# Patient Record
Sex: Male | Born: 1945
Health system: Southern US, Community
[De-identification: ages and names within clinical notes are randomized; demographics above are authoritative.]

## PROBLEM LIST (undated history)

## (undated) DIAGNOSIS — E118 Type 2 diabetes mellitus with unspecified complications: Secondary | ICD-10-CM

## (undated) DIAGNOSIS — N183 Chronic kidney disease, stage 3 unspecified: Secondary | ICD-10-CM

## (undated) DIAGNOSIS — I480 Paroxysmal atrial fibrillation: Secondary | ICD-10-CM

## (undated) DIAGNOSIS — Z2239 Carrier of other specified bacterial diseases: Secondary | ICD-10-CM

## (undated) DIAGNOSIS — I679 Cerebrovascular disease, unspecified: Secondary | ICD-10-CM

## (undated) DIAGNOSIS — R9431 Abnormal electrocardiogram [ECG] [EKG]: Secondary | ICD-10-CM

## (undated) DIAGNOSIS — N411 Chronic prostatitis: Secondary | ICD-10-CM

## (undated) DIAGNOSIS — R2681 Unsteadiness on feet: Secondary | ICD-10-CM

## (undated) DIAGNOSIS — I10 Essential (primary) hypertension: Secondary | ICD-10-CM

## (undated) DIAGNOSIS — F17200 Nicotine dependence, unspecified, uncomplicated: Secondary | ICD-10-CM

## (undated) DIAGNOSIS — G8929 Other chronic pain: Secondary | ICD-10-CM

## (undated) DIAGNOSIS — N390 Urinary tract infection, site not specified: Secondary | ICD-10-CM

## (undated) DIAGNOSIS — M545 Low back pain, unspecified: Secondary | ICD-10-CM

## (undated) DIAGNOSIS — I2510 Atherosclerotic heart disease of native coronary artery without angina pectoris: Secondary | ICD-10-CM

## (undated) DIAGNOSIS — E785 Hyperlipidemia, unspecified: Secondary | ICD-10-CM

## (undated) DIAGNOSIS — G459 Transient cerebral ischemic attack, unspecified: Secondary | ICD-10-CM

## (undated) DIAGNOSIS — R5381 Other malaise: Secondary | ICD-10-CM

## (undated) HISTORY — DX: Type 2 diabetes mellitus with unspecified complications: E11.8

## (undated) HISTORY — PX: COLONOSCOPY: SHX174

## (undated) HISTORY — DX: Low back pain, unspecified: M54.50

## (undated) HISTORY — DX: Hyperlipidemia, unspecified: E78.5

## (undated) HISTORY — DX: Unsteadiness on feet: R26.81

## (undated) HISTORY — DX: Chronic kidney disease, stage 3 unspecified: N18.30

## (undated) HISTORY — DX: Carrier of other specified bacterial diseases: Z22.39

## (undated) HISTORY — DX: Urinary tract infection, site not specified: N39.0

## (undated) HISTORY — DX: Other chronic pain: G89.29

## (undated) HISTORY — DX: Other malaise: R53.81

## (undated) HISTORY — DX: Cerebrovascular disease, unspecified: I67.9

## (undated) HISTORY — DX: Essential (primary) hypertension: I10

## (undated) HISTORY — PX: CATARACT EXTRACTION: SUR2

## (undated) HISTORY — DX: Transient cerebral ischemic attack, unspecified: G45.9

## (undated) HISTORY — DX: Paroxysmal atrial fibrillation: I48.0

## (undated) HISTORY — DX: Hypercalcemia: E83.52

## (undated) HISTORY — DX: Atherosclerotic heart disease of native coronary artery without angina pectoris: I25.10

## (undated) HISTORY — DX: Nicotine dependence, unspecified, uncomplicated: F17.200

## (undated) HISTORY — DX: Chronic prostatitis: N41.1

## (undated) HISTORY — DX: Abnormal electrocardiogram (ECG) (EKG): R94.31

## (undated) HISTORY — DX: Chronic kidney disease, stage 3 (moderate): N18.3

---

## 2004-11-17 ENCOUNTER — Ambulatory Visit: Payer: Self-pay | Admitting: Gastroenterology

## 2004-11-29 HISTORY — PX: ESOPHAGOGASTRODUODENOSCOPY: SHX1529

## 2004-12-07 ENCOUNTER — Ambulatory Visit: Payer: Self-pay | Admitting: Gastroenterology

## 2004-12-20 ENCOUNTER — Ambulatory Visit: Payer: Self-pay | Admitting: Gastroenterology

## 2010-07-01 HISTORY — PX: TRANSTHORACIC ECHOCARDIOGRAM: SHX275

## 2010-07-01 HISTORY — PX: CARDIOVASCULAR STRESS TEST: SHX262

## 2010-08-09 ENCOUNTER — Encounter: Admission: RE | Admit: 2010-08-09 | Discharge: 2010-08-09 | Payer: Self-pay | Admitting: Family Medicine

## 2010-08-09 IMAGING — CT CT HEAD W/O CM
2 series · 16 of 30 positions shown, 20 images · non-contrast
Comparison: None

CLINICAL DATA: Syndrome of inappropriate ADH.  Hypertension.

CT HEAD WITHOUT CONTRAST
TECHNIQUE: Contiguous axial images were obtained from the base of
the skull through the vertex without contrast.

[Series 2: head w/o · axial · non-contrast · 0.49mm/px · z∈[+54,+183]mm · 13 of 28 slices shown, 17 images]
[im 2/28  brain]
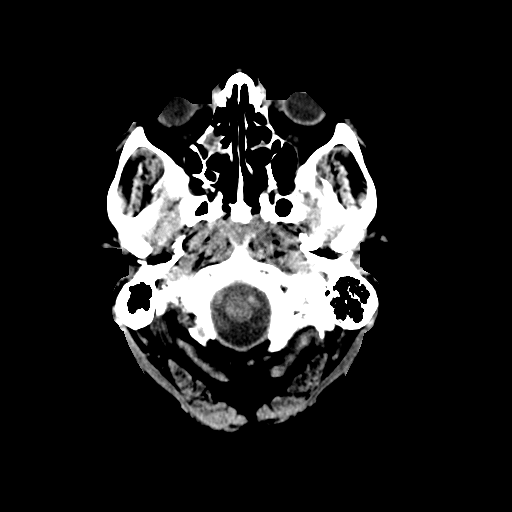
[im 2/28  bone]
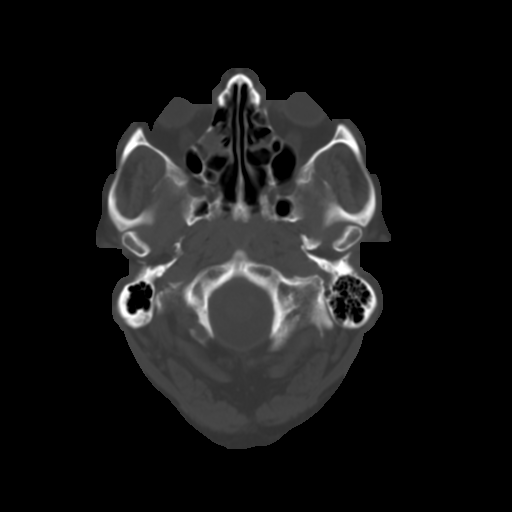
[im 4/28  brain]
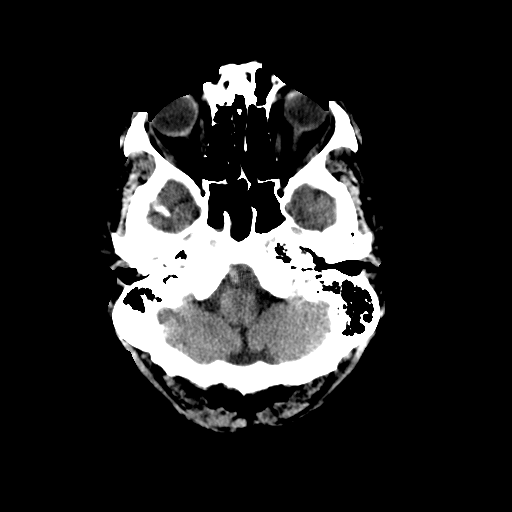
[im 6/28  brain]
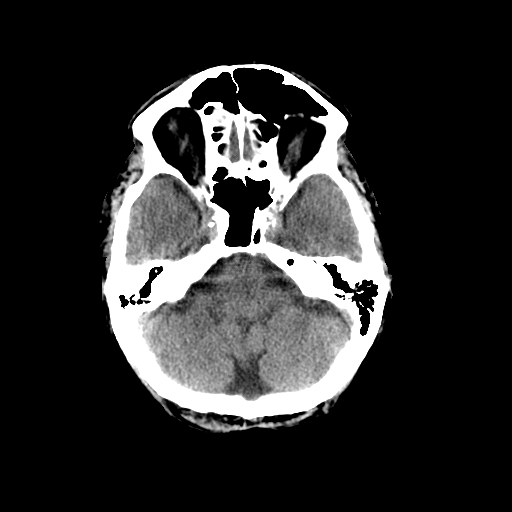
[im 8/28  brain]
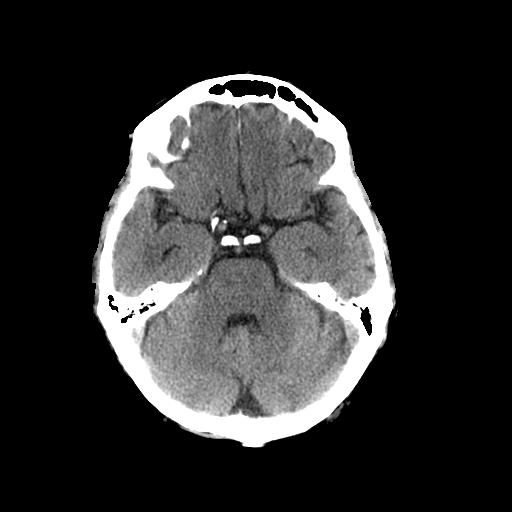
[im 10/28  brain]
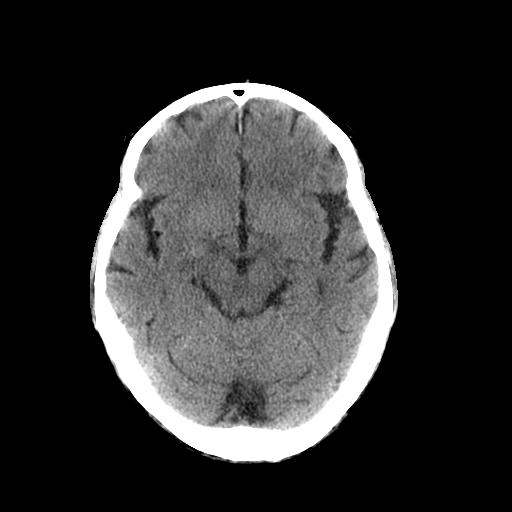
[im 10/28  bone]
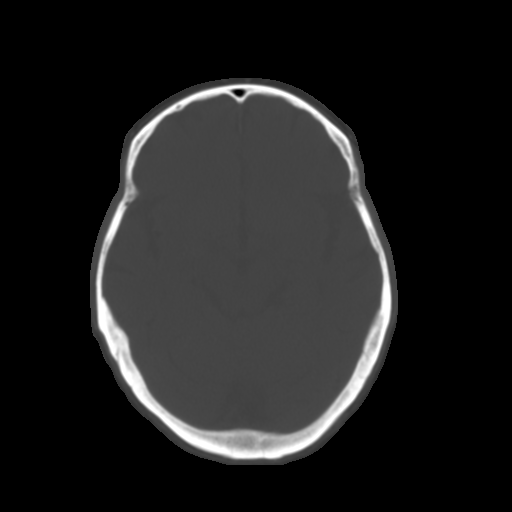
[im 12/28  brain]
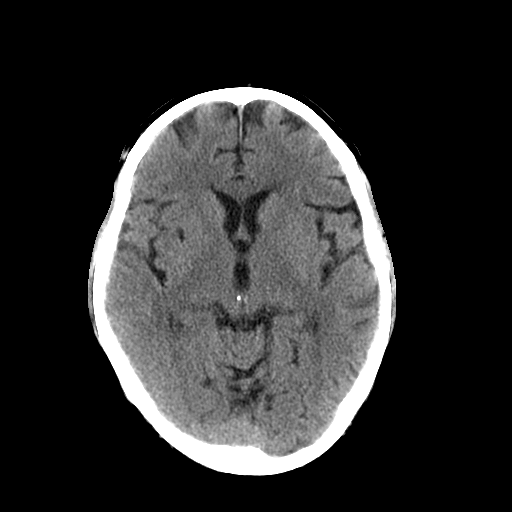
[im 14/28  brain]
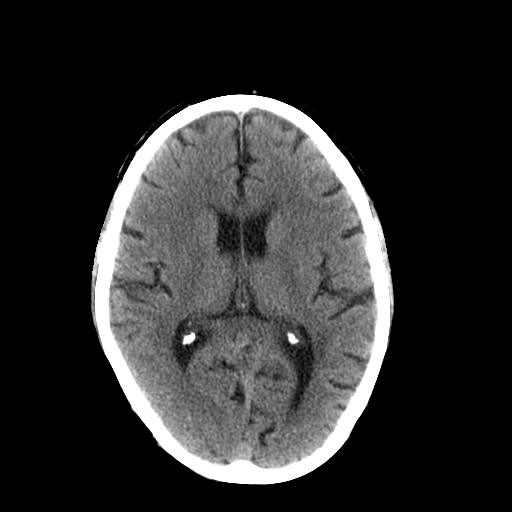
[im 16/28  brain]
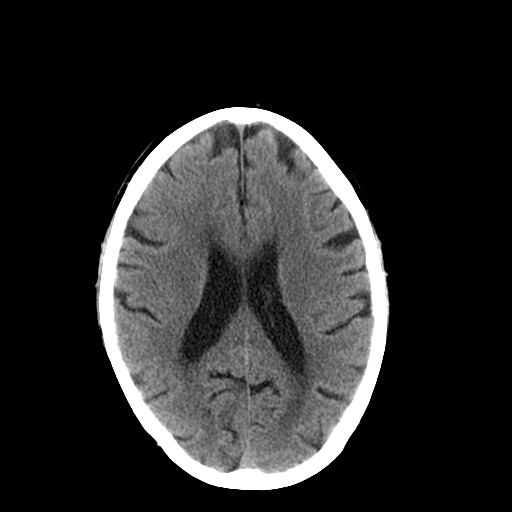
[im 18/28  brain]
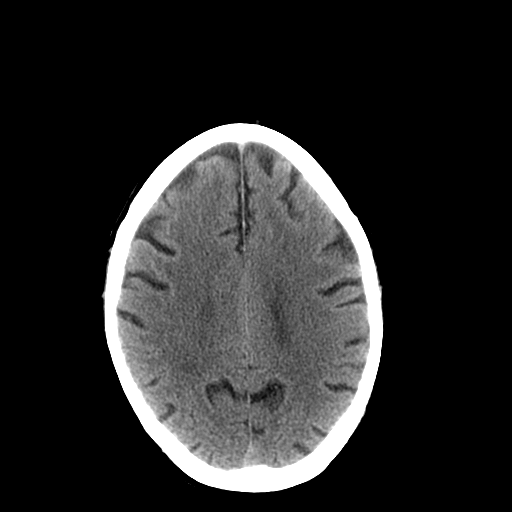
[im 18/28  bone]
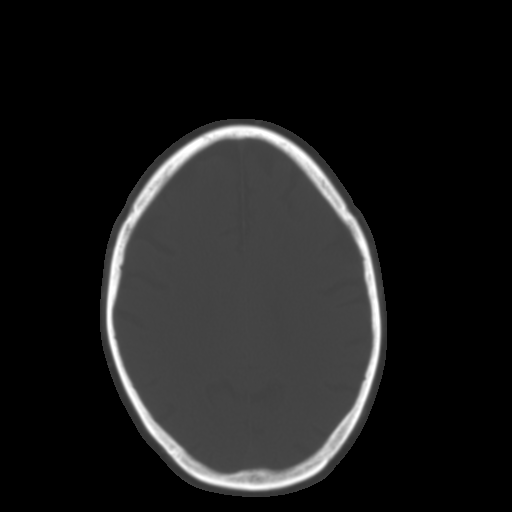
[im 20/28  brain]
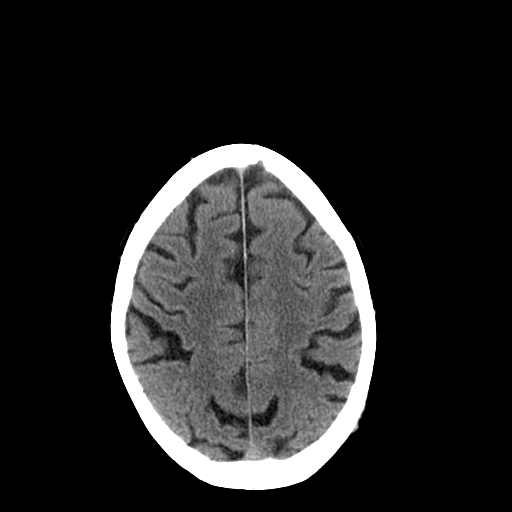
[im 22/28  brain]
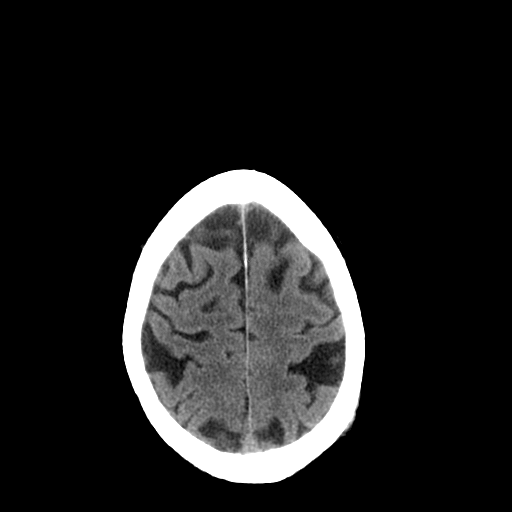
[im 24/28  brain]
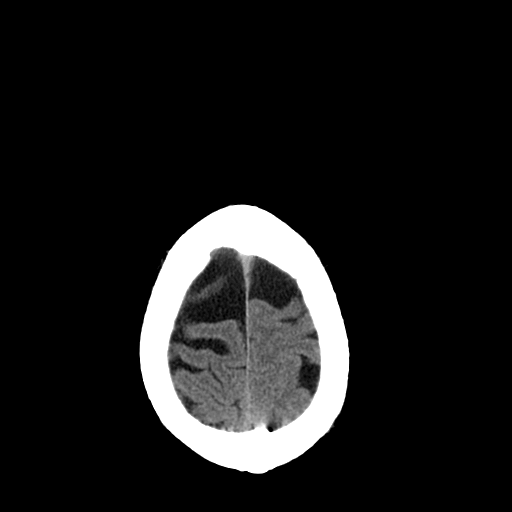
[im 26/28  brain]
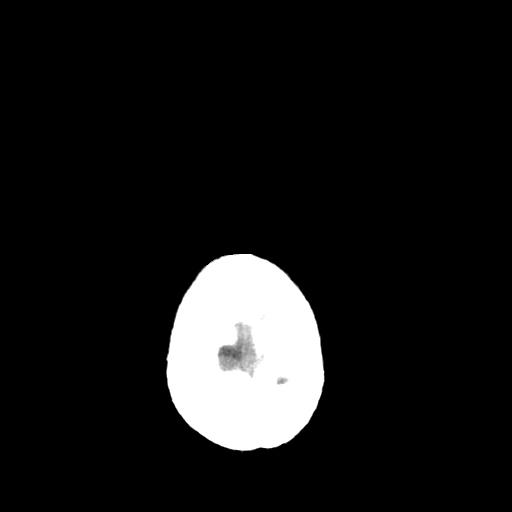
[im 26/28  bone]
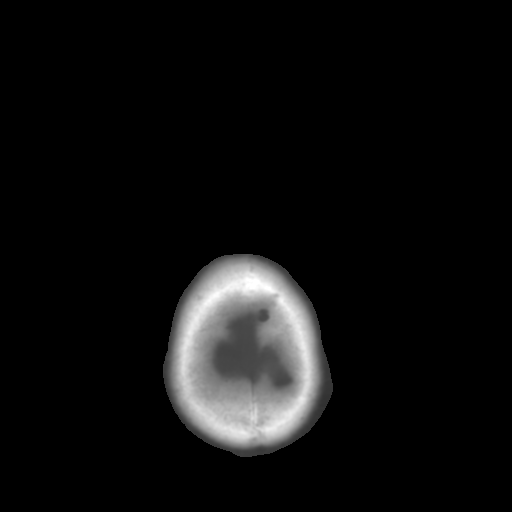

[Series 3: head bone · axial · 0.49mm/px · z∈[+54,+97]mm · 3 of 28 slices shown]
[im 2/28  bone]
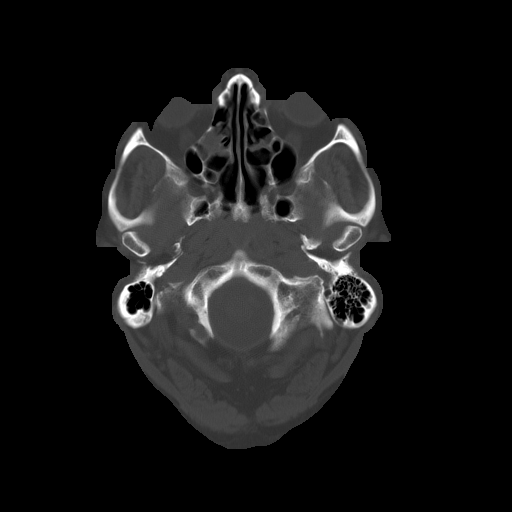
[im 6/28  bone]
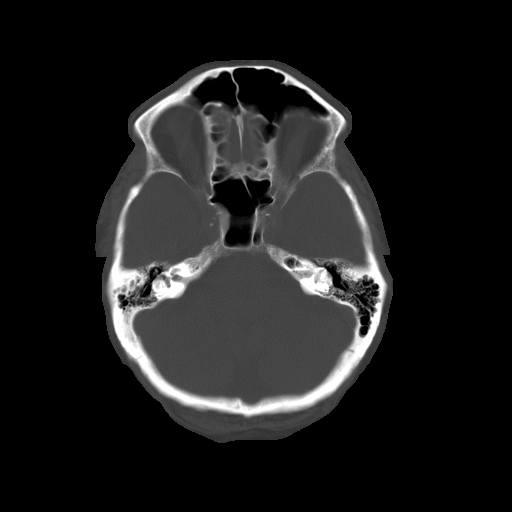
[im 10/28  bone]
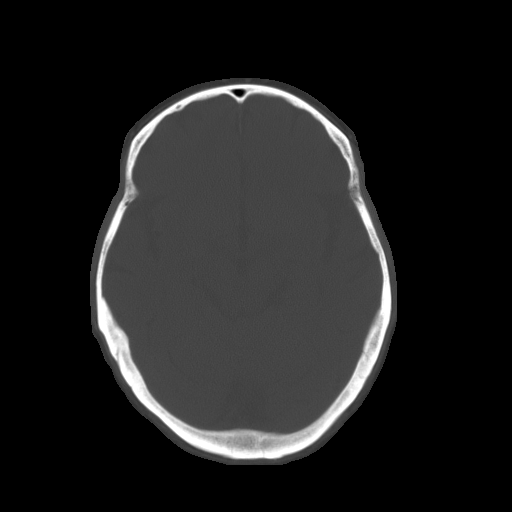

[16 of 30 positions shown; findings below may reference images not displayed]

FINDINGS: The brain shows mild generalized atrophy.  There is no
sign of acute infarction.  There are chronic appearing small vessel
insults within the deep white matter and an old lacunar infarction
in the right basal ganglia.  No cortical or large vessel territory
infarction.  No mass lesion, hemorrhage, hydrocephalus or extra-
axial collection.  There are a few opacified ethmoid air cells, but
the visualized sinuses are otherwise clear.  No fluid in the middle
ears or mastoids.  No calvarial abnormality.
IMPRESSION: No acute or significant finding.  Mild small vessel disease of the
white matter and an old lacunar infarction in the right basal
ganglia.

## 2010-08-14 ENCOUNTER — Encounter: Admission: RE | Admit: 2010-08-14 | Discharge: 2010-08-14 | Payer: Self-pay | Source: Home / Self Care

## 2011-04-24 ENCOUNTER — Ambulatory Visit: Payer: Self-pay | Admitting: Internal Medicine

## 2011-09-14 ENCOUNTER — Encounter (INDEPENDENT_AMBULATORY_CARE_PROVIDER_SITE_OTHER): Payer: PRIVATE HEALTH INSURANCE | Admitting: Family Medicine

## 2011-09-14 DIAGNOSIS — I10 Essential (primary) hypertension: Secondary | ICD-10-CM

## 2011-09-14 DIAGNOSIS — IMO0001 Reserved for inherently not codable concepts without codable children: Secondary | ICD-10-CM

## 2011-11-14 ENCOUNTER — Encounter: Payer: Self-pay | Admitting: Gastroenterology

## 2012-01-03 ENCOUNTER — Other Ambulatory Visit: Payer: Self-pay | Admitting: Family Medicine

## 2012-01-08 ENCOUNTER — Telehealth: Payer: Self-pay

## 2012-01-08 NOTE — Telephone Encounter (Signed)
Stanton Kidney from [facility] is requesting pt labs that were done in the last this pt is a referral from our office please fax all labs to [phone] atten: Stanton Kidney

## 2012-01-08 NOTE — Telephone Encounter (Signed)
Printed out message because the patient only has a paper chart has not been seen in epic.

## 2012-01-25 ENCOUNTER — Ambulatory Visit: Payer: PRIVATE HEALTH INSURANCE | Admitting: Family Medicine

## 2012-01-28 ENCOUNTER — Ambulatory Visit (INDEPENDENT_AMBULATORY_CARE_PROVIDER_SITE_OTHER): Payer: PRIVATE HEALTH INSURANCE | Admitting: Family Medicine

## 2012-01-28 ENCOUNTER — Encounter: Payer: Self-pay | Admitting: Family Medicine

## 2012-01-28 VITALS — BP 132/84 | HR 57 | Temp 98.5°F | Wt 173.0 lb

## 2012-01-28 DIAGNOSIS — I10 Essential (primary) hypertension: Secondary | ICD-10-CM

## 2012-01-28 DIAGNOSIS — E119 Type 2 diabetes mellitus without complications: Secondary | ICD-10-CM

## 2012-01-28 DIAGNOSIS — F17200 Nicotine dependence, unspecified, uncomplicated: Secondary | ICD-10-CM

## 2012-01-28 MED ORDER — METFORMIN HCL 500 MG PO TABS: 500.0000 mg | ORAL_TABLET | Freq: Every day | ORAL | Status: DC

## 2012-01-28 MED ORDER — GLIPIZIDE ER 2.5 MG PO TB24: 2.5000 mg | ORAL_TABLET | Freq: Every day | ORAL | Status: DC

## 2012-01-28 NOTE — Progress Notes (Signed)
Appt scheduled with Joyce Gross at [facility] with Dr. Rubie Maid on Feb 26, 2012 @ 3pm.  Pt is notified.

## 2012-01-28 NOTE — Progress Notes (Signed)
Office Note 01/30/2012  CC:  Chief Complaint  Patient presents with  . Establish Care    diabetes, refill meds    HPI:  Antonio Serrano is a 66 y.o. White male who is here to establish care. Patient's most recent primary MD: Dr. Meredith Staggers at [facility] in [place].   Nephrologist is Dr. Briant Cedar at [facility] in [place].  Cardiologist is [facility] in [place]. Old records were not reviewed prior to or during today's visit.  No acute complaints.  Simply wants to come here for convenience to his home plus he was finding it difficult to get desired f/u with his MD at [facility]. Says he sees nephrologist for the last 1-2 years, last labs about 20 days ago.  Says kidney function is normal and says he sees them for help with his HTN.  Last visit 3 wks ago they added carvedilol 6.25mg  bid.    He has seen [facility] in the past: sounds like he was referred for risk stratification --asymptomatic.  He describes a stress test and echo being done and he reports that these were normal and he has not seen them in about 27mo.  Says the MD he saw has moved to a different job and he'll be reassigned to different MD if f/u needed.    Does not check glucose with any regularity, cannot recall any numbers to tell me.   He is active at his job and not sedentary at home but doesn't do any brisk CV exercise. He smokes about 1/2 pack per day, not currently contemplating quitting.   Past Medical History  Diagnosis Date  . HTN (hypertension)   . Diabetes mellitus   . Tobacco dependence     Past Surgical History  Procedure Date  . Colonoscopy 12/20/04    Normal.  (due for f/u 2014)  . Cataract extraction 2007, 2008    Bilateral ([facility] on [address].  . Esophagogastroduodenoscopy 11/2004    esoph stricture/dilation    Family History  Problem Relation Age of Onset  . Dementia Mother   . Pneumonia Father     died in 70's of pneumonia, was otherwise healthy  . Cancer  Sister     colon cancer.  Died age 24  . Diabetes Paternal Aunt     History   Social History  . Marital Status: Married    Spouse Name: N/A    Number of Children: N/A  . Years of Education: N/A   Occupational History  . Not on file.   Social History Main Topics  . Smoking status: Current Everyday Smoker -- 0.5 packs/day    Types: Cigarettes  . Smokeless tobacco: Never Used  . Alcohol Use: Yes  . Drug Use: No  . Sexually Active: Not on file   Other Topics Concern  . Not on file   Social History Narrative   Married, 4 grown children, 6 grandchildren.Works in Production designer, theatre/television/film at [employer] in [place], [place].  Worked in [employer] all his life.Lives in [place].25 pack-yr tobacco hx--current as of 01/28/12.Drinks about a six pack per week.  Enjoys golf--six handicap at one point.  No formal exercise.Walks a lot for his job.    Outpatient Encounter Prescriptions as of 01/28/2012  Medication Sig Dispense Refill  . amLODipine (NORVASC) 10 MG tablet Take 10 mg by mouth daily.      Marland Kitchen aspirin EC 81 MG tablet Take 81 mg by mouth daily.      Marland Kitchen  carvedilol (COREG) 6.25 MG tablet Take 6.25 mg by mouth 2 (two) times daily with a meal.      . furosemide (LASIX) 40 MG tablet Take 40 mg by mouth daily.      Marland Kitchen glipiZIDE (GLUCOTROL XL) 2.5 MG 24 hr tablet Take 1 tablet (2.5 mg total) by mouth daily.  90 tablet  1  . losartan (COZAAR) 100 MG tablet Take 100 mg by mouth daily.      . metFORMIN (GLUCOPHAGE) 500 MG tablet Take 1 tablet (500 mg total) by mouth daily.  90 tablet  1  . DISCONTD: glipiZIDE (GLUCOTROL XL) 2.5 MG 24 hr tablet Take 1 tablet (2.5 mg total) by mouth daily. NEEDS OFFICE VISIT FOR MORE  30 tablet  0  . DISCONTD: metFORMIN (GLUCOPHAGE) 500 MG tablet Take 500 mg by mouth daily.        No Known Allergies  ROS Review of Systems  Constitutional: Negative for fever and fatigue.  HENT: Negative for congestion and sore throat.   Eyes: Negative for visual disturbance.    Respiratory: Negative for cough, shortness of breath and wheezing.   Cardiovascular: Negative for chest pain.  Gastrointestinal: Negative for nausea and abdominal pain.  Genitourinary: Negative for dysuria.  Musculoskeletal: Negative for back pain and joint swelling.  Skin: Negative for rash.  Neurological: Negative for weakness and headaches.  Hematological: Negative for adenopathy.    PE; Blood pressure 132/84, pulse 57, temperature 98.5 F (36.9 C), temperature source Temporal, weight 173 lb (78.472 kg), SpO2 97.00%. Gen: Alert, well appearing.  Patient is oriented to person, place, time, and situation. ENT: Ears: EACs clear, normal epithelium.  TMs with good light reflex and landmarks bilaterally.  Eyes: no injection, icteris, swelling, or exudate.  EOMI, PERRLA. Nose: no drainage or turbinate edema/swelling.  No injection or focal lesion.  Mouth: lips without lesion/swelling.  Oral mucosa pink and moist.  Dentition in extensive disrepair, several teeth severely eroded, several missing. No gingival swelling.  Oropharynx without erythema, exudate, or swelling.  Neck: supple/nontender.  No LAD, mass, or TM.  Carotid pulses 2+ bilaterally, without bruits. CV: RRR, no m/r/g.   LUNGS: CTA bilat, nonlabored resps, good aeration in all lung fields. ABD: soft, NT, ND, BS normal.  No hepatospenomegaly or mass.  No bruits. EXT: no clubbing, cyanosis, or edema.    Pertinent labs:  None today  ASSESSMENT AND PLAN:   NEW PT: obtain old records.  HTN (hypertension), benign Problem stable.  Continue current medications and diet appropriate for this condition.  We have reviewed our general long term plan for this problem and also reviewed symptoms and signs that should prompt the patient to call or return to the office. Will obtain records (particularly recent labs) from [facility].   Type II or unspecified type diabetes mellitus without mention of complication, not stated as  uncontrolled Unsure of control, pt not monitoring gluc with any regularity. Will obtain old records from [facility] to see latest HbA1c, urine microalb/cr, lipids, etc. He reports having been given pneumovax, tetanus, and flu vaccines there but we'll await old records to verify dates. For now, continue metformin.  RF given today.  Will need to be sure his Cr has been ok when reviewing old nephrology/[facility] records.  Tobacco dependence Spent 3-10 minutes today discussing pt's smoking habit and the short and long term risks of smoking.  I clearly advised patient to completely quit smoking.  He is not interested in cessation aids, says  when he decides to quit he'll do it "cold [place]".    Will arrange routine cardiology f/u (appt made today before pt left the office).  He'll need to contact his eye MD for annual diab retinpthy screening ASAP.  He says he has nephrology f/u arranged for 43mo from now.  Return in about 4 months (around 05/29/2012) for f/u HTN, DM 2 (pt prefers 3:30 time spot).

## 2012-01-30 DIAGNOSIS — F17200 Nicotine dependence, unspecified, uncomplicated: Secondary | ICD-10-CM | POA: Insufficient documentation

## 2012-01-30 DIAGNOSIS — E119 Type 2 diabetes mellitus without complications: Secondary | ICD-10-CM | POA: Insufficient documentation

## 2012-01-30 DIAGNOSIS — I10 Essential (primary) hypertension: Secondary | ICD-10-CM | POA: Insufficient documentation

## 2012-01-30 NOTE — Assessment & Plan Note (Addendum)
Problem stable.  Continue current medications and diet appropriate for this condition.  We have reviewed our general long term plan for this problem and also reviewed symptoms and signs that should prompt the patient to call or return to the office. Will obtain records (particularly recent labs) from [facility].

## 2012-01-30 NOTE — Assessment & Plan Note (Signed)
Spent 3-10 minutes today discussing pt's smoking habit and the short and long term risks of smoking.  I clearly advised patient to completely quit smoking.  He is not interested in cessation aids, says when he decides to quit he'll do it "cold [place]".

## 2012-01-30 NOTE — Assessment & Plan Note (Signed)
Unsure of control, pt not monitoring gluc with any regularity. Will obtain old records from [facility] to see latest HbA1c, urine microalb/cr, lipids, etc. He reports having been given pneumovax, tetanus, and flu vaccines there but we'll await old records to verify dates. For now, continue metformin.  RF given today.  Will need to be sure his Cr has been ok when reviewing old nephrology/[facility] records.

## 2012-02-14 ENCOUNTER — Encounter: Payer: Self-pay | Admitting: Family Medicine

## 2012-03-14 ENCOUNTER — Ambulatory Visit: Payer: PRIVATE HEALTH INSURANCE | Admitting: Family Medicine

## 2012-04-11 ENCOUNTER — Telehealth: Payer: Self-pay

## 2012-04-11 NOTE — Telephone Encounter (Addendum)
LYNN FROM [facility] WOULD LIKE FOR [place] TO FAX OVER PT'S RECENT LABS. [facility]#[phone] FAX: [phone] SHE ALSO STATES THAT PT IS IN THE OFFICE NOW

## 2012-04-11 NOTE — Telephone Encounter (Signed)
I faxed over records yesterday and re faxed them today per request below.  I faxed over the most recent ov and labs to [facility] on 04/10/12 & 04/11/12.

## 2012-05-29 ENCOUNTER — Ambulatory Visit (INDEPENDENT_AMBULATORY_CARE_PROVIDER_SITE_OTHER): Payer: PRIVATE HEALTH INSURANCE | Admitting: Family Medicine

## 2012-05-29 ENCOUNTER — Encounter: Payer: Self-pay | Admitting: Family Medicine

## 2012-05-29 VITALS — BP 127/76 | HR 60 | Ht 66.5 in | Wt 175.0 lb

## 2012-05-29 DIAGNOSIS — E119 Type 2 diabetes mellitus without complications: Secondary | ICD-10-CM

## 2012-05-29 DIAGNOSIS — F17200 Nicotine dependence, unspecified, uncomplicated: Secondary | ICD-10-CM

## 2012-05-29 DIAGNOSIS — N183 Chronic kidney disease, stage 3 unspecified: Secondary | ICD-10-CM

## 2012-05-29 DIAGNOSIS — I10 Essential (primary) hypertension: Secondary | ICD-10-CM

## 2012-05-29 LAB — HEMOGLOBIN A1C
Hgb A1c MFr Bld: 6.7 % — ABNORMAL HIGH (ref <5.7)
Mean Plasma Glucose: 146 mg/dL — ABNORMAL HIGH (ref <117)

## 2012-05-29 LAB — COMPREHENSIVE METABOLIC PANEL
AST: 18 U/L (ref 0–37)
Albumin: 4.6 g/dL (ref 3.5–5.2)
Alkaline Phosphatase: 105 U/L (ref 39–117)
BUN: 18 mg/dL (ref 6–23)
Glucose, Bld: 105 mg/dL — ABNORMAL HIGH (ref 70–99)
Potassium: 4 mEq/L (ref 3.5–5.3)
Total Bilirubin: 0.3 mg/dL (ref 0.3–1.2)

## 2012-05-29 MED ORDER — FUROSEMIDE 40 MG PO TABS: 40.0000 mg | ORAL_TABLET | Freq: Every day | ORAL | Status: DC

## 2012-05-29 MED ORDER — AMLODIPINE BESYLATE 10 MG PO TABS: 10.0000 mg | ORAL_TABLET | Freq: Every day | ORAL | Status: DC

## 2012-05-29 NOTE — Progress Notes (Signed)
OFFICE NOTE  05/29/2012  CC:  Chief Complaint  Patient presents with  . Follow-up    DM, HTN     HPI: Patient is a 66 y.o. Caucasian male who is here for 4 mo f/u DM 2 and HTN. He reports normal blood glucose readings at each point in his day (checks tid sometimes) and normal bp checks at home.  Denies tingling, numbness, or pain in feet. He has some slight stiffness and aches in hands and back, asks for something he can take to help this so he can swing without as much discomfort. He otherwise states he feels excellent.  He has routine f/u scheduled with Dr. Briant Cedar in nephrology next month.  He saw the cardiologist for routine f/u in the last month or two and this went well, no changes, f/u planned annually.  Pertinent PMH:  Past Medical History  Diagnosis Date  . HTN (hypertension)     Renal/aortic doppler u/s normal 06/2010  . Diabetes mellitus Dx'd fall 2011  . Tobacco dependence   . Nonspecific abnormal electrocardiogram (ECG) (EKG) Fall 2011    TWI in inferior leads: myocardial perfusion scan neg and echo normal 07/2010  . Chronic renal insufficiency, stage III (moderate)     baselin Cr 1.8 as of 07/2010    MEDS:  Outpatient Prescriptions Prior to Visit  Medication Sig Dispense Refill  . amLODipine (NORVASC) 10 MG tablet Take 10 mg by mouth daily.      Marland Kitchen aspirin EC 81 MG tablet Take 81 mg by mouth daily.      . carvedilol (COREG) 6.25 MG tablet Take 6.25 mg by mouth 2 (two) times daily with a meal.      . furosemide (LASIX) 40 MG tablet Take 40 mg by mouth daily.      Marland Kitchen glipiZIDE (GLUCOTROL XL) 2.5 MG 24 hr tablet Take 1 tablet (2.5 mg total) by mouth daily.  90 tablet  1  . losartan (COZAAR) 100 MG tablet Take 100 mg by mouth daily.      . metFORMIN (GLUCOPHAGE) 500 MG tablet Take 1 tablet (500 mg total) by mouth daily.  90 tablet  1    PE: Blood pressure 127/76, pulse 60, height 5' 6.5" (1.689 m), weight 175 lb (79.379 kg). Gen: Alert, well appearing.  Patient is  oriented to person, place, time, and situation. CV: RRR, no m/r/g.   LUNGS: CTA bilat, nonlabored resps, good aeration in all lung fields. EXT: no clubbing, cyanosis, or edema.  Feet: no calluses.  DP and PT pulses 2+ bilat.  No erythema or skin breakdown. Monofilament testing reveals normal sensation in all areas bilaterally.  IMPRESSION AND PLAN:  Type II or unspecified type diabetes mellitus without mention of complication, not stated as uncontrolled Problem stable.  Continue current medications and diet appropriate for this condition.  We have reviewed our general long term plan for this problem and also reviewed symptoms and signs that should prompt the patient to call or return to the office. Check HbA1c today, urine microalb/cr today, CMET today. I recommended he call his ophthalmologist [facility]) and schedule a diab retinpthy screening exam. Foot exam normal here today.  HTN (hypertension), benign Problem stable.  Continue current medications and diet appropriate for this condition.  We have reviewed our general long term plan for this problem and also reviewed symptoms and signs that should prompt the patient to call or return to the office.   Tobacco dependence He continues to work slowly at cutting  back.  Chronic renal insufficiency, stage III (moderate) Baseline Cr 1.8 as of 07/2010. Check labs today.  He has f/u with nephrologist next month.  Will obtain nephrologist's records.     FOLLOW UP: 4 mo

## 2012-05-29 NOTE — Assessment & Plan Note (Signed)
Problem stable.  Continue current medications and diet appropriate for this condition.  We have reviewed our general long term plan for this problem and also reviewed symptoms and signs that should prompt the patient to call or return to the office. Check HbA1c today, urine microalb/cr today, CMET today. I recommended he call his ophthalmologist [facility]) and schedule a diab retinpthy screening exam. Foot exam normal here today.

## 2012-05-29 NOTE — Assessment & Plan Note (Signed)
Baseline Cr 1.8 as of 07/2010. Check labs today.  He has f/u with nephrologist next month.  Will obtain nephrologist's records.

## 2012-05-29 NOTE — Assessment & Plan Note (Signed)
He continues to work slowly at cutting back.

## 2012-05-29 NOTE — Patient Instructions (Addendum)
Wait for our callback: if we say ok, then start celebrex 200 mg once daily for arthritis pain.

## 2012-05-29 NOTE — Assessment & Plan Note (Signed)
Problem stable.  Continue current medications and diet appropriate for this condition.  We have reviewed our general long term plan for this problem and also reviewed symptoms and signs that should prompt the patient to call or return to the office.  

## 2012-05-30 LAB — MICROALBUMIN / CREATININE URINE RATIO
Creatinine, Urine: 179.7 mg/dL
Microalb Creat Ratio: 6.2 mg/g (ref 0.0–30.0)
Microalb, Ur: 1.11 mg/dL (ref 0.00–1.89)

## 2012-06-05 ENCOUNTER — Other Ambulatory Visit: Payer: Self-pay | Admitting: Family Medicine

## 2012-06-05 ENCOUNTER — Encounter: Payer: Self-pay | Admitting: Family Medicine

## 2012-06-05 MED ORDER — GLIPIZIDE ER 5 MG PO TB24: 5.0000 mg | ORAL_TABLET | Freq: Every day | ORAL | Status: DC

## 2012-06-05 MED ORDER — TRAMADOL HCL 50 MG PO TABS: 50.0000 mg | ORAL_TABLET | Freq: Four times a day (QID) | ORAL | Status: DC | PRN

## 2012-07-02 ENCOUNTER — Other Ambulatory Visit: Payer: Self-pay | Admitting: Family Medicine

## 2012-07-02 NOTE — Telephone Encounter (Signed)
eScribe request for refill on METFORMIN Last filled - 12/2011, #90 X 1 Last seen on - 05/29/12 Follow up - 4 MONTHS RX sent

## 2012-09-01 ENCOUNTER — Other Ambulatory Visit: Payer: Self-pay | Admitting: Family Medicine

## 2012-09-01 NOTE — Telephone Encounter (Signed)
Chart pulled to PA pool at nurses station [phone]

## 2012-09-08 ENCOUNTER — Other Ambulatory Visit: Payer: Self-pay | Admitting: Family Medicine

## 2012-09-09 ENCOUNTER — Other Ambulatory Visit: Payer: Self-pay | Admitting: Family Medicine

## 2012-09-09 NOTE — Telephone Encounter (Signed)
eScribe request for refill on TRAMADOL Last filled - 06/05/29, 30 X 0 Last seen on - 05/29/12 Follow up - 09/29/12 Please advise refills.

## 2012-09-10 MED ORDER — LOSARTAN POTASSIUM 100 MG PO TABS: 100.0000 mg | ORAL_TABLET | Freq: Every day | ORAL | Status: DC

## 2012-09-10 NOTE — Telephone Encounter (Signed)
Pt called stating that med is not at pharmacy.  Advised that I did not see this note and did not know he needed refill.  Apologized to patient.  Advised that Tramadol was filled yesterday and should be at pharmacy.  Pt states he called and tramadol is there, but losartan is not. Pt has follow up on 09/29/12. RX sent.

## 2012-09-29 ENCOUNTER — Ambulatory Visit (INDEPENDENT_AMBULATORY_CARE_PROVIDER_SITE_OTHER): Payer: PRIVATE HEALTH INSURANCE | Admitting: Family Medicine

## 2012-09-29 ENCOUNTER — Encounter: Payer: Self-pay | Admitting: Family Medicine

## 2012-09-29 VITALS — BP 134/83 | HR 62 | Ht 66.5 in | Wt 177.0 lb

## 2012-09-29 DIAGNOSIS — F17200 Nicotine dependence, unspecified, uncomplicated: Secondary | ICD-10-CM

## 2012-09-29 DIAGNOSIS — N183 Chronic kidney disease, stage 3 unspecified: Secondary | ICD-10-CM

## 2012-09-29 DIAGNOSIS — E119 Type 2 diabetes mellitus without complications: Secondary | ICD-10-CM

## 2012-09-29 DIAGNOSIS — I10 Essential (primary) hypertension: Secondary | ICD-10-CM

## 2012-09-29 NOTE — Assessment & Plan Note (Signed)
Last f/u w/ Dr. Briant Cedar was fine. Cr at that visit was 1.53, lytes wnl. D/c metformin due to Cr persistently > 1.5.

## 2012-09-29 NOTE — Assessment & Plan Note (Signed)
He is cutting back but I encouraged complete cessation today.

## 2012-09-29 NOTE — Progress Notes (Signed)
OFFICE NOTE  09/29/2012  CC:  Chief Complaint  Patient presents with  . Follow-up    DM, HTN     HPI: Patient is a 66 y.o. Caucasian male who is here for 4 mo f/u DM 2, HTN, and CRI. After last visit we communicated to him by phone that his Cr was too high to continue metformin but apparently he did not understand b/c he was still taking it when he saw Dr. Briant Cedar, his nephrologist, on 07/15/12.  He remains on this med now, but tells me Dr. Briant Cedar suggested it be d/c'd but he wanted him to discuss it with me today.  Fasting and 2H PP sugars within goal parameters per his report today. He says he feels well.  Checks BP daily: 120s/79s usually, HR usually 70s. He is cutting back on cigarettes: about 1/2 carton per week now.  He has occasional back ache and tramadol prn helps for this.  He is not over-using this med.   Pertinent PMH:  Past Medical History  Diagnosis Date  . HTN (hypertension)     Renal/aortic doppler u/s normal 06/2010  . Diabetes mellitus Dx'd fall 2011  . Tobacco dependence   . Nonspecific abnormal electrocardiogram (ECG) (EKG) Fall 2011    TWI in inferior leads: myocardial perfusion scan neg and echo normal 07/2010  . Chronic renal insufficiency, stage III (moderate)     baselin Cr 1.5-1.8 as of 06/2012   Past surgical, social, and family history reviewed and no changes noted since last office visit.  MEDS:  Outpatient Prescriptions Prior to Visit  Medication Sig Dispense Refill  . amLODipine (NORVASC) 10 MG tablet Take 1 tablet (10 mg total) by mouth daily.  30 tablet  6  . aspirin EC 81 MG tablet Take 81 mg by mouth daily.      . carvedilol (COREG) 6.25 MG tablet Take 6.25 mg by mouth 2 (two) times daily with a meal.      . furosemide (LASIX) 40 MG tablet Take 1 tablet (40 mg total) by mouth daily.  30 tablet  6  . glipiZIDE (GLUCOTROL XL) 5 MG 24 hr tablet Take 1 tablet (5 mg total) by mouth daily.  90 tablet  1  . losartan (COZAAR) 100 MG tablet  Take 1 tablet (100 mg total) by mouth daily.  30 tablet  0  . metFORMIN (GLUCOPHAGE) 500 MG tablet TAKE ONE TABLET BY MOUTH EVERY DAY  90 tablet  1  . traMADol (ULTRAM) 50 MG tablet TAKE ONE TO TWO TABLETS BY MOUTH EVERY 6 HOURS AS NEEDED FOR PAIN  30 tablet  0   Last reviewed on 09/29/2012  4:12 PM by Jeoffrey Massed, MD  PE: Blood pressure 134/83, pulse 62, height 5' 6.5" (1.689 m), weight 177 lb (80.287 kg). Gen: Alert, well appearing.  Patient is oriented to person, place, time, and situation. No further exam today.  IMPRESSION AND PLAN:  Type II or unspecified type diabetes mellitus without mention of complication, not stated as uncontrolled Home glucoses good. D/C metformin due to persistent Cr >1.5 He'll monitor fasting and one 2H PP glucose every day for 1 mo and bring these numbers in to the office for review to see if removal of metformin is making a signif diff. No labs today.  HTN (hypertension), benign Problem stable.  Continue current medications and diet appropriate for this condition.  We have reviewed our general long term plan for this problem and also reviewed symptoms and signs that  should prompt the patient to call or return to the office.   Chronic renal insufficiency, stage III (moderate) Last f/u w/ Dr. Briant Cedar was fine. Cr at that visit was 1.53, lytes wnl. D/c metformin due to Cr persistently > 1.5.  Tobacco dependence He is cutting back but I encouraged complete cessation today.   An After Visit Summary was printed and given to the patient.  FOLLOW UP: 4 mo

## 2012-09-29 NOTE — Assessment & Plan Note (Signed)
Home glucoses good. D/C metformin due to persistent Cr >1.5 He'll monitor fasting and one 2H PP glucose every day for 1 mo and bring these numbers in to the office for review to see if removal of metformin is making a signif diff. No labs today.

## 2012-09-29 NOTE — Patient Instructions (Signed)
Check sugar every morning and 2 hours after supper every evening and write them down and bring back to office for nurse in 1 month.

## 2012-09-29 NOTE — Assessment & Plan Note (Signed)
Problem stable.  Continue current medications and diet appropriate for this condition.  We have reviewed our general long term plan for this problem and also reviewed symptoms and signs that should prompt the patient to call or return to the office.  

## 2012-10-13 ENCOUNTER — Other Ambulatory Visit: Payer: Self-pay | Admitting: *Deleted

## 2012-10-13 MED ORDER — LOSARTAN POTASSIUM 100 MG PO TABS: 100.0000 mg | ORAL_TABLET | Freq: Every day | ORAL | Status: DC

## 2012-10-13 NOTE — Telephone Encounter (Signed)
Refill request for LOSARTAN Last seen - 09/29/13 Refill sent per [facility] refill protocol.

## 2012-11-06 ENCOUNTER — Telehealth: Payer: Self-pay | Admitting: Family Medicine

## 2012-11-06 NOTE — Telephone Encounter (Signed)
I forgot to add: he should take his glipizide at breakfast time, not noon.  No dosage change needed.-thx

## 2012-11-06 NOTE — Telephone Encounter (Signed)
Pls call pt and notify him that I reviewed his blood pressures, heart rates, and sugar readings that he dropped off.   I think his BP and HR look excellent.  His sugars are good for the most part, so I don't recommend any new diabetic med at this time. We'll recheck his HbA1c at next f/u visit to get more info to go on, but just continue trying to eat a diabetic diet and be as physically active as possible and take glipizide as he is doing. He can cut his glucose monitoring back to 3 days per week (fasting and 2 H postprandial).   He can cut his bp checks back to once a week.-thx

## 2012-11-06 NOTE — Telephone Encounter (Signed)
Wife notified per DPR.  Advised OK to call back with any questions.

## 2012-12-12 ENCOUNTER — Other Ambulatory Visit: Payer: Self-pay | Admitting: *Deleted

## 2012-12-12 MED ORDER — GLIPIZIDE ER 5 MG PO TB24: 5.0000 mg | ORAL_TABLET | Freq: Every day | ORAL | Status: DC

## 2012-12-12 NOTE — Telephone Encounter (Signed)
Faxed refill request received from pharmacy for GLIPIZIDE Last filled by MD on 06/05/12, #90 X 1 Last seen on 09/29/12 Follow up 4 MONTHS. Refill sent per [facility] refill protocol.

## 2012-12-17 ENCOUNTER — Telehealth: Payer: Self-pay | Admitting: Family Medicine

## 2012-12-17 MED ORDER — FUROSEMIDE 40 MG PO TABS: 40.0000 mg | ORAL_TABLET | Freq: Every day | ORAL | Status: DC

## 2012-12-17 MED ORDER — AMLODIPINE BESYLATE 10 MG PO TABS: 10.0000 mg | ORAL_TABLET | Freq: Every day | ORAL | Status: DC

## 2012-12-17 MED ORDER — GLIPIZIDE ER 5 MG PO TB24: 5.0000 mg | ORAL_TABLET | Freq: Every day | ORAL | Status: DC

## 2012-12-17 NOTE — Telephone Encounter (Signed)
Rx[s] to pharmacy/[facility] 

## 2012-12-17 NOTE — Telephone Encounter (Signed)
Please also check to make sure the pharmacy has the refill for the Glipizide. Patient states they didn't have it 12/13/12

## 2013-01-28 ENCOUNTER — Ambulatory Visit (INDEPENDENT_AMBULATORY_CARE_PROVIDER_SITE_OTHER): Payer: PRIVATE HEALTH INSURANCE | Admitting: Family Medicine

## 2013-01-28 ENCOUNTER — Encounter: Payer: Self-pay | Admitting: Family Medicine

## 2013-01-28 VITALS — BP 133/79 | HR 64 | Temp 98.0°F | Resp 16 | Wt 179.8 lb

## 2013-01-28 DIAGNOSIS — E119 Type 2 diabetes mellitus without complications: Secondary | ICD-10-CM

## 2013-01-28 DIAGNOSIS — F17200 Nicotine dependence, unspecified, uncomplicated: Secondary | ICD-10-CM

## 2013-01-28 DIAGNOSIS — I10 Essential (primary) hypertension: Secondary | ICD-10-CM

## 2013-01-28 DIAGNOSIS — N183 Chronic kidney disease, stage 3 unspecified: Secondary | ICD-10-CM

## 2013-01-28 NOTE — Assessment & Plan Note (Signed)
Stable. Obtain nephrology records from latest visit/labs.

## 2013-01-28 NOTE — Assessment & Plan Note (Signed)
Problem stable.  Continue current medications and diet appropriate for this condition.  We have reviewed our general long term plan for this problem and also reviewed symptoms and signs that should prompt the patient to call or return to the office.  

## 2013-01-28 NOTE — Assessment & Plan Note (Addendum)
Lab Results  Component Value Date   HGBA1C 6.7* 05/29/2012   Problem stable.  Continue current medications and diet appropriate for this condition.  We have reviewed our general long term plan for this problem and also reviewed symptoms and signs that should prompt the patient to call or return to the office. Will ask Dr. Edd Arbour office for latest labs/office note.

## 2013-01-28 NOTE — Assessment & Plan Note (Signed)
Encouraged complete cessation but he is not contemplating it at this time.

## 2013-01-28 NOTE — Progress Notes (Signed)
OFFICE NOTE  01/28/2013  CC:  Chief Complaint  Patient presents with  . Follow-up    4-mth [DM; HTN; CRI stage III]     HPI: Patient is a 67 y.o. Caucasian male who is here for 4 mo f/u DM 2, HTN, CRI stage III.1 Feeling well. Glucose fasting avg 90 or so, occ 2H PP check around 140.  Says he gets occ fasting gluc in 60s but denies any symptoms of hypoglycemia.  Takes his glipizide XL 5mg  every morning at 9 AM. He went to his nephrologist 2d/a and got labs done, including A1c per pt. Reports home bp is 120s-130s over 70s-80s.  HR 60s.  He still smokes 1/2 pack per day and is still trying to cut back, not contemplating total cessation at this time, declines offer of med to help with this.  He is not doing any formal exercise but since the weather is warming up he'll start playing golf more, but he rides in a cart when he plays.    ROS: no CP, no SOB, no cough, no melena, no rashes, no HA's or dizziness  Pertinent PMH:  Past Medical History  Diagnosis Date  . HTN (hypertension)     Renal/aortic doppler u/s normal 06/2010  . Diabetes mellitus Dx'd fall 2011  . Tobacco dependence   . Nonspecific abnormal electrocardiogram (ECG) (EKG) Fall 2011    TWI in inferior leads: myocardial perfusion scan neg and echo normal 07/2010  . Chronic renal insufficiency, stage III (moderate)     baselin Cr 1.5-1.8 as of 06/2012   Past Surgical History  Procedure Laterality Date  . Colonoscopy  12/20/04    Normal.  (due for f/u 2014)  . Cataract extraction  2007, 2008    Bilateral ([facility] on [address].  . Esophagogastroduodenoscopy  11/2004    esoph stricture/dilation  . Cardiovascular stress test  07/2010    Myocardial perfusion scan neg/low risk.  . Transthoracic echocardiogram  07/2010    Normal    MEDS:  Outpatient Prescriptions Prior to Visit  Medication Sig Dispense Refill  . amLODipine (NORVASC) 10 MG tablet Take 1 tablet (10 mg total) by mouth daily.  30  tablet  6  . aspirin EC 81 MG tablet Take 81 mg by mouth daily.      . carvedilol (COREG) 6.25 MG tablet Take 6.25 mg by mouth 2 (two) times daily with a meal.      . furosemide (LASIX) 40 MG tablet Take 1 tablet (40 mg total) by mouth daily.  30 tablet  6  . glipiZIDE (GLUCOTROL XL) 5 MG 24 hr tablet Take 1 tablet (5 mg total) by mouth daily.  90 tablet  1  . losartan (COZAAR) 100 MG tablet Take 1 tablet (100 mg total) by mouth daily.  30 tablet  5  . traMADol (ULTRAM) 50 MG tablet TAKE ONE TO TWO TABLETS BY MOUTH EVERY 6 HOURS AS NEEDED FOR PAIN  30 tablet  0   No facility-administered medications prior to visit.    PE: Blood pressure 133/79, pulse 64, temperature 98 F (36.7 C), temperature source Oral, resp. rate 16, weight 179 lb 12 oz (81.534 kg), SpO2 96.00%. Gen: Alert, well appearing.  Patient is oriented to person, place, time, and situation. ENT: Eyes: no injection, icteris, swelling, or exudate.  EOMI, PERRLA. Nose: no drainage or turbinate edema/swelling.  No injection or focal lesion.  Mouth: lips without lesion/swelling.  Oral mucosa pink and moist.  Dentition in disrepair  but no sign of acute infection.  Oropharynx without erythema, exudate, or swelling.  Neck: supple/nontender.  No LAD, mass, or TM.  Carotid pulses 2+ bilaterally, without bruits. CV: RRR, no m/r/g.   LUNGS: CTA bilat, nonlabored resps, good aeration in all lung fields.  IMPRESSION AND PLAN:  Type II or unspecified type diabetes mellitus without mention of complication, not stated as uncontrolled Lab Results  Component Value Date   HGBA1C 6.7* 05/29/2012   Problem stable.  Continue current medications and diet appropriate for this condition.  We have reviewed our general long term plan for this problem and also reviewed symptoms and signs that should prompt the patient to call or return to the office. Will ask Dr. Edd Arbour office for latest labs/office note.  HTN (hypertension), benign Problem  stable.  Continue current medications and diet appropriate for this condition.  We have reviewed our general long term plan for this problem and also reviewed symptoms and signs that should prompt the patient to call or return to the office.   Chronic renal insufficiency, stage III (moderate) Stable. Obtain nephrology records from latest visit/labs.  Tobacco dependence Encouraged complete cessation but he is not contemplating it at this time.   An After Visit Summary was printed and given to the patient.   FOLLOW UP: 66mo -fasting visit so we can do his annual cholesterol check.  I need to offer Tdap and pneumovax at next f/u. Also, will encourage annual diabetic retinopathy eye screening exam.

## 2013-01-29 ENCOUNTER — Telehealth: Payer: Self-pay | Admitting: Family Medicine

## 2013-01-29 NOTE — Telephone Encounter (Signed)
AVS 04.30.14: Follow-Up  Return in about 4 months (around 05/30/2013) for Pls give pt 8AM appt (fasting).  Please advise.

## 2013-01-29 NOTE — Telephone Encounter (Signed)
He stated that he is suppose to follow up with Dr. Milinda Cave because of the elevated BSL but he was just seen yesterday.

## 2013-01-30 NOTE — Telephone Encounter (Signed)
I got the notes and labs from his kidney doctor and they are fine.  His avg sugar is the same as what it was last fall (HbA1c 6.7%). He is doing very well with his diabetes at this time.  Reassure him. Keep f/u in 67mo as previously planned-thx

## 2013-01-30 NOTE — Telephone Encounter (Signed)
Patient is aware and will f/u in 4 months

## 2013-02-18 ENCOUNTER — Encounter: Payer: Self-pay | Admitting: Nephrology

## 2013-04-06 ENCOUNTER — Other Ambulatory Visit: Payer: Self-pay | Admitting: Family Medicine

## 2013-04-06 MED ORDER — LOSARTAN POTASSIUM 100 MG PO TABS: 100.0000 mg | ORAL_TABLET | Freq: Every day | ORAL | Status: DC

## 2013-04-09 ENCOUNTER — Encounter: Payer: Self-pay | Admitting: Family Medicine

## 2013-04-09 ENCOUNTER — Ambulatory Visit (INDEPENDENT_AMBULATORY_CARE_PROVIDER_SITE_OTHER): Payer: PRIVATE HEALTH INSURANCE | Admitting: Family Medicine

## 2013-04-09 ENCOUNTER — Other Ambulatory Visit: Payer: Self-pay

## 2013-04-09 VITALS — BP 143/77 | HR 94 | Temp 98.0°F | Resp 18 | Ht 65.5 in | Wt 182.0 lb

## 2013-04-09 DIAGNOSIS — R319 Hematuria, unspecified: Secondary | ICD-10-CM

## 2013-04-09 DIAGNOSIS — N3000 Acute cystitis without hematuria: Secondary | ICD-10-CM

## 2013-04-09 DIAGNOSIS — R300 Dysuria: Secondary | ICD-10-CM

## 2013-04-09 LAB — POCT URINALYSIS DIPSTICK
Nitrite, UA: NEGATIVE
Protein, UA: NEGATIVE
Urobilinogen, UA: 0.2
pH, UA: 6.5

## 2013-04-09 MED ORDER — SULFAMETHOXAZOLE-TMP DS 800-160 MG PO TABS: 1.0000 | ORAL_TABLET | Freq: Two times a day (BID) | ORAL | Status: DC

## 2013-04-09 NOTE — Assessment & Plan Note (Signed)
Sent urine for c/s.  Started bactrim DS 1 bid x 5d. Discussed the fact that this is an unusual dx in a male, esp since he doesn't seem to have any obstructive-type urinary complaints.   He says he has had one other similar UTI in the past a few years ago, dx'd by his nephrologist, Dr. Briant Cedar.

## 2013-04-09 NOTE — Progress Notes (Signed)
OFFICE NOTE  04/09/2013  CC:  Chief Complaint  Patient presents with  . Dysuria  . Hematuria     HPI: Patient is a 67 y.o. Caucasian male who is here for question of UTI.   Describes onset of urinary urgency, frequency, and dysuria about 3 days ago.  Had a brief period of right flank/side pain a day or so into the illness and then this spontaneously resolved. No fever.  No n/v.  No abd pains.  Denies rectal/anal/perineum pain.  Says he has been told her has BPH but says he has no chronic urinary sx's such as urinary frequency, urgency, hesitancy, poor stream, incomplete emptying, or nocturia.  No hx of prostatitis. He reports normal fasting and postprandial glucoses lately.  Normal home bps.  Pertinent PMH:  Past Medical History  Diagnosis Date  . HTN (hypertension)     Renal/aortic doppler u/s normal 06/2010  . Diabetes mellitus Dx'd fall 2011  . Tobacco dependence   . Nonspecific abnormal electrocardiogram (ECG) (EKG) Fall 2011    TWI in inferior leads: myocardial perfusion scan neg and echo normal 07/2010  . Chronic renal insufficiency, stage III (moderate)     baselin Cr 1.5-1.8 as of 06/2012   Past surgical, social, and family history reviewed and no changes noted since last office visit.  MEDS:  Outpatient Prescriptions Prior to Visit  Medication Sig Dispense Refill  . amLODipine (NORVASC) 10 MG tablet Take 1 tablet (10 mg total) by mouth daily.  30 tablet  6  . aspirin EC 81 MG tablet Take 81 mg by mouth daily.      . carvedilol (COREG) 6.25 MG tablet Take 6.25 mg by mouth 2 (two) times daily with a meal.      . furosemide (LASIX) 40 MG tablet Take 1 tablet (40 mg total) by mouth daily.  30 tablet  6  . glipiZIDE (GLUCOTROL XL) 5 MG 24 hr tablet Take 1 tablet (5 mg total) by mouth daily.  90 tablet  1  . losartan (COZAAR) 100 MG tablet Take 1 tablet (100 mg total) by mouth daily.  30 tablet  5  . traMADol (ULTRAM) 50 MG tablet TAKE ONE TO TWO TABLETS BY MOUTH EVERY 6  HOURS AS NEEDED FOR PAIN  30 tablet  0   No facility-administered medications prior to visit.    PE: Blood pressure 143/77, pulse 94, temperature 98 F (36.7 C), temperature source Temporal, resp. rate 18, height 5' 5.5" (1.664 m), weight 182 lb (82.555 kg), SpO2 94.00%. Gen: Alert, well appearing.  Patient is oriented to person, place, time, and situation. CV: RRR, no m/r/g ABD: soft, NT/ND, BS normal. Back: no CVA tenderness. No side or flank tenderness. DRE: anal sphincter tone normal, no rectal mass, prostate is moderately large but without tenderness, bogginess, nodule, or asymmetry.  IMPRESSION AND PLAN:  Acute cystitis Sent urine for c/s.  Started bactrim DS 1 bid x 5d. Discussed the fact that this is an unusual dx in a male, esp since he doesn't seem to have any obstructive-type urinary complaints.   He says he has had one other similar UTI in the past a few years ago, dx'd by his nephrologist, Dr. Briant Cedar.   FOLLOW UP: he has routine chronic illness f/u scheduled for next month.

## 2013-04-11 LAB — URINE CULTURE

## 2013-04-13 MED ORDER — CIPROFLOXACIN HCL 250 MG PO TABS: 250.0000 mg | ORAL_TABLET | Freq: Two times a day (BID) | ORAL | Status: DC

## 2013-04-13 NOTE — Addendum Note (Signed)
Addended by: Eulah Pont on: 04/13/2013 08:46 AM   Modules accepted: Orders

## 2013-05-05 ENCOUNTER — Ambulatory Visit: Payer: PRIVATE HEALTH INSURANCE | Admitting: Family Medicine

## 2013-05-18 ENCOUNTER — Ambulatory Visit: Payer: PRIVATE HEALTH INSURANCE | Admitting: Family Medicine

## 2013-05-22 ENCOUNTER — Encounter: Payer: Self-pay | Admitting: *Deleted

## 2013-05-25 ENCOUNTER — Encounter: Payer: Self-pay | Admitting: Family Medicine

## 2013-05-25 ENCOUNTER — Ambulatory Visit (INDEPENDENT_AMBULATORY_CARE_PROVIDER_SITE_OTHER): Payer: PRIVATE HEALTH INSURANCE | Admitting: Family Medicine

## 2013-05-25 VITALS — BP 124/70 | HR 53 | Temp 98.0°F | Resp 16 | Ht 65.5 in | Wt 169.0 lb

## 2013-05-25 DIAGNOSIS — E119 Type 2 diabetes mellitus without complications: Secondary | ICD-10-CM

## 2013-05-25 DIAGNOSIS — I10 Essential (primary) hypertension: Secondary | ICD-10-CM

## 2013-05-25 DIAGNOSIS — N183 Chronic kidney disease, stage 3 unspecified: Secondary | ICD-10-CM

## 2013-05-25 LAB — LIPID PANEL
Cholesterol: 149 mg/dL (ref 0–200)
LDL Cholesterol: 97 mg/dL (ref 0–99)
Total CHOL/HDL Ratio: 4
VLDL: 16.4 mg/dL (ref 0.0–40.0)

## 2013-05-25 NOTE — Patient Instructions (Signed)
Cut your Coreg (carvedilol) 6.25mg  tab in 1/2 and take this twice per day to see if your heart rate and lightheaded spells get better.

## 2013-05-25 NOTE — Progress Notes (Signed)
OFFICE NOTE  05/25/2013  CC:  Chief Complaint  Patient presents with  . Diabetes    follow up     HPI: Patient is a 67 y.o. Caucasian male who is here for 4 mo f/u DM 2, HTN, CRI.   All UTI sx's last visit have resolved.  He had trouble with eating for a while and lost some wt but appetite back to normal. GLucoses normal per pt. BP normal but he is noting a few occasions where he feels lightheaded and this lasts 30 sec.  He has noted his HR in the 60s mostly but occ in 50s lately.     Pertinent PMH:  Past Medical History  Diagnosis Date  . HTN (hypertension)     Renal/aortic doppler u/s normal 06/2010  . Diabetes mellitus Dx'd fall 2011    type 2  . Tobacco dependence   . Nonspecific abnormal electrocardiogram (ECG) (EKG) Fall 2011    TWI in inferior leads: myocardial perfusion scan neg and echo normal 07/2010  . Chronic renal insufficiency, stage III (moderate)     baseline Cr 1.5-1.8 as of 06/2012  . Hypercalcemia    Past Surgical History  Procedure Laterality Date  . Colonoscopy  12/20/04    Normal.  (due for f/u 2014)  . Cataract extraction  2007, 2008    Bilateral ([facility] on [address].  . Esophagogastroduodenoscopy  11/2004    esoph stricture/dilation  . Cardiovascular stress test  07/2010    Myocardial perfusion scan neg/low risk.  . Transthoracic echocardiogram  07/2010    EF =>55%; LA mildly dilated; trace MR/TR;    Past family and social history reviewed and there are no changes since the patient's last office visit with me.  MEDS:  Outpatient Prescriptions Prior to Visit  Medication Sig Dispense Refill  . amLODipine (NORVASC) 10 MG tablet Take 1 tablet (10 mg total) by mouth daily.  30 tablet  6  . aspirin EC 81 MG tablet Take 81 mg by mouth daily.      . carvedilol (COREG) 6.25 MG tablet Take 6.25 mg by mouth 2 (two) times daily with a meal.      . furosemide (LASIX) 40 MG tablet Take 1 tablet (40 mg total) by mouth daily.  30 tablet   6  . glipiZIDE (GLUCOTROL XL) 5 MG 24 hr tablet Take 1 tablet (5 mg total) by mouth daily.  90 tablet  1  . losartan (COZAAR) 100 MG tablet Take 1 tablet (100 mg total) by mouth daily.  30 tablet  5  . ciprofloxacin (CIPRO) 250 MG tablet Take 1 tablet (250 mg total) by mouth 2 (two) times daily.  10 tablet  0  . sulfamethoxazole-trimethoprim (BACTRIM DS) 800-160 MG per tablet Take 1 tablet by mouth 2 (two) times daily.  10 tablet  0  . traMADol (ULTRAM) 50 MG tablet TAKE ONE TO TWO TABLETS BY MOUTH EVERY 6 HOURS AS NEEDED FOR PAIN  30 tablet  0   No facility-administered medications prior to visit.    PE: Blood pressure 124/70, pulse 53, temperature 98 F (36.7 C), temperature source Temporal, resp. rate 16, height 5' 5.5" (1.664 m), weight 169 lb (76.658 kg), SpO2 98.00%. Gen: Alert, well appearing.  Patient is oriented to person, place, time, and situation. CV: regular, brady to 55, w/out m/r/g EXT: trace bilat LE edema  IMPRESSION AND PLAN:  1) DM 2, stable.  Screen for hyperlipidemia today. 2) HTN, stable but HR a bit  low---UNCLEAR whether it is connected to his brief lightheadedness in the last week. 3) CRI: Dr. Briant Cedar following.  He also follows pt's A1c's so I won't repeat this today (last in 12/2012 was <7%). 4) UTI recently.  Resolved.  BPH likely contributing to his risk for this.  Will do screening PSA at this time. 5) Tob dep: he is cutting back (1/2 pack in the last 2 wks per his report) .  Encouraged complete cessation.  FOLLOW UP: 6 mo

## 2013-05-26 ENCOUNTER — Other Ambulatory Visit: Payer: Self-pay | Admitting: Family Medicine

## 2013-05-26 DIAGNOSIS — R972 Elevated prostate specific antigen [PSA]: Secondary | ICD-10-CM

## 2013-05-27 ENCOUNTER — Ambulatory Visit: Payer: PRIVATE HEALTH INSURANCE | Admitting: Cardiovascular Disease

## 2013-05-28 ENCOUNTER — Ambulatory Visit (INDEPENDENT_AMBULATORY_CARE_PROVIDER_SITE_OTHER): Payer: PRIVATE HEALTH INSURANCE | Admitting: Cardiovascular Disease

## 2013-05-28 ENCOUNTER — Encounter: Payer: Self-pay | Admitting: Cardiovascular Disease

## 2013-05-28 VITALS — BP 122/66 | HR 65 | Resp 16 | Ht 66.5 in | Wt 170.4 lb

## 2013-05-28 DIAGNOSIS — F17200 Nicotine dependence, unspecified, uncomplicated: Secondary | ICD-10-CM

## 2013-05-28 DIAGNOSIS — N529 Male erectile dysfunction, unspecified: Secondary | ICD-10-CM

## 2013-05-28 DIAGNOSIS — N183 Chronic kidney disease, stage 3 unspecified: Secondary | ICD-10-CM

## 2013-05-28 DIAGNOSIS — I10 Essential (primary) hypertension: Secondary | ICD-10-CM

## 2013-05-28 DIAGNOSIS — E119 Type 2 diabetes mellitus without complications: Secondary | ICD-10-CM

## 2013-05-28 MED ORDER — TADALAFIL 20 MG PO TABS: 10.0000 mg | ORAL_TABLET | Freq: Every day | ORAL | Status: DC | PRN

## 2013-05-28 MED ORDER — CARVEDILOL 6.25 MG PO TABS: 6.2500 mg | ORAL_TABLET | Freq: Two times a day (BID) | ORAL | Status: DC

## 2013-05-28 NOTE — Patient Instructions (Addendum)
Decrease Carvedilol to 1/2 tablet in the AM and 1 tablet in the PM.  Prescription for Cialis 20mg  sent to your pharmacy.  Take 1/2 tablet as needed.  Your physician recommends that you schedule a follow-up appointment in: One year

## 2013-05-31 ENCOUNTER — Encounter: Payer: Self-pay | Admitting: Cardiovascular Disease

## 2013-05-31 DIAGNOSIS — N529 Male erectile dysfunction, unspecified: Secondary | ICD-10-CM | POA: Insufficient documentation

## 2013-05-31 NOTE — Assessment & Plan Note (Addendum)
The importance of complete smoking cessation was discussed in detail. He tells me that he only an occasional cigarette when he drinks a beer. A pack of cigarettes lasts him almost a week. I advised him that he would not small amount of cigarette smoking can be harmful to his health.

## 2013-05-31 NOTE — Assessment & Plan Note (Signed)
He has had good results with Cialis in the past. I have reminded him about the risks of combining erectile dysfunction medications with any form of nitrate.

## 2013-05-31 NOTE — Assessment & Plan Note (Signed)
Good glycemic control. The presence of low HDL cholesterol levels is in keeping with insulin resistance. He is mildly overweight. He has made some progress in weight loss with about 8 pounds less since last year. He probably needs to lose another 10 pounds or so.

## 2013-05-31 NOTE — Assessment & Plan Note (Addendum)
Excellent control. He takes 4 different agents for hypertension, this may have something to do with his erectile dysfunction. I don't really see away around taking a beta blocker. We'll decrease the daytime dose of carvedilol to half of the 6.25 mg tablet and continue taking the full dose in the evening.

## 2013-05-31 NOTE — Progress Notes (Signed)
Patient ID: Antonio Serrano, male   DOB: December 18, 1945, 67 y.o.   MRN: [id number]    Reason for office visit  hypertension, hyperlipidemia, diabetes mellitus  Stenosis and was initially referred to cardiology 2011 for an abnormal electrocardiogram. He has numerous coronary risk factors including ongoing smoking. A nuclear stress test performed at that time was normal; he has preserved left ventricular systolic function; by echocardiography the only meaningful structural abnormality was mild left atrial dilatation (end systolic diameter 42 mm).  Does not have any cardiovascular complaints other than erectile dysfunction. He is excited about the fact that he will soon retire and plans to travel a lot. He came here today specifically to assess for medications for erectile dysfunction.    Allergies  Allergen Reactions  . Bactrim [Sulfamethoxazole W-Trimethoprim] Nausea Only    Current Outpatient Prescriptions  Medication Sig Dispense Refill  . amLODipine (NORVASC) 10 MG tablet Take 1 tablet (10 mg total) by mouth daily.  30 tablet  6  . aspirin EC 81 MG tablet Take 81 mg by mouth daily.      . carvedilol (COREG) 6.25 MG tablet Take 1 tablet (6.25 mg total) by mouth 2 (two) times daily with a meal. Take 1/2 tablet in the AM and 1 tablet in the PM  45 tablet  11  . furosemide (LASIX) 40 MG tablet Take 1 tablet (40 mg total) by mouth daily.  30 tablet  6  . glipiZIDE (GLUCOTROL XL) 5 MG 24 hr tablet Take 1 tablet (5 mg total) by mouth daily.  90 tablet  1  . losartan (COZAAR) 100 MG tablet Take 1 tablet (100 mg total) by mouth daily.  30 tablet  5  . traMADol (ULTRAM) 50 MG tablet TAKE ONE TO TWO TABLETS BY MOUTH EVERY 6 HOURS AS NEEDED FOR PAIN  30 tablet  0  . tadalafil (CIALIS) 20 MG tablet Take 0.5 tablets (10 mg total) by mouth daily as needed for erectile dysfunction.  10 tablet  3   No current facility-administered medications for this visit.    Past Medical History  Diagnosis Date  . HTN  (hypertension)     Renal/aortic doppler u/s normal 06/2010  . Diabetes mellitus Dx'd fall 2011    type 2  . Tobacco dependence   . Nonspecific abnormal electrocardiogram (ECG) (EKG) Fall 2011    TWI in inferior leads: myocardial perfusion scan neg and echo normal 07/2010  . Chronic renal insufficiency, stage III (moderate)     baseline Cr 1.5-1.8 as of 06/2012  . Hypercalcemia   . UTI (lower urinary tract infection)     Feb 2012 (klebsiella--dx at [facility]); 03/2013 e coli.     Past Surgical History  Procedure Laterality Date  . Colonoscopy  12/20/04    Normal.  (due for f/u 2016)  . Cataract extraction  2007, 2008    Bilateral ([facility] on [address].  . Esophagogastroduodenoscopy  11/2004    esoph stricture/dilation  . Cardiovascular stress test  07/2010    Myocardial perfusion scan neg/low risk.  . Transthoracic echocardiogram  07/2010    EF =>55%; LA mildly dilated; trace MR/TR;     Family History  Problem Relation Age of Onset  . Dementia Mother   . Pneumonia Father     died in 97's of pneumonia, was otherwise healthy  . Cancer Sister     colon cancer.  Died age 80  . Diabetes Paternal Aunt     History   Social History  .  Marital Status: Married    Spouse Name: N/A    Number of Children: N/A  . Years of Education: N/A   Occupational History  . Not on file.   Social History Main Topics  . Smoking status: Current Every Day Smoker -- 0.20 packs/day for 40 years    Types: Cigarettes  . Smokeless tobacco: Never Used  . Alcohol Use: Yes  . Drug Use: No  . Sexual Activity: Not on file   Other Topics Concern  . Not on file   Social History Narrative   Married, 4 grown children, 6 grandchildren.   Works in Production designer, theatre/television/film at [employer] in [place], [place].  Worked in [employer] all his life.   Lives in [place].   25 pack-yr tobacco hx--current as of 01/28/12.   Drinks about a six pack per week.  Enjoys golf--six handicap at one point.  No  formal exercise.   Walks a lot for his job.          Review of systems: The patient specifically denies any chest pain at rest or with exertion, dyspnea at rest or with exertion, orthopnea, paroxysmal nocturnal dyspnea, syncope, palpitations, focal neurological deficits, intermittent claudication, lower extremity edema, unexplained weight gain, cough, hemoptysis or wheezing.  The patient also denies abdominal pain, nausea, vomiting, dysphagia, diarrhea, constipation, polyuria, polydipsia, dysuria, hematuria, frequency, urgency, abnormal bleeding or bruising, fever, chills, unexpected weight changes, mood swings, change in skin or hair texture, change in voice quality, auditory or visual problems, allergic reactions or rashes, new musculoskeletal complaints other than usual "aches and pains".   PHYSICAL EXAM BP 122/66  Pulse 65  Resp 16  Ht 5' 6.5" (1.689 m)  Wt 170 lb 6.4 oz (77.293 kg)  BMI 27.09 kg/m2  General: Alert, oriented x3, no distress Head: no evidence of trauma, PERRL, EOMI, no exophtalmos or lid lag, no myxedema, no xanthelasma; normal ears, nose and oropharynx Neck: normal jugular venous pulsations and no hepatojugular reflux; brisk carotid pulses without delay and no carotid bruits Chest: clear to auscultation, no signs of consolidation by percussion or palpation, normal fremitus, symmetrical and full respiratory excursions Cardiovascular: normal position and quality of the apical impulse, regular rhythm, normal first and second heart sounds, no murmurs, rubs or gallops Abdomen: no tenderness or distention, no masses by palpation, no abnormal pulsatility or arterial bruits, normal bowel sounds, no hepatosplenomegaly Extremities: no clubbing, cyanosis or edema; 2+ radial, ulnar and brachial pulses bilaterally; 2+ right femoral, posterior tibial and dorsalis pedis pulses; 2+ left femoral, posterior tibial and dorsalis pedis pulses; no subclavian or femoral bruits Neurological:  grossly nonfocal   EKG: Sinus rhythm with first degree AV block otherwise normal  Lipid Panel     Component Value Date/Time   CHOL 149 05/25/2013 0843   TRIG 82.0 05/25/2013 0843   HDL 35.60* 05/25/2013 0843   CHOLHDL 4 05/25/2013 0843   VLDL 16.4 05/25/2013 0843   LDLCALC 97 05/25/2013 0843    BMET    Component Value Date/Time   NA 135 05/29/2012 1610   K 4.0 05/29/2012 1610   CL 99 05/29/2012 1610   CO2 24 05/29/2012 1610   GLUCOSE 105* 05/29/2012 1610   BUN 18 05/29/2012 1610   CREATININE 1.87* 05/29/2012 1610   CALCIUM 10.1 05/29/2012 1610     ASSESSMENT AND PLAN Type II or unspecified type diabetes mellitus without mention of complication, not stated as uncontrolled Good glycemic control. The presence of low HDL cholesterol levels is in keeping with  insulin resistance. He is mildly overweight. He has made some progress in weight loss with about 8 pounds less since last year. He probably needs to lose another 10 pounds or so.  Erectile dysfunction He has had good results with Cialis in the past. I have reminded him about the risks of combining erectile dysfunction medications with any form of nitrate.  Chronic renal insufficiency, stage III (moderate) Last year creatinine was 1.5, corresponding to a creatinine clearance of just under 50 mL per minute  HTN (hypertension), benign Excellent control. He takes 4 different agents for hypertension, this may have something to do with his erectile dysfunction. I don't really see away around taking a beta blocker. We'll decrease the daytime dose of carvedilol to half of the 6.25 mg tablet and continue taking the full dose in the evening.  Tobacco dependence The importance of complete smoking cessation was discussed in detail. He tells me that he only an occasional cigarette when he drinks a beer. A pack of cigarettes lasts him almost a week. I advised him that he would not small amount of cigarette smoking can be harmful to his  health.   Orders Placed This Encounter  Procedures  . EKG 12-Lead   Meds ordered this encounter  Medications  . carvedilol (COREG) 6.25 MG tablet    Sig: Take 1 tablet (6.25 mg total) by mouth 2 (two) times daily with a meal. Take 1/2 tablet in the AM and 1 tablet in the PM    Dispense:  45 tablet    Refill:  11  . tadalafil (CIALIS) 20 MG tablet    Sig: Take 0.5 tablets (10 mg total) by mouth daily as needed for erectile dysfunction.    Dispense:  10 tablet    Refill:  3    Daelin Haste  Thurmon Fair, MD, [facility] [phone] office [phone] pager

## 2013-05-31 NOTE — Assessment & Plan Note (Signed)
Last year creatinine was 1.5, corresponding to a creatinine clearance of just under 50 mL per minute

## 2013-06-05 ENCOUNTER — Encounter: Payer: Self-pay | Admitting: Family Medicine

## 2013-06-05 ENCOUNTER — Ambulatory Visit (INDEPENDENT_AMBULATORY_CARE_PROVIDER_SITE_OTHER): Payer: PRIVATE HEALTH INSURANCE | Admitting: Family Medicine

## 2013-06-05 ENCOUNTER — Ambulatory Visit (HOSPITAL_BASED_OUTPATIENT_CLINIC_OR_DEPARTMENT_OTHER)
Admission: RE | Admit: 2013-06-05 | Discharge: 2013-06-05 | Disposition: A | Discharge status: Home / Self Care | Payer: PRIVATE HEALTH INSURANCE | Source: Ambulatory Visit | Attending: Family Medicine | Admitting: Family Medicine

## 2013-06-05 ENCOUNTER — Other Ambulatory Visit: Payer: Self-pay | Admitting: Family Medicine

## 2013-06-05 VITALS — BP 115/73 | HR 65 | Temp 98.4°F | Resp 16 | Ht 65.5 in | Wt 167.0 lb

## 2013-06-05 DIAGNOSIS — N50811 Right testicular pain: Secondary | ICD-10-CM

## 2013-06-05 DIAGNOSIS — N509 Disorder of male genital organs, unspecified: Secondary | ICD-10-CM | POA: Insufficient documentation

## 2013-06-05 DIAGNOSIS — N5089 Other specified disorders of the male genital organs: Secondary | ICD-10-CM

## 2013-06-05 DIAGNOSIS — N5082 Scrotal pain: Secondary | ICD-10-CM

## 2013-06-05 DIAGNOSIS — N433 Hydrocele, unspecified: Secondary | ICD-10-CM | POA: Insufficient documentation

## 2013-06-05 DIAGNOSIS — N508 Other specified disorders of male genital organs: Secondary | ICD-10-CM | POA: Insufficient documentation

## 2013-06-05 IMAGING — US US SCROTUM
1 series · 13 of 22 positions shown · non-contrast
Comparison: None

CLINICAL DATA: Right scrotal swelling and pain

SCROTAL ULTRASOUND
DOPPLER ULTRASOUND OF THE TESTICLES
TECHNIQUE: Complete ultrasound examination of the testicles,
epididymis, and other scrotal structures was performed.  Color and
spectral Doppler ultrasound were also utilized to evaluate blood
flow to the testicles.

[Series 1: us scrotum · 0.09mm/px · 13 of 22 slices shown]
[im 1/22]
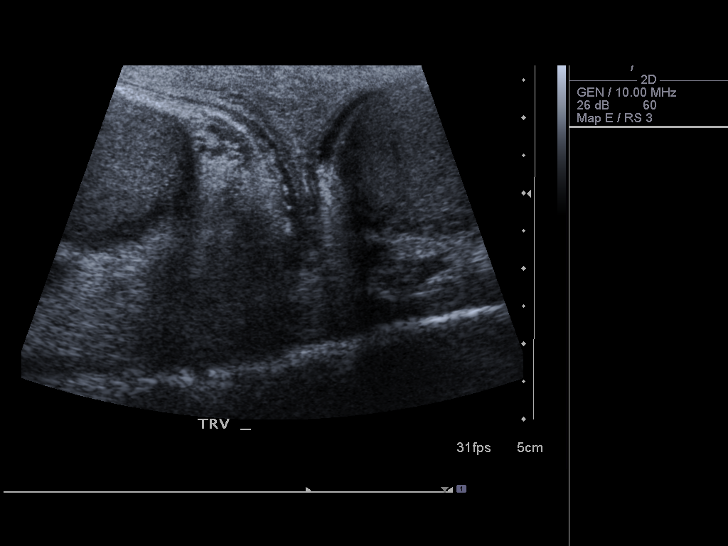
[im 3/22]
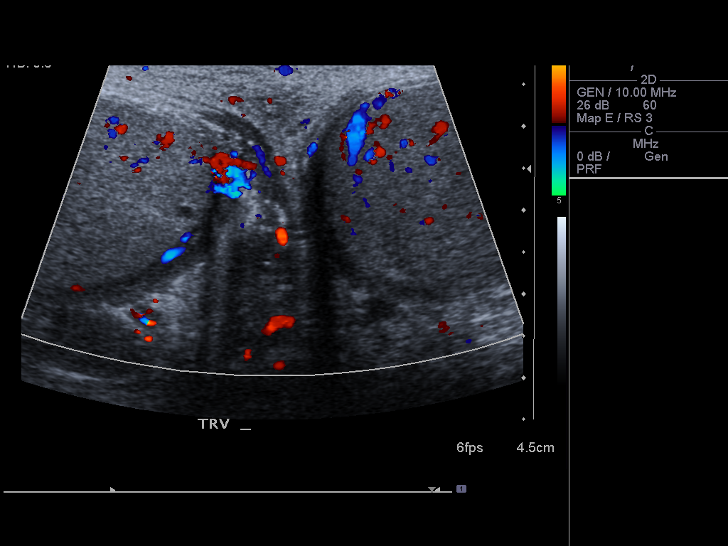
[im 5/22]
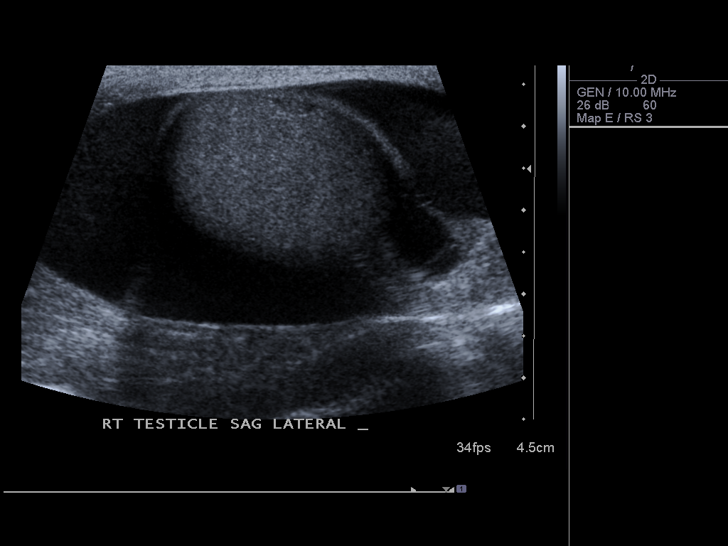
[im 6/22]
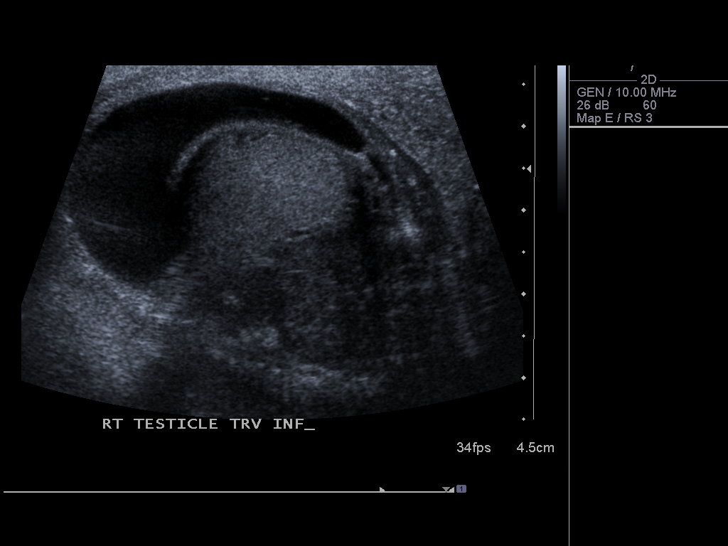
[im 8/22]
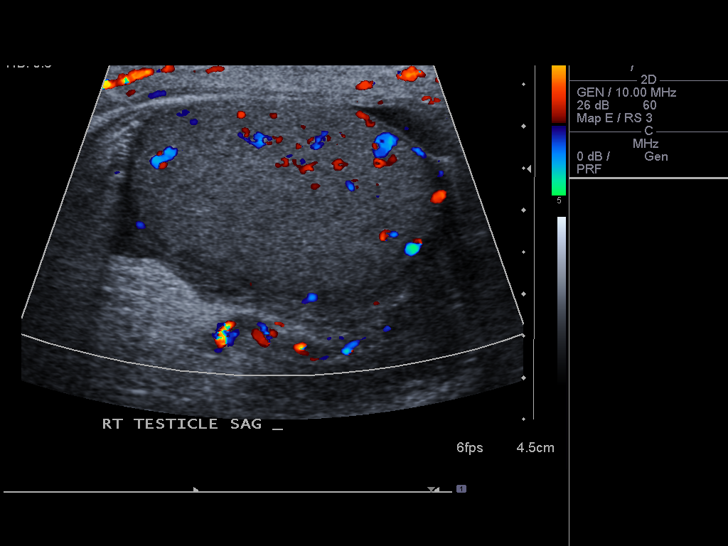
[im 10/22]
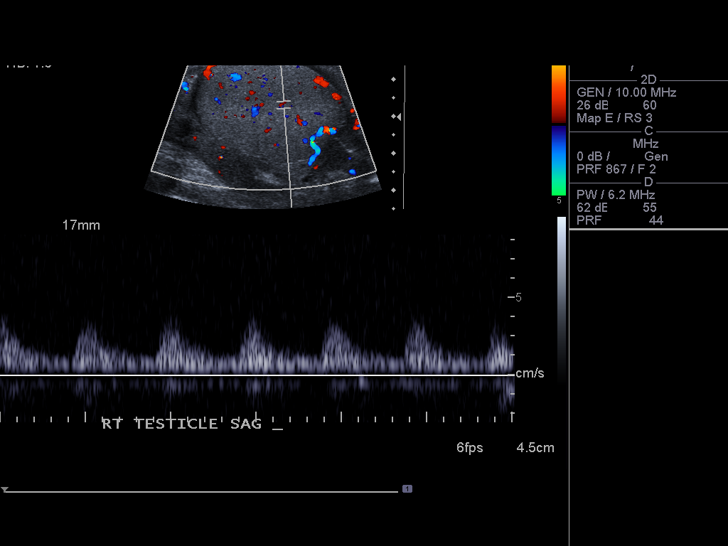
[im 12/22]
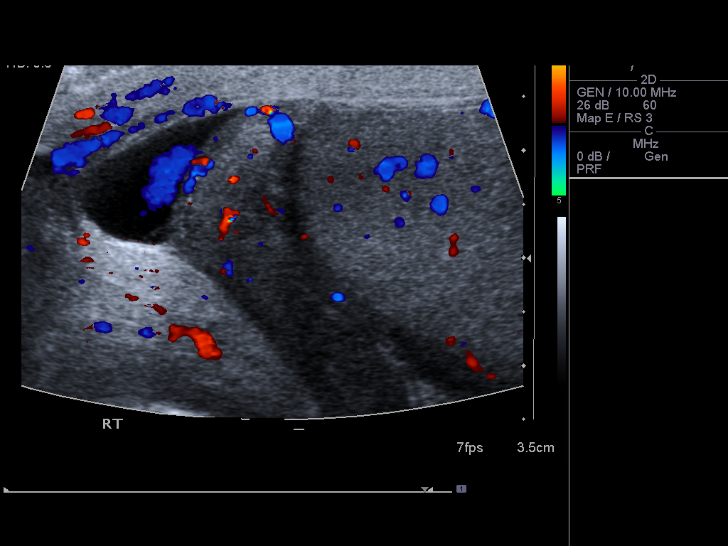
[im 13/22]
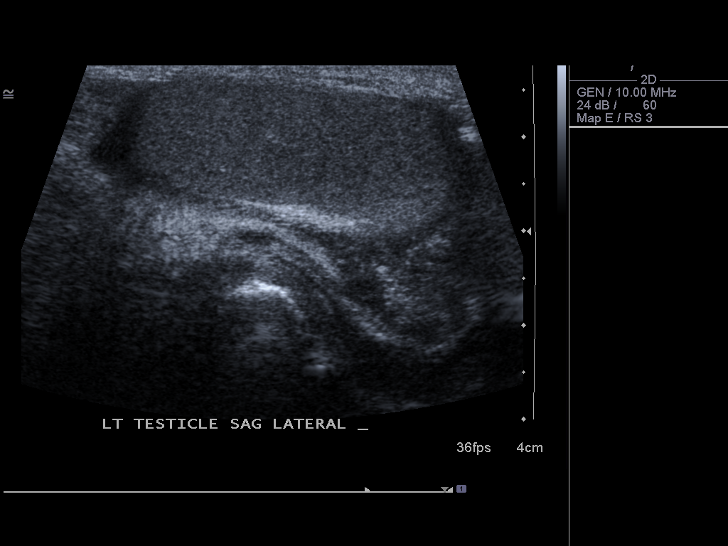
[im 15/22]
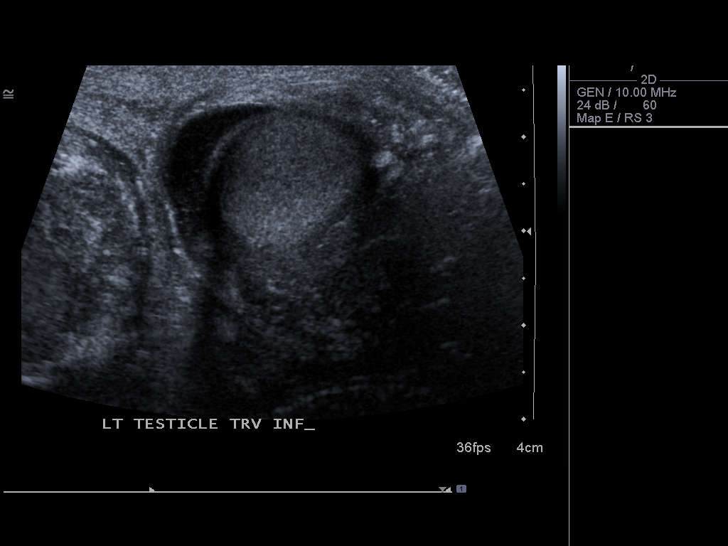
[im 17/22]
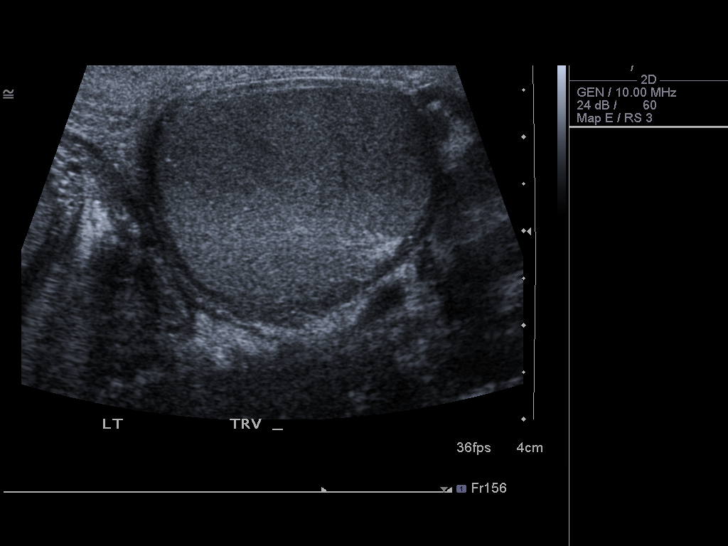
[im 18/22]
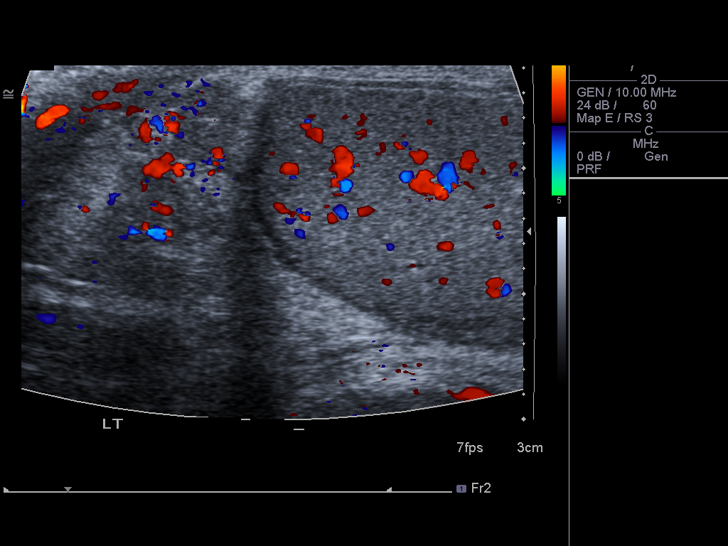
[im 20/22]
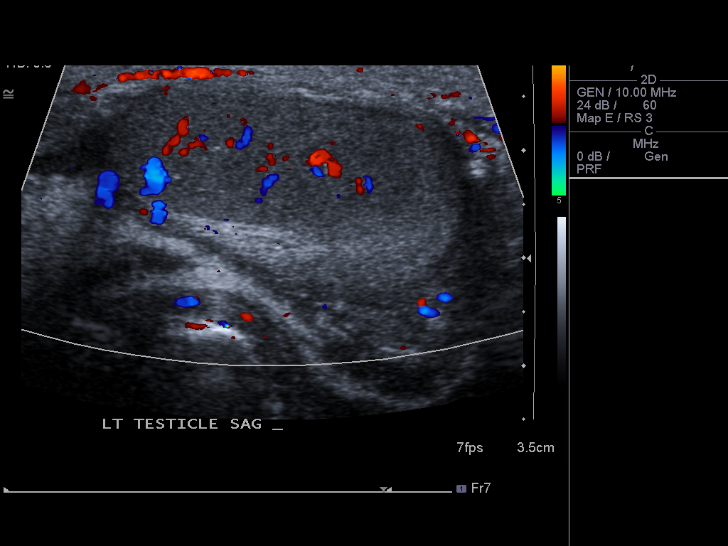
[im 22/22]
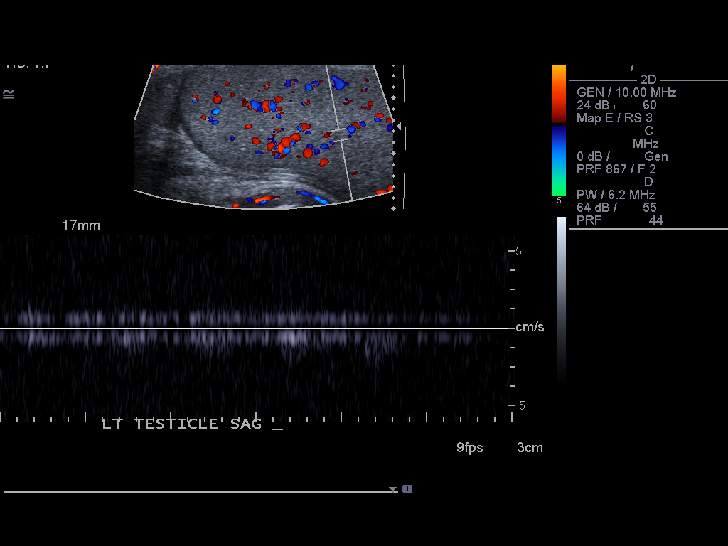

[13 of 22 positions shown; findings below may reference images not displayed]

FINDINGS: Right testis:  The right testicle measures 4 x 2.4 x 3.2 cm and is
sonographically unremarkable.  Both arterial and venous flow is
demonstrated on color Doppler and pulsed duplex analysis.  Normal
low resistance wave forms are present.

Left testis:  The left testicle measures 4.5 x 2.1 x 2.9 cm and is
sonographically unremarkable.  Both arterial and venous flow is
demonstrated on color Doppler and pulse duplex analysis.  Normal
low resistance wave forms are present.

Right epididymis:  Enlarged and heterogeneous right epididymis with
increased vascularity on color Doppler analysis.  Findings are most
consistent with epididymitis.  Incidental note is made of a 4 x 4
mm simple epididymal head cyst versus spermatocele.

Left epididymis:  Normal in size and appearance.

Hydrocele:  Sonographically simple right hydrocele.

Varicocele:  Absent

Pulsed Doppler interrogation of both testes demonstrates low
resistance flow bilaterally.
IMPRESSION: 1.  Heterogeneous, mildly enlarged and hypervascular right
epididymis consistent with epididymitis.
2.  There is an associated sonographically simple right hydrocele.
3.  Incidental note is made of a small right epididymal head cyst
versus spermatocele.
4.  No sonographic evidence of testicular torsion.

## 2013-06-05 MED ORDER — HYDROCODONE-ACETAMINOPHEN 5-325 MG PO TABS: ORAL_TABLET | ORAL | Status: DC

## 2013-06-05 MED ORDER — LEVOFLOXACIN 750 MG PO TABS: ORAL_TABLET | ORAL | Status: DC

## 2013-06-05 MED ORDER — GLIPIZIDE ER 5 MG PO TB24: 5.0000 mg | ORAL_TABLET | Freq: Every day | ORAL | Status: DC

## 2013-06-05 NOTE — Progress Notes (Signed)
OFFICE NOTE  06/05/2013  CC:  Chief Complaint  Patient presents with  . Chills     HPI: Patient is a 66 y.o. Caucasian male who is here for testicle swelling. Onset of throbbing pain and enlargement of right testicle about 6 days ago.  The pain now has resolved but still with enlargement of testicle.  No dysuria, no difficulty with urinating.  No penile d/c. Never had this before.  Describes subjective f/c at night.  He is married and monogamous.  No n/v or abd distention or abd pain.  Pertinent PMH:  Past Medical History  Diagnosis Date  . HTN (hypertension)     Renal/aortic doppler u/s normal 06/2010  . Diabetes mellitus Dx'd fall 2011    type 2  . Tobacco dependence   . Nonspecific abnormal electrocardiogram (ECG) (EKG) Fall 2011    TWI in inferior leads: myocardial perfusion scan neg and echo normal 07/2010  . Chronic renal insufficiency, stage III (moderate)     baseline Cr 1.5-1.8 as of 06/2012  . Hypercalcemia   . UTI (lower urinary tract infection)     Feb 2012 (klebsiella--dx at [facility]); 03/2013 e coli.    Past surgical, social, and family history reviewed and no changes noted since last office visit.  MEDS:  Outpatient Prescriptions Prior to Visit  Medication Sig Dispense Refill  . amLODipine (NORVASC) 10 MG tablet Take 1 tablet (10 mg total) by mouth daily.  30 tablet  6  . aspirin EC 81 MG tablet Take 81 mg by mouth daily.      . carvedilol (COREG) 6.25 MG tablet Take 1 tablet (6.25 mg total) by mouth 2 (two) times daily with a meal. Take 1/2 tablet in the AM and 1 tablet in the PM  45 tablet  11  . furosemide (LASIX) 40 MG tablet Take 1 tablet (40 mg total) by mouth daily.  30 tablet  6  . losartan (COZAAR) 100 MG tablet Take 1 tablet (100 mg total) by mouth daily.  30 tablet  5  . traMADol (ULTRAM) 50 MG tablet TAKE ONE TO TWO TABLETS BY MOUTH EVERY 6 HOURS AS NEEDED FOR PAIN  30 tablet  0  . glipiZIDE (GLUCOTROL XL) 5 MG 24 hr tablet Take 1 tablet (5 mg total) by  mouth daily.  90 tablet  1  . tadalafil (CIALIS) 20 MG tablet Take 0.5 tablets (10 mg total) by mouth daily as needed for erectile dysfunction.  10 tablet  3   No facility-administered medications prior to visit.    PE: Blood pressure 115/73, pulse 65, temperature 98.4 F (36.9 C), temperature source Temporal, resp. rate 16, height 5' 5.5" (1.664 m), weight 167 lb (75.751 kg), SpO2 97.00%. Gen: Alert, well appearing.  Patient is oriented to person, place, time, and situation. CV: RRR, no m/r/g.   LUNGS: CTA bilat, nonlabored resps, good aeration in all lung fields. ABD: soft, NT, ND, BS normal.  No hepatospenomegaly or mass.  No bruits. GU: right testicular enlargement with moderate right testicle tenderness to palpation.  Right hemiscrotum diffusely enlarged with edematous normal scrotal contents vs herniated tissue.  IMPRESSION AND PLAN:  Testicular pain, right Suspect acute epididymo-orchitis but will try to further clarify with scrotal u/s today. Start levaquin 500mg  every other day for 14 doses (renal dosing). Vicodin 5/325, 1-2 tabs po q6h prn, #30, no RF.   An After Visit Summary was printed and given to the patient.  FOLLOW UP: prn

## 2013-06-17 ENCOUNTER — Encounter: Payer: Self-pay | Admitting: Family Medicine

## 2013-06-25 DIAGNOSIS — N50811 Right testicular pain: Secondary | ICD-10-CM | POA: Insufficient documentation

## 2013-06-25 NOTE — Assessment & Plan Note (Addendum)
Suspect acute epididymo-orchitis but will try to further clarify with scrotal u/s today. Start levaquin 750mg  every other day for 14 doses (renal dosing). Vicodin 5/325, 1-2 tabs po q6h prn, #30, no RF.

## 2013-07-28 ENCOUNTER — Other Ambulatory Visit: Payer: Self-pay | Admitting: Family Medicine

## 2013-07-28 MED ORDER — FUROSEMIDE 40 MG PO TABS: 40.0000 mg | ORAL_TABLET | Freq: Every day | ORAL | Status: DC

## 2013-07-28 MED ORDER — AMLODIPINE BESYLATE 10 MG PO TABS: 10.0000 mg | ORAL_TABLET | Freq: Every day | ORAL | Status: DC

## 2013-09-04 ENCOUNTER — Telehealth: Payer: Self-pay | Admitting: Family Medicine

## 2013-09-04 NOTE — Telephone Encounter (Signed)
Patient will run out of his medication Mon 09/07/13.

## 2013-09-07 ENCOUNTER — Telehealth: Payer: Self-pay | Admitting: Family Medicine

## 2013-09-07 MED ORDER — CARVEDILOL 6.25 MG PO TABS: 6.2500 mg | ORAL_TABLET | Freq: Two times a day (BID) | ORAL | Status: DC

## 2013-09-07 NOTE — Telephone Encounter (Signed)
Medication sent to pharmacy  

## 2013-09-07 NOTE — Telephone Encounter (Signed)
Patient called stating that [facility] did not get e-rx for his coreg.  I called pharmacy and left message on machine for pharmacist.

## 2013-09-30 NOTE — Telephone Encounter (Signed)
Close Encounter 

## 2013-10-05 ENCOUNTER — Telehealth: Payer: Self-pay | Admitting: Family Medicine

## 2013-10-06 ENCOUNTER — Other Ambulatory Visit: Payer: Self-pay | Admitting: Family Medicine

## 2013-10-06 MED ORDER — LOSARTAN POTASSIUM 100 MG PO TABS: 100.0000 mg | ORAL_TABLET | Freq: Every day | ORAL | Status: DC

## 2013-10-06 NOTE — Telephone Encounter (Signed)
Losartan sent to pharmacy for 30 day supply x 3 refills per [facility] protocol.

## 2013-11-09 ENCOUNTER — Encounter: Payer: Self-pay | Admitting: Family Medicine

## 2013-11-25 ENCOUNTER — Ambulatory Visit (INDEPENDENT_AMBULATORY_CARE_PROVIDER_SITE_OTHER): Payer: BC Managed Care – HMO | Admitting: Family Medicine

## 2013-11-25 ENCOUNTER — Encounter: Payer: Self-pay | Admitting: Family Medicine

## 2013-11-25 VITALS — BP 129/68 | HR 59 | Temp 98.8°F | Resp 18 | Ht 65.5 in | Wt 179.0 lb

## 2013-11-25 DIAGNOSIS — E1165 Type 2 diabetes mellitus with hyperglycemia: Principal | ICD-10-CM

## 2013-11-25 DIAGNOSIS — E119 Type 2 diabetes mellitus without complications: Secondary | ICD-10-CM

## 2013-11-25 DIAGNOSIS — I10 Essential (primary) hypertension: Secondary | ICD-10-CM

## 2013-11-25 DIAGNOSIS — N183 Chronic kidney disease, stage 3 unspecified: Secondary | ICD-10-CM

## 2013-11-25 DIAGNOSIS — IMO0001 Reserved for inherently not codable concepts without codable children: Secondary | ICD-10-CM | POA: Insufficient documentation

## 2013-11-25 LAB — MICROALBUMIN / CREATININE URINE RATIO
Creatinine,U: 128.8 mg/dL
MICROALB/CREAT RATIO: 1.2 mg/g (ref 0.0–30.0)
Microalb, Ur: 1.5 mg/dL (ref 0.0–1.9)

## 2013-11-25 MED ORDER — PIOGLITAZONE HCL 15 MG PO TABS: 15.0000 mg | ORAL_TABLET | Freq: Every day | ORAL | Status: DC

## 2013-11-25 MED ORDER — TADALAFIL 20 MG PO TABS: 10.0000 mg | ORAL_TABLET | Freq: Every day | ORAL | Status: DC | PRN

## 2013-11-25 MED ORDER — GLIPIZIDE ER 5 MG PO TB24: 5.0000 mg | ORAL_TABLET | Freq: Every day | ORAL | Status: DC

## 2013-11-25 NOTE — Progress Notes (Signed)
Pre visit review using our clinic review tool, if applicable. No additional management support is needed unless otherwise documented below in the visit note. 

## 2013-11-25 NOTE — Progress Notes (Signed)
OFFICE NOTE  11/25/2013  CC:  Chief Complaint  Patient presents with  . Follow-up    6 month     HPI: Patient is a 68 y.o. Caucasian male who is here for 6 mo f/u DM 2, HTN, and CRI. HbA1c at nephrologist's office recently was 7.7% on 11/05/13. Cr was 1.62 (CrCl 44 ml/min)--pretty much stable.  Lytes and PTH fine.  No burning, tingling, or numbness in feet. He is thinking about retiring in June this year.  Says 1/2 of a 20mg  cialis helps his ED.  ROS: no CP, no SOB, no palpitations, no dizziness.  Pertinent PMH:  Past medical, surgical, social, and family history reviewed and no changes are noted since last office visit.  MEDS: Not taking levaquin, vicodin, or tramadol. Outpatient Prescriptions Prior to Visit  Medication Sig Dispense Refill  . amLODipine (NORVASC) 10 MG tablet Take 1 tablet (10 mg total) by mouth daily.  30 tablet  6  . aspirin EC 81 MG tablet Take 81 mg by mouth daily.      . carvedilol (COREG) 6.25 MG tablet Take 1 tablet (6.25 mg total) by mouth 2 (two) times daily with a meal. Take 1/2 tablet in the AM and 1 tablet in the PM  45 tablet  3  . furosemide (LASIX) 40 MG tablet Take 1 tablet (40 mg total) by mouth daily.  30 tablet  6  . losartan (COZAAR) 100 MG tablet Take 1 tablet (100 mg total) by mouth daily.  30 tablet  3  . glipiZIDE (GLUCOTROL XL) 5 MG 24 hr tablet Take 1 tablet (5 mg total) by mouth daily.  90 tablet  1  . HYDROcodone-acetaminophen (NORCO/VICODIN) 5-325 MG per tablet 1-2 tabs po q6h prn pain  30 tablet  0  . tadalafil (CIALIS) 20 MG tablet Take 0.5 tablets (10 mg total) by mouth daily as needed for erectile dysfunction.  10 tablet  3  . traMADol (ULTRAM) 50 MG tablet TAKE ONE TO TWO TABLETS BY MOUTH EVERY 6 HOURS AS NEEDED FOR PAIN  30 tablet  0  . levofloxacin (LEVAQUIN) 750 MG tablet 1 tab po every other day for 14 doses  14 tablet  0   No facility-administered medications prior to visit.    PE: Blood pressure 129/68, pulse 59,  temperature 98.8 F (37.1 C), temperature source Temporal, resp. rate 18, height 5' 5.5" (1.664 m), weight 179 lb (81.194 kg), SpO2 96.00%. Gen: Alert, well appearing.  Patient is oriented to person, place, time, and situation. Foot exam - bilateral normal; no swelling, tenderness or skin or vascular lesions. Color and temperature is normal. Sensation is intact. Peripheral pulses are palpable. Toenails are normal.   IMPRESSION AND PLAN:  Type II or unspecified type diabetes mellitus without mention of complication, uncontrolled Continue glucotrol XL 5mg  qd and add actos 15mg  qd. Check urine microalbumin/cr today. Feet exam normal today. HbA1c UTD via nephrologist's office 11/05/13 (7.7%).  HTN (hypertension), benign The current medical regimen is effective;  continue present plan and medications.   Chronic renal insufficiency, stage III (moderate) Stable. He'll continue with annual f/u with Dr. Mercy Moore.    An After Visit Summary was printed and given to the patient.  FOLLOW UP: 26mo

## 2013-11-25 NOTE — Assessment & Plan Note (Signed)
The current medical regimen is effective;  continue present plan and medications.  

## 2013-11-25 NOTE — Assessment & Plan Note (Signed)
Continue glucotrol XL 5mg  qd and add actos 15mg  qd. Check urine microalbumin/cr today. Feet exam normal today. HbA1c UTD via nephrologist's office 11/05/13 (7.7%).

## 2013-11-25 NOTE — Assessment & Plan Note (Signed)
Stable. He'll continue with annual f/u with Dr. Mercy Moore.

## 2013-11-27 ENCOUNTER — Telehealth: Payer: Self-pay | Admitting: Family Medicine

## 2013-11-27 NOTE — Telephone Encounter (Signed)
Relevant patient education mailed to patient.  

## 2013-11-27 NOTE — Telephone Encounter (Signed)
Relevant patient education assigned to patient using Emmi. ° °

## 2013-11-30 ENCOUNTER — Telehealth: Payer: Self-pay | Admitting: Family Medicine

## 2013-11-30 MED ORDER — CARVEDILOL 6.25 MG PO TABS: 6.2500 mg | ORAL_TABLET | Freq: Two times a day (BID) | ORAL | Status: DC

## 2013-11-30 NOTE — Telephone Encounter (Signed)
Patient LMOM stating [facility] never got Rx for his carvedilol and he wanted to know why.  I looked through patient's chart and there was no sign that [facility] sent a request for rx refill.  Sent in carvedilol 30 day supply x 3 refills per [facility] protocol.  Tried to contact patient to let him know and there was no answer.

## 2013-12-02 MED ORDER — CARVEDILOL 6.25 MG PO TABS
ORAL_TABLET | ORAL | Status: DC
Start: 2013-12-02 — End: 2014-03-30

## 2013-12-02 NOTE — Telephone Encounter (Signed)
Please advise which coreg sig is correct.  1 BID or 1/2 tab am 1 tab pm.

## 2013-12-02 NOTE — Telephone Encounter (Signed)
Medication sent to pharmacy per Dr. Anitra Lauth instructions.  Patient aware.

## 2013-12-02 NOTE — Telephone Encounter (Signed)
Should be 1/2 tab in morning and 1 whole tab in evening, #45 per month, RF x 6.

## 2014-02-18 ENCOUNTER — Other Ambulatory Visit: Payer: Self-pay | Admitting: Family Medicine

## 2014-02-18 MED ORDER — AMLODIPINE BESYLATE 10 MG PO TABS: 10.0000 mg | ORAL_TABLET | Freq: Every day | ORAL | Status: DC

## 2014-02-23 ENCOUNTER — Other Ambulatory Visit: Payer: Self-pay

## 2014-02-23 MED ORDER — FUROSEMIDE 40 MG PO TABS: 40.0000 mg | ORAL_TABLET | Freq: Every day | ORAL | Status: DC

## 2014-03-25 ENCOUNTER — Ambulatory Visit (INDEPENDENT_AMBULATORY_CARE_PROVIDER_SITE_OTHER): Payer: BC Managed Care – HMO | Admitting: Family Medicine

## 2014-03-25 ENCOUNTER — Encounter: Payer: Self-pay | Admitting: Family Medicine

## 2014-03-25 VITALS — BP 131/74 | HR 63 | Temp 98.4°F | Resp 18 | Ht 65.5 in | Wt 184.0 lb

## 2014-03-25 DIAGNOSIS — IMO0001 Reserved for inherently not codable concepts without codable children: Secondary | ICD-10-CM

## 2014-03-25 DIAGNOSIS — E1165 Type 2 diabetes mellitus with hyperglycemia: Principal | ICD-10-CM

## 2014-03-25 DIAGNOSIS — N183 Chronic kidney disease, stage 3 unspecified: Secondary | ICD-10-CM

## 2014-03-25 DIAGNOSIS — I10 Essential (primary) hypertension: Secondary | ICD-10-CM

## 2014-03-25 DIAGNOSIS — F17200 Nicotine dependence, unspecified, uncomplicated: Secondary | ICD-10-CM

## 2014-03-25 LAB — HEMOGLOBIN A1C: HEMOGLOBIN A1C: 6.2 % (ref 4.6–6.5)

## 2014-03-25 LAB — BASIC METABOLIC PANEL
BUN: 29 mg/dL — ABNORMAL HIGH (ref 6–23)
CHLORIDE: 99 meq/L (ref 96–112)
CO2: 21 meq/L (ref 19–32)
Calcium: 9.5 mg/dL (ref 8.4–10.5)
Creatinine, Ser: 2.4 mg/dL — ABNORMAL HIGH (ref 0.4–1.5)
GFR: 28.76 mL/min — ABNORMAL LOW (ref >60.00)
Glucose, Bld: 86 mg/dL (ref 70–99)
POTASSIUM: 3.9 meq/L (ref 3.5–5.1)
Sodium: 132 mEq/L — ABNORMAL LOW (ref 135–145)

## 2014-03-25 NOTE — Progress Notes (Signed)
OFFICE NOTE  03/25/2014  CC:  Chief Complaint  Patient presents with  . Follow-up    4 month     HPI: Patient is a 68 y.o. Caucasian male who is here for 4 mo f/u. Added actos 15mg  qd to regimen in feb 2015 due to A1c up a little over 7%. Reports fastings in 47s, up to 115 later in the day.  He is checking gluc every few days. BP checks every few days also normal: 120/70s. Still smoking and trying to cut back.  Pertinent PMH:  Past medical, surgical, social, and family history reviewed and no changes are noted since last office visit.  MEDS:  Outpatient Prescriptions Prior to Visit  Medication Sig Dispense Refill  . amLODipine (NORVASC) 10 MG tablet Take 1 tablet (10 mg total) by mouth daily.  30 tablet  3  . aspirin EC 81 MG tablet Take 81 mg by mouth daily.      . carvedilol (COREG) 6.25 MG tablet Take 1/2 tablet in the AM and 1 tablet in the PM  45 tablet  3  . furosemide (LASIX) 40 MG tablet Take 1 tablet (40 mg total) by mouth daily.  30 tablet  2  . glipiZIDE (GLUCOTROL XL) 5 MG 24 hr tablet Take 1 tablet (5 mg total) by mouth daily.  90 tablet  3  . losartan (COZAAR) 100 MG tablet Take 1 tablet (100 mg total) by mouth daily.  30 tablet  3  . pioglitazone (ACTOS) 15 MG tablet Take 1 tablet (15 mg total) by mouth daily.  30 tablet  6  . tadalafil (CIALIS) 20 MG tablet Take 0.5 tablets (10 mg total) by mouth daily as needed for erectile dysfunction.  5 tablet  3   No facility-administered medications prior to visit.    PE: Blood pressure 131/74, pulse 63, temperature 98.4 F (36.9 C), temperature source Temporal, resp. rate 18, height 5' 5.5" (1.664 m), weight 184 lb (83.462 kg), SpO2 96.00%. Gen: Alert, well appearing.  Patient is oriented to person, place, time, and situation. GDJ:MEQA: no injection, icteris, swelling, or exudate.  EOMI, PERRLA. Mouth: lips without lesion/swelling.  Oral mucosa pink and moist. Oropharynx without erythema, exudate, or swelling.  CV:  RRR, no m/r/g.   LUNGS: CTA bilat, nonlabored resps, good aeration in all lung fields. EXT: no clubbing, cyanosis, or edema.   LAB: none today  IMPRESSION AND PLAN:  1) DM 2: good home glucoses. Repeat a1c today.  2) HTN: The current medical regimen is effective;  continue present plan and medications. Lytes/Cr today.  3) CRI: avoid NSAIDs.  Lytes/Cr today. Has annual f/u with nephrologist, Dr. Mercy Moore, planned for 11/2014.  4) Tob dep: encouraged total cessation.  An After Visit Summary was printed and given to the patient.  FOLLOW UP: 4 mo

## 2014-03-25 NOTE — Progress Notes (Signed)
Pre visit review using our clinic review tool, if applicable. No additional management support is needed unless otherwise documented below in the visit note. 

## 2014-03-26 ENCOUNTER — Other Ambulatory Visit: Payer: Self-pay | Admitting: Family Medicine

## 2014-03-26 DIAGNOSIS — N183 Chronic kidney disease, stage 3 unspecified: Secondary | ICD-10-CM

## 2014-03-30 ENCOUNTER — Other Ambulatory Visit: Payer: Self-pay | Admitting: Family Medicine

## 2014-03-30 MED ORDER — CARVEDILOL 6.25 MG PO TABS: ORAL_TABLET | ORAL | Status: DC

## 2014-04-08 ENCOUNTER — Other Ambulatory Visit (INDEPENDENT_AMBULATORY_CARE_PROVIDER_SITE_OTHER): Payer: BC Managed Care – HMO

## 2014-04-08 DIAGNOSIS — N183 Chronic kidney disease, stage 3 unspecified: Secondary | ICD-10-CM

## 2014-04-08 LAB — BASIC METABOLIC PANEL
BUN: 21 mg/dL (ref 6–23)
CO2: 22 meq/L (ref 19–32)
Calcium: 9.3 mg/dL (ref 8.4–10.5)
Chloride: 100 mEq/L (ref 96–112)
Creatinine, Ser: 2.4 mg/dL — ABNORMAL HIGH (ref 0.4–1.5)
GFR: 28.48 mL/min — ABNORMAL LOW (ref >60.00)
GLUCOSE: 69 mg/dL — AB (ref 70–99)
POTASSIUM: 4.7 meq/L (ref 3.5–5.1)
SODIUM: 127 meq/L — AB (ref 135–145)

## 2014-04-13 ENCOUNTER — Other Ambulatory Visit: Payer: Self-pay | Admitting: Family Medicine

## 2014-04-13 DIAGNOSIS — E871 Hypo-osmolality and hyponatremia: Secondary | ICD-10-CM

## 2014-04-19 ENCOUNTER — Other Ambulatory Visit (INDEPENDENT_AMBULATORY_CARE_PROVIDER_SITE_OTHER): Payer: BC Managed Care – HMO

## 2014-04-19 DIAGNOSIS — E871 Hypo-osmolality and hyponatremia: Secondary | ICD-10-CM

## 2014-04-20 ENCOUNTER — Other Ambulatory Visit: Payer: BC Managed Care – HMO

## 2014-04-20 LAB — BASIC METABOLIC PANEL
BUN: 19 mg/dL (ref 6–23)
CALCIUM: 9.1 mg/dL (ref 8.4–10.5)
CHLORIDE: 99 meq/L (ref 96–112)
CO2: 25 meq/L (ref 19–32)
CREATININE: 2.3 mg/dL — AB (ref 0.4–1.5)
GFR: 30.96 mL/min — ABNORMAL LOW (ref >60.00)
GLUCOSE: 57 mg/dL — AB (ref 70–99)
Potassium: 4.1 mEq/L (ref 3.5–5.1)
Sodium: 128 mEq/L — ABNORMAL LOW (ref 135–145)

## 2014-05-18 ENCOUNTER — Encounter: Payer: Self-pay | Admitting: Family Medicine

## 2014-05-18 ENCOUNTER — Ambulatory Visit (INDEPENDENT_AMBULATORY_CARE_PROVIDER_SITE_OTHER): Payer: Medicare Other | Admitting: Family Medicine

## 2014-05-18 VITALS — BP 153/72 | HR 42 | Temp 97.6°F | Resp 18 | Ht 65.5 in | Wt 183.0 lb

## 2014-05-18 DIAGNOSIS — J209 Acute bronchitis, unspecified: Secondary | ICD-10-CM

## 2014-05-18 DIAGNOSIS — F17200 Nicotine dependence, unspecified, uncomplicated: Secondary | ICD-10-CM

## 2014-05-18 DIAGNOSIS — Z0000 Encounter for general adult medical examination without abnormal findings: Secondary | ICD-10-CM | POA: Insufficient documentation

## 2014-05-18 DIAGNOSIS — J069 Acute upper respiratory infection, unspecified: Secondary | ICD-10-CM

## 2014-05-18 DIAGNOSIS — H6123 Impacted cerumen, bilateral: Secondary | ICD-10-CM

## 2014-05-18 DIAGNOSIS — N183 Chronic kidney disease, stage 3 unspecified: Secondary | ICD-10-CM

## 2014-05-18 DIAGNOSIS — H6120 Impacted cerumen, unspecified ear: Secondary | ICD-10-CM

## 2014-05-18 DIAGNOSIS — Z23 Encounter for immunization: Secondary | ICD-10-CM

## 2014-05-18 MED ORDER — HYDROCODONE-HOMATROPINE 5-1.5 MG/5ML PO SYRP: ORAL_SOLUTION | ORAL | Status: DC

## 2014-05-18 MED ORDER — PREDNISONE 20 MG PO TABS: ORAL_TABLET | ORAL | Status: DC

## 2014-05-18 MED ORDER — AZITHROMYCIN 250 MG PO TABS: ORAL_TABLET | ORAL | Status: DC

## 2014-05-18 NOTE — Progress Notes (Signed)
Pre visit review using our clinic review tool, if applicable. No additional management support is needed unless otherwise documented below in the visit note. 

## 2014-05-18 NOTE — Progress Notes (Signed)
OFFICE NOTE  05/18/2014  CC:  Chief Complaint  Patient presents with  . chest congestion  . Cough   HPI: Patient is a 68 y.o. Caucasian male, long time smoker who is here for cough. Sick for 3 wks now, started with nasal congestion/nose running, cough.  The cough has persisted.  No fevers. Feels discomfort in back of both lung regions, esp when coughing.  Feels some DOE--recently with playing golf.  Occ dizziness.  No n/v/d. Mucinex OTC being tried but not that helpful.  "I feel pretty crappy".  Pertinent PMH:  Past medical, surgical, social, and family history reviewed and no changes are noted since last office visit.  MEDS:  Outpatient Prescriptions Prior to Visit  Medication Sig Dispense Refill  . amLODipine (NORVASC) 10 MG tablet Take 1 tablet (10 mg total) by mouth daily.  30 tablet  3  . aspirin EC 81 MG tablet Take 81 mg by mouth daily.      . carvedilol (COREG) 6.25 MG tablet Take 1/2 tablet in the AM and 1 tablet in the PM  45 tablet  3  . furosemide (LASIX) 40 MG tablet Take 1 tablet (40 mg total) by mouth daily.  30 tablet  2  . glipiZIDE (GLUCOTROL XL) 5 MG 24 hr tablet Take 1 tablet (5 mg total) by mouth daily.  90 tablet  3  . losartan (COZAAR) 100 MG tablet Take 1 tablet (100 mg total) by mouth daily.  30 tablet  3  . pioglitazone (ACTOS) 15 MG tablet Take 1 tablet (15 mg total) by mouth daily.  30 tablet  6  . tadalafil (CIALIS) 20 MG tablet Take 0.5 tablets (10 mg total) by mouth daily as needed for erectile dysfunction.  5 tablet  3   No facility-administered medications prior to visit.    PE: Blood pressure 153/72, pulse 42, temperature 97.6 F (36.4 C), temperature source Temporal, resp. rate 18, height 5' 5.5" (1.664 m), weight 183 lb (83.008 kg), SpO2 97.00%. VS: noted--normal. Gen: alert, NAD, NONTOXIC APPEARING. AFFECT: pleasant, lucid thought and speech. HEENT: eyes without injection, drainage, or swelling.  Ears: EACs full of brown cerumen AU--this was  successfully cleared out 100% by CMA with irrigation today, TMs with normal light reflex and landmarks.  Nose: Clear rhinorrhea, with some dried, crusty exudate adherent to mildly injected mucosa.  No purulent d/c.  No paranasal sinus TTP.  No facial swelling.  Throat and mouth without focal lesion.  No pharyngial swelling, erythema, or exudate.   Neck: supple, no LAD.   LUNGS: Nonlabored resps.  He has coarse insp/exp rhonchi but these clear with coughing.  Good aeration.  No prolongation of exp phase. CV: Regular, bradycardic, no m/r/g. EXT: no c/c/e SKIN: no rash  IMPRESSION AND PLAN:  1) Prolonged acute bronchitis, resolving URI. Prednisone 40mg  qd x 5d. Azithromycin 500mg  x 1d, then 250mg  qd x 4d. Continue mucinex DM qAM and I'll start hycodan syrup 1-2 tsp qhs prn (#120 ml). Encouraged smoking cessation.  2) Bilat cerumen impaction: irrigated completely clear by CMA today.  3) CRI; recent worsening of baseline Cr.  He saw Dr. Mercy Moore today and got some blood work and UA.  Plan is for q81mo nephrology f/u for a while to see if things progress or stabilize.  4) Prev health care: prevnar 13 IM today.  Signs/symptoms to call or return for were reviewed and pt expressed understanding.  An After Visit Summary was printed and given to the patient.  FOLLOW  UP: prn

## 2014-05-18 NOTE — Patient Instructions (Signed)
Take over the counter mucinex DM every morning.  Take hydrocodone cough syrup at night-time only.   May stop these meds when feeling cough much improved.

## 2014-05-31 ENCOUNTER — Ambulatory Visit (INDEPENDENT_AMBULATORY_CARE_PROVIDER_SITE_OTHER): Payer: Medicare Other | Admitting: Family Medicine

## 2014-05-31 ENCOUNTER — Encounter: Payer: Self-pay | Admitting: Family Medicine

## 2014-05-31 VITALS — BP 128/70 | HR 53 | Temp 98.2°F | Resp 20 | Ht 65.5 in | Wt 183.0 lb

## 2014-05-31 DIAGNOSIS — R300 Dysuria: Secondary | ICD-10-CM

## 2014-05-31 DIAGNOSIS — J209 Acute bronchitis, unspecified: Secondary | ICD-10-CM

## 2014-05-31 DIAGNOSIS — N3000 Acute cystitis without hematuria: Secondary | ICD-10-CM

## 2014-05-31 DIAGNOSIS — N3001 Acute cystitis with hematuria: Secondary | ICD-10-CM

## 2014-05-31 DIAGNOSIS — Z23 Encounter for immunization: Secondary | ICD-10-CM

## 2014-05-31 DIAGNOSIS — Z87440 Personal history of urinary (tract) infections: Secondary | ICD-10-CM

## 2014-05-31 LAB — POCT URINALYSIS DIPSTICK
Bilirubin, UA: NEGATIVE
KETONES UA: NEGATIVE
Nitrite, UA: NEGATIVE
PH UA: 5.5
Urobilinogen, UA: 0.2

## 2014-05-31 MED ORDER — CIPROFLOXACIN HCL 250 MG PO TABS: 250.0000 mg | ORAL_TABLET | Freq: Two times a day (BID) | ORAL | Status: DC

## 2014-05-31 NOTE — Progress Notes (Signed)
Pre visit review using our clinic review tool, if applicable. No additional management support is needed unless otherwise documented below in the visit note. 

## 2014-05-31 NOTE — Progress Notes (Signed)
OFFICE NOTE  05/31/2014  CC:  Chief Complaint  Patient presents with  . Dysuria    x Saturday  . Cough   HPI: Patient is a 68 y.o. Caucasian male who is here for about 5d hx of malaise and 2 d of burning with urination, hurting over suprapubic area.  Recently was constipated, took OTC stool softener and it helped.  When he was constipated he was having urinary frequency but incomplete emptying.  He is emptying out bladder now since having a good bowel evacuation. He is back on lasix per his nephrologist but he didn't take it yest or today b/c this increased frequency and made his burning more bothersome.  No fever.  Mild nauseated feeling but no vomiting.  I saw him 05/18/14 for acute bronchitis: Z pack and pred x 5d rx'd.  He feels much improved, minimal cough still, no SOB or wheeze.  Pertinent PMH:  Past medical, surgical, social, and family history reviewed and no changes are noted since last office visit.  MEDS: Not taking prednisone, z-pack, or hycodan listed below Outpatient Prescriptions Prior to Visit  Medication Sig Dispense Refill  . amLODipine (NORVASC) 10 MG tablet Take 1 tablet (10 mg total) by mouth daily.  30 tablet  3  . aspirin EC 81 MG tablet Take 81 mg by mouth daily.      . carvedilol (COREG) 6.25 MG tablet Take 1/2 tablet in the AM and 1 tablet in the PM  45 tablet  3  . furosemide (LASIX) 40 MG tablet Take 1 tablet (40 mg total) by mouth daily.  30 tablet  2  . glipiZIDE (GLUCOTROL XL) 5 MG 24 hr tablet Take 1 tablet (5 mg total) by mouth daily.  90 tablet  3  . losartan (COZAAR) 100 MG tablet Take 1 tablet (100 mg total) by mouth daily.  30 tablet  3  . pioglitazone (ACTOS) 15 MG tablet Take 1 tablet (15 mg total) by mouth daily.  30 tablet  6  . tadalafil (CIALIS) 20 MG tablet Take 0.5 tablets (10 mg total) by mouth daily as needed for erectile dysfunction.  5 tablet  3  . azithromycin (ZITHROMAX) 250 MG tablet 2 tabs po qd x 1d, then 1 tab po qd x 4d  6 tablet   0  . predniSONE (DELTASONE) 20 MG tablet 2 tabs po qd x 5d  10 tablet  0  . HYDROcodone-homatropine (HYCODAN) 5-1.5 MG/5ML syrup 1-2 tsp po qhs prn cough  120 mL  0   No facility-administered medications prior to visit.    PE: Blood pressure 128/70, pulse 53, temperature 98.2 F (36.8 C), temperature source Temporal, resp. rate 20, height 5' 5.5" (1.664 m), weight 183 lb (83.008 kg), SpO2 98.00%. Gen: Alert, well appearing.  Patient is oriented to person, place, time, and situation. AFFECT: pleasant, lucid thought and speech. CV: RRR, no m/r/g.   LUNGS: CTA bilat, nonlabored resps, good aeration in all lung fields. Abd: soft, NT/ND  LAB: UA today showed mod blood, large LEU, glucose 100 mg/dl  IMPRESSION AND PLAN:  1) UTI, complicated by hx of BPH and recent constipation. His last UTI cleared up with cipro 250mg  bid x 5d (e coli) so I rx'd that again today. Send urine for c/s today.  2) Recent episode of bronchitis: resolving appropriately.  3) Prev health care: Flu vaccine IM today.  An After Visit Summary was printed and given to the patient.  FOLLOW UP: prn

## 2014-06-02 LAB — URINE CULTURE: Colony Count: 50000

## 2014-06-04 ENCOUNTER — Telehealth: Payer: Self-pay | Admitting: Family Medicine

## 2014-06-04 MED ORDER — LOSARTAN POTASSIUM 100 MG PO TABS: 100.0000 mg | ORAL_TABLET | Freq: Every day | ORAL | Status: DC

## 2014-06-04 MED ORDER — CEFDINIR 300 MG PO CAPS: 300.0000 mg | ORAL_CAPSULE | Freq: Two times a day (BID) | ORAL | Status: DC

## 2014-06-04 NOTE — Telephone Encounter (Signed)
Patient aware.

## 2014-06-04 NOTE — Telephone Encounter (Signed)
Please advise 

## 2014-06-04 NOTE — Telephone Encounter (Signed)
OK. Finish up current antibiotic. Then pick up rx I sent to pharmacy today for cefdinir to take for 10 more days (different antibiotic). I sent in RF for losartan. Return if not feeling any improved in 5-7d.-thx

## 2014-06-04 NOTE — Telephone Encounter (Signed)
Patient is not feeling any better and is requesting a refill of more antiobiotics.  Patient is also requesting losartan

## 2014-06-16 ENCOUNTER — Other Ambulatory Visit: Payer: Self-pay | Admitting: Family Medicine

## 2014-06-16 MED ORDER — AMLODIPINE BESYLATE 10 MG PO TABS: 10.0000 mg | ORAL_TABLET | Freq: Every day | ORAL | Status: DC

## 2014-06-23 ENCOUNTER — Other Ambulatory Visit: Payer: Self-pay | Admitting: Family Medicine

## 2014-06-23 MED ORDER — PIOGLITAZONE HCL 15 MG PO TABS: 15.0000 mg | ORAL_TABLET | Freq: Every day | ORAL | Status: DC

## 2014-07-16 ENCOUNTER — Encounter: Payer: Self-pay | Admitting: Family Medicine

## 2014-07-16 ENCOUNTER — Ambulatory Visit (INDEPENDENT_AMBULATORY_CARE_PROVIDER_SITE_OTHER): Payer: Medicare Other | Admitting: Family Medicine

## 2014-07-16 VITALS — BP 135/80 | HR 65 | Temp 98.0°F | Resp 18 | Ht 65.5 in | Wt 181.0 lb

## 2014-07-16 DIAGNOSIS — N390 Urinary tract infection, site not specified: Secondary | ICD-10-CM

## 2014-07-16 DIAGNOSIS — R300 Dysuria: Secondary | ICD-10-CM

## 2014-07-16 DIAGNOSIS — K5900 Constipation, unspecified: Secondary | ICD-10-CM

## 2014-07-16 DIAGNOSIS — N183 Chronic kidney disease, stage 3 unspecified: Secondary | ICD-10-CM

## 2014-07-16 DIAGNOSIS — E1122 Type 2 diabetes mellitus with diabetic chronic kidney disease: Secondary | ICD-10-CM | POA: Insufficient documentation

## 2014-07-16 DIAGNOSIS — N3000 Acute cystitis without hematuria: Secondary | ICD-10-CM

## 2014-07-16 DIAGNOSIS — N189 Chronic kidney disease, unspecified: Secondary | ICD-10-CM

## 2014-07-16 DIAGNOSIS — N1831 Chronic kidney disease, stage 3a: Secondary | ICD-10-CM | POA: Insufficient documentation

## 2014-07-16 DIAGNOSIS — I10 Essential (primary) hypertension: Secondary | ICD-10-CM

## 2014-07-16 DIAGNOSIS — E1129 Type 2 diabetes mellitus with other diabetic kidney complication: Secondary | ICD-10-CM | POA: Insufficient documentation

## 2014-07-16 DIAGNOSIS — K5909 Other constipation: Secondary | ICD-10-CM | POA: Insufficient documentation

## 2014-07-16 LAB — POCT URINALYSIS DIPSTICK
Bilirubin, UA: NEGATIVE
GLUCOSE UA: 100
KETONES UA: NEGATIVE
Nitrite, UA: NEGATIVE
PROTEIN UA: NEGATIVE
Spec Grav, UA: 1.01
Urobilinogen, UA: 0.2
pH, UA: 6

## 2014-07-16 MED ORDER — FUROSEMIDE 40 MG PO TABS: 40.0000 mg | ORAL_TABLET | Freq: Every day | ORAL | Status: DC

## 2014-07-16 MED ORDER — CIPROFLOXACIN HCL 250 MG PO TABS: 250.0000 mg | ORAL_TABLET | Freq: Two times a day (BID) | ORAL | Status: DC

## 2014-07-16 NOTE — Progress Notes (Signed)
OFFICE NOTE  07/16/2014  CC:  Chief Complaint  Patient presents with  . Dysuria   HPI: Patient is a 68 y.o. Caucasian male who is here for 4 mo f/u HTN, CRI stage 3, DM 2. Onset 2 days ago of increased urination, urinary urgency, burning with urination.  No fever.  No n/v or malaise. We felt like his UTI 2 mo ago came after a period of constipation with associated increased urinary frequency and incomplete emptying.  Again, recently he has had another bout of constipation but has emptied bowel using an OTC stool softener.  Usually, he has no lower urinary tract obstructive sx's.  Otherwise has been feeling really well, no more leg pains. Has f/u with Dr. Mercy Moore early next week, at which time he'll get repeat labs. All meds have been kept the same at his office.  Gluc's usually 90s fasting Last 2 d 114, 124 fasting.   Pertinent PMH:  Past medical, surgical, social, and family history reviewed and no changes are noted since last office visit.  MEDS: Not on omnicef listed below Outpatient Prescriptions Prior to Visit  Medication Sig Dispense Refill  . amLODipine (NORVASC) 10 MG tablet Take 1 tablet (10 mg total) by mouth daily.  30 tablet  3  . aspirin EC 81 MG tablet Take 81 mg by mouth daily.      . carvedilol (COREG) 6.25 MG tablet Take 1/2 tablet in the AM and 1 tablet in the PM  45 tablet  3  . glipiZIDE (GLUCOTROL XL) 5 MG 24 hr tablet Take 1 tablet (5 mg total) by mouth daily.  90 tablet  3  . losartan (COZAAR) 100 MG tablet Take 1 tablet (100 mg total) by mouth daily.  30 tablet  6  . pioglitazone (ACTOS) 15 MG tablet Take 1 tablet (15 mg total) by mouth daily.  30 tablet  3  . furosemide (LASIX) 40 MG tablet Take 1 tablet (40 mg total) by mouth daily.  30 tablet  2  . tadalafil (CIALIS) 20 MG tablet Take 0.5 tablets (10 mg total) by mouth daily as needed for erectile dysfunction.  5 tablet  3  . cefdinir (OMNICEF) 300 MG capsule Take 1 capsule (300 mg total) by mouth 2  (two) times daily.  20 capsule  0   No facility-administered medications prior to visit.    PE: Blood pressure 135/80, pulse 65, temperature 98 F (36.7 C), temperature source Temporal, resp. rate 18, height 5' 5.5" (1.664 m), weight 181 lb (82.101 kg), SpO2 98.00%. Gen: Alert, well appearing.  Patient is oriented to person, place, time, and situation. AFFECT: pleasant, lucid thought and speech. No further exam today  LAB: gluc 100 mg/dl, small blood, small LEU  IMPRESSION AND PLAN:  Acute cystitis Cipro 250mg  bid x 5d. Send urine for c/s. Will try to get constipation resolved and see if this helps stop his recurrent UTI's.  Type II diabetes mellitus with renal manifestations The current medical regimen is effective;  continue present plan and medications. Lab Results  Component Value Date   HGBA1C 6.2 03/25/2014  Control has been real good. Will wait until next f/u to do repeat HbA1c. Feet exam next f/u.    HTN (hypertension), benign The current medical regimen is effective;  continue present plan and medications.   Chronic renal insufficiency, stage III (moderate) Plan for now is closer f/u with Dr. Mercy Moore, next appt next week--lab repeat at that time per pt.  Constipation, chronic Start OTC  senakot S generic, 2 tabs po qhs.   An After Visit Summary was printed and given to the patient.  FOLLOW UP: 4 mo

## 2014-07-16 NOTE — Assessment & Plan Note (Addendum)
The current medical regimen is effective;  continue present plan and medications. Lab Results  Component Value Date   HGBA1C 6.2 03/25/2014  Control has been real good. Will wait until next f/u to do repeat HbA1c. Feet exam next f/u.

## 2014-07-16 NOTE — Assessment & Plan Note (Signed)
Start OTC senakot S generic, 2 tabs po qhs.

## 2014-07-16 NOTE — Assessment & Plan Note (Signed)
The current medical regimen is effective;  continue present plan and medications.  

## 2014-07-16 NOTE — Assessment & Plan Note (Signed)
Cipro 250mg  bid x 5d. Send urine for c/s. Will try to get constipation resolved and see if this helps stop his recurrent UTI's.

## 2014-07-16 NOTE — Patient Instructions (Signed)
Buy OTC generic for senakot S (senna plus) and take 2 tabs every night.

## 2014-07-16 NOTE — Assessment & Plan Note (Signed)
Plan for now is closer f/u with Dr. Mercy Moore, next appt next week--lab repeat at that time per pt.

## 2014-07-16 NOTE — Progress Notes (Signed)
Pre visit review using our clinic review tool, if applicable. No additional management support is needed unless otherwise documented below in the visit note. 

## 2014-07-18 LAB — URINE CULTURE

## 2014-07-19 ENCOUNTER — Telehealth: Payer: Self-pay | Admitting: Family Medicine

## 2014-07-19 NOTE — Telephone Encounter (Signed)
emmi mailed  °

## 2014-07-26 ENCOUNTER — Encounter: Payer: Self-pay | Admitting: Family Medicine

## 2014-07-26 ENCOUNTER — Other Ambulatory Visit: Payer: Self-pay | Admitting: Family Medicine

## 2014-07-26 ENCOUNTER — Ambulatory Visit: Payer: BC Managed Care – HMO | Admitting: Family Medicine

## 2014-07-26 MED ORDER — CARVEDILOL 6.25 MG PO TABS
ORAL_TABLET | ORAL | Status: DC
Start: 2014-07-26 — End: 2014-11-17

## 2014-07-26 NOTE — Telephone Encounter (Signed)
Pt LMOM needing RF on cavedilol.  RX sent into pharmacy x 3 rfs per patient request.

## 2014-10-01 DIAGNOSIS — E785 Hyperlipidemia, unspecified: Secondary | ICD-10-CM

## 2014-10-01 HISTORY — DX: Hyperlipidemia, unspecified: E78.5

## 2014-10-13 ENCOUNTER — Other Ambulatory Visit: Payer: Self-pay | Admitting: Family Medicine

## 2014-10-18 ENCOUNTER — Other Ambulatory Visit: Payer: Self-pay | Admitting: Family Medicine

## 2014-11-10 ENCOUNTER — Encounter: Payer: Self-pay | Admitting: Family Medicine

## 2014-11-15 ENCOUNTER — Telehealth: Payer: Self-pay | Admitting: Family Medicine

## 2014-11-15 MED ORDER — AMLODIPINE BESYLATE 10 MG PO TABS: 10.0000 mg | ORAL_TABLET | Freq: Every day | ORAL | Status: DC

## 2014-11-15 MED ORDER — GLIPIZIDE ER 5 MG PO TB24: 5.0000 mg | ORAL_TABLET | Freq: Every day | ORAL | Status: DC

## 2014-11-15 NOTE — Telephone Encounter (Signed)
Amlodipine and glipizide eRx'd today.

## 2014-11-15 NOTE — Telephone Encounter (Signed)
Amlodipine & Glipizide [facility], patient has an appt Thursday

## 2014-11-16 ENCOUNTER — Encounter: Payer: Self-pay | Admitting: Gastroenterology

## 2014-11-17 ENCOUNTER — Ambulatory Visit: Payer: Medicare Other | Admitting: Family Medicine

## 2014-11-17 ENCOUNTER — Other Ambulatory Visit: Payer: Self-pay | Admitting: Family Medicine

## 2014-11-18 ENCOUNTER — Ambulatory Visit (INDEPENDENT_AMBULATORY_CARE_PROVIDER_SITE_OTHER): Payer: Self-pay | Admitting: Family Medicine

## 2014-11-18 ENCOUNTER — Encounter: Payer: Self-pay | Admitting: Family Medicine

## 2014-11-18 VITALS — BP 132/80 | HR 62 | Temp 98.4°F | Ht 65.5 in | Wt 190.0 lb

## 2014-11-18 DIAGNOSIS — I10 Essential (primary) hypertension: Secondary | ICD-10-CM

## 2014-11-18 DIAGNOSIS — E1122 Type 2 diabetes mellitus with diabetic chronic kidney disease: Secondary | ICD-10-CM

## 2014-11-18 DIAGNOSIS — E119 Type 2 diabetes mellitus without complications: Secondary | ICD-10-CM

## 2014-11-18 DIAGNOSIS — N189 Chronic kidney disease, unspecified: Secondary | ICD-10-CM

## 2014-11-18 MED ORDER — CARVEDILOL 6.25 MG PO TABS: ORAL_TABLET | ORAL | Status: DC

## 2014-11-18 MED ORDER — PIOGLITAZONE HCL 30 MG PO TABS: 30.0000 mg | ORAL_TABLET | Freq: Every day | ORAL | Status: DC

## 2014-11-18 MED ORDER — TADALAFIL 20 MG PO TABS: 10.0000 mg | ORAL_TABLET | Freq: Every day | ORAL | Status: DC | PRN

## 2014-11-18 NOTE — Progress Notes (Signed)
OFFICE NOTE  11/21/2014  CC:  Chief Complaint  Patient presents with  . Follow-up   HPI: Patient is a 69 y.o. Caucasian male who is here for f/u HTN, CRI stage III, DM 2. Reviewed Dr. Etheleen Nicks note from earlier this month, HbA1c was 7% (up from 6.5% at last A1c check), all other blood work stable.  DM 2: fasting 80s-120, usually around 100.  Doesn't check postprandials. Says he usually gains wt in winter, like this year, then loses it in spring and summer (gets down around 170).  BP checks at home 125/80 avg, HR usually 60s.  Feet: no burning, tingling, or numbness.  No hx of ulcer. Is overdue for diab retpthy eye screening: currently has no ophthalmologist.  He asks for RF of cialis, says this has worked well for his ED, without adverse side effects.   Pertinent PMH:  Past medical, surgical, social, and family history reviewed and no changes are noted since last office visit.  MEDS: Not taking cipro listed below Outpatient Prescriptions Prior to Visit  Medication Sig Dispense Refill  . amLODipine (NORVASC) 10 MG tablet Take 1 tablet (10 mg total) by mouth daily. 90 tablet 3  . aspirin EC 81 MG tablet Take 81 mg by mouth daily.    . furosemide (LASIX) 40 MG tablet Take 1 tablet (40 mg total) by mouth daily. 90 tablet 4  . glipiZIDE (GLUCOTROL XL) 5 MG 24 hr tablet Take 1 tablet (5 mg total) by mouth daily. 90 tablet 3  . losartan (COZAAR) 100 MG tablet Take 1 tablet (100 mg total) by mouth daily. 30 tablet 6  . carvedilol (COREG) 6.25 MG tablet TAKE ONE-HALF TABLET BY MOUTH IN THE MORNING, THEN TAKE ONE IN THE EVENING 45 tablet 0  . pioglitazone (ACTOS) 15 MG tablet TAKE ONE TABLET BY MOUTH ONCE DAILY 30 tablet 0  . tadalafil (CIALIS) 20 MG tablet Take 0.5 tablets (10 mg total) by mouth daily as needed for erectile dysfunction. 5 tablet 3  . ciprofloxacin (CIPRO) 250 MG tablet Take 1 tablet (250 mg total) by mouth 2 (two) times daily. (Patient not taking: Reported on  11/18/2014) 10 tablet 0   No facility-administered medications prior to visit.    PE: Blood pressure 132/80, pulse 62, temperature 98.4 F (36.9 C), temperature source Temporal, height 5' 5.5" (1.664 m), weight 190 lb (86.183 kg), SpO2 95 %. Gen: Alert, well appearing.  Patient is oriented to person, place, time, and situation. Foot exam - both normal in appearance; no swelling, tenderness or skin or vascular lesions. Color and temperature is normal. Sensation is intact. Peripheral pulses are palpable. Toenails are mildly thickened but otherwise normal.   IMPRESSION AND PLAN:  1) DM 2 with diabetic renal dz: A1c rose by 0.5% so I want to increase his pioglitazone to 30mg  qd, continue glucotrol xL 5mg  qAM. Foot exam today was good. He is overdue for his diabetic retinopathy screening exam: says he was pleased with [facility] in [place] when he got his catarct surgery so I referred him back to them today.  2) HTN; The current medical regimen is effective;  continue present plan and medications. Lytes/cr stable at most recent nephrology f/u earlier this month.  3) Erectile dysfunction; RF'd cialis rx today--printed per pt request.  An After Visit Summary was printed and given to the patient.   FOLLOW UP: 4 mo, 30 min fasting f/u at which time he'll need FLP and screening PSA/DRE.

## 2014-11-18 NOTE — Progress Notes (Signed)
Pre visit review using our clinic review tool, if applicable. No additional management support is needed unless otherwise documented below in the visit note. 

## 2014-11-18 NOTE — Patient Instructions (Signed)
I have ordered a referral to [facility] in [place] for you to get a screening exam for diabetic retinopathy.  You need to have this exam at least once a year.

## 2014-11-22 ENCOUNTER — Encounter: Payer: Self-pay | Admitting: Gastroenterology

## 2014-12-15 ENCOUNTER — Encounter: Payer: Self-pay | Admitting: Family Medicine

## 2014-12-15 ENCOUNTER — Ambulatory Visit (INDEPENDENT_AMBULATORY_CARE_PROVIDER_SITE_OTHER): Payer: Medicare Other | Admitting: Family Medicine

## 2014-12-15 VITALS — BP 114/70 | HR 61 | Temp 98.1°F | Ht 65.5 in | Wt 189.0 lb

## 2014-12-15 DIAGNOSIS — B349 Viral infection, unspecified: Secondary | ICD-10-CM

## 2014-12-15 DIAGNOSIS — J069 Acute upper respiratory infection, unspecified: Secondary | ICD-10-CM

## 2014-12-15 DIAGNOSIS — J208 Acute bronchitis due to other specified organisms: Secondary | ICD-10-CM

## 2014-12-15 MED ORDER — ALBUTEROL SULFATE HFA 108 (90 BASE) MCG/ACT IN AERS: 2.0000 | INHALATION_SPRAY | RESPIRATORY_TRACT | Status: DC | PRN

## 2014-12-15 MED ORDER — PREDNISONE 20 MG PO TABS: ORAL_TABLET | ORAL | Status: DC

## 2014-12-15 NOTE — Progress Notes (Signed)
OFFICE NOTE  12/15/2014  CC: "Feels like he has flu"  HPI: Patient is a 69 y.o. Caucasian male who is here for onset of ST, peri-orbital HA, cough/chest tightness, feels achy all over -4 days ago, subjective fever last night.  Mucinex DM being tried.  Progressively worsening. No chest pain or hemoptysis.  No n/v/d or rash.   Pertinent PMH:  Past medical, surgical, social, and family history reviewed and no changes are noted since last office visit.  MEDS:  Outpatient Prescriptions Prior to Visit  Medication Sig Dispense Refill  . amLODipine (NORVASC) 10 MG tablet Take 1 tablet (10 mg total) by mouth daily. 90 tablet 3  . aspirin EC 81 MG tablet Take 81 mg by mouth daily.    . carvedilol (COREG) 6.25 MG tablet TAKE ONE-HALF TABLET BY MOUTH IN THE MORNING, THEN TAKE ONE IN THE EVENING 45 tablet 11  . furosemide (LASIX) 40 MG tablet Take 1 tablet (40 mg total) by mouth daily. 90 tablet 4  . glipiZIDE (GLUCOTROL XL) 5 MG 24 hr tablet Take 1 tablet (5 mg total) by mouth daily. 90 tablet 3  . losartan (COZAAR) 100 MG tablet Take 1 tablet (100 mg total) by mouth daily. 30 tablet 6  . pioglitazone (ACTOS) 30 MG tablet Take 1 tablet (30 mg total) by mouth daily. 30 tablet 11  . tadalafil (CIALIS) 20 MG tablet Take 0.5 tablets (10 mg total) by mouth daily as needed for erectile dysfunction. 2 tablet 3   No facility-administered medications prior to visit.    PE: Blood pressure 114/70, pulse 61, temperature 98.1 F (36.7 C), temperature source Oral, height 5' 5.5" (1.664 m), weight 189 lb (85.73 kg), SpO2 93 %. VS: noted--normal. Gen: alert, NAD, NONTOXIC APPEARING. HEENT: eyes without injection, drainage, or swelling.  Ears: EACs clear, TMs with normal light reflex and landmarks.  Nose: Clear rhinorrhea, with some dried, crusty exudate adherent to mildly injected mucosa.  No purulent d/c.  No paranasal sinus TTP.  No facial swelling.  Throat and mouth without focal lesion.  No pharyngial  swelling, erythema, or exudate.   Neck: supple, no LAD.   LUNGS: CTA bilat, nonlabored resps.  I hear occasional coarse rhonchi with inspiration, subtle prolongation of expiratory phase.  No crackles or areas of focally diminished BS. CV: RRR, no m/r/g. EXT: no c/c/e SKIN: no rash  LABS: none  IMPRESSION AND PLAN:  Viral syndrome: URI + acute bronchitis, +mild amount of bronchospasm. He is a smoker but has cut down to nearly nothing. I don't think this is flu, nor do I see any sign of bacterial infection.  Prednisone 40mg  qd x 5d. Albuterol HFA 1-2 p q4h prn. Inhaler education done today by CMA Jacklynn Ganong.  An After Visit Summary was printed and given to the patient.  FOLLOW UP: prn

## 2014-12-15 NOTE — Progress Notes (Signed)
Pre visit review using our clinic review tool, if applicable. No additional management support is needed unless otherwise documented below in the visit note. 

## 2014-12-22 ENCOUNTER — Encounter: Payer: Self-pay | Admitting: Family Medicine

## 2014-12-22 ENCOUNTER — Telehealth: Payer: Self-pay | Admitting: *Deleted

## 2014-12-22 MED ORDER — AZITHROMYCIN 250 MG PO TABS
ORAL_TABLET | ORAL | Status: DC
Start: 2014-12-22 — End: 2014-12-30

## 2014-12-22 NOTE — Telephone Encounter (Signed)
Patient called and left vm stating that he is not feeling any better since he was seen in office 12/15/2014. Patient is requesting that a med be sent to the pharmacy for him. Please advise?

## 2014-12-22 NOTE — Telephone Encounter (Signed)
Z-pack eRx'd to pt's pharmacy.

## 2014-12-23 NOTE — Telephone Encounter (Signed)
Patient's wife informed

## 2014-12-30 ENCOUNTER — Telehealth: Payer: Self-pay | Admitting: Family Medicine

## 2014-12-30 MED ORDER — LEVOFLOXACIN 750 MG PO TABS: ORAL_TABLET | ORAL | Status: DC

## 2014-12-30 NOTE — Telephone Encounter (Signed)
Patient notified

## 2014-12-30 NOTE — Telephone Encounter (Signed)
Patient is requesting CB ASAP. He is having chills so bad his teeth are chattering, he is miserable. Can he get an Rx for something other than the Zpack?

## 2014-12-30 NOTE — Telephone Encounter (Signed)
Please advise? Patient was given z-pack 12/22/2014. Patient is still not better.

## 2014-12-30 NOTE — Telephone Encounter (Signed)
I sent eRx for levaquin; make sure he follows the instructions on the bottle (1 tab EVERY OTHER DAY) x 10 doses.  This dose adjustment is done b/c of his kidney insufficiency. If not improving in the next 48h he should follow up.-thx

## 2015-01-06 ENCOUNTER — Ambulatory Visit (AMBULATORY_SURGERY_CENTER): Payer: Self-pay | Admitting: *Deleted

## 2015-01-06 VITALS — Ht 67.0 in | Wt 192.0 lb

## 2015-01-06 DIAGNOSIS — Z1211 Encounter for screening for malignant neoplasm of colon: Secondary | ICD-10-CM

## 2015-01-06 MED ORDER — NA SULFATE-K SULFATE-MG SULF 17.5-3.13-1.6 GM/177ML PO SOLN: 1.0000 | Freq: Once | ORAL | Status: DC

## 2015-01-06 NOTE — Progress Notes (Signed)
Denies allergies to eggs or soy products. Denies complications with sedation or anesthesia. Denies O2 use. Denies use of diet or weight loss medications.  Emmi instructions not given for colonoscopy, patient does not have a computer.

## 2015-01-12 ENCOUNTER — Telehealth: Payer: Self-pay | Admitting: *Deleted

## 2015-01-12 NOTE — Telephone Encounter (Signed)
Received rejection from pharmacy about his Suprep Liquid  Called pt to inform he could get a Suprep sample  But his wife said he paid for it out of pocket which was $90

## 2015-01-20 ENCOUNTER — Other Ambulatory Visit (INDEPENDENT_AMBULATORY_CARE_PROVIDER_SITE_OTHER): Payer: Medicare Other

## 2015-01-20 ENCOUNTER — Other Ambulatory Visit: Payer: Self-pay

## 2015-01-20 ENCOUNTER — Encounter: Payer: Self-pay | Admitting: Gastroenterology

## 2015-01-20 ENCOUNTER — Ambulatory Visit (AMBULATORY_SURGERY_CENTER): Payer: Medicare Other | Admitting: Gastroenterology

## 2015-01-20 VITALS — BP 133/80 | HR 71 | Temp 95.2°F | Resp 17 | Ht 67.0 in | Wt 192.0 lb

## 2015-01-20 DIAGNOSIS — Z1211 Encounter for screening for malignant neoplasm of colon: Secondary | ICD-10-CM

## 2015-01-20 DIAGNOSIS — R1013 Epigastric pain: Secondary | ICD-10-CM

## 2015-01-20 DIAGNOSIS — Z800 Family history of malignant neoplasm of digestive organs: Secondary | ICD-10-CM

## 2015-01-20 DIAGNOSIS — K5730 Diverticulosis of large intestine without perforation or abscess without bleeding: Secondary | ICD-10-CM

## 2015-01-20 LAB — CREATININE, SERUM: Creatinine, Ser: 1.52 mg/dL — ABNORMAL HIGH (ref 0.40–1.50)

## 2015-01-20 LAB — BUN: BUN: 14 mg/dL (ref 6–23)

## 2015-01-20 MED ORDER — SODIUM CHLORIDE 0.9 % IV SOLN: 500.0000 mL | INTRAVENOUS | Status: DC

## 2015-01-20 NOTE — Op Note (Addendum)
[place]  [address]. [place], [address]   COLONOSCOPY PROCEDURE REPORT  PATIENT: Antonio Serrano, Antonio Serrano  MR#: [id number] BIRTHDATE: 1946-09-08 , 56  yrs. old GENDER: male ENDOSCOPIST: Inda Castle, MD REFERRED AJ:[id number] Anitra Lauth, M.D. PROCEDURE DATE:  01/20/2015 PROCEDURE:   Colonoscopy, screening First Screening Colonoscopy - Avg.  risk and is 50 yrs.  old or older - No.  Prior Negative Screening - Now for repeat screening. 10 or more years since last screening  History of Adenoma - Now for follow-up colonoscopy & has been > or = to 3 yrs.  N/A ASA CLASS:   Class II INDICATIONS:FH Colon or Rectal Adenocarcinoma. MEDICATIONS: Monitored anesthesia care, Propofol 200 mg IV, and Glycopyrrolate (Robinul) 2 mg IV  DESCRIPTION OF PROCEDURE:   After the risks benefits and alternatives of the procedure were thoroughly explained, informed consent was obtained.  The digital rectal exam revealed no abnormalities of the rectum.   The LB [id number]  endoscope was introduced through the anus and advanced to the cecum, which was identified by both the appendix and ileocecal valve. No adverse events experienced.   The quality of the prep was (Suprep was used) excellent.  The instrument was then slowly withdrawn as the colon was fully examined.      COLON FINDINGS: There was mild diverticulosis noted in the descending colon.   Internal hemorrhoids were found.   In the area of the appendiceal orifice there was a submucosal mass measuring approximately 2 cm.  Area was soft and overlying mucosa was normal. Retroflexed views revealed no abnormalities. The time to cecum = 5.3 Withdrawal time = 11.5   The scope was withdrawn and the procedure completed. COMPLICATIONS: Th   The patient developed transient bradycardia in the 30s.  Blood pressure was maintained.  He was given  Robinul 2 mg IV with prompt return of his heart rate.  ENDOSCOPIC IMPRESSION: 1.   Mild  diverticulosis was noted in the descending colon 2.   Internal hemorrhoids 3.   I Submucosal mass (prominence) in area of appendiceal orifice  RECOMMENDATIONS: If  1.  Colonoscopy in 5 years because of family history of colon cancer 2.  CT of the abdomen and pelvis  eSigned:  Inda Castle, MD 01/20/2015 1:19 PM Revised: 01/20/2015 1:19 PM  cc:   PATIENT NAME:  Antonio Serrano, Antonio Serrano MR#: [id number]

## 2015-01-20 NOTE — Progress Notes (Signed)
Patient off monitor and ambulated to bathroom. Per wife patient appears back to baseline. Speech is unchanged for him and gait is normal per wife.

## 2015-01-20 NOTE — Progress Notes (Signed)
Patient appropriate, following commands, no appearance of any weakness.

## 2015-01-20 NOTE — Progress Notes (Signed)
1145 O2 SAT's noted to be decreasing on O2 l/[place] , O2 increased to  6l/[place] with chin lift to maximize airway but O2 remains in 80's 1146 Ambu O2 initiated, HR 30's,  Robinol 0.2 mg IV given, O2 sat's 96-98% 1147 HR 80's, O2 SAT's 95-97%, O2 decreased to 4l/[place] 1158 Maintaining O2 SAT's,  Moving eyelids to verbal stimulation 1211 Following simple commands, maintaining O2 SAT's on 4l/[place], verbalizing with poor articulation 1230 Maintaining SAT's >96% on 2l/[place], spontaneously verbalizing with improved articulation 1234 ECG obtained only nominal changes from exam of 05/24/2013 1300 Verbalizing well, maintaining O2 sat's >96 on RA, His wife reports verbalization back to normal baseline 1308 ambulated to bathroom without complication, wife reports ambulating without complication

## 2015-01-20 NOTE — Patient Instructions (Addendum)
Impressions/recommendations:  Diverticulosis (handout given) High Fiber diet (handout given) Hemorrhoids (handout given)  Submucosal Mass in area of appendiceal orifice  CT Scan of Abdomen and Pelvis (handout given)  YOU HAD AN ENDOSCOPIC PROCEDURE TODAY AT [place]:   Refer to the procedure report that was given to you for any specific questions about what was found during the examination.  If the procedure report does not answer your questions, please call your gastroenterologist to clarify.  If you requested that your care partner not be given the details of your procedure findings, then the procedure report has been included in a sealed envelope for you to review at your convenience later.  YOU SHOULD EXPECT: Some feelings of bloating in the abdomen. Passage of more gas than usual.  Walking can help get rid of the air that was put into your GI tract during the procedure and reduce the bloating. If you had a lower endoscopy (such as a colonoscopy or flexible sigmoidoscopy) you may notice spotting of blood in your stool or on the toilet paper. If you underwent a bowel prep for your procedure, you may not have a normal bowel movement for a few days.  Please Note:  You might notice some irritation and congestion in your nose or some drainage.  This is from the oxygen used during your procedure.  There is no need for concern and it should clear up in a day or so.  SYMPTOMS TO REPORT IMMEDIATELY:   Following lower endoscopy (colonoscopy or flexible sigmoidoscopy):  Excessive amounts of blood in the stool  Significant tenderness or worsening of abdominal pains  Swelling of the abdomen that is new, acute  Fever of 100F or higher  For urgent or emergent issues, a gastroenterologist can be reached at any hour by calling [phone].   DIET: Your first meal following the procedure should be a small meal and then it is ok to progress to your normal diet. Heavy or fried  foods are harder to digest and may make you feel nauseous or bloated.  Likewise, meals heavy in dairy and vegetables can increase bloating.  Drink plenty of fluids but you should avoid alcoholic beverages for 24 hours.  ACTIVITY:  You should plan to take it easy for the rest of today and you should NOT DRIVE or use heavy machinery until tomorrow (because of the sedation medicines used during the test).    FOLLOW UP: Our staff will call the number listed on your records the next business day following your procedure to check on you and address any questions or concerns that you may have regarding the information given to you following your procedure. If we do not reach you, we will leave a message.  However, if you are feeling well and you are not experiencing any problems, there is no need to return our call.  We will assume that you have returned to your regular daily activities without incident.  If any biopsies were taken you will be contacted by phone or by letter within the next 1-3 weeks.  Please call us at [phone] if you have not heard about the biopsies in 3 weeks.    SIGNATURES/CONFIDENTIALITY: You and/or your care partner have signed paperwork which will be entered into your electronic medical record.  These signatures attest to the fact that that the information above on your After Visit Summary has been reviewed and is understood.  Full responsibility of the confidentiality of this discharge information lies  with you and/or your care-partner.

## 2015-01-21 ENCOUNTER — Telehealth: Payer: Self-pay | Admitting: *Deleted

## 2015-01-21 NOTE — Telephone Encounter (Signed)
  Follow up Call-  Call back number 01/20/2015  Post procedure Call Back phone  # [phone]  Permission to leave phone message Yes     Patient questions:  Do you have a fever, pain , or abdominal swelling? No. Pain Score  0 *  Have you tolerated food without any problems? Yes.    Have you been able to return to your normal activities? Yes.    Do you have any questions about your discharge instructions: Diet   No. Medications  No. Follow up visit  No.  Do you have questions or concerns about your Care? No.  Actions: * If pain score is 4 or above: No action needed, pain <4. Spoke with pt's wife who said pt did real good when he got home ,did not have any problems .

## 2015-01-25 ENCOUNTER — Ambulatory Visit (INDEPENDENT_AMBULATORY_CARE_PROVIDER_SITE_OTHER)
Admission: RE | Admit: 2015-01-25 | Discharge: 2015-01-25 | Disposition: A | Discharge status: Home / Self Care | Payer: Medicare Other | Source: Ambulatory Visit | Attending: Gastroenterology | Admitting: Gastroenterology

## 2015-01-25 DIAGNOSIS — R1013 Epigastric pain: Secondary | ICD-10-CM | POA: Diagnosis not present

## 2015-01-25 IMAGING — CT CT ABD-PELV W/ CM
2 of 5 series · 16 of 46 positions shown, 18 images · IV contrast (Omnipaque 300)
Comparison: No priors.

CLINICAL DATA: 69-year-old male with recently identified submucosal
mass in the area of the appendiceal orifice noted during routine
colonoscopy.

EXAM:
CT ABDOMEN AND PELVIS WITH CONTRAST
TECHNIQUE: Multidetector CT imaging of the abdomen and pelvis was performed
using the standard protocol following bolus administration of
intravenous contrast.
CONTRAST:  100mL OMNIPAQUE IOHEXOL 300 MG/ML  SOLN

[Series 2: abd/ pel 5mm · axial · 0.78mm/px · z∈[-450,-60]mm · 13 of 88 slices shown, 15 images]
[im 5/88  soft-tissue]
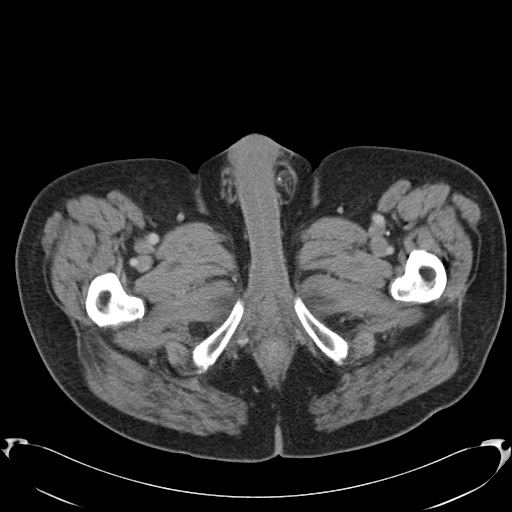
[im 5/88  bone]
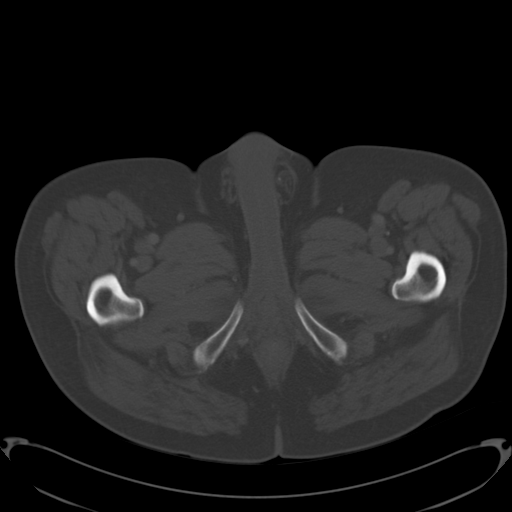
[im 10/88  soft-tissue]
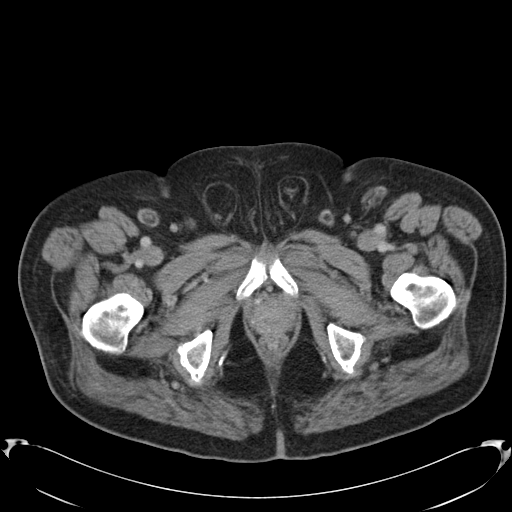
[im 20/88  soft-tissue]
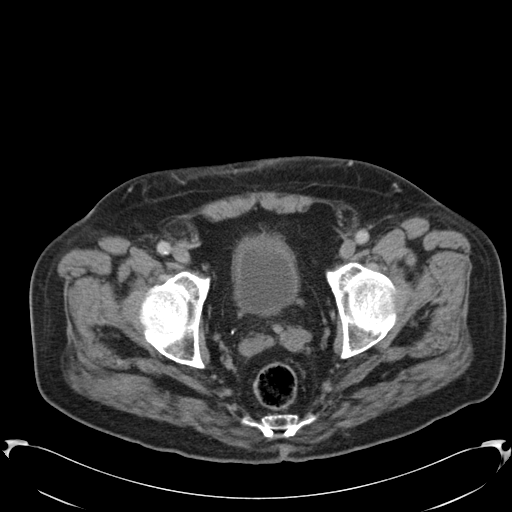
[im 25/88  soft-tissue]
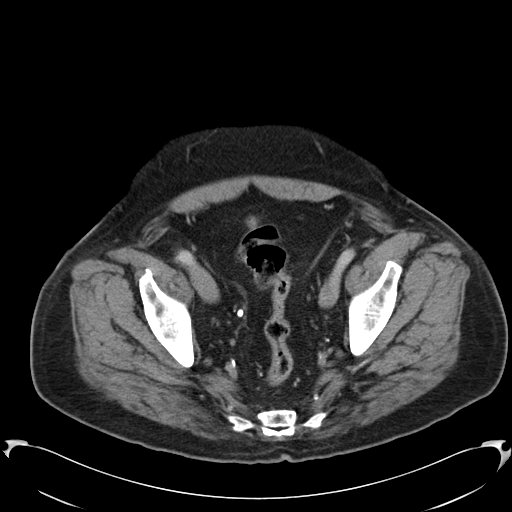
[im 30/88  soft-tissue]
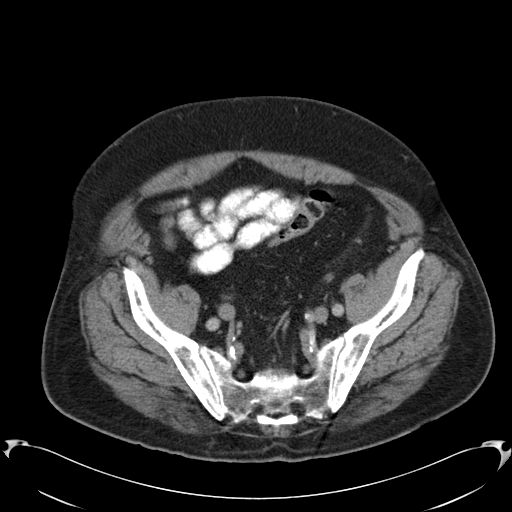
[im 39/88  soft-tissue]
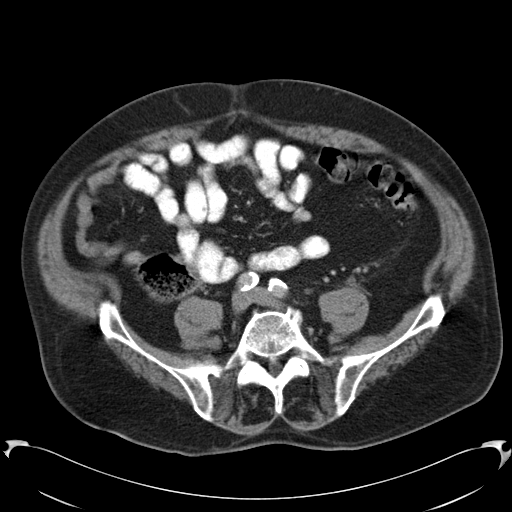
[im 44/88  soft-tissue]
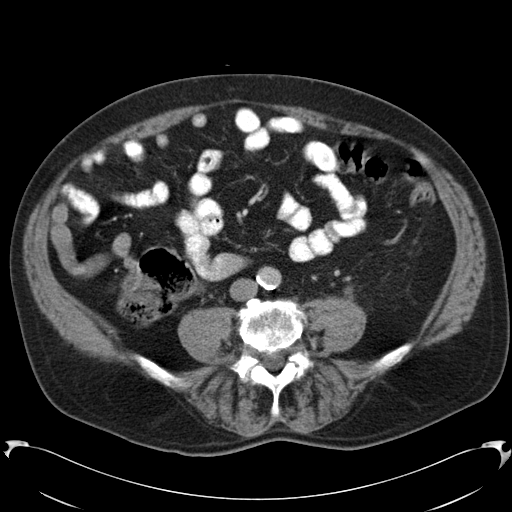
[im 49/88  soft-tissue]
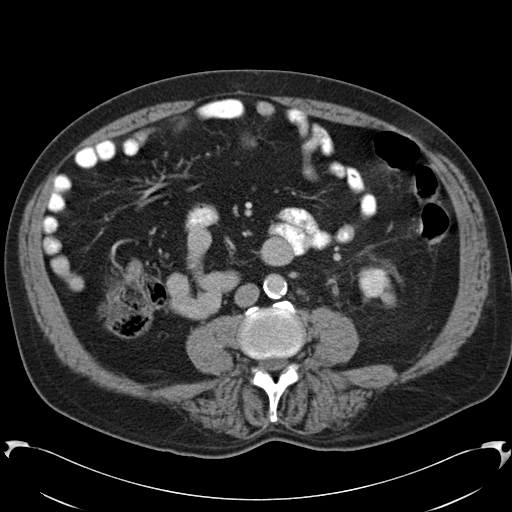
[im 59/88  soft-tissue]
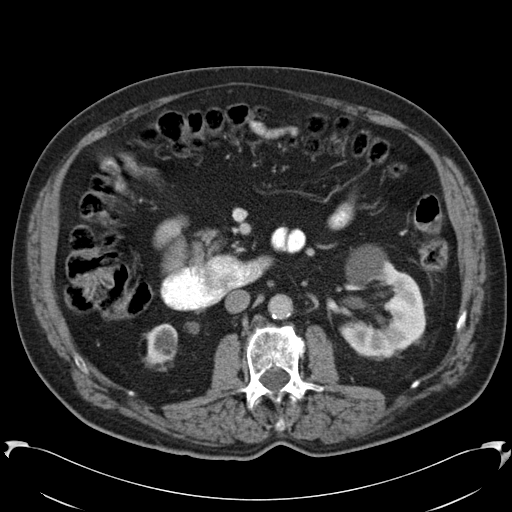
[im 59/88  bone]
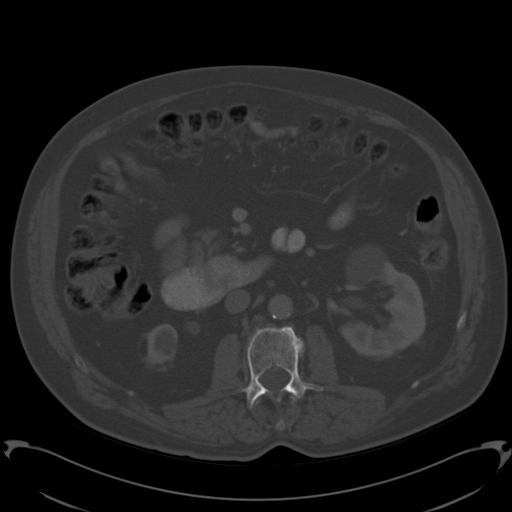
[im 63/88  soft-tissue]
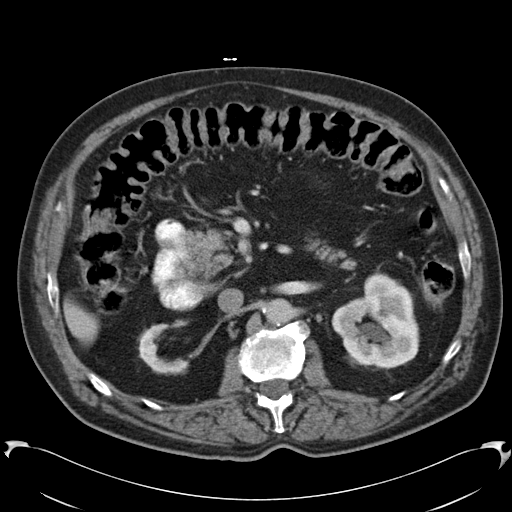
[im 68/88  soft-tissue]
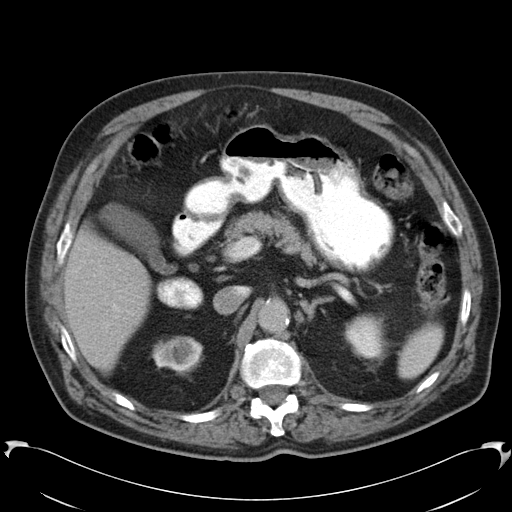
[im 78/88  soft-tissue]
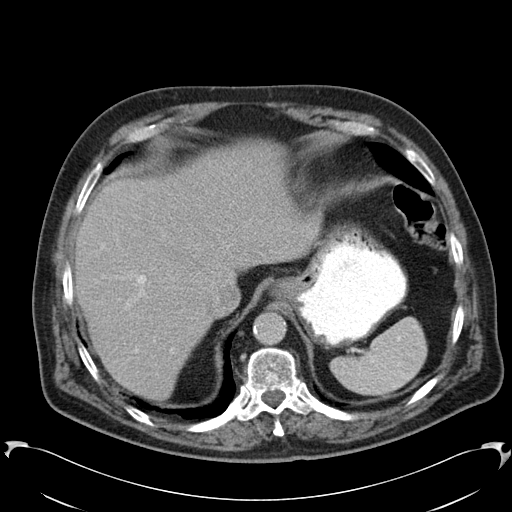
[im 83/88  soft-tissue]
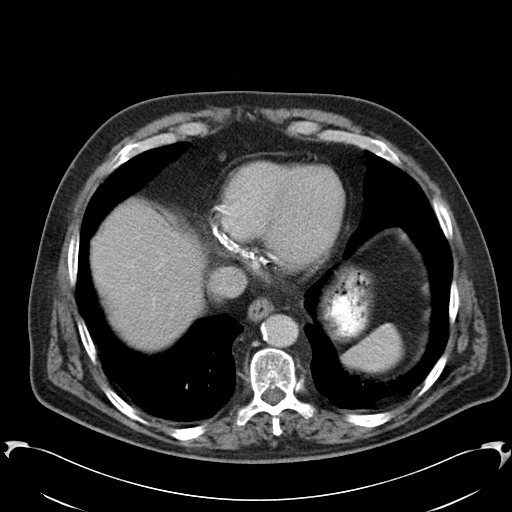

[Series 602: cor · coronal · 0.88mm/px · 3 of 144 slices shown]
[im 48/144  soft-tissue]
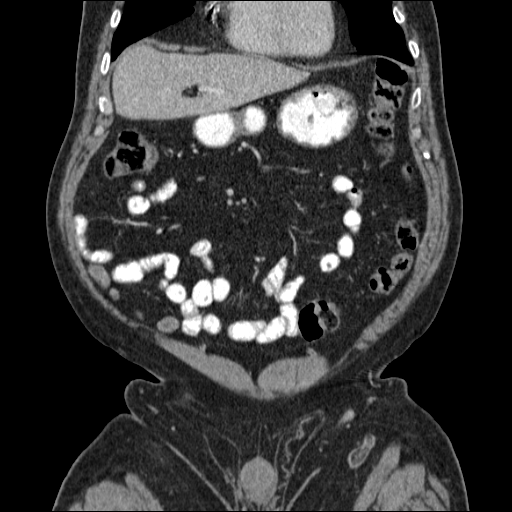
[im 64/144  soft-tissue]
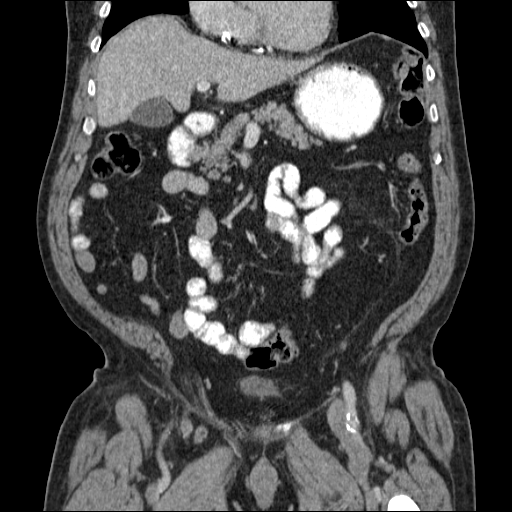
[im 80/144  soft-tissue]
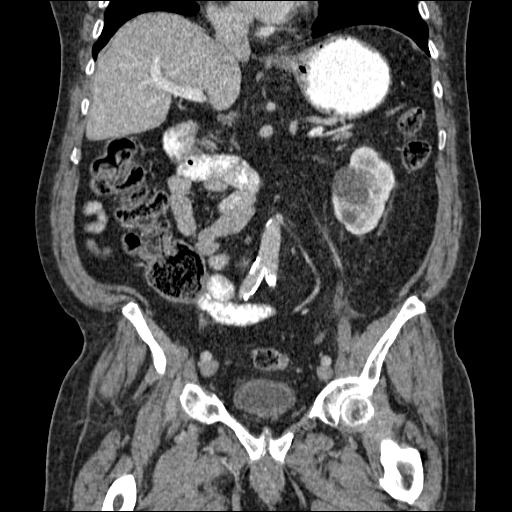

[16 of 46 positions shown; findings below may reference images not displayed]

FINDINGS: Lower chest: Atherosclerotic calcifications in the left anterior
descending and right coronary arteries.

Hepatobiliary: No cystic or solid hepatic lesions. No intra or
extrahepatic biliary ductal dilatation. Gallbladder is normal in
appearance.

Pancreas: Unremarkable.

Spleen: Unremarkable.

Adrenals/Urinary Tract: Bilateral adrenal glands are normal in
appearance. Right kidney is severely atrophic. Left kidney is normal
in size. Multiple low-attenuation lesions in the kidneys
bilaterally, compatible with simple cysts, largest of which measures
4.9 cm in the anterior aspect of the interpolar region of the left
kidney. No hydroureteronephrosis. Urinary bladder is normal in
appearance.

Stomach/Bowel: Normal appearance of the stomach. The second portion
of the duodenum is extremely tortuous, and the third portion of the
duodenum partially crosses the midline, but then subsequently
extends back to the right, compatible with small bowel bowel
rotation. No pathologic dilatation of small bowel or colon. The
cecum is in the right lower quadrant, and there is a normal
appearance of the ascending, transverse, descending and sigmoid
colon. The appendix is normal in appearance. No mural thickening,
nodularity or definite mass identified in the cecum adjacent to the
orifice of the appendix to account for the perceived abnormality on
the recent colonoscopy.

Vascular/Lymphatic: Atherosclerosis throughout the abdominal and
pelvic vasculature, without evidence of aneurysm or dissection.

Reproductive: Prostate gland and seminal vesicles are unremarkable
in appearance.

Other: No significant volume of ascites.  No pneumoperitoneum.

Musculoskeletal: Old fractures of the posterolateral aspect of the
right ninth and tenth ribs. There are no aggressive appearing lytic
or blastic lesions noted in the visualized portions of the skeleton.
IMPRESSION: 1. The appendix is grossly normal, as is the adjacent portion of the
cecum. No findings on today's examination to account for the
perceived submucosal mass on recent colonoscopy.
2. Small bowel malrotation incidentally noted. No associated bowel
obstruction at this time.
3. Severe atrophy of the right kidney. Left kidney is normal in
size. Multiple simple cysts in the kidneys bilaterally.
4. Atherosclerosis, including at least 2 vessel coronary artery
disease. Assessment for potential risk factor modification, dietary
therapy or pharmacologic therapy may be warranted, if clinically
indicated.
5. Additional incidental findings, as above.

## 2015-01-25 MED ORDER — IOHEXOL 300 MG/ML  SOLN
100.0000 mL | Freq: Once | INTRAMUSCULAR | Status: AC | PRN
  Administered 2015-01-25: 100 mL via INTRAVENOUS

## 2015-03-23 ENCOUNTER — Ambulatory Visit: Payer: Self-pay | Admitting: Family Medicine

## 2015-03-31 ENCOUNTER — Ambulatory Visit (INDEPENDENT_AMBULATORY_CARE_PROVIDER_SITE_OTHER): Payer: Medicare Other | Admitting: Family Medicine

## 2015-03-31 ENCOUNTER — Encounter: Payer: Self-pay | Admitting: Family Medicine

## 2015-03-31 ENCOUNTER — Other Ambulatory Visit: Payer: Self-pay | Admitting: *Deleted

## 2015-03-31 VITALS — BP 126/78 | HR 59 | Temp 98.0°F | Resp 12 | Wt 187.8 lb

## 2015-03-31 DIAGNOSIS — N183 Chronic kidney disease, stage 3 unspecified: Secondary | ICD-10-CM

## 2015-03-31 DIAGNOSIS — F17200 Nicotine dependence, unspecified, uncomplicated: Secondary | ICD-10-CM

## 2015-03-31 DIAGNOSIS — E1129 Type 2 diabetes mellitus with other diabetic kidney complication: Secondary | ICD-10-CM

## 2015-03-31 DIAGNOSIS — E1122 Type 2 diabetes mellitus with diabetic chronic kidney disease: Secondary | ICD-10-CM

## 2015-03-31 DIAGNOSIS — N5201 Erectile dysfunction due to arterial insufficiency: Secondary | ICD-10-CM

## 2015-03-31 DIAGNOSIS — Z800 Family history of malignant neoplasm of digestive organs: Secondary | ICD-10-CM

## 2015-03-31 DIAGNOSIS — N189 Chronic kidney disease, unspecified: Secondary | ICD-10-CM | POA: Diagnosis not present

## 2015-03-31 DIAGNOSIS — I10 Essential (primary) hypertension: Secondary | ICD-10-CM | POA: Diagnosis not present

## 2015-03-31 LAB — LIPID PANEL
Cholesterol: 159 mg/dL (ref 0–200)
HDL: 39.7 mg/dL (ref >39.00)
LDL CALC: 102 mg/dL — AB (ref 0–99)
NonHDL: 119.3
TRIGLYCERIDES: 87 mg/dL (ref 0.0–149.0)
Total CHOL/HDL Ratio: 4
VLDL: 17.4 mg/dL (ref 0.0–40.0)

## 2015-03-31 LAB — HEMOGLOBIN A1C: Hgb A1c MFr Bld: 6.5 % (ref 4.6–6.5)

## 2015-03-31 MED ORDER — SILDENAFIL CITRATE 20 MG PO TABS: ORAL_TABLET | ORAL | Status: DC

## 2015-03-31 NOTE — Progress Notes (Signed)
OFFICE NOTE  03/31/2015  CC:  Chief Complaint  Patient presents with  . Follow-up   HPI: Patient is a 69 y.o. Caucasian male who is here for 4 mo f/u HTN, DM 2, CRI stage 3, and tobacco dependence. Saw Dr. Mercy Moore last week: Cr 1.5.  No changes in meds. Home glucose: fasting avg 90s.   2H PP 190s. Tobacco: still smoking 1/4 pack per day.  Not contemplating quitting at this time. Tomorrow is his last day of work, retiring.  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 Got colonoscopy 12/2014 and this was normal: repeat 5 yrs.   Pertinent PMH:  Past medical, surgical, social, and family history reviewed and no changes are noted since last office visit.  MEDS:  Outpatient Prescriptions Prior to Visit  Medication Sig Dispense Refill  . amLODipine (NORVASC) 10 MG tablet Take 1 tablet (10 mg total) by mouth daily. 90 tablet 3  . aspirin EC 81 MG tablet Take 81 mg by mouth daily.    . carvedilol (COREG) 6.25 MG tablet TAKE ONE-HALF TABLET BY MOUTH IN THE MORNING, THEN TAKE ONE IN THE EVENING 45 tablet 11  . furosemide (LASIX) 40 MG tablet Take 1 tablet (40 mg total) by mouth daily. 90 tablet 4  . glipiZIDE (GLUCOTROL XL) 5 MG 24 hr tablet Take 1 tablet (5 mg total) by mouth daily. 90 tablet 3  . losartan (COZAAR) 100 MG tablet Take 1 tablet (100 mg total) by mouth daily. 30 tablet 6  . pioglitazone (ACTOS) 30 MG tablet Take 1 tablet (30 mg total) by mouth daily. 30 tablet 11   No facility-administered medications prior to visit.    PE: Blood pressure 126/78, pulse 59, temperature 98 F (36.7 C), temperature source Oral, resp. rate 12, weight 187 lb 12.8 oz (85.186 kg), SpO2 96 %. Gen: Alert, well appearing.  Patient is oriented to person, place, time, and situation. CV: RRR, no m/r/g.   LUNGS: CTA bilat, nonlabored resps, good aeration in all lung fields. EXT: no clubbing or cyanosis.  Trace to 1+ pitting edema in ankles bilat.  LABS:  Lab Results   Component Value Date   HGBA1C 6.2 03/25/2014    Lab Results  Component Value Date   CREATININE 1.52* 01/20/2015   BUN 14 01/20/2015   NA 128* 04/19/2014   K 4.1 04/19/2014   CL 99 04/19/2014   CO2 25 04/19/2014   Lab Results  Component Value Date   ALT 17 05/29/2012   AST 18 05/29/2012   ALKPHOS 105 05/29/2012   BILITOT 0.3 05/29/2012   Lab Results  Component Value Date   CHOL 149 05/25/2013   Lab Results  Component Value Date   HDL 35.60* 05/25/2013   Lab Results  Component Value Date   LDLCALC 97 05/25/2013   Lab Results  Component Value Date   TRIG 82.0 05/25/2013   Lab Results  Component Value Date   CHOLHDL 4 05/25/2013   Lab Results  Component Value Date   PSA 4.08* 05/25/2013   IMPRESSION AND PLAN:  1) DM 2, fair control per home monitoring. Dr. Mercy Moore apparently did urine microalb per pt: awaiting records from recent o/v to get to me. HbA1c today. FLP today.  2) HTN; The current medical regimen is effective;  continue present plan and medications. Recent lytes/cr at Dr. Etheleen Nicks stable per pt: awaiting records.  3) CRI, stage III: stable. Awaiting nephrologist's most recent o/v note + labs.  4) Tob dependence:  encouraged complete cessation but pt not contemplating this currently.  5) ED: pt requested viagra 20mg  rx printed so I did this today.  6) FH of colon cancer: recent colonoscopy w/out polyps.  Recall 5 yrs per GI.  An After Visit Summary was printed and given to the patient.  FOLLOW UP: 4 mo

## 2015-04-01 MED ORDER — ATORVASTATIN CALCIUM 40 MG PO TABS: 40.0000 mg | ORAL_TABLET | Freq: Every day | ORAL | Status: DC

## 2015-04-01 NOTE — Addendum Note (Signed)
Addended by: Lanae Crumbly on: 04/01/2015 10:43 AM   Modules accepted: Orders

## 2015-04-20 ENCOUNTER — Other Ambulatory Visit: Payer: Self-pay | Admitting: Family Medicine

## 2015-04-20 NOTE — Telephone Encounter (Signed)
RF request for Losartan.  LOV: 03/31/15 Next ov: 07/29/15 Last written: 06/04/14 #30 w/ 6RF

## 2015-06-27 ENCOUNTER — Ambulatory Visit (INDEPENDENT_AMBULATORY_CARE_PROVIDER_SITE_OTHER): Payer: Medicare Other | Admitting: Family Medicine

## 2015-06-27 ENCOUNTER — Encounter: Payer: Self-pay | Admitting: Family Medicine

## 2015-06-27 VITALS — BP 154/84 | HR 63 | Temp 97.6°F | Resp 18 | Ht 65.5 in | Wt 178.0 lb

## 2015-06-27 DIAGNOSIS — Z23 Encounter for immunization: Secondary | ICD-10-CM

## 2015-06-27 DIAGNOSIS — H109 Unspecified conjunctivitis: Secondary | ICD-10-CM

## 2015-06-27 DIAGNOSIS — Z0000 Encounter for general adult medical examination without abnormal findings: Secondary | ICD-10-CM

## 2015-06-27 DIAGNOSIS — I10 Essential (primary) hypertension: Secondary | ICD-10-CM

## 2015-06-27 MED ORDER — POLYMYXIN B-TRIMETHOPRIM 10000-0.1 UNIT/ML-% OP SOLN: OPHTHALMIC | Status: DC

## 2015-06-27 MED ORDER — OLOPATADINE HCL 0.2 % OP SOLN: OPHTHALMIC | Status: DC

## 2015-06-27 NOTE — Progress Notes (Signed)
Pre visit review using our clinic review tool, if applicable. No additional management support is needed unless otherwise documented below in the visit note. 

## 2015-06-27 NOTE — Patient Instructions (Signed)
Allergic Conjunctivitis  The conjunctiva is a thin membrane that covers the visible white part of the eyeball and the underside of the eyelids. This membrane protects and lubricates the eye. The membrane has small blood vessels running through it that can normally be seen. When the conjunctiva becomes inflamed, the condition is called conjunctivitis. In response to the inflammation, the conjunctival blood vessels become swollen. The swelling results in redness in the normally white part of the eye.  The blood vessels of this membrane also react when a person has allergies and is then called allergic conjunctivitis. This condition usually lasts for as long as the allergy persists. Allergic conjunctivitis cannot be passed to another person (non-contagious). The likelihood of bacterial infection is great and the cause is not likely due to allergies if the inflamed eye has:  · A sticky discharge.  · Discharge or sticking together of the lids in the morning.  · Scaling or flaking of the eyelids where the eyelashes come out.  · Red swollen eyelids.  CAUSES   · Viruses.  · Irritants such as foreign bodies.  · Chemicals.  · General allergic reactions.  · Inflammation or serious diseases in the inside or the outside of the eye or the orbit (the boney cavity in which the eye sits) can cause a "red eye."  SYMPTOMS   · Eye redness.  · Tearing.  · Itchy eyes.  · Burning feeling in the eyes.  · Clear drainage from the eye.  · Allergic reaction due to pollens or ragweed sensitivity. Seasonal allergic conjunctivitis is frequent in the spring when pollens are in the air and in the fall.  DIAGNOSIS   This condition, in its many forms, is usually diagnosed based on the history and an ophthalmological exam. It usually involves both eyes. If your eyes react at the same time every year, allergies may be the cause. While most "red eyes" are due to allergy or an infection, the role of an eye (ophthalmological) exam is important. The exam  can rule out serious diseases of the eye or orbit.  TREATMENT   · Non-antibiotic eye drops, ointments, or medications by mouth may be prescribed if the ophthalmologist is sure the conjunctivitis is due to allergies alone.  · Over-the-counter drops and ointments for allergic symptoms should be used only after other causes of conjunctivitis have been ruled out, or as your caregiver suggests.  Medications by mouth are often prescribed if other allergy-related symptoms are present. If the ophthalmologist is sure that the conjunctivitis is due to allergies alone, treatment is normally limited to drops or ointments to reduce itching and burning.  HOME CARE INSTRUCTIONS   · Wash hands before and after applying drops or ointments, or touching the inflamed eye(s) or eyelids.  · Do not let the eye dropper tip or ointment tube touch the eyelid when putting medicine in your eye.  · Stop using your soft contact lenses and throw them away. Use a new pair of lenses when recovery is complete. You should run through sterilizing cycles at least three times before use after complete recovery if the old soft contact lenses are to be used. Hard contact lenses should be stopped. They need to be thoroughly sterilized before use after recovery.  · Itching and burning eyes due to allergies is often relieved by using a cool cloth applied to closed eye(s).  SEEK MEDICAL CARE IF:   · Your problems do not go away after two or three days of treatment.  ·   Your lids are sticky (especially in the morning when you wake up) or stick together.  · Discharge develops. Antibiotics may be needed either as drops, ointment, or by mouth.  · You have extreme light sensitivity.  · An oral temperature above 102° F (38.9° C) develops.  · Pain in or around the eye or any other visual symptom develops.  MAKE SURE YOU:   · Understand these instructions.  · Will watch your condition.  · Will get help right away if you are not doing well or get worse.  Document  Released: 12/08/2002 Document Revised: 12/10/2011 Document Reviewed: 11/03/2007  ExitCare® Patient Information ©2015 [employer]. This information is not intended to replace advice given to you by your health care provider. Make sure you discuss any questions you have with your health care provider.

## 2015-06-27 NOTE — Progress Notes (Signed)
   Subjective:    Patient ID: Antonio Serrano, male    DOB: July 31, 1946, 69 y.o.   MRN: [id number]  HPI  Conjunctivitis: She presents for an acute office visit today with a four-day history of bilateral conjunctivitis. He endorses itching of the bilateral eyes and symptoms are worse in the morning. He does endorse crusty/mildly matted eyes in the morning. He does not take an anti-histamine daily. Denies fevers, chills, nausea, vomiting, diarrhea, headaches, visual change, eye pain, photophobia or exposure to sick contacts. Patient is allergic to Bactrim, nausea only when taken orally.  Every day smoker Past Medical History  Diagnosis Date  . HTN (hypertension)     Renal/aortic doppler u/s normal 06/2010  . Diabetes mellitus Dx'd fall 2011    type 2  . Tobacco dependence   . Nonspecific abnormal electrocardiogram (ECG) (EKG) Fall 2011    TWI in inferior leads: myocardial perfusion scan neg and echo normal 07/2010  . Chronic renal insufficiency, stage III (moderate)     baseline Cr 2 (GFR mid 30s) as of 11/2014  . Hypercalcemia   . UTI (lower urinary tract infection)     Feb 2012 (klebsiella--dx at [facility]); 03/2013 e coli.   . Chronic prostatitis     Very mild elevation of PSA (>4), referred to [facility] where PSA repeat was 1.0 on 06/16/13.  Annual PSA repeat is all that is needed now per urologist.  . Hyperlipidemia 2016   Allergies  Allergen Reactions  . Bactrim [Sulfamethoxazole-Trimethoprim] Nausea Only     Review of Systems Negative, with the exception of above mentioned in HPI     Objective:   Physical Exam BP 154/84 mmHg  Pulse 63  Temp(Src) 97.6 F (36.4 C) (Temporal)  Resp 18  Ht 5' 5.5" (1.664 m)  Wt 178 lb (80.74 kg)  BMI 29.16 kg/m2  SpO2 98% Gen: Afebrile. No acute distress. Nontoxic in appearance, well-developed, well-nourished male. HENT: AT. [place]. Bilateral TM visualized and normal in appearance. MMM. Bilateral nares pale, no erythema or swelling. Throat without  erythema or exudates. No tenderness to palpation over bilateral sinuses. No cough or hoarseness on exam. Eyes:Pupils Equal Round Reactive to light, Extraocular movements intact,  bilateral Conjunctiva with redness, no drainage or icterus noted. Neck/lymp/endocrine: Supple, no lymphadenopathy     Assessment & Plan:  1. Bilateral conjunctivitis - Allergic versus viral etiology. Will treat prophylactically was Polytrim eyedrops for 5-7 days, and Pataday for allergic symptoms. - Olopatadine HCl 0.2 % SOLN; 1 drop each eye daily 2 weeks, or until condition resloves  Dispense: 1 Bottle; Refill: 0 - trimethoprim-polymyxin b (POLYTRIM) ophthalmic solution; 2 drops each eye for 6 days.  Dispense: 10 mL; Refill: 0  2. Healthcare maintenance - Flu Vaccine QUAD 36+ mos IM   3. Elevated blood pressure, on hypertensive meds: She had mildly elevated blood pressure today, advised him to continue taking his blood pressure 3 times a week at home if consistently elevated above 140/90 he is to make an appointment to be seen with his PCP.  Follow-up as needed

## 2015-07-01 ENCOUNTER — Telehealth: Payer: Self-pay | Admitting: *Deleted

## 2015-07-01 MED ORDER — AMOXICILLIN-POT CLAVULANATE 500-125 MG PO TABS: ORAL_TABLET | ORAL | Status: DC

## 2015-07-01 NOTE — Telephone Encounter (Signed)
Pt LMOM on 07/01/15 at 9:08am requesting a call back.  Spoke to pt and he stated that he broke a tooth (bottom left side) yesterday (06/30/15). He stated that he now has an abscess and would like to get an antibiotic. He stated that he is not currently set up with a dentist but is trying to find one. Please advise. Thanks. Pharm: Pete Glatter.

## 2015-07-01 NOTE — Telephone Encounter (Signed)
OK, augmentin eRx'd. Definitely needs to get in with a dentist for definitive treatment of this problem.-thx

## 2015-07-01 NOTE — Telephone Encounter (Signed)
Pt advised and voiced understanding.   

## 2015-07-29 ENCOUNTER — Ambulatory Visit (INDEPENDENT_AMBULATORY_CARE_PROVIDER_SITE_OTHER): Payer: Medicare Other | Admitting: Family Medicine

## 2015-07-29 ENCOUNTER — Encounter: Payer: Self-pay | Admitting: Family Medicine

## 2015-07-29 VITALS — BP 121/74 | HR 59 | Temp 97.9°F | Resp 16 | Ht 65.5 in | Wt 178.0 lb

## 2015-07-29 DIAGNOSIS — N183 Chronic kidney disease, stage 3 (moderate): Secondary | ICD-10-CM | POA: Diagnosis not present

## 2015-07-29 DIAGNOSIS — E1122 Type 2 diabetes mellitus with diabetic chronic kidney disease: Secondary | ICD-10-CM

## 2015-07-29 DIAGNOSIS — I10 Essential (primary) hypertension: Secondary | ICD-10-CM

## 2015-07-29 DIAGNOSIS — Z23 Encounter for immunization: Secondary | ICD-10-CM

## 2015-07-29 DIAGNOSIS — R972 Elevated prostate specific antigen [PSA]: Secondary | ICD-10-CM | POA: Diagnosis not present

## 2015-07-29 DIAGNOSIS — E785 Hyperlipidemia, unspecified: Secondary | ICD-10-CM | POA: Diagnosis not present

## 2015-07-29 LAB — MICROALBUMIN / CREATININE URINE RATIO
Creatinine,U: 60.7 mg/dL
Microalb Creat Ratio: 1.2 mg/g (ref 0.0–30.0)
Microalb, Ur: <0.7 mg/dL (ref 0.0–1.9)

## 2015-07-29 LAB — HEMOGLOBIN A1C: Hgb A1c MFr Bld: 6.8 % — ABNORMAL HIGH (ref 4.6–6.5)

## 2015-07-29 LAB — PSA: PSA: 0.45 ng/mL (ref 0.10–4.00)

## 2015-07-29 MED ORDER — PRAVASTATIN SODIUM 40 MG PO TABS: 40.0000 mg | ORAL_TABLET | Freq: Every day | ORAL | Status: DC

## 2015-07-29 NOTE — Addendum Note (Signed)
Addended by: Lanae Crumbly on: 07/29/2015 01:52 PM   Modules accepted: Orders

## 2015-07-29 NOTE — Progress Notes (Signed)
OFFICE VISIT  07/29/2015   CC:  Chief Complaint  Patient presents with  . Follow-up    Pt is not fasting.    HPI:    Patient is a 69 y.o. Caucasian male who presents for 4 mo f/u HTN, DM 2, HLD. He has CRI stage III managed by Dr. Mercy Moore with nephrology, most recent visit with him was 1 week ago.  Cr was up from 1.5 to 1.65, all else stable.    After last visit I started him on atorva 40mg  qd: he took the med for 5d and stopped it due to muscle pain in calves.  He then stopped the med and legs got better, restarted med sx's came back so he d/c'd it for good.   Has made some efforts to improve diet since he was last here, lost 9 lbs. Continues to cut back on smoking: now at 1/2 pack/day.  Glucoses: fastings 100 or less.  2H PP 140 or less.  No hypoglycemia.  Hx of elevated PSA from prostatitis: due for annual PSA recheck.  He declined a rectal exam today.  Past Medical History  Diagnosis Date  . HTN (hypertension)     Renal/aortic doppler u/s normal 06/2010  . Diabetes mellitus Dx'd fall 2011    type 2  . Tobacco dependence   . Nonspecific abnormal electrocardiogram (ECG) (EKG) Fall 2011    TWI in inferior leads: myocardial perfusion scan neg and echo normal 07/2010  . Chronic renal insufficiency, stage III (moderate)     baseline Cr 2 (GFR mid 30s) as of 11/2014  . Hypercalcemia   . UTI (lower urinary tract infection)     Feb 2012 (klebsiella--dx at [facility]); 03/2013 e coli.   . Chronic prostatitis     Very mild elevation of PSA (>4), referred to [facility] where PSA repeat was 1.0 on 06/16/13.  Annual PSA repeat is all that is needed now per urologist.  . Hyperlipidemia 2016    Past Surgical History  Procedure Laterality Date  . Colonoscopy  12/20/04;12/2014    2016 no polyps: recall 5 yrs due to [place] of colon ca  . Cataract extraction  2007, 2008    Bilateral ([facility] on [address].  . Esophagogastroduodenoscopy  11/2004    esoph stricture/dilation  .  Cardiovascular stress test  07/2010    Myocardial perfusion scan neg/low risk.  . Transthoracic echocardiogram  07/2010    EF =>55%; LA mildly dilated; trace MR/TR;     Outpatient Prescriptions Prior to Visit  Medication Sig Dispense Refill  . amLODipine (NORVASC) 10 MG tablet Take 1 tablet (10 mg total) by mouth daily. 90 tablet 3  . aspirin EC 81 MG tablet Take 81 mg by mouth daily.    . carvedilol (COREG) 6.25 MG tablet TAKE ONE-HALF TABLET BY MOUTH IN THE MORNING, THEN TAKE ONE IN THE EVENING 45 tablet 11  . furosemide (LASIX) 40 MG tablet Take 1 tablet (40 mg total) by mouth daily. 90 tablet 4  . glipiZIDE (GLUCOTROL XL) 5 MG 24 hr tablet Take 1 tablet (5 mg total) by mouth daily. 90 tablet 3  . losartan (COZAAR) 100 MG tablet TAKE ONE TABLET BY MOUTH ONCE DAILY 30 tablet 6  . pioglitazone (ACTOS) 30 MG tablet Take 1 tablet (30 mg total) by mouth daily. 30 tablet 11  . sildenafil (REVATIO) 20 MG tablet 5 tabs po qd prn 50 tablet 6  . atorvastatin (LIPITOR) 40 MG tablet Take 1 tablet (40 mg total)  by mouth daily. 90 tablet 1  . amoxicillin-clavulanate (AUGMENTIN) 500-125 MG tablet 1 tab po bid x 10d (Patient not taking: Reported on 07/29/2015) 20 tablet 0  . Olopatadine HCl 0.2 % SOLN 1 drop each eye daily 2 weeks, or until condition resloves (Patient not taking: Reported on 07/29/2015) 1 Bottle 0  . trimethoprim-polymyxin b (POLYTRIM) ophthalmic solution 2 drops each eye for 6 days. (Patient not taking: Reported on 07/29/2015) 10 mL 0   No facility-administered medications prior to visit.    Allergies  Allergen Reactions  . Bactrim [Sulfamethoxazole-Trimethoprim] Nausea Only    ROS As per HPI  PE: Blood pressure 121/74, pulse 59, temperature 97.9 F (36.6 C), temperature source Oral, resp. rate 16, height 5' 5.5" (1.664 m), weight 178 lb (80.74 kg), SpO2 97 %. Gen: Alert, well appearing.  Patient is oriented to person, place, time, and situation. No further exam  today.  LABS:   Lab Results  Component Value Date   CREATININE 1.52* 01/20/2015   BUN 14 01/20/2015   NA 128* 04/19/2014   K 4.1 04/19/2014   CL 99 04/19/2014   CO2 25 04/19/2014   Lab Results  Component Value Date   ALT 17 05/29/2012   AST 18 05/29/2012   ALKPHOS 105 05/29/2012   BILITOT 0.3 05/29/2012   Lab Results  Component Value Date   CHOL 159 03/31/2015   Lab Results  Component Value Date   HDL 39.70 03/31/2015   Lab Results  Component Value Date   LDLCALC 102* 03/31/2015   Lab Results  Component Value Date   TRIG 87.0 03/31/2015   Lab Results  Component Value Date   CHOLHDL 4 03/31/2015   Lab Results  Component Value Date   PSA 4.08* 05/25/2013   Lab Results  Component Value Date   HGBA1C 6.5 03/31/2015     IMPRESSION AND PLAN:  1) DM 2, with CRI stage 3.  Well controlled. HbA1c today and urine microalb/cr today. Pneumovax 23 today.  2) HTN;The current medical regimen is effective;  continue present plan and medications. Will review labs from his nephrologist's latest o/v when this note is sent to me.  3) HLD: myalgias with atorv.  Will do trial of pravastatin 40mg  qd. Recheck FLP next f/u 4 mo if pt tolerates med.  4) Hx of elevated PSA/chronic prostatitis: per urologist, needs annual PSA (until age 48): he declines DRE. PSA drawn today.  An After Visit Summary was printed and given to the patient.   FOLLOW UP: Return in about 4 months (around 11/29/2015) for routine chronic illness f/u.

## 2015-07-29 NOTE — Progress Notes (Signed)
Pre visit review using our clinic review tool, if applicable. No additional management support is needed unless otherwise documented below in the visit note. 

## 2015-07-31 ENCOUNTER — Encounter: Payer: Self-pay | Admitting: Family Medicine

## 2015-10-06 ENCOUNTER — Other Ambulatory Visit: Payer: Self-pay | Admitting: Family Medicine

## 2015-10-06 NOTE — Telephone Encounter (Signed)
RF request for furosemide LOV: 07/29/15 Next ov: 11/28/15 Last written: 07/16/14 #90 w/ 4RF

## 2015-10-26 ENCOUNTER — Other Ambulatory Visit: Payer: Medicare Other

## 2015-11-07 ENCOUNTER — Other Ambulatory Visit: Payer: Self-pay | Admitting: Family Medicine

## 2015-11-07 NOTE — Telephone Encounter (Signed)
LOV: 07/29/15 NOV: 11/28/15  RF request for pioglitazone Last written: 11/18/14 #30 w/ 11RF  RF request for glipizide Last written: 11/15/14 #90 w/ 3RF

## 2015-11-20 ENCOUNTER — Other Ambulatory Visit: Payer: Self-pay | Admitting: Family Medicine

## 2015-11-21 NOTE — Telephone Encounter (Signed)
LOV: 07/29/15 NOV: 11/28/15  RF request for losartan Last written: 04/20/15 #30 w/ 6RF  RF request for amlodipine Last written: 11/15/14 #90 w/ 3RF  RF request for carvedilol Last written: 11/18/14 #45 w/ 11RF

## 2015-11-28 ENCOUNTER — Ambulatory Visit (INDEPENDENT_AMBULATORY_CARE_PROVIDER_SITE_OTHER): Payer: Medicare Other | Admitting: Family Medicine

## 2015-11-28 ENCOUNTER — Encounter: Payer: Self-pay | Admitting: Family Medicine

## 2015-11-28 VITALS — BP 129/77 | HR 53 | Temp 97.8°F | Resp 16 | Ht 65.5 in | Wt 180.5 lb

## 2015-11-28 DIAGNOSIS — N183 Chronic kidney disease, stage 3 unspecified: Secondary | ICD-10-CM

## 2015-11-28 DIAGNOSIS — I10 Essential (primary) hypertension: Secondary | ICD-10-CM

## 2015-11-28 DIAGNOSIS — E785 Hyperlipidemia, unspecified: Secondary | ICD-10-CM | POA: Diagnosis not present

## 2015-11-28 DIAGNOSIS — E1122 Type 2 diabetes mellitus with diabetic chronic kidney disease: Secondary | ICD-10-CM

## 2015-11-28 DIAGNOSIS — K047 Periapical abscess without sinus: Secondary | ICD-10-CM

## 2015-11-28 LAB — COMPREHENSIVE METABOLIC PANEL WITH GFR
ALT: 11 U/L (ref 0–53)
AST: 21 U/L (ref 0–37)
Albumin: 4.4 g/dL (ref 3.5–5.2)
Alkaline Phosphatase: 94 U/L (ref 39–117)
BUN: 16 mg/dL (ref 6–23)
CO2: 26 meq/L (ref 19–32)
Calcium: 9.4 mg/dL (ref 8.4–10.5)
Chloride: 98 meq/L (ref 96–112)
Creatinine, Ser: 1.6 mg/dL — ABNORMAL HIGH (ref 0.40–1.50)
GFR: 45.9 mL/min — ABNORMAL LOW (ref >60.00)
Glucose, Bld: 115 mg/dL — ABNORMAL HIGH (ref 70–99)
Potassium: 4.4 meq/L (ref 3.5–5.1)
Sodium: 131 meq/L — ABNORMAL LOW (ref 135–145)
Total Bilirubin: 0.6 mg/dL (ref 0.2–1.2)
Total Protein: 7.4 g/dL (ref 6.0–8.3)

## 2015-11-28 LAB — CBC WITH DIFFERENTIAL/PLATELET
BASOS PCT: 0.5 % (ref 0.0–3.0)
Basophils Absolute: 0 10*3/uL (ref 0.0–0.1)
Eosinophils Absolute: 0.2 10*3/uL (ref 0.0–0.7)
Eosinophils Relative: 3.4 % (ref 0.0–5.0)
HEMATOCRIT: 43.1 % (ref 39.0–52.0)
HEMOGLOBIN: 14.6 g/dL (ref 13.0–17.0)
LYMPHS PCT: 28.8 % (ref 12.0–46.0)
Lymphs Abs: 1.9 10*3/uL (ref 0.7–4.0)
MCHC: 33.8 g/dL (ref 30.0–36.0)
MCV: 90 fl (ref 78.0–100.0)
MONO ABS: 0.4 10*3/uL (ref 0.1–1.0)
MONOS PCT: 6.9 % (ref 3.0–12.0)
Neutro Abs: 3.9 10*3/uL (ref 1.4–7.7)
Neutrophils Relative %: 60.4 % (ref 43.0–77.0)
Platelets: 280 10*3/uL (ref 150.0–400.0)
RBC: 4.79 Mil/uL (ref 4.22–5.81)
RDW: 15.1 % (ref 11.5–15.5)
WBC: 6.4 10*3/uL (ref 4.0–10.5)

## 2015-11-28 LAB — HEMOGLOBIN A1C: HEMOGLOBIN A1C: 6.4 % (ref 4.6–6.5)

## 2015-11-28 MED ORDER — ROSUVASTATIN CALCIUM 10 MG PO TABS: 10.0000 mg | ORAL_TABLET | Freq: Every day | ORAL | Status: DC

## 2015-11-28 MED ORDER — AMOXICILLIN-POT CLAVULANATE 500-125 MG PO TABS: ORAL_TABLET | ORAL | Status: DC

## 2015-11-28 NOTE — Progress Notes (Signed)
Pre visit review using our clinic review tool, if applicable. No additional management support is needed unless otherwise documented below in the visit note. 

## 2015-11-28 NOTE — Progress Notes (Signed)
OFFICE VISIT  11/28/2015   CC:  Chief Complaint  Patient presents with  . Follow-up    Pt is not fasting.    HPI:    Patient is a 70 y.o. Caucasian male who presents for 4 mo f/u DM 2, HTN, HLD. Intolerant of atorv so we did trial of pravastatin last visit.  Says this made his legs ache so he stopped it. He last saw Dr. Mercy Moore 07/2015 and he was told to return in 1 yr b/c kidney function had been stable.  Checks gluc about 3 times per week: most times it is around 90-100.  Occ 2H PP check is around 140. Eats whatever he wants/not diabetic diet.  Exercise: plays golf, a few exercises at home.  He wants phone # for dentist b/c he has some bad teeth and currently feels like one on bottom left has infection/abscess.  NO fevers.    BP monitoring: 120-130/70's, HR 60s.  Past Medical History  Diagnosis Date  . HTN (hypertension)     Renal/aortic doppler u/s normal 06/2010  . Diabetes mellitus Dx'd fall 2011    type 2  . Tobacco dependence   . Nonspecific abnormal electrocardiogram (ECG) (EKG) Fall 2011    TWI in inferior leads: myocardial perfusion scan neg and echo normal 07/2010  . Chronic renal insufficiency, stage III (moderate)     baseline Cr 2 (GFR mid 30s) as of 11/2014  . Hypercalcemia   . UTI (lower urinary tract infection)     Feb 2012 (klebsiella--dx at [facility]); 03/2013 e coli.   . Chronic prostatitis     Very mild elevation of PSA (>4), referred to [facility] where PSA repeat was 1.0 on 06/16/13.  Annual PSA repeat is all that is needed now per urologist.  Most recent 07/2015 was 0.45.  Marland Kitchen Hyperlipidemia 2016    Past Surgical History  Procedure Laterality Date  . Colonoscopy  12/20/04;12/2014    2016 no polyps: recall 5 yrs due to [place] of colon ca  . Cataract extraction  2007, 2008    Bilateral ([facility] on [address].  . Esophagogastroduodenoscopy  11/2004    esoph stricture/dilation  . Cardiovascular stress test  07/2010    Myocardial perfusion scan  neg/low risk.  . Transthoracic echocardiogram  07/2010    EF =>55%; LA mildly dilated; trace MR/TR;     Outpatient Prescriptions Prior to Visit  Medication Sig Dispense Refill  . amLODipine (NORVASC) 10 MG tablet TAKE ONE TABLET BY MOUTH ONCE DAILY 90 tablet 3  . aspirin EC 81 MG tablet Take 81 mg by mouth daily.    . carvedilol (COREG) 6.25 MG tablet TAKE ONE-HALF TABLET BY MOUTH IN THE MORNING AND ONE TABLET BY MOUTH IN THE EVENING. 45 tablet 11  . furosemide (LASIX) 40 MG tablet TAKE ONE TABLET BY MOUTH ONCE DAILY 90 tablet 1  . glipiZIDE (GLUCOTROL XL) 5 MG 24 hr tablet TAKE ONE TABLET BY MOUTH ONCE DAILY 90 tablet 1  . losartan (COZAAR) 100 MG tablet TAKE ONE TABLET BY MOUTH ONCE DAILY 30 tablet 11  . pioglitazone (ACTOS) 30 MG tablet TAKE ONE TABLET BY MOUTH ONCE DAILY 30 tablet 6  . sildenafil (REVATIO) 20 MG tablet 5 tabs po qd prn 50 tablet 6  . pravastatin (PRAVACHOL) 40 MG tablet Take 1 tablet (40 mg total) by mouth daily. 30 tablet 3   No facility-administered medications prior to visit.    Allergies  Allergen Reactions  . Bactrim [Sulfamethoxazole-Trimethoprim] Nausea Only  ROS As per HPI  PE: Blood pressure 129/77, pulse 53, temperature 97.8 F (36.6 C), temperature source Oral, resp. rate 16, height 5' 5.5" (1.664 m), weight 180 lb 8 oz (81.874 kg), SpO2 98 %. Gen: Alert, well appearing.  Patient is oriented to person, place, time, and situation. Mouth: poor dentition.  Left lower molar with mild tenderness but no swelling or erythema. CV: RRR, no m/r/g.   LUNGS: CTA bilat, nonlabored resps, good aeration in all lung fields. EXT: no clubbing, cyanosis, or edema.    LABS:   Lab Results  Component Value Date   CREATININE 1.52* 01/20/2015   BUN 14 01/20/2015   NA 128* 04/19/2014   K 4.1 04/19/2014   CL 99 04/19/2014   CO2 25 04/19/2014   Lab Results  Component Value Date   ALT 17 05/29/2012   AST 18 05/29/2012   ALKPHOS 105 05/29/2012   BILITOT 0.3  05/29/2012   Lab Results  Component Value Date   CHOL 159 03/31/2015   Lab Results  Component Value Date   HDL 39.70 03/31/2015   Lab Results  Component Value Date   LDLCALC 102* 03/31/2015   Lab Results  Component Value Date   TRIG 87.0 03/31/2015   Lab Results  Component Value Date   CHOLHDL 4 03/31/2015   Lab Results  Component Value Date   PSA 0.45 07/29/2015   PSA 4.08* 05/25/2013   Lab Results  Component Value Date   HGBA1C 6.8* 07/29/2015     IMPRESSION AND PLAN:  1) Tooth pain, ? Early abscess. Augmentin 500 bid x 10d, gave pt phone # for [facility] dentistry for further evaluation.  2) DM 2: The current medical regimen is effective;  continue present plan and medications. HbA1c today.  3) HTN; The current medical regimen is effective;  continue present plan and medications. Lytes/cr today.  4) CRI stage III: CBC and CMET today. He is to f/u with nephrologist, Dr. Mercy Moore, again in 07/2016.  5) Hyperlipidemia: intolerant of atorv and prava.  Will try one more statin: crestor generic 10mg  qd rx'd today.  An After Visit Summary was printed and given to the patient.  FOLLOW UP: Return in about 4 months (around 03/27/2016) for routine chronic illness f/u (30 min).

## 2015-12-28 ENCOUNTER — Encounter: Payer: Self-pay | Admitting: Family Medicine

## 2015-12-28 ENCOUNTER — Ambulatory Visit (INDEPENDENT_AMBULATORY_CARE_PROVIDER_SITE_OTHER): Payer: Medicare Other | Admitting: Family Medicine

## 2015-12-28 VITALS — BP 126/73 | HR 74 | Temp 98.6°F | Resp 16 | Ht 65.5 in | Wt 181.2 lb

## 2015-12-28 DIAGNOSIS — R319 Hematuria, unspecified: Secondary | ICD-10-CM | POA: Diagnosis not present

## 2015-12-28 DIAGNOSIS — N390 Urinary tract infection, site not specified: Secondary | ICD-10-CM | POA: Diagnosis not present

## 2015-12-28 LAB — POC URINALSYSI DIPSTICK (AUTOMATED)
Ketones, UA: NEGATIVE
NITRITE UA: NEGATIVE
SPEC GRAV UA: 1.02
UROBILINOGEN UA: 0.2
pH, UA: 5.5

## 2015-12-28 MED ORDER — CIPROFLOXACIN HCL 250 MG PO TABS: 250.0000 mg | ORAL_TABLET | Freq: Two times a day (BID) | ORAL | Status: DC

## 2015-12-28 NOTE — Progress Notes (Signed)
OFFICE VISIT  12/28/2015   CC:  Chief Complaint  Patient presents with  . Urinary Tract Infection    burning when urinating x 1 day   HPI:    Patient is a 70 y.o. Caucasian male who presents for about 1 day of urinary frequency, urgency, and dysuria. The dysuria is in the prostate area.  Preceding this, he had a bout of diarrhea illness for which he took anti-diarrheal med.  He then did not have a bm for 3-4 days and then his urinary sx's started.  No pain in testicles. No blood in urine.  Took tylenol and it has helped some.  No other otc meds taken for this. Says glucoses have been 100-120s.  ROS: no fevers, no malaise, no flank pain, no new sexual partners, no hx of STD.  Past Medical History  Diagnosis Date  . HTN (hypertension)     Renal/aortic doppler u/s normal 06/2010  . Diabetes mellitus with complication ([place]) Dx'd fall 2011  . Tobacco dependence   . Nonspecific abnormal electrocardiogram (ECG) (EKG) Fall 2011    TWI in inferior leads: myocardial perfusion scan neg and echo normal 07/2010  . Chronic renal insufficiency, stage III (moderate)     baseline Cr 2 (GFR mid 30s) as of 11/2014; but baseline better (1.6) as of 11/2015.  Marland Kitchen Hypercalcemia   . UTI (lower urinary tract infection)     Feb 2012 (klebsiella--dx at [facility]); 03/2013 e coli.   . Chronic prostatitis     Very mild elevation of PSA (>4), referred to [facility] where PSA repeat was 1.0 on 06/16/13.  Annual PSA repeat is all that is needed now per urologist.  Most recent 07/2015 was 0.45.  Marland Kitchen Hyperlipidemia 2016    Past Surgical History  Procedure Laterality Date  . Colonoscopy  12/20/04;12/2014    2016 no polyps: recall 5 yrs due to [place] of colon ca  . Cataract extraction  2007, 2008    Bilateral ([facility] on [address].  . Esophagogastroduodenoscopy  11/2004    esoph stricture/dilation  . Cardiovascular stress test  07/2010    Myocardial perfusion scan neg/low risk.  . Transthoracic  echocardiogram  07/2010    EF =>55%; LA mildly dilated; trace MR/TR;     Outpatient Prescriptions Prior to Visit  Medication Sig Dispense Refill  . amLODipine (NORVASC) 10 MG tablet TAKE ONE TABLET BY MOUTH ONCE DAILY 90 tablet 3  . aspirin EC 81 MG tablet Take 81 mg by mouth daily.    . carvedilol (COREG) 6.25 MG tablet TAKE ONE-HALF TABLET BY MOUTH IN THE MORNING AND ONE TABLET BY MOUTH IN THE EVENING. 45 tablet 11  . furosemide (LASIX) 40 MG tablet TAKE ONE TABLET BY MOUTH ONCE DAILY 90 tablet 1  . glipiZIDE (GLUCOTROL XL) 5 MG 24 hr tablet TAKE ONE TABLET BY MOUTH ONCE DAILY 90 tablet 1  . losartan (COZAAR) 100 MG tablet TAKE ONE TABLET BY MOUTH ONCE DAILY 30 tablet 11  . pioglitazone (ACTOS) 30 MG tablet TAKE ONE TABLET BY MOUTH ONCE DAILY 30 tablet 6  . rosuvastatin (CRESTOR) 10 MG tablet Take 1 tablet (10 mg total) by mouth daily. 30 tablet 6  . sildenafil (REVATIO) 20 MG tablet 5 tabs po qd prn 50 tablet 6  . amoxicillin-clavulanate (AUGMENTIN) 500-125 MG tablet 1 tab po bid x 10d (Patient not taking: Reported on 12/28/2015) 20 tablet 0   No facility-administered medications prior to visit.    Allergies  Allergen Reactions  .  Bactrim [Sulfamethoxazole-Trimethoprim] Nausea Only    ROS As per HPI  PE: Blood pressure 126/73, pulse 74, temperature 98.6 F (37 C), temperature source Oral, resp. rate 16, height 5' 5.5" (1.664 m), weight 181 lb 4 oz (82.214 kg), SpO2 96 %. Gen: Alert, well appearing.  Patient is oriented to person, place, time, and situation. AFFECT: pleasant, lucid thought and speech. Rectal exam: negative without mass, lesions or tenderness, PROSTATE EXAM: smooth and symmetric without nodules or tenderness.  Mild enlargement of prostate diffusely, plus prostate is diffusely firm but not indurated.   LABS:  CC UA today: 100mg /dl glucose, mod blood, 100mg /dl prot, nitrite neg, Leu small.  IMPRESSION AND PLAN:  UTI, suspect this is due to bph + recent urinary  retention side effect from taking otc antidiarrhea med. He is now back on his stool regimen for constipation. Will send urine for c/s. Start cipro 250mg  bid x 5d.  An After Visit Summary was printed and given to the patient.  FOLLOW UP: Return if symptoms worsen or fail to improve.  Signed:  Crissie Sickles, MD           12/28/2015

## 2015-12-28 NOTE — Progress Notes (Signed)
Pre visit review using our clinic review tool, if applicable. No additional management support is needed unless otherwise documented below in the visit note. 

## 2015-12-30 LAB — URINE CULTURE

## 2016-01-04 ENCOUNTER — Other Ambulatory Visit: Payer: Self-pay | Admitting: Family Medicine

## 2016-01-04 MED ORDER — CIPROFLOXACIN HCL 250 MG PO TABS: 250.0000 mg | ORAL_TABLET | Freq: Two times a day (BID) | ORAL | Status: DC

## 2016-01-04 NOTE — Progress Notes (Signed)
Pls notify pt that I sent him in 5 more days of the antibiotic.-thx

## 2016-01-05 ENCOUNTER — Telehealth: Payer: Self-pay | Admitting: *Deleted

## 2016-01-05 MED ORDER — CEFDINIR 300 MG PO CAPS: 300.0000 mg | ORAL_CAPSULE | Freq: Two times a day (BID) | ORAL | Status: DC

## 2016-01-05 NOTE — Telephone Encounter (Signed)
OK, I had sent in another round of cipro already, so this will have to be cancelled with his pharmacy.  I just sent in cefdinir for him to take instead.-thx

## 2016-01-05 NOTE — Telephone Encounter (Signed)
Pt LMOM on 01/05/16 at 9:54am stating that he is not feeling any better since his last ov. He stated that he is still having pain from the uti. He stated that he finished the antibiotic and he did make him sick. He wants to know if something else can be sent in. Please advise. Thanks.

## 2016-01-05 NOTE — Telephone Encounter (Signed)
Spoke to [place] at [facility] and had her cancel Rx for cipro. Pt advised that cefdinir has been sent in for him to take, pt voiced understanding.

## 2016-02-02 DIAGNOSIS — E0822 Diabetes mellitus due to underlying condition with diabetic chronic kidney disease: Secondary | ICD-10-CM | POA: Diagnosis not present

## 2016-02-02 DIAGNOSIS — I129 Hypertensive chronic kidney disease with stage 1 through stage 4 chronic kidney disease, or unspecified chronic kidney disease: Secondary | ICD-10-CM | POA: Diagnosis not present

## 2016-02-02 DIAGNOSIS — N183 Chronic kidney disease, stage 3 (moderate): Secondary | ICD-10-CM | POA: Diagnosis not present

## 2016-02-29 ENCOUNTER — Ambulatory Visit (INDEPENDENT_AMBULATORY_CARE_PROVIDER_SITE_OTHER): Payer: Medicare Other | Admitting: Family Medicine

## 2016-02-29 ENCOUNTER — Encounter: Payer: Self-pay | Admitting: Family Medicine

## 2016-02-29 ENCOUNTER — Telehealth: Payer: Self-pay | Admitting: *Deleted

## 2016-02-29 VITALS — BP 127/76 | HR 60 | Temp 97.8°F | Resp 16 | Ht 65.5 in | Wt 178.0 lb

## 2016-02-29 DIAGNOSIS — A084 Viral intestinal infection, unspecified: Secondary | ICD-10-CM | POA: Diagnosis not present

## 2016-02-29 MED ORDER — PROMETHAZINE HCL 12.5 MG PO TABS: ORAL_TABLET | ORAL | Status: DC

## 2016-02-29 MED ORDER — ONDANSETRON HCL 8 MG PO TABS: 8.0000 mg | ORAL_TABLET | Freq: Three times a day (TID) | ORAL | Status: DC | PRN

## 2016-02-29 NOTE — Telephone Encounter (Signed)
Pt called stating that Rx for ondansetron is going to cost him $94.00. He is requesting that something else be called in. Please advise. Thanks.

## 2016-02-29 NOTE — Progress Notes (Signed)
OFFICE VISIT  02/29/2016   CC:  Chief Complaint  Patient presents with  . Nausea    x 4 days  . Diarrhea    x 4 days     HPI:    Patient is a 70 y.o. Caucasian male who presents for 4 days of nausea, decreased appetite, approx 3 loose BM's per day.  No vomiting.  No fever.  No abd pain.  Each of the last 2 days he has improved a little bit. Really tired in mornings esp.  Drinking lots of fluids/water, and continues to take all his chronic meds. Recent f/u with nephrologist showed all was stable.  Past Medical History  Diagnosis Date  . HTN (hypertension)     Renal/aortic doppler u/s normal 06/2010  . Diabetes mellitus with complication ([place]) Dx'd fall 2011  . Tobacco dependence   . Nonspecific abnormal electrocardiogram (ECG) (EKG) Fall 2011    TWI in inferior leads: myocardial perfusion scan neg and echo normal 07/2010  . Chronic renal insufficiency, stage III (moderate)     baseline Cr 2 (GFR mid 30s) as of 11/2014; but baseline better (1.6) as of 11/2015.  Marland Kitchen Hypercalcemia   . UTI (lower urinary tract infection)     Feb 2012 (klebsiella--dx at [facility]); 03/2013 e coli.   . Chronic prostatitis     Very mild elevation of PSA (>4), referred to [facility] where PSA repeat was 1.0 on 06/16/13.  Annual PSA repeat is all that is needed now per urologist.  Most recent 07/2015 was 0.45.  Marland Kitchen Hyperlipidemia 2016    Past Surgical History  Procedure Laterality Date  . Colonoscopy  12/20/04;12/2014    2016 no polyps: recall 5 yrs due to [place] of colon ca  . Cataract extraction  2007, 2008    Bilateral ([facility] on [address].  . Esophagogastroduodenoscopy  11/2004    esoph stricture/dilation  . Cardiovascular stress test  07/2010    Myocardial perfusion scan neg/low risk.  . Transthoracic echocardiogram  07/2010    EF =>55%; LA mildly dilated; trace MR/TR;     Outpatient Prescriptions Prior to Visit  Medication Sig Dispense Refill  . amLODipine (NORVASC) 10 MG tablet TAKE  ONE TABLET BY MOUTH ONCE DAILY 90 tablet 3  . aspirin EC 81 MG tablet Take 81 mg by mouth daily.    . carvedilol (COREG) 6.25 MG tablet TAKE ONE-HALF TABLET BY MOUTH IN THE MORNING AND ONE TABLET BY MOUTH IN THE EVENING. 45 tablet 11  . furosemide (LASIX) 40 MG tablet TAKE ONE TABLET BY MOUTH ONCE DAILY 90 tablet 1  . glipiZIDE (GLUCOTROL XL) 5 MG 24 hr tablet TAKE ONE TABLET BY MOUTH ONCE DAILY 90 tablet 1  . losartan (COZAAR) 100 MG tablet TAKE ONE TABLET BY MOUTH ONCE DAILY 30 tablet 11  . pioglitazone (ACTOS) 30 MG tablet TAKE ONE TABLET BY MOUTH ONCE DAILY 30 tablet 6  . rosuvastatin (CRESTOR) 10 MG tablet Take 1 tablet (10 mg total) by mouth daily. 30 tablet 6  . sildenafil (REVATIO) 20 MG tablet 5 tabs po qd prn 50 tablet 6  . cefdinir (OMNICEF) 300 MG capsule Take 1 capsule (300 mg total) by mouth 2 (two) times daily. (Patient not taking: Reported on 02/29/2016) 14 capsule 0   No facility-administered medications prior to visit.    Allergies  Allergen Reactions  . Bactrim [Sulfamethoxazole-Trimethoprim] Nausea Only    ROS As per HPI  PE: Blood pressure 127/76, pulse 60, temperature 97.8 F (36.6  C), temperature source Oral, resp. rate 16, height 5' 5.5" (1.664 m), weight 178 lb (80.74 kg), SpO2 98 %. Gen: Alert, well appearing.  Patient is oriented to person, place, time, and situation. [id number]: no injection, icteris, swelling, or exudate.  EOMI, PERRLA. Mouth: lips without lesion/swelling.  Oral mucosa pink and moist. Oropharynx without erythema, exudate, or swelling.  Neck - No masses or thyromegaly or limitation in range of motion CV: RRR, no m/r/g.   LUNGS: CTA bilat, nonlabored resps, good aeration in all lung fields. ABD: soft, NT, ND, BS normal.  No hepatospenomegaly or mass.  No bruits. EXT: no clubbing, cyanosis, or edema.  Skin - no sores or suspicious lesions or rashes or color changes   LABS:  None today  IMPRESSION AND PLAN:  Viral Gastroenteritis, no  significant dehydration. I encouraged pt to hold his lasix for 1-2 days until he got back to eating/drinking 100%. I rx'd zofran 8mg , 1 tid prn, #20, RF x 1.  An After Visit Summary was printed and given to the patient.  FOLLOW UP: Return if symptoms worsen or fail to improve.  Signed:  Crissie Sickles, MD           02/29/2016

## 2016-02-29 NOTE — Telephone Encounter (Signed)
Pt advised and voiced understanding.   

## 2016-02-29 NOTE — Progress Notes (Signed)
Pre visit review using our clinic review tool, if applicable. No additional management support is needed unless otherwise documented below in the visit note. 

## 2016-02-29 NOTE — Telephone Encounter (Signed)
OK. Generic phenergan rx sent in.

## 2016-03-27 ENCOUNTER — Ambulatory Visit (INDEPENDENT_AMBULATORY_CARE_PROVIDER_SITE_OTHER): Payer: Medicare Other | Admitting: Family Medicine

## 2016-03-27 ENCOUNTER — Encounter: Payer: Self-pay | Admitting: Family Medicine

## 2016-03-27 ENCOUNTER — Ambulatory Visit: Payer: Medicare Other | Admitting: Family Medicine

## 2016-03-27 VITALS — BP 115/72 | HR 57 | Temp 97.8°F | Resp 16 | Ht 65.5 in | Wt 174.8 lb

## 2016-03-27 DIAGNOSIS — N189 Chronic kidney disease, unspecified: Secondary | ICD-10-CM

## 2016-03-27 DIAGNOSIS — F17200 Nicotine dependence, unspecified, uncomplicated: Secondary | ICD-10-CM

## 2016-03-27 DIAGNOSIS — I10 Essential (primary) hypertension: Secondary | ICD-10-CM | POA: Diagnosis not present

## 2016-03-27 DIAGNOSIS — E785 Hyperlipidemia, unspecified: Secondary | ICD-10-CM

## 2016-03-27 DIAGNOSIS — E118 Type 2 diabetes mellitus with unspecified complications: Secondary | ICD-10-CM | POA: Diagnosis not present

## 2016-03-27 DIAGNOSIS — N183 Chronic kidney disease, stage 3 unspecified: Secondary | ICD-10-CM

## 2016-03-27 DIAGNOSIS — K130 Diseases of lips: Secondary | ICD-10-CM

## 2016-03-27 LAB — BASIC METABOLIC PANEL
BUN: 12 mg/dL (ref 6–23)
CHLORIDE: 97 meq/L (ref 96–112)
CO2: 31 mEq/L (ref 19–32)
Calcium: 9.5 mg/dL (ref 8.4–10.5)
Creatinine, Ser: 1.59 mg/dL — ABNORMAL HIGH (ref 0.40–1.50)
GFR: 46.19 mL/min — AB (ref >60.00)
GLUCOSE: 135 mg/dL — AB (ref 70–99)
POTASSIUM: 4 meq/L (ref 3.5–5.1)
SODIUM: 134 meq/L — AB (ref 135–145)

## 2016-03-27 LAB — HEMOGLOBIN A1C: Hgb A1c MFr Bld: 6.5 % (ref 4.6–6.5)

## 2016-03-27 NOTE — Progress Notes (Signed)
Pre visit review using our clinic review tool, if applicable. No additional management support is needed unless otherwise documented below in the visit note. 

## 2016-03-27 NOTE — Progress Notes (Signed)
OFFICE VISIT  03/27/2016   CC:  Chief Complaint  Antonio Serrano presents with  . Follow-up    Pt is not fasting.      HPI:    Antonio Serrano is a 70 y.o. Caucasian male who presents for 4 mo f/u DM 2, HTN, HLD, CRI stage 3. Last f/u we tried crestor (he was intol of atorv and prava).  This caused persistent achiness in both calves so he stopped it.  Daily glucose checks: fasting 90-105.  Compliant with meds and somewhat with diet.  Home bp monitoring: 120-130/80s.  Compliant with meds, no med issues.  Feet: no burning, tingling, or numbness in feet.  Still smoking 10 cigs/day.  Says "I can quit when I want".  Declines med assistance.  Has skin lesion on upper lip, left side.   Past Medical History  Diagnosis Date  . HTN (hypertension)     Renal/aortic doppler u/s normal 06/2010  . Diabetes mellitus with complication ([place]) Dx'd fall 2011  . Tobacco dependence   . Nonspecific abnormal electrocardiogram (ECG) (EKG) Fall 2011    TWI in inferior leads: myocardial perfusion scan neg and echo normal 07/2010  . Chronic renal insufficiency, stage III (moderate)     baseline Cr 2 (GFR mid 30s) as of 11/2014; but baseline better (1.6) as of 11/2015.  Marland Kitchen Hypercalcemia   . UTI (lower urinary tract infection)     Feb 2012 (klebsiella--dx at [facility]); 03/2013 e coli.   . Chronic prostatitis     Very mild elevation of PSA (>4), referred to [facility] where PSA repeat was 1.0 on 06/16/13.  Annual PSA repeat is all that is needed now per urologist.  Most recent 07/2015 was 0.45.  Marland Kitchen Hyperlipidemia 2016    Past Surgical History  Procedure Laterality Date  . Colonoscopy  12/20/04;12/2014    2016 no polyps: recall 5 yrs due to [place] of colon ca  . Cataract extraction  2007, 2008    Bilateral ([facility] on [address].  . Esophagogastroduodenoscopy  11/2004    esoph stricture/dilation  . Cardiovascular stress test  07/2010    Myocardial perfusion scan neg/low risk.  . Transthoracic echocardiogram   07/2010    EF =>55%; LA mildly dilated; trace MR/TR;     Outpatient Prescriptions Prior to Visit  Medication Sig Dispense Refill  . amLODipine (NORVASC) 10 MG tablet TAKE ONE TABLET BY MOUTH ONCE DAILY 90 tablet 3  . aspirin EC 81 MG tablet Take 81 mg by mouth daily.    . carvedilol (COREG) 6.25 MG tablet TAKE ONE-HALF TABLET BY MOUTH IN THE MORNING AND ONE TABLET BY MOUTH IN THE EVENING. 45 tablet 11  . furosemide (LASIX) 40 MG tablet TAKE ONE TABLET BY MOUTH ONCE DAILY 90 tablet 1  . glipiZIDE (GLUCOTROL XL) 5 MG 24 hr tablet TAKE ONE TABLET BY MOUTH ONCE DAILY 90 tablet 1  . losartan (COZAAR) 100 MG tablet TAKE ONE TABLET BY MOUTH ONCE DAILY 30 tablet 11  . ondansetron (ZOFRAN) 8 MG tablet Take 1 tablet (8 mg total) by mouth every 8 (eight) hours as needed for nausea or vomiting. 20 tablet 1  . pioglitazone (ACTOS) 30 MG tablet TAKE ONE TABLET BY MOUTH ONCE DAILY 30 tablet 6  . promethazine (PHENERGAN) 12.5 MG tablet 1-2 tabs po q6h prn nausea 20 tablet 1  . rosuvastatin (CRESTOR) 10 MG tablet Take 1 tablet (10 mg total) by mouth daily. 30 tablet 6  . sildenafil (REVATIO) 20 MG tablet 5 tabs po  qd prn 50 tablet 6   No facility-administered medications prior to visit.    Allergies  Allergen Reactions  . Bactrim [Sulfamethoxazole-Trimethoprim] Nausea Only    ROS As per HPI  PE: Blood pressure 115/72, pulse 57, temperature 97.8 F (36.6 C), temperature source Oral, resp. rate 16, height 5' 5.5" (1.664 m), weight 174 lb 12 oz (79.266 kg), SpO2 96 %. Gen: Alert, well appearing.  Antonio Serrano is oriented to person, place, time, and situation. CV: RRR, no m/r/g.   LUNGS: CTA bilat, nonlabored resps, good aeration in all lung fields. EXT: no clubbing, cyanosis, or edema.  Foot exam - bilateral normal; no swelling, tenderness or skin or vascular lesions. Color and temperature is normal. Sensation is intact. Peripheral pulses are palpable. Toenails are normal. SKIN: above upper lip on left  there is a 1 cm crusty, irregular-shaped papular lesion that is light brown to flesh colored, firm to palpation.  LABS:  No results found for: TSH Lab Results  Component Value Date   WBC 6.4 11/28/2015   HGB 14.6 11/28/2015   HCT 43.1 11/28/2015   MCV 90.0 11/28/2015   PLT 280.0 11/28/2015   Lab Results  Component Value Date   CREATININE 1.60* 11/28/2015   BUN 16 11/28/2015   NA 131* 11/28/2015   K 4.4 11/28/2015   CL 98 11/28/2015   CO2 26 11/28/2015   Lab Results  Component Value Date   ALT 11 11/28/2015   AST 21 11/28/2015   ALKPHOS 94 11/28/2015   BILITOT 0.6 11/28/2015   Lab Results  Component Value Date   CHOL 159 03/31/2015   Lab Results  Component Value Date   HDL 39.70 03/31/2015   Lab Results  Component Value Date   LDLCALC 102* 03/31/2015   Lab Results  Component Value Date   TRIG 87.0 03/31/2015   Lab Results  Component Value Date   CHOLHDL 4 03/31/2015   Lab Results  Component Value Date   PSA 0.45 07/29/2015   PSA 4.08* 05/25/2013   Lab Results  Component Value Date   HGBA1C 6.4 11/28/2015    IMPRESSION AND PLAN:  1) DM 2, good control. Feet exam normal today. Check A1c today. He was reminded of need for updated eye exam.  2) HTN; The current medical regimen is effective;  continue present plan and medications.  3) HLD: his is statin intolerant (failed trial of 3 different statins).  He declines further trial of statin. Not fasting today.  Will recheck FLP at next f/u in 4 mo.  4) CRI stage III: BMET today. Keep appt for f/u with Dr. Mercy Moore 07/2016.  5) Lip lesion: this needs to be excised.  I referred him to the skin surgery center today.  6) Tobacco dependence: I encouraged pt to quit completely and he said he would make an attempt. He declined assistance with any meds at this time.  An After Visit Summary was printed and given to the Antonio Serrano.  FOLLOW UP: Return in about 4 months (around 07/27/2016) for annual CPE  (fasting).  Signed:  Crissie Sickles, MD           03/27/2016

## 2016-04-09 ENCOUNTER — Other Ambulatory Visit: Payer: Self-pay | Admitting: Family Medicine

## 2016-04-11 DIAGNOSIS — D0439 Carcinoma in situ of skin of other parts of face: Secondary | ICD-10-CM | POA: Diagnosis not present

## 2016-04-11 DIAGNOSIS — D1801 Hemangioma of skin and subcutaneous tissue: Secondary | ICD-10-CM | POA: Diagnosis not present

## 2016-04-11 DIAGNOSIS — B078 Other viral warts: Secondary | ICD-10-CM | POA: Diagnosis not present

## 2016-04-11 DIAGNOSIS — L821 Other seborrheic keratosis: Secondary | ICD-10-CM | POA: Diagnosis not present

## 2016-04-11 DIAGNOSIS — L570 Actinic keratosis: Secondary | ICD-10-CM | POA: Diagnosis not present

## 2016-04-11 DIAGNOSIS — D485 Neoplasm of uncertain behavior of skin: Secondary | ICD-10-CM | POA: Diagnosis not present

## 2016-04-11 DIAGNOSIS — L814 Other melanin hyperpigmentation: Secondary | ICD-10-CM | POA: Diagnosis not present

## 2016-05-09 ENCOUNTER — Other Ambulatory Visit: Payer: Self-pay | Admitting: Family Medicine

## 2016-05-18 DIAGNOSIS — D040 Carcinoma in situ of skin of lip: Secondary | ICD-10-CM | POA: Diagnosis not present

## 2016-05-18 DIAGNOSIS — L570 Actinic keratosis: Secondary | ICD-10-CM | POA: Diagnosis not present

## 2016-05-18 DIAGNOSIS — B078 Other viral warts: Secondary | ICD-10-CM | POA: Diagnosis not present

## 2016-05-30 ENCOUNTER — Other Ambulatory Visit: Payer: Self-pay | Admitting: Family Medicine

## 2016-09-04 DIAGNOSIS — Z23 Encounter for immunization: Secondary | ICD-10-CM | POA: Diagnosis not present

## 2016-09-04 DIAGNOSIS — E669 Obesity, unspecified: Secondary | ICD-10-CM | POA: Diagnosis not present

## 2016-09-04 DIAGNOSIS — N183 Chronic kidney disease, stage 3 (moderate): Secondary | ICD-10-CM | POA: Diagnosis not present

## 2016-09-04 DIAGNOSIS — E0822 Diabetes mellitus due to underlying condition with diabetic chronic kidney disease: Secondary | ICD-10-CM | POA: Diagnosis not present

## 2016-09-04 DIAGNOSIS — I129 Hypertensive chronic kidney disease with stage 1 through stage 4 chronic kidney disease, or unspecified chronic kidney disease: Secondary | ICD-10-CM | POA: Diagnosis not present

## 2016-09-18 ENCOUNTER — Telehealth: Payer: Self-pay

## 2016-09-18 NOTE — Telephone Encounter (Signed)
LM requesting call back to schedule AWV. Possibly 09/19/16 at 0900 if okay with patient.

## 2016-09-18 NOTE — Progress Notes (Signed)
Pre visit review using our clinic review tool, if applicable. No additional management support is needed unless otherwise documented below in the visit note. 

## 2016-09-18 NOTE — Progress Notes (Addendum)
Subjective:   Antonio Serrano is a 70 y.o. male who presents for an Initial Medicare Annual Wellness Visit.  The Patient was informed that the wellness visit is to identify future health risk and educate and initiate measures that can reduce risk for increased disease through the lifespan.   Describes health as fair, good or great? "good"  Review of Systems  No ROS.  Medicare Wellness Visit.  Cardiac Risk Factors include: advanced age (>31men, >82 women);diabetes mellitus;dyslipidemia;family history of premature cardiovascular disease;hypertension;male gender   Sleep patterns: Sleeps about 8 hours, up once to void.  Home Safety/Smoke Alarms: Smoke detectors and security in place.  Living environment; residence and Firearm Safety: Lives with wife and dog in 1 story home. Firearms locked away.  Seat Belt Safety/Bike Helmet: Wears seat belt   Counseling:   Eye Exam-Last exam 09/2015, yearly (unsure of providers name). Will make appt.  Dental-Last exam 08/2016, partials. Followed by Lavonne Chick   Male:   [facility]-colonoscopy 01/20/2015, diverticulosis. Recall 5 years.  PSA-07/29/2015, 0.45  Ordered by PCP today.     Objective:    Today's Vitals   09/19/16 0818  BP: 110/68  Pulse: (!) 53  Resp: 16  Temp: 97.7 F (36.5 C)  TempSrc: Oral  SpO2: 100%  Weight: 172 lb 4 oz (78.1 kg)  Height: 5\' 5"  (1.651 m)   Body mass index is 28.66 kg/m.  Current Medications (verified) Outpatient Encounter Prescriptions as of 09/19/2016  Medication Sig  . amLODipine (NORVASC) 10 MG tablet TAKE ONE TABLET BY MOUTH ONCE DAILY  . aspirin EC 81 MG tablet Take 81 mg by mouth daily.  . carvedilol (COREG) 6.25 MG tablet TAKE ONE-HALF TABLET BY MOUTH IN THE MORNING AND ONE TABLET BY MOUTH IN THE EVENING.  . furosemide (LASIX) 40 MG tablet TAKE ONE TABLET BY MOUTH ONCE DAILY  . glipiZIDE (GLUCOTROL XL) 5 MG 24 hr tablet TAKE ONE TABLET BY MOUTH ONCE DAILY  . losartan (COZAAR) 100 MG tablet TAKE ONE  TABLET BY MOUTH ONCE DAILY  . ondansetron (ZOFRAN) 8 MG tablet Take 1 tablet (8 mg total) by mouth every 8 (eight) hours as needed for nausea or vomiting.  . pioglitazone (ACTOS) 30 MG tablet TAKE ONE TABLET BY MOUTH ONCE DAILY  . promethazine (PHENERGAN) 12.5 MG tablet 1-2 tabs po q6h prn nausea  . rosuvastatin (CRESTOR) 10 MG tablet Take 1 tablet (10 mg total) by mouth daily.  . sildenafil (REVATIO) 20 MG tablet 5 tabs po qd prn   No facility-administered encounter medications on file as of 09/19/2016.     Allergies (verified) Bactrim [sulfamethoxazole-trimethoprim]   History: Past Medical History:  Diagnosis Date  . Chronic prostatitis    Very mild elevation of PSA (>4), referred to [facility] where PSA repeat was 1.0 on 06/16/13.  Annual PSA repeat is all that is needed now per urologist.  Most recent 07/2015 was 0.45.  Marland Kitchen Chronic renal insufficiency, stage III (moderate)    baseline Cr 2 (GFR mid 30s) as of 11/2014; but baseline better (1.6) as of 11/2015.  . Diabetes mellitus with complication ([place]) Dx'd fall 2011  . HTN (hypertension)    Renal/aortic doppler u/s normal 06/2010  . Hypercalcemia   . Hyperlipidemia 2016  . Nonspecific abnormal electrocardiogram (ECG) (EKG) Fall 2011   TWI in inferior leads: myocardial perfusion scan neg and echo normal 07/2010  . Tobacco dependence   . UTI (lower urinary tract infection)    Feb 2012 (klebsiella--dx at [facility]); 03/2013 e coli.  Past Surgical History:  Procedure Laterality Date  . CARDIOVASCULAR STRESS TEST  07/2010   Myocardial perfusion scan neg/low risk.  Marland Kitchen CATARACT EXTRACTION  2007, 2008   Bilateral ([facility] on [address].  . COLONOSCOPY  12/20/04;12/2014   2016 no polyps: recall 5 yrs due to [place] of colon ca  . ESOPHAGOGASTRODUODENOSCOPY  11/2004   esoph stricture/dilation  . TRANSTHORACIC ECHOCARDIOGRAM  07/2010   EF =>55%; LA mildly dilated; trace MR/TR;    Family History  Problem Relation Age of Onset    . Dementia Mother   . Pneumonia Father     died in 52's of pneumonia, was otherwise healthy  . Cancer Sister     colon cancer.  Died age 45  . Diabetes Paternal Aunt   . Colon cancer Neg Hx    Social History   Occupational History  . Not on file.   Social History Main Topics  . Smoking status: Current Every Day Smoker    Packs/day: 0.50    Years: 40.00    Types: Cigarettes  . Smokeless tobacco: Never Used  . Alcohol use 0.0 oz/week     Comment: rare alcohol  . Drug use: No  . Sexual activity: Not on file   Tobacco Counseling Ready to quit: Yes Counseling given: Not Answered   Activities of Daily Living In your present state of health, do you have any difficulty performing the following activities: 09/19/2016  Hearing? N  Vision? N  Difficulty concentrating or making decisions? N  Walking or climbing stairs? N  Dressing or bathing? N  Doing errands, shopping? N  Preparing Food and eating ? N  Using the Toilet? N  In the past six months, have you accidently leaked urine? N  Do you have problems with loss of bowel control? N  Managing your Medications? N  Managing your Finances? N  Housekeeping or managing your Housekeeping? N  Some recent data might be hidden    Immunizations and Health Maintenance Immunization History  Administered Date(s) Administered  . Influenza,inj,Quad PF,36+ Mos 05/31/2014, 06/27/2015  . Influenza-Unspecified 09/04/2016  . Pneumococcal Conjugate-13 05/18/2014  . Pneumococcal Polysaccharide-23 07/29/2015   Health Maintenance Due  Topic Date Due  . OPHTHALMOLOGY EXAM  12/08/2015    Patient Care Team: Tammi Sou, MD as PCP - General (Family Medicine) Fleet Contras, MD as Consulting Physician (Nephrology) Chuck Hint (Dentistry)  Indicate any recent Medical Services you may have received from other than [facility] providers in the past year (date may be approximate).    Assessment:   This is a routine wellness examination  for Antonio Serrano. Physical assessment deferred to PCP.   Hearing/Vision screen Hearing Screening Comments: Able to hear conversational tones w/o difficulty. No issues reported.   Vision Screening Comments: Cataract 2008. Reading glasses.   Dietary issues and exercise activities discussed: Current Exercise Habits: The patient does not participate in regular exercise at present (Stays active with housework, yard work. Plays golf 1-2 x week.), Exercise limited by: None identified   Diet (meal preparation, eat out, water intake, caffeinated beverages, dairy products, fruits and vegetables):  Snacks on fruit. Drinks water and 1-2 Pepsi.   Breakfast: eggs, bacon, sausage, waffle, coffee (2 cups) Lunch: sandwich, light meal Dinner: meat and 2 vegetables.   Discussed diabetic, heart healthy diet. Decreasing/substituing Pepsi.  Encouraged to increase exercise routine as tolerated. Discussed Silver Sneakers program.  Goals      Patient Stated   . <enter goal here> (pt-stated)  Maintain current health status by eating healthy food and increasing exercise. Plans to purchase stationary bike.       Depression Screen PHQ 2/9 Scores 09/19/2016 07/29/2015  PHQ - 2 Score 0 0    Fall Risk Fall Risk  09/19/2016 07/29/2015  Falls in the past year? No No    Cognitive Function:       Ad8 score reviewed for issues:  Issues making decisions:no  Less interest in hobbies / activities:no  Repeats questions, stories (family complaining):no  Trouble using ordinary gadgets (microwave, computer, phone):no  Forgets the month or year: no  Mismanaging finances: no  Remembering appts:no  Daily problems with thinking and/or memory:no Ad8 score is=0     Screening Tests Health Maintenance  Topic Date Due  . OPHTHALMOLOGY EXAM  12/08/2015  . Hepatitis C Screening  12/25/2016 (Originally September 08, 1946)  . ZOSTAVAX  09/19/2017 (Originally 10/17/2007)  . HEMOGLOBIN A1C  09/26/2016  . FOOT  EXAM  03/27/2017  . TETANUS/TDAP  10/04/2019  . COLONOSCOPY  01/20/2020  . INFLUENZA VACCINE  Completed  . PNA vac Low Risk Adult  Completed    Patient advised to make eye appointment.      Plan:     Continue to eat heart healthy diet (full of fruits, vegetables, whole grains, lean protein, water--limit salt, fat, and sugar intake) and increase physical activity as tolerated.  Start brain stimulating activities (puzzles, reading, adult coloring books, staying active) to keep memory sharp.   Bring a copy of your advance directives to your next office visit.  Make appt with eye doctor.     During the course of the visit Archer was educated and counseled about the following appropriate screening and preventive services:   Vaccines to include Pneumoccal, Influenza, Hepatitis B, Td, Zostavax, HCV  Colorectal cancer screening  Cardiovascular disease screening  Diabetes screening  Glaucoma screening  Nutrition counseling  Prostate cancer screening   Patient Instructions (the written plan) were given to the patient.   Gerilyn Nestle, RN   09/19/2016

## 2016-09-19 ENCOUNTER — Encounter: Payer: Self-pay | Admitting: Family Medicine

## 2016-09-19 ENCOUNTER — Ambulatory Visit (INDEPENDENT_AMBULATORY_CARE_PROVIDER_SITE_OTHER): Payer: Medicare Other | Admitting: Family Medicine

## 2016-09-19 VITALS — BP 110/68 | HR 53 | Temp 97.7°F | Resp 16 | Ht 65.0 in | Wt 172.2 lb

## 2016-09-19 DIAGNOSIS — E118 Type 2 diabetes mellitus with unspecified complications: Secondary | ICD-10-CM | POA: Diagnosis not present

## 2016-09-19 DIAGNOSIS — Z0000 Encounter for general adult medical examination without abnormal findings: Secondary | ICD-10-CM

## 2016-09-19 DIAGNOSIS — Z125 Encounter for screening for malignant neoplasm of prostate: Secondary | ICD-10-CM | POA: Diagnosis not present

## 2016-09-19 LAB — LIPID PANEL
CHOLESTEROL: 126 mg/dL (ref 0–200)
HDL: 42.2 mg/dL (ref >39.00)
LDL CALC: 63 mg/dL (ref 0–99)
NonHDL: 84.07
TRIGLYCERIDES: 106 mg/dL (ref 0.0–149.0)
Total CHOL/HDL Ratio: 3
VLDL: 21.2 mg/dL (ref 0.0–40.0)

## 2016-09-19 LAB — PSA, MEDICARE: PSA: 0.98 ng/ml (ref 0.10–4.00)

## 2016-09-19 LAB — HEMOGLOBIN A1C: Hgb A1c MFr Bld: 6.3 % (ref 4.6–6.5)

## 2016-09-19 NOTE — Progress Notes (Signed)
Office Note 09/19/2016  CC:  Chief Complaint  Patient presents with  . Annual Exam    Pt is not fasting.     HPI:  Antonio Serrano is a 70 y.o. White male who is here for annual health maintenance exam. Nephrology f/u earlier this month (09/04/16) went well, pt reports Cr holding steady.  I reviewed labs from this visit, Cr 1.57, GFR 45 ml/min--all stable.      Past Medical History:  Diagnosis Date  . Chronic prostatitis    Very mild elevation of PSA (>4), referred to [facility] where PSA repeat was 1.0 on 06/16/13.  Annual PSA repeat is all that is needed now per urologist.  Most recent 07/2015 was 0.45.  Marland Kitchen Chronic renal insufficiency, stage III (moderate)    baseline Cr 2 (GFR mid 30s) as of 11/2014; but baseline better (1.6) as of 11/2015.  . Diabetes mellitus with complication ([place]) Dx'd fall 2011  . HTN (hypertension)    Renal/aortic doppler u/s normal 06/2010  . Hypercalcemia   . Hyperlipidemia 2016  . Nonspecific abnormal electrocardiogram (ECG) (EKG) Fall 2011   TWI in inferior leads: myocardial perfusion scan neg and echo normal 07/2010  . Tobacco dependence   . UTI (lower urinary tract infection)    Feb 2012 (klebsiella--dx at [facility]); 03/2013 e coli.     Past Surgical History:  Procedure Laterality Date  . CARDIOVASCULAR STRESS TEST  07/2010   Myocardial perfusion scan neg/low risk.  Marland Kitchen CATARACT EXTRACTION  2007, 2008   Bilateral ([facility] on [address].  . COLONOSCOPY  12/20/04;12/2014   2016 no polyps: recall 5 yrs due to [place] of colon ca  . ESOPHAGOGASTRODUODENOSCOPY  11/2004   esoph stricture/dilation  . TRANSTHORACIC ECHOCARDIOGRAM  07/2010   EF =>55%; LA mildly dilated; trace MR/TR;     Family History  Problem Relation Age of Onset  . Dementia Mother   . Pneumonia Father     died in 23's of pneumonia, was otherwise healthy  . Cancer Sister     colon cancer.  Died age 66  . Diabetes Paternal Aunt   . Colon cancer Neg Hx     Social  History   Social History  . Marital status: Married    Spouse name: N/A  . Number of children: N/A  . Years of education: N/A   Occupational History  . Not on file.   Social History Main Topics  . Smoking status: Current Every Day Smoker    Packs/day: 0.50    Years: 40.00    Types: Cigarettes  . Smokeless tobacco: Never Used  . Alcohol use 0.0 oz/week     Comment: rare alcohol  . Drug use: No  . Sexual activity: Not on file   Other Topics Concern  . Not on file   Social History Narrative   Married, 4 grown children, 6 grandchildren.   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   Lives in [place].   25 pack-yr tobacco hx--current as of 05/2014.   Drinks about a six pack per week.  Enjoys golf--six handicap at one point.  No formal exercise.   Walks a lot for his job.             Outpatient Medications Prior to Visit  Medication Sig Dispense Refill  . amLODipine (NORVASC) 10 MG tablet TAKE ONE TABLET BY MOUTH ONCE DAILY 90 tablet 3  . aspirin EC 81 MG  tablet Take 81 mg by mouth daily.    . carvedilol (COREG) 6.25 MG tablet TAKE ONE-HALF TABLET BY MOUTH IN THE MORNING AND ONE TABLET BY MOUTH IN THE EVENING. 45 tablet 11  . furosemide (LASIX) 40 MG tablet TAKE ONE TABLET BY MOUTH ONCE DAILY 90 tablet 1  . glipiZIDE (GLUCOTROL XL) 5 MG 24 hr tablet TAKE ONE TABLET BY MOUTH ONCE DAILY 90 tablet 3  . losartan (COZAAR) 100 MG tablet TAKE ONE TABLET BY MOUTH ONCE DAILY 30 tablet 11  . ondansetron (ZOFRAN) 8 MG tablet Take 1 tablet (8 mg total) by mouth every 8 (eight) hours as needed for nausea or vomiting. 20 tablet 1  . pioglitazone (ACTOS) 30 MG tablet TAKE ONE TABLET BY MOUTH ONCE DAILY 30 tablet 3  . promethazine (PHENERGAN) 12.5 MG tablet 1-2 tabs po q6h prn nausea 20 tablet 1  . rosuvastatin (CRESTOR) 10 MG tablet Take 1 tablet (10 mg total) by mouth daily. 30 tablet 6  . sildenafil (REVATIO) 20 MG tablet 5 tabs po  qd prn 50 tablet 6   No facility-administered medications prior to visit.     Allergies  Allergen Reactions  . Bactrim [Sulfamethoxazole-Trimethoprim] Nausea Only    ROS Review of Systems  Constitutional: Negative for appetite change, chills, fatigue and fever.  HENT: Negative for congestion, dental problem, ear pain and sore throat.   Eyes: Negative for discharge, redness and visual disturbance.  Respiratory: Negative for cough, chest tightness, shortness of breath and wheezing.   Cardiovascular: Negative for chest pain, palpitations and leg swelling.  Gastrointestinal: Negative for abdominal pain, blood in stool, diarrhea, nausea and vomiting.  Genitourinary: Negative for difficulty urinating, dysuria, flank pain, frequency, hematuria and urgency.  Musculoskeletal: Negative for arthralgias, back pain, joint swelling, myalgias and neck stiffness.  Skin: Negative for pallor and rash.  Neurological: Negative for dizziness, speech difficulty, weakness and headaches.  Hematological: Negative for adenopathy. Does not bruise/bleed easily.  Psychiatric/Behavioral: Negative for confusion and sleep disturbance. The patient is not nervous/anxious.     PE; Blood pressure 110/68, pulse (!) 53, temperature 97.7 F (36.5 C), temperature source Oral, resp. rate 16, height 5\' 5"  (1.651 m), weight 172 lb 4 oz (78.1 kg), SpO2 100 %. Gen: Alert, well appearing.  Patient is oriented to person, place, time, and situation. AFFECT: pleasant, lucid thought and speech. ENT: Ears: EACs clear, normal epithelium.  TMs with good light reflex and landmarks bilaterally.  Eyes: no injection, icteris, swelling, or exudate.  EOMI, PERRLA. Nose: no drainage or turbinate edema/swelling.  No injection or focal lesion.  Mouth: lips without lesion/swelling.  Oral mucosa pink and moist.  Dentition intact and without obvious caries or gingival swelling.  Oropharynx without erythema, exudate, or swelling.  Neck:  supple/nontender.  No LAD, mass, or TM.  Carotid pulses 2+ bilaterally, without bruits. CV: RRR, no m/r/g.   LUNGS: CTA bilat, nonlabored resps, good aeration in all lung fields. ABD: soft, NT, ND, BS normal.  No hepatospenomegaly or mass.  No bruits. EXT: no clubbing, cyanosis, or edema.  Musculoskeletal: no joint swelling, erythema, warmth, or tenderness.  ROM of all joints intact. Skin - no sores or suspicious lesions or rashes or color changes Rectal exam: negative without mass, lesions or tenderness, PROSTATE EXAM: smooth and symmetric without nodules or tenderness.  Pertinent labs:   Lab Results  Component Value Date   WBC 6.4 11/28/2015   HGB 14.6 11/28/2015   HCT 43.1 11/28/2015   MCV 90.0 11/28/2015  PLT 280.0 11/28/2015   Lab Results  Component Value Date   CREATININE 1.59 (H) 03/27/2016   BUN 12 03/27/2016   NA 134 (L) 03/27/2016   K 4.0 03/27/2016   CL 97 03/27/2016   CO2 31 03/27/2016   Lab Results  Component Value Date   ALT 11 11/28/2015   AST 21 11/28/2015   ALKPHOS 94 11/28/2015   BILITOT 0.6 11/28/2015   Lab Results  Component Value Date   CHOL 159 03/31/2015   Lab Results  Component Value Date   HDL 39.70 03/31/2015   Lab Results  Component Value Date   LDLCALC 102 (H) 03/31/2015   Lab Results  Component Value Date   TRIG 87.0 03/31/2015   Lab Results  Component Value Date   CHOLHDL 4 03/31/2015   Lab Results  Component Value Date   PSA 0.45 07/29/2015   PSA 4.08 (H) 05/25/2013   Lab Results  Component Value Date   HGBA1C 6.5 03/27/2016    ASSESSMENT AND PLAN:   Health maintenance exam: Reviewed age and gender appropriate health maintenance issues (prudent diet, regular exercise, health risks of tobacco and excessive alcohol, use of seatbelts, fire alarms in home, use of sunscreen).  Also reviewed age and gender appropriate health screening as well as vaccine recommendations. Labs today: FLP, A1c, and PSA. Colon cancer  screening: DRE normal, PSA drawn today. Colon ca screening: next colonoscopy 12/2019.  An After Visit Summary was printed and given to the patient.  FOLLOW UP:  Return in about 3 months (around 12/18/2016) for routine chronic illness f/u.  Signed:  Crissie Sickles, MD           09/19/2016

## 2016-09-19 NOTE — Patient Instructions (Addendum)
Continue to eat heart healthy diet (full of fruits, vegetables, whole grains, lean protein, water--limit salt, fat, and sugar intake) and increase physical activity as tolerated.  Start brain stimulating activities (puzzles, reading, adult coloring books, staying active) to keep memory sharp.   Bring a copy of your advance directives to your next office visit.  Make appt with eye doctor.    Fall Prevention in the Home Introduction Falls can cause injuries. They can happen to people of all ages. There are many things you can do to make your home safe and to help prevent falls. What can I do on the outside of my home?  Regularly fix the edges of walkways and driveways and fix any cracks.  Remove anything that might make you trip as you walk through a door, such as a raised step or threshold.  Trim any bushes or trees on the path to your home.  Use bright outdoor lighting.  Clear any walking paths of anything that might make someone trip, such as rocks or tools.  Regularly check to see if handrails are loose or broken. Make sure that both sides of any steps have handrails.  Any raised decks and porches should have guardrails on the edges.  Have any leaves, snow, or ice cleared regularly.  Use sand or salt on walking paths during winter.  Clean up any spills in your garage right away. This includes oil or grease spills. What can I do in the bathroom?  Use night lights.  Install grab bars by the toilet and in the tub and shower. Do not use towel bars as grab bars.  Use non-skid mats or decals in the tub or shower.  If you need to sit down in the shower, use a plastic, non-slip stool.  Keep the floor dry. Clean up any water that spills on the floor as soon as it happens.  Remove soap buildup in the tub or shower regularly.  Attach bath mats securely with double-sided non-slip rug tape.  Do not have throw rugs and other things on the floor that can make you trip. What can I  do in the bedroom?  Use night lights.  Make sure that you have a light by your bed that is easy to reach.  Do not use any sheets or blankets that are too big for your bed. They should not hang down onto the floor.  Have a firm chair that has side arms. You can use this for support while you get dressed.  Do not have throw rugs and other things on the floor that can make you trip. What can I do in the kitchen?  Clean up any spills right away.  Avoid walking on wet floors.  Keep items that you use a lot in easy-to-reach places.  If you need to reach something above you, use a strong step stool that has a grab bar.  Keep electrical cords out of the way.  Do not use floor polish or wax that makes floors slippery. If you must use wax, use non-skid floor wax.  Do not have throw rugs and other things on the floor that can make you trip. What can I do with my stairs?  Do not leave any items on the stairs.  Make sure that there are handrails on both sides of the stairs and use them. Fix handrails that are broken or loose. Make sure that handrails are as long as the stairways.  Check any carpeting to make sure that  it is firmly attached to the stairs. Fix any carpet that is loose or worn.  Avoid having throw rugs at the top or bottom of the stairs. If you do have throw rugs, attach them to the floor with carpet tape.  Make sure that you have a light switch at the top of the stairs and the bottom of the stairs. If you do not have them, ask someone to add them for you. What else can I do to help prevent falls?  Wear shoes that:  Do not have high heels.  Have rubber bottoms.  Are comfortable and fit you well.  Are closed at the toe. Do not wear sandals.  If you use a stepladder:  Make sure that it is fully opened. Do not climb a closed stepladder.  Make sure that both sides of the stepladder are locked into place.  Ask someone to hold it for you, if possible.  Clearly mark  and make sure that you can see:  Any grab bars or handrails.  First and last steps.  Where the edge of each step is.  Use tools that help you move around (mobility aids) if they are needed. These include:  Canes.  Walkers.  Scooters.  Crutches.  Turn on the lights when you go into a dark area. Replace any light bulbs as soon as they burn out.  Set up your furniture so you have a clear path. Avoid moving your furniture around.  If any of your floors are uneven, fix them.  If there are any pets around you, be aware of where they are.  Review your medicines with your doctor. Some medicines can make you feel dizzy. This can increase your chance of falling. Ask your doctor what other things that you can do to help prevent falls. This information is not intended to replace advice given to you by your health care provider. Make sure you discuss any questions you have with your health care provider. Document Released: 07/14/2009 Document Revised: 02/23/2016 Document Reviewed: 10/22/2014  2017 Elsevier  Health Maintenance, Male A healthy lifestyle and preventative care can promote health and wellness.  Maintain regular health, dental, and eye exams.  Eat a healthy diet. Foods like vegetables, fruits, whole grains, low-fat dairy products, and lean protein foods contain the nutrients you need and are low in calories. Decrease your intake of foods high in solid fats, added sugars, and salt. Get information about a proper diet from your health care provider, if necessary.  Regular physical exercise is one of the most important things you can do for your health. Most adults should get at least 150 minutes of moderate-intensity exercise (any activity that increases your heart rate and causes you to sweat) each week. In addition, most adults need muscle-strengthening exercises on 2 or more days a week.   Maintain a healthy weight. The body mass index (BMI) is a screening tool to identify  possible weight problems. It provides an estimate of body fat based on height and weight. Your health care provider can find your BMI and can help you achieve or maintain a healthy weight. For males 20 years and older:  A BMI below 18.5 is considered underweight.  A BMI of 18.5 to 24.9 is normal.  A BMI of 25 to 29.9 is considered overweight.  A BMI of 30 and above is considered obese.  Maintain normal blood lipids and cholesterol by exercising and minimizing your intake of saturated fat. Eat a balanced diet with plenty  of fruits and vegetables. Blood tests for lipids and cholesterol should begin at age 80 and be repeated every 5 years. If your lipid or cholesterol levels are high, you are over age 24, or you are at high risk for heart disease, you may need your cholesterol levels checked more frequently.Ongoing high lipid and cholesterol levels should be treated with medicines if diet and exercise are not working.  If you smoke, find out from your health care provider how to quit. If you do not use tobacco, do not start.  Lung cancer screening is recommended for adults aged 24-80 years who are at high risk for developing lung cancer because of a history of smoking. A yearly low-dose CT scan of the lungs is recommended for people who have at least a 30-pack-year history of smoking and are current smokers or have quit within the past 15 years. A pack year of smoking is smoking an average of 1 pack of cigarettes a day for 1 year (for example, a 30-pack-year history of smoking could mean smoking 1 pack a day for 30 years or 2 packs a day for 15 years). Yearly screening should continue until the smoker has stopped smoking for at least 15 years. Yearly screening should be stopped for people who develop a health problem that would prevent them from having lung cancer treatment.  If you choose to drink alcohol, do not have more than 2 drinks per day. One drink is considered to be 12 oz (360 mL) of beer, 5  oz (150 mL) of wine, or 1.5 oz (45 mL) of liquor.  Avoid the use of street drugs. Do not share needles with anyone. Ask for help if you need support or instructions about stopping the use of drugs.  High blood pressure causes heart disease and increases the risk of stroke. High blood pressure is more likely to develop in:  People who have blood pressure in the end of the normal range (100-139/85-89 mm Hg).  People who are overweight or obese.  People who are African American.  If you are 48-82 years of age, have your blood pressure checked every 3-5 years. If you are 88 years of age or older, have your blood pressure checked every year. You should have your blood pressure measured twice-once when you are at a hospital or clinic, and once when you are not at a hospital or clinic. Record the average of the two measurements. To check your blood pressure when you are not at a hospital or clinic, you can use:  An automated blood pressure machine at a pharmacy.  A home blood pressure monitor.  If you are 58-34 years old, ask your health care provider if you should take aspirin to prevent heart disease.  Diabetes screening involves taking a blood sample to check your fasting blood sugar level. This should be done once every 3 years after age 46 if you are at a normal weight and without risk factors for diabetes. Testing should be considered at a younger age or be carried out more frequently if you are overweight and have at least 1 risk factor for diabetes.  Colorectal cancer can be detected and often prevented. Most routine colorectal cancer screening begins at the age of 44 and continues through age 22. However, your health care provider may recommend screening at an earlier age if you have risk factors for colon cancer. On a yearly basis, your health care provider may provide home test kits to check for hidden blood  in the stool. A small camera at the end of a tube may be used to directly examine  the colon (sigmoidoscopy or colonoscopy) to detect the earliest forms of colorectal cancer. Talk to your health care provider about this at age 57 when routine screening begins. A direct exam of the colon should be repeated every 5-10 years through age 41, unless early forms of precancerous polyps or small growths are found.  People who are at an increased risk for hepatitis B should be screened for this virus. You are considered at high risk for hepatitis B if:  You were born in a country where hepatitis B occurs often. Talk with your health care provider about which countries are considered high risk.  Your parents were born in a high-risk country and you have not received a shot to protect against hepatitis B (hepatitis B vaccine).  You have HIV or AIDS.  You use needles to inject street drugs.  You live with, or have sex with, someone who has hepatitis B.  You are a man who has sex with other men (MSM).  You get hemodialysis treatment.  You take certain medicines for conditions like cancer, organ transplantation, and autoimmune conditions.  Hepatitis C blood testing is recommended for all people born from [birth year] through 1963 and any individual with known risk factors for hepatitis C.  Healthy men should no longer receive prostate-specific antigen (PSA) blood tests as part of routine cancer screening. Talk to your health care provider about prostate cancer screening.  Testicular cancer screening is not recommended for adolescents or adult males who have no symptoms. Screening includes self-exam, a health care provider exam, and other screening tests. Consult with your health care provider about any symptoms you have or any concerns you have about testicular cancer.  Practice safe sex. Use condoms and avoid high-risk sexual practices to reduce the spread of sexually transmitted infections (STIs).  You should be screened for STIs, including gonorrhea and chlamydia if:  You are sexually  active and are younger than 24 years.  You are older than 24 years, and your health care provider tells you that you are at risk for this type of infection.  Your sexual activity has changed since you were last screened, and you are at an increased risk for chlamydia or gonorrhea. Ask your health care provider if you are at risk.  If you are at risk of being infected with HIV, it is recommended that you take a prescription medicine daily to prevent HIV infection. This is called pre-exposure prophylaxis (PrEP). You are considered at risk if:  You are a man who has sex with other men (MSM).  You are a heterosexual man who is sexually active with multiple partners.  You take drugs by injection.  You are sexually active with a partner who has HIV.  Talk with your health care provider about whether you are at high risk of being infected with HIV. If you choose to begin PrEP, you should first be tested for HIV. You should then be tested every 3 months for as long as you are taking PrEP.  Use sunscreen. Apply sunscreen liberally and repeatedly throughout the day. You should seek shade when your shadow is shorter than you. Protect yourself by wearing long sleeves, pants, a wide-brimmed hat, and sunglasses year round whenever you are outdoors.  Tell your health care provider of new moles or changes in moles, especially if there is a change in shape or color. Also, tell your  health care provider if a mole is larger than the size of a pencil eraser.  A one-time screening for abdominal aortic aneurysm (AAA) and surgical repair of large AAAs by ultrasound is recommended for men aged 70-75 years who are current or former smokers.  Stay current with your vaccines (immunizations). This information is not intended to replace advice given to you by your health care provider. Make sure you discuss any questions you have with your health care provider. Document Released: 03/15/2008 Document Revised: 10/08/2014  Document Reviewed: 06/21/2015 Elsevier Interactive Patient Education  2017 [employer].

## 2016-09-19 NOTE — Addendum Note (Signed)
Addended by: Gerilyn Nestle on: 09/19/2016 09:24 AM   Modules accepted: Miquel Dunn

## 2016-09-21 NOTE — Progress Notes (Signed)
Reviewed AWV and agree.  Signed:  Phil Kimmarie Pascale, MD           09/21/2016  

## 2016-10-07 ENCOUNTER — Other Ambulatory Visit: Payer: Self-pay | Admitting: Family Medicine

## 2016-11-27 ENCOUNTER — Other Ambulatory Visit: Payer: Self-pay | Admitting: Family Medicine

## 2016-12-06 ENCOUNTER — Other Ambulatory Visit: Payer: Self-pay | Admitting: Family Medicine

## 2016-12-06 NOTE — Telephone Encounter (Signed)
Antonio Serrano Dr.   Cheral Almas request for losartan LOV: 09/19/16 Next ov: 12/19/16 Last written: 11/21/15 #30 w/ 11RF

## 2016-12-09 ENCOUNTER — Other Ambulatory Visit: Payer: Self-pay | Admitting: Family Medicine

## 2016-12-11 ENCOUNTER — Other Ambulatory Visit: Payer: Self-pay | Admitting: *Deleted

## 2016-12-11 MED ORDER — PIOGLITAZONE HCL 30 MG PO TABS: 30.0000 mg | ORAL_TABLET | Freq: Every day | ORAL | 1 refills | Status: DC

## 2016-12-11 NOTE — Telephone Encounter (Signed)
Antonio Serrano Dr. Requesting 90 day supply.  RF request for pioglitazone LOV: 09/19/16 Next ov: 12/19/16 Last written: 10/08/16 #30 w/ 3RF

## 2016-12-19 ENCOUNTER — Encounter: Payer: Self-pay | Admitting: Family Medicine

## 2016-12-19 ENCOUNTER — Ambulatory Visit (INDEPENDENT_AMBULATORY_CARE_PROVIDER_SITE_OTHER): Payer: Medicare Other | Admitting: Family Medicine

## 2016-12-19 VITALS — BP 93/57 | HR 61 | Temp 98.0°F | Resp 16 | Ht 65.0 in | Wt 174.5 lb

## 2016-12-19 DIAGNOSIS — I10 Essential (primary) hypertension: Secondary | ICD-10-CM

## 2016-12-19 DIAGNOSIS — I959 Hypotension, unspecified: Secondary | ICD-10-CM | POA: Diagnosis not present

## 2016-12-19 DIAGNOSIS — N183 Chronic kidney disease, stage 3 unspecified: Secondary | ICD-10-CM

## 2016-12-19 DIAGNOSIS — E1121 Type 2 diabetes mellitus with diabetic nephropathy: Secondary | ICD-10-CM | POA: Diagnosis not present

## 2016-12-19 DIAGNOSIS — E7800 Pure hypercholesterolemia, unspecified: Secondary | ICD-10-CM

## 2016-12-19 LAB — COMPREHENSIVE METABOLIC PANEL
ALT: 9 U/L (ref 0–53)
AST: 12 U/L (ref 0–37)
Albumin: 4 g/dL (ref 3.5–5.2)
Alkaline Phosphatase: 113 U/L (ref 39–117)
BILIRUBIN TOTAL: 0.4 mg/dL (ref 0.2–1.2)
BUN: 11 mg/dL (ref 6–23)
CO2: 30 meq/L (ref 19–32)
Calcium: 9.3 mg/dL (ref 8.4–10.5)
Chloride: 100 mEq/L (ref 96–112)
Creatinine, Ser: 1.55 mg/dL — ABNORMAL HIGH (ref 0.40–1.50)
GFR: 47.46 mL/min — AB (ref >60.00)
GLUCOSE: 170 mg/dL — AB (ref 70–99)
Potassium: 4.1 mEq/L (ref 3.5–5.1)
Sodium: 136 mEq/L (ref 135–145)
Total Protein: 6.4 g/dL (ref 6.0–8.3)

## 2016-12-19 LAB — HEMOGLOBIN A1C: HEMOGLOBIN A1C: 6.5 % (ref 4.6–6.5)

## 2016-12-19 NOTE — Patient Instructions (Signed)
Stop your cozaar (losartan) 100mg  tab.  Stop your actos (pioglitazone) 30 mg tab.

## 2016-12-19 NOTE — Progress Notes (Signed)
Pre visit review using our clinic review tool, if applicable. No additional management support is needed unless otherwise documented below in the visit note. 

## 2016-12-19 NOTE — Progress Notes (Signed)
OFFICE VISIT  12/19/2016   CC:  Chief Complaint  Patient presents with  . Follow-up    RCI, pt is not fasting.    HPI:    Patient is a 71 y.o. Caucasian male who presents for 3 mo f/u DM 2, HTN, HLD. Has CRI stage III and is followed regularly by Dr. Mercy Moore in nephrology. Goes back to see him the end of May this year.  Fasting glucoses usually <100.  Had one hypoglycemic episode the day after he ate very little. Says he is eating well and drinking plenty of fluids.  HTN: no home bp monitoring.  Needs new monitor. Compliant with bp meds. No orthostatic dizziness.  Past Medical History:  Diagnosis Date  . Chronic prostatitis    Very mild elevation of PSA (>4), referred to Urol where PSA repeat was 1.0 on 06/16/13.  Annual PSA repeat is all that is needed now per urologist.  Most recent 07/2015 was 0.45.  Marland Kitchen Chronic renal insufficiency, stage III (moderate)    baseline Cr 2 (GFR mid 30s) as of 11/2014; but baseline better (1.6) as of 11/2015.  . Diabetes mellitus with complication ([place]) Dx'd fall 2011  . HTN (hypertension)    Renal/aortic doppler u/s normal 06/2010  . Hypercalcemia   . Hyperlipidemia 2016   Statin intolerant.  Pt refuses any further trial of statin as of 2018.  . Nonspecific abnormal electrocardiogram (ECG) (EKG) Fall 2011   TWI in inferior leads: myocardial perfusion scan neg and echo normal 07/2010  . Tobacco dependence   . UTI (lower urinary tract infection)    Feb 2012 (klebsiella--dx at [facility]); 03/2013 e coli.     Past Surgical History:  Procedure Laterality Date  . CARDIOVASCULAR STRESS TEST  07/2010   Myocardial perfusion scan neg/low risk.  Marland Kitchen CATARACT EXTRACTION  2007, 2008   Bilateral ([facility] on [address].  . COLONOSCOPY  12/20/04;12/2014   2016 no polyps: recall 5 yrs due to [place] of colon ca  . ESOPHAGOGASTRODUODENOSCOPY  11/2004   esoph stricture/dilation  . TRANSTHORACIC ECHOCARDIOGRAM  07/2010   EF =>55%; LA mildly  dilated; trace MR/TR;    MEDS: of note, pt is NOT taking crestor as listed below. Outpatient Medications Prior to Visit  Medication Sig Dispense Refill  . amLODipine (NORVASC) 10 MG tablet TAKE ONE TABLET BY MOUTH ONCE DAILY 90 tablet 3  . aspirin EC 81 MG tablet Take 81 mg by mouth daily.    . carvedilol (COREG) 6.25 MG tablet TAKE ONE-HALF TABLET BY MOUTH IN THE MORNING, THEN TAKE ONE IN THE EVENING DAILY 45 tablet 11  . furosemide (LASIX) 40 MG tablet TAKE ONE TABLET BY MOUTH ONCE DAILY 90 tablet 1  . glipiZIDE (GLUCOTROL XL) 5 MG 24 hr tablet TAKE ONE TABLET BY MOUTH ONCE DAILY 90 tablet 3  . sildenafil (REVATIO) 20 MG tablet 5 tabs po qd prn 50 tablet 6  . losartan (COZAAR) 100 MG tablet TAKE ONE TABLET BY MOUTH ONCE DAILY 30 tablet 6  . pioglitazone (ACTOS) 30 MG tablet Take 1 tablet (30 mg total) by mouth daily. 90 tablet 1  . rosuvastatin (CRESTOR) 10 MG tablet Take 1 tablet (10 mg total) by mouth daily. 30 tablet 6  . ondansetron (ZOFRAN) 8 MG tablet Take 1 tablet (8 mg total) by mouth every 8 (eight) hours as needed for nausea or vomiting. (Patient not taking: Reported on 12/19/2016) 20 tablet 1  . promethazine (PHENERGAN) 12.5 MG tablet 1-2 tabs po q6h  prn nausea (Patient not taking: Reported on 12/19/2016) 20 tablet 1   No facility-administered medications prior to visit.     Allergies  Allergen Reactions  . Bactrim [Sulfamethoxazole-Trimethoprim] Nausea Only    ROS As per HPI  PE: Blood pressure (!) 93/57, pulse 61, temperature 98 F (36.7 C), temperature source Oral, resp. rate 16, height 5\' 5"  (1.651 m), weight 174 lb 8 oz (79.2 kg), SpO2 98 %. Gen: Alert, well appearing.  Patient is oriented to person, place, time, and situation. AFFECT: pleasant, lucid thought and speech. CV: RRR, no m/r/g.   LUNGS: CTA bilat, nonlabored resps, good aeration in all lung fields. EXT: no clubbing, cyanosis, or edema.   LABS:   Lab Results  Component Value Date   WBC 6.4  11/28/2015   HGB 14.6 11/28/2015   HCT 43.1 11/28/2015   MCV 90.0 11/28/2015   PLT 280.0 11/28/2015   Lab Results  Component Value Date   CREATININE 1.59 (H) 03/27/2016   BUN 12 03/27/2016   NA 134 (L) 03/27/2016   K 4.0 03/27/2016   CL 97 03/27/2016   CO2 31 03/27/2016   Lab Results  Component Value Date   ALT 11 11/28/2015   AST 21 11/28/2015   ALKPHOS 94 11/28/2015   BILITOT 0.6 11/28/2015   Lab Results  Component Value Date   CHOL 126 09/19/2016   Lab Results  Component Value Date   HDL 42.20 09/19/2016   Lab Results  Component Value Date   LDLCALC 63 09/19/2016   Lab Results  Component Value Date   TRIG 106.0 09/19/2016   Lab Results  Component Value Date   CHOLHDL 3 09/19/2016   Lab Results  Component Value Date   PSA 0.98 09/19/2016   PSA 0.45 07/29/2015   PSA 4.08 (H) 05/25/2013   Lab Results  Component Value Date   HGBA1C 6.3 09/19/2016   IMPRESSION AND PLAN:  1) Hypertension: bp actually low in office today.  Last few measurements in office low-normal. Will d/c cozaar today and have him buy BP monitor for home, check bp daily. Check lytes/cr today.  2) DM 2; check HbA1c today. He reports near low glucoses most mornings, and one hypoglycemic rxn since last visit. I'll have him start monitoring a few 2H postprandial glucoses per week to get more data. For now, d/c actos 30 mg qd.  3) Hyperlipidemia: he does not tolerate statins, declines any further trial of statin.  Lipid panel good 08/2016.  4) CRI stage III: Checking lytes/cr today. Followed by Dr. Mercy Moore, next f/u 01/2017. Will fax him a copy of today's office note so he is aware of the changes made to med regimen.  An After Visit Summary was printed and given to the patient.  FOLLOW UP: Return in about 3 months (around 03/21/2017) for routine chronic illness f/u.  Signed:  Crissie Sickles, MD           12/19/2016

## 2017-03-21 ENCOUNTER — Ambulatory Visit: Payer: Medicare Other | Admitting: Family Medicine

## 2017-03-21 NOTE — Progress Notes (Deleted)
OFFICE VISIT  03/21/2017   CC: No chief complaint on file.    HPI:    Patient is a 71 y.o. Caucasian male who presents for 3 mo f/u DM 2, HTN, CRI stage III. Has hx of hyperlipidemia but he is statin intolerant--refuses any further trial of statin med.  Past Medical History:  Diagnosis Date  . Chronic prostatitis    Very mild elevation of PSA (>4), referred to Urol where PSA repeat was 1.0 on 06/16/13.  Annual PSA repeat is all that is needed now per urologist.  Most recent 07/2015 was 0.45.  Marland Kitchen Chronic renal insufficiency, stage III (moderate)    baseline Cr 2 (GFR mid 30s) as of 11/2014; but baseline better (1.6) as of 11/2015.  . Diabetes mellitus with complication ([place]) Dx'd fall 2011  . HTN (hypertension)    Renal/aortic doppler u/s normal 06/2010  . Hypercalcemia   . Hyperlipidemia 2016   Statin intolerant.  Pt refuses any further trial of statin as of 2018.  . Nonspecific abnormal electrocardiogram (ECG) (EKG) Fall 2011   TWI in inferior leads: myocardial perfusion scan neg and echo normal 07/2010  . Tobacco dependence   . UTI (lower urinary tract infection)    Feb 2012 (klebsiella--dx at [facility]); 03/2013 e coli.     Past Surgical History:  Procedure Laterality Date  . CARDIOVASCULAR STRESS TEST  07/2010   Myocardial perfusion scan neg/low risk.  Marland Kitchen CATARACT EXTRACTION  2007, 2008   Bilateral ([facility] on [address].  . COLONOSCOPY  12/20/04;12/2014   2016 no polyps: recall 5 yrs due to [place] of colon ca  . ESOPHAGOGASTRODUODENOSCOPY  11/2004   esoph stricture/dilation  . TRANSTHORACIC ECHOCARDIOGRAM  07/2010   EF =>55%; LA mildly dilated; trace MR/TR;     Outpatient Medications Prior to Visit  Medication Sig Dispense Refill  . amLODipine (NORVASC) 10 MG tablet TAKE ONE TABLET BY MOUTH ONCE DAILY 90 tablet 3  . aspirin EC 81 MG tablet Take 81 mg by mouth daily.    . carvedilol (COREG) 6.25 MG tablet TAKE ONE-HALF TABLET BY MOUTH IN THE MORNING, THEN  TAKE ONE IN THE EVENING DAILY 45 tablet 11  . furosemide (LASIX) 40 MG tablet TAKE ONE TABLET BY MOUTH ONCE DAILY 90 tablet 1  . glipiZIDE (GLUCOTROL XL) 5 MG 24 hr tablet TAKE ONE TABLET BY MOUTH ONCE DAILY 90 tablet 3  . sildenafil (REVATIO) 20 MG tablet 5 tabs po qd prn 50 tablet 6   No facility-administered medications prior to visit.     Allergies  Allergen Reactions  . Bactrim [Sulfamethoxazole-Trimethoprim] Nausea Only    ROS As per HPI  PE: There were no vitals taken for this visit. ***  LABS:  No results found for: TSH Lab Results  Component Value Date   WBC 6.4 11/28/2015   HGB 14.6 11/28/2015   HCT 43.1 11/28/2015   MCV 90.0 11/28/2015   PLT 280.0 11/28/2015   Lab Results  Component Value Date   CREATININE 1.55 (H) 12/19/2016   BUN 11 12/19/2016   NA 136 12/19/2016   K 4.1 12/19/2016   CL 100 12/19/2016   CO2 30 12/19/2016   Lab Results  Component Value Date   ALT 9 12/19/2016   AST 12 12/19/2016   ALKPHOS 113 12/19/2016   BILITOT 0.4 12/19/2016   Lab Results  Component Value Date   CHOL 126 09/19/2016   Lab Results  Component Value Date   HDL 42.20 09/19/2016  Lab Results  Component Value Date   LDLCALC 63 09/19/2016   Lab Results  Component Value Date   TRIG 106.0 09/19/2016   Lab Results  Component Value Date   CHOLHDL 3 09/19/2016   Lab Results  Component Value Date   PSA 0.98 09/19/2016   PSA 0.45 07/29/2015   PSA 4.08 (H) 05/25/2013   Lab Results  Component Value Date   HGBA1C 6.5 12/19/2016   IMPRESSION AND PLAN:  No problem-specific Assessment & Plan notes found for this encounter.   FOLLOW UP: No Follow-up on file.

## 2017-03-22 ENCOUNTER — Ambulatory Visit (INDEPENDENT_AMBULATORY_CARE_PROVIDER_SITE_OTHER): Payer: Medicare Other | Admitting: Family Medicine

## 2017-03-22 ENCOUNTER — Encounter: Payer: Self-pay | Admitting: Family Medicine

## 2017-03-22 VITALS — BP 117/68 | HR 41 | Temp 97.5°F | Resp 16 | Wt 166.0 lb

## 2017-03-22 DIAGNOSIS — I10 Essential (primary) hypertension: Secondary | ICD-10-CM

## 2017-03-22 DIAGNOSIS — N183 Chronic kidney disease, stage 3 unspecified: Secondary | ICD-10-CM

## 2017-03-22 DIAGNOSIS — I129 Hypertensive chronic kidney disease with stage 1 through stage 4 chronic kidney disease, or unspecified chronic kidney disease: Secondary | ICD-10-CM | POA: Diagnosis not present

## 2017-03-22 DIAGNOSIS — E118 Type 2 diabetes mellitus with unspecified complications: Secondary | ICD-10-CM

## 2017-03-22 DIAGNOSIS — J301 Allergic rhinitis due to pollen: Secondary | ICD-10-CM

## 2017-03-22 DIAGNOSIS — E669 Obesity, unspecified: Secondary | ICD-10-CM | POA: Diagnosis not present

## 2017-03-22 DIAGNOSIS — Z1159 Encounter for screening for other viral diseases: Secondary | ICD-10-CM

## 2017-03-22 DIAGNOSIS — N2889 Other specified disorders of kidney and ureter: Secondary | ICD-10-CM

## 2017-03-22 DIAGNOSIS — Z9189 Other specified personal risk factors, not elsewhere classified: Secondary | ICD-10-CM | POA: Diagnosis not present

## 2017-03-22 DIAGNOSIS — E0822 Diabetes mellitus due to underlying condition with diabetic chronic kidney disease: Secondary | ICD-10-CM | POA: Diagnosis not present

## 2017-03-22 LAB — HEMOGLOBIN A1C: HEMOGLOBIN A1C: 7.5 % — AB (ref 4.6–6.5)

## 2017-03-22 MED ORDER — PIOGLITAZONE HCL 15 MG PO TABS: 15.0000 mg | ORAL_TABLET | Freq: Every day | ORAL | 6 refills | Status: DC

## 2017-03-22 MED ORDER — FEXOFENADINE HCL 60 MG PO TABS: ORAL_TABLET | ORAL | 1 refills | Status: DC

## 2017-03-22 NOTE — Progress Notes (Addendum)
OFFICE VISIT  03/22/2017   CC:  Chief Complaint  Patient presents with  . Diabetes    follow up   HPI:    Patient is a 71 y.o. Caucasian male who presents for f/u DM 2, HTN, CRI stage III.  DM 2: avg 140s fasting, 2H PP 180s.  No change in diet. Last visit we stopped his actos 30mg  b/c of some borderline glucoses.  HTN: 120s/70 consistently, HR 50s-60s. His nephrologist drew blood for renal panel this morning and he got urine for protein/cr ratio.  CRI: avoids NSAIDs, hydrating well.  C/o PND with some nighttime cough x 3 weeks, waxing and waning.  Past Medical History:  Diagnosis Date  . Chronic prostatitis    Very mild elevation of PSA (>4), referred to Urol where PSA repeat was 1.0 on 06/16/13.  Annual PSA repeat is all that is needed now per urologist.  Most recent 07/2015 was 0.45.  Marland Kitchen Chronic renal insufficiency, stage III (moderate)    baseline Cr 2 (GFR mid 30s) as of 11/2014; but baseline better (1.6) as of 11/2015.  . Diabetes mellitus with complication ([place]) Dx'd fall 2011  . HTN (hypertension)    Renal/aortic doppler u/s normal 06/2010  . Hypercalcemia   . Hyperlipidemia 2016   Statin intolerant.  Pt refuses any further trial of statin as of 2018.  . Nonspecific abnormal electrocardiogram (ECG) (EKG) Fall 2011   TWI in inferior leads: myocardial perfusion scan neg and echo normal 07/2010  . Tobacco dependence   . UTI (lower urinary tract infection)    Feb 2012 (klebsiella--dx at [facility]); 03/2013 e coli.     Past Surgical History:  Procedure Laterality Date  . CARDIOVASCULAR STRESS TEST  07/2010   Myocardial perfusion scan neg/low risk.  Marland Kitchen CATARACT EXTRACTION  2007, 2008   Bilateral ([facility] on [address].  . COLONOSCOPY  12/20/04;12/2014   2016 no polyps: recall 5 yrs due to [place] of colon ca  . ESOPHAGOGASTRODUODENOSCOPY  11/2004   esoph stricture/dilation  . TRANSTHORACIC ECHOCARDIOGRAM  07/2010   EF =>55%; LA mildly dilated; trace MR/TR;      Outpatient Medications Prior to Visit  Medication Sig Dispense Refill  . amLODipine (NORVASC) 10 MG tablet TAKE ONE TABLET BY MOUTH ONCE DAILY 90 tablet 3  . aspirin EC 81 MG tablet Take 81 mg by mouth daily.    . carvedilol (COREG) 6.25 MG tablet TAKE ONE-HALF TABLET BY MOUTH IN THE MORNING, THEN TAKE ONE IN THE EVENING DAILY 45 tablet 11  . furosemide (LASIX) 40 MG tablet TAKE ONE TABLET BY MOUTH ONCE DAILY 90 tablet 1  . glipiZIDE (GLUCOTROL XL) 5 MG 24 hr tablet TAKE ONE TABLET BY MOUTH ONCE DAILY 90 tablet 3  . sildenafil (REVATIO) 20 MG tablet 5 tabs po qd prn 50 tablet 6   No facility-administered medications prior to visit.     Allergies  Allergen Reactions  . Bactrim [Sulfamethoxazole-Trimethoprim] Nausea Only    ROS As per HPI  PE: Blood pressure 117/68, pulse (!) 41, temperature 97.5 F (36.4 C), temperature source Oral, resp. rate 16, weight 166 lb (75.3 kg), SpO2 97 %. Gen: Alert, well appearing.  Patient is oriented to person, place, time, and situation. AFFECT: pleasant, lucid thought and speech. No further exam today.  LABS:   Lab Results  Component Value Date   WBC 6.4 11/28/2015   HGB 14.6 11/28/2015   HCT 43.1 11/28/2015   MCV 90.0 11/28/2015   PLT 280.0 11/28/2015  Lab Results  Component Value Date   CREATININE 1.55 (H) 12/19/2016   BUN 11 12/19/2016   NA 136 12/19/2016   K 4.1 12/19/2016   CL 100 12/19/2016   CO2 30 12/19/2016   Lab Results  Component Value Date   ALT 9 12/19/2016   AST 12 12/19/2016   ALKPHOS 113 12/19/2016   BILITOT 0.4 12/19/2016   Lab Results  Component Value Date   CHOL 126 09/19/2016   Lab Results  Component Value Date   HDL 42.20 09/19/2016   Lab Results  Component Value Date   LDLCALC 63 09/19/2016   Lab Results  Component Value Date   TRIG 106.0 09/19/2016   Lab Results  Component Value Date   CHOLHDL 3 09/19/2016   Lab Results  Component Value Date   PSA 0.98 09/19/2016   PSA 0.45  07/29/2015   PSA 4.08 (H) 05/25/2013   Lab Results  Component Value Date   HGBA1C 6.5 12/19/2016    IMPRESSION AND PLAN:  1) DM 2: control worse since getting off actos 30mg . Will restart actos 15mg  qd. Dr. Mercy Moore did his urine prot/cr ratio today. Check Hba1c today. I referred pt to optometry, Dr. Nicki Reaper, for eye exam.  2) HTN: The current medical regimen is effective;  continue present plan and medications. Lytes/cr today by Dr. Mercy Moore.  3) CRI stage III: renal panel by Dr. Mercy Moore today. Avoid NSAIDs.  Focus on good hydration.  4) Allergic rhinitis with PND cough. Start allegra 60mg  bid prn.  An After Visit Summary was printed and given to the patient.  FOLLOW UP: Return in about 3 months (around 06/22/2017) for routine chronic illness f/u.  Signed:  Crissie Sickles, MD           03/22/2017  Addendum 03/28/17: reviewed Dr. Etheleen Nicks most recent office note and labs from 03/22/17: Cr stable at 44 ml/min.  He restarted pt's cozaar 100mg  and d/c'd his amlodipine (at last f/u with me in March I had d/c'd his cozaar and kept him on his amlodipine).  Signed:  Crissie Sickles, MD           03/28/2017

## 2017-03-23 LAB — HEPATITIS C ANTIBODY: HCV AB: NEGATIVE

## 2017-03-25 ENCOUNTER — Other Ambulatory Visit: Payer: Self-pay | Admitting: *Deleted

## 2017-03-25 MED ORDER — PIOGLITAZONE HCL 15 MG PO TABS: 15.0000 mg | ORAL_TABLET | Freq: Every day | ORAL | 1 refills | Status: DC

## 2017-03-25 NOTE — Telephone Encounter (Signed)
Fax from pharmacy, stating that pt is requesting 90 day supply.   Rx was sent on 03/22/17. Will resend for #90 w/ 1RF.

## 2017-03-28 ENCOUNTER — Encounter: Payer: Self-pay | Admitting: Family Medicine

## 2017-03-28 MED ORDER — LOSARTAN POTASSIUM 100 MG PO TABS: 100.0000 mg | ORAL_TABLET | Freq: Every day | ORAL | 3 refills | Status: DC

## 2017-03-28 NOTE — Addendum Note (Signed)
Addended by: Tammi Sou on: 03/28/2017 06:06 PM   Modules accepted: Orders

## 2017-04-08 ENCOUNTER — Other Ambulatory Visit: Payer: Self-pay | Admitting: Family Medicine

## 2017-04-08 NOTE — Telephone Encounter (Signed)
[place].  RF request for furosemide LOV: 03/22/17 Next ov: 06/19/17 Last written: 10/08/16 #90 w/ 1RF

## 2017-04-19 ENCOUNTER — Other Ambulatory Visit: Payer: Self-pay | Admitting: Family Medicine

## 2017-05-03 ENCOUNTER — Other Ambulatory Visit: Payer: Self-pay | Admitting: Family Medicine

## 2017-06-07 ENCOUNTER — Other Ambulatory Visit: Payer: Self-pay | Admitting: Family Medicine

## 2017-06-07 MED ORDER — LOSARTAN POTASSIUM 100 MG PO TABS: 100.0000 mg | ORAL_TABLET | Freq: Every day | ORAL | 3 refills | Status: DC

## 2017-06-19 ENCOUNTER — Ambulatory Visit: Payer: Medicare Other | Admitting: Family Medicine

## 2017-06-21 ENCOUNTER — Encounter: Payer: Self-pay | Admitting: Family Medicine

## 2017-06-21 ENCOUNTER — Ambulatory Visit (INDEPENDENT_AMBULATORY_CARE_PROVIDER_SITE_OTHER): Payer: Medicare Other | Admitting: Family Medicine

## 2017-06-21 VITALS — BP 118/70 | HR 68 | Temp 97.7°F | Resp 16 | Ht 65.0 in | Wt 166.0 lb

## 2017-06-21 DIAGNOSIS — I10 Essential (primary) hypertension: Secondary | ICD-10-CM | POA: Diagnosis not present

## 2017-06-21 DIAGNOSIS — Z23 Encounter for immunization: Secondary | ICD-10-CM

## 2017-06-21 DIAGNOSIS — N183 Chronic kidney disease, stage 3 unspecified: Secondary | ICD-10-CM

## 2017-06-21 DIAGNOSIS — E1140 Type 2 diabetes mellitus with diabetic neuropathy, unspecified: Secondary | ICD-10-CM | POA: Diagnosis not present

## 2017-06-21 LAB — BASIC METABOLIC PANEL
BUN: 15 mg/dL (ref 6–23)
CHLORIDE: 96 meq/L (ref 96–112)
CO2: 29 meq/L (ref 19–32)
Calcium: 9.5 mg/dL (ref 8.4–10.5)
Creatinine, Ser: 1.53 mg/dL — ABNORMAL HIGH (ref 0.40–1.50)
GFR: 48.11 mL/min — ABNORMAL LOW (ref >60.00)
Glucose, Bld: 130 mg/dL — ABNORMAL HIGH (ref 70–99)
Potassium: 4.2 mEq/L (ref 3.5–5.1)
SODIUM: 131 meq/L — AB (ref 135–145)

## 2017-06-21 LAB — HEMOGLOBIN A1C: HEMOGLOBIN A1C: 6.6 % — AB (ref 4.6–6.5)

## 2017-06-21 MED ORDER — AMLODIPINE BESYLATE 5 MG PO TABS: 5.0000 mg | ORAL_TABLET | Freq: Every day | ORAL | 3 refills | Status: DC

## 2017-06-21 MED ORDER — LOSARTAN POTASSIUM 50 MG PO TABS: 50.0000 mg | ORAL_TABLET | Freq: Every day | ORAL | 3 refills | Status: DC

## 2017-06-21 NOTE — Progress Notes (Signed)
OFFICE VISIT  06/21/2017   CC:  Chief Complaint  Patient presents with  . Follow-up    RCI, pt is not fasting.      HPI:    Patient is a 71 y.o. Caucasian male who presents for 3 mo f/u DM 2, HTN, HLD, CRI III. Most recent neph f/u was 03/22/17.  DM: At his last visit here I restarted him on actos 15mg  qd.  However, he simply resumed the 30mg  tabs he had previously. He took this about 1 week and d/c'd it b/c he felt like it was dropping his glucose.  He can't recall any numbers really.   HTN: bp always <130 over 70s.  HR 60s. Taking 5mg  amlodipine and 50mg  losartan daily---pt states that this is exactly what Dr. Mercy Moore recommended.   ROS: no CP, no sOB, no LE or abd swelling.  No dizziness, no HAs.  Past Medical History:  Diagnosis Date  . Chronic prostatitis    Very mild elevation of PSA (>4), referred to Urol where PSA repeat was 1.0 on 06/16/13.  Annual PSA repeat is all that is needed now per urologist.  Most recent 07/2015 was 0.45.  Marland Kitchen Chronic renal insufficiency, stage III (moderate)    Baseline GFR as of 03/2017 = 40s.    . Diabetes mellitus with complication ([place]) Dx'd fall 2011  . HTN (hypertension)    Renal/aortic doppler u/s normal 06/2010  . Hypercalcemia   . Hyperlipidemia 2016   Statin intolerant.  Pt refuses any further trial of statin as of 2018.  . Nonspecific abnormal electrocardiogram (ECG) (EKG) Fall 2011   TWI in inferior leads: myocardial perfusion scan neg and echo normal 07/2010  . Tobacco dependence   . UTI (lower urinary tract infection)    Feb 2012 (klebsiella--dx at [facility]); 03/2013 e coli.     Past Surgical History:  Procedure Laterality Date  . CARDIOVASCULAR STRESS TEST  07/2010   Myocardial perfusion scan neg/low risk.  Marland Kitchen CATARACT EXTRACTION  2007, 2008   Bilateral ([facility] on [address].  . COLONOSCOPY  12/20/04;12/2014   2016 no polyps: recall 5 yrs due to [place] of colon ca  . ESOPHAGOGASTRODUODENOSCOPY  11/2004    esoph stricture/dilation  . TRANSTHORACIC ECHOCARDIOGRAM  07/2010   EF =>55%; LA mildly dilated; trace MR/TR;     Outpatient Medications Prior to Visit  Medication Sig Dispense Refill  . aspirin EC 81 MG tablet Take 81 mg by mouth daily.    . carvedilol (COREG) 6.25 MG tablet TAKE ONE-HALF TABLET BY MOUTH IN THE MORNING, THEN TAKE ONE IN THE EVENING DAILY 45 tablet 11  . esomeprazole (NEXIUM) 20 MG capsule Take 20 mg by mouth daily at 12 noon.    . fexofenadine (ALLEGRA) 60 MG tablet 1 tab po bid for allergy symptoms 60 tablet 1  . furosemide (LASIX) 40 MG tablet TAKE ONE TABLET BY MOUTH ONCE DAILY 90 tablet 1  . GLIPIZIDE XL 5 MG 24 hr tablet TAKE ONE TABLET BY MOUTH ONCE DAILY 90 tablet 3  . sildenafil (REVATIO) 20 MG tablet 5 tabs po qd prn 50 tablet 6  . losartan (COZAAR) 100 MG tablet Take 1 tablet (100 mg total) by mouth daily. 90 tablet 3  . pioglitazone (ACTOS) 15 MG tablet Take 1 tablet (15 mg total) by mouth daily. (Patient not taking: Reported on 06/21/2017) 90 tablet 1   No facility-administered medications prior to visit.     Allergies  Allergen Reactions  . Bactrim [Sulfamethoxazole-Trimethoprim]  Nausea Only    ROS As per HPI  PE: Blood pressure 118/70, pulse 68, temperature 97.7 F (36.5 C), temperature source Oral, resp. rate 16, height 5\' 5"  (1.651 m), weight 166 lb (75.3 kg), SpO2 98 %. Gen: Alert, well appearing.  Patient is oriented to person, place, time, and situation. AFFECT: pleasant, lucid thought and speech. Foot exam - bilateral normal; no swelling/edema, tenderness or skin or vascular lesions. Color and temperature is normal. Sensation is intact. Peripheral pulses are palpable. Toenails are normal.   LABS:  No results found for: TSH Lab Results  Component Value Date   WBC 6.4 11/28/2015   HGB 14.6 11/28/2015   HCT 43.1 11/28/2015   MCV 90.0 11/28/2015   PLT 280.0 11/28/2015   Lab Results  Component Value Date   CREATININE 1.55 (H) 12/19/2016    BUN 11 12/19/2016   NA 136 12/19/2016   K 4.1 12/19/2016   CL 100 12/19/2016   CO2 30 12/19/2016   Lab Results  Component Value Date   ALT 9 12/19/2016   AST 12 12/19/2016   ALKPHOS 113 12/19/2016   BILITOT 0.4 12/19/2016   Lab Results  Component Value Date   CHOL 126 09/19/2016   Lab Results  Component Value Date   HDL 42.20 09/19/2016   Lab Results  Component Value Date   LDLCALC 63 09/19/2016   Lab Results  Component Value Date   TRIG 106.0 09/19/2016   Lab Results  Component Value Date   CHOLHDL 3 09/19/2016   Lab Results  Component Value Date   PSA 0.98 09/19/2016   PSA 0.45 07/29/2015   PSA 4.08 (H) 05/25/2013   Lab Results  Component Value Date   HGBA1C 7.5 (H) 03/22/2017    IMPRESSION AND PLAN:  1) DM 2; historically pretty well controlled. Some med confusion since last o/v, but we straightened it out today. He'll continue glipizide xl 5mg  qd and actos 15 mg once daily. Feet exam normal today. HbA1c today.  2) HTN: well controlled.  Continue 50mg  losartan qd and 5 mg amlodipine qd. Continue periodic home bp monitoring.  3) CRI stage III: avoids NSAIDs, pays close attention to hydration. BMET check today. He will continue f/u with nephrology, but he says Dr. Mercy Moore is retiring, so they will be assigning him to a different nephrologist soon.  An After Visit Summary was printed and given to the patient.  FOLLOW UP: Return in about 3 months (around 09/20/2017) for routine chronic illness f/u.  Signed:  Crissie Sickles, MD           06/21/2017

## 2017-08-16 ENCOUNTER — Encounter: Payer: Self-pay | Admitting: Family Medicine

## 2017-08-16 LAB — HM DIABETES EYE EXAM

## 2017-08-19 DIAGNOSIS — H5213 Myopia, bilateral: Secondary | ICD-10-CM | POA: Diagnosis not present

## 2017-09-19 ENCOUNTER — Encounter: Payer: Self-pay | Admitting: Family Medicine

## 2017-09-19 ENCOUNTER — Other Ambulatory Visit: Payer: Self-pay

## 2017-09-19 ENCOUNTER — Ambulatory Visit: Payer: Medicare Other | Admitting: Family Medicine

## 2017-09-19 VITALS — BP 114/66 | HR 59 | Temp 97.5°F | Resp 16 | Ht 65.0 in | Wt 170.5 lb

## 2017-09-19 DIAGNOSIS — E7800 Pure hypercholesterolemia, unspecified: Secondary | ICD-10-CM | POA: Diagnosis not present

## 2017-09-19 DIAGNOSIS — I10 Essential (primary) hypertension: Secondary | ICD-10-CM | POA: Diagnosis not present

## 2017-09-19 DIAGNOSIS — E118 Type 2 diabetes mellitus with unspecified complications: Secondary | ICD-10-CM | POA: Diagnosis not present

## 2017-09-19 DIAGNOSIS — Z789 Other specified health status: Secondary | ICD-10-CM

## 2017-09-19 DIAGNOSIS — N183 Chronic kidney disease, stage 3 unspecified: Secondary | ICD-10-CM

## 2017-09-19 LAB — BASIC METABOLIC PANEL
BUN: 15 mg/dL (ref 6–23)
CALCIUM: 9 mg/dL (ref 8.4–10.5)
CO2: 30 meq/L (ref 19–32)
CREATININE: 1.43 mg/dL (ref 0.40–1.50)
Chloride: 96 mEq/L (ref 96–112)
GFR: 51.97 mL/min — ABNORMAL LOW (ref >60.00)
GLUCOSE: 99 mg/dL (ref 70–99)
Potassium: 3.8 mEq/L (ref 3.5–5.1)
Sodium: 132 mEq/L — ABNORMAL LOW (ref 135–145)

## 2017-09-19 LAB — HEMOGLOBIN A1C: Hgb A1c MFr Bld: 6.5 % (ref 4.6–6.5)

## 2017-09-19 NOTE — Progress Notes (Signed)
OFFICE VISIT  09/19/2017   CC:  Chief Complaint  Patient presents with  . Follow-up    RCI, pt is not fasting.    HPI:    Patient is a 71 y.o. Caucasian male who presents for f/u HTN, DM 2, CRI stage III.  DM: fasting glucoses avg 90s.  No hypoglycemia.  HTN: checks 3 times/week: 115/60s avg, HR low 60s.  HLD: unable to tolerate statins.  CRI: staying well hydrated.  Avoids NSAIDs.  ROS: no fatigue, fevers, SOB, CP, melena, hematochezia, LE swelling, HAs, dizziness, or focal weakness.  Past Medical History:  Diagnosis Date  . Chronic prostatitis    Very mild elevation of PSA (>4), referred to Urol where PSA repeat was 1.0 on 06/16/13.  Annual PSA repeat is all that is needed now per urologist.  Most recent 07/2015 was 0.45.  Marland Kitchen Chronic renal insufficiency, stage III (moderate) (HCC)    Baseline GFR as of 03/2017 = 40s.    . Diabetes mellitus with complication ([place]) Dx'd fall 2011  . HTN (hypertension)    Renal/aortic doppler u/s normal 06/2010  . Hypercalcemia   . Hyperlipidemia 2016   Statin intolerant.  Pt refuses any further trial of statin as of 2018.  . Nonspecific abnormal electrocardiogram (ECG) (EKG) Fall 2011   TWI in inferior leads: myocardial perfusion scan neg and echo normal 07/2010  . Tobacco dependence   . UTI (lower urinary tract infection)    Feb 2012 (klebsiella--dx at [facility]); 03/2013 e coli.     Past Surgical History:  Procedure Laterality Date  . CARDIOVASCULAR STRESS TEST  07/2010   Myocardial perfusion scan neg/low risk.  Marland Kitchen CATARACT EXTRACTION  2007, 2008   Bilateral ([facility] on [address].  . COLONOSCOPY  12/20/04;12/2014   2016 no polyps: recall 5 yrs due to [place] of colon ca  . ESOPHAGOGASTRODUODENOSCOPY  11/2004   esoph stricture/dilation  . TRANSTHORACIC ECHOCARDIOGRAM  07/2010   EF =>55%; LA mildly dilated; trace MR/TR;     Outpatient Medications Prior to Visit  Medication Sig Dispense Refill  . amLODipine (NORVASC)  5 MG tablet Take 1 tablet (5 mg total) by mouth daily. 90 tablet 3  . aspirin EC 81 MG tablet Take 81 mg by mouth daily.    . carvedilol (COREG) 6.25 MG tablet TAKE ONE-HALF TABLET BY MOUTH IN THE MORNING, THEN TAKE ONE IN THE EVENING DAILY 45 tablet 11  . esomeprazole (NEXIUM) 20 MG capsule Take 20 mg by mouth daily at 12 noon.    . fexofenadine (ALLEGRA) 60 MG tablet 1 tab po bid for allergy symptoms 60 tablet 1  . furosemide (LASIX) 40 MG tablet TAKE ONE TABLET BY MOUTH ONCE DAILY 90 tablet 1  . GLIPIZIDE XL 5 MG 24 hr tablet TAKE ONE TABLET BY MOUTH ONCE DAILY 90 tablet 3  . losartan (COZAAR) 50 MG tablet Take 1 tablet (50 mg total) by mouth daily. 90 tablet 3  . pioglitazone (ACTOS) 15 MG tablet Take 1 tablet (15 mg total) by mouth daily. 90 tablet 1  . sildenafil (REVATIO) 20 MG tablet 5 tabs po qd prn (Patient not taking: Reported on 09/19/2017) 50 tablet 6   No facility-administered medications prior to visit.     Allergies  Allergen Reactions  . Bactrim [Sulfamethoxazole-Trimethoprim] Nausea Only    ROS As per HPI  PE: Blood pressure 114/66, pulse (!) 59, temperature (!) 97.5 F (36.4 C), temperature source Oral, resp. rate 16, height 5\' 5"  (1.651 m), weight  170 lb 8 oz (77.3 kg), SpO2 99 %. Body mass index is 28.37 kg/m.  Gen: Alert, well appearing.  Patient is oriented to person, place, time, and situation. AFFECT: pleasant, lucid thought and speech. CV: RRR, no m/r/g.   LUNGS: CTA bilat, nonlabored resps, good aeration in all lung fields. EXT: no clubbing, cyanosis, or edema.    LABS:  No results found for: TSH Lab Results  Component Value Date   WBC 6.4 11/28/2015   HGB 14.6 11/28/2015   HCT 43.1 11/28/2015   MCV 90.0 11/28/2015   PLT 280.0 11/28/2015   Lab Results  Component Value Date   CREATININE 1.53 (H) 06/21/2017   BUN 15 06/21/2017   NA 131 (L) 06/21/2017   K 4.2 06/21/2017   CL 96 06/21/2017   CO2 29 06/21/2017   Lab Results  Component Value  Date   ALT 9 12/19/2016   AST 12 12/19/2016   ALKPHOS 113 12/19/2016   BILITOT 0.4 12/19/2016   Lab Results  Component Value Date   CHOL 126 09/19/2016   Lab Results  Component Value Date   HDL 42.20 09/19/2016   Lab Results  Component Value Date   LDLCALC 63 09/19/2016   Lab Results  Component Value Date   TRIG 106.0 09/19/2016   Lab Results  Component Value Date   CHOLHDL 3 09/19/2016   Lab Results  Component Value Date   PSA 0.98 09/19/2016   PSA 0.45 07/29/2015   PSA 4.08 (H) 05/25/2013   Lab Results  Component Value Date   HGBA1C 6.6 (H) 06/21/2017    IMPRESSION AND PLAN:  1) DM 2: good control historically. Continue glipizide xl 5mg  qam and pioglitazone 15mg  qd. HbA1c today.  2) HTN: The current medical regimen is effective;  continue present plan and medications. BMET today.  3) CRI stage III: hydrates well, avoids NSAIDs. Check lytes/cr today. He'll be getting assigned to a new nephrologist in the next 1-2 mo since Dr. Mercy Moore has retired.  4) Hyperlipidemia: pt intolerant of statin meds and declines any further trial of ANY cholesterol medication. FLP 08/2016 excellent.  We'll recheck this at next f/u (CPE) in 3 mo ---he is not fasting today.  An After Visit Summary was printed and given to the patient.  Signed:  Crissie Sickles, MD           09/19/2017  FOLLOW UP: Return in about 3 months (around 12/18/2017) for annual CPE (fasting).

## 2017-10-04 ENCOUNTER — Other Ambulatory Visit: Payer: Self-pay | Admitting: Family Medicine

## 2017-11-11 ENCOUNTER — Other Ambulatory Visit: Payer: Self-pay | Admitting: Family Medicine

## 2017-11-14 DIAGNOSIS — E0822 Diabetes mellitus due to underlying condition with diabetic chronic kidney disease: Secondary | ICD-10-CM | POA: Diagnosis not present

## 2017-11-14 DIAGNOSIS — E669 Obesity, unspecified: Secondary | ICD-10-CM | POA: Diagnosis not present

## 2017-11-14 DIAGNOSIS — N183 Chronic kidney disease, stage 3 (moderate): Secondary | ICD-10-CM | POA: Diagnosis not present

## 2017-11-14 DIAGNOSIS — I129 Hypertensive chronic kidney disease with stage 1 through stage 4 chronic kidney disease, or unspecified chronic kidney disease: Secondary | ICD-10-CM | POA: Diagnosis not present

## 2017-11-15 ENCOUNTER — Encounter: Payer: Self-pay | Admitting: Family Medicine

## 2017-12-10 ENCOUNTER — Other Ambulatory Visit: Payer: Self-pay | Admitting: Family Medicine

## 2017-12-19 ENCOUNTER — Ambulatory Visit (INDEPENDENT_AMBULATORY_CARE_PROVIDER_SITE_OTHER): Payer: Medicare Other | Admitting: Family Medicine

## 2017-12-19 ENCOUNTER — Encounter: Payer: Self-pay | Admitting: Family Medicine

## 2017-12-19 ENCOUNTER — Telehealth: Payer: Self-pay | Admitting: Family Medicine

## 2017-12-19 VITALS — BP 129/78 | HR 59 | Temp 97.9°F | Resp 16 | Ht 65.0 in | Wt 171.0 lb

## 2017-12-19 DIAGNOSIS — N183 Chronic kidney disease, stage 3 unspecified: Secondary | ICD-10-CM

## 2017-12-19 DIAGNOSIS — E118 Type 2 diabetes mellitus with unspecified complications: Secondary | ICD-10-CM | POA: Diagnosis not present

## 2017-12-19 DIAGNOSIS — I10 Essential (primary) hypertension: Secondary | ICD-10-CM

## 2017-12-19 DIAGNOSIS — Z125 Encounter for screening for malignant neoplasm of prostate: Secondary | ICD-10-CM

## 2017-12-19 DIAGNOSIS — Z0000 Encounter for general adult medical examination without abnormal findings: Secondary | ICD-10-CM | POA: Diagnosis not present

## 2017-12-19 DIAGNOSIS — E7800 Pure hypercholesterolemia, unspecified: Secondary | ICD-10-CM

## 2017-12-19 DIAGNOSIS — D229 Melanocytic nevi, unspecified: Secondary | ICD-10-CM | POA: Diagnosis not present

## 2017-12-19 LAB — HEMOGLOBIN A1C: Hgb A1c MFr Bld: 6.7 % — ABNORMAL HIGH (ref 4.6–6.5)

## 2017-12-19 LAB — CBC WITH DIFFERENTIAL/PLATELET
BASOS PCT: 0.9 % (ref 0.0–3.0)
Basophils Absolute: 0.1 10*3/uL (ref 0.0–0.1)
Eosinophils Absolute: 0.3 10*3/uL (ref 0.0–0.7)
Eosinophils Relative: 4.1 % (ref 0.0–5.0)
HEMATOCRIT: 46 % (ref 39.0–52.0)
Hemoglobin: 15.7 g/dL (ref 13.0–17.0)
LYMPHS ABS: 2.6 10*3/uL (ref 0.7–4.0)
LYMPHS PCT: 39.4 % (ref 12.0–46.0)
MCHC: 34 g/dL (ref 30.0–36.0)
MCV: 90.6 fl (ref 78.0–100.0)
MONOS PCT: 8.5 % (ref 3.0–12.0)
Monocytes Absolute: 0.6 10*3/uL (ref 0.1–1.0)
Neutro Abs: 3.2 10*3/uL (ref 1.4–7.7)
Neutrophils Relative %: 47.1 % (ref 43.0–77.0)
Platelets: 312 10*3/uL (ref 150.0–400.0)
RBC: 5.08 Mil/uL (ref 4.22–5.81)
RDW: 15 % (ref 11.5–15.5)
WBC: 6.7 10*3/uL (ref 4.0–10.5)

## 2017-12-19 LAB — PSA: PSA: 0.85 ng/mL (ref 0.10–4.00)

## 2017-12-19 MED ORDER — IRBESARTAN 75 MG PO TABS: 75.0000 mg | ORAL_TABLET | Freq: Every day | ORAL | 3 refills | Status: DC

## 2017-12-19 MED ORDER — TELMISARTAN 20 MG PO TABS: 20.0000 mg | ORAL_TABLET | Freq: Every day | ORAL | 6 refills | Status: DC

## 2017-12-19 NOTE — Telephone Encounter (Signed)
Copied from [place]. Topic: Quick Communication - Rx Refill/Question >> Dec 19, 2017  1:14 PM Scherrie Gerlach wrote: Medication: telmisartan (MICARDIS) 20 MG tablet Antonio Serrano with [employer] calling to advise they are not able to approve this medication at this time.  Pt has tried only one alternative. Needs to try another, here are choices: Irbesartan, olmestran, valsartan  [phone] option 5

## 2017-12-19 NOTE — Progress Notes (Signed)
Office Note 12/19/2017  CC:  Chief Complaint  Patient presents with  . Annual Exam    HPI:  Antonio Serrano is a 72 y.o. White male who is here for annual health maintenance exam. His losartan has been recalled so we need to substitute a different ARB--will do low dose telmisartan. Has not taken losartan in last 4d, home bps still 120s syst.  Exercise: walking around in daily activities, otherwise no formal activity. DIET: pretty healthy. Tobacco: approx 1 pack per week.   Past Medical History:  Diagnosis Date  . Chronic prostatitis    Very mild elevation of PSA (>4), referred to Urol where PSA repeat was 1.0 on 06/16/13.  Annual PSA repeat is all that is needed now per urologist.  Most recent 07/2015 was 0.45.  Marland Kitchen Chronic renal insufficiency, stage III (moderate) (HCC)    Baseline GFR as of 2019= 50s.    . Diabetes mellitus with complication ([place]) Dx'd fall 2011  . HTN (hypertension)    Renal/aortic doppler u/s normal 06/2010  . Hypercalcemia   . Hyperlipidemia 2016   Statin intolerant.  Pt refuses any further trial of statin as of 2018.  . Nonspecific abnormal electrocardiogram (ECG) (EKG) Fall 2011   TWI in inferior leads: myocardial perfusion scan neg and echo normal 07/2010  . Tobacco dependence   . UTI (lower urinary tract infection)    Feb 2012 (klebsiella--dx at [facility]); 03/2013 e coli.     Past Surgical History:  Procedure Laterality Date  . CARDIOVASCULAR STRESS TEST  07/2010   Myocardial perfusion scan neg/low risk.  Marland Kitchen CATARACT EXTRACTION  2007, 2008   Bilateral ([facility] on [address].  . COLONOSCOPY  12/20/04;12/2014   2016 no polyps: recall 5 yrs due to [place] of colon ca  . ESOPHAGOGASTRODUODENOSCOPY  11/2004   esoph stricture/dilation  . TRANSTHORACIC ECHOCARDIOGRAM  07/2010   EF =>55%; LA mildly dilated; trace MR/TR;     Family History  Problem Relation Age of Onset  . Dementia Mother   . Pneumonia Father        died in 37's of  pneumonia, was otherwise healthy  . Cancer Sister        colon cancer.  Died age 63  . Diabetes Paternal Aunt   . Colon cancer Neg Hx     Social History   Socioeconomic History  . Marital status: Married    Spouse name: Not on file  . Number of children: Not on file  . Years of education: Not on file  . Highest education level: Not on file  Occupational History  . Not on file  Social Needs  . Financial resource strain: Not on file  . Food insecurity:    Worry: Not on file    Inability: Not on file  . Transportation needs:    Medical: Not on file    Non-medical: Not on file  Tobacco Use  . Smoking status: Current Every Day Smoker    Packs/day: 0.50    Years: 40.00    Pack years: 20.00    Types: Cigarettes  . Smokeless tobacco: Never Used  Substance and Sexual Activity  . Alcohol use: Yes    Alcohol/week: 0.0 oz    Comment: rare alcohol  . Drug use: No  . Sexual activity: Not on file  Lifestyle  . Physical activity:    Days per week: Not on file    Minutes per session: Not on file  . Stress: Not on file  Relationships  . Social connections:    Talks on phone: Not on file    Gets together: Not on file    Attends religious service: Not on file    Active member of club or organization: Not on file    Attends meetings of clubs or organizations: Not on file    Relationship status: Not on file  . Intimate partner violence:    Fear of current or ex partner: Not on file    Emotionally abused: Not on file    Physically abused: Not on file    Forced sexual activity: Not on file  Other Topics Concern  . Not on file  Social History Narrative   Married, 4 grown children, 6 grandchildren.   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   Lives in [place].   25 pack-yr tobacco hx--current as of 05/2014.   Drinks about a six pack per week.  Enjoys golf--six handicap at one point.  No formal exercise.   Walks a lot for  his job.          Outpatient Medications Prior to Visit  Medication Sig Dispense Refill  . amLODipine (NORVASC) 5 MG tablet Take 1 tablet (5 mg total) by mouth daily. 90 tablet 3  . aspirin EC 81 MG tablet Take 81 mg by mouth daily.    . carvedilol (COREG) 6.25 MG tablet TAKE 1/2 (ONE-HALF) TABLET BY MOUTH IN THE MORNING AND 1 TABLET IN THE EVENING 45 tablet 5  . esomeprazole (NEXIUM) 20 MG capsule Take 20 mg by mouth daily at 12 noon.    . fexofenadine (ALLEGRA) 60 MG tablet 1 tab po bid for allergy symptoms 60 tablet 1  . furosemide (LASIX) 40 MG tablet TAKE 1 TABLET BY MOUTH ONCE DAILY 90 tablet 0  . GLIPIZIDE XL 5 MG 24 hr tablet TAKE ONE TABLET BY MOUTH ONCE DAILY 90 tablet 3  . pioglitazone (ACTOS) 15 MG tablet TAKE 1 TABLET BY MOUTH ONCE DAILY 90 tablet 1  . losartan (COZAAR) 50 MG tablet Take 1 tablet (50 mg total) by mouth daily. (Patient not taking: Reported on 12/19/2017) 90 tablet 3   No facility-administered medications prior to visit.     Allergies  Allergen Reactions  . Bactrim [Sulfamethoxazole-Trimethoprim] Nausea Only    ROS Review of Systems  Constitutional: Negative for appetite change, chills, fatigue and fever.  HENT: Negative for congestion, dental problem, ear pain and sore throat.   Eyes: Negative for discharge, redness and visual disturbance.  Respiratory: Negative for cough, chest tightness, shortness of breath and wheezing.   Cardiovascular: Negative for chest pain, palpitations and leg swelling.  Gastrointestinal: Negative for abdominal pain, blood in stool, diarrhea, nausea and vomiting.  Genitourinary: Negative for difficulty urinating, dysuria, flank pain, frequency, hematuria and urgency.  Musculoskeletal: Negative for arthralgias, back pain, joint swelling, myalgias and neck stiffness.  Skin: Negative for pallor and rash.  Neurological: Negative for dizziness, speech difficulty, weakness and headaches.  Hematological: Negative for adenopathy. Does  not bruise/bleed easily.  Psychiatric/Behavioral: Negative for confusion and sleep disturbance. The patient is not nervous/anxious.     PE; Blood pressure 129/78, pulse (!) 59, temperature 97.9 F (36.6 C), temperature source Oral, resp. rate 16, height 5\' 5"  (1.651 m), weight 171 lb (77.6 kg), SpO2 100 %. Body mass index is 28.46 kg/m.  Gen: Alert, well appearing.  Patient is oriented to person, place, time, and situation. AFFECT: pleasant,  lucid thought and speech. ENT: Ears: EACs clear, normal epithelium.  TMs with good light reflex and landmarks bilaterally.  Eyes: no injection, icteris, swelling, or exudate.  EOMI, PERRLA. Nose: no drainage or turbinate edema/swelling.  No injection or focal lesion.  Mouth: lips without lesion/swelling.  Oral mucosa pink and moist.  Dentition intact and without obvious caries or gingival swelling.  Oropharynx without erythema, exudate, or swelling.  Neck: supple/nontender.  No LAD, mass, or TM.  Carotid pulses 2+ bilaterally, without bruits. CV: RRR, no m/r/g.   LUNGS: CTA bilat, nonlabored resps, good aeration in all lung fields. ABD: soft, NT, ND, BS normal.  No hepatospenomegaly or mass.  No bruits. EXT: no clubbing, cyanosis, or edema.  Musculoskeletal: no joint swelling, erythema, warmth, or tenderness.  ROM of all joints intact. Skin - no sores, rashes or color changes. On R mid back he has a small nevus with some mixed dark brown and light brown pigment, borders pretty regular--size is 6 mm craniocaudal axis, 54mm horizontal axis, with a faint oval macule adjacent to this nevus (on the right of it). Rectal exam: negative without mass, lesions or tenderness, PROSTATE EXAM: smooth and symmetric without nodules or tenderness.  Pertinent labs:  No results found for: TSH Lab Results  Component Value Date   WBC 6.4 11/28/2015   HGB 14.6 11/28/2015   HCT 43.1 11/28/2015   MCV 90.0 11/28/2015   PLT 280.0 11/28/2015   Lab Results  Component Value  Date   CREATININE 1.43 09/19/2017   BUN 15 09/19/2017   NA 132 (L) 09/19/2017   K 3.8 09/19/2017   CL 96 09/19/2017   CO2 30 09/19/2017   Lab Results  Component Value Date   ALT 9 12/19/2016   AST 12 12/19/2016   ALKPHOS 113 12/19/2016   BILITOT 0.4 12/19/2016   Lab Results  Component Value Date   CHOL 126 09/19/2016   Lab Results  Component Value Date   HDL 42.20 09/19/2016   Lab Results  Component Value Date   LDLCALC 63 09/19/2016   Lab Results  Component Value Date   TRIG 106.0 09/19/2016   Lab Results  Component Value Date   CHOLHDL 3 09/19/2016   Lab Results  Component Value Date   PSA 0.98 09/19/2016   PSA 0.45 07/29/2015   PSA 4.08 (H) 05/25/2013   Lab Results  Component Value Date   HGBA1C 6.5 09/19/2017    ASSESSMENT AND PLAN:   1) Health maintenance exam: Reviewed age and gender appropriate health maintenance issues (prudent diet, regular exercise, health risks of tobacco and excessive alcohol, use of seatbelts, fire alarms in home, use of sunscreen).  Also reviewed age and gender appropriate health screening as well as vaccine recommendations. Vaccines: all UTD.  Shingrix discussed--he'll think about it. Labs: CBC, CMET, FLP, HbA1c, PSA. Prostate ca screening: DRE , PSA.  Unless PSA concerning this time, this will be his final prostate ca screening. Colon ca screening: next colonoscopy due 12/2019.  2) HTN: BP med recalled: stop losartan and start telmisartan 20mg  qd.  Return to lab in 2 wks for recheck BMET.  3) Atypical nevus: pt prefers watchful waiting/monitoring approach at this time. Skin exam 1 yr ago at derm, no mention of any suspicious lesions on back at that time.   Will recheck this in 27mo, low threshold for excision.  An After Visit Summary was printed and given to the patient.  FOLLOW UP:  Return in about 3 months (around 03/21/2018)  for routine chronic illness f/u--also needs lab visit  2 wks for BMET, also AWV with  KIM.  Signed:  Crissie Sickles, MD           12/19/2017

## 2017-12-19 NOTE — Telephone Encounter (Signed)
Patient notified and verbalized understanding. 

## 2017-12-19 NOTE — Telephone Encounter (Signed)
OK. Irbesartan eRx'd.

## 2017-12-20 LAB — COMPREHENSIVE METABOLIC PANEL
ALBUMIN: 4.5 g/dL (ref 3.5–5.2)
ALK PHOS: 116 U/L (ref 39–117)
ALT: 10 U/L (ref 0–53)
AST: 14 U/L (ref 0–37)
BUN: 15 mg/dL (ref 6–23)
CHLORIDE: 93 meq/L — AB (ref 96–112)
CO2: 24 mEq/L (ref 19–32)
Calcium: 9.8 mg/dL (ref 8.4–10.5)
Creatinine, Ser: 1.62 mg/dL — ABNORMAL HIGH (ref 0.40–1.50)
GFR: 44.97 mL/min — AB (ref >60.00)
Glucose, Bld: 83 mg/dL (ref 70–99)
POTASSIUM: 4.1 meq/L (ref 3.5–5.1)
Sodium: 131 mEq/L — ABNORMAL LOW (ref 135–145)
Total Bilirubin: 0.4 mg/dL (ref 0.2–1.2)
Total Protein: 7.5 g/dL (ref 6.0–8.3)

## 2017-12-20 LAB — LIPID PANEL
CHOLESTEROL: 163 mg/dL (ref 0–200)
HDL: 55.7 mg/dL (ref >39.00)
LDL CALC: 89 mg/dL (ref 0–99)
NONHDL: 106.87
Total CHOL/HDL Ratio: 3
Triglycerides: 88 mg/dL (ref 0.0–149.0)
VLDL: 17.6 mg/dL (ref 0.0–40.0)

## 2017-12-20 MED ORDER — OLMESARTAN MEDOXOMIL 5 MG PO TABS: 5.0000 mg | ORAL_TABLET | Freq: Every day | ORAL | 5 refills | Status: DC

## 2017-12-20 MED ORDER — TELMISARTAN 20 MG PO TABS: 20.0000 mg | ORAL_TABLET | Freq: Every day | ORAL | 6 refills | Status: DC

## 2017-12-20 NOTE — Telephone Encounter (Signed)
I was afraid that may happen. OK, I eRx'd telmisartan 20mg , 1 daily.

## 2017-12-20 NOTE — Telephone Encounter (Signed)
Oops, I already tried telmisartan and insurance would not approve it. I'll send in olmesartan rx.

## 2017-12-20 NOTE — Telephone Encounter (Signed)
Pt advised and voiced understanding.   

## 2017-12-20 NOTE — Telephone Encounter (Signed)
Received fax from [facility] stating that irbesartan is on back order until mid July, they are requesting new medication. Please advise. Thanks.

## 2018-01-06 ENCOUNTER — Other Ambulatory Visit: Payer: Self-pay | Admitting: Family Medicine

## 2018-01-09 NOTE — Progress Notes (Signed)
Subjective:   Antonio Serrano is a 72 y.o. male who presents for Medicare Annual/Subsequent preventive examination.  Review of Systems:  No ROS.  Medicare Wellness Visit. Additional risk factors are reflected in the social history.    Sleep patterns: Sleeps 7-8 hours.  Home Safety/Smoke Alarms: Feels safe in home. Smoke alarms in place.  Living environment; residence and Firearm Safety:  Lives with wife and dog in 1 story home. Seat Belt Safety/Bike Helmet: Wears seat belt.   Male:   CCS-colonoscopy 01/20/2015, diverticulosis. Recall 5 years.      PSA-  Lab Results  Component Value Date   PSA 0.85 12/19/2017   PSA 0.98 09/19/2016   PSA 0.45 07/29/2015       Objective:    Vitals: There were no vitals taken for this visit.  There is no height or weight on file to calculate BMI.  Advanced Directives 09/19/2016 01/20/2015  Does Patient Have a Medical Advance Directive? Yes Yes  Type of Paramedic of [place];Living will Living will  Does patient want to make changes to medical advance directive? No - Patient declined -  Copy of [place] in Chart? No - copy requested No - copy requested    Tobacco Social History   Tobacco Use  Smoking Status Current Every Day Smoker  . Packs/day: 0.50  . Years: 40.00  . Pack years: 20.00  . Types: Cigarettes  Smokeless Tobacco Never Used     Ready to quit: Not Answered Counseling given: Not Answered    Past Medical History:  Diagnosis Date  . Chronic prostatitis    Very mild elevation of PSA (>4), referred to Urol where PSA repeat was 1.0 on 06/16/13.  Annual PSA repeat is all that is needed now per urologist.  Most recent 07/2015 was 0.45.  Marland Kitchen Chronic renal insufficiency, stage III (moderate) (HCC)    Baseline GFR as of 2019= 50s.    . Diabetes mellitus with complication ([place]) Dx'd fall 2011  . HTN (hypertension)    Renal/aortic doppler u/s normal 06/2010  . Hypercalcemia   .  Hyperlipidemia 2016   Statin intolerant.  Pt refuses any further trial of statin as of 2018.  . Nonspecific abnormal electrocardiogram (ECG) (EKG) Fall 2011   TWI in inferior leads: myocardial perfusion scan neg and echo normal 07/2010  . Tobacco dependence   . UTI (lower urinary tract infection)    Feb 2012 (klebsiella--dx at [facility]); 03/2013 e coli.    Past Surgical History:  Procedure Laterality Date  . CARDIOVASCULAR STRESS TEST  07/2010   Myocardial perfusion scan neg/low risk.  Marland Kitchen CATARACT EXTRACTION  2007, 2008   Bilateral ([facility] on [address].  . COLONOSCOPY  12/20/04;12/2014   2016 no polyps: recall 5 yrs due to [place] of colon ca  . ESOPHAGOGASTRODUODENOSCOPY  11/2004   esoph stricture/dilation  . TRANSTHORACIC ECHOCARDIOGRAM  07/2010   EF =>55%; LA mildly dilated; trace MR/TR;    Family History  Problem Relation Age of Onset  . Dementia Mother   . Pneumonia Father        died in 23's of pneumonia, was otherwise healthy  . Cancer Sister        colon cancer.  Died age 73  . Diabetes Paternal Aunt   . Colon cancer Neg Hx    Social History   Socioeconomic History  . Marital status: Married    Spouse name: Not on file  . Number of children: Not  on file  . Years of education: Not on file  . Highest education level: Not on file  Occupational History  . Not on file  Social Needs  . Financial resource strain: Not on file  . Food insecurity:    Worry: Not on file    Inability: Not on file  . Transportation needs:    Medical: Not on file    Non-medical: Not on file  Tobacco Use  . Smoking status: Current Every Day Smoker    Packs/day: 0.50    Years: 40.00    Pack years: 20.00    Types: Cigarettes  . Smokeless tobacco: Never Used  Substance and Sexual Activity  . Alcohol use: Yes    Alcohol/week: 0.0 oz    Comment: rare alcohol  . Drug use: No  . Sexual activity: Not on file  Lifestyle  . Physical activity:    Days per week: Not on file      Minutes per session: Not on file  . Stress: Not on file  Relationships  . Social connections:    Talks on phone: Not on file    Gets together: Not on file    Attends religious service: Not on file    Active member of club or organization: Not on file    Attends meetings of clubs or organizations: Not on file    Relationship status: Not on file  Other Topics Concern  . Not on file  Social History Narrative   Married, 4 grown children, 6 grandchildren.   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   Lives in [place].   25 pack-yr tobacco hx--current as of 05/2014.   Drinks about a six pack per week.  Enjoys golf--six handicap at one point.  No formal exercise.   Walks a lot for his job.          Outpatient Encounter Medications as of 01/10/2018  Medication Sig  . amLODipine (NORVASC) 5 MG tablet Take 1 tablet (5 mg total) by mouth daily.  Marland Kitchen aspirin EC 81 MG tablet Take 81 mg by mouth daily.  . carvedilol (COREG) 6.25 MG tablet TAKE 1/2 (ONE-HALF) TABLET BY MOUTH IN THE MORNING AND 1 TABLET IN THE EVENING  . esomeprazole (NEXIUM) 20 MG capsule Take 20 mg by mouth daily at 12 noon.  . fexofenadine (ALLEGRA) 60 MG tablet 1 tab po bid for allergy symptoms  . furosemide (LASIX) 40 MG tablet TAKE 1 TABLET BY MOUTH ONCE DAILY  . GLIPIZIDE XL 5 MG 24 hr tablet TAKE ONE TABLET BY MOUTH ONCE DAILY  . olmesartan (BENICAR) 5 MG tablet Take 1 tablet (5 mg total) by mouth daily.  . pioglitazone (ACTOS) 15 MG tablet TAKE 1 TABLET BY MOUTH ONCE DAILY   No facility-administered encounter medications on file as of 01/10/2018.     Activities of Daily Living No flowsheet data found.  Patient Care Team: Tammi Sou, MD as PCP - General (Family Medicine) Chuck Hint (Dentistry) Macarthur Critchley, [place] as Consulting Physician (Optometry) Corliss Parish, MD as Consulting Physician (Nephrology)   Assessment:   This is a routine wellness  examination for Antonio Serrano.  Exercise Activities and Dietary recommendations   Diet (meal preparation, eat out, water intake, caffeinated beverages, dairy products, fruits and vegetables): Drinks soda (1 day) and water.   Breakfast: danish, coffee Lunch: sandwich, salad Dinner: protein and vegetables.    Goals    None  Fall Risk Fall Risk  12/19/2017 09/19/2016 07/29/2015  Falls in the past year? No No No    Depression Screen PHQ 2/9 Scores 12/19/2017 09/19/2016 07/29/2015  PHQ - 2 Score 0 0 0  PHQ- 9 Score 0 - -    Cognitive Function        Immunization History  Administered Date(s) Administered  . Influenza, High Dose Seasonal PF 06/21/2017  . Influenza,inj,Quad PF,6+ Mos 05/31/2014, 06/27/2015  . Influenza-Unspecified 09/04/2016  . Pneumococcal Conjugate-13 05/18/2014  . Pneumococcal Polysaccharide-23 07/29/2015     Screening Tests Health Maintenance  Topic Date Due  . INFLUENZA VACCINE  05/01/2018  . FOOT EXAM  06/21/2018  . HEMOGLOBIN A1C  06/21/2018  . OPHTHALMOLOGY EXAM  08/16/2018  . TETANUS/TDAP  10/04/2019  . COLONOSCOPY  01/20/2020  . Hepatitis C Screening  Completed  . PNA vac Low Risk Adult  Completed        Plan:    Bring a copy of your living will and/or healthcare power of attorney to your next office visit.  Continue doing brain stimulating activities (puzzles, reading, adult coloring books, staying active) to keep memory sharp.   Continue to eat heart healthy diet (full of fruits, vegetables, whole grains, lean protein, water--limit salt, fat, and sugar intake) and increase physical activity as tolerated.  I have personally reviewed and noted the following in the patient's chart:   . Medical and social history . Use of alcohol, tobacco or illicit drugs  . Current medications and supplements . Functional ability and status . Nutritional status . Physical activity . Advanced directives . List of other  physicians . Hospitalizations, surgeries, and ER visits in previous 12 months . Vitals . Screenings to include cognitive, depression, and falls . Referrals and appointments  In addition, I have reviewed and discussed with patient certain preventive protocols, quality metrics, and best practice recommendations. A written personalized care plan for preventive services as well as general preventive health recommendations were provided to patient.     Gerilyn Nestle, RN  01/09/2018

## 2018-01-10 ENCOUNTER — Other Ambulatory Visit: Payer: Self-pay

## 2018-01-10 ENCOUNTER — Ambulatory Visit (INDEPENDENT_AMBULATORY_CARE_PROVIDER_SITE_OTHER): Payer: Medicare Other

## 2018-01-10 ENCOUNTER — Other Ambulatory Visit (INDEPENDENT_AMBULATORY_CARE_PROVIDER_SITE_OTHER): Payer: Medicare Other

## 2018-01-10 VITALS — BP 130/70 | HR 64 | Ht 65.0 in | Wt 171.2 lb

## 2018-01-10 DIAGNOSIS — N183 Chronic kidney disease, stage 3 unspecified: Secondary | ICD-10-CM

## 2018-01-10 DIAGNOSIS — Z0000 Encounter for general adult medical examination without abnormal findings: Secondary | ICD-10-CM | POA: Diagnosis not present

## 2018-01-10 NOTE — Patient Instructions (Addendum)
Bring a copy of your living will and/or healthcare power of attorney to your next office visit.  Continue doing brain stimulating activities (puzzles, reading, adult coloring books, staying active) to keep memory sharp.   Continue to eat heart healthy diet (full of fruits, vegetables, whole grains, lean protein, water--limit salt, fat, and sugar intake) and increase physical activity as tolerated.   Health Maintenance, Male A healthy lifestyle and preventive care is important for your health and wellness. Ask your health care provider about what schedule of regular examinations is right for you. What should I know about weight and diet? Eat a Healthy Diet  Eat plenty of vegetables, fruits, whole grains, low-fat dairy products, and lean protein.  Do not eat a lot of foods high in solid fats, added sugars, or salt.  Maintain a Healthy Weight Regular exercise can help you achieve or maintain a healthy weight. You should:  Do at least 150 minutes of exercise each week. The exercise should increase your heart rate and make you sweat (moderate-intensity exercise).  Do strength-training exercises at least twice a week.  Watch Your Levels of Cholesterol and Blood Lipids  Have your blood tested for lipids and cholesterol every 5 years starting at 70 years of age. If you are at high risk for heart disease, you should start having your blood tested when you are 70 years old. You may need to have your cholesterol levels checked more often if: ? Your lipid or cholesterol levels are high. ? You are older than 70 years of age. ? You are at high risk for heart disease.  What should I know about cancer screening? Many types of cancers can be detected early and may often be prevented. Lung Cancer  You should be screened every year for lung cancer if: ? You are a current smoker who has smoked for at least 30 years. ? You are a former smoker who has quit within the past 15 years.  Talk to your  health care provider about your screening options, when you should start screening, and how often you should be screened.  Colorectal Cancer  Routine colorectal cancer screening usually begins at 70 years of age and should be repeated every 5-10 years until you are 70 years old. You may need to be screened more often if early forms of precancerous polyps or small growths are found. Your health care provider may recommend screening at an earlier age if you have risk factors for colon cancer.  Your health care provider may recommend using home test kits to check for hidden blood in the stool.  A small camera at the end of a tube can be used to examine your colon (sigmoidoscopy or colonoscopy). This checks for the earliest forms of colorectal cancer.  Prostate and Testicular Cancer  Depending on your age and overall health, your health care provider may do certain tests to screen for prostate and testicular cancer.  Talk to your health care provider about any symptoms or concerns you have about testicular or prostate cancer.  Skin Cancer  Check your skin from head to toe regularly.  Tell your health care provider about any new moles or changes in moles, especially if: ? There is a change in a mole's size, shape, or color. ? You have a mole that is larger than a pencil eraser.  Always use sunscreen. Apply sunscreen liberally and repeat throughout the day.  Protect yourself by wearing long sleeves, pants, a wide-brimmed hat, and sunglasses when outside.  What should I know about heart disease, diabetes, and high blood pressure?  If you are 74-75 years of age, have your blood pressure checked every 3-5 years. If you are 96 years of age or older, have your blood pressure checked every year. You should have your blood pressure measured twice-once when you are at a hospital or clinic, and once when you are not at a hospital or clinic. Record the average of the two measurements. To check your  blood pressure when you are not at a hospital or clinic, you can use: ? An automated blood pressure machine at a pharmacy. ? A home blood pressure monitor.  Talk to your health care provider about your target blood pressure.  If you are between 5-24 years old, ask your health care provider if you should take aspirin to prevent heart disease.  Have regular diabetes screenings by checking your fasting blood sugar level. ? If you are at a normal weight and have a low risk for diabetes, have this test once every three years after the age of 15. ? If you are overweight and have a high risk for diabetes, consider being tested at a younger age or more often.  A one-time screening for abdominal aortic aneurysm (AAA) by ultrasound is recommended for men aged 28-75 years who are current or former smokers. What should I know about preventing infection? Hepatitis B If you have a higher risk for hepatitis B, you should be screened for this virus. Talk with your health care provider to find out if you are at risk for hepatitis B infection. Hepatitis C Blood testing is recommended for:  Everyone born from [birth year] through 1963.  Anyone with known risk factors for hepatitis C.  Sexually Transmitted Diseases (STDs)  You should be screened each year for STDs including gonorrhea and chlamydia if: ? You are sexually active and are younger than 70 years of age. ? You are older than 70 years of age and your health care provider tells you that you are at risk for this type of infection. ? Your sexual activity has changed since you were last screened and you are at an increased risk for chlamydia or gonorrhea. Ask your health care provider if you are at risk.  Talk with your health care provider about whether you are at high risk of being infected with HIV. Your health care provider may recommend a prescription medicine to help prevent HIV infection.  What else can I do?  Schedule regular health, dental, and  eye exams.  Stay current with your vaccines (immunizations).  Do not use any tobacco products, such as cigarettes, chewing tobacco, and e-cigarettes. If you need help quitting, ask your health care provider.  Limit alcohol intake to no more than 2 drinks per day. One drink equals 12 ounces of beer, 5 ounces of wine, or 1 ounces of hard liquor.  Do not use street drugs.  Do not share needles.  Ask your health care provider for help if you need support or information about quitting drugs.  Tell your health care provider if you often feel depressed.  Tell your health care provider if you have ever been abused or do not feel safe at home. This information is not intended to replace advice given to you by your health care provider. Make sure you discuss any questions you have with your health care provider. Document Released: 03/15/2008 Document Revised: 05/16/2016 Document Reviewed: 06/21/2015 Elsevier Interactive Patient Education  [employer].

## 2018-01-11 LAB — SPECIMEN STATUS

## 2018-01-17 LAB — BASIC METABOLIC PANEL
BUN / CREAT RATIO: 9 — AB (ref 10–24)
BUN: 14 mg/dL (ref 8–27)
CO2: 21 mmol/L (ref 20–29)
Calcium: 9.5 mg/dL (ref 8.6–10.2)
Chloride: 92 mmol/L — ABNORMAL LOW (ref 96–106)
Creatinine, Ser: 1.55 mg/dL — ABNORMAL HIGH (ref 0.76–1.27)
GFR, EST AFRICAN AMERICAN: 52 mL/min/{1.73_m2} — AB (ref >59)
GFR, EST NON AFRICAN AMERICAN: 45 mL/min/{1.73_m2} — AB (ref >59)
Glucose: 180 mg/dL — ABNORMAL HIGH (ref 65–99)
POTASSIUM: 4.9 mmol/L (ref 3.5–5.2)
SODIUM: 135 mmol/L (ref 134–144)

## 2018-01-17 LAB — SPECIMEN STATUS REPORT

## 2018-01-20 NOTE — Progress Notes (Signed)
AWV reviewed and agree. Signed:  Crissie Sickles, MD           01/20/2018

## 2018-03-20 ENCOUNTER — Encounter: Payer: Self-pay | Admitting: Family Medicine

## 2018-03-20 ENCOUNTER — Ambulatory Visit: Payer: Medicare Other | Admitting: Family Medicine

## 2018-03-20 VITALS — BP 91/54 | HR 57 | Temp 98.4°F | Resp 16 | Ht 65.0 in | Wt 167.1 lb

## 2018-03-20 DIAGNOSIS — E663 Overweight: Secondary | ICD-10-CM

## 2018-03-20 DIAGNOSIS — I10 Essential (primary) hypertension: Secondary | ICD-10-CM

## 2018-03-20 DIAGNOSIS — N183 Chronic kidney disease, stage 3 unspecified: Secondary | ICD-10-CM

## 2018-03-20 DIAGNOSIS — I952 Hypotension due to drugs: Secondary | ICD-10-CM

## 2018-03-20 DIAGNOSIS — E118 Type 2 diabetes mellitus with unspecified complications: Secondary | ICD-10-CM

## 2018-03-20 LAB — BASIC METABOLIC PANEL
BUN: 21 mg/dL (ref 6–23)
CALCIUM: 9.6 mg/dL (ref 8.4–10.5)
CO2: 28 meq/L (ref 19–32)
Chloride: 97 mEq/L (ref 96–112)
Creatinine, Ser: 2.08 mg/dL — ABNORMAL HIGH (ref 0.40–1.50)
GFR: 33.68 mL/min — AB (ref >60.00)
Glucose, Bld: 85 mg/dL (ref 70–99)
Potassium: 4.7 mEq/L (ref 3.5–5.1)
SODIUM: 133 meq/L — AB (ref 135–145)

## 2018-03-20 LAB — HEMOGLOBIN A1C: Hgb A1c MFr Bld: 6.7 % — ABNORMAL HIGH (ref 4.6–6.5)

## 2018-03-20 NOTE — Progress Notes (Signed)
OFFICE VISIT  03/20/2018   CC:  Chief Complaint  Patient presents with  . Follow-up    RCI, pt is not fasting.     HPI:    Patient is a 72 y.o. Caucasian male who presents for 3 mo f/u DM 2, HTN, chronic renal insufficiency III. Most recent nephrology clinic o/v record I have is from 11/14/17. Feeling good, playing golf some.  DM 2: fasting glucoses typically 80.  No hypoglycemia in the last 6 mo.  HTN: home bp checks 115/70s, upper 50s to mid 60s.  No CP, no SOB, no LE swelling, no dizziness.  CRI III: avoids NSAIDs.  Drinks lots of water all day.   ROS: No melena, hematochezia, HAs, rash, polyuria, or polydipsia.  No palpitations.  Past Medical History:  Diagnosis Date  . Chronic prostatitis    Very mild elevation of PSA (>4), referred to Urol where PSA repeat was 1.0 on 06/16/13.  Annual PSA repeat is all that is needed now per urologist.  Most recent 07/2015 was 0.45.  Marland Kitchen Chronic renal insufficiency, stage III (moderate) (HCC)    Baseline GFR as of 2019= 50s.    . Diabetes mellitus with complication ([place]) Dx'd fall 2011  . HTN (hypertension)    Renal/aortic doppler u/s normal 06/2010  . Hypercalcemia   . Hyperlipidemia 2016   Statin intolerant.  Pt refuses any further trial of statin as of 2018.  . Nonspecific abnormal electrocardiogram (ECG) (EKG) Fall 2011   TWI in inferior leads: myocardial perfusion scan neg and echo normal 07/2010  . Tobacco dependence   . UTI (lower urinary tract infection)    Feb 2012 (klebsiella--dx at [facility]); 03/2013 e coli.     Past Surgical History:  Procedure Laterality Date  . CARDIOVASCULAR STRESS TEST  07/2010   Myocardial perfusion scan neg/low risk.  Marland Kitchen CATARACT EXTRACTION  2007, 2008   Bilateral ([facility] on [address].  . COLONOSCOPY  12/20/04;12/2014   2016 no polyps: recall 5 yrs due to [place] of colon ca  . ESOPHAGOGASTRODUODENOSCOPY  11/2004   esoph stricture/dilation  . TRANSTHORACIC ECHOCARDIOGRAM  07/2010    EF =>55%; LA mildly dilated; trace MR/TR;     Outpatient Medications Prior to Visit  Medication Sig Dispense Refill  . aspirin EC 81 MG tablet Take 81 mg by mouth daily.    . carvedilol (COREG) 6.25 MG tablet TAKE 1/2 (ONE-HALF) TABLET BY MOUTH IN THE MORNING AND 1 TABLET IN THE EVENING 45 tablet 5  . Dextromethorphan-guaiFENesin ([place] DM PO) Take by mouth.    . esomeprazole (NEXIUM) 20 MG capsule Take 20 mg by mouth daily at 12 noon.    . fexofenadine (ALLEGRA) 60 MG tablet 1 tab po bid for allergy symptoms 60 tablet 1  . furosemide (LASIX) 40 MG tablet TAKE 1 TABLET BY MOUTH ONCE DAILY 90 tablet 1  . GLIPIZIDE XL 5 MG 24 hr tablet TAKE ONE TABLET BY MOUTH ONCE DAILY 90 tablet 3  . olmesartan (BENICAR) 5 MG tablet Take 1 tablet (5 mg total) by mouth daily. 30 tablet 5  . pioglitazone (ACTOS) 15 MG tablet TAKE 1 TABLET BY MOUTH ONCE DAILY 90 tablet 1  . amLODipine (NORVASC) 5 MG tablet Take 1 tablet (5 mg total) by mouth daily. 90 tablet 3   No facility-administered medications prior to visit.     Allergies  Allergen Reactions  . Bactrim [Sulfamethoxazole-Trimethoprim] Nausea Only    ROS As per HPI  PE: Blood pressure (!) 91/54,  pulse (!) 57, temperature 98.4 F (36.9 C), temperature source Oral, resp. rate 16, height 5\' 5"  (1.651 m), weight 167 lb 2 oz (75.8 kg), SpO2 97 %. Gen: Alert, well appearing.  Patient is oriented to person, place, time, and situation. AFFECT: pleasant, lucid thought and speech. CV: RRR, no m/r/g.   LUNGS: CTA bilat, nonlabored resps, good aeration in all lung fields. EXT: no clubbing, cyanosis, or edema.    LABS:  No results found for: TSH Lab Results  Component Value Date   WBC WILL FOLLOW 01/10/2018   HGB WILL FOLLOW 01/10/2018   HCT WILL FOLLOW 01/10/2018   MCV WILL FOLLOW 01/10/2018   PLT WILL FOLLOW 01/10/2018   Lab Results  Component Value Date   CREATININE 1.55 (H) 01/10/2018   BUN 14 01/10/2018   NA 135 01/10/2018   K 4.9  01/10/2018   CL 92 (L) 01/10/2018   CO2 21 01/10/2018   Lab Results  Component Value Date   ALT 10 12/19/2017   AST 14 12/19/2017   ALKPHOS 116 12/19/2017   BILITOT 0.4 12/19/2017   Lab Results  Component Value Date   CHOL 163 12/19/2017   Lab Results  Component Value Date   HDL 55.70 12/19/2017   Lab Results  Component Value Date   LDLCALC 89 12/19/2017   Lab Results  Component Value Date   TRIG 88.0 12/19/2017   Lab Results  Component Value Date   CHOLHDL 3 12/19/2017   Lab Results  Component Value Date   PSA 0.85 12/19/2017   PSA 0.98 09/19/2016   PSA 0.45 07/29/2015   Lab Results  Component Value Date   HGBA1C 6.7 (H) 12/19/2017    IMPRESSION AND PLAN:  1) DM 2, historically well controlled. HbA1c today.  2) HTN: bp a little on the low normal side at home, low bp here today.  Asymptomatic. Will stop amlodipine. Instructions: Stop taking your amlodipine (blood pressure medication). Continue to check your blood pressure and heart rate once a day and write these numbers down-->bring them to next follow up visit to review with me. If blood pressure rises above 140/90 consistently then restart your amlodipine and call our office to let us know.  3) CRI III: avoiding NSAIDs well.  Hydrating well. BMET today. Send lab results to nephrologist, Dr. Moshe Cipro: plan for just an annual visit with nephrology at this time.  An After Visit Summary was printed and given to the patient.  FOLLOW UP: Return in about 3 months (around 06/20/2018) for routine chronic illness f/u.  Signed:  Crissie Sickles, MD           03/20/2018

## 2018-03-20 NOTE — Patient Instructions (Signed)
Stop taking your amlodipine (blood pressure medication). Continue to check your blood pressure and heart rate once a day and write these numbers down-->bring them to next follow up visit to review with me.  If blood pressure rises above 140/90 consistently then restart your amlodipine and call our office to let us know.

## 2018-03-21 ENCOUNTER — Other Ambulatory Visit: Payer: Self-pay | Admitting: *Deleted

## 2018-03-21 DIAGNOSIS — N183 Chronic kidney disease, stage 3 unspecified: Secondary | ICD-10-CM

## 2018-03-27 ENCOUNTER — Other Ambulatory Visit (INDEPENDENT_AMBULATORY_CARE_PROVIDER_SITE_OTHER): Payer: Medicare Other

## 2018-03-27 DIAGNOSIS — N183 Chronic kidney disease, stage 3 unspecified: Secondary | ICD-10-CM

## 2018-03-28 LAB — BASIC METABOLIC PANEL
BUN / CREAT RATIO: 10 (ref 10–24)
BUN: 15 mg/dL (ref 8–27)
CHLORIDE: 96 mmol/L (ref 96–106)
CO2: 22 mmol/L (ref 20–29)
CREATININE: 1.48 mg/dL — AB (ref 0.76–1.27)
Calcium: 9.6 mg/dL (ref 8.6–10.2)
GFR calc Af Amer: 55 mL/min/{1.73_m2} — ABNORMAL LOW (ref >59)
GFR calc non Af Amer: 47 mL/min/{1.73_m2} — ABNORMAL LOW (ref >59)
Glucose: 123 mg/dL — ABNORMAL HIGH (ref 65–99)
Potassium: 4.4 mmol/L (ref 3.5–5.2)
SODIUM: 134 mmol/L (ref 134–144)

## 2018-03-28 LAB — SPECIMEN STATUS

## 2018-03-28 LAB — SPECIMEN STATUS REPORT

## 2018-04-25 ENCOUNTER — Other Ambulatory Visit: Payer: Self-pay | Admitting: Family Medicine

## 2018-05-12 ENCOUNTER — Other Ambulatory Visit: Payer: Self-pay | Admitting: Family Medicine

## 2018-05-19 ENCOUNTER — Other Ambulatory Visit: Payer: Self-pay | Admitting: *Deleted

## 2018-05-19 MED ORDER — OLMESARTAN MEDOXOMIL 5 MG PO TABS: 2.5000 mg | ORAL_TABLET | Freq: Every day | ORAL | 1 refills | Status: DC

## 2018-05-19 NOTE — Telephone Encounter (Signed)
Pt is now taking half tab daily, will update sig and qty.

## 2018-06-13 ENCOUNTER — Other Ambulatory Visit: Payer: Self-pay | Admitting: Family Medicine

## 2018-06-17 DIAGNOSIS — F17200 Nicotine dependence, unspecified, uncomplicated: Secondary | ICD-10-CM | POA: Diagnosis not present

## 2018-06-17 DIAGNOSIS — I129 Hypertensive chronic kidney disease with stage 1 through stage 4 chronic kidney disease, or unspecified chronic kidney disease: Secondary | ICD-10-CM | POA: Diagnosis not present

## 2018-06-17 DIAGNOSIS — E0822 Diabetes mellitus due to underlying condition with diabetic chronic kidney disease: Secondary | ICD-10-CM | POA: Diagnosis not present

## 2018-06-17 DIAGNOSIS — N183 Chronic kidney disease, stage 3 (moderate): Secondary | ICD-10-CM | POA: Diagnosis not present

## 2018-06-20 ENCOUNTER — Ambulatory Visit: Payer: Medicare Other | Admitting: Family Medicine

## 2018-06-20 ENCOUNTER — Encounter: Payer: Self-pay | Admitting: Family Medicine

## 2018-06-20 VITALS — BP 126/68 | HR 55 | Temp 97.7°F | Resp 16 | Ht 65.0 in | Wt 165.5 lb

## 2018-06-20 DIAGNOSIS — I10 Essential (primary) hypertension: Secondary | ICD-10-CM | POA: Diagnosis not present

## 2018-06-20 DIAGNOSIS — Z23 Encounter for immunization: Secondary | ICD-10-CM

## 2018-06-20 DIAGNOSIS — E663 Overweight: Secondary | ICD-10-CM

## 2018-06-20 DIAGNOSIS — E118 Type 2 diabetes mellitus with unspecified complications: Secondary | ICD-10-CM | POA: Diagnosis not present

## 2018-06-20 DIAGNOSIS — N183 Chronic kidney disease, stage 3 unspecified: Secondary | ICD-10-CM

## 2018-06-20 NOTE — Progress Notes (Signed)
OFFICE VISIT  06/20/2018   CC:  Chief Complaint  Patient presents with  . Follow-up    [facility], pt is not fasting.   HPI:    Patient is a 72 y.o. Caucasian male who presents for DM 2, HTN, CRI III. Most recent f/u with nephrologist was 3 days ago-->doing well, no changes, no labs done, pt was told to f/u annually.  Has been feeling well.  Working on cutting back on sodium and potassium and high fat dairy.  HTN: avg home bp 115 over about 70.  HR avg 65-70.  DM 2: checks three times a week, fastings 80-90.  No hypoglycemia. No burning, tingling, or numbness in feet.  No claudication.  CRI: he stays out of the heat.  Hydrating well (64 oz water per day minimum).  ROS: no CP, no SOB, no wheezing, no cough, no dizziness, no HAs, no rashes, no melena/hematochezia.  No polyuria or polydipsia.  No myalgias or arthralgias.   Past Medical History:  Diagnosis Date  . Chronic prostatitis    Very mild elevation of PSA (>4), referred to U[facility] where PSA repeat was 1.0 on 06/16/13.  Annual PSA repeat is all that is needed now per urologist.  Most recent 07/2015 was 0.45.  Marland Kitchen Chronic renal insufficiency, stage III (moderate) (HCC)    Baseline GFR as of 2019= 50s.    . Diabetes mellitus with complication ([place]) Dx'd fall 2011  . HTN (hypertension)    Renal/aortic doppler u/s normal 06/2010  . Hypercalcemia   . Hyperlipidemia 2016   Statin intolerant.  Pt refuses any further trial of statin as of 2018.  . Nonspecific abnormal electrocardiogram (ECG) (EKG) Fall 2011   TWI in inferior leads: myocardial perfusion scan neg and echo normal 07/2010  . Tobacco dependence   . UTI (lower urinary tract infection)    Feb 2012 (klebsiella--dx at [facility]); 03/2013 e coli.     Past Surgical History:  Procedure Laterality Date  . CARDIOVASCULAR STRESS TEST  07/2010   Myocardial perfusion scan neg/low risk.  Marland Kitchen CATARACT EXTRACTION  2007, 2008   Bilateral ([facility] on [address].  .  COLONOSCOPY  12/20/04;12/2014   2016 no polyps: recall 5 yrs due to [place] of colon ca  . ESOPHAGOGASTRODUODENOSCOPY  11/2004   esoph stricture/dilation  . TRANSTHORACIC ECHOCARDIOGRAM  07/2010   EF =>55%; LA mildly dilated; trace MR/TR;     Outpatient Medications Prior to Visit  Medication Sig Dispense Refill  . aspirin EC 81 MG tablet Take 81 mg by mouth daily.    . carvedilol (COREG) 6.25 MG tablet TAKE 1/2 (ONE-HALF) TABLET BY MOUTH IN THE MORNING AND 1 TABLET IN THE EVENING 45 tablet 5  . Dextromethorphan-guaiFENesin ([place] DM PO) Take by mouth.    . esomeprazole (NEXIUM) 20 MG capsule Take 20 mg by mouth daily at 12 noon.    . fexofenadine (ALLEGRA) 60 MG tablet 1 tab po bid for allergy symptoms 60 tablet 1  . furosemide (LASIX) 40 MG tablet TAKE 1 TABLET BY MOUTH ONCE DAILY 90 tablet 1  . GLIPIZIDE XL 5 MG 24 hr tablet TAKE 1 TABLET BY MOUTH ONCE DAILY 90 tablet 1  . olmesartan (BENICAR) 5 MG tablet Take 0.5 tablets (2.5 mg total) by mouth daily. 45 tablet 1  . pioglitazone (ACTOS) 15 MG tablet TAKE 1 TABLET BY MOUTH ONCE DAILY 90 tablet 1   No facility-administered medications prior to visit.     Allergies  Allergen Reactions  .  Bactrim [Sulfamethoxazole-Trimethoprim] Nausea Only    ROS As per HPI  PE: Blood pressure 126/68, pulse (!) 55, temperature 97.7 F (36.5 C), temperature source Oral, resp. rate 16, height 5\' 5"  (1.651 m), weight 165 lb 8 oz (75.1 kg), SpO2 98 %. Gen: Alert, well appearing.  Patient is oriented to person, place, time, and situation. AFFECT: pleasant, lucid thought and speech. CV: RRR, no m/r/g.   LUNGS: CTA bilat, nonlabored resps, good aeration in all lung fields. Foot exam - bilateral normal; no swelling, tenderness or skin or vascular lesions. Color and temperature is normal. Sensation is intact. Peripheral pulses are palpable. Toenails are normal.   LABS:  No results found for: TSH Lab Results  Component Value Date   WBC WILL FOLLOW  03/27/2018   HGB WILL FOLLOW 03/27/2018   HCT WILL FOLLOW 03/27/2018   MCV WILL FOLLOW 03/27/2018   PLT WILL FOLLOW 03/27/2018   Lab Results  Component Value Date   CREATININE 1.48 (H) 03/27/2018   BUN 15 03/27/2018   NA 134 03/27/2018   K 4.4 03/27/2018   CL 96 03/27/2018   CO2 22 03/27/2018   Lab Results  Component Value Date   ALT 10 12/19/2017   AST 14 12/19/2017   ALKPHOS 116 12/19/2017   BILITOT 0.4 12/19/2017   Lab Results  Component Value Date   CHOL 163 12/19/2017   Lab Results  Component Value Date   HDL 55.70 12/19/2017   Lab Results  Component Value Date   LDLCALC 89 12/19/2017   Lab Results  Component Value Date   TRIG 88.0 12/19/2017   Lab Results  Component Value Date   CHOLHDL 3 12/19/2017   Lab Results  Component Value Date   PSA 0.85 12/19/2017   PSA 0.98 09/19/2016   PSA 0.45 07/29/2015   Lab Results  Component Value Date   HGBA1C 6.7 (H) 03/20/2018     IMPRESSION AND PLAN:  1) DM 2, good control. HbA1c today. Feet exam normal today. Lytes/cr today. High dose flu vaccine today.  2) HTN: The current medical regimen is effective;  continue present plan and medications. Lytes/cr today.  3) CRI III: avoids NSAIDs.  Hydrates well. Checking BMET today. Next routine f/u with nephrologist will be 06/2019.  Will send lab results to Dr. Moshe Cipro (neph).  An After Visit Summary was printed and given to the patient.  FOLLOW UP: Return in about 4 months (around 10/20/2018) for routine chronic illness f/u.  Signed:  Crissie Sickles, MD           06/20/2018

## 2018-06-21 LAB — HEMOGLOBIN A1C
Est. average glucose Bld gHb Est-mCnc: 126 mg/dL
Hgb A1c MFr Bld: 6 % — ABNORMAL HIGH (ref 4.8–5.6)

## 2018-06-21 LAB — BASIC METABOLIC PANEL
BUN / CREAT RATIO: 6 — AB (ref 10–24)
BUN: 8 mg/dL (ref 8–27)
CO2: 24 mmol/L (ref 20–29)
CREATININE: 1.34 mg/dL — AB (ref 0.76–1.27)
Calcium: 9.5 mg/dL (ref 8.6–10.2)
Chloride: 94 mmol/L — ABNORMAL LOW (ref 96–106)
GFR calc Af Amer: 62 mL/min/{1.73_m2} (ref >59)
GFR, EST NON AFRICAN AMERICAN: 53 mL/min/{1.73_m2} — AB (ref >59)
Glucose: 88 mg/dL (ref 65–99)
POTASSIUM: 4.7 mmol/L (ref 3.5–5.2)
SODIUM: 132 mmol/L — AB (ref 134–144)

## 2018-06-23 ENCOUNTER — Other Ambulatory Visit: Payer: Self-pay | Admitting: Family Medicine

## 2018-07-04 ENCOUNTER — Other Ambulatory Visit: Payer: Self-pay | Admitting: Family Medicine

## 2018-10-17 ENCOUNTER — Encounter: Payer: Self-pay | Admitting: Family Medicine

## 2018-10-17 ENCOUNTER — Ambulatory Visit: Payer: Medicare Other | Admitting: Family Medicine

## 2018-10-17 VITALS — BP 113/74 | HR 60 | Temp 97.5°F | Resp 16 | Ht 65.0 in | Wt 160.1 lb

## 2018-10-17 DIAGNOSIS — E118 Type 2 diabetes mellitus with unspecified complications: Secondary | ICD-10-CM | POA: Diagnosis not present

## 2018-10-17 DIAGNOSIS — I10 Essential (primary) hypertension: Secondary | ICD-10-CM | POA: Diagnosis not present

## 2018-10-17 DIAGNOSIS — E663 Overweight: Secondary | ICD-10-CM | POA: Diagnosis not present

## 2018-10-17 DIAGNOSIS — N183 Chronic kidney disease, stage 3 unspecified: Secondary | ICD-10-CM

## 2018-10-17 MED ORDER — PIOGLITAZONE HCL 15 MG PO TABS: ORAL_TABLET | ORAL | 1 refills | Status: DC

## 2018-10-17 NOTE — Progress Notes (Signed)
OFFICE VISIT  10/17/2018   CC:  Chief Complaint  Patient presents with  . Follow-up    F/U [facility]    HPI:    Patient is a 73 y.o. Caucasian male who presents for 4 mo f/u DM 2, HTN, CRI III.  DM: taking 1/2 of 15mg  pioglit qd.  Glucoses still avg 95. Eating diabetic diet.  Has cut out most alcohol.  HTN: Avg bp at home 120/70.  HR in the 65-70 range.  CRI: he is drinking 65 oz of water per day.  Avoid NSAIDs.  ROS: no CP, no SOB, no wheezing, no cough, no dizziness, no HAs, no rashes, no melena/hematochezia.  No polyuria or polydipsia.  No myalgias or arthralgias. No LE swelling.  Past Medical History:  Diagnosis Date  . Chronic prostatitis    Very mild elevation of PSA (>4), referred to Urol where PSA repeat was 1.0 on 06/16/13.  Annual PSA repeat is all that is needed now per urologist.  Most recent 07/2015 was 0.45.  Marland Kitchen Chronic renal insufficiency, stage III (moderate) (HCC)    Baseline GFR as of 2019= 50s.    . Diabetes mellitus with complication ([place]) Dx'd fall 2011  . HTN (hypertension)    Renal/aortic doppler u/s normal 06/2010  . Hypercalcemia   . Hyperlipidemia 2016   Statin intolerant.  Pt refuses any further trial of statin as of 2018.  . Nonspecific abnormal electrocardiogram (ECG) (EKG) Fall 2011   TWI in inferior leads: myocardial perfusion scan neg and echo normal 07/2010  . Tobacco dependence   . UTI (lower urinary tract infection)    Feb 2012 (klebsiella--dx at [facility]); 03/2013 e coli.     Past Surgical History:  Procedure Laterality Date  . CARDIOVASCULAR STRESS TEST  07/2010   Myocardial perfusion scan neg/low risk.  Marland Kitchen CATARACT EXTRACTION  2007, 2008   Bilateral ([facility] on [address].  . COLONOSCOPY  12/20/04;12/2014   2016 no polyps: recall 5 yrs due to [place] of colon ca  . ESOPHAGOGASTRODUODENOSCOPY  11/2004   esoph stricture/dilation  . TRANSTHORACIC ECHOCARDIOGRAM  07/2010   EF =>55%; LA mildly dilated; trace MR/TR;      Outpatient Medications Prior to Visit  Medication Sig Dispense Refill  . aspirin EC 81 MG tablet Take 81 mg by mouth daily.    . carvedilol (COREG) 6.25 MG tablet TAKE 1/2 (ONE-HALF) TABLET BY MOUTH IN THE MORNING AND 1 TABLET IN THE EVENING 45 tablet 5  . Dextromethorphan-guaiFENesin ([place] DM PO) Take by mouth.    . esomeprazole (NEXIUM) 20 MG capsule Take 20 mg by mouth daily at 12 noon.    . fexofenadine (ALLEGRA) 60 MG tablet 1 tab po bid for allergy symptoms 60 tablet 1  . furosemide (LASIX) 40 MG tablet TAKE 1 TABLET BY MOUTH ONCE DAILY 90 tablet 1  . olmesartan (BENICAR) 5 MG tablet Take 0.5 tablets (2.5 mg total) by mouth daily. 45 tablet 1  . pioglitazone (ACTOS) 15 MG tablet TAKE 1 TABLET BY MOUTH ONCE DAILY 90 tablet 1   No facility-administered medications prior to visit.     Allergies  Allergen Reactions  . Bactrim [Sulfamethoxazole-Trimethoprim] Nausea Only    ROS As per HPI  PE: Blood pressure 113/74, pulse 60, temperature (!) 97.5 F (36.4 C), temperature source Oral, resp. rate 16, height 5\' 5"  (1.651 m), weight 160 lb 2 oz (72.6 kg), SpO2 100 %. Gen: Alert, well appearing.  Patient is oriented to person, place, time, and situation.  AFFECT: pleasant, lucid thought and speech. CV: RRR, no m/r/g.   LUNGS: CTA bilat, nonlabored resps, good aeration in all lung fields. EXT: no clubbing or cyanosis.  no edema.     LABS:    Chemistry      Component Value Date/Time   NA 132 (L) 06/20/2018 1436   K 4.7 06/20/2018 1436   CL 94 (L) 06/20/2018 1436   CO2 24 06/20/2018 1436   BUN 8 06/20/2018 1436   CREATININE 1.34 (H) 06/20/2018 1436   CREATININE 1.87 (H) 05/29/2012 1610      Component Value Date/Time   CALCIUM 9.5 06/20/2018 1436   ALKPHOS 116 12/19/2017 0839   AST 14 12/19/2017 0839   ALT 10 12/19/2017 0839   BILITOT 0.4 12/19/2017 0839     Lab Results  Component Value Date   CHOL 163 12/19/2017   HDL 55.70 12/19/2017   LDLCALC 89 12/19/2017    TRIG 88.0 12/19/2017   CHOLHDL 3 12/19/2017   Lab Results  Component Value Date   HGBA1C 6.0 (H) 06/20/2018   IMPRESSION AND PLAN:  1) DM 2, great control.  The current medical regimen is effective;  continue present plan and medications. HbA1c today. BMET today.  2) HTN: The current medical regimen is effective;  continue present plan and medications. BMET today.  3) CRI III: avoids NSAIDs, hydrates well.  Will check BMET and CC the results to his nephrologist.  An After Visit Summary was printed and given to the patient.  FOLLOW UP: Return in about 4 months (around 02/15/2019) for annual CPE (fasting).  Signed:  Crissie Sickles, MD           10/17/2018

## 2018-10-18 LAB — BASIC METABOLIC PANEL
BUN/Creatinine Ratio: 8 — ABNORMAL LOW (ref 10–24)
BUN: 11 mg/dL (ref 8–27)
CALCIUM: 9.6 mg/dL (ref 8.6–10.2)
CO2: 24 mmol/L (ref 20–29)
Chloride: 90 mmol/L — ABNORMAL LOW (ref 96–106)
Creatinine, Ser: 1.34 mg/dL — ABNORMAL HIGH (ref 0.76–1.27)
GFR calc Af Amer: 61 mL/min/{1.73_m2} (ref >59)
GFR calc non Af Amer: 53 mL/min/{1.73_m2} — ABNORMAL LOW (ref >59)
Glucose: 93 mg/dL (ref 65–99)
Potassium: 4.7 mmol/L (ref 3.5–5.2)
Sodium: 129 mmol/L — ABNORMAL LOW (ref 134–144)

## 2018-10-18 LAB — HEMOGLOBIN A1C
Est. average glucose Bld gHb Est-mCnc: 151 mg/dL
Hgb A1c MFr Bld: 6.9 % — ABNORMAL HIGH (ref 4.8–5.6)

## 2018-11-05 ENCOUNTER — Other Ambulatory Visit: Payer: Self-pay | Admitting: Family Medicine

## 2018-12-09 ENCOUNTER — Other Ambulatory Visit: Payer: Self-pay | Admitting: Family Medicine

## 2018-12-15 ENCOUNTER — Other Ambulatory Visit: Payer: Self-pay | Admitting: Family Medicine

## 2018-12-31 ENCOUNTER — Other Ambulatory Visit: Payer: Self-pay | Admitting: Family Medicine

## 2019-01-16 ENCOUNTER — Ambulatory Visit: Payer: Medicare Other | Admitting: Family Medicine

## 2019-01-16 ENCOUNTER — Ambulatory Visit: Payer: Medicare Other

## 2019-02-01 ENCOUNTER — Other Ambulatory Visit: Payer: Self-pay | Admitting: Family Medicine

## 2019-02-06 ENCOUNTER — Encounter: Payer: Self-pay | Admitting: Family Medicine

## 2019-02-06 ENCOUNTER — Ambulatory Visit (INDEPENDENT_AMBULATORY_CARE_PROVIDER_SITE_OTHER): Payer: Medicare Other | Admitting: Family Medicine

## 2019-02-06 VITALS — BP 111/68 | HR 66

## 2019-02-06 DIAGNOSIS — N183 Chronic kidney disease, stage 3 unspecified: Secondary | ICD-10-CM

## 2019-02-06 DIAGNOSIS — J069 Acute upper respiratory infection, unspecified: Secondary | ICD-10-CM | POA: Diagnosis not present

## 2019-02-06 DIAGNOSIS — I10 Essential (primary) hypertension: Secondary | ICD-10-CM | POA: Diagnosis not present

## 2019-02-06 DIAGNOSIS — E119 Type 2 diabetes mellitus without complications: Secondary | ICD-10-CM | POA: Diagnosis not present

## 2019-02-06 DIAGNOSIS — B9789 Other viral agents as the cause of diseases classified elsewhere: Secondary | ICD-10-CM

## 2019-02-06 NOTE — Progress Notes (Signed)
Virtual Visit via Video Note  I connected with pt on 02/06/19 at 10:20 AM EDT by telephone (pt w/out computer or smart phone) and verified that I am speaking with the correct person using two identifiers.  Location patient: home Location provider:work or home office Persons participating in the virtual visit: patient, provider  I discussed the limitations of evaluation and management by telemedicine/telephone and the availability of in person appointments. The patient expressed understanding and agreed to proceed.  Telemedicine/telephone visit is a necessity given the COVID-19 restrictions in place at the current time.  HPI: 73 y/o WM with DM, HTN, CRI III, and tobacco dependence with whom I am doing a telephone visit today (due to COVID-19 pandemic restrictions).  Has a few days hx of clear runny nose, PND, has a bit of PND cough in night time mostly. No fever, no HA, no ST.  NO SOB.  Eating and drinking fine. No loss of taste or smell.  DM: fastings 100 avg, occ 2H PP as high as 170 but usually lower. No polyuria or polydipsia.  ROS: See pertinent positives and negatives per HPI.  Past Medical History:  Diagnosis Date  . Chronic prostatitis    Very mild elevation of PSA (>4), referred to Urol where PSA repeat was 1.0 on 06/16/13.  Annual PSA repeat is all that is needed now per urologist.  Most recent 07/2015 was 0.45.  Marland Kitchen Chronic renal insufficiency, stage III (moderate) (HCC)    Baseline GFR as of 2019= 50s.    . Diabetes mellitus with complication ([place]) Dx'd fall 2011  . HTN (hypertension)    Renal/aortic doppler u/s normal 06/2010  . Hypercalcemia   . Hyperlipidemia 2016   Statin intolerant.  Pt refuses any further trial of statin as of 2018.  . Nonspecific abnormal electrocardiogram (ECG) (EKG) Fall 2011   TWI in inferior leads: myocardial perfusion scan neg and echo normal 07/2010  . Tobacco dependence   . UTI (lower urinary tract infection)    Feb 2012 (klebsiella--dx at  [facility]); 03/2013 e coli.     Past Surgical History:  Procedure Laterality Date  . CARDIOVASCULAR STRESS TEST  07/2010   Myocardial perfusion scan neg/low risk.  Marland Kitchen CATARACT EXTRACTION  2007, 2008   Bilateral ([facility] on [address].  . COLONOSCOPY  12/20/04;12/2014   2016 no polyps: recall 5 yrs due to [place] of colon ca  . ESOPHAGOGASTRODUODENOSCOPY  11/2004   esoph stricture/dilation  . TRANSTHORACIC ECHOCARDIOGRAM  07/2010   EF =>55%; LA mildly dilated; trace MR/TR;     Family History  Problem Relation Age of Onset  . Dementia Mother   . Pneumonia Father        died in 4's of pneumonia, was otherwise healthy  . Cancer Sister        colon cancer.  Died age 46  . Diabetes Paternal Aunt   . Diabetes Sister   . Colon cancer Neg Hx      Current Outpatient Medications:  .  aspirin EC 81 MG tablet, Take 81 mg by mouth daily., Disp: , Rfl:  .  carvedilol (COREG) 6.25 MG tablet, TAKE 1/2 (ONE-HALF) TABLET BY MOUTH IN THE MORNING AND 1 TABLET IN THE EVENING, Disp: 135 tablet, Rfl: 1 .  Dextromethorphan-guaiFENesin ([place] DM PO), Take by mouth., Disp: , Rfl:  .  esomeprazole (NEXIUM) 20 MG capsule, Take 20 mg by mouth daily at 12 noon., Disp: , Rfl:  .  fexofenadine (ALLEGRA) 60 MG tablet, 1 tab  po bid for allergy symptoms, Disp: 60 tablet, Rfl: 1 .  furosemide (LASIX) 40 MG tablet, Take 1 tablet by mouth once daily, Disp: 90 tablet, Rfl: 0 .  olmesartan (BENICAR) 5 MG tablet, Take 1/2 (one-half) tablet by mouth once daily, Disp: 45 tablet, Rfl: 1 .  pioglitazone (ACTOS) 15 MG tablet, Take 1/2 (one-half) tablet by mouth once daily, Disp: 45 tablet, Rfl: 0  EXAM:  VITALS per patient if applicable:  GENERAL: alert, oriented, appears well and in no acute distress  HEENT: atraumatic, conjunttiva clear, no obvious abnormalities on inspection of external nose and ears  NECK: normal movements of the head and neck  LUNGS: on inspection no signs of respiratory  distress, breathing rate appears normal, no obvious gross SOB, gasping or wheezing  CV: no obvious cyanosis  MS: moves all visible extremities without noticeable abnormality  PSYCH/NEURO: pleasant and cooperative, no obvious depression or anxiety, speech and thought processing grossly intact  LABS: none today    Chemistry      Component Value Date/Time   NA 129 (L) 10/17/2018 1327   K 4.7 10/17/2018 1327   CL 90 (L) 10/17/2018 1327   CO2 24 10/17/2018 1327   BUN 11 10/17/2018 1327   CREATININE 1.34 (H) 10/17/2018 1327   CREATININE 1.87 (H) 05/29/2012 1610      Component Value Date/Time   CALCIUM 9.6 10/17/2018 1327   ALKPHOS 116 12/19/2017 0839   AST 14 12/19/2017 0839   ALT 10 12/19/2017 0839   BILITOT 0.4 12/19/2017 0839     Lab Results  Component Value Date   HGBA1C 6.9 (H) 10/17/2018   Lab Results  Component Value Date   CHOL 163 12/19/2017   HDL 55.70 12/19/2017   LDLCALC 89 12/19/2017   TRIG 88.0 12/19/2017   CHOLHDL 3 12/19/2017    ASSESSMENT AND PLAN:  Discussed the following assessment and plan:  1) Viral URI vs allergic rhinitis with PND cough. Symptomatic care discussed, contact precautions discussed. Signs/symptoms to call or return for were reviewed and pt expressed understanding.  2) DM 2, good control. HbA1c, lytes/cr, microalb/cr ordered future.  3) CRI III:  Avoiding NSAIDS.  Hydrates adequately. Lytes/cr ordered future.   I discussed the assessment and treatment plan with the patient. The patient was provided an opportunity to ask questions and all were answered. The patient agreed with the plan and demonstrated an understanding of the instructions.   The patient was advised to call back or seek an in-person evaluation if the symptoms worsen or if the condition fails to improve as anticipated.  Spent 15 min with pt today, with >50% of this time spent in counseling and care coordination regarding the above problems.  F/u: 4 mo  [facility]  Signed:  Crissie Sickles, MD           02/06/2019

## 2019-02-13 ENCOUNTER — Encounter: Payer: Medicare Other | Admitting: Family Medicine

## 2019-02-16 ENCOUNTER — Other Ambulatory Visit (INDEPENDENT_AMBULATORY_CARE_PROVIDER_SITE_OTHER): Payer: Medicare Other

## 2019-02-16 ENCOUNTER — Telehealth: Payer: Self-pay | Admitting: Family Medicine

## 2019-02-16 ENCOUNTER — Other Ambulatory Visit: Payer: Self-pay

## 2019-02-16 ENCOUNTER — Encounter: Payer: Self-pay | Admitting: Family Medicine

## 2019-02-16 ENCOUNTER — Ambulatory Visit (INDEPENDENT_AMBULATORY_CARE_PROVIDER_SITE_OTHER): Payer: Medicare Other | Admitting: Family Medicine

## 2019-02-16 VITALS — BP 124/70 | HR 60

## 2019-02-16 DIAGNOSIS — J019 Acute sinusitis, unspecified: Secondary | ICD-10-CM

## 2019-02-16 DIAGNOSIS — E119 Type 2 diabetes mellitus without complications: Secondary | ICD-10-CM | POA: Diagnosis not present

## 2019-02-16 DIAGNOSIS — R0982 Postnasal drip: Secondary | ICD-10-CM | POA: Diagnosis not present

## 2019-02-16 DIAGNOSIS — R05 Cough: Secondary | ICD-10-CM

## 2019-02-16 DIAGNOSIS — I10 Essential (primary) hypertension: Secondary | ICD-10-CM | POA: Diagnosis not present

## 2019-02-16 DIAGNOSIS — N183 Chronic kidney disease, stage 3 unspecified: Secondary | ICD-10-CM

## 2019-02-16 DIAGNOSIS — R059 Cough, unspecified: Secondary | ICD-10-CM

## 2019-02-16 MED ORDER — AZITHROMYCIN 250 MG PO TABS: ORAL_TABLET | ORAL | 0 refills | Status: DC

## 2019-02-16 MED ORDER — PREDNISONE 20 MG PO TABS: ORAL_TABLET | ORAL | 0 refills | Status: DC

## 2019-02-16 NOTE — Telephone Encounter (Signed)
Have him set up telephone visit [id number]

## 2019-02-16 NOTE — Telephone Encounter (Signed)
Telephone visit scheduled @ 1:30pm today.

## 2019-02-16 NOTE — Progress Notes (Signed)
Virtual Visit via Video Note  I connected with pt on 02/16/19 at  1:30 PM EDT by telephone (pt does not have the technology necessary for video visit) and verified that I am speaking with the correct person using two identifiers.  Location patient: home Location provider:work or home office Persons participating in the virtual visit: patient, provider  I discussed the limitations of evaluation and management by telemedicine/telephone and the availability of in person appointments. The patient expressed understanding and agreed to proceed.  Telemedicine/telephone visit is a necessity given the COVID-19 restrictions in place at the current time.  HPI: 73 y/o WM with whom I am doing a telephone visit today (due to COVID-19 pandemic restrictions) for f/u of recent respiratory illness.  I saw him 10d ago when he had been having a few days of runny nose, PND, and mild PND cough.  I dx'd viral URI vs allergic rhinitis and discussed some symptomatic care with him. Interim hx: Nose gets very stopped up, +PND, coughs up the postnasal drainage.  No rattling in chest. Took mucinex DM regularly x 1 wk, helped a little. Subjective fever last night.  Cough usually not productive except some at night. No SOB or DOE.  No face pain or upper teeth pain.   Has some fatigue and mild malaise.  No HA or ST.  ROS: no dizziness, no CP, no hemoptysis, no abd pain, no n/v. No loss of taste or smell.   ROS: See pertinent positives and negatives per HPI.  Past Medical History:  Diagnosis Date  . Chronic prostatitis    Very mild elevation of PSA (>4), referred to Urol where PSA repeat was 1.0 on 06/16/13.  Annual PSA repeat is all that is needed now per urologist.  Most recent 07/2015 was 0.45.  Marland Kitchen Chronic renal insufficiency, stage III (moderate) (HCC)    Baseline GFR as of 2019= 50s.    . Diabetes mellitus with complication ([place]) Dx'd fall 2011  . HTN (hypertension)    Renal/aortic doppler u/s normal 06/2010  .  Hypercalcemia   . Hyperlipidemia 2016   Statin intolerant.  Pt refuses any further trial of statin as of 2018.  . Nonspecific abnormal electrocardiogram (ECG) (EKG) Fall 2011   TWI in inferior leads: myocardial perfusion scan neg and echo normal 07/2010  . Tobacco dependence   . UTI (lower urinary tract infection)    Feb 2012 (klebsiella--dx at [facility]); 03/2013 e coli.     Past Surgical History:  Procedure Laterality Date  . CARDIOVASCULAR STRESS TEST  07/2010   Myocardial perfusion scan neg/low risk.  Marland Kitchen CATARACT EXTRACTION  2007, 2008   Bilateral ([facility] on [address].  . COLONOSCOPY  12/20/04;12/2014   2016 no polyps: recall 5 yrs due to [place] of colon ca  . ESOPHAGOGASTRODUODENOSCOPY  11/2004   esoph stricture/dilation  . TRANSTHORACIC ECHOCARDIOGRAM  07/2010   EF =>55%; LA mildly dilated; trace MR/TR;     Family History  Problem Relation Age of Onset  . Dementia Mother   . Pneumonia Father        died in 35's of pneumonia, was otherwise healthy  . Cancer Sister        colon cancer.  Died age 72  . Diabetes Paternal Aunt   . Diabetes Sister   . Colon cancer Neg Hx      Current Outpatient Medications:  .  aspirin EC 81 MG tablet, Take 81 mg by mouth daily., Disp: , Rfl:  .  carvedilol (  COREG) 6.25 MG tablet, TAKE 1/2 (ONE-HALF) TABLET BY MOUTH IN THE MORNING AND 1 TABLET IN THE EVENING, Disp: 135 tablet, Rfl: 1 .  Dextromethorphan-guaiFENesin ([place] DM PO), Take by mouth., Disp: , Rfl:  .  esomeprazole (NEXIUM) 20 MG capsule, Take 20 mg by mouth daily at 12 noon., Disp: , Rfl:  .  furosemide (LASIX) 40 MG tablet, Take 1 tablet by mouth once daily, Disp: 90 tablet, Rfl: 0 .  olmesartan (BENICAR) 5 MG tablet, Take 1/2 (one-half) tablet by mouth once daily, Disp: 45 tablet, Rfl: 1 .  pioglitazone (ACTOS) 15 MG tablet, Take 1/2 (one-half) tablet by mouth once daily, Disp: 45 tablet, Rfl: 0 .  fexofenadine (ALLEGRA) 60 MG tablet, 1 tab po bid for allergy  symptoms (Patient not taking: Reported on 02/06/2019), Disp: 60 tablet, Rfl: 1  EXAM:  VITALS per patient if applicable:  GENERAL: alert, oriented, sounds well and in no acute distress He does sound like his nose is stopped up when he talks and breaths. No further exam b/c this was a telephone encounter.  LABS: none today  Lab Results  Component Value Date   HGBA1C 6.9 (H) 10/17/2018     Chemistry      Component Value Date/Time   NA 129 (L) 10/17/2018 1327   K 4.7 10/17/2018 1327   CL 90 (L) 10/17/2018 1327   CO2 24 10/17/2018 1327   BUN 11 10/17/2018 1327   CREATININE 1.34 (H) 10/17/2018 1327   CREATININE 1.87 (H) 05/29/2012 1610      Component Value Date/Time   CALCIUM 9.6 10/17/2018 1327   ALKPHOS 116 12/19/2017 0839   AST 14 12/19/2017 0839   ALT 10 12/19/2017 0839   BILITOT 0.4 12/19/2017 0839      ASSESSMENT AND PLAN:  Discussed the following assessment and plan:  1) Prolonged URI with PND cough. Question of fever last night. Will treat with Z pack and prednisone 20mg  qd x 5d. Continue current otc symptomatic care. Signs/symptoms to call or return for were reviewed and pt expressed understanding.   I discussed the assessment and treatment plan with the patient. The patient was provided an opportunity to ask questions and all were answered. The patient agreed with the plan and demonstrated an understanding of the instructions.   The patient was advised to call back or seek an in-person evaluation if the symptoms worsen or if the condition fails to improve as anticipated.  Spent 15 min with pt today, with >50% of this time spent in counseling and care coordination regarding the above problems.  F/u: if not improving  Signed:  Crissie Sickles, MD           02/16/2019

## 2019-02-16 NOTE — Telephone Encounter (Signed)
Patient presented to office this morning for labs and while in the office he states that he is still having nausea, congestion, and possibly fevers ( he has no way of checking but his wife said he felt hot a few times )  Patient had virtual visit 02/06/2019.  Please advise.

## 2019-02-16 NOTE — Progress Notes (Signed)
Patient unable to provide urine sample

## 2019-02-17 LAB — COMPREHENSIVE METABOLIC PANEL
ALT: 14 IU/L (ref 0–44)
AST: 16 IU/L (ref 0–40)
Albumin/Globulin Ratio: 1.9 (ref 1.2–2.2)
Albumin: 4.3 g/dL (ref 3.7–4.7)
Alkaline Phosphatase: 117 IU/L (ref 39–117)
BUN/Creatinine Ratio: 9 — ABNORMAL LOW (ref 10–24)
BUN: 12 mg/dL (ref 8–27)
Bilirubin Total: 0.3 mg/dL (ref 0.0–1.2)
CO2: 22 mmol/L (ref 20–29)
Calcium: 9.2 mg/dL (ref 8.6–10.2)
Chloride: 92 mmol/L — ABNORMAL LOW (ref 96–106)
Creatinine, Ser: 1.39 mg/dL — ABNORMAL HIGH (ref 0.76–1.27)
GFR calc Af Amer: 59 mL/min/{1.73_m2} — ABNORMAL LOW (ref >59)
GFR calc non Af Amer: 51 mL/min/{1.73_m2} — ABNORMAL LOW (ref >59)
Globulin, Total: 2.3 g/dL (ref 1.5–4.5)
Glucose: 100 mg/dL — ABNORMAL HIGH (ref 65–99)
Potassium: 4.3 mmol/L (ref 3.5–5.2)
Sodium: 126 mmol/L — ABNORMAL LOW (ref 134–144)
Total Protein: 6.6 g/dL (ref 6.0–8.5)

## 2019-02-17 LAB — BASIC METABOLIC PANEL
BUN: 12 (ref 4–21)
Creatinine: 1.4 — AB (ref 0.6–1.3)
Glucose: 100
Potassium: 4.3 (ref 3.4–5.3)
Sodium: 126 — AB (ref 137–147)

## 2019-02-17 LAB — HEMOGLOBIN A1C
Est. average glucose Bld gHb Est-mCnc: 140 mg/dL
Hgb A1c MFr Bld: 6.5 % — ABNORMAL HIGH (ref 4.8–5.6)

## 2019-02-17 LAB — HEPATIC FUNCTION PANEL
ALT: 14 (ref 10–40)
AST: 16 (ref 14–40)
Alkaline Phosphatase: 117 (ref 25–125)
Bilirubin, Total: 0.3

## 2019-02-18 ENCOUNTER — Telehealth: Payer: Self-pay

## 2019-02-18 ENCOUNTER — Encounter: Payer: Self-pay | Admitting: Family Medicine

## 2019-02-18 ENCOUNTER — Other Ambulatory Visit: Payer: Self-pay

## 2019-02-18 DIAGNOSIS — E871 Hypo-osmolality and hyponatremia: Secondary | ICD-10-CM

## 2019-02-18 NOTE — Telephone Encounter (Signed)
Disregard this message 

## 2019-02-18 NOTE — Telephone Encounter (Signed)
Received lab results from 02/17/19 blood draw. Results are also in pt's chart.

## 2019-02-19 LAB — LIPID PANEL

## 2019-03-02 ENCOUNTER — Other Ambulatory Visit: Payer: Medicare Other

## 2019-03-03 ENCOUNTER — Other Ambulatory Visit: Payer: Self-pay

## 2019-03-03 ENCOUNTER — Ambulatory Visit (INDEPENDENT_AMBULATORY_CARE_PROVIDER_SITE_OTHER): Payer: Medicare Other | Admitting: Family Medicine

## 2019-03-03 DIAGNOSIS — E871 Hypo-osmolality and hyponatremia: Secondary | ICD-10-CM | POA: Diagnosis not present

## 2019-03-03 DIAGNOSIS — E119 Type 2 diabetes mellitus without complications: Secondary | ICD-10-CM

## 2019-03-03 LAB — BASIC METABOLIC PANEL
BUN: 9 mg/dL (ref 6–23)
CO2: 27 mEq/L (ref 19–32)
Calcium: 9 mg/dL (ref 8.4–10.5)
Chloride: 96 mEq/L (ref 96–112)
Creatinine, Ser: 1.19 mg/dL (ref 0.40–1.50)
GFR: 60.2 mL/min (ref >60.00)
Glucose, Bld: 158 mg/dL — ABNORMAL HIGH (ref 70–99)
Potassium: 4.7 mEq/L (ref 3.5–5.1)
Sodium: 129 mEq/L — ABNORMAL LOW (ref 135–145)

## 2019-03-03 LAB — MICROALBUMIN / CREATININE URINE RATIO
Creatinine,U: 58 mg/dL
Microalb Creat Ratio: 2.3 mg/g (ref 0.0–30.0)
Microalb, Ur: 1.3 mg/dL (ref 0.0–1.9)

## 2019-03-19 ENCOUNTER — Other Ambulatory Visit: Payer: Self-pay | Admitting: Family Medicine

## 2019-04-17 ENCOUNTER — Ambulatory Visit: Payer: Medicare Other

## 2019-05-15 ENCOUNTER — Other Ambulatory Visit: Payer: Self-pay

## 2019-05-15 ENCOUNTER — Ambulatory Visit (INDEPENDENT_AMBULATORY_CARE_PROVIDER_SITE_OTHER): Payer: Medicare Other

## 2019-05-15 VITALS — BP 125/72 | HR 69 | Temp 97.6°F | Wt 162.0 lb

## 2019-05-15 DIAGNOSIS — E663 Overweight: Secondary | ICD-10-CM | POA: Diagnosis not present

## 2019-05-15 DIAGNOSIS — Z0000 Encounter for general adult medical examination without abnormal findings: Secondary | ICD-10-CM | POA: Diagnosis not present

## 2019-05-15 NOTE — Patient Instructions (Addendum)
Schedule eye exam.   Bring a copy of your living will and/or healthcare power of attorney to your next office visit.  Continue doing brain stimulating activities (puzzles, reading, adult coloring books, staying active) to keep memory sharp.  Fall Prevention in the Home, Adult Falls can cause injuries. They can happen to people of all ages. There are many things you can do to make your home safe and to help prevent falls. Ask for help when making these changes, if needed. What actions can I take to prevent falls? General Instructions  Use good lighting in all rooms. Replace any light bulbs that burn out.  Turn on the lights when you go into a dark area. Use night-lights.  Keep items that you use often in easy-to-reach places. Lower the shelves around your home if necessary.  Set up your furniture so you have a clear path. Avoid moving your furniture around.  Do not have throw rugs and other things on the floor that can make you trip.  Avoid walking on wet floors.  If any of your floors are uneven, fix them.  Add color or contrast paint or tape to clearly mark and help you see: ? Any grab bars or handrails. ? First and last steps of stairways. ? Where the edge of each step is.  If you use a stepladder: ? Make sure that it is fully opened. Do not climb a closed stepladder. ? Make sure that both sides of the stepladder are locked into place. ? Ask someone to hold the stepladder for you while you use it.  If there are any pets around you, be aware of where they are. What can I do in the bathroom?      Keep the floor dry. Clean up any water that spills onto the floor as soon as it happens.  Remove soap buildup in the tub or shower regularly.  Use non-skid mats or decals on the floor of the tub or shower.  Attach bath mats securely with double-sided, non-slip rug tape.  If you need to sit down in the shower, use a plastic, non-slip stool.  Install grab bars by the toilet  and in the tub and shower. Do not use towel bars as grab bars. What can I do in the bedroom?  Make sure that you have a light by your bed that is easy to reach.  Do not use any sheets or blankets that are too big for your bed. They should not hang down onto the floor.  Have a firm chair that has side arms. You can use this for support while you get dressed. What can I do in the kitchen?  Clean up any spills right away.  If you need to reach something above you, use a strong step stool that has a grab bar.  Keep electrical cords out of the way.  Do not use floor polish or wax that makes floors slippery. If you must use wax, use non-skid floor wax. What can I do with my stairs?  Do not leave any items on the stairs.  Make sure that you have a light switch at the top of the stairs and the bottom of the stairs. If you do not have them, ask someone to add them for you.  Make sure that there are handrails on both sides of the stairs, and use them. Fix handrails that are broken or loose. Make sure that handrails are as long as the stairways.  Install non-slip stair  treads on all stairs in your home.  Avoid having throw rugs at the top or bottom of the stairs. If you do have throw rugs, attach them to the floor with carpet tape.  Choose a carpet that does not hide the edge of the steps on the stairway.  Check any carpeting to make sure that it is firmly attached to the stairs. Fix any carpet that is loose or worn. What can I do on the outside of my home?  Use bright outdoor lighting.  Regularly fix the edges of walkways and driveways and fix any cracks.  Remove anything that might make you trip as you walk through a door, such as a raised step or threshold.  Trim any bushes or trees on the path to your home.  Regularly check to see if handrails are loose or broken. Make sure that both sides of any steps have handrails.  Install guardrails along the edges of any raised decks and  porches.  Clear walking paths of anything that might make someone trip, such as tools or rocks.  Have any leaves, snow, or ice cleared regularly.  Use sand or salt on walking paths during winter.  Clean up any spills in your garage right away. This includes grease or oil spills. What other actions can I take?  Wear shoes that: ? Have a low heel. Do not wear high heels. ? Have rubber bottoms. ? Are comfortable and fit you well. ? Are closed at the toe. Do not wear open-toe sandals.  Use tools that help you move around (mobility aids) if they are needed. These include: ? Canes. ? Walkers. ? Scooters. ? Crutches.  Review your medicines with your doctor. Some medicines can make you feel dizzy. This can increase your chance of falling. Ask your doctor what other things you can do to help prevent falls. Where to find more information  Centers for Disease Control and Prevention, STEADI: https://garcia.biz/  [employer] on Aging: BrainJudge.co.uk Contact a doctor if:  You are afraid of falling at home.  You feel weak, drowsy, or dizzy at home.  You fall at home. Summary  There are many simple things that you can do to make your home safe and to help prevent falls.  Ways to make your home safe include removing tripping hazards and installing grab bars in the bathroom.  Ask for help when making these changes in your home. This information is not intended to replace advice given to you by your health care provider. Make sure you discuss any questions you have with your health care provider. Document Released: 07/14/2009 Document Revised: 01/08/2019 Document Reviewed: 05/02/2017 Elsevier Patient Education  2020 [place] Maintenance, Male Adopting a healthy lifestyle and getting preventive care are important in promoting health and wellness. Ask your health care provider about:  The right schedule for you to have regular tests and exams.  Things  you can do on your own to prevent diseases and keep yourself healthy. What should I know about diet, weight, and exercise? Eat a healthy diet   Eat a diet that includes plenty of vegetables, fruits, low-fat dairy products, and lean protein.  Do not eat a lot of foods that are high in solid fats, added sugars, or sodium. Maintain a healthy weight Body mass index (BMI) is a measurement that can be used to identify possible weight problems. It estimates body fat based on height and weight. Your health care provider can help determine your BMI  and help you achieve or maintain a healthy weight. Get regular exercise Get regular exercise. This is one of the most important things you can do for your health. Most adults should:  Exercise for at least 150 minutes each week. The exercise should increase your heart rate and make you sweat (moderate-intensity exercise).  Do strengthening exercises at least twice a week. This is in addition to the moderate-intensity exercise.  Spend less time sitting. Even light physical activity can be beneficial. Watch cholesterol and blood lipids Have your blood tested for lipids and cholesterol at 70 years of age, then have this test every 5 years. You may need to have your cholesterol levels checked more often if:  Your lipid or cholesterol levels are high.  You are older than 71 years of age.  You are at high risk for heart disease. What should I know about cancer screening? Many types of cancers can be detected early and may often be prevented. Depending on your health history and family history, you may need to have cancer screening at various ages. This may include screening for:  Colorectal cancer.  Prostate cancer.  Skin cancer.  Lung cancer. What should I know about heart disease, diabetes, and high blood pressure? Blood pressure and heart disease  High blood pressure causes heart disease and increases the risk of stroke. This is more likely to  develop in people who have high blood pressure readings, are of African descent, or are overweight.  Talk with your health care provider about your target blood pressure readings.  Have your blood pressure checked: ? Every 3-5 years if you are 36-25 years of age. ? Every year if you are 6 years old or older.  If you are between the ages of 60 and 87 and are a current or former smoker, ask your health care provider if you should have a one-time screening for abdominal aortic aneurysm (AAA). Diabetes Have regular diabetes screenings. This checks your fasting blood sugar level. Have the screening done:  Once every three years after age 10 if you are at a normal weight and have a low risk for diabetes.  More often and at a younger age if you are overweight or have a high risk for diabetes. What should I know about preventing infection? Hepatitis B If you have a higher risk for hepatitis B, you should be screened for this virus. Talk with your health care provider to find out if you are at risk for hepatitis B infection. Hepatitis C Blood testing is recommended for:  Everyone born from [birth year] through 1963.  Anyone with known risk factors for hepatitis C. Sexually transmitted infections (STIs)  You should be screened each year for STIs, including gonorrhea and chlamydia, if: ? You are sexually active and are younger than 71 years of age. ? You are older than 71 years of age and your health care provider tells you that you are at risk for this type of infection. ? Your sexual activity has changed since you were last screened, and you are at increased risk for chlamydia or gonorrhea. Ask your health care provider if you are at risk.  Ask your health care provider about whether you are at high risk for HIV. Your health care provider may recommend a prescription medicine to help prevent HIV infection. If you choose to take medicine to prevent HIV, you should first get tested for HIV. You should  then be tested every 3 months for as long as you are  taking the medicine. Follow these instructions at home: Lifestyle  Do not use any products that contain nicotine or tobacco, such as cigarettes, e-cigarettes, and chewing tobacco. If you need help quitting, ask your health care provider.  Do not use street drugs.  Do not share needles.  Ask your health care provider for help if you need support or information about quitting drugs. Alcohol use  Do not drink alcohol if your health care provider tells you not to drink.  If you drink alcohol: ? Limit how much you have to 0-2 drinks a day. ? Be aware of how much alcohol is in your drink. In the [place] one drink equals one 12 oz bottle of beer (355 mL), one 5 oz glass of wine (148 mL), or one 1 oz glass of hard liquor (44 mL). General instructions  Schedule regular health, dental, and eye exams.  Stay current with your vaccines.  Tell your health care provider if: ? You often feel depressed. ? You have ever been abused or do not feel safe at home. Summary  Adopting a healthy lifestyle and getting preventive care are important in promoting health and wellness.  Follow your health care provider's instructions about healthy diet, exercising, and getting tested or screened for diseases.  Follow your health care provider's instructions on monitoring your cholesterol and blood pressure. This information is not intended to replace advice given to you by your health care provider. Make sure you discuss any questions you have with your health care provider. Document Released: 03/15/2008 Document Revised: 09/10/2018 Document Reviewed: 09/10/2018 Elsevier Patient Education  2020 [employer].

## 2019-05-15 NOTE — Progress Notes (Signed)
  I connected with patient 05/15/19 at  2:00 PM EDT by a video/audio enabled telemedicine application and verified that I am speaking with the correct person using two identifiers. Patient stated full name and DOB. Patient gave permission to continue with virtual visit. Patient's location was at home and Nurse's location was at [place] office.

## 2019-05-15 NOTE — Progress Notes (Signed)
Subjective:   Antonio Serrano is a 73 y.o. male who presents for Medicare Annual/Subsequent preventive examination.  Review of Systems:  No ROS.  Medicare Wellness Virtual Visit.  Visual/audio telehealth visit, UTA vital signs.   See social history for additional risk factors.  Cardiac Risk Factors include: advanced age (>4men, >77 women);diabetes mellitus;dyslipidemia;male gender;hypertension;obesity (BMI >30kg/m2);smoking/ tobacco exposure;sedentary lifestyle   Sleep patterns: Sleeps 7 hours.  Home Safety/Smoke Alarms: Feels safe in home. Smoke alarms in place.  Living environment; residence and Firearm Safety: Lives with wife and dog in 1 story home. Seat Belt Safety/Bike Helmet: Wears seat belt.   Male:   CCS-colonoscopy 01/20/2015, diverticulosis. Recall 5 years.        PSA-  Lab Results  Component Value Date   PSA 0.85 12/19/2017   PSA 0.98 09/19/2016   PSA 0.45 07/29/2015       Objective:    Vitals: BP 125/72   Pulse 69   Temp 97.6 F (36.4 C)   Wt 162 lb (73.5 kg)   BMI 26.96 kg/m   Body mass index is 26.96 kg/m.  Advanced Directives 05/15/2019 01/10/2018 09/19/2016 01/20/2015  Does Patient Have a Medical Advance Directive? Yes Yes Yes Yes  Type of Paramedic of [place];Living will Living will;Healthcare Power of [place];Living will Living will  Does patient want to make changes to medical advance directive? - - No - Patient declined -  Copy of [place] in Chart? No - copy requested No - copy requested No - copy requested No - copy requested    Tobacco Social History   Tobacco Use  Smoking Status Current Every Day Smoker  . Packs/day: 0.50  . Years: 50.00  . Pack years: 25.00  . Types: Cigarettes  Smokeless Tobacco Never Used     Ready to quit: Not Answered Counseling given: Not Answered    Past Medical History:  Diagnosis Date  . Chronic prostatitis    Very mild  elevation of PSA (>4), referred to [facility] where PSA repeat was 1.0 on 06/16/13.  Annual PSA repeat is all that is needed now per urologist.  Most recent 07/2015 was 0.45.  Marland Kitchen Chronic renal insufficiency, stage III (moderate) (HCC)    Baseline GFR as of 2019= 50s.    . Diabetes mellitus with complication ([place]) Dx'd fall 2011  . HTN (hypertension)    Renal/aortic doppler u/s normal 06/2010  . Hypercalcemia   . Hyperlipidemia 2016   Statin intolerant.  Pt refuses any further trial of statin as of 2018.  . Nonspecific abnormal electrocardiogram (ECG) (EKG) Fall 2011   TWI in inferior leads: myocardial perfusion scan neg and echo normal 07/2010  . Tobacco dependence   . UTI (lower urinary tract infection)    Feb 2012 (klebsiella--dx at [facility]); 03/2013 e coli.    Past Surgical History:  Procedure Laterality Date  . CARDIOVASCULAR STRESS TEST  07/2010   Myocardial perfusion scan neg/low risk.  Marland Kitchen CATARACT EXTRACTION  2007, 2008   Bilateral ([facility] on [address].  . COLONOSCOPY  12/20/04;12/2014   2016 no polyps: recall 5 yrs due to [place] of colon ca  . ESOPHAGOGASTRODUODENOSCOPY  11/2004   esoph stricture/dilation  . TRANSTHORACIC ECHOCARDIOGRAM  07/2010   EF =>55%; LA mildly dilated; trace MR/TR;    Family History  Problem Relation Age of Onset  . Dementia Mother   . Pneumonia Father        died in 68's  of pneumonia, was otherwise healthy  . Cancer Sister        colon cancer.  Died age 62  . Diabetes Paternal Aunt   . Diabetes Sister   . Colon cancer Neg Hx    Social History   Socioeconomic History  . Marital status: Married    Spouse name: Not on file  . Number of children: Not on file  . Years of education: Not on file  . Highest education level: Not on file  Occupational History  . Not on file  Social Needs  . Financial resource strain: Not on file  . Food insecurity    Worry: Not on file    Inability: Not on file  . Transportation needs    Medical: Not  on file    Non-medical: Not on file  Tobacco Use  . Smoking status: Current Every Day Smoker    Packs/day: 0.50    Years: 50.00    Pack years: 25.00    Types: Cigarettes  . Smokeless tobacco: Never Used  Substance and Sexual Activity  . Alcohol use: Not Currently    Alcohol/week: 0.0 standard drinks    Comment: rare alcohol  . Drug use: No  . Sexual activity: Not on file  Lifestyle  . Physical activity    Days per week: Not on file    Minutes per session: Not on file  . Stress: Not on file  Relationships  . Social Herbalist on phone: Not on file    Gets together: Not on file    Attends religious service: Not on file    Active member of club or organization: Not on file    Attends meetings of clubs or organizations: Not on file    Relationship status: Not on file  Other Topics Concern  . Not on file  Social History Narrative   Married, 4 grown children, 6 grandchildren.   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   Lives in [place].   25 pack-yr tobacco hx--current as of 05/2014.   Drinks about a six pack per week.  Enjoys golf--six handicap at one point.  No formal exercise.   Walks a lot for his job.          Outpatient Encounter Medications as of 05/15/2019  Medication Sig  . aspirin EC 81 MG tablet Take 81 mg by mouth daily.  . carvedilol (COREG) 6.25 MG tablet TAKE 1/2 (ONE-HALF) TABLET BY MOUTH IN THE MORNING AND 1 TABLET IN THE EVENING  . esomeprazole (NEXIUM) 20 MG capsule Take 20 mg by mouth daily at 12 noon.  . furosemide (LASIX) 40 MG tablet Take 1 tablet by mouth once daily  . olmesartan (BENICAR) 5 MG tablet Take 1/2 (one-half) tablet by mouth once daily  . pioglitazone (ACTOS) 15 MG tablet Take 1/2 (one-half) tablet by mouth once daily  . [DISCONTINUED] azithromycin (ZITHROMAX) 250 MG tablet 2 tabs po qd x 1d, then 1 tab po qd x 4d  . [DISCONTINUED] Dextromethorphan-guaiFENesin ([place] DM  PO) Take by mouth.  . [DISCONTINUED] fexofenadine (ALLEGRA) 60 MG tablet 1 tab po bid for allergy symptoms (Patient not taking: Reported on 02/06/2019)  . [DISCONTINUED] predniSONE (DELTASONE) 20 MG tablet 2 tabs po qd x 5d   No facility-administered encounter medications on file as of 05/15/2019.     Activities of Daily Living In your present state of health, do you have any  difficulty performing the following activities: 05/15/2019  Hearing? N  Vision? N  Difficulty concentrating or making decisions? N  Walking or climbing stairs? N  Dressing or bathing? N  Doing errands, shopping? N  Preparing Food and eating ? N  Using the Toilet? N  In the past six months, have you accidently leaked urine? N  Do you have problems with loss of bowel control? N  Managing your Medications? N  Managing your Finances? N  Housekeeping or managing your Housekeeping? N  Some recent data might be hidden    Patient Care Team: Tammi Sou, MD as PCP - General (Family Medicine) Chuck Hint (Dentistry) Macarthur Critchley, [place] as Consulting Physician (Optometry) Corliss Parish, MD as Consulting Physician (Nephrology) [facility] [place], Dani Gobble, MD as Consulting Physician (Cardiology)   Assessment:   This is a routine wellness examination for Antonio Serrano.  Exercise Activities and Dietary recommendations Current Exercise Habits: Home exercise routine, Type of exercise: Other - see comments(bike), Time (Minutes): 10, Exercise limited by: None identified   Diet (meal preparation, eat out, water intake, caffeinated beverages, dairy products, fruits and vegetables): Drinks coke, coffee and water.   Breakfast: toast, waffle, eggs Lunch: sandwich Dinner: vegetables and meat.   Goals      Patient Stated   . <enter goal here> (pt-stated)     Maintain current health status by eating healthy food and increasing exercise. Plans to purchase stationary bike.       Other   . Patient Stated      Maintain current health by staying active.     . Patient Stated     Maintain current health.        Fall Risk Fall Risk  05/15/2019 10/17/2018 01/10/2018 12/19/2017 09/19/2016  Falls in the past year? 0 1 No No No  Number falls in past yr: 0 0 - - -  Injury with Fall? 0 0 - - -  Follow up Falls prevention discussed - - - -    Depression Screen PHQ 2/9 Scores 05/15/2019 10/17/2018 01/10/2018 12/19/2017  PHQ - 2 Score 0 0 0 0  PHQ- 9 Score - - - 0    Cognitive Function MMSE - Mini Mental State Exam 01/10/2018  Orientation to time 5  Orientation to Place 5  Registration 3  Attention/ Calculation 5  Recall 3  Language- name 2 objects 2  Language- repeat 1  Language- follow 3 step command 3  Language- read & follow direction 1  Write a sentence 1  Copy design 1  Total score 30  Ad8 score reviewed for issues:  Issues making decisions: 0  Less interest in hobbies / activities: 0  Repeats questions, stories (family complaining): 0  Trouble using ordinary gadgets (microwave, computer, phone): 0  Forgets the month or year:  0  Mismanaging finances: 0  Remembering appts:0  Daily problems with thinking and/or memory:0 Ad8 score is=0  Unable to complete entire MMSE d/t audio only.           Immunization History  Administered Date(s) Administered  . Influenza, High Dose Seasonal PF 06/21/2017, 06/20/2018  . Influenza,inj,Quad PF,6+ Mos 05/31/2014, 06/27/2015  . Influenza-Unspecified 09/04/2016  . Pneumococcal Conjugate-13 05/18/2014  . Pneumococcal Polysaccharide-23 07/29/2015   Declines Shingrix.    Screening Tests Health Maintenance  Topic Date Due  . OPHTHALMOLOGY EXAM  08/16/2018  . INFLUENZA VACCINE  05/02/2019  . FOOT EXAM  06/21/2019  . HEMOGLOBIN A1C  08/19/2019  . TETANUS/TDAP  10/04/2019  .  COLONOSCOPY  01/20/2020  . Hepatitis C Screening  Completed  . PNA vac Low Risk Adult  Completed        Plan:    Schedule eye exam.   Bring a copy of  your living will and/or healthcare power of attorney to your next office visit.  Continue doing brain stimulating activities (puzzles, reading, adult coloring books, staying active) to keep memory sharp.    I have personally reviewed and noted the following in the patient's chart:   . Medical and social history . Use of alcohol, tobacco or illicit drugs  . Current medications and supplements . Functional ability and status . Nutritional status . Physical activity . Advanced directives . List of other physicians . Hospitalizations, surgeries, and ER visits in previous 12 months . Vitals . Screenings to include cognitive, depression, and falls . Referrals and appointments  In addition, I have reviewed and discussed with patient certain preventive protocols, quality metrics, and best practice recommendations. A written personalized care plan for preventive services as well as general preventive health recommendations were provided to patient.     Gerilyn Nestle, RN  05/15/2019  F/U with PCP 06/2019

## 2019-05-19 NOTE — Progress Notes (Signed)
AWV reviewed and agree. Signed:  Crissie Sickles, MD           05/19/2019

## 2019-05-27 ENCOUNTER — Other Ambulatory Visit: Payer: Self-pay | Admitting: Family Medicine

## 2019-05-27 NOTE — Telephone Encounter (Signed)
Contacted patient to verify he is only taking half tab of lasix.  He states that is correct.  I advised patient I would still send in # [phone] since this medication could change at his next OV.  Patient voiced understanding to continue taking it 1/2 tab daily as recommended by Dr Anitra Lauth.

## 2019-06-18 ENCOUNTER — Ambulatory Visit (INDEPENDENT_AMBULATORY_CARE_PROVIDER_SITE_OTHER): Payer: Medicare Other | Admitting: Family Medicine

## 2019-06-18 ENCOUNTER — Other Ambulatory Visit (INDEPENDENT_AMBULATORY_CARE_PROVIDER_SITE_OTHER): Payer: Medicare Other

## 2019-06-18 ENCOUNTER — Other Ambulatory Visit: Payer: Self-pay

## 2019-06-18 ENCOUNTER — Encounter: Payer: Self-pay | Admitting: Family Medicine

## 2019-06-18 VITALS — BP 156/83 | HR 60 | Temp 98.2°F | Resp 16 | Ht 65.0 in | Wt 162.0 lb

## 2019-06-18 DIAGNOSIS — E871 Hypo-osmolality and hyponatremia: Secondary | ICD-10-CM

## 2019-06-18 DIAGNOSIS — Z23 Encounter for immunization: Secondary | ICD-10-CM | POA: Diagnosis not present

## 2019-06-18 DIAGNOSIS — E118 Type 2 diabetes mellitus with unspecified complications: Secondary | ICD-10-CM

## 2019-06-18 DIAGNOSIS — N183 Chronic kidney disease, stage 3 unspecified: Secondary | ICD-10-CM

## 2019-06-18 DIAGNOSIS — I10 Essential (primary) hypertension: Secondary | ICD-10-CM | POA: Diagnosis not present

## 2019-06-18 LAB — CBC WITH DIFFERENTIAL/PLATELET
Basophils Absolute: 0 10*3/uL (ref 0.0–0.1)
Basophils Relative: 1 % (ref 0.0–3.0)
Eosinophils Absolute: 0.2 10*3/uL (ref 0.0–0.7)
Eosinophils Relative: 4.8 % (ref 0.0–5.0)
HCT: 43.6 % (ref 39.0–52.0)
Hemoglobin: 14.7 g/dL (ref 13.0–17.0)
Lymphocytes Relative: 31.4 % (ref 12.0–46.0)
Lymphs Abs: 1.5 10*3/uL (ref 0.7–4.0)
MCHC: 33.6 g/dL (ref 30.0–36.0)
MCV: 90.8 fl (ref 78.0–100.0)
Monocytes Absolute: 0.4 10*3/uL (ref 0.1–1.0)
Monocytes Relative: 7.7 % (ref 3.0–12.0)
Neutro Abs: 2.7 10*3/uL (ref 1.4–7.7)
Neutrophils Relative %: 55.1 % (ref 43.0–77.0)
Platelets: 248 10*3/uL (ref 150.0–400.0)
RBC: 4.8 Mil/uL (ref 4.22–5.81)
RDW: 14.3 % (ref 11.5–15.5)
WBC: 4.8 10*3/uL (ref 4.0–10.5)

## 2019-06-18 LAB — BASIC METABOLIC PANEL
BUN: 13 mg/dL (ref 6–23)
CO2: 26 mEq/L (ref 19–32)
Calcium: 9.2 mg/dL (ref 8.4–10.5)
Chloride: 95 mEq/L — ABNORMAL LOW (ref 96–112)
Creatinine, Ser: 1.2 mg/dL (ref 0.40–1.50)
GFR: 59.57 mL/min — ABNORMAL LOW (ref >60.00)
Glucose, Bld: 160 mg/dL — ABNORMAL HIGH (ref 70–99)
Potassium: 3.9 mEq/L (ref 3.5–5.1)
Sodium: 129 mEq/L — ABNORMAL LOW (ref 135–145)

## 2019-06-18 LAB — HEMOGLOBIN A1C: Hgb A1c MFr Bld: 6.8 % — ABNORMAL HIGH (ref 4.6–6.5)

## 2019-06-18 NOTE — Progress Notes (Signed)
OFFICE VISIT  06/18/2019   CC:  Chief Complaint  Patient presents with  . Follow-up    RCI, pt is not fasting    HPI:    Patient is a 73 y.o. Caucasian male who presents for 4 mo f/u DM2, HTN, CRI 3 (GFR @50s ). Has had mild hyponatremia and on 03/03/19 it was improved some after decreasing his lasix dose to 1/2 of 40 mg qd.  We continued him on this dose.  HTN: after months of home bp measurements being <130s/80s. He's been noticing some measurements getting back up into 130s intermittently, diast 70s, HR around 60 or so.  DM: home gluc monitoring around 100 fasting, around 130-140 2hPP in afternoons.  Feeling well. Has routine annual f/u with Dr. Clover Mealy next week.  ROS: no CP, no SOB, no wheezing, no cough, no dizziness, no HAs, no rashes, no melena/hematochezia.  No polyuria or polydipsia.  No myalgias or arthralgias.  Past Medical History:  Diagnosis Date  . Chronic prostatitis    Very mild elevation of PSA (>4), referred to Urol where PSA repeat was 1.0 on 06/16/13.  Annual PSA repeat is all that is needed now per urologist.  Most recent 07/2015 was 0.45.  Marland Kitchen Chronic renal insufficiency, stage III (moderate) (HCC)    Baseline GFR as of 2019= 50s.    . Diabetes mellitus with complication ([place]) Dx'd fall 2011  . HTN (hypertension)    Renal/aortic doppler u/s normal 06/2010  . Hypercalcemia   . Hyperlipidemia 2016   Statin intolerant.  Pt refuses any further trial of statin as of 2018.  . Nonspecific abnormal electrocardiogram (ECG) (EKG) Fall 2011   TWI in inferior leads: myocardial perfusion scan neg and echo normal 07/2010  . Tobacco dependence   . UTI (lower urinary tract infection)    Feb 2012 (klebsiella--dx at [facility]); 03/2013 e coli.     Past Surgical History:  Procedure Laterality Date  . CARDIOVASCULAR STRESS TEST  07/2010   Myocardial perfusion scan neg/low risk.  Marland Kitchen CATARACT EXTRACTION  2007, 2008   Bilateral ([facility] on [address].   . COLONOSCOPY  12/20/04;12/2014   2016 no polyps: recall 5 yrs due to [place] of colon ca  . ESOPHAGOGASTRODUODENOSCOPY  11/2004   esoph stricture/dilation  . TRANSTHORACIC ECHOCARDIOGRAM  07/2010   EF =>55%; LA mildly dilated; trace MR/TR;     Outpatient Medications Prior to Visit  Medication Sig Dispense Refill  . aspirin EC 81 MG tablet Take 81 mg by mouth daily.    . carvedilol (COREG) 6.25 MG tablet TAKE 1/2 (ONE-HALF) TABLET BY MOUTH IN THE MORNING AND 1 TABLET IN THE EVENING 135 tablet 1  . esomeprazole (NEXIUM) 20 MG capsule Take 20 mg by mouth daily at 12 noon.    . pioglitazone (ACTOS) 15 MG tablet Take 1/2 (one-half) tablet by mouth once daily 45 tablet 0  . furosemide (LASIX) 40 MG tablet Take 1 tablet by mouth once daily (Patient not taking: Reported on 06/18/2019) 90 tablet 0  . olmesartan (BENICAR) 5 MG tablet Take 1/2 (one-half) tablet by mouth once daily (Patient not taking: Reported on 06/18/2019) 45 tablet 0   No facility-administered medications prior to visit.     Allergies  Allergen Reactions  . Bactrim [Sulfamethoxazole-Trimethoprim] Nausea Only    ROS As per HPI  PE: Blood pressure (!) 156/83, pulse 60, temperature 98.2 F (36.8 C), temperature source Temporal, resp. rate 16, height 5\' 5"  (1.651 m), weight 162 lb (73.5 kg), SpO2  98 %. Body mass index is 26.96 kg/m.  Gen: Alert, well appearing.  Patient is oriented to person, place, time, and situation. AFFECT: pleasant, lucid thought and speech. CV: RRR, no m/r/g.   LUNGS: CTA bilat, nonlabored resps, good aeration in all lung fields. EXT: no clubbing or cyanosis.  no edema.    LABS:  No results found for: TSH Lab Results  Component Value Date   WBC WILL FOLLOW 03/27/2018   HGB WILL FOLLOW 03/27/2018   HCT WILL FOLLOW 03/27/2018   MCV WILL FOLLOW 03/27/2018   PLT WILL FOLLOW 03/27/2018   Lab Results  Component Value Date   CREATININE 1.19 03/03/2019   BUN 9 03/03/2019   NA 129 (L) 03/03/2019   K  4.7 03/03/2019   CL 96 03/03/2019   CO2 27 03/03/2019   Lab Results  Component Value Date   ALT 14 02/17/2019   AST 16 02/17/2019   ALKPHOS 117 02/17/2019   BILITOT 0.3 02/16/2019   Lab Results  Component Value Date   CHOL CANCELED 02/16/2019   Lab Results  Component Value Date   HDL CANCELED 02/16/2019   Lab Results  Component Value Date   LDLCALC 89 12/19/2017   Lab Results  Component Value Date   TRIG CANCELED 02/16/2019   Lab Results  Component Value Date   CHOLHDL 3 12/19/2017   Lab Results  Component Value Date   PSA 0.85 12/19/2017   PSA 0.98 09/19/2016   PSA 0.45 07/29/2015   Lab Results  Component Value Date   HGBA1C 6.5 (H) 02/16/2019    IMPRESSION AND PLAN:  1) DM 2: historically well controlled. HbA1c today.  2) HTN: bp coming back up a little intermittently over the last month. Increase benicar (olmesartan) back to a WHOLE 5mg  tab once a day. Check BP and HR once daily and we'll review these numbers at telephone visit f/u in 2 wks.  3) CRI III: BMET and CBC  today. Avoids NSAIDs, hydrates well. Has f/u with nephrologist next week->will forward lab results to her.  4) Hyponatremia: Na improved with lowering dose of lasix. BMET today and continue 1/2 of 40mg  lasix qd for now.  An After Visit Summary was printed and given to the patient.  FOLLOW UP: Return in about 2 weeks (around 07/02/2019) for TELEPHONE VISIT for f/u HTN.  If all stable at that timer then next visit will be 3 mo f/u CPE.  Signed:  Crissie Sickles, MD           06/18/2019

## 2019-06-19 ENCOUNTER — Other Ambulatory Visit: Payer: Self-pay | Admitting: Family Medicine

## 2019-06-20 ENCOUNTER — Other Ambulatory Visit: Payer: Self-pay | Admitting: Family Medicine

## 2019-06-23 DIAGNOSIS — E0822 Diabetes mellitus due to underlying condition with diabetic chronic kidney disease: Secondary | ICD-10-CM | POA: Diagnosis not present

## 2019-06-23 DIAGNOSIS — N183 Chronic kidney disease, stage 3 (moderate): Secondary | ICD-10-CM | POA: Diagnosis not present

## 2019-06-23 DIAGNOSIS — I129 Hypertensive chronic kidney disease with stage 1 through stage 4 chronic kidney disease, or unspecified chronic kidney disease: Secondary | ICD-10-CM | POA: Diagnosis not present

## 2019-06-23 DIAGNOSIS — E669 Obesity, unspecified: Secondary | ICD-10-CM | POA: Diagnosis not present

## 2019-07-03 ENCOUNTER — Ambulatory Visit: Payer: Medicare Other | Admitting: Family Medicine

## 2019-07-21 ENCOUNTER — Other Ambulatory Visit: Payer: Self-pay | Admitting: Family Medicine

## 2019-07-24 ENCOUNTER — Telehealth: Payer: Self-pay | Admitting: Family Medicine

## 2019-07-24 NOTE — Telephone Encounter (Signed)
Patient called saying that he has a running nose and sore throat. Patient says that she was seen recently for this. He does not have a fever. He is wanting to know if something could be called in for him. Please advise.

## 2019-07-24 NOTE — Telephone Encounter (Signed)
Advised patient that Dr Anitra Lauth is not in today.  Advised patient to do VV but he declined, stating he just needed a z-pack.  I told him that he can't get that without an appointment with a provider.  I told him he could contact his phar mist to see if there is an allergy pill that could help him dry up his sinus drainage.  I also advised him not to go anywhere until he feels well as his symptoms are covid symptoms.  He then states he does not have a sore throat, just drainage.  Advised him again, that is still a covid symptom and to stay home, rest, and hydrate and have someone deliver meds to him.    Patient also wanted Dr Anitra Lauth know that these are his current BP readings: 132/78 127/77   125/78  124/78 118/72    Please advise.

## 2019-07-24 NOTE — Telephone Encounter (Signed)
Sorry, I don't see anything in chart from recently about this. ?? Anyway, needs o/v before consideration of antibiotic. As far as his blood pressures go they are all excellent.-thx

## 2019-07-27 ENCOUNTER — Encounter: Payer: Self-pay | Admitting: Family Medicine

## 2019-07-27 ENCOUNTER — Ambulatory Visit (INDEPENDENT_AMBULATORY_CARE_PROVIDER_SITE_OTHER): Payer: Medicare Other | Admitting: Family Medicine

## 2019-07-27 ENCOUNTER — Other Ambulatory Visit: Payer: Self-pay

## 2019-07-27 VITALS — BP 124/71 | HR 61

## 2019-07-27 DIAGNOSIS — R05 Cough: Secondary | ICD-10-CM

## 2019-07-27 DIAGNOSIS — J069 Acute upper respiratory infection, unspecified: Secondary | ICD-10-CM

## 2019-07-27 DIAGNOSIS — R059 Cough, unspecified: Secondary | ICD-10-CM

## 2019-07-27 MED ORDER — AZITHROMYCIN 250 MG PO TABS: ORAL_TABLET | ORAL | 0 refills | Status: DC

## 2019-07-27 NOTE — Progress Notes (Addendum)
Virtual Visit via Video Note  I connected with pt on 07/27/19 at  4:00 PM EDT by telephone (video enabled telemedicine application not available to patient) and verified that I am speaking with the correct person using two identifiers.  Location patient: home Location provider:work or home office Persons participating in the virtual visit: patient, provider  I discussed the limitations of evaluation and management by telemedicine and the availability of in person appointments. The patient expressed understanding and agreed to proceed.  Telemedicine visit is a necessity given the COVID-19 restrictions in place at the current time.  HPI: 73 y/o WM being seen today for "sinus congestion". Onset 1 week ago, nasal congestion/runny nose. Some PND cough.  Some rattle/wheeze in chest but this clears up when he coughs. No SOB or fever.  No n/v/d or abd discomfort.  No ST or HA. No CP. No sx's seem to be improving.  Using saline nasal spray.  ROS: See pertinent positives and negatives per HPI.  Past Medical History:  Diagnosis Date  . Chronic prostatitis    Very mild elevation of PSA (>4), referred to Urol where PSA repeat was 1.0 on 06/16/13.  Annual PSA repeat is all that is needed now per urologist.  Most recent 07/2015 was 0.45.  Marland Kitchen Chronic renal insufficiency, stage III (moderate)    Baseline GFR as of 2019= 50s.    . Diabetes mellitus with complication ([place]) Dx'd fall 2011  . HTN (hypertension)    Renal/aortic doppler u/s normal 06/2010  . Hypercalcemia   . Hyperlipidemia 2016   Statin intolerant.  Pt refuses any further trial of statin as of 2018.  . Nonspecific abnormal electrocardiogram (ECG) (EKG) Fall 2011   TWI in inferior leads: myocardial perfusion scan neg and echo normal 07/2010  . Tobacco dependence   . UTI (lower urinary tract infection)    Feb 2012 (klebsiella--dx at [facility]); 03/2013 e coli.     Past Surgical History:  Procedure Laterality Date  . CARDIOVASCULAR STRESS  TEST  07/2010   Myocardial perfusion scan neg/low risk.  Marland Kitchen CATARACT EXTRACTION  2007, 2008   Bilateral ([facility] on [address].  . COLONOSCOPY  12/20/04;12/2014   2016 no polyps: recall 5 yrs due to [place] of colon ca  . ESOPHAGOGASTRODUODENOSCOPY  11/2004   esoph stricture/dilation  . TRANSTHORACIC ECHOCARDIOGRAM  07/2010   EF =>55%; LA mildly dilated; trace MR/TR;     Family History  Problem Relation Age of Onset  . Dementia Mother   . Pneumonia Father        died in 58's of pneumonia, was otherwise healthy  . Cancer Sister        colon cancer.  Died age 68  . Diabetes Paternal Aunt   . Diabetes Sister   . Colon cancer Neg Hx     SOCIAL HX:  Social History   Socioeconomic History  . Marital status: Married    Spouse name: Not on file  . Number of children: Not on file  . Years of education: Not on file  . Highest education level: Not on file  Occupational History  . Not on file  Social Needs  . Financial resource strain: Not on file  . Food insecurity    Worry: Not on file    Inability: Not on file  . Transportation needs    Medical: Not on file    Non-medical: Not on file  Tobacco Use  . Smoking status: Current Every Day Smoker  Packs/day: 0.50    Years: 50.00    Pack years: 25.00    Types: Cigarettes  . Smokeless tobacco: Never Used  Substance and Sexual Activity  . Alcohol use: Not Currently    Alcohol/week: 0.0 standard drinks    Comment: rare alcohol  . Drug use: No  . Sexual activity: Not on file  Lifestyle  . Physical activity    Days per week: Not on file    Minutes per session: Not on file  . Stress: Not on file  Relationships  . Social Herbalist on phone: Not on file    Gets together: Not on file    Attends religious service: Not on file    Active member of club or organization: Not on file    Attends meetings of clubs or organizations: Not on file    Relationship status: Not on file  Other Topics Concern  .  Not on file  Social History Narrative   Married, 4 grown children, 6 grandchildren.   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   Lives in [place].   25 pack-yr tobacco hx--current as of 05/2014.   Drinks about a six pack per week.  Enjoys golf--six handicap at one point.  No formal exercise.   Walks a lot for his job.           Current Outpatient Medications:  .  aspirin EC 81 MG tablet, Take 81 mg by mouth daily., Disp: , Rfl:  .  carvedilol (COREG) 6.25 MG tablet, TAKE 1/2 (ONE-HALF) TABLET BY MOUTH IN THE MORNING AND 1 IN THE EVENING, Disp: 135 tablet, Rfl: 0 .  esomeprazole (NEXIUM) 20 MG capsule, Take 20 mg by mouth daily at 12 noon., Disp: , Rfl:  .  olmesartan (BENICAR) 5 MG tablet, Take 1/2 (one-half) tablet by mouth once daily (Patient taking differently: 5 mg. ), Disp: 45 tablet, Rfl: 0 .  pioglitazone (ACTOS) 15 MG tablet, Take 1/2 (one-half) tablet by mouth once daily, Disp: 45 tablet, Rfl: 0 .  furosemide (LASIX) 40 MG tablet, Take 1 tablet by mouth once daily (Patient not taking: Reported on 06/18/2019), Disp: 90 tablet, Rfl: 0  EXAM:  VITALS per patient if applicable: BP [id number] (BP Location: Left Arm, Patient Position: Sitting, Cuff Size: Normal)   Pulse 61    GENERAL: alert, oriented, sounds well and in no acute distress Rare cough when I talked to him today. No audible wheezing. No further exam b/c audio visit only.  LABS: none today    Chemistry      Component Value Date/Time   NA 129 (L) 06/18/2019 0956   NA 126 (A) 02/17/2019   K 3.9 06/18/2019 0956   CL 95 (L) 06/18/2019 0956   CO2 26 06/18/2019 0956   BUN 13 06/18/2019 0956   BUN 12 02/17/2019   CREATININE 1.20 06/18/2019 0956   CREATININE 1.87 (H) 05/29/2012 1610   GLU 100 02/17/2019      Component Value Date/Time   CALCIUM 9.2 06/18/2019 0956   ALKPHOS 117 02/17/2019   AST 16 02/17/2019   ALT 14 02/17/2019   BILITOT 0.3 02/16/2019 0953      Lab Results  Component Value Date   HGBA1C 6.8 (H) 06/18/2019   ASSESSMENT AND PLAN:  Discussed the following assessment and plan:  URI, unspecified, not improving after 7d. Some cough but mostly due to PND. Azith eRx'd.  Continue with  saline nasal spray qid prn. Possibility of covid discussed.  He knows to quarantine for minimum of 10d from onset of sx's and needs to be fever-free and with improvement in all sx's for at least 3d.   I discussed the assessment and treatment plan with the patient. The patient was provided an opportunity to ask questions and all were answered. The patient agreed with the plan and demonstrated an understanding of the instructions.   The patient was advised to call back or seek an in-person evaluation if the symptoms worsen or if the condition fails to improve as anticipated.  Spent 10 min with pt today, with >50% of this time spent in counseling and care coordination regarding the above problems.  F/u: if not improving  Signed:  Crissie Sickles, MD           07/27/2019

## 2019-07-27 NOTE — Telephone Encounter (Signed)
Patient schedule telephone visit, no smart phone to do VV, to discuss sinus issues.

## 2019-08-14 ENCOUNTER — Telehealth: Payer: Self-pay

## 2019-08-14 MED ORDER — PREDNISONE 20 MG PO TABS: ORAL_TABLET | ORAL | 0 refills | Status: DC

## 2019-08-14 MED ORDER — BENZONATATE 200 MG PO CAPS: 200.0000 mg | ORAL_CAPSULE | Freq: Two times a day (BID) | ORAL | 0 refills | Status: DC | PRN

## 2019-08-14 NOTE — Telephone Encounter (Signed)
Called patient and he is aware of rx at pharmacy

## 2019-08-14 NOTE — Addendum Note (Signed)
Addended by: Tammi Sou on: 08/14/2019 04:38 PM   Modules accepted: Orders

## 2019-08-14 NOTE — Telephone Encounter (Signed)
Prednisone and tessalon pearls eRx'd 

## 2019-08-14 NOTE — Telephone Encounter (Signed)
Patient is still not feeling better. Patients wife said that he is coughing up "gray stuff" but doesn't have a fever. Patient does not have SOB. Cough has been going on for 3 weeks. Please advise, thank you

## 2019-09-01 ENCOUNTER — Other Ambulatory Visit: Payer: Self-pay | Admitting: Family Medicine

## 2019-09-14 ENCOUNTER — Other Ambulatory Visit: Payer: Self-pay

## 2019-09-15 ENCOUNTER — Encounter: Payer: Self-pay | Admitting: Family Medicine

## 2019-09-15 ENCOUNTER — Ambulatory Visit (INDEPENDENT_AMBULATORY_CARE_PROVIDER_SITE_OTHER): Payer: Medicare Other | Admitting: Family Medicine

## 2019-09-15 VITALS — BP 134/74 | HR 63 | Temp 98.0°F | Resp 16 | Ht 65.0 in | Wt 160.2 lb

## 2019-09-15 DIAGNOSIS — Z0000 Encounter for general adult medical examination without abnormal findings: Secondary | ICD-10-CM | POA: Diagnosis not present

## 2019-09-15 DIAGNOSIS — I10 Essential (primary) hypertension: Secondary | ICD-10-CM | POA: Diagnosis not present

## 2019-09-15 DIAGNOSIS — E118 Type 2 diabetes mellitus with unspecified complications: Secondary | ICD-10-CM

## 2019-09-15 DIAGNOSIS — N183 Chronic kidney disease, stage 3 unspecified: Secondary | ICD-10-CM

## 2019-09-15 DIAGNOSIS — Z125 Encounter for screening for malignant neoplasm of prostate: Secondary | ICD-10-CM

## 2019-09-15 LAB — COMPREHENSIVE METABOLIC PANEL
ALT: 12 U/L (ref 0–53)
AST: 15 U/L (ref 0–37)
Albumin: 4.2 g/dL (ref 3.5–5.2)
Alkaline Phosphatase: 144 U/L — ABNORMAL HIGH (ref 39–117)
BUN: 10 mg/dL (ref 6–23)
CO2: 29 mEq/L (ref 19–32)
Calcium: 9.3 mg/dL (ref 8.4–10.5)
Chloride: 93 mEq/L — ABNORMAL LOW (ref 96–112)
Creatinine, Ser: 1.25 mg/dL (ref 0.40–1.50)
GFR: 56.79 mL/min — ABNORMAL LOW (ref >60.00)
Glucose, Bld: 130 mg/dL — ABNORMAL HIGH (ref 70–99)
Potassium: 4.4 mEq/L (ref 3.5–5.1)
Sodium: 128 mEq/L — ABNORMAL LOW (ref 135–145)
Total Bilirubin: 0.5 mg/dL (ref 0.2–1.2)
Total Protein: 6.8 g/dL (ref 6.0–8.3)

## 2019-09-15 LAB — CBC WITH DIFFERENTIAL/PLATELET
Basophils Absolute: 0 10*3/uL (ref 0.0–0.1)
Basophils Relative: 0.9 % (ref 0.0–3.0)
Eosinophils Absolute: 0.2 10*3/uL (ref 0.0–0.7)
Eosinophils Relative: 4.9 % (ref 0.0–5.0)
HCT: 48 % (ref 39.0–52.0)
Hemoglobin: 16 g/dL (ref 13.0–17.0)
Lymphocytes Relative: 34.9 % (ref 12.0–46.0)
Lymphs Abs: 1.7 10*3/uL (ref 0.7–4.0)
MCHC: 33.4 g/dL (ref 30.0–36.0)
MCV: 90 fl (ref 78.0–100.0)
Monocytes Absolute: 0.4 10*3/uL (ref 0.1–1.0)
Monocytes Relative: 7.9 % (ref 3.0–12.0)
Neutro Abs: 2.5 10*3/uL (ref 1.4–7.7)
Neutrophils Relative %: 51.4 % (ref 43.0–77.0)
Platelets: 284 10*3/uL (ref 150.0–400.0)
RBC: 5.33 Mil/uL (ref 4.22–5.81)
RDW: 14.7 % (ref 11.5–15.5)
WBC: 4.9 10*3/uL (ref 4.0–10.5)

## 2019-09-15 LAB — LIPID PANEL
Cholesterol: 161 mg/dL (ref 0–200)
HDL: 52 mg/dL (ref >39.00)
LDL Cholesterol: 93 mg/dL (ref 0–99)
NonHDL: 109.27
Total CHOL/HDL Ratio: 3
Triglycerides: 82 mg/dL (ref 0.0–149.0)
VLDL: 16.4 mg/dL (ref 0.0–40.0)

## 2019-09-15 LAB — HEMOGLOBIN A1C: Hgb A1c MFr Bld: 7.1 % — ABNORMAL HIGH (ref 4.6–6.5)

## 2019-09-15 MED ORDER — CARVEDILOL 6.25 MG PO TABS: 6.2500 mg | ORAL_TABLET | Freq: Two times a day (BID) | ORAL | 3 refills | Status: DC

## 2019-09-15 MED ORDER — OLMESARTAN MEDOXOMIL 20 MG PO TABS: 20.0000 mg | ORAL_TABLET | Freq: Every day | ORAL | 3 refills | Status: DC

## 2019-09-15 MED ORDER — PIOGLITAZONE HCL 15 MG PO TABS: ORAL_TABLET | ORAL | 3 refills | Status: DC

## 2019-09-15 MED ORDER — IPRATROPIUM BROMIDE 0.03 % NA SOLN: 2.0000 | Freq: Two times a day (BID) | NASAL | 11 refills | Status: DC

## 2019-09-15 NOTE — Progress Notes (Signed)
Office Note 09/15/2019  CC:  Chief Complaint  Patient presents with  . Annual Exam    pt is fasting    HPI:  Antonio Serrano is a 73 y.o. White male who is here for annual health maintenance exam   DM: fasting glucs 100 avg.  Later in the day 150 or so. Feet->no burning, tingling, or numbness in feet.  BP: checks at home frequently: today 140-151, 79.  Only down into [id number] systolic 2-3 times in the last couple months. HR usually mid 60s avg, lowest 59.  Still with runny nose x months.  No nasal congestion. No fevers or cough.  Past Medical History:  Diagnosis Date  . Atherosclerosis of coronary artery    On chest CT->calcif in LAD and RCA  . Chronic prostatitis    Very mild elevation of PSA (>4), referred to Urol where PSA repeat was 1.0 on 06/16/13.  Annual PSA repeat is all that is needed now per urologist.  Most recent 07/2015 was 0.45.  Marland Kitchen Chronic renal insufficiency, stage III (moderate)    Baseline GFR as of 2019= 50s.    . Diabetes mellitus with complication ([place]) Dx'd fall 2011  . HTN (hypertension)    Renal/aortic doppler u/s normal 06/2010  . Hypercalcemia   . Hyperlipidemia 2016   Statin intolerant.  Pt refuses any further trial of statin as of 2018.  . Nonspecific abnormal electrocardiogram (ECG) (EKG) Fall 2011   TWI in inferior leads: myocardial perfusion scan neg and echo normal 07/2010  . Tobacco dependence   . UTI (lower urinary tract infection)    Feb 2012 (klebsiella--dx at [facility]); 03/2013 e coli.     Past Surgical History:  Procedure Laterality Date  . CARDIOVASCULAR STRESS TEST  07/2010   Myocardial perfusion scan neg/low risk.  Marland Kitchen CATARACT EXTRACTION  2007, 2008   Bilateral ([facility] on [address].  . COLONOSCOPY  12/20/04;12/2014   2016 no polyps: recall 5 yrs due to [place] of colon ca  . ESOPHAGOGASTRODUODENOSCOPY  11/2004   esoph stricture/dilation  . TRANSTHORACIC ECHOCARDIOGRAM  07/2010   EF =>55%; LA mildly dilated; trace  MR/TR;     Family History  Problem Relation Age of Onset  . Dementia Mother   . Pneumonia Father        died in 50's of pneumonia, was otherwise healthy  . Cancer Sister        colon cancer.  Died age 32  . Diabetes Paternal Aunt   . Diabetes Sister   . Colon cancer Neg Hx     Social History   Socioeconomic History  . Marital status: Married    Spouse name: Not on file  . Number of children: Not on file  . Years of education: Not on file  . Highest education level: Not on file  Occupational History  . Not on file  Tobacco Use  . Smoking status: Current Every Day Smoker    Packs/day: 0.50    Years: 50.00    Pack years: 25.00    Types: Cigarettes  . Smokeless tobacco: Never Used  Substance and Sexual Activity  . Alcohol use: Not Currently    Alcohol/week: 0.0 standard drinks    Comment: rare alcohol  . Drug use: No  . Sexual activity: Not on file  Other Topics Concern  . Not on file  Social History Narrative   Married, 4 grown children, 6 grandchildren.   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   Lives in [place].   25 pack-yr tobacco hx--current as of 05/2014.   Drinks about a six pack per week.  Enjoys golf--six handicap at one point.  No formal exercise.   Walks a lot for his job.         Social Determinants of Health   Financial Resource Strain:   . Difficulty of Paying Living Expenses: Not on file  Food Insecurity:   . Worried About Charity fundraiser in the Last Year: Not on file  . Ran Out of Food in the Last Year: Not on file  Transportation Needs:   . Lack of Transportation (Medical): Not on file  . Lack of Transportation (Non-Medical): Not on file  Physical Activity:   . Days of Exercise per Week: Not on file  . Minutes of Exercise per Session: Not on file  Stress:   . Feeling of Stress : Not on file  Social Connections:   . Frequency of Communication with Friends and Family: Not on  file  . Frequency of Social Gatherings with Friends and Family: Not on file  . Attends Religious Services: Not on file  . Active Member of Clubs or Organizations: Not on file  . Attends Archivist Meetings: Not on file  . Marital Status: Not on file  Intimate Partner Violence:   . Fear of Current or Ex-Partner: Not on file  . Emotionally Abused: Not on file  . Physically Abused: Not on file  . Sexually Abused: Not on file    Outpatient Medications Prior to Visit  Medication Sig Dispense Refill  . aspirin EC 81 MG tablet Take 81 mg by mouth daily.    Marland Kitchen esomeprazole (NEXIUM) 20 MG capsule Take 20 mg by mouth daily at 12 noon.    . furosemide (LASIX) 40 MG tablet Take 1 tablet by mouth once daily (Patient taking differently: 20 mg. ) 90 tablet 0  . carvedilol (COREG) 6.25 MG tablet TAKE 1/2 (ONE-HALF) TABLET BY MOUTH IN THE MORNING AND 1 IN THE EVENING 135 tablet 0  . olmesartan (BENICAR) 5 MG tablet Take 1 tablet (5 mg total) by mouth daily. 90 tablet 0  . pioglitazone (ACTOS) 15 MG tablet Take 1/2 (one-half) tablet by mouth once daily 90 tablet 0  . azithromycin (ZITHROMAX) 250 MG tablet 2 tabs po qd x 1d, then 1 tab po qd x 4d (Patient not taking: Reported on 09/15/2019) 6 tablet 0  . benzonatate (TESSALON) 200 MG capsule Take 1 capsule (200 mg total) by mouth 2 (two) times daily as needed for cough. (Patient not taking: Reported on 09/15/2019) 20 capsule 0  . predniSONE (DELTASONE) 20 MG tablet 2 tabs po qd x 5d, then 1 tab po qd x 5d 15 tablet 0   No facility-administered medications prior to visit.    Allergies  Allergen Reactions  . Bactrim [Sulfamethoxazole-Trimethoprim] Nausea Only    ROS Review of Systems  Constitutional: Negative for appetite change, chills, fatigue and fever.  HENT: Positive for rhinorrhea (chronic). Negative for congestion, dental problem, ear pain and sore throat.   Eyes: Negative for discharge, redness and visual disturbance.  Respiratory:  Negative for cough, chest tightness, shortness of breath and wheezing.   Cardiovascular: Negative for chest pain, palpitations and leg swelling.  Gastrointestinal: Negative for abdominal pain, blood in stool, diarrhea, nausea and vomiting.  Genitourinary: Negative for difficulty urinating, dysuria, flank pain, frequency, hematuria and urgency.  Musculoskeletal: Negative for  arthralgias, back pain, joint swelling, myalgias and neck stiffness.  Skin: Negative for pallor and rash.  Neurological: Negative for dizziness, speech difficulty, weakness and headaches.  Hematological: Negative for adenopathy. Does not bruise/bleed easily.  Psychiatric/Behavioral: Negative for confusion and sleep disturbance. The patient is not nervous/anxious.     PE;  bp repeat manual at end of visit was 134/74 Blood pressure (!) 169/84, pulse 63, temperature 98 F (36.7 C), temperature source Temporal, resp. rate 16, height 5\' 5"  (1.651 m), weight 160 lb 3.2 oz (72.7 kg), SpO2 99 %. Body mass index is 26.66 kg/m. Gen: Alert, well appearing.  Patient is oriented to person, place, time, and situation. AFFECT: pleasant, lucid thought and speech. ENT: Ears: EACs clear, normal epithelium.  TMs with good light reflex and landmarks bilaterally.  Eyes: no injection, icteris, swelling, or exudate.  EOMI, PERRLA. Nose: scant clear nasal drainage in nasal passages.  No turbinate edema/swelling. No sinus TTP. No injection or focal lesion.  Mouth: lips without lesion/swelling.  Oral mucosa pink and moist.  Dentition intact and without obvious caries or gingival swelling.  Oropharynx without erythema, exudate, or swelling.  Neck: supple/nontender.  No LAD, mass, or TM.  Carotid pulses 2+ bilaterally, without bruits. CV: RRR, no m/r/g.   LUNGS: CTA bilat, nonlabored resps, good aeration in all lung fields. ABD: soft, NT, ND, BS normal.  No hepatospenomegaly or mass.  No bruits. EXT: no clubbing, cyanosis, or edema.  Musculoskeletal:  no joint swelling, erythema, warmth, or tenderness.  ROM of all joints intact. Skin - no sores or suspicious lesions or rashes or color changes Foot exam -  no swelling, tenderness or skin or vascular lesions. Color and temperature is normal. Sensation is intact except absent to monofilament on L great toe. . Peripheral pulses are palpable. Toenails are thick.   Pertinent labs:  No results found for: TSH Lab Results  Component Value Date   WBC 4.8 06/18/2019   HGB 14.7 06/18/2019   HCT 43.6 06/18/2019   MCV 90.8 06/18/2019   PLT 248.0 06/18/2019   Lab Results  Component Value Date   CREATININE 1.20 06/18/2019   BUN 13 06/18/2019   NA 129 (L) 06/18/2019   K 3.9 06/18/2019   CL 95 (L) 06/18/2019   CO2 26 06/18/2019   Lab Results  Component Value Date   ALT 14 02/17/2019   AST 16 02/17/2019   ALKPHOS 117 02/17/2019   BILITOT 0.3 02/16/2019   Lab Results  Component Value Date   CHOL CANCELED 02/16/2019   Lab Results  Component Value Date   HDL CANCELED 02/16/2019   Lab Results  Component Value Date   LDLCALC 89 12/19/2017   Lab Results  Component Value Date   TRIG CANCELED 02/16/2019   Lab Results  Component Value Date   CHOLHDL 3 12/19/2017   Lab Results  Component Value Date   PSA 0.85 12/19/2017   PSA 0.98 09/19/2016   PSA 0.45 07/29/2015   Lab Results  Component Value Date   HGBA1C 6.8 (H) 06/18/2019    ASSESSMENT AND PLAN:   1) Uncontrolled HTN: increase coreg back to 1 6.25mg  tab bid and increase olmesartan to 10 mg dosing (1/2 of 20mg  qd dose). BMET today and repeat in 1 wk.  2) DM 2; good control. Feet exam normal today except some absent sensation on plantar surface of L big toe.  3) Health maintenance exam: Reviewed age and gender appropriate health maintenance issues (prudent diet, regular exercise, health  risks of tobacco and excessive alcohol, use of seatbelts, fire alarms in home, use of sunscreen).  Also reviewed age and gender  appropriate health screening as well as vaccine recommendations. Vaccines: flu UTD.  Shingrix discussed->he declined. Labs: HbA1c, FLP,CMET, CBC. Prostate ca screening: Shared decision making today->pt has chosen to stop prostate ca screening at this time. Colon ca screening: High risk->sister with colon cancer hx-> recall 2021 (last one was done by Dr. Deatra Ina with [facility]->no longer practicing there so he'll be reassigned.  An After Visit Summary was printed and given to the patient.  FOLLOW UP:  Return in about 2 weeks (around 09/29/2019) for telephone f/u HTN; also lab visit 1 wk (BMET).  Signed:  Crissie Sickles, MD           09/15/2019

## 2019-09-15 NOTE — Patient Instructions (Signed)

## 2019-09-22 ENCOUNTER — Other Ambulatory Visit: Payer: Self-pay

## 2019-09-22 ENCOUNTER — Ambulatory Visit (INDEPENDENT_AMBULATORY_CARE_PROVIDER_SITE_OTHER): Payer: Medicare Other

## 2019-09-22 DIAGNOSIS — I10 Essential (primary) hypertension: Secondary | ICD-10-CM

## 2019-09-23 LAB — BASIC METABOLIC PANEL
BUN: 13 mg/dL (ref 6–23)
CO2: 26 mEq/L (ref 19–32)
Calcium: 9.1 mg/dL (ref 8.4–10.5)
Chloride: 90 mEq/L — ABNORMAL LOW (ref 96–112)
Creatinine, Ser: 1.3 mg/dL (ref 0.40–1.50)
GFR: 54.27 mL/min — ABNORMAL LOW (ref >60.00)
Glucose, Bld: 149 mg/dL — ABNORMAL HIGH (ref 70–99)
Potassium: 4.3 mEq/L (ref 3.5–5.1)
Sodium: 125 mEq/L — ABNORMAL LOW (ref 135–145)

## 2019-09-29 ENCOUNTER — Ambulatory Visit (INDEPENDENT_AMBULATORY_CARE_PROVIDER_SITE_OTHER): Payer: Medicare Other | Admitting: Family Medicine

## 2019-09-29 ENCOUNTER — Other Ambulatory Visit: Payer: Self-pay

## 2019-09-29 ENCOUNTER — Encounter: Payer: Self-pay | Admitting: Family Medicine

## 2019-09-29 VITALS — BP 129/72 | HR 60 | Temp 98.1°F | Wt 162.0 lb

## 2019-09-29 DIAGNOSIS — E871 Hypo-osmolality and hyponatremia: Secondary | ICD-10-CM

## 2019-09-29 DIAGNOSIS — N183 Chronic kidney disease, stage 3 unspecified: Secondary | ICD-10-CM | POA: Diagnosis not present

## 2019-09-29 DIAGNOSIS — I10 Essential (primary) hypertension: Secondary | ICD-10-CM

## 2019-09-29 NOTE — Progress Notes (Signed)
Virtual Visit via Video Note  I connected with pt on 09/29/19 at 11:30 AM EST by telephone  and verified that I am speaking with the correct person using two identifiers.  Location patient: home Location provider:work or home office Persons participating in the virtual visit: patient, provider  I discussed the limitations of evaluation and management by telemedicine and the availability of in person appointments. The patient expressed understanding and agreed to proceed.  Telemedicine visit is a necessity given the COVID-19 restrictions in place at the current time.  HPI: 73 y/o WM with whom I am doing a telephone visit today (due to COVID-19 pandemic restrictions) for f/u HTN. A/P as of last o/v 09/15/19: "Uncontrolled HTN: increase coreg back to 1 6.25mg  tab bid and increase olmesartan to 10 mg dosing (1/2 of 20mg  qd dose). BMET today and repeat in 1 wk.".  Interim hx: Feeling very well. Home bp's more consistently 120s/70 avg, HR around 60 avg.  Na has been a little low but stable (see lab section below). Fluid intake: 60-65 oz water daily, some coffee and occ orange juice.  ROS: See pertinent positives and negatives per HPI.  Past Medical History:  Diagnosis Date  . Atherosclerosis of coronary artery    On chest CT->calcif in LAD and RCA  . Chronic prostatitis    Very mild elevation of PSA (>4), referred to Urol where PSA repeat was 1.0 on 06/16/13.  Annual PSA repeat is all that is needed now per urologist.  Most recent 07/2015 was 0.45.  Marland Kitchen Chronic renal insufficiency, stage III (moderate)    Baseline GFR as of 2019= 50s.    . Diabetes mellitus with complication ([place]) Dx'd fall 2011  . HTN (hypertension)    Renal/aortic doppler u/s normal 06/2010  . Hypercalcemia   . Hyperlipidemia 2016   Statin intolerant.  Pt refuses any further trial of statin as of 2018.  . Nonspecific abnormal electrocardiogram (ECG) (EKG) Fall 2011   TWI in inferior leads: myocardial perfusion scan  neg and echo normal 07/2010  . Tobacco dependence   . UTI (lower urinary tract infection)    Feb 2012 (klebsiella--dx at [facility]); 03/2013 e coli.     Past Surgical History:  Procedure Laterality Date  . CARDIOVASCULAR STRESS TEST  07/2010   Myocardial perfusion scan neg/low risk.  Marland Kitchen CATARACT EXTRACTION  2007, 2008   Bilateral ([facility] on [address].  . COLONOSCOPY  12/20/04;12/2014   2016 no polyps: recall 5 yrs due to [place] of colon ca  . ESOPHAGOGASTRODUODENOSCOPY  11/2004   esoph stricture/dilation  . TRANSTHORACIC ECHOCARDIOGRAM  07/2010   EF =>55%; LA mildly dilated; trace MR/TR;     Family History  Problem Relation Age of Onset  . Dementia Mother   . Pneumonia Father        died in 33's of pneumonia, was otherwise healthy  . Cancer Sister        colon cancer.  Died age 18  . Diabetes Paternal Aunt   . Diabetes Sister   . Colon cancer Neg Hx      Current Outpatient Medications:  .  aspirin EC 81 MG tablet, Take 81 mg by mouth daily., Disp: , Rfl:  .  carvedilol (COREG) 6.25 MG tablet, Take 1 tablet (6.25 mg total) by mouth 2 (two) times daily with a meal., Disp: 180 tablet, Rfl: 3 .  esomeprazole (NEXIUM) 20 MG capsule, Take 20 mg by mouth daily at 12 noon., Disp: , Rfl:  .  furosemide (LASIX) 40 MG tablet, Take 1 tablet by mouth once daily (Patient taking differently: 40 mg. ), Disp: 90 tablet, Rfl: 0 .  ipratropium (ATROVENT) 0.03 % nasal spray, Place 2 sprays into both nostrils every 12 (twelve) hours., Disp: 30 mL, Rfl: 11 .  olmesartan (BENICAR) 20 MG tablet, Take 1 tablet (20 mg total) by mouth daily., Disp: 45 tablet, Rfl: 3 .  pioglitazone (ACTOS) 15 MG tablet, Take 1/2-1 tablet by mouth once daily, Disp: 90 tablet, Rfl: 3  EXAM:  VITALS per patient if applicable: BP 99991111   Pulse 60   Temp 98.1 F (36.7 C) (Oral)   Wt 162 lb (73.5 kg)   BMI 26.96 kg/m    GENERAL: alert, oriented, sounds well and in no acute distress No further exam  b/c audio visit only.    LABS: none today    Chemistry      Component Value Date/Time   NA 125 (L) 09/22/2019 1326   NA 126 (A) 02/17/2019 0000   K 4.3 09/22/2019 1326   CL 90 (L) 09/22/2019 1326   CO2 26 09/22/2019 1326   BUN 13 09/22/2019 1326   BUN 12 02/17/2019 0000   CREATININE 1.30 09/22/2019 1326   CREATININE 1.87 (H) 05/29/2012 1610   GLU 100 02/17/2019 0000      Component Value Date/Time   CALCIUM 9.1 09/22/2019 1326   ALKPHOS 144 (H) 09/15/2019 1138   AST 15 09/15/2019 1138   ALT 12 09/15/2019 1138   BILITOT 0.5 09/15/2019 1138   BILITOT 0.3 02/16/2019 0953     Lab Results  Component Value Date   HGBA1C 7.1 (H) 09/15/2019    ASSESSMENT AND PLAN:  Discussed the following assessment and plan:  1) HTN: well controlled now. No changes.  sCr stable.  2) CRI III: stable.  Na a bit low, likely from increased water intake + use of furosemide. Encouraged him to cut back water intake to 35 oz/day.  -we discussed possible serious and likely etiologies, options for evaluation and workup, limitations of telemedicine visit vs in person visit, treatment, treatment risks and precautions. Pt prefers to treat via telemedicine empirically rather then risking or undertaking an in person visit at this moment. Patient agrees to seek prompt in person care if worsening, new symptoms arise, or if is not improving with treatment.   I discussed the assessment and treatment plan with the patient. The patient was provided an opportunity to ask questions and all were answered. The patient agreed with the plan and demonstrated an understanding of the instructions.   The patient was advised to call back or seek an in-person evaluation if the symptoms worsen or if the condition fails to improve as anticipated.  Spent 15 min with pt today, with >50% of this time spent in counseling and care coordination regarding the above problems.  F/u: 4 mo RCI  Signed:  Crissie Sickles, MD            09/29/2019

## 2019-10-06 ENCOUNTER — Other Ambulatory Visit: Payer: Self-pay | Admitting: Family Medicine

## 2019-11-09 ENCOUNTER — Ambulatory Visit: Payer: Medicare Other | Attending: Family Medicine

## 2019-11-09 DIAGNOSIS — Z23 Encounter for immunization: Secondary | ICD-10-CM

## 2019-11-09 NOTE — Progress Notes (Signed)
   Covid-19 Vaccination Clinic  Name:  Antonio Serrano    MRN: [id number] DOB: 1945-12-23  11/09/2019  [name] was observed post Covid-19 immunization for 15 minutes without incidence. He was provided with Vaccine Information Sheet and instruction to access the V-Safe system.   [name] was instructed to call 911 with any severe reactions post vaccine: Marland Kitchen Difficulty breathing  . Swelling of your face and throat  . A fast heartbeat  . A bad rash all over your body  . Dizziness and weakness    Immunizations Administered    Name Date Dose VIS Date Route   Pfizer COVID-19 Vaccine 11/09/2019  1:25 PM 0.3 mL 09/11/2019 Intramuscular   Manufacturer: [place]   Lot: [id number]   [place]: [id number]

## 2019-11-27 ENCOUNTER — Other Ambulatory Visit: Payer: Self-pay | Admitting: Family Medicine

## 2019-12-03 ENCOUNTER — Ambulatory Visit: Payer: Medicare Other | Attending: Internal Medicine

## 2019-12-03 DIAGNOSIS — Z23 Encounter for immunization: Secondary | ICD-10-CM | POA: Insufficient documentation

## 2019-12-03 NOTE — Progress Notes (Signed)
   Covid-19 Vaccination Clinic  Name:  Antonio Serrano    MRN: [id number] DOB: [birth year]  12/03/2019  [name] was observed post Covid-19 immunization for 15 minutes without incident. He was provided with Vaccine Information Sheet and instruction to access the V-Safe system.   [name] was instructed to call 911 with any severe reactions post vaccine: Marland Kitchen Difficulty breathing  . Swelling of face and throat  . A fast heartbeat  . A bad rash all over body  . Dizziness and weakness   Immunizations Administered    Name Date Dose VIS Date Route   Pfizer COVID-19 Vaccine 12/03/2019  5:57 PM 0.3 mL 09/11/2019 Intramuscular   Manufacturer: [place]   Lot: [id number]   [place]: [id number]

## 2019-12-15 ENCOUNTER — Telehealth: Payer: Self-pay

## 2019-12-15 NOTE — Telephone Encounter (Signed)
Patient's last visit was 09/29/19, advised to f/u 81mo. RCI. Rx for Olmesartan 20mg  written 09/15/19(45,3) and 5mg  dose sent 11/27/19(90,1).  Please advise, thanks.

## 2019-12-15 NOTE — Telephone Encounter (Signed)
Patient called and is confused about his blood pressure meds. And what MG he is to be taking. Patient picked up  olmesartan (BENICAR) 5 MG tablet [id number]  From pharmacy. Patient thought he was on  olmesartan (BENICAR) 20 MG tablet [id number]    Please advise. Patient can be reached at [phone]

## 2019-12-16 ENCOUNTER — Other Ambulatory Visit: Payer: Self-pay

## 2019-12-16 MED ORDER — OLMESARTAN MEDOXOMIL 20 MG PO TABS: ORAL_TABLET | ORAL | 3 refills | Status: DC

## 2019-12-16 NOTE — Telephone Encounter (Signed)
Patient contacted and advised of PCP recommendations. New Rx sent for 20mg  dose, sig directions updated.

## 2019-12-16 NOTE — Telephone Encounter (Signed)
I must have miscommunicated with him. He should be taking HALF of a 20mg  olmesartan tab every day. No 5 mg tab needed. -thx

## 2020-01-04 ENCOUNTER — Other Ambulatory Visit: Payer: Self-pay | Admitting: Family Medicine

## 2020-02-02 ENCOUNTER — Other Ambulatory Visit: Payer: Self-pay | Admitting: Family Medicine

## 2020-03-03 ENCOUNTER — Other Ambulatory Visit: Payer: Self-pay | Admitting: Family Medicine

## 2020-03-16 ENCOUNTER — Ambulatory Visit (INDEPENDENT_AMBULATORY_CARE_PROVIDER_SITE_OTHER): Payer: Medicare Other | Admitting: Family Medicine

## 2020-03-16 ENCOUNTER — Encounter: Payer: Self-pay | Admitting: Family Medicine

## 2020-03-16 ENCOUNTER — Other Ambulatory Visit: Payer: Self-pay

## 2020-03-16 VITALS — BP 131/83 | HR 52 | Temp 98.0°F | Resp 16 | Ht 65.0 in | Wt 164.0 lb

## 2020-03-16 DIAGNOSIS — Z23 Encounter for immunization: Secondary | ICD-10-CM

## 2020-03-16 DIAGNOSIS — N183 Chronic kidney disease, stage 3 unspecified: Secondary | ICD-10-CM | POA: Diagnosis not present

## 2020-03-16 DIAGNOSIS — Z0000 Encounter for general adult medical examination without abnormal findings: Secondary | ICD-10-CM

## 2020-03-16 DIAGNOSIS — T466X5A Adverse effect of antihyperlipidemic and antiarteriosclerotic drugs, initial encounter: Secondary | ICD-10-CM

## 2020-03-16 DIAGNOSIS — I10 Essential (primary) hypertension: Secondary | ICD-10-CM

## 2020-03-16 DIAGNOSIS — G720 Drug-induced myopathy: Secondary | ICD-10-CM

## 2020-03-16 DIAGNOSIS — E118 Type 2 diabetes mellitus with unspecified complications: Secondary | ICD-10-CM

## 2020-03-16 DIAGNOSIS — E871 Hypo-osmolality and hyponatremia: Secondary | ICD-10-CM | POA: Diagnosis not present

## 2020-03-16 LAB — BASIC METABOLIC PANEL
BUN: 13 mg/dL (ref 6–23)
CO2: 31 mEq/L (ref 19–32)
Calcium: 9.3 mg/dL (ref 8.4–10.5)
Chloride: 95 mEq/L — ABNORMAL LOW (ref 96–112)
Creatinine, Ser: 1.4 mg/dL (ref 0.40–1.50)
GFR: 49.76 mL/min — ABNORMAL LOW (ref >60.00)
Glucose, Bld: 118 mg/dL — ABNORMAL HIGH (ref 70–99)
Potassium: 4.5 mEq/L (ref 3.5–5.1)
Sodium: 132 mEq/L — ABNORMAL LOW (ref 135–145)

## 2020-03-16 LAB — MICROALBUMIN / CREATININE URINE RATIO
Creatinine,U: 26.6 mg/dL
Microalb Creat Ratio: 2.6 mg/g (ref 0.0–30.0)
Microalb, Ur: <0.7 mg/dL (ref 0.0–1.9)

## 2020-03-16 MED ORDER — TETANUS-DIPHTH-ACELL PERTUSSIS 5-2-15.5 LF-MCG/0.5 IM SUSP: 0.5000 mL | Freq: Once | INTRAMUSCULAR | 0 refills | Status: AC

## 2020-03-16 NOTE — Progress Notes (Signed)
OFFICE VISIT  03/16/2020   CC:  Chief Complaint  Patient presents with   Follow-up    RCI, pt is not fasting   HPI:    Patient is a 74 y.o. Caucasian male who presents for 6 mo f/u DM 2, HTN, CRI III, and chronic hyponatremia.  DM 2: taking whole 15mg  pioglit.   Fasting gluc 95-100 usually --checks 3 d/week.  Walking some, trying to stay active, plays 9 holes golf some.  HTN: taking WHOLE olmesartan 20mg  tab daily. This morning 121/70s, this is what it usually is at home in morning.  CRI: no NSAIDs. Usually takes a whole 40mg  lasix pill qd and has cut back on free water intake.  ROS: no fevers, no CP, no SOB, no wheezing, no cough, no dizziness, no HAs, no rashes, no melena/hematochezia.  No polyuria or polydipsia.  No myalgias or arthralgias.  No focal weakness, paresthesias, or tremors.  No acute vision or hearing abnormalities. No n/v/d or abd pain.  No palpitations.     Past Medical History:  Diagnosis Date   Atherosclerosis of coronary artery    On chest CT->calcif in LAD and RCA   Chronic prostatitis    Very mild elevation of PSA (>4), referred to [facility] where PSA repeat was 1.0 on 06/16/13.  Annual PSA repeat is all that is needed now per urologist.  Most recent 07/2015 was 0.45.   Chronic renal insufficiency, stage III (moderate)    Baseline GFR as of 2019= 50s.     Diabetes mellitus with complication ([facility]) Dx'd fall 2011   HTN (hypertension)    Renal/aortic doppler u/s normal 06/2010   Hypercalcemia    Hyperlipidemia 2016   Statin intolerant.  Pt refuses any further trial of statin as of 2018.   Nonspecific abnormal electrocardiogram (ECG) (EKG) Fall 2011   TWI in inferior leads: myocardial perfusion scan neg and echo normal 07/2010   Tobacco dependence    UTI (lower urinary tract infection)    Feb 2012 (klebsiella--dx at [facility]); 03/2013 e coli.     Past Surgical History:  Procedure Laterality Date   CARDIOVASCULAR STRESS TEST  07/2010    Myocardial perfusion scan neg/low risk.   CATARACT EXTRACTION  2007, 2008   Bilateral ([facility] on [address].   COLONOSCOPY  12/20/04;12/2014   2016 no polyps: recall 5 yrs due to [employer] of colon ca   ESOPHAGOGASTRODUODENOSCOPY  11/2004   esoph stricture/dilation   TRANSTHORACIC ECHOCARDIOGRAM  07/2010   EF =>55%; LA mildly dilated; trace MR/TR;     Outpatient Medications Prior to Visit  Medication Sig Dispense Refill   aspirin EC 81 MG tablet Take 81 mg by mouth daily.     carvedilol (COREG) 6.25 MG tablet Take 1 tablet (6.25 mg total) by mouth 2 (two) times daily with a meal. 180 tablet 3   esomeprazole (NEXIUM) 20 MG capsule Take 20 mg by mouth daily at 12 noon.     furosemide (LASIX) 40 MG tablet TAKE 1 TABLET BY MOUTH ONCE DAILY -  APPOINTMENT  NEEDED 15 tablet 0   olmesartan (BENICAR) 20 MG tablet Take 1/2 tablet daily. (Patient taking differently: 20 mg daily. Take 1/2 tablet daily.) 45 tablet 3   pioglitazone (ACTOS) 15 MG tablet Take 1/2-1 tablet by mouth once daily 90 tablet 3   ipratropium (ATROVENT) 0.03 % nasal spray Place 2 sprays into both nostrils every 12 (twelve) hours. (Patient not taking: Reported on 03/16/2020) 30 mL 11   No facility-administered  medications prior to visit.    Allergies  Allergen Reactions   Bactrim [Sulfamethoxazole-Trimethoprim] Nausea Only    ROS As per HPI  PE: Vitals with BMI 03/16/2020 09/29/2019 09/15/2019  Height 5\' 5"  - -  Weight 164 lbs 162 lbs -  BMI 41.93 79.02 -  Systolic 409 735 329  Diastolic 83 72 74  Pulse 52 60 -  o2 sat on RA today was 97%  Gen: Alert, well appearing.  Patient is oriented to person, place, time, and situation. AFFECT: pleasant, lucid thought and speech. CV: RRR, no m/r/g.   LUNGS: CTA bilat, nonlabored resps, good aeration in all lung fields. EXT: no clubbing or cyanosis.  no edema.    LABS:  No results found for: TSH Lab Results  Component Value Date   WBC 4.9  09/15/2019   HGB 16.0 09/15/2019   HCT 48.0 09/15/2019   MCV 90.0 09/15/2019   PLT 284.0 09/15/2019   Lab Results  Component Value Date   CREATININE 1.30 09/22/2019   BUN 13 09/22/2019   NA 125 (L) 09/22/2019   K 4.3 09/22/2019   CL 90 (L) 09/22/2019   CO2 26 09/22/2019   Lab Results  Component Value Date   ALT 12 09/15/2019   AST 15 09/15/2019   ALKPHOS 144 (H) 09/15/2019   BILITOT 0.5 09/15/2019   Lab Results  Component Value Date   CHOL 161 09/15/2019   Lab Results  Component Value Date   HDL 52.00 09/15/2019   Lab Results  Component Value Date   LDLCALC 93 09/15/2019   Lab Results  Component Value Date   TRIG 82.0 09/15/2019   Lab Results  Component Value Date   CHOLHDL 3 09/15/2019   Lab Results  Component Value Date   PSA 0.85 12/19/2017   PSA 0.98 09/19/2016   PSA 0.45 07/29/2015   Lab Results  Component Value Date   HGBA1C 7.1 (H) 09/15/2019   IMPRESSION AND PLAN:  1) DM 2 with DPN. HbA1c today--fasting glucoses sound great. Will ask his optometrist for most recent eye exam. Urine microalb/cr today. Lytes/cr today.  2) HTN: The current medical regimen is effective;  continue present plan and medications. Lytes/cr today.  3) CRI III: avoids NSAIDs. Hydrates appropriately. Lytes/cr today and will forward results to his nephrologist.  4) Chronic hyponatremia: suspect from chronic lasix use + increased free water intake. Taking 40mg  lasix qd now, dec free water to about 35 oz per day. Lytes/cr today.  5) Preventative health care:  No further prostate ca  Screening. He has received recall letter for colonoscopy (q5 yrs due to sister with colon ca.  If this one is normal then no further screening indicated.). Tdap rx sent to pharmacy today.  An After Visit Summary was printed and given to the patient.  FOLLOW UP: Return in about 6 months (around 09/15/2020) for annual CPE (fasting).  Signed:  Crissie Sickles, MD            03/16/2020

## 2020-03-16 NOTE — Addendum Note (Signed)
Addended by: Deveron Furlong D on: 03/16/2020 04:49 PM   Modules accepted: Orders

## 2020-03-17 LAB — HEMOGLOBIN A1C: Hgb A1c MFr Bld: 6.9 % — ABNORMAL HIGH (ref 4.6–6.5)

## 2020-03-18 ENCOUNTER — Other Ambulatory Visit: Payer: Self-pay | Admitting: Family Medicine

## 2020-03-28 ENCOUNTER — Encounter: Payer: Self-pay | Admitting: Family Medicine

## 2020-05-18 ENCOUNTER — Telehealth: Payer: Self-pay | Admitting: Family Medicine

## 2020-05-18 NOTE — Telephone Encounter (Signed)
Attempted to schedule AWV. Unable to LVM.  Will try at later time.  

## 2020-06-09 ENCOUNTER — Encounter: Payer: Self-pay | Admitting: Family Medicine

## 2020-06-14 ENCOUNTER — Other Ambulatory Visit: Payer: Self-pay | Admitting: Family Medicine

## 2020-06-16 ENCOUNTER — Other Ambulatory Visit: Payer: Self-pay | Admitting: Family Medicine

## 2020-06-17 ENCOUNTER — Other Ambulatory Visit: Payer: Self-pay | Admitting: Family Medicine

## 2020-08-24 ENCOUNTER — Other Ambulatory Visit: Payer: Self-pay | Admitting: Family Medicine

## 2020-09-02 ENCOUNTER — Other Ambulatory Visit: Payer: Self-pay | Admitting: Family Medicine

## 2020-09-04 ENCOUNTER — Encounter (HOSPITAL_BASED_OUTPATIENT_CLINIC_OR_DEPARTMENT_OTHER): Payer: Self-pay | Admitting: Emergency Medicine

## 2020-09-04 ENCOUNTER — Emergency Department (HOSPITAL_BASED_OUTPATIENT_CLINIC_OR_DEPARTMENT_OTHER)
Admission: EM | Admit: 2020-09-04 | Discharge: 2020-09-04 | Disposition: A | Discharge status: Home / Self Care | Payer: Medicare Other | Attending: Emergency Medicine | Admitting: Emergency Medicine

## 2020-09-04 ENCOUNTER — Emergency Department (HOSPITAL_BASED_OUTPATIENT_CLINIC_OR_DEPARTMENT_OTHER): Payer: Medicare Other

## 2020-09-04 DIAGNOSIS — E1122 Type 2 diabetes mellitus with diabetic chronic kidney disease: Secondary | ICD-10-CM | POA: Diagnosis not present

## 2020-09-04 DIAGNOSIS — Z79899 Other long term (current) drug therapy: Secondary | ICD-10-CM | POA: Insufficient documentation

## 2020-09-04 DIAGNOSIS — S82832A Other fracture of upper and lower end of left fibula, initial encounter for closed fracture: Secondary | ICD-10-CM | POA: Insufficient documentation

## 2020-09-04 DIAGNOSIS — I129 Hypertensive chronic kidney disease with stage 1 through stage 4 chronic kidney disease, or unspecified chronic kidney disease: Secondary | ICD-10-CM | POA: Insufficient documentation

## 2020-09-04 DIAGNOSIS — X501XXA Overexertion from prolonged static or awkward postures, initial encounter: Secondary | ICD-10-CM | POA: Insufficient documentation

## 2020-09-04 DIAGNOSIS — M25572 Pain in left ankle and joints of left foot: Secondary | ICD-10-CM | POA: Diagnosis not present

## 2020-09-04 DIAGNOSIS — F17210 Nicotine dependence, cigarettes, uncomplicated: Secondary | ICD-10-CM | POA: Diagnosis not present

## 2020-09-04 DIAGNOSIS — Z7982 Long term (current) use of aspirin: Secondary | ICD-10-CM | POA: Diagnosis not present

## 2020-09-04 DIAGNOSIS — N183 Chronic kidney disease, stage 3 unspecified: Secondary | ICD-10-CM | POA: Diagnosis not present

## 2020-09-04 DIAGNOSIS — S99912A Unspecified injury of left ankle, initial encounter: Secondary | ICD-10-CM | POA: Diagnosis present

## 2020-09-04 IMAGING — DX DG ANKLE COMPLETE 3+V*L*
3 series · 3 of 3 positions shown · non-contrast
Comparison: None.

CLINICAL DATA: Left ankle pain following twisting injury 7 days ago
with lateral bruising, initial encounter

EXAM:
LEFT ANKLE COMPLETE - 3+ VIEW

[ankle ap]
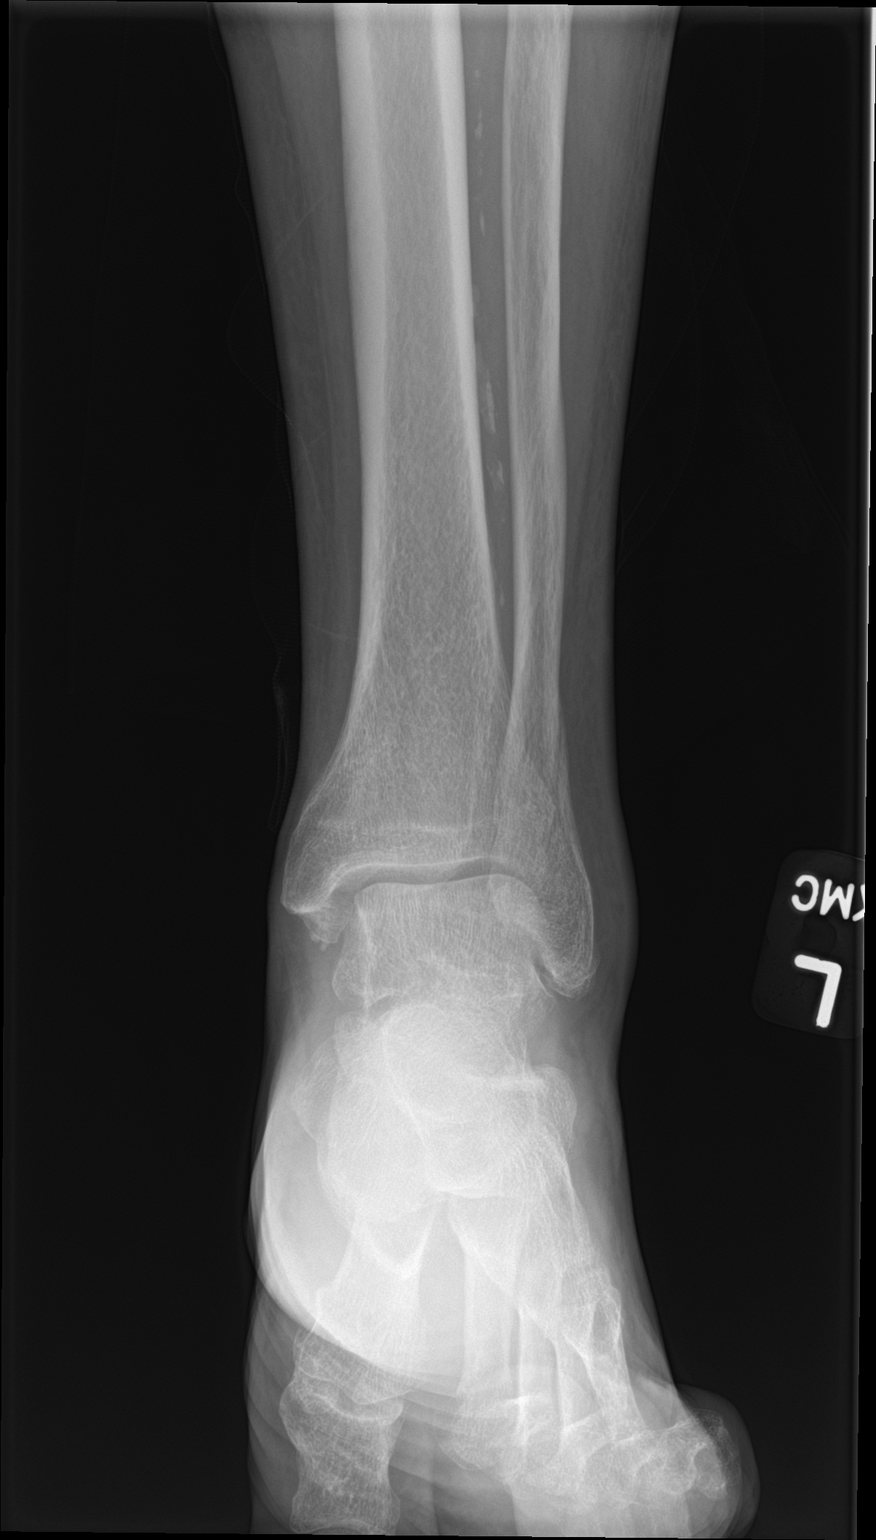

[ankle obl]
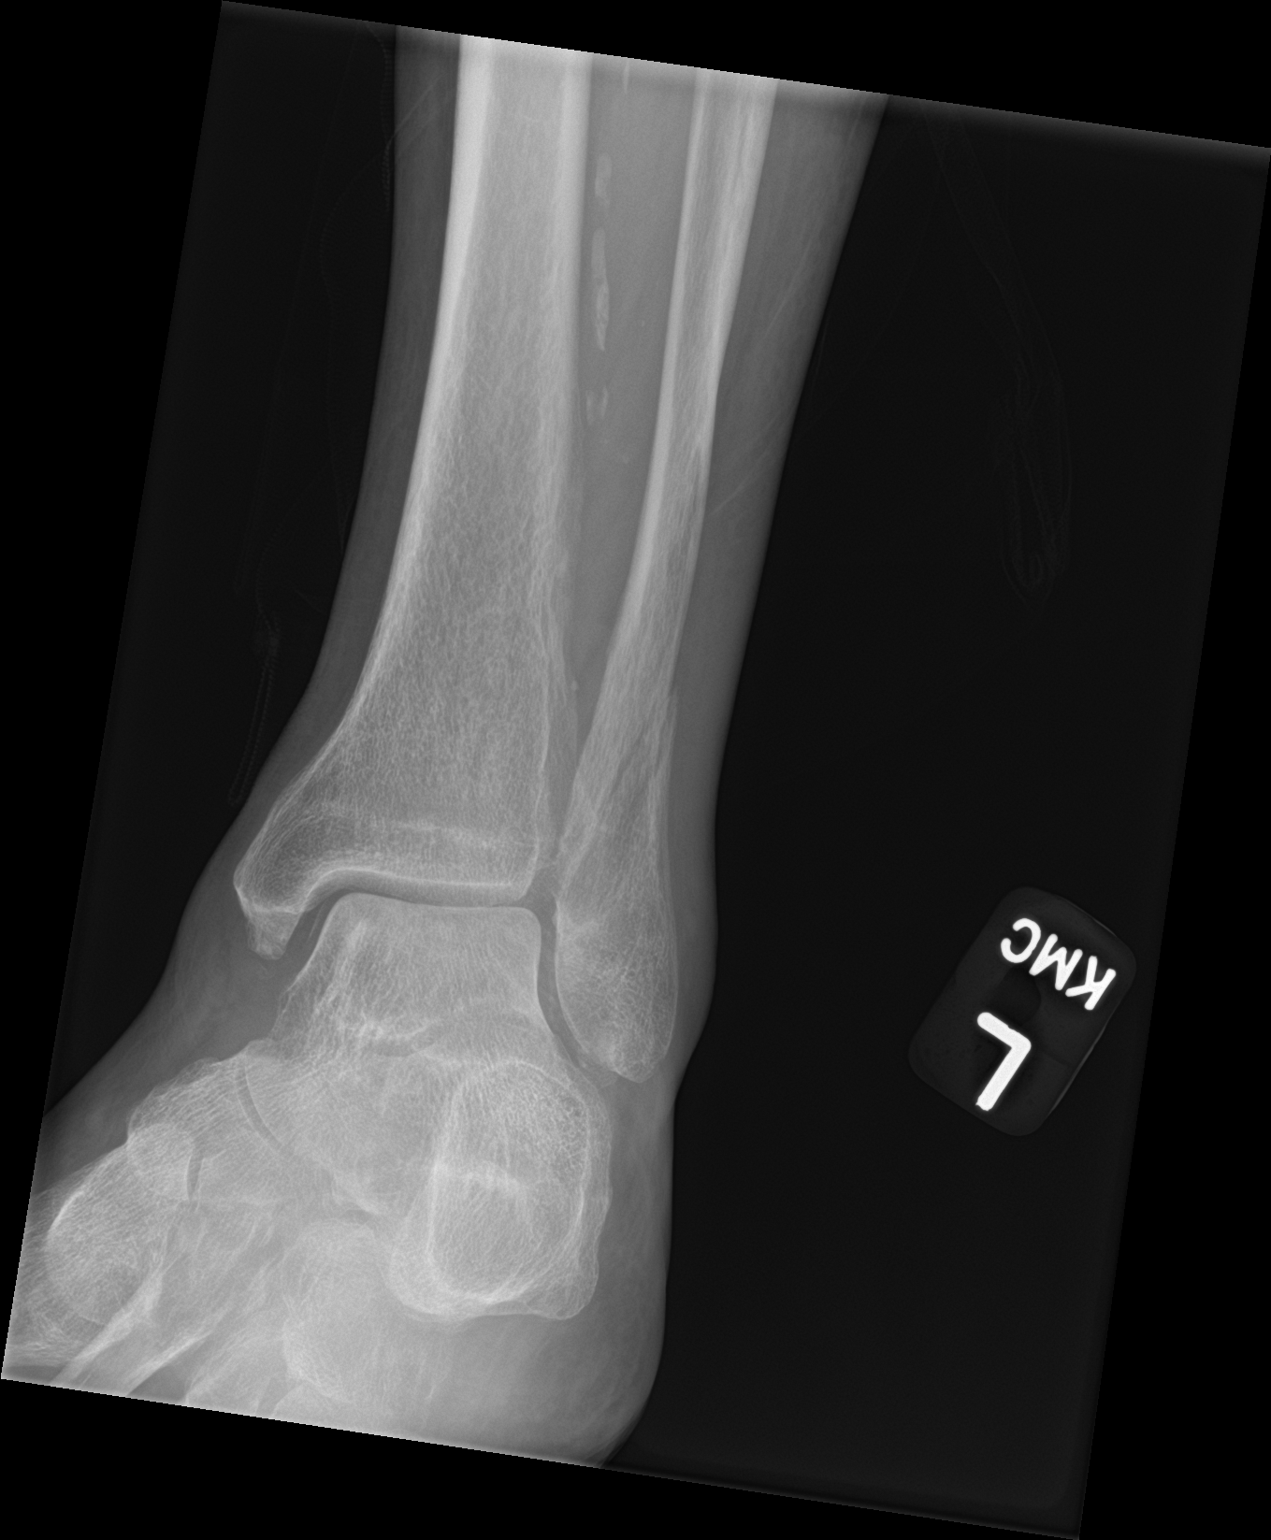

[ankle lat]
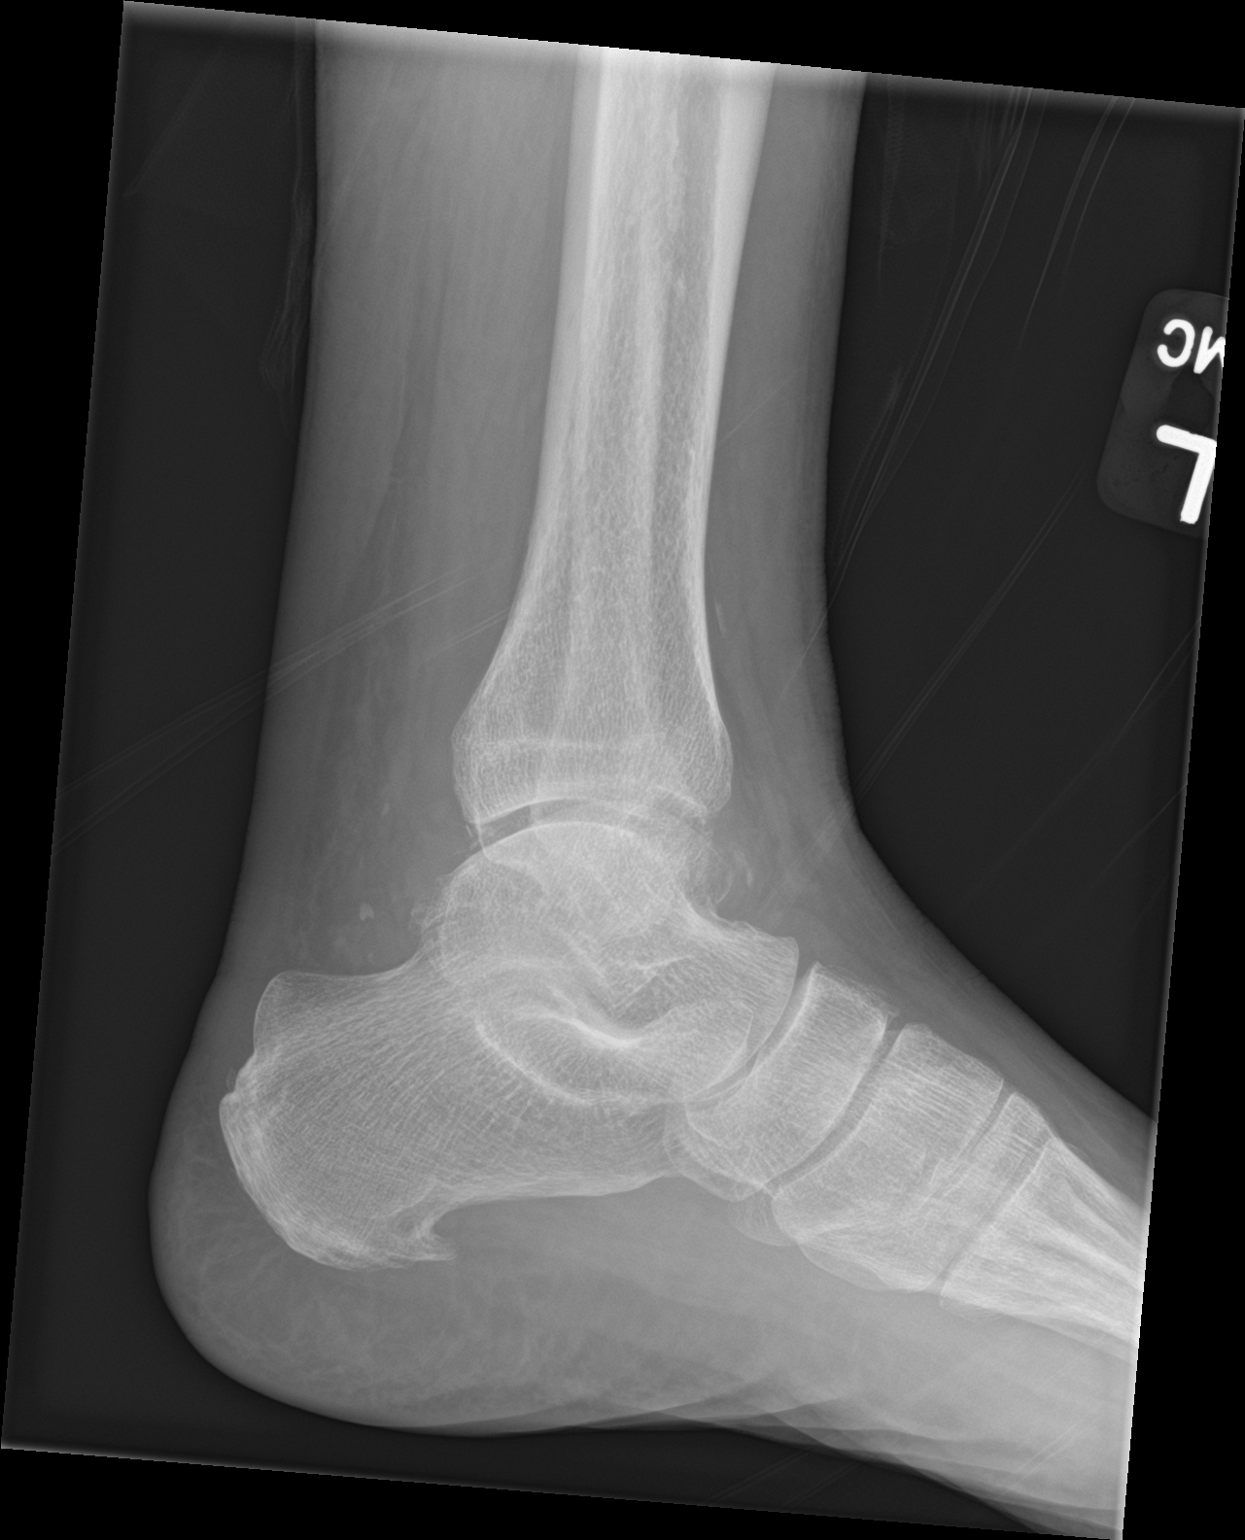

[3 of 3 positions shown; findings below may reference images not displayed]

FINDINGS: Oblique fracture is noted through the distal fibular metaphysis with
mild displacement. No tibial fracture is seen. Calcaneal spurring is
noted. Mild lateral soft tissue swelling is seen.
IMPRESSION: Distal fibular fracture with mild soft tissue swelling.

## 2020-09-04 NOTE — Discharge Instructions (Addendum)
Call the number circled above to schedule an appointment in 1-2 weeks with Dr. Mardelle Matte.  He is an orthopedic doctor in [place].  You have a broken bone (the fibula) in your left ankle.  We put on a cast in the ER.  Do not take this off until you are seen by an orthopedic doctor.  Keep this dry at home.    You should try to avoid putting weight on your left foot, and use the crutches at home to get around.  Keep your left leg raised whenever possible (on the couch).  You can take tylenol for pain.

## 2020-09-04 NOTE — ED Triage Notes (Signed)
Pt arrives pov with c/o left ankle pain and swelling after twisting ankle x 7 days pta. Lateral bruising and tenderness noted. Pain increases with ambulation

## 2020-09-04 NOTE — ED Provider Notes (Signed)
[facility] EMERGENCY DEPARTMENT Provider Note   CSN: [id number] Arrival date & time: 09/04/20  1206     History Chief Complaint  Patient presents with  . Ankle Injury    Antonio Serrano is a 74 y.o. male present emergency department left left ankle pain after twisting injury 7 days ago.  Was mechanical injury.  He reports he has been walking on his leg but has pain with ambulation.  No numbness in his foot.  He is not on blood thinners.  No fall or head injury.  Pain is 5/10, located in lower leg only, does not radiate, worse with bearing weight, better when at rest, never had it before.    HPI     Past Medical History:  Diagnosis Date  . Atherosclerosis of coronary artery    On chest CT->calcif in LAD and RCA  . Chronic prostatitis    Very mild elevation of PSA (>4), referred to [facility] where PSA repeat was 1.0 on 06/16/13.  Annual PSA repeat is all that is needed now per urologist.  Most recent 07/2015 was 0.45.  Marland Kitchen Chronic renal insufficiency, stage III (moderate) ([facility])    Baseline GFR as of 2019= 50s.    . Diabetes mellitus with complication ([facility]) Dx'd fall 2011  . HTN (hypertension)    Renal/aortic doppler u/s normal 06/2010  . Hypercalcemia   . Hyperlipidemia 2016   Statin intolerant--myalgias.  Pt refuses any further trial of statin as of 2018.  . Nonspecific abnormal electrocardiogram (ECG) (EKG) Fall 2011   TWI in inferior leads: myocardial perfusion scan neg and echo normal 07/2010  . Tobacco dependence   . UTI (lower urinary tract infection)    Feb 2012 (klebsiella--dx at [facility]); 03/2013 e coli.     Patient Active Problem List   Diagnosis Date Noted  . Diabetes mellitus with complication ([place]) 62/13/0865  . Hyperlipidemia 07/29/2015  . Conjunctivitis 06/27/2015  . Healthcare maintenance 06/27/2015  . Type II diabetes mellitus with renal manifestations ([place]) 07/16/2014  . Constipation, chronic 07/16/2014  . Preventative health care 05/18/2014  .  Testicular pain, right 06/25/2013  . Erectile dysfunction 05/31/2013  . Chronic renal insufficiency, stage III (moderate) ([facility])   . HTN (hypertension), benign 01/30/2012  . Tobacco dependence 01/30/2012    Past Surgical History:  Procedure Laterality Date  . CARDIOVASCULAR STRESS TEST  07/2010   Myocardial perfusion scan neg/low risk.  Marland Kitchen CATARACT EXTRACTION  2007, 2008   Bilateral ([facility] on [address].  . COLONOSCOPY  12/20/04;12/2014   2016 no polyps: recall 5 yrs due to [place] of colon ca  . ESOPHAGOGASTRODUODENOSCOPY  11/2004   esoph stricture/dilation  . TRANSTHORACIC ECHOCARDIOGRAM  07/2010   EF =>55%; LA mildly dilated; trace MR/TR;        Family History  Problem Relation Age of Onset  . Dementia Mother   . Pneumonia Father        died in 17's of pneumonia, was otherwise healthy  . Cancer Sister        colon cancer.  Died age 46  . Diabetes Paternal Aunt   . Diabetes Sister   . Colon cancer Neg Hx     Social History   Tobacco Use  . Smoking status: Current Every Day Smoker    Packs/day: 0.50    Years: 50.00    Pack years: 25.00    Types: Cigarettes  . Smokeless tobacco: Never Used  Vaping Use  . Vaping Use: Never used  Substance  Use Topics  . Alcohol use: Not Currently    Alcohol/week: 0.0 standard drinks    Comment: rare alcohol  . Drug use: No    Home Medications Prior to Admission medications   Medication Sig Start Date End Date Taking? Authorizing Provider  aspirin EC 81 MG tablet Take 81 mg by mouth daily.    [provider]  carvedilol (COREG) 6.25 MG tablet TAKE 1 TABLET BY MOUTH TWICE DAILY WITH A MEAL 06/17/20   [name], Adrian Blackwater, MD  esomeprazole (NEXIUM) 20 MG capsule Take 20 mg by mouth daily at 12 noon.    [provider]  furosemide (LASIX) 40 MG tablet TAKE 1 TABLET BY MOUTH ONCE DAILY -  APPOINTMENT  NEEDED 03/18/20   [name], Adrian Blackwater, MD  ipratropium (ATROVENT) 0.03 % nasal spray Place 2 sprays into  both nostrils every 12 (twelve) hours. Patient not taking: Reported on 03/16/2020 09/15/19   Tammi Sou, MD  olmesartan (BENICAR) 20 MG tablet Take 1/2 (one-half) tablet by mouth once daily 09/02/20   [name], Adrian Blackwater, MD  pioglitazone (ACTOS) 15 MG tablet TAKE 1/2 TO 1 (ONE-HALF TO ONE) TABLET BY MOUTH ONCE DAILY 08/24/20   [name], Adrian Blackwater, MD    Allergies    Bactrim [sulfamethoxazole-trimethoprim]  Review of Systems   Review of Systems  Constitutional: Negative for chills and fever.  Respiratory: Negative for cough and shortness of breath.   Cardiovascular: Negative for chest pain and palpitations.  Musculoskeletal: Positive for arthralgias and myalgias.  Skin: Negative for color change and rash.  Neurological: Negative for weakness, numbness and headaches.  Psychiatric/Behavioral: Negative for agitation and confusion.  All other systems reviewed and are negative.   Physical Exam Updated Vital Signs BP 136/84 (BP Location: Right Arm)   Pulse (!) 58   Temp 98 F (36.7 C) (Oral)   Resp 16   Ht 5\' 6"  (1.676 m)   Wt 74.4 kg   SpO2 100%   BMI 26.47 kg/m   Physical Exam Vitals and nursing note reviewed.  Constitutional:      Appearance: He is well-developed.  HENT:     Head: Normocephalic and atraumatic.  Eyes:     Conjunctiva/sclera: Conjunctivae normal.  Cardiovascular:     Rate and Rhythm: Normal rate and regular rhythm.     Pulses: Normal pulses.  Pulmonary:     Effort: Pulmonary effort is normal. No respiratory distress.     Breath sounds: Normal breath sounds.  Musculoskeletal:     Cervical back: Neck supple.     Comments: Left lateral malleolar ttp on exam  Skin:    General: Skin is warm and dry.  Neurological:     General: No focal deficit present.     Mental Status: He is alert and oriented to person, place, and time.  Psychiatric:        Mood and Affect: Mood normal.        Behavior: Behavior normal.     ED Results / Procedures / Treatments    Labs (all labs ordered are listed, but only abnormal results are displayed) Labs Reviewed - No data to display  EKG None  Radiology DG Ankle Complete Left  Result Date: 09/04/2020 CLINICAL DATA:  Left ankle pain following twisting injury 7 days ago with lateral bruising, initial encounter EXAM: LEFT ANKLE COMPLETE - 3+ VIEW COMPARISON:  None. FINDINGS: Oblique fracture is noted through the distal fibular metaphysis with mild displacement. No tibial fracture is seen. Calcaneal spurring  is noted. Mild lateral soft tissue swelling is seen. IMPRESSION: Distal fibular fracture with mild soft tissue swelling. Electronically Signed   By: Inez Catalina M.D.   On: 09/04/2020 12:50    Procedures Procedures (including critical care time)  Medications Ordered in ED Medications - No data to display  ED Course  I have reviewed the triage vital signs and the nursing notes.  Pertinent labs & imaging results that were available during my care of the patient were reviewed by me and considered in my medical decision making (see chart for details).  74 yo male here with mechanical injury and pain in ankle, found to have left distal fibula fx, closed.  Neurovascularly intact.  No other injuries noted on exam.  We'll place a leg cast, give crutches, can make NWB (he is strong enough to use these crutches), and have him f/u with ortho.  His daughter in law is present at bedside to take him home and both are in agreement with this workup plan.  In the ED his pain is minimal and under control.      Final Clinical Impression(s) / ED Diagnoses Final diagnoses:  Closed fracture of distal end of left fibula, unspecified fracture morphology, initial encounter    Rx / DC Orders ED Discharge Orders    None       Wyvonnia Dusky, MD 09/05/20 1050

## 2020-09-09 DIAGNOSIS — S8265XA Nondisplaced fracture of lateral malleolus of left fibula, initial encounter for closed fracture: Secondary | ICD-10-CM | POA: Diagnosis not present

## 2020-09-14 ENCOUNTER — Other Ambulatory Visit: Payer: Self-pay | Admitting: Family Medicine

## 2020-09-15 ENCOUNTER — Other Ambulatory Visit: Payer: Self-pay

## 2020-09-16 ENCOUNTER — Encounter: Payer: Self-pay | Admitting: Family Medicine

## 2020-09-16 ENCOUNTER — Ambulatory Visit (INDEPENDENT_AMBULATORY_CARE_PROVIDER_SITE_OTHER): Payer: Medicare Other | Admitting: Family Medicine

## 2020-09-16 VITALS — BP 158/78 | HR 50 | Temp 97.6°F | Resp 16 | Ht 65.0 in | Wt 165.4 lb

## 2020-09-16 DIAGNOSIS — N183 Chronic kidney disease, stage 3 unspecified: Secondary | ICD-10-CM

## 2020-09-16 DIAGNOSIS — E7800 Pure hypercholesterolemia, unspecified: Secondary | ICD-10-CM | POA: Diagnosis not present

## 2020-09-16 DIAGNOSIS — Z0000 Encounter for general adult medical examination without abnormal findings: Secondary | ICD-10-CM | POA: Diagnosis not present

## 2020-09-16 DIAGNOSIS — Z23 Encounter for immunization: Secondary | ICD-10-CM | POA: Diagnosis not present

## 2020-09-16 DIAGNOSIS — E119 Type 2 diabetes mellitus without complications: Secondary | ICD-10-CM | POA: Diagnosis not present

## 2020-09-16 DIAGNOSIS — Z789 Other specified health status: Secondary | ICD-10-CM

## 2020-09-16 DIAGNOSIS — I10 Essential (primary) hypertension: Secondary | ICD-10-CM | POA: Diagnosis not present

## 2020-09-16 LAB — COMPREHENSIVE METABOLIC PANEL
ALT: 15 U/L (ref 0–53)
AST: 17 U/L (ref 0–37)
Albumin: 4.1 g/dL (ref 3.5–5.2)
Alkaline Phosphatase: 92 U/L (ref 39–117)
BUN: 14 mg/dL (ref 6–23)
CO2: 28 mEq/L (ref 19–32)
Calcium: 9 mg/dL (ref 8.4–10.5)
Chloride: 97 mEq/L (ref 96–112)
Creatinine, Ser: 1.26 mg/dL (ref 0.40–1.50)
GFR: 56.82 mL/min — ABNORMAL LOW (ref >60.00)
Glucose, Bld: 103 mg/dL — ABNORMAL HIGH (ref 70–99)
Potassium: 3.7 mEq/L (ref 3.5–5.1)
Sodium: 132 mEq/L — ABNORMAL LOW (ref 135–145)
Total Bilirubin: 0.5 mg/dL (ref 0.2–1.2)
Total Protein: 6.5 g/dL (ref 6.0–8.3)

## 2020-09-16 LAB — HEMOGLOBIN A1C: Hgb A1c MFr Bld: 6.7 % — ABNORMAL HIGH (ref 4.6–6.5)

## 2020-09-16 MED ORDER — TETANUS-DIPHTH-ACELL PERTUSSIS 5-2-15.5 LF-MCG/0.5 IM SUSP: 0.5000 mL | Freq: Once | INTRAMUSCULAR | 0 refills | Status: AC

## 2020-09-16 NOTE — Progress Notes (Signed)
Office Note 09/16/2020  CC:  Chief Complaint  Patient presents with  . Annual Exam    Pt is not fasting    HPI:  Antonio Serrano is a 74 y.o. White male who is here for annual health maintenance exam and f/u DM, HTN, HLD, and CRI III. A/P as of last visit: "1) DM 2 with DPN. HbA1c today--fasting glucoses sound great. Will ask his optometrist for most recent eye exam. Urine microalb/cr today. Lytes/cr today.  2) HTN: The current medical regimen is effective;  continue present plan and medications. Lytes/cr today.  3) CRI III: avoids NSAIDs. Hydrates appropriately. Lytes/cr today and will forward results to his nephrologist.  4) Chronic hyponatremia: suspect from chronic lasix use + increased free water intake. Taking 40mg  lasix qd now, dec free water to about 35 oz per day. Lytes/cr today.  5) Preventative health care:  No further prostate ca  Screening. He has received recall letter for colonoscopy (q5 yrs due to sister with colon ca.  If this one is normal then no further screening indicated.). Tdap rx sent to pharmacy today."  INTERIM HX: Fractured L fibula about 2-3 wks ago, saw ortho with [facility] and no surgery recommended, pt wearing cast and walking splint and has f/u 10/07/20.  Taking tylenol prn.  Says it is getting better daily.  Smoking cigs some still--about 1/3 pack/day.  DM: fasting 90-110 No burning, tingling, or numbness in feet.  HTN: home bp's consistently 130/80 or better.  HLD:  Statin intol and pt not wanting any further trial of statin OR any other cholesterol med.   CRI III: avoids NSAIDs.   Drinks 32 oz water per day, limited by me due to his hx of hyponatremia in the setting of daily lasix use.  ROS: no fevers, no CP, no SOB, no wheezing, no cough, no dizziness, no HAs, no rashes, no melena/hematochezia.  No polyuria or polydipsia.  No myalgias or arthralgias.  No focal weakness, paresthesias, or tremors.  No acute vision or hearing  abnormalities. No n/v/d or abd pain.  No palpitations.     Past Medical History:  Diagnosis Date  . Atherosclerosis of coronary artery    On chest CT->calcif in LAD and RCA  . Chronic prostatitis    Very mild elevation of PSA (>4), referred to [facility] where PSA repeat was 1.0 on 06/16/13.  Annual PSA repeat is all that is needed now per urologist.  Most recent 07/2015 was 0.45.  Marland Kitchen Chronic renal insufficiency, stage III (moderate) (HCC)    Baseline GFR as of 2019= 50s.    . Diabetes mellitus with complication ([place]) Dx'd fall 2011  . HTN (hypertension)    Renal/aortic doppler u/s normal 06/2010  . Hypercalcemia   . Hyperlipidemia 2016   Statin intolerant--myalgias.  Pt refuses any further trial of statin as of 2018.  . Nonspecific abnormal electrocardiogram (ECG) (EKG) Fall 2011   TWI in inferior leads: myocardial perfusion scan neg and echo normal 07/2010  . Tobacco dependence   . UTI (lower urinary tract infection)    Feb 2012 (klebsiella--dx at [facility]); 03/2013 e coli.     Past Surgical History:  Procedure Laterality Date  . CARDIOVASCULAR STRESS TEST  07/2010   Myocardial perfusion scan neg/low risk.  Marland Kitchen CATARACT EXTRACTION  2007, 2008   Bilateral ([facility] on [address].  . COLONOSCOPY  12/20/04;12/2014   2016 no polyps: recall 5 yrs due to [place] of colon ca  . ESOPHAGOGASTRODUODENOSCOPY  11/2004  esoph stricture/dilation  . TRANSTHORACIC ECHOCARDIOGRAM  07/2010   EF =>55%; LA mildly dilated; trace MR/TR;     Family History  Problem Relation Age of Onset  . Dementia Mother   . Pneumonia Father        died in 70's of pneumonia, was otherwise healthy  . Cancer Sister        colon cancer.  Died age 57  . Diabetes Paternal Aunt   . Diabetes Sister   . Colon cancer Neg Hx     Social History   Socioeconomic History  . Marital status: Married    Spouse name: Not on file  . Number of children: Not on file  . Years of education: Not on file  . Highest  education level: Not on file  Occupational History  . Not on file  Tobacco Use  . Smoking status: Current Every Day Smoker    Packs/day: 0.50    Years: 50.00    Pack years: 25.00    Types: Cigarettes  . Smokeless tobacco: Never Used  Vaping Use  . Vaping Use: Never used  Substance and Sexual Activity  . Alcohol use: Not Currently    Alcohol/week: 0.0 standard drinks    Comment: rare alcohol  . Drug use: No  . Sexual activity: Not on file  Other Topics Concern  . Not on file  Social History Narrative   Married, 4 grown children, 6 grandchildren.   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   Lives in [place].   25 pack-yr tobacco hx--current as of 05/2014.   Drinks about a six pack per week.  Enjoys golf--six handicap at one point.  No formal exercise.   Walks a lot for his job.         Social Determinants of Health   Financial Resource Strain: Not on file  Food Insecurity: Not on file  Transportation Needs: Not on file  Physical Activity: Not on file  Stress: Not on file  Social Connections: Not on file  Intimate Partner Violence: Not on file    Outpatient Medications Prior to Visit  Medication Sig Dispense Refill  . aspirin EC 81 MG tablet Take 81 mg by mouth daily.    . carvedilol (COREG) 6.25 MG tablet TAKE 1 TABLET BY MOUTH TWICE DAILY WITH A MEAL 180 tablet 0  . esomeprazole (NEXIUM) 20 MG capsule Take 20 mg by mouth daily at 12 noon.    . furosemide (LASIX) 40 MG tablet TAKE 1 TABLET BY MOUTH ONCE DAILY . APPOINTMENT REQUIRED FOR FUTURE REFILLS 30 tablet 0  . olmesartan (BENICAR) 20 MG tablet Take 1/2 (one-half) tablet by mouth once daily 45 tablet 0  . pioglitazone (ACTOS) 15 MG tablet TAKE 1/2 TO 1 (ONE-HALF TO ONE) TABLET BY MOUTH ONCE DAILY 90 tablet 1  . ipratropium (ATROVENT) 0.03 % nasal spray Place 2 sprays into both nostrils every 12 (twelve) hours. (Patient not taking: No sig reported) 30 mL 11    No facility-administered medications prior to visit.    Allergies  Allergen Reactions  . Bactrim [Sulfamethoxazole-Trimethoprim] Nausea Only   ROS Review of Systems  Constitutional: Negative for appetite change, chills, fatigue and fever.  HENT: Negative for congestion, dental problem, ear pain and sore throat.   Eyes: Negative for discharge, redness and visual disturbance.  Respiratory: Negative for cough, chest tightness, shortness of breath and wheezing.   Cardiovascular: Negative for chest pain, palpitations  and leg swelling.  Gastrointestinal: Negative for abdominal pain, blood in stool, diarrhea, nausea and vomiting.  Genitourinary: Negative for difficulty urinating, dysuria, flank pain, frequency, hematuria and urgency.  Musculoskeletal: Negative for arthralgias, back pain, joint swelling, myalgias and neck stiffness.  Skin: Negative for pallor and rash.  Neurological: Negative for dizziness, speech difficulty, weakness and headaches.  Hematological: Negative for adenopathy. Does not bruise/bleed easily.  Psychiatric/Behavioral: Negative for confusion and sleep disturbance. The patient is not nervous/anxious.     PE; Vitals with BMI 09/16/2020 09/04/2020 09/04/2020  Height 5\' 5"  - 5\' 6"   Weight 165 lbs 6 oz - 164 lbs  BMI 18.84 - 16.60  Systolic 630 160 -  Diastolic 78 84 -  Pulse 50 58 -     Gen: Alert, well appearing.  Patient is oriented to person, place, time, and situation. AFFECT: pleasant, lucid thought and speech. ENT: Ears: EACs clear, normal epithelium.  TMs with good light reflex and landmarks bilaterally.  Eyes: no injection, icteris, swelling, or exudate.  EOMI, PERRLA. Nose: no drainage or turbinate edema/swelling.  No injection or focal lesion.  Mouth: lips without lesion/swelling.  Oral mucosa pink and moist.  Dentition intact and without obvious caries or gingival swelling.  Oropharynx without erythema, exudate, or swelling.  Neck: supple/nontender.  No  LAD, mass, or TM.  Carotid pulses 2+ bilaterally, without bruits. CV: RRR, no m/r/g.   LUNGS: CTA bilat, nonlabored resps, good aeration in all lung fields. ABD: soft, NT, ND, BS normal.  No hepatospenomegaly or mass.  No bruits. EXT: no clubbing, cyanosis, or edema.  Musculoskeletal: no joint swelling, erythema, warmth, or tenderness.  ROM of all joints intact. L ankle in hard cast. Skin - no sores or suspicious lesions or rashes or color changes Foot exam -  no swelling, tenderness or skin or vascular lesions. Color and temperature is normal. Sensation is intact. Peripheral pulses are palpable. Toenails are normal.   Pertinent labs:  No results found for: TSH Lab Results  Component Value Date   WBC 4.9 09/15/2019   HGB 16.0 09/15/2019   HCT 48.0 09/15/2019   MCV 90.0 09/15/2019   PLT 284.0 09/15/2019   Lab Results  Component Value Date   CREATININE 1.40 03/16/2020   BUN 13 03/16/2020   NA 132 (L) 03/16/2020   K 4.5 03/16/2020   CL 95 (L) 03/16/2020   CO2 31 03/16/2020   Lab Results  Component Value Date   ALT 12 09/15/2019   AST 15 09/15/2019   ALKPHOS 144 (H) 09/15/2019   BILITOT 0.5 09/15/2019   Lab Results  Component Value Date   CHOL 161 09/15/2019   Lab Results  Component Value Date   HDL 52.00 09/15/2019   Lab Results  Component Value Date   LDLCALC 93 09/15/2019   Lab Results  Component Value Date   TRIG 82.0 09/15/2019   Lab Results  Component Value Date   CHOLHDL 3 09/15/2019   Lab Results  Component Value Date   PSA 0.85 12/19/2017   PSA 0.98 09/19/2016   PSA 0.45 07/29/2015   Lab Results  Component Value Date   HGBA1C 6.9 (H) 03/16/2020   ASSESSMENT AND PLAN:   1) DM: good control. Cont pioglit 15 qd. Hba1c today + lytes/cr. Feet exam normal today.  2) HTN: stable per home measurements. Cont coreg 6.25 bid and olmesart 20 qd.  3) HLD: statin intol.  Pt declines ANY further med tx for this problem. Not fasting today.  4)  Chronic bilat LL edema from chronic venous insuff: looking good on daily lasix 40mg . Lytes/cr today.  5) Chronic hyponatremia: suspect from chronic lasix use + increased free water intake. Taking 40mg  lasix qd now, dec free water to about 35 oz per day. Lytes/cr today.  6) L fibula fx: improving in cast, has ortho f/u arranged. requiring some prn tylenol only.  7) Health maintenance exam: Reviewed age and gender appropriate health maintenance issues (prudent diet, regular exercise, health risks of tobacco and excessive alcohol, use of seatbelts, fire alarms in home, use of sunscreen).  Also reviewed age and gender appropriate health screening as well as vaccine recommendations. Vaccines: Flu->given today.  Covid 19->UTD.  Tdap->rx printed for him to take to pharmacy.  Shingrix->decided to talk about this next time. Labs: CMET and A1c. Prostate ca screening: no further prostate ca screening indicated. Colon ca screening: Recall as of this year/2021->pt has decided not to get any further colonoscopies.  An After Visit Summary was printed and given to the patient.  FOLLOW UP:  Return in about 6 months (around 03/17/2021) for routine chronic illness f/u.  Signed:  Crissie Sickles, MD           09/16/2020

## 2020-10-07 DIAGNOSIS — S8265XD Nondisplaced fracture of lateral malleolus of left fibula, subsequent encounter for closed fracture with routine healing: Secondary | ICD-10-CM | POA: Diagnosis not present

## 2020-10-19 ENCOUNTER — Other Ambulatory Visit: Payer: Self-pay | Admitting: Family Medicine

## 2020-11-04 ENCOUNTER — Other Ambulatory Visit: Payer: Self-pay | Admitting: Family Medicine

## 2020-11-04 DIAGNOSIS — S8265XD Nondisplaced fracture of lateral malleolus of left fibula, subsequent encounter for closed fracture with routine healing: Secondary | ICD-10-CM | POA: Diagnosis not present

## 2020-11-15 NOTE — Progress Notes (Signed)
Subjective:   Antonio Serrano is a 75 y.o. male who presents for Medicare Annual/Subsequent preventive examination.  I connected with Cornelius today by telephone and verified that I am speaking with the correct person using two identifiers. Location patient: home Location provider: work Persons participating in the virtual visit: patient, Marine scientist.    I discussed the limitations, risks, security and privacy concerns of performing an evaluation and management service by telephone and the availability of in person appointments. I also discussed with the patient that there may be a patient responsible charge related to this service. The patient expressed understanding and verbally consented to this telephonic visit.    Interactive audio and video telecommunications were attempted between this provider and patient, however failed, due to patient having technical difficulties OR patient did not have access to video capability.  We continued and completed visit with audio only.  Some vital signs may be absent or patient reported.   Time Spent with patient on telephone encounter: 20 minutes   Review of Systems     Cardiac Risk Factors include: advanced age (>91men, >70 women);dyslipidemia;diabetes mellitus;hypertension     Objective:    Today's Vitals   11/16/20 1457  Weight: 160 lb (72.6 kg)  Height: 5\' 5"  (1.651 m)   Body mass index is 26.63 kg/m.  Advanced Directives 11/16/2020 09/04/2020 05/15/2019 01/10/2018 09/19/2016 01/20/2015  Does Patient Have a Medical Advance Directive? Yes Yes Yes Yes Yes Yes  Type of Paramedic of [place];Living will - [place];Living will Living will;Healthcare Power of [place];Living will Living will  Does patient want to make changes to medical advance directive? - - - - No - Patient declined -  Copy of [place] in Chart? No - copy requested - No - copy requested No -  copy requested No - copy requested No - copy requested    Current Medications (verified) Outpatient Encounter Medications as of 11/16/2020  Medication Sig  . aspirin EC 81 MG tablet Take 81 mg by mouth daily.  . carvedilol (COREG) 6.25 MG tablet TAKE 1 TABLET BY MOUTH TWICE DAILY WITH A MEAL  . esomeprazole (NEXIUM) 20 MG capsule Take 20 mg by mouth daily at 12 noon.  . furosemide (LASIX) 40 MG tablet TAKE 1 TABLET BY MOUTH ONCE DAILY . APPOINTMENT REQUIRED FOR FUTURE REFILLS  . ipratropium (ATROVENT) 0.03 % nasal spray USE 2 SPRAY(S) IN EACH NOSTRIL EVERY 12 HOURS  . olmesartan (BENICAR) 20 MG tablet Take 1/2 (one-half) tablet by mouth once daily  . pioglitazone (ACTOS) 15 MG tablet TAKE 1/2 TO 1 (ONE-HALF TO ONE) TABLET BY MOUTH ONCE DAILY   No facility-administered encounter medications on file as of 11/16/2020.    Allergies (verified) Bactrim [sulfamethoxazole-trimethoprim]   History: Past Medical History:  Diagnosis Date  . Atherosclerosis of coronary artery    On chest CT->calcif in LAD and RCA  . Chronic prostatitis    Very mild elevation of PSA (>4), referred to [facility] where PSA repeat was 1.0 on 06/16/13.  Annual PSA repeat is all that is needed now per urologist.  Most recent 07/2015 was 0.45.  Marland Kitchen Chronic renal insufficiency, stage III (moderate) (HCC)    Baseline GFR as of 2019= 50s.    . Diabetes mellitus with complication ([place]) Dx'd fall 2011  . HTN (hypertension)    Renal/aortic doppler u/s normal 06/2010  . Hypercalcemia   . Hyperlipidemia 2016   Statin intolerant--myalgias.  Pt refuses  any further trial of statin as of 2018.  . Nonspecific abnormal electrocardiogram (ECG) (EKG) Fall 2011   TWI in inferior leads: myocardial perfusion scan neg and echo normal 07/2010  . Tobacco dependence   . UTI (lower urinary tract infection)    Feb 2012 (klebsiella--dx at [facility]); 03/2013 e coli.    Past Surgical History:  Procedure Laterality Date  . CARDIOVASCULAR STRESS TEST   07/2010   Myocardial perfusion scan neg/low risk.  Marland Kitchen CATARACT EXTRACTION  2007, 2008   Bilateral ([facility] on [address].  . COLONOSCOPY  12/20/04;12/2014   2016 no polyps: recall 5 yrs due to [place] of colon ca  . ESOPHAGOGASTRODUODENOSCOPY  11/2004   esoph stricture/dilation  . TRANSTHORACIC ECHOCARDIOGRAM  07/2010   EF =>55%; LA mildly dilated; trace MR/TR;    Family History  Problem Relation Age of Onset  . Dementia Mother   . Pneumonia Father        died in 37's of pneumonia, was otherwise healthy  . Cancer Sister        colon cancer.  Died age 4  . Diabetes Paternal Aunt   . Diabetes Sister   . Colon cancer Neg Hx    Social History   Socioeconomic History  . Marital status: Married    Spouse name: Not on file  . Number of children: Not on file  . Years of education: Not on file  . Highest education level: Not on file  Occupational History  . Not on file  Tobacco Use  . Smoking status: Current Every Day Smoker    Packs/day: 0.25    Years: 50.00    Pack years: 12.50    Types: Cigarettes  . Smokeless tobacco: Never Used  Vaping Use  . Vaping Use: Never used  Substance and Sexual Activity  . Alcohol use: Not Currently    Alcohol/week: 0.0 standard drinks    Comment: rare alcohol  . Drug use: No  . Sexual activity: Not on file  Other Topics Concern  . Not on file  Social History Narrative   Married, 4 grown children, 6 grandchildren.   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   Lives in [place].   25 pack-yr tobacco hx--current as of 05/2014.   Drinks about a six pack per week.  Enjoys golf--six handicap at one point.  No formal exercise.   Walks a lot for his job.         Social Determinants of Health   Financial Resource Strain: Low Risk   . Difficulty of Paying Living Expenses: Not hard at all  Food Insecurity: No Food Insecurity  . Worried About Charity fundraiser in the Last  Year: Never true  . Ran Out of Food in the Last Year: Never true  Transportation Needs: No Transportation Needs  . Lack of Transportation (Medical): No  . Lack of Transportation (Non-Medical): No  Physical Activity: Insufficiently Active  . Days of Exercise per Week: 7 days  . Minutes of Exercise per Session: 10 min  Stress: No Stress Concern Present  . Feeling of Stress : Only a little  Social Connections: Moderately Integrated  . Frequency of Communication with Friends and Family: More than three times a week  . Frequency of Social Gatherings with Friends and Family: More than three times a week  . Attends Religious Services: 1 to 4 times per year  . Active Member of Clubs  or Organizations: No  . Attends Archivist Meetings: Never  . Marital Status: Married    Tobacco Counseling Ready to quit: Not Answered Counseling given: Not Answered   Clinical Intake:  Pre-visit preparation completed: No  Pain : No/denies pain     Nutritional Status: BMI 25 -29 Overweight Nutritional Risks: None Diabetes: Yes CBG done?: No Did pt. bring in CBG monitor from home?: No (phone visit)  How often do you need to have someone help you when you read instructions, pamphlets, or other written materials from your doctor or pharmacy?: 1 - Never  Diabetes:  Is the patient diabetic?  Yes  If diabetic, was a CBG obtained today?  No  Did the patient bring in their glucometer from home?  No phone visit How often do you monitor your CBG's? 3x per week.   Financial Strains and Diabetes Management:  Are you having any financial strains with the device, your supplies or your medication? No .  Does the patient want to be seen by Chronic Care Management for management of their diabetes?  No  Would the patient like to be referred to a Nutritionist or for Diabetic Management?  No   Diabetic Exams:  Diabetic Eye Exam: . Overdue for diabetic eye exam. Pt has been advised about the  importance in completing this exam. Patient advised to make an appt.  Diabetic Foot Exam: Completed 09/16/2020.    Interpreter Needed?: No  Information entered by :: Caroleen Hamman LPN   Activities of Daily Living In your present state of health, do you have any difficulty performing the following activities: 11/16/2020 09/16/2020  Hearing? N N  Vision? N N  Difficulty concentrating or making decisions? N N  Walking or climbing stairs? Y N  Dressing or bathing? N N  Doing errands, shopping? N N  Preparing Food and eating ? N -  Using the Toilet? N -  In the past six months, have you accidently leaked urine? N -  Do you have problems with loss of bowel control? N -  Managing your Medications? N -  Managing your Finances? N -  Housekeeping or managing your Housekeeping? N -  Some recent data might be hidden    Patient Care Team: Tammi Sou, MD as PCP - General (Family Medicine) Chuck Hint (Dentistry) Macarthur Critchley, [place] as Consulting Physician (Optometry) Corliss Parish, MD as Consulting Physician (Nephrology) [facility] [place], Dani Gobble, MD as Consulting Physician (Cardiology)  Indicate any recent Medical Services you may have received from other than [facility] providers in the past year (date may be approximate).     Assessment:   This is a routine wellness examination for [place].  Hearing/Vision screen  Hearing Screening   125Hz  250Hz  500Hz  1000Hz  2000Hz  3000Hz  4000Hz  6000Hz  8000Hz   Right ear:           Left ear:           Comments: No issues  Vision Screening Comments: Reading glasses Last eye exam-2020-Dr. Gilford Rile  Dietary issues and exercise activities discussed: Current Exercise Habits: Home exercise routine, Type of exercise: stretching, Time (Minutes): 15, Frequency (Times/Week): 7, Weekly Exercise (Minutes/Week): 105, Intensity: Mild, Exercise limited by: None identified  Goals    . Patient Stated     Maintain current health by staying  active. Increase water intake      Depression Screen PHQ 2/9 Scores 11/16/2020 09/16/2020 05/15/2019 10/17/2018 01/10/2018 12/19/2017 09/19/2016  PHQ - 2 Score 0 0 0 0 0 0  0  PHQ- 9 Score - - - - - 0 -    Fall Risk Fall Risk  11/16/2020 09/16/2020 05/15/2019 10/17/2018 01/10/2018  Falls in the past year? 1 1 0 1 No  Number falls in past yr: 0 0 0 0 -  Injury with Fall? 1 1 0 0 -  Risk for fall due to : History of fall(s) - - - -  Follow up Falls prevention discussed Falls evaluation completed Falls prevention discussed - -    FALL RISK PREVENTION PERTAINING TO THE HOME:  Any stairs in or around the home? Yes  If so, are there any without handrails? No  Home free of loose throw rugs in walkways, pet beds, electrical cords, etc? Yes  Adequate lighting in your home to reduce risk of falls? Yes   ASSISTIVE DEVICES UTILIZED TO PREVENT FALLS:  Life alert? No  Use of a cane, walker or w/c? Yes  Grab bars in the bathroom? Yes  Shower chair or bench in shower? No  Elevated toilet seat or a handicapped toilet? No   TIMED UP AND GO:  Was the test performed? No . Phone visit   Cognitive Function:Normal cognitive status assessed by this Nurse Health Advisor. No abnormalities found.   MMSE - Mini Mental State Exam 01/10/2018  Orientation to time 5  Orientation to Place 5  Registration 3  Attention/ Calculation 5  Recall 3  Language- name 2 objects 2  Language- repeat 1  Language- follow 3 step command 3  Language- read & follow direction 1  Write a sentence 1  Copy design 1  Total score 30        Immunizations Immunization History  Administered Date(s) Administered  . Fluad Quad(high Dose 65+) 06/18/2019, 09/16/2020  . Influenza, High Dose Seasonal PF 06/21/2017, 06/20/2018  . Influenza,inj,Quad PF,6+ Mos 05/31/2014, 06/27/2015  . Influenza-Unspecified 09/04/2016  . PFIZER(Purple Top)SARS-COV-2 Vaccination 11/09/2019, 12/03/2019, 08/04/2020  . Pneumococcal Conjugate-13  05/18/2014  . Pneumococcal Polysaccharide-23 07/29/2015    TDAP status: Due, Education has been provided regarding the importance of this vaccine. Advised may receive this vaccine at local pharmacy or Health Dept. Aware to provide a copy of the vaccination record if obtained from local pharmacy or Health Dept. Verbalized acceptance and understanding.  Flu Vaccine status: Up to date  Pneumococcal vaccine status: Up to date  Covid-19 vaccine status: Completed vaccines  Qualifies for Shingles Vaccine? Yes   Zostavax completed No   Shingrix Completed?: No.    Education has been provided regarding the importance of this vaccine. Patient has been advised to call insurance company to determine out of pocket expense if they have not yet received this vaccine. Advised may also receive vaccine at local pharmacy or Health Dept. Verbalized acceptance and understanding.  Screening Tests Health Maintenance  Topic Date Due  . OPHTHALMOLOGY EXAM  08/16/2018  . TETANUS/TDAP  10/04/2019  . COLONOSCOPY (Pts 45-32yrs Insurance coverage will need to be confirmed)  01/20/2020  . COVID-19 Vaccine (4 - Booster for Pfizer series) 02/01/2021  . HEMOGLOBIN A1C  03/17/2021  . FOOT EXAM  09/16/2021  . INFLUENZA VACCINE  Completed  . Hepatitis C Screening  Completed  . PNA vac Low Risk Adult  Completed    Health Maintenance  Health Maintenance Due  Topic Date Due  . OPHTHALMOLOGY EXAM  08/16/2018  . TETANUS/TDAP  10/04/2019  . COLONOSCOPY (Pts 45-75yrs Insurance coverage will need to be confirmed)  01/20/2020    Colorectal cancer screening:Due-patient declined  Lung Cancer Screening: (Low Dose CT Chest recommended if Age 76-80 years, 30 pack-year currently smoking OR have quit w/in 15years.) does not qualify.     Additional Screening:  Hepatitis C Screening:  Completed 03/22/2017  Vision Screening: Recommended annual ophthalmology exams for early detection of glaucoma and other disorders of the  eye. Is the patient up to date with their annual eye exam?  No  Who is the provider or what is the name of the office in which the patient attends annual eye exams? Dr. Gilford Rile   Dental Screening: Recommended annual dental exams for proper oral hygiene  Community Resource Referral / Chronic Care Management: CRR required this visit?  No   CCM required this visit?  No      Plan:     I have personally reviewed and noted the following in the patient's chart:   . Medical and social history . Use of alcohol, tobacco or illicit drugs  . Current medications and supplements . Functional ability and status . Nutritional status . Physical activity . Advanced directives . List of other physicians . Hospitalizations, surgeries, and ER visits in previous 12 months . Vitals . Screenings to include cognitive, depression, and falls . Referrals and appointments  In addition, I have reviewed and discussed with patient certain preventive protocols, quality metrics, and best practice recommendations. A written personalized care plan for preventive services as well as general preventive health recommendations were provided to patient.   Due to this being a telephonic visit, the after visit summary with patients personalized plan was offered to patient via mail or my-chart. Patient declined at this time.   Marta Antu, LPN   [id number]  Nurse Health Advisor  Nurse Notes: None

## 2020-11-16 ENCOUNTER — Ambulatory Visit (INDEPENDENT_AMBULATORY_CARE_PROVIDER_SITE_OTHER): Payer: Medicare Other

## 2020-11-16 VITALS — Ht 65.0 in | Wt 160.0 lb

## 2020-11-16 DIAGNOSIS — Z0000 Encounter for general adult medical examination without abnormal findings: Secondary | ICD-10-CM

## 2020-11-16 NOTE — Patient Instructions (Signed)
Antonio Serrano , Thank you for taking time to complete your Medicare Wellness Visit. I appreciate your ongoing commitment to your health goals. Please review the following plan we discussed and let me know if I can assist you in the future.   Screening recommendations/referrals: Colonoscopy: Due-Declined Recommended yearly ophthalmology/optometry visit for glaucoma screening and checkup Recommended yearly dental visit for hygiene and checkup  Vaccinations: Influenza vaccine: Up to date Pneumococcal vaccine: Completed vaccines Tdap vaccine: Discuss with pharmacy Shingles vaccine: Declined Covid-19: Completed vaccines  Advanced directives: Please bring a copy for your chart  Conditions/risks identified: See problem list  Next appointment: Follow up in one year for your annual wellness visit. 11/22/2021 @ 3:00  Preventive Care 65 Years and Older, Male Preventive care refers to lifestyle choices and visits with your health care provider that can promote health and wellness. What does preventive care include?  A yearly physical exam. This is also called an annual well check.  Dental exams once or twice a year.  Routine eye exams. Ask your health care provider how often you should have your eyes checked.  Personal lifestyle choices, including:  Daily care of your teeth and gums.  Regular physical activity.  Eating a healthy diet.  Avoiding tobacco and drug use.  Limiting alcohol use.  Practicing safe sex.  Taking low doses of aspirin every day.  Taking vitamin and mineral supplements as recommended by your health care provider. What happens during an annual well check? The services and screenings done by your health care provider during your annual well check will depend on your age, overall health, lifestyle risk factors, and family history of disease. Counseling  Your health care provider may ask you questions about your:  Alcohol use.  Tobacco use.  Drug  use.  Emotional well-being.  Home and relationship well-being.  Sexual activity.  Eating habits.  History of falls.  Memory and ability to understand (cognition).  Work and work Statistician. Screening  You may have the following tests or measurements:  Height, weight, and BMI.  Blood pressure.  Lipid and cholesterol levels. These may be checked every 5 years, or more frequently if you are over 69 years old.  Skin check.  Lung cancer screening. You may have this screening every year starting at age 79 if you have a 30-pack-year history of smoking and currently smoke or have quit within the past 15 years.  Fecal occult blood test (FOBT) of the stool. You may have this test every year starting at age 58.  Flexible sigmoidoscopy or colonoscopy. You may have a sigmoidoscopy every 5 years or a colonoscopy every 10 years starting at age 62.  Prostate cancer screening. Recommendations will vary depending on your family history and other risks.  Hepatitis C blood test.  Hepatitis B blood test.  Sexually transmitted disease (STD) testing.  Diabetes screening. This is done by checking your blood sugar (glucose) after you have not eaten for a while (fasting). You may have this done every 1-3 years.  Abdominal aortic aneurysm (AAA) screening. You may need this if you are a current or former smoker.  Osteoporosis. You may be screened starting at age 62 if you are at high risk. Talk with your health care provider about your test results, treatment options, and if necessary, the need for more tests. Vaccines  Your health care provider may recommend certain vaccines, such as:  Influenza vaccine. This is recommended every year.  Tetanus, diphtheria, and acellular pertussis (Tdap, Td) vaccine. You may  need a Td booster every 10 years.  Zoster vaccine. You may need this after age 23.  Pneumococcal 13-valent conjugate (PCV13) vaccine. One dose is recommended after age  10.  Pneumococcal polysaccharide (PPSV23) vaccine. One dose is recommended after age 44. Talk to your health care provider about which screenings and vaccines you need and how often you need them. This information is not intended to replace advice given to you by your health care provider. Make sure you discuss any questions you have with your health care provider. Document Released: 10/14/2015 Document Revised: 06/06/2016 Document Reviewed: 07/19/2015 Elsevier Interactive Patient Education  2017 [place] Prevention in the Home Falls can cause injuries. They can happen to people of all ages. There are many things you can do to make your home safe and to help prevent falls. What can I do on the outside of my home?  Regularly fix the edges of walkways and driveways and fix any cracks.  Remove anything that might make you trip as you walk through a door, such as a raised step or threshold.  Trim any bushes or trees on the path to your home.  Use bright outdoor lighting.  Clear any walking paths of anything that might make someone trip, such as rocks or tools.  Regularly check to see if handrails are loose or broken. Make sure that both sides of any steps have handrails.  Any raised decks and porches should have guardrails on the edges.  Have any leaves, snow, or ice cleared regularly.  Use sand or salt on walking paths during winter.  Clean up any spills in your garage right away. This includes oil or grease spills. What can I do in the bathroom?  Use night lights.  Install grab bars by the toilet and in the tub and shower. Do not use towel bars as grab bars.  Use non-skid mats or decals in the tub or shower.  If you need to sit down in the shower, use a plastic, non-slip stool.  Keep the floor dry. Clean up any water that spills on the floor as soon as it happens.  Remove soap buildup in the tub or shower regularly.  Attach bath mats securely with double-sided  non-slip rug tape.  Do not have throw rugs and other things on the floor that can make you trip. What can I do in the bedroom?  Use night lights.  Make sure that you have a light by your bed that is easy to reach.  Do not use any sheets or blankets that are too big for your bed. They should not hang down onto the floor.  Have a firm chair that has side arms. You can use this for support while you get dressed.  Do not have throw rugs and other things on the floor that can make you trip. What can I do in the kitchen?  Clean up any spills right away.  Avoid walking on wet floors.  Keep items that you use a lot in easy-to-reach places.  If you need to reach something above you, use a strong step stool that has a grab bar.  Keep electrical cords out of the way.  Do not use floor polish or wax that makes floors slippery. If you must use wax, use non-skid floor wax.  Do not have throw rugs and other things on the floor that can make you trip. What can I do with my stairs?  Do not leave any items on  the stairs.  Make sure that there are handrails on both sides of the stairs and use them. Fix handrails that are broken or loose. Make sure that handrails are as long as the stairways.  Check any carpeting to make sure that it is firmly attached to the stairs. Fix any carpet that is loose or worn.  Avoid having throw rugs at the top or bottom of the stairs. If you do have throw rugs, attach them to the floor with carpet tape.  Make sure that you have a light switch at the top of the stairs and the bottom of the stairs. If you do not have them, ask someone to add them for you. What else can I do to help prevent falls?  Wear shoes that:  Do not have high heels.  Have rubber bottoms.  Are comfortable and fit you well.  Are closed at the toe. Do not wear sandals.  If you use a stepladder:  Make sure that it is fully opened. Do not climb a closed stepladder.  Make sure that both  sides of the stepladder are locked into place.  Ask someone to hold it for you, if possible.  Clearly mark and make sure that you can see:  Any grab bars or handrails.  First and last steps.  Where the edge of each step is.  Use tools that help you move around (mobility aids) if they are needed. These include:  Canes.  Walkers.  Scooters.  Crutches.  Turn on the lights when you go into a dark area. Replace any light bulbs as soon as they burn out.  Set up your furniture so you have a clear path. Avoid moving your furniture around.  If any of your floors are uneven, fix them.  If there are any pets around you, be aware of where they are.  Review your medicines with your doctor. Some medicines can make you feel dizzy. This can increase your chance of falling. Ask your doctor what other things that you can do to help prevent falls. This information is not intended to replace advice given to you by your health care provider. Make sure you discuss any questions you have with your health care provider. Document Released: 07/14/2009 Document Revised: 02/23/2016 Document Reviewed: 10/22/2014 Elsevier Interactive Patient Education  2017 [employer].

## 2020-11-24 ENCOUNTER — Other Ambulatory Visit: Payer: Self-pay | Admitting: Family Medicine

## 2020-11-30 ENCOUNTER — Other Ambulatory Visit: Payer: Self-pay | Admitting: Family Medicine

## 2020-12-02 DIAGNOSIS — S8265XD Nondisplaced fracture of lateral malleolus of left fibula, subsequent encounter for closed fracture with routine healing: Secondary | ICD-10-CM | POA: Diagnosis not present

## 2021-01-19 ENCOUNTER — Other Ambulatory Visit: Payer: Self-pay | Admitting: Family Medicine

## 2021-01-20 ENCOUNTER — Other Ambulatory Visit: Payer: Self-pay | Admitting: Family Medicine

## 2021-02-03 ENCOUNTER — Telehealth: Payer: Self-pay | Admitting: Family Medicine

## 2021-02-03 NOTE — Telephone Encounter (Signed)
Spoke with pt regarding rx request and he stated he has been taking allergy medication but it is not helping. Offered to schedule appt with another provider for re-evaluation since last seen for this issue 07/27/19. He declined due to transportation issues. I offered an urgent care option for tomorrow for in person evaluation. Pt agreed

## 2021-02-03 NOTE — Telephone Encounter (Signed)
Patient wants to know if he can have a Z-pak sent in. He states that he's experiencing the same symptoms that he had last year when the doctor sent him one in. He states that he does not want to come in if he doesn't have to. He has a runny nose and cough and doesn't want it to get into his chest. Please call him back on [phone] and advise.

## 2021-02-04 ENCOUNTER — Other Ambulatory Visit: Payer: Self-pay

## 2021-02-04 ENCOUNTER — Ambulatory Visit (HOSPITAL_COMMUNITY)
Admission: EM | Admit: 2021-02-04 | Discharge: 2021-02-04 | Disposition: A | Discharge status: Home / Self Care | Payer: Medicare Other | Attending: Emergency Medicine | Admitting: Emergency Medicine

## 2021-02-04 DIAGNOSIS — I10 Essential (primary) hypertension: Secondary | ICD-10-CM | POA: Diagnosis not present

## 2021-02-04 DIAGNOSIS — J0100 Acute maxillary sinusitis, unspecified: Secondary | ICD-10-CM

## 2021-02-04 DIAGNOSIS — Z20822 Contact with and (suspected) exposure to covid-19: Secondary | ICD-10-CM | POA: Insufficient documentation

## 2021-02-04 LAB — SARS CORONAVIRUS 2 (TAT 6-24 HRS): SARS Coronavirus 2: NEGATIVE

## 2021-02-04 MED ORDER — AZITHROMYCIN 250 MG PO TABS: 250.0000 mg | ORAL_TABLET | Freq: Every day | ORAL | 0 refills | Status: DC

## 2021-02-04 NOTE — ED Provider Notes (Signed)
[place]    CSN: [id number] Arrival date & time: 02/04/21  1134      History   Chief Complaint Chief Complaint  Patient presents with  . Cough    HPI Chayne Baumgart is a 75 y.o. male.   Patient presents with sinus congestion, postnasal drip, runny nose, productive cough x1 week.  He denies fever, chills, shortness of breath, or other symptoms.  OTC treatment attempted at home.  His medical history includes hypertension, chronic renal insufficiency, diabetes, chronic prostatitis, tobacco dependence.  The history is provided by the patient and medical records.    Past Medical History:  Diagnosis Date  . Atherosclerosis of coronary artery    On chest CT->calcif in LAD and RCA  . Chronic prostatitis    Very mild elevation of PSA (>4), referred to [facility] where PSA repeat was 1.0 on 06/16/13.  Annual PSA repeat is all that is needed now per urologist.  Most recent 07/2015 was 0.45.  Marland Kitchen Chronic renal insufficiency, stage III (moderate) (HCC)    Baseline GFR as of 2019= 50s.    . Diabetes mellitus with complication ([place]) Dx'd fall 2011  . HTN (hypertension)    Renal/aortic doppler u/s normal 06/2010  . Hypercalcemia   . Hyperlipidemia 2016   Statin intolerant--myalgias.  Pt refuses any further trial of statin as of 2018.  . Nonspecific abnormal electrocardiogram (ECG) (EKG) Fall 2011   TWI in inferior leads: myocardial perfusion scan neg and echo normal 07/2010  . Tobacco dependence   . UTI (lower urinary tract infection)    Feb 2012 (klebsiella--dx at [facility]); 03/2013 e coli.     Patient Active Problem List   Diagnosis Date Noted  . Diabetes mellitus with complication ([place]) 93/81/8299  . Hyperlipidemia 07/29/2015  . Conjunctivitis 06/27/2015  . Healthcare maintenance 06/27/2015  . Type II diabetes mellitus with renal manifestations ([place]) 07/16/2014  . Constipation, chronic 07/16/2014  . Preventative health care 05/18/2014  . Testicular pain, right 06/25/2013  .  Erectile dysfunction 05/31/2013  . Chronic renal insufficiency, stage III (moderate) (HCC)   . HTN (hypertension), benign 01/30/2012  . Tobacco dependence 01/30/2012    Past Surgical History:  Procedure Laterality Date  . CARDIOVASCULAR STRESS TEST  07/2010   Myocardial perfusion scan neg/low risk.  Marland Kitchen CATARACT EXTRACTION  2007, 2008   Bilateral ([facility] on [address].  . COLONOSCOPY  12/20/04;12/2014   2016 no polyps: recall 5 yrs due to [place] of colon ca  . ESOPHAGOGASTRODUODENOSCOPY  11/2004   esoph stricture/dilation  . TRANSTHORACIC ECHOCARDIOGRAM  07/2010   EF =>55%; LA mildly dilated; trace MR/TR;        Home Medications    Prior to Admission medications   Medication Sig Start Date End Date Taking? Authorizing Provider  azithromycin (ZITHROMAX) 250 MG tablet Take 1 tablet (250 mg total) by mouth daily. Take first 2 tablets together, then 1 every day until finished. 02/04/21  Yes Sharion Balloon, NP  aspirin EC 81 MG tablet Take 81 mg by mouth daily.    [provider]  carvedilol (COREG) 6.25 MG tablet TAKE 1 TABLET BY MOUTH TWICE DAILY WITH A MEAL 11/30/20   [name], Adrian Blackwater, MD  esomeprazole (NEXIUM) 20 MG capsule Take 20 mg by mouth daily at 12 noon.    [provider]  furosemide (LASIX) 40 MG tablet TAKE 1 TABLET BY MOUTH ONCE DAILY . APPOINTMENT REQUIRED FOR FUTURE REFILLS 10/20/20   [name], Adrian Blackwater, MD  ipratropium (  ATROVENT) 0.03 % nasal spray USE 2 SPRAY(S) IN EACH NOSTRIL EVERY 12 HOURS 01/24/21   [name], Adrian Blackwater, MD  olmesartan (BENICAR) 20 MG tablet Take 1/2 (one-half) tablet by mouth once daily 11/24/20   [name], Adrian Blackwater, MD  pioglitazone (ACTOS) 15 MG tablet TAKE 1/2 TO 1 (ONE-HALF TO ONE) TABLET BY MOUTH ONCE DAILY 08/24/20   [name], Adrian Blackwater, MD    Family History Family History  Problem Relation Age of Onset  . Dementia Mother   . Pneumonia Father        died in 29's of pneumonia, was otherwise healthy  . Cancer Sister         colon cancer.  Died age 102  . Diabetes Paternal Aunt   . Diabetes Sister   . Colon cancer Neg Hx     Social History Social History   Tobacco Use  . Smoking status: Current Every Day Smoker    Packs/day: 0.25    Years: 50.00    Pack years: 12.50    Types: Cigarettes  . Smokeless tobacco: Never Used  Vaping Use  . Vaping Use: Never used  Substance Use Topics  . Alcohol use: Not Currently    Alcohol/week: 0.0 standard drinks    Comment: rare alcohol  . Drug use: No     Allergies   Bactrim [sulfamethoxazole-trimethoprim]   Review of Systems Review of Systems  Constitutional: Negative for chills and fever.  HENT: Positive for congestion, postnasal drip, rhinorrhea and sinus pressure. Negative for ear pain and sore throat.   Respiratory: Positive for cough. Negative for shortness of breath.   Cardiovascular: Negative for chest pain and palpitations.  Gastrointestinal: Negative for abdominal pain, diarrhea and vomiting.  Skin: Negative for color change and rash.  All other systems reviewed and are negative.    Physical Exam Triage Vital Signs ED Triage Vitals  Enc Vitals Group     BP      Pulse      Resp      Temp      Temp src      SpO2      Weight      Height      Head Circumference      Peak Flow      Pain Score      Pain Loc      Pain Edu?      Excl. in [place]?    No data found.  Updated Vital Signs BP (!) 165/67 (BP Location: Right Arm)   Pulse (!) 54   Temp 97.6 F (36.4 C) (Oral)   Resp 18   SpO2 95%   Visual Acuity Right Eye Distance:   Left Eye Distance:   Bilateral Distance:    Right Eye Near:   Left Eye Near:    Bilateral Near:     Physical Exam Vitals and nursing note reviewed.  Constitutional:      General: He is not in acute distress.    Appearance: He is well-developed.  HENT:     Head: Normocephalic and atraumatic.     Right Ear: Tympanic membrane normal.     Left Ear: Tympanic membrane normal.     Nose: Congestion  present.     Mouth/Throat:     Mouth: Mucous membranes are moist.     Pharynx: Oropharynx is clear.  Eyes:     Conjunctiva/sclera: Conjunctivae normal.  Cardiovascular:     Rate and Rhythm: Normal rate and regular rhythm.  Heart sounds: Normal heart sounds.  Pulmonary:     Effort: Pulmonary effort is normal. No respiratory distress.     Breath sounds: Normal breath sounds.  Abdominal:     Palpations: Abdomen is soft.     Tenderness: There is no abdominal tenderness.  Musculoskeletal:     Cervical back: Neck supple.  Skin:    General: Skin is warm and dry.  Neurological:     Mental Status: He is alert.  Psychiatric:        Mood and Affect: Mood normal.        Behavior: Behavior normal.      UC Treatments / Results  Labs (all labs ordered are listed, but only abnormal results are displayed) Labs Reviewed  SARS CORONAVIRUS 2 (TAT 6-24 HRS)    EKG   Radiology No results found.  Procedures Procedures (including critical care time)  Medications Ordered in UC Medications - No data to display  Initial Impression / Assessment and Plan / UC Course  I have reviewed the triage vital signs and the nursing notes.  Pertinent labs & imaging results that were available during my care of the patient were reviewed by me and considered in my medical decision making (see chart for details).   Acute maxillary sinusitis.  Elevated blood pressure reading with known hypertension.  PCR COVID pending.  Patient instructed to self quarantine until the test result is back.  Treating with Zithromax.  Instructed patient to follow-up with his PCP if his symptoms are not improving.  Also discussed that his blood pressure is elevated today and needs to be rechecked by his PCP in 2 to 4 weeks.  He agrees to plan of care.   Final Clinical Impressions(s) / UC Diagnoses   Final diagnoses:  Acute non-recurrent maxillary sinusitis  Elevated blood pressure reading in office with diagnosis of  hypertension     Discharge Instructions     Take the Zithromax as directed.  Follow up with your primary care provider if your symptoms are not improving.    Your blood pressure is elevated today at 165/67.  Please have this rechecked by your primary care provider in 2-4 weeks.         ED Prescriptions    Medication Sig Dispense Auth. Provider   azithromycin (ZITHROMAX) 250 MG tablet Take 1 tablet (250 mg total) by mouth daily. Take first 2 tablets together, then 1 every day until finished. 6 tablet Sharion Balloon, NP     PDMP not reviewed this encounter.   Sharion Balloon, NP 02/04/21 1322

## 2021-02-04 NOTE — ED Triage Notes (Signed)
PT reports productive cough for 1 week.

## 2021-02-04 NOTE — Discharge Instructions (Addendum)
Take the Zithromax as directed.  Follow up with your primary care provider if your symptoms are not improving.    Your blood pressure is elevated today at 165/67.  Please have this rechecked by your primary care provider in 2-4 weeks.

## 2021-02-23 ENCOUNTER — Other Ambulatory Visit: Payer: Self-pay | Admitting: Family Medicine

## 2021-03-06 ENCOUNTER — Telehealth: Payer: Self-pay | Admitting: Family Medicine

## 2021-03-06 DIAGNOSIS — E118 Type 2 diabetes mellitus with unspecified complications: Secondary | ICD-10-CM

## 2021-03-06 NOTE — Telephone Encounter (Signed)
Pt was last seen 09/16/20. Last referral was 2018 to Macarthur Critchley, OD   Please advise, thanks.

## 2021-03-06 NOTE — Telephone Encounter (Signed)
Patient states that he received a call regarding making an eye appointment. He said that he would like to go back to the same eye doctor that he was referred to the last time. Please give him a call back at [phone] if you have any questions.

## 2021-03-06 NOTE — Telephone Encounter (Signed)
Tried contacting pt regarding referral placed. Phone rang several times.

## 2021-03-06 NOTE — Telephone Encounter (Signed)
Ok, referral to Dr. Nicki Reaper ordered

## 2021-03-07 NOTE — Telephone Encounter (Signed)
Spoke with pt regarding referral placed, office number provided; [phone]

## 2021-03-16 ENCOUNTER — Encounter: Payer: Self-pay | Admitting: Pharmacist

## 2021-03-16 DIAGNOSIS — Z789 Other specified health status: Secondary | ICD-10-CM

## 2021-03-16 NOTE — Progress Notes (Signed)
[facility])                                            [place] Team                                        Statin Quality Measure Assessment    03/16/2021  Antonio Serrano 1945-11-21 [id number]   Per review of chart and payor information, patient has a diagnosis of diabetes but is not currently filling a statin prescription.  This places patient into the SUPD (Statin Use In Patients with Diabetes) measure for CMS.    Patient has documented allergy to statin but no corresponding CPT codes that would exclude patient from SUPD measure.   G72.0 was associated with a visit last year which removed the patient from the measure. Patient has an upcoming appointment on 03/17/21.  If deemed therapeutically appropriate, his provider could associate an approved statin exclusion code to the upcoming visit.  The 10-year ASCVD risk score [name], et al., 2013) is: 59.8%   Values used to calculate the score:     Age: 75 years     Sex: Male     Is Non-Hispanic African American: No     Diabetic: Yes     Tobacco smoker: Yes     Systolic Blood Pressure: 426 mmHg     Is BP treated: Yes     HDL Cholesterol: 52 mg/dL     Total Cholesterol: 161 mg/dL 09/15/2019     Component Value Date/Time   CHOL 161 09/15/2019 1138   CHOL CANCELED 02/16/2019 0953   TRIG 82.0 09/15/2019 1138   HDL 52.00 09/15/2019 1138   HDL CANCELED 02/16/2019 0953   CHOLHDL 3 09/15/2019 1138   VLDL 16.4 09/15/2019 1138   LDLCALC 93 09/15/2019 1138    Please consider ONE of the following recommendations:  Initiate high intensity statin Atorvastatin 40mg  once daily, #90, 3 refills   Rosuvastatin 20mg  once daily, #90, 3 refills    Initiate moderate intensity          statin with reduced frequency if prior          statin intolerance 1x weekly, #13, 3 refills   2x weekly, #26, 3 refills   3x weekly, #39, 3 refills    Code for past statin intolerance or   other exclusions (required annually)   Provider Requirements:  Associate code during an office visit or telehealth encounter  Drug Induced Myopathy G72.0   Myopathy, unspecified G72.9   Myositis, unspecified M60.9   Rhabdomyolysis S34.19   Alcoholic fatty liver Q22.2   Cirrhosis of liver K74.69   Prediabetes R73.03   PCOS E28.2   Toxic liver disease, unspecified K71.9   Adverse effect of antihyperlipidemic and antiarteriosclerotic drugs, initial encounter [place], PharmD, [place] Pharmacist [phone]

## 2021-03-17 ENCOUNTER — Ambulatory Visit (INDEPENDENT_AMBULATORY_CARE_PROVIDER_SITE_OTHER): Payer: Medicare Other | Admitting: Family Medicine

## 2021-03-17 ENCOUNTER — Encounter: Payer: Self-pay | Admitting: Family Medicine

## 2021-03-17 ENCOUNTER — Other Ambulatory Visit: Payer: Self-pay

## 2021-03-17 VITALS — BP 130/70 | HR 52 | Temp 97.7°F | Resp 16 | Ht 65.0 in | Wt 160.8 lb

## 2021-03-17 DIAGNOSIS — E118 Type 2 diabetes mellitus with unspecified complications: Secondary | ICD-10-CM | POA: Diagnosis not present

## 2021-03-17 DIAGNOSIS — E1122 Type 2 diabetes mellitus with diabetic chronic kidney disease: Secondary | ICD-10-CM

## 2021-03-17 DIAGNOSIS — N1831 Chronic kidney disease, stage 3a: Secondary | ICD-10-CM | POA: Diagnosis not present

## 2021-03-17 DIAGNOSIS — R2681 Unsteadiness on feet: Secondary | ICD-10-CM

## 2021-03-17 DIAGNOSIS — E7800 Pure hypercholesterolemia, unspecified: Secondary | ICD-10-CM

## 2021-03-17 DIAGNOSIS — N183 Chronic kidney disease, stage 3 unspecified: Secondary | ICD-10-CM

## 2021-03-17 DIAGNOSIS — R5381 Other malaise: Secondary | ICD-10-CM

## 2021-03-17 DIAGNOSIS — Z789 Other specified health status: Secondary | ICD-10-CM

## 2021-03-17 DIAGNOSIS — Z0000 Encounter for general adult medical examination without abnormal findings: Secondary | ICD-10-CM

## 2021-03-17 LAB — BASIC METABOLIC PANEL
BUN: 15 mg/dL (ref 6–23)
CO2: 29 mEq/L (ref 19–32)
Calcium: 9.3 mg/dL (ref 8.4–10.5)
Chloride: 97 mEq/L (ref 96–112)
Creatinine, Ser: 1.42 mg/dL (ref 0.40–1.50)
GFR: 49.05 mL/min — ABNORMAL LOW (ref >60.00)
Glucose, Bld: 102 mg/dL — ABNORMAL HIGH (ref 70–99)
Potassium: 4.1 mEq/L (ref 3.5–5.1)
Sodium: 133 mEq/L — ABNORMAL LOW (ref 135–145)

## 2021-03-17 LAB — MICROALBUMIN / CREATININE URINE RATIO
Creatinine,U: 171.4 mg/dL
Microalb Creat Ratio: 4.1 mg/g (ref 0.0–30.0)
Microalb, Ur: 7.1 mg/dL — ABNORMAL HIGH (ref 0.0–1.9)

## 2021-03-17 LAB — HEMOGLOBIN A1C: Hgb A1c MFr Bld: 6.8 % — ABNORMAL HIGH (ref 4.6–6.5)

## 2021-03-17 MED ORDER — TETANUS-DIPHTH-ACELL PERTUSSIS 5-2.5-18.5 LF-MCG/0.5 IM SUSP: 0.5000 mL | Freq: Once | INTRAMUSCULAR | 0 refills | Status: AC

## 2021-03-17 MED ORDER — FUROSEMIDE 40 MG PO TABS: ORAL_TABLET | ORAL | 3 refills | Status: DC

## 2021-03-17 NOTE — Progress Notes (Signed)
OFFICE VISIT  03/17/2021  CC:  Chief Complaint  Patient presents with   Follow-up    RCI, 6 mo. Pt is not fasting    HPI:    Patient is a 75 y.o. Caucasian male who presents for 6 mo f/u DM, HTN, HLD, LE edema. A/P as of last visit: "1) DM: good control. Cont pioglit 15 qd. Hba1c today + lytes/cr. Feet exam normal today.   2) HTN: stable per home measurements. Cont coreg 6.25 bid and olmesart 20 qd.   3) HLD: statin intol.  Pt declines ANY further med tx for this problem. Not fasting today.   4) Chronic bilat LL edema from chronic venous insuff: looking good on daily lasix 40mg . Lytes/cr today.   5) Chronic hyponatremia: suspect from chronic lasix use + increased free water intake. Taking 40mg  lasix qd now, dec free water to about 35 oz per day. Lytes/cr today.   6) L fibula fx: improving in cast, has ortho f/u arranged. requiring some prn tylenol only.   7) Health maintenance exam: Reviewed age and gender appropriate health maintenance issues (prudent diet, regular exercise, health risks of tobacco and excessive alcohol, use of seatbelts, fire alarms in home, use of sunscreen).  Also reviewed age and gender appropriate health screening as well as vaccine recommendations. Vaccines: Flu->given today.  Covid 19->UTD.  Tdap->rx printed for him to take to pharmacy.  Shingrix->decided to talk about this next time. Labs: CMET and A1c. Prostate ca screening: no further prostate ca screening indicated. Colon ca screening: Recall as of this year/2021->pt has decided not to get any further colonoscopies."  INTERIM HX: Doing fine. L fibula fx healing well but since his injury and subsequent 2+ mo of immobility he has felt unstead on his feet.  No dizziness or vertigo.  No pain in legs.  Still working on getting LL and ankle strength back to normal.  Uses walker much of the time now.  DM: fasting gluc 3 x/week, usually 90s to 115.    HTN: home bp's consistently 120s/70s.  HR  consistently 50s.  HLD: statin intol.  Pt declines ANY further med tx for this problem.  ROS as above, plus--> no fevers, no CP, no SOB, no wheezing, no cough, no dizziness, no HAs, no rashes, no melena/hematochezia.  No polyuria or polydipsia.  No myalgias or arthralgias.  No focal weakness, paresthesias, or tremors.  No acute vision or hearing abnormalities.  No dysuria or unusual/new urinary urgency or frequency.  No recent changes in lower legs. No n/v/d or abd pain.  No palpitations.    Past Medical History:  Diagnosis Date   Atherosclerosis of coronary artery    On chest CT->calcif in LAD and RCA   Chronic prostatitis    Very mild elevation of PSA (>4), referred to [facility] where PSA repeat was 1.0 on 06/16/13.  Annual PSA repeat is all that is needed now per urologist.  Most recent 07/2015 was 0.45.   Chronic renal insufficiency, stage III (moderate) (HCC)    Baseline GFR as of 2019= 50s.     Diabetes mellitus with complication ([place]) Dx'd fall 2011   HTN (hypertension)    Renal/aortic doppler u/s normal 06/2010   Hypercalcemia    Hyperlipidemia 2016   Statin intolerant--myalgias.  Pt refuses any further trial of statin as of 2018.   Nonspecific abnormal electrocardiogram (ECG) (EKG) Fall 2011   TWI in inferior leads: myocardial perfusion scan neg and echo normal 07/2010   Tobacco dependence  UTI (lower urinary tract infection)    Feb 2012 (klebsiella--dx at [facility]); 03/2013 e coli.     Past Surgical History:  Procedure Laterality Date   CARDIOVASCULAR STRESS TEST  07/2010   Myocardial perfusion scan neg/low risk.   CATARACT EXTRACTION  2007, 2008   Bilateral ([facility] on [address].   COLONOSCOPY  12/20/04;12/2014   2016 no polyps: recall 5 yrs due to [place] of colon ca   ESOPHAGOGASTRODUODENOSCOPY  11/2004   esoph stricture/dilation   TRANSTHORACIC ECHOCARDIOGRAM  07/2010   EF =>55%; LA mildly dilated; trace MR/TR;     Outpatient Medications Prior to Visit   Medication Sig Dispense Refill   aspirin EC 81 MG tablet Take 81 mg by mouth daily.     carvedilol (COREG) 6.25 MG tablet TAKE 1 TABLET BY MOUTH TWICE DAILY WITH A MEAL 180 tablet 1   esomeprazole (NEXIUM) 20 MG capsule Take 20 mg by mouth daily at 12 noon.     ipratropium (ATROVENT) 0.03 % nasal spray USE 2 SPRAY(S) IN EACH NOSTRIL EVERY 12 HOURS 30 mL 1   olmesartan (BENICAR) 20 MG tablet Take 1/2 (one-half) tablet by mouth once daily 45 tablet 1   pioglitazone (ACTOS) 15 MG tablet TAKE 1/2 TO 1 (ONE-HALF TO ONE) TABLET BY MOUTH ONCE DAILY 90 tablet 0   furosemide (LASIX) 40 MG tablet TAKE 1 TABLET BY MOUTH ONCE DAILY . APPOINTMENT REQUIRED FOR FUTURE REFILLS 90 tablet 1   azithromycin (ZITHROMAX) 250 MG tablet Take 1 tablet (250 mg total) by mouth daily. Take first 2 tablets together, then 1 every day until finished. (Patient not taking: Reported on 03/17/2021) 6 tablet 0   No facility-administered medications prior to visit.    Allergies  Allergen Reactions   Bactrim [Sulfamethoxazole-Trimethoprim] Nausea Only    ROS As per HPI  PE: Vitals with BMI 03/17/2021 02/04/2021 11/16/2020  Height 5\' 5"  - 5\' 5"   Weight 160 lbs 13 oz - 160 lbs  BMI 34.19 - 62.22  Systolic 979 892 -  Diastolic 70 67 -  Pulse 52 54 -    Gen: Alert, well appearing.  Patient is oriented to person, place, time, and situation. AFFECT: pleasant, lucid thought and speech. CV: RRR, no m/r/g.   LUNGS: CTA bilat, nonlabored resps, good aeration in all lung fields. EXT: no clubbing or cyanosis.  no edema.    LABS:  No results found for: TSH Lab Results  Component Value Date   WBC 4.9 09/15/2019   HGB 16.0 09/15/2019   HCT 48.0 09/15/2019   MCV 90.0 09/15/2019   PLT 284.0 09/15/2019   Lab Results  Component Value Date   CREATININE 1.26 09/16/2020   BUN 14 09/16/2020   NA 132 (L) 09/16/2020   K 3.7 09/16/2020   CL 97 09/16/2020   CO2 28 09/16/2020   Lab Results  Component Value Date   ALT 15  09/16/2020   AST 17 09/16/2020   ALKPHOS 92 09/16/2020   BILITOT 0.5 09/16/2020   Lab Results  Component Value Date   CHOL 161 09/15/2019   Lab Results  Component Value Date   HDL 52.00 09/15/2019   Lab Results  Component Value Date   LDLCALC 93 09/15/2019   Lab Results  Component Value Date   TRIG 82.0 09/15/2019   Lab Results  Component Value Date   CHOLHDL 3 09/15/2019   Lab Results  Component Value Date   PSA 0.85 12/19/2017   PSA 0.98 09/19/2016  PSA 0.45 07/29/2015   Lab Results  Component Value Date   HGBA1C 6.7 (H) 09/16/2020   IMPRESSION AND PLAN:  1) DM: good control. Cont pioglitazone. Hba1c and lytes/cr today and urine microalb/cr today.  2) HTN: well controlled. Cont coreg 6.25 bid and olmesartan 1/2 20mg  tab qd.  3) CRI III: he avoids NSAIDs. Hydrates fairly well. BMET today.  4) HLD: statin intol.  Pt declines ANY further med tx for this problem.  5) Deconditioning: secondary to his fibula fx 6 mo ago and the immobility that was required while this healed.  His gait is unsteady and he needs PT to help this but he declined this today, wants to just continue to work on things at home.  Continue rolling walker prn.  6) Preventative health care: Tdap rx sent to pharmacy again ([facility] [address]).  An After Visit Summary was printed and given to the patient.  FOLLOW UP: Return in about 6 months (around 09/16/2021) for annual CPE (fasting) + RCI.  Signed:  Crissie Sickles, MD           03/17/2021

## 2021-03-31 DIAGNOSIS — S72001A Fracture of unspecified part of neck of right femur, initial encounter for closed fracture: Secondary | ICD-10-CM

## 2021-03-31 HISTORY — DX: Fracture of unspecified part of neck of right femur, initial encounter for closed fracture: S72.001A

## 2021-04-22 ENCOUNTER — Other Ambulatory Visit: Payer: Self-pay

## 2021-04-22 ENCOUNTER — Emergency Department (HOSPITAL_COMMUNITY): Payer: Medicare Other

## 2021-04-22 ENCOUNTER — Encounter (HOSPITAL_COMMUNITY): Payer: Self-pay | Admitting: *Deleted

## 2021-04-22 ENCOUNTER — Inpatient Hospital Stay (HOSPITAL_COMMUNITY)
Admission: EM | Admit: 2021-04-22 | Discharge: 2021-04-25 | DRG: 522 | Disposition: A | Discharge status: Home Health | Payer: Medicare Other | Attending: Internal Medicine | Admitting: Internal Medicine

## 2021-04-22 DIAGNOSIS — N1831 Chronic kidney disease, stage 3a: Secondary | ICD-10-CM | POA: Diagnosis not present

## 2021-04-22 DIAGNOSIS — Z8781 Personal history of (healed) traumatic fracture: Secondary | ICD-10-CM

## 2021-04-22 DIAGNOSIS — Z96641 Presence of right artificial hip joint: Secondary | ICD-10-CM | POA: Diagnosis not present

## 2021-04-22 DIAGNOSIS — F17200 Nicotine dependence, unspecified, uncomplicated: Secondary | ICD-10-CM | POA: Diagnosis present

## 2021-04-22 DIAGNOSIS — Z833 Family history of diabetes mellitus: Secondary | ICD-10-CM

## 2021-04-22 DIAGNOSIS — M47812 Spondylosis without myelopathy or radiculopathy, cervical region: Secondary | ICD-10-CM | POA: Diagnosis not present

## 2021-04-22 DIAGNOSIS — M25551 Pain in right hip: Secondary | ICD-10-CM | POA: Diagnosis not present

## 2021-04-22 DIAGNOSIS — E1122 Type 2 diabetes mellitus with diabetic chronic kidney disease: Secondary | ICD-10-CM | POA: Diagnosis not present

## 2021-04-22 DIAGNOSIS — M25572 Pain in left ankle and joints of left foot: Secondary | ICD-10-CM | POA: Diagnosis not present

## 2021-04-22 DIAGNOSIS — Z9841 Cataract extraction status, right eye: Secondary | ICD-10-CM | POA: Diagnosis not present

## 2021-04-22 DIAGNOSIS — M84451A Pathological fracture, right femur, initial encounter for fracture: Secondary | ICD-10-CM | POA: Diagnosis not present

## 2021-04-22 DIAGNOSIS — F17210 Nicotine dependence, cigarettes, uncomplicated: Secondary | ICD-10-CM | POA: Diagnosis present

## 2021-04-22 DIAGNOSIS — R0902 Hypoxemia: Secondary | ICD-10-CM | POA: Diagnosis not present

## 2021-04-22 DIAGNOSIS — S72001A Fracture of unspecified part of neck of right femur, initial encounter for closed fracture: Secondary | ICD-10-CM | POA: Diagnosis present

## 2021-04-22 DIAGNOSIS — Y92009 Unspecified place in unspecified non-institutional (private) residence as the place of occurrence of the external cause: Secondary | ICD-10-CM

## 2021-04-22 DIAGNOSIS — W1830XA Fall on same level, unspecified, initial encounter: Secondary | ICD-10-CM | POA: Diagnosis present

## 2021-04-22 DIAGNOSIS — I10 Essential (primary) hypertension: Secondary | ICD-10-CM | POA: Diagnosis not present

## 2021-04-22 DIAGNOSIS — N183 Chronic kidney disease, stage 3 unspecified: Secondary | ICD-10-CM | POA: Diagnosis present

## 2021-04-22 DIAGNOSIS — E785 Hyperlipidemia, unspecified: Secondary | ICD-10-CM | POA: Diagnosis present

## 2021-04-22 DIAGNOSIS — I2510 Atherosclerotic heart disease of native coronary artery without angina pectoris: Secondary | ICD-10-CM | POA: Diagnosis present

## 2021-04-22 DIAGNOSIS — Z20822 Contact with and (suspected) exposure to covid-19: Secondary | ICD-10-CM | POA: Diagnosis present

## 2021-04-22 DIAGNOSIS — E876 Hypokalemia: Secondary | ICD-10-CM | POA: Diagnosis present

## 2021-04-22 DIAGNOSIS — S0990XA Unspecified injury of head, initial encounter: Secondary | ICD-10-CM | POA: Diagnosis not present

## 2021-04-22 DIAGNOSIS — I129 Hypertensive chronic kidney disease with stage 1 through stage 4 chronic kidney disease, or unspecified chronic kidney disease: Secondary | ICD-10-CM | POA: Diagnosis not present

## 2021-04-22 DIAGNOSIS — Z741 Need for assistance with personal care: Secondary | ICD-10-CM | POA: Diagnosis not present

## 2021-04-22 DIAGNOSIS — Z7982 Long term (current) use of aspirin: Secondary | ICD-10-CM | POA: Diagnosis not present

## 2021-04-22 DIAGNOSIS — Z9842 Cataract extraction status, left eye: Secondary | ICD-10-CM | POA: Diagnosis not present

## 2021-04-22 DIAGNOSIS — Z79899 Other long term (current) drug therapy: Secondary | ICD-10-CM

## 2021-04-22 DIAGNOSIS — Z471 Aftercare following joint replacement surgery: Secondary | ICD-10-CM | POA: Diagnosis not present

## 2021-04-22 DIAGNOSIS — Z01818 Encounter for other preprocedural examination: Secondary | ICD-10-CM | POA: Diagnosis not present

## 2021-04-22 DIAGNOSIS — Z888 Allergy status to other drugs, medicaments and biological substances status: Secondary | ICD-10-CM | POA: Diagnosis not present

## 2021-04-22 DIAGNOSIS — Z043 Encounter for examination and observation following other accident: Secondary | ICD-10-CM | POA: Diagnosis not present

## 2021-04-22 DIAGNOSIS — R9431 Abnormal electrocardiogram [ECG] [EKG]: Secondary | ICD-10-CM | POA: Diagnosis not present

## 2021-04-22 DIAGNOSIS — Z01811 Encounter for preprocedural respiratory examination: Secondary | ICD-10-CM

## 2021-04-22 DIAGNOSIS — W19XXXA Unspecified fall, initial encounter: Secondary | ICD-10-CM

## 2021-04-22 DIAGNOSIS — S72009A Fracture of unspecified part of neck of unspecified femur, initial encounter for closed fracture: Secondary | ICD-10-CM

## 2021-04-22 IMAGING — CT CT CERVICAL SPINE W/O CM
3 of 4 series · 10 of 33 positions shown, 11 images · non-contrast
Comparison: None.

CLINICAL DATA: Status post fall.

EXAM:
CT CERVICAL SPINE WITHOUT CONTRAST
TECHNIQUE: Multidetector CT imaging of the cervical spine was performed without
intravenous contrast. Multiplanar CT image reconstructions were also
generated.

[Series 6: orthogonal bone · axial · 0.23mm/px · z∈[-330,-224]mm · 2 of 144 slices shown, 3 images]
[im 41/144  soft-tissue]
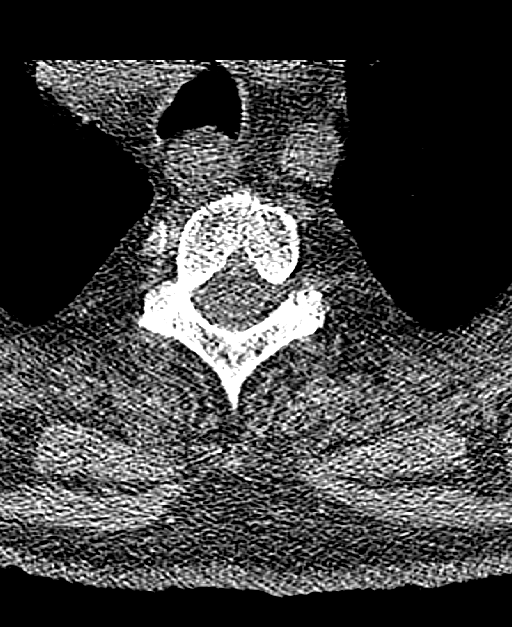
[im 41/144  bone]
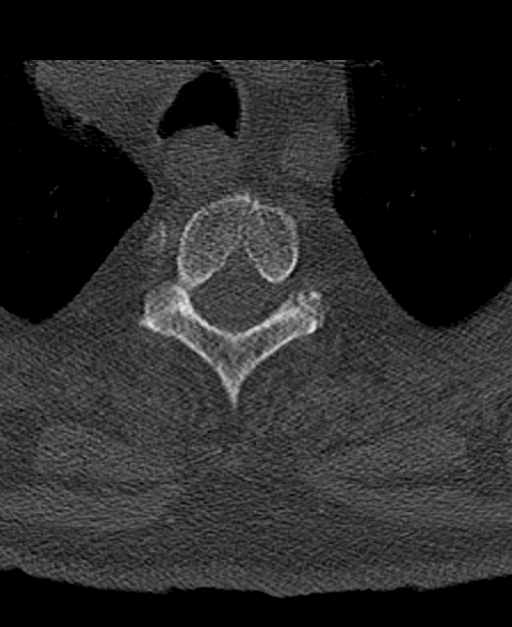
[im 103/144  bone]
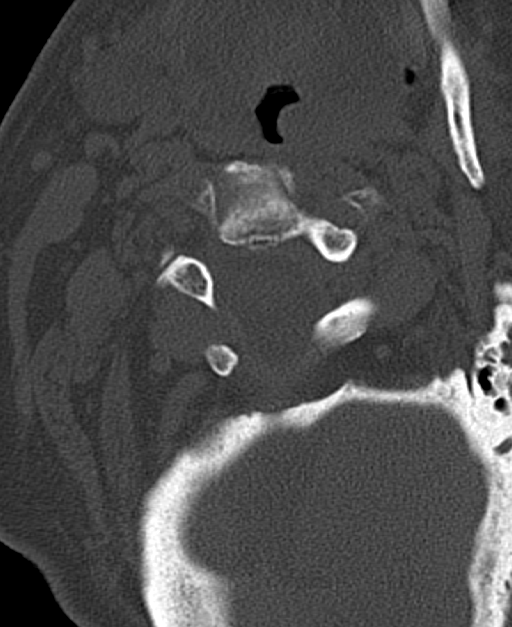

[Series 8: sagittal bone · sagittal · 0.29mm/px · 5 of 61 slices shown]
[im 21/61  bone]
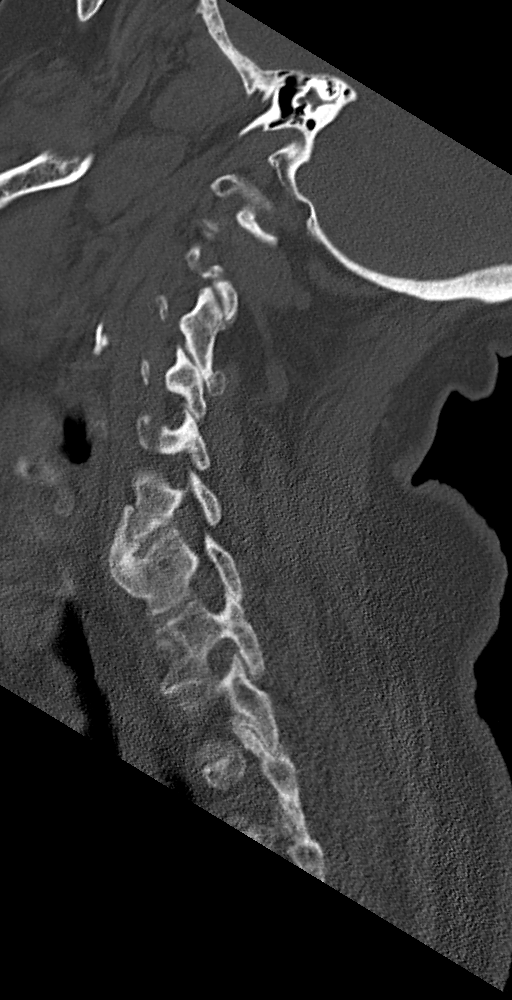
[im 26/61  bone]
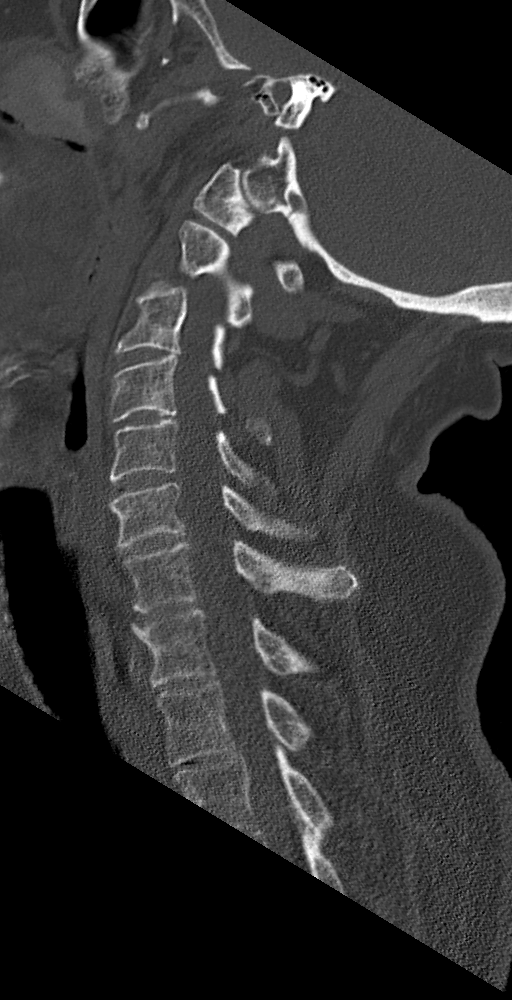
[im 31/61  bone]
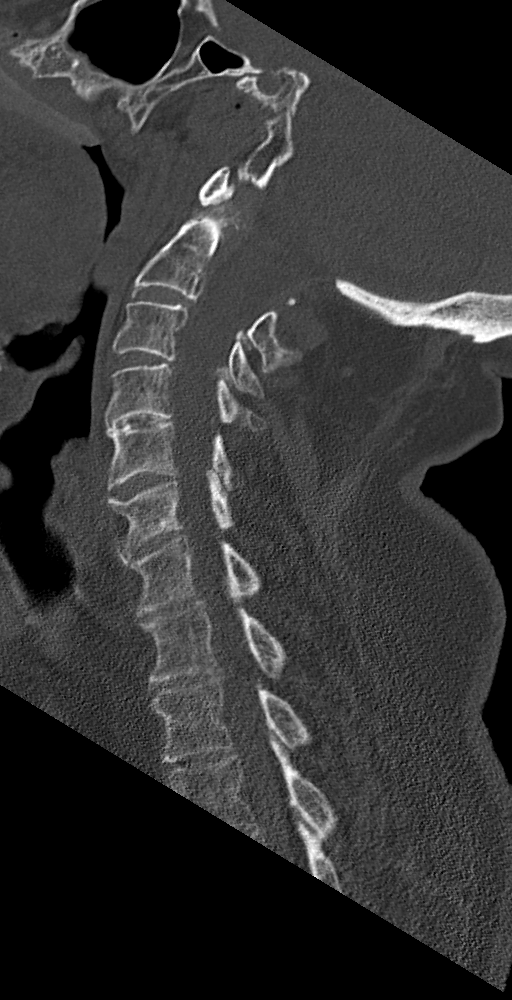
[im 36/61  bone]
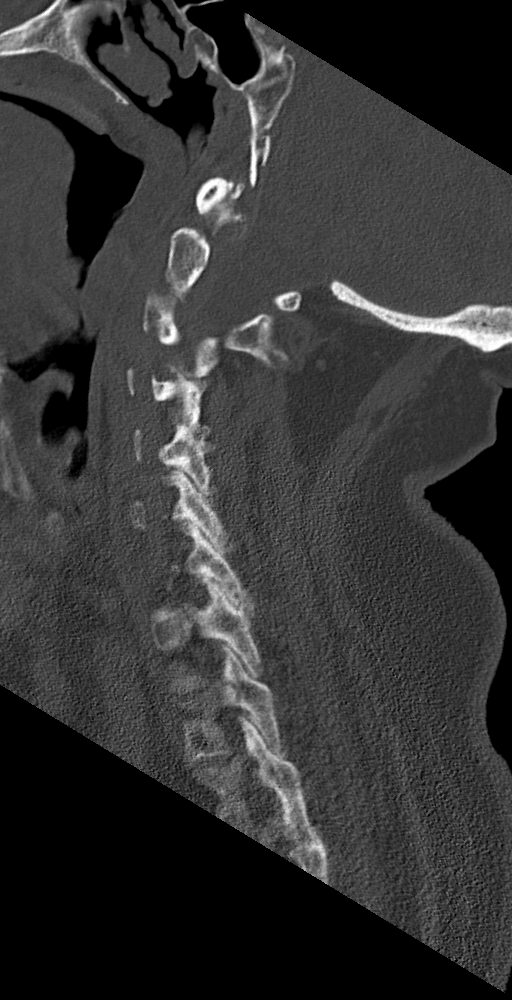
[im 41/61  bone]
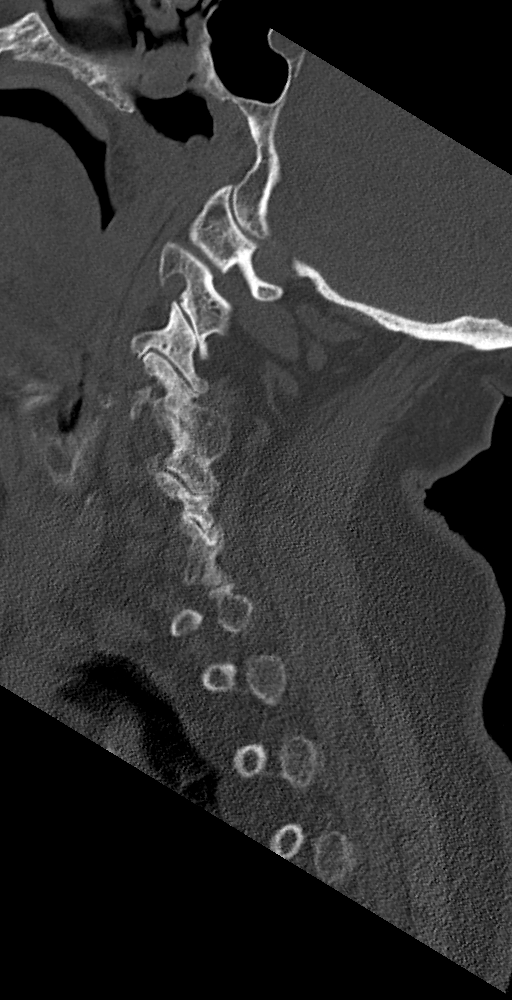

[Series 9: coronal bone 1 · coronal · 0.23mm/px · 3 of 74 slices shown]
[im 23/74  bone]
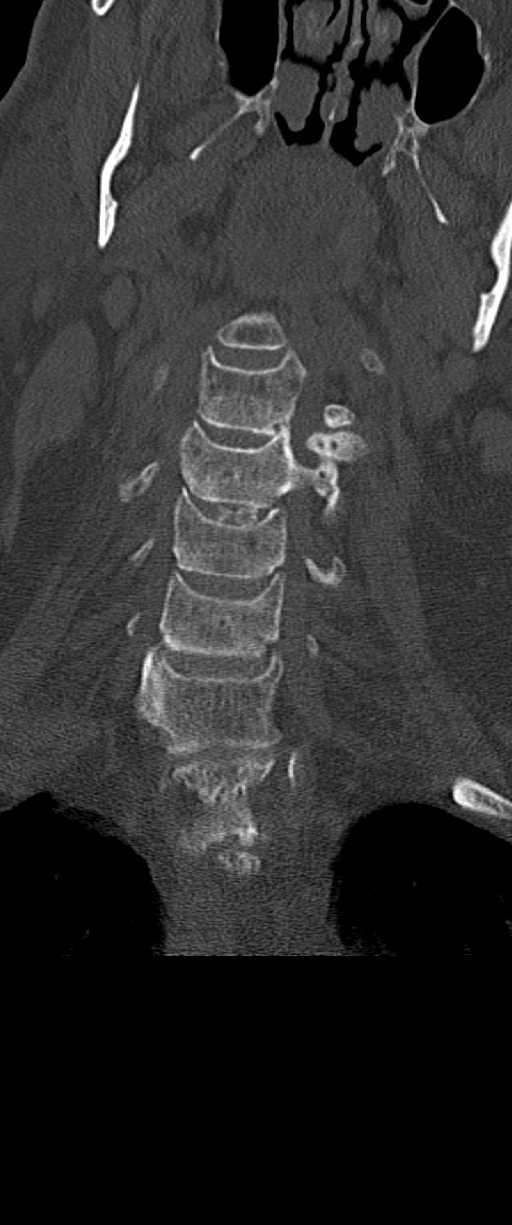
[im 32/74  bone]
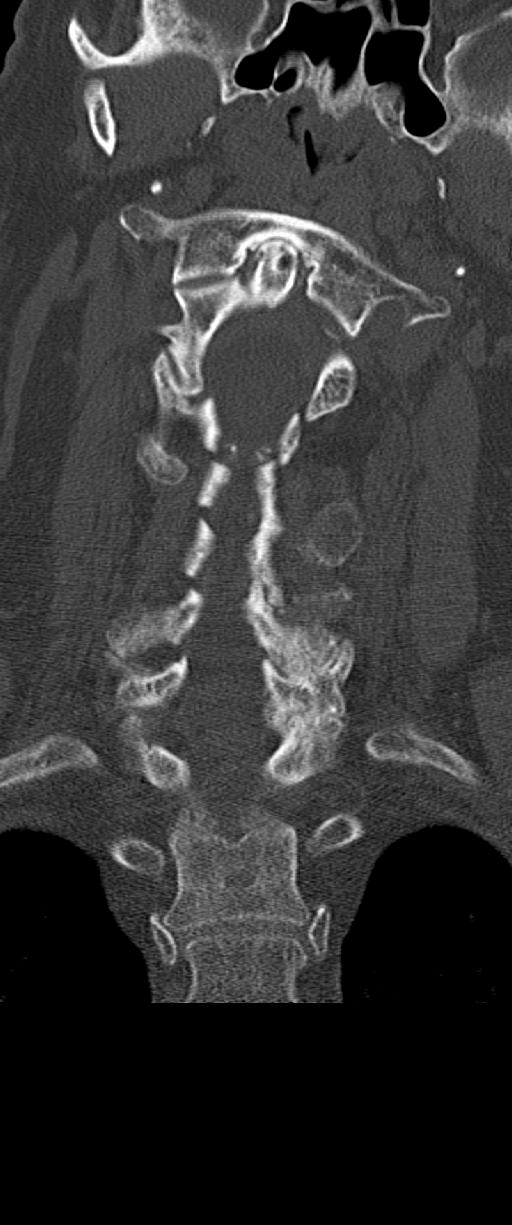
[im 42/74  bone]
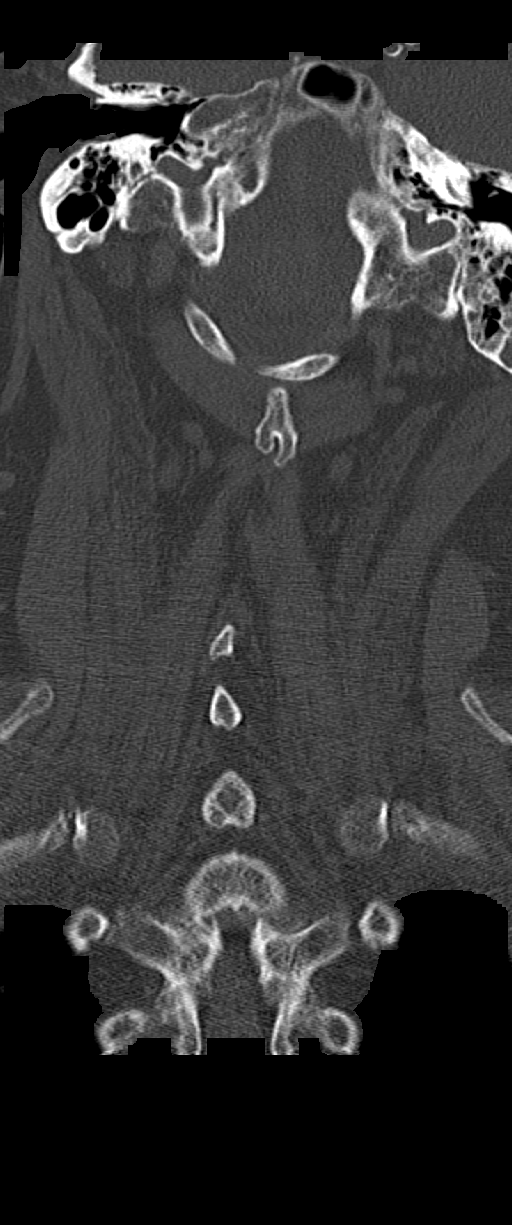

[10 of 33 positions shown; findings below may reference images not displayed]

FINDINGS: Alignment: Normal.

Skull base and vertebrae: No acute fracture. Chronic changes are
seen involving the body and tip of the dens.

Soft tissues and spinal canal: No prevertebral fluid or swelling. No
visible canal hematoma.

Disc levels: Mild to moderate severity multilevel endplate sclerosis
and osteophyte formation is seen. This is most prominent at the
level of C6-C7.

There is marked severity narrowing of the anterior atlantoaxial
articulation. Mild multilevel intervertebral disc space narrowing is
noted, most prominent at the level of C4-C5.

Bilateral marked severity multilevel facet joint hypertrophy is
noted.

Upper chest: Negative.

Other: None.
IMPRESSION: Multilevel degenerative changes without an acute cervical spine
fracture.

## 2021-04-22 IMAGING — CR DG FEMUR 2+V*R*
4 series · 4 of 4 positions shown · non-contrast
Comparison: None.

CLINICAL DATA: Status post fall.

EXAM:
RIGHT FEMUR 2 VIEWS

[x pelvis]
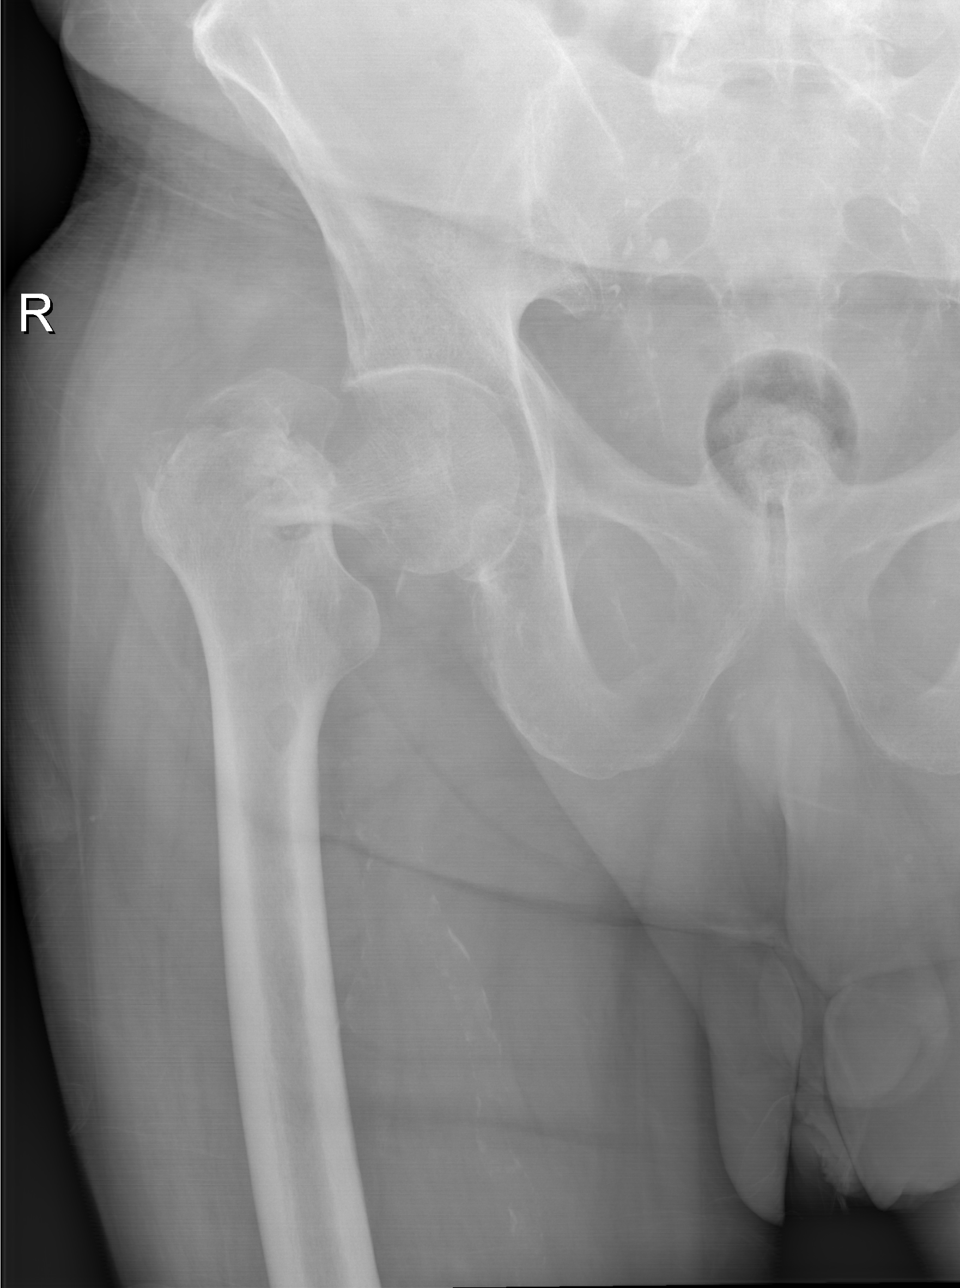

[x femur proximal ap right]
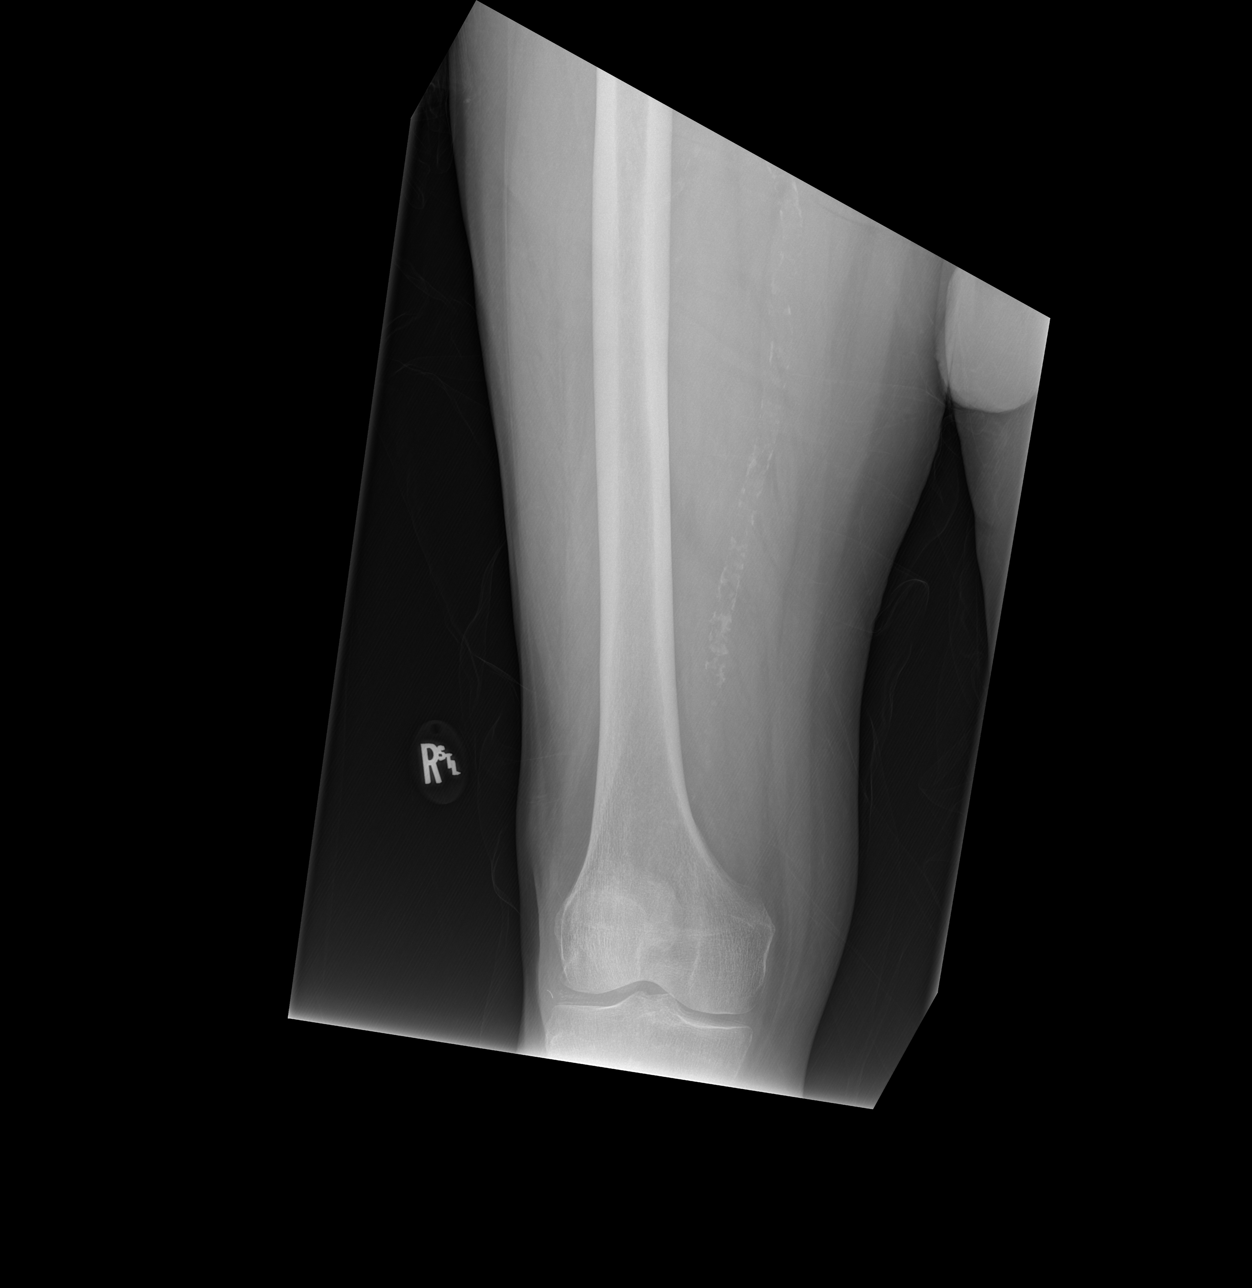

[x femur distal lat right]
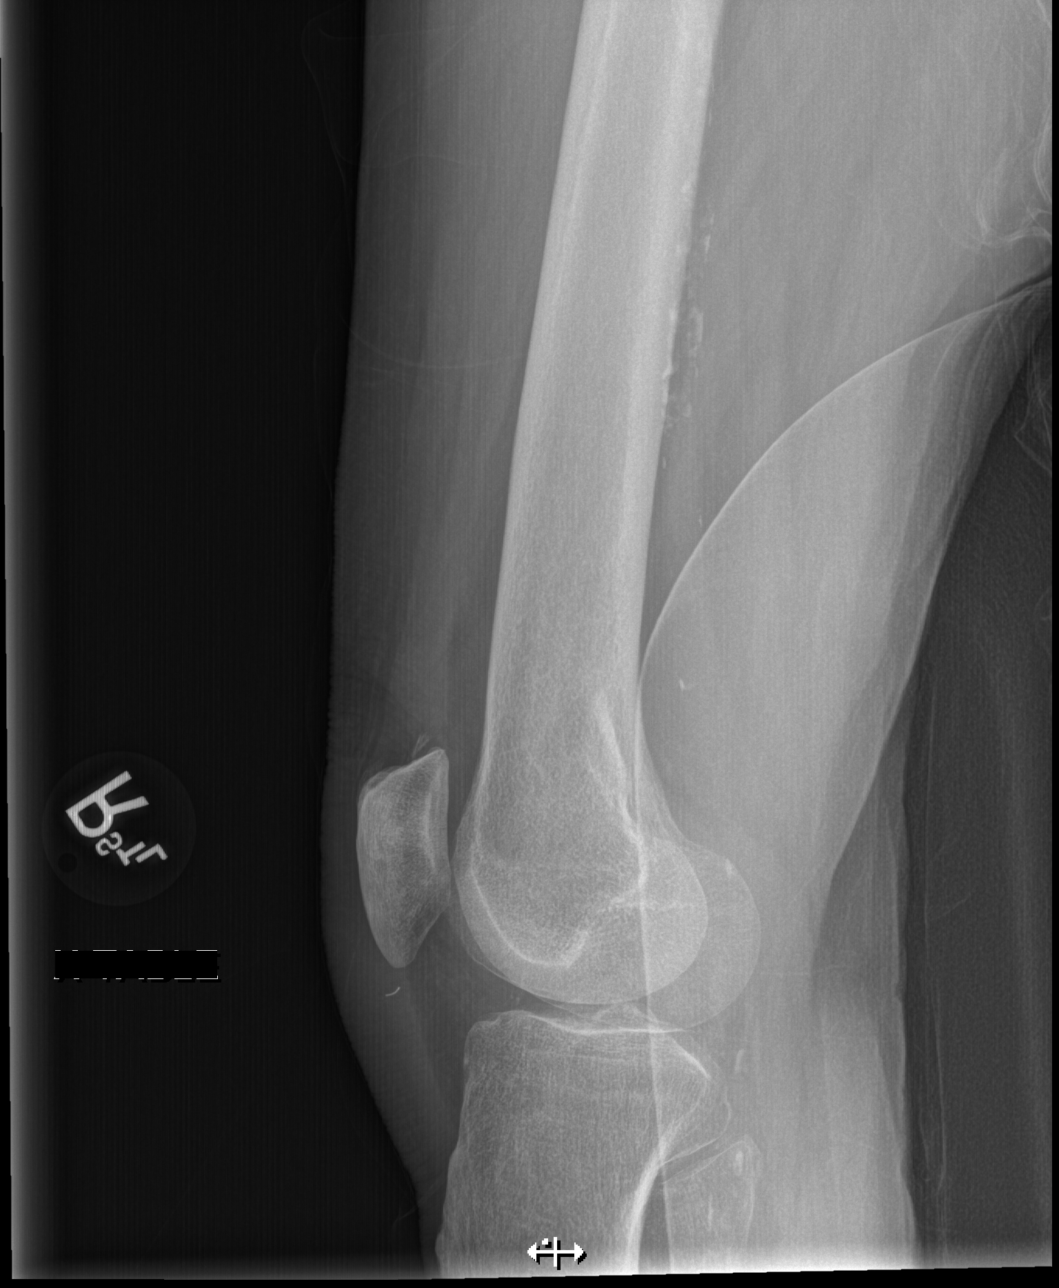

[w hip lat right]
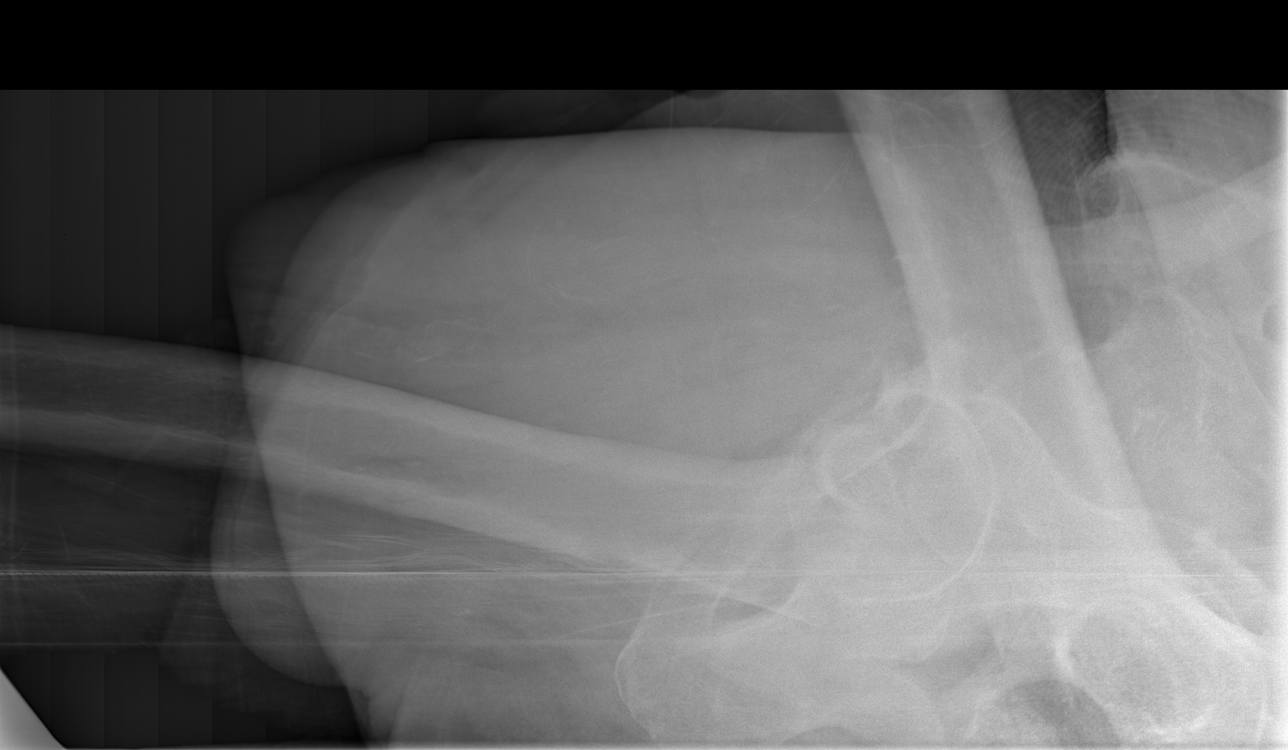

[4 of 4 positions shown; findings below may reference images not displayed]

FINDINGS: An acute fracture deformity is seen extending through the neck of
the proximal right femur. Mild lateral displacement of the distal
fracture site is noted. There is no evidence of dislocation. A 14 mm
x 8 mm well-defined, benign-appearing lucency is seen within the
medullary region of the proximal right femoral shaft. Soft tissue
structures are remarkable for mild to moderate severity vascular
calcification.
IMPRESSION: Acute fracture of the proximal right femur.

## 2021-04-22 IMAGING — CT CT HEAD W/O CM
3 series · 15 of 47 positions shown, 18 images · non-contrast
Comparison: [DATE]

CLINICAL DATA: Status post fall.

EXAM:
CT HEAD WITHOUT CONTRAST
TECHNIQUE: Contiguous axial images were obtained from the base of the skull
through the vertex without intravenous contrast.

[Series 4: head wo · axial · 0.47mm/px · z∈[-159,-19]mm · 9 of 34 slices shown, 12 images]
[im 3/34  brain]
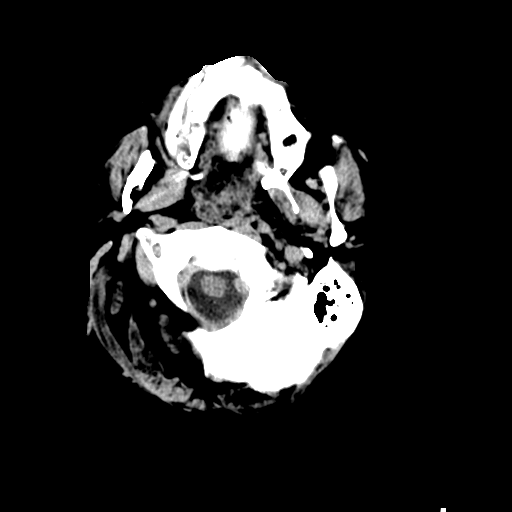
[im 3/34  bone]
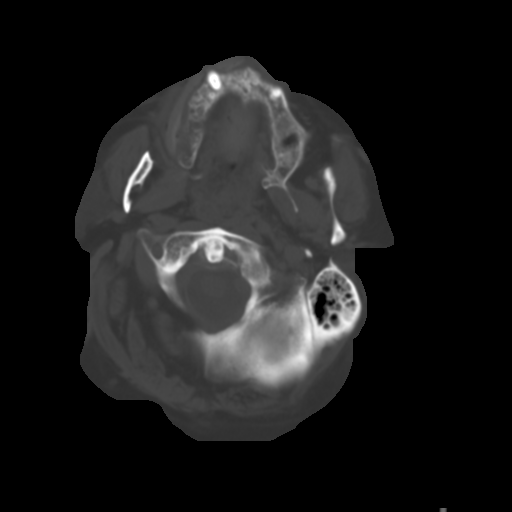
[im 6/34  brain]
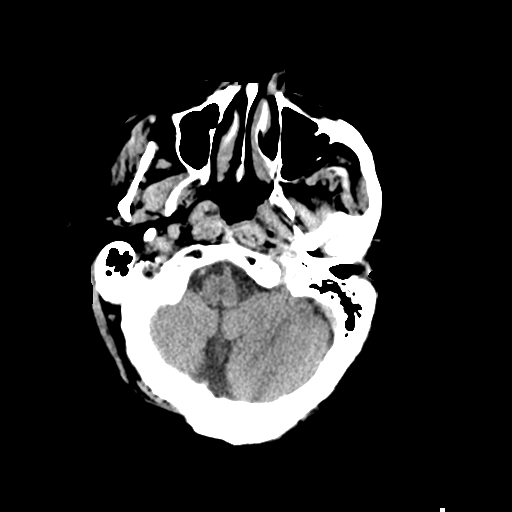
[im 10/34  brain]
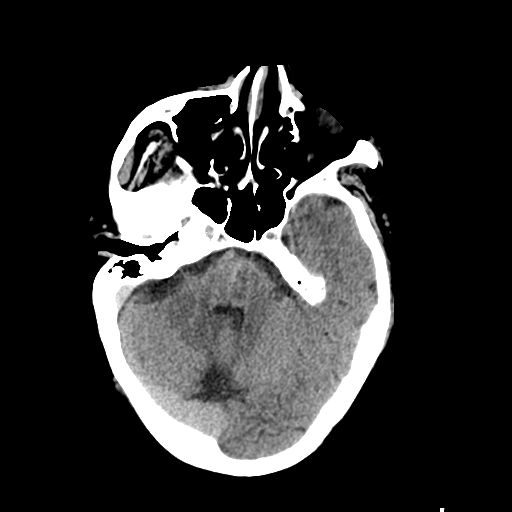
[im 13/34  brain]
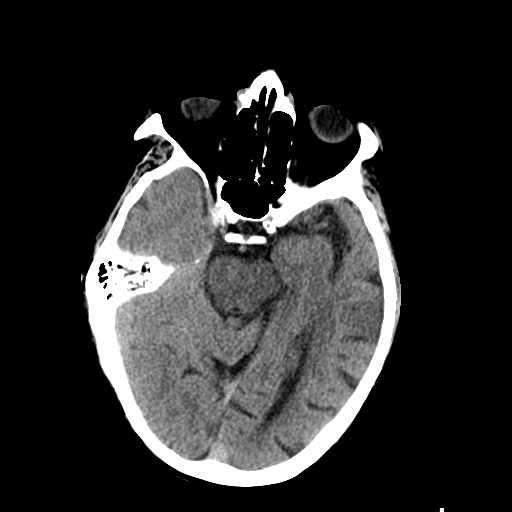
[im 18/34  brain]
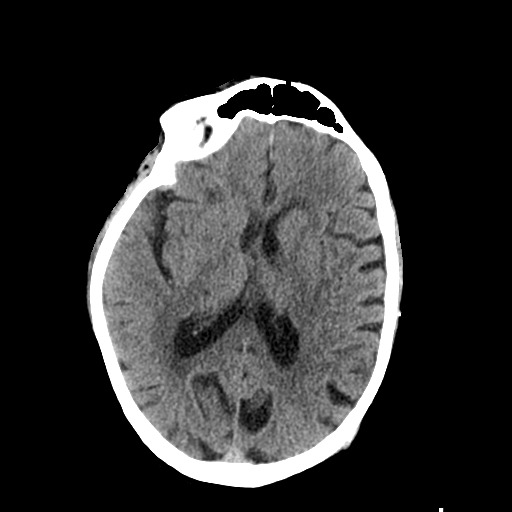
[im 18/34  bone]
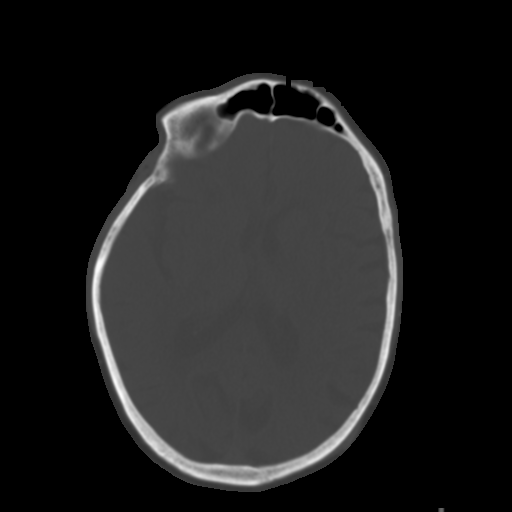
[im 21/34  brain]
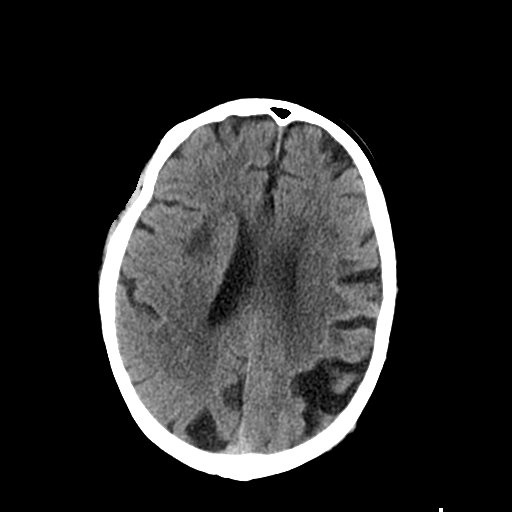
[im 24/34  brain]
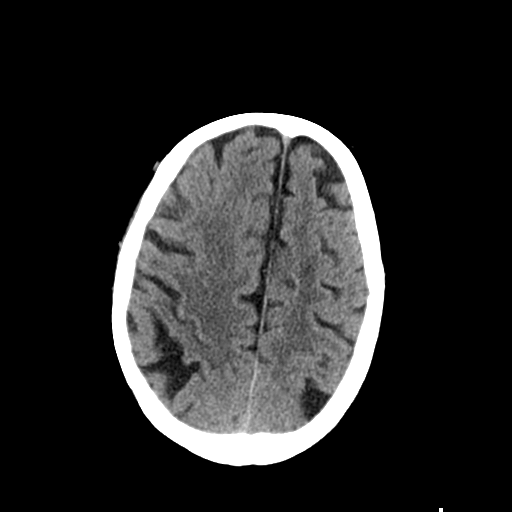
[im 28/34  brain]
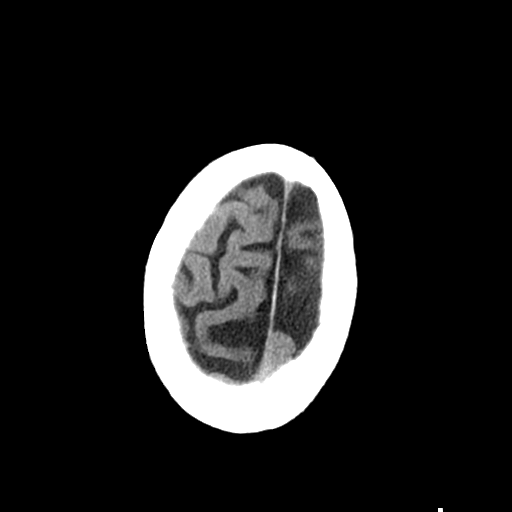
[im 31/34  brain]
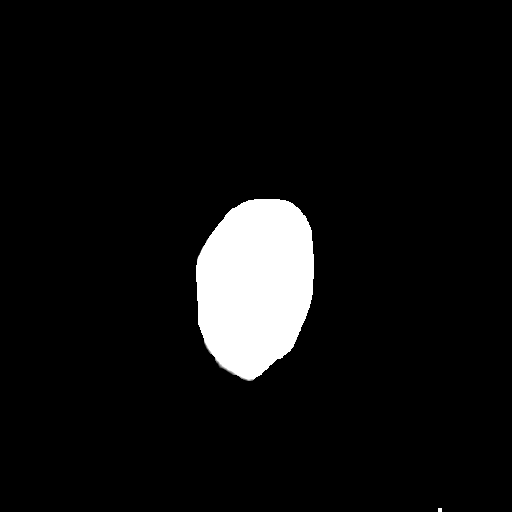
[im 31/34  bone]
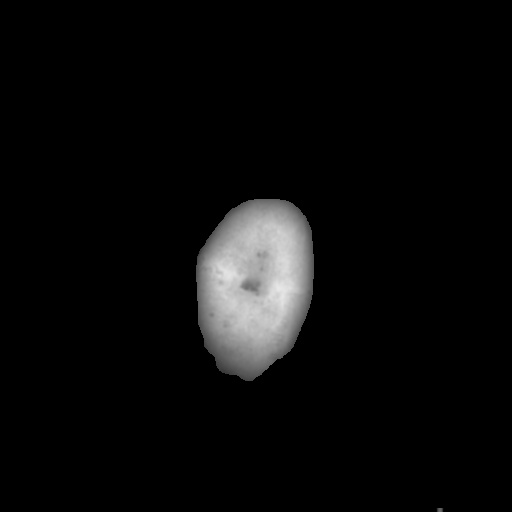

[Series 6: coronal soft tissue · coronal · 0.33mm/px · 3 of 77 slices shown]
[im 26/77  brain]
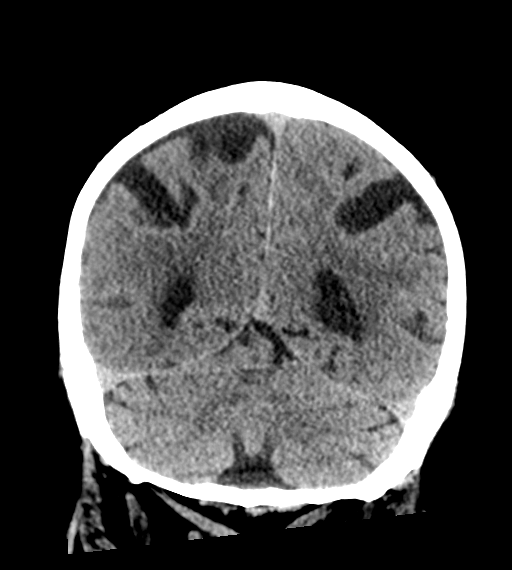
[im 34/77  brain]
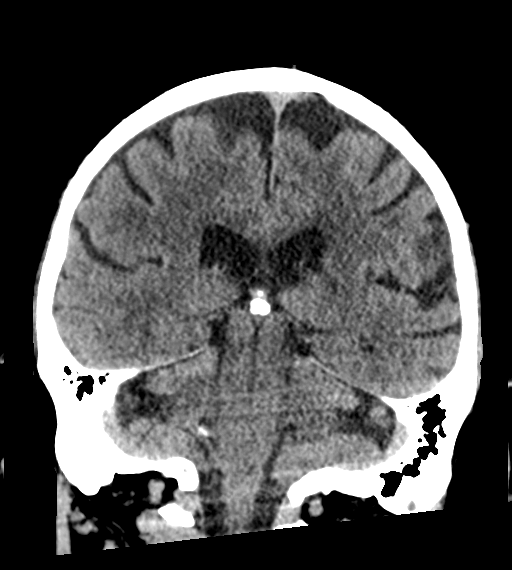
[im 43/77  brain]
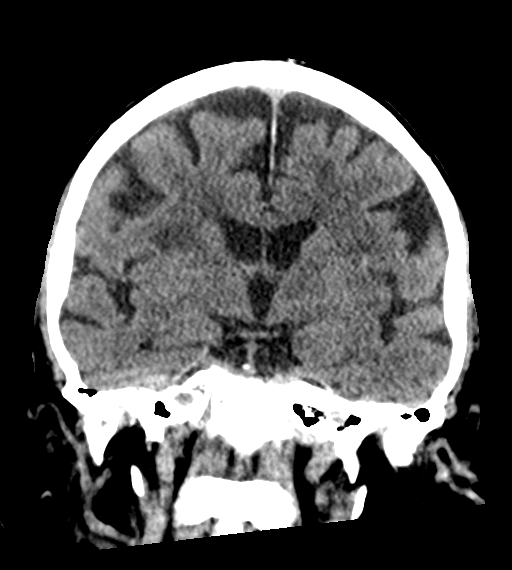

[Series 7: sagittal soft tissue · sagittal · 0.37mm/px · 3 of 56 slices shown]
[im 20/56  brain]
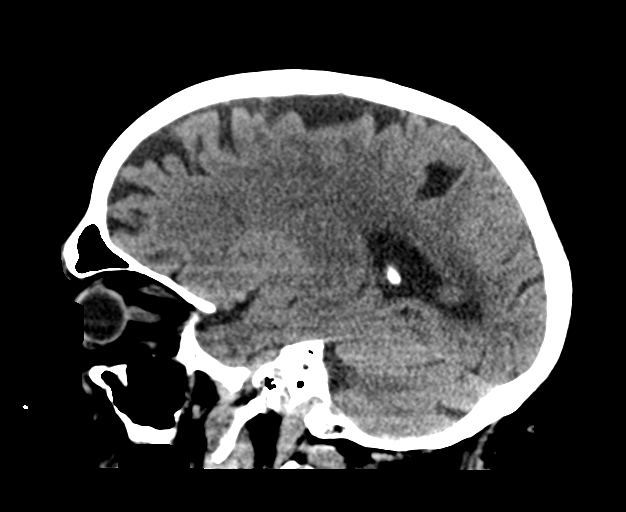
[im 28/56  brain]
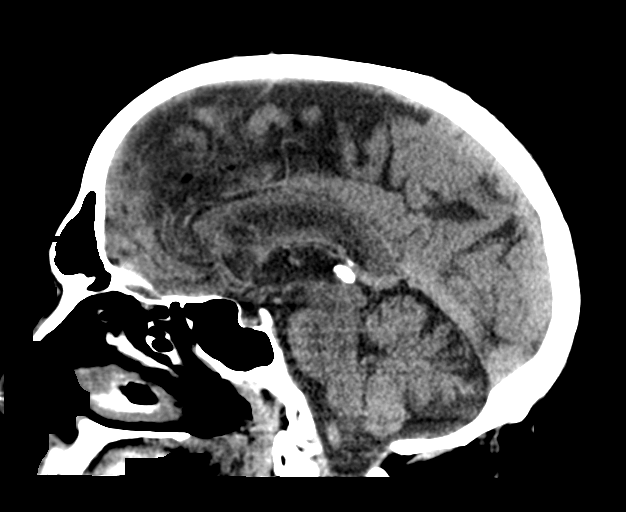
[im 36/56  brain]
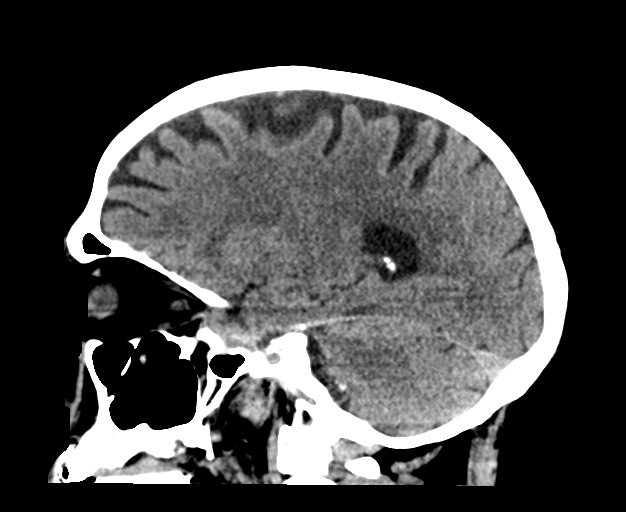

[15 of 47 positions shown; findings below may reference images not displayed]

FINDINGS: Brain: There is mild cerebral atrophy with widening of the
extra-axial spaces and ventricular dilatation.
There are areas of decreased attenuation within the white matter
tracts of the supratentorial brain, consistent with microvascular
disease changes. This is most prominent within the anterior aspect
of the right parietal region and left occipital lobe and is
increased in severity when compared to the prior exam.

A small, chronic right basal ganglia lacunar infarct is seen.

Vascular: No hyperdense vessel or unexpected calcification.

Skull: Normal. Negative for fracture or focal lesion.

Sinuses/Orbits: No acute finding.

Other: None.
IMPRESSION: 1. Generalized cerebral atrophy and chronic microvascular white
matter disease.
2. No acute intracranial abnormality.

## 2021-04-22 IMAGING — CR DG PORTABLE PELVIS
1 series · 1 of 1 positions shown · non-contrast
Comparison: None.

CLINICAL DATA: Status post fall.

EXAM:
PORTABLE PELVIS 1-2 VIEWS

[x pelvis]
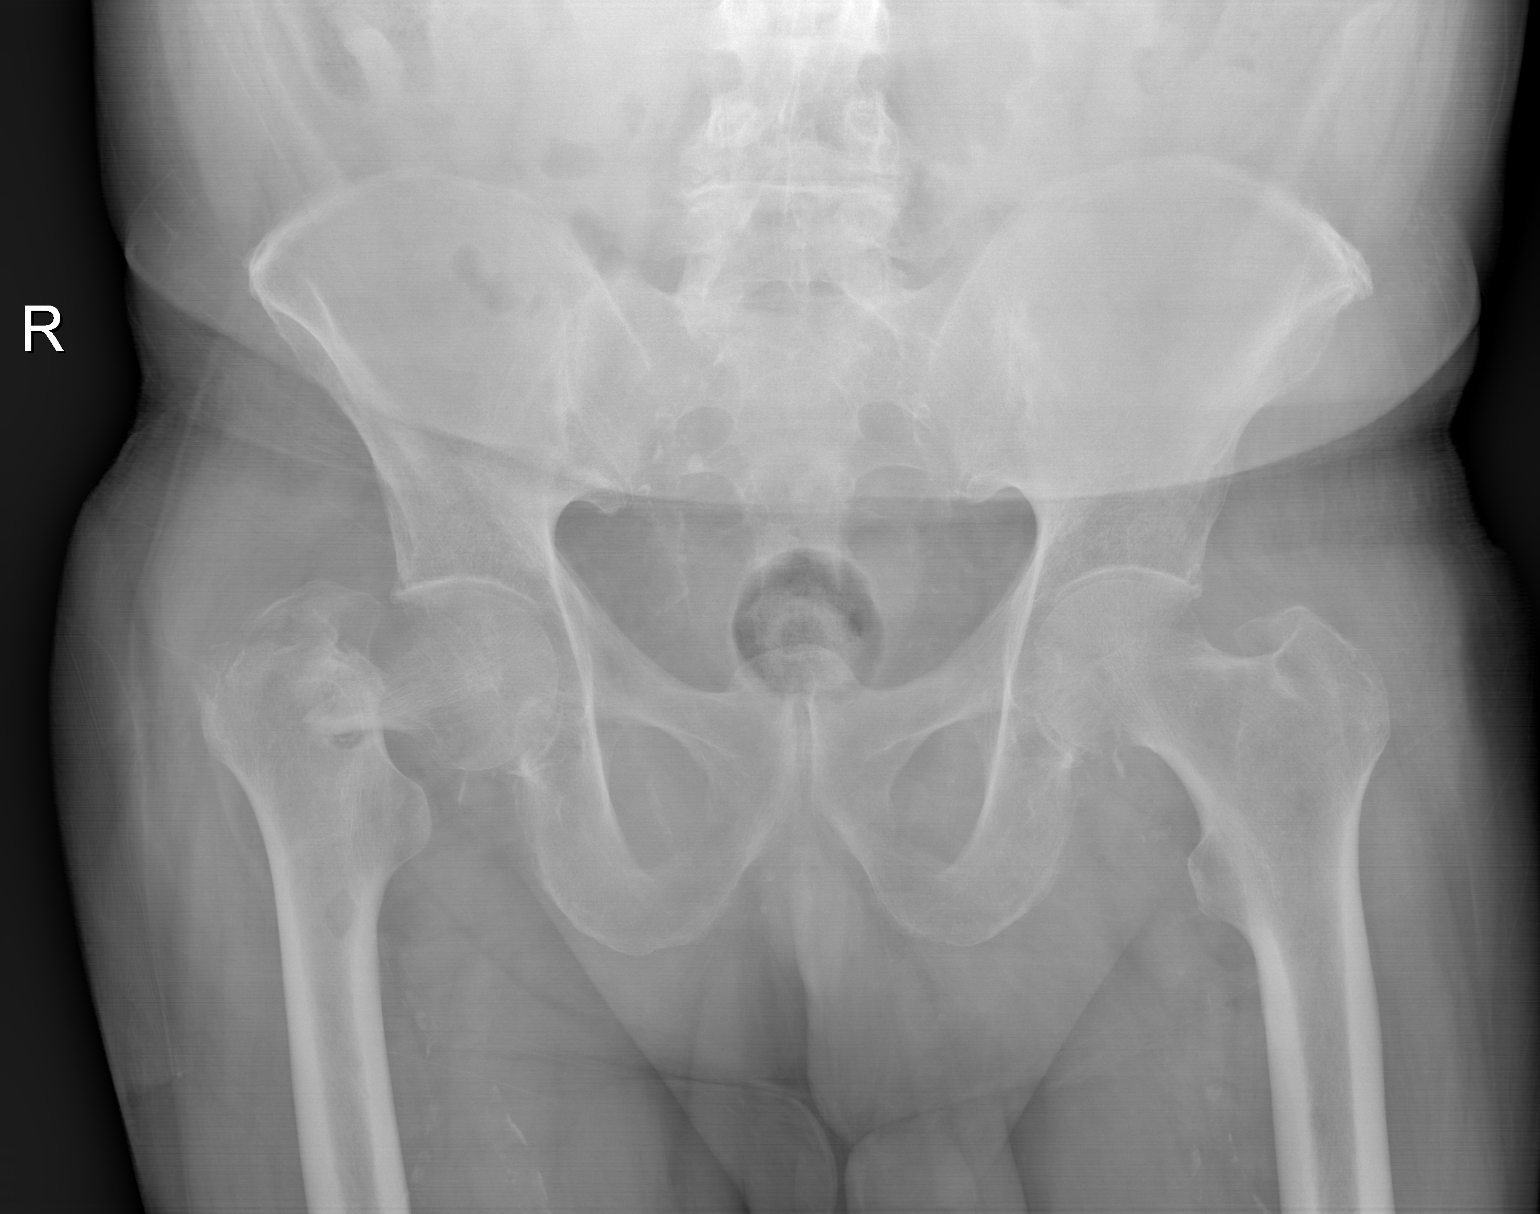

[1 of 1 positions shown; findings below may reference images not displayed]

FINDINGS: An acute fracture deformity is seen extending through the neck of
the proximal right femur. Mild lateral displacement of the distal
fracture site is noted. There is no evidence of dislocation. A 14 mm
x 8 mm well-defined, benign-appearing lucency is seen within the
medullary region of the proximal right femoral shaft. Soft tissue
structures are remarkable for mild to moderate severity vascular
calcification.
IMPRESSION: Acute fracture of the proximal right femur.

## 2021-04-22 MED ORDER — HYDROCODONE-ACETAMINOPHEN 5-325 MG PO TABS
1.0000 | ORAL_TABLET | Freq: Once | ORAL | Status: AC
  Administered 2021-04-22: 1 via ORAL
  Filled 2021-04-22: qty 1

## 2021-04-22 NOTE — Progress Notes (Signed)
75 year old ambulatory patient fell in the driveway today Right femoral neck fracture by plain radiographs with no other orthopedic complaints  Plan right total hip replacement tomorrow after medical admission.  Plan on full consult in a.m. prior to surgery  Appears generally healthy with no immediate contraindications to surgery tomorrow.

## 2021-04-22 NOTE — ED Provider Notes (Signed)
[facility] DEPT Provider Note   CSN: [id number] Arrival date & time: 04/22/21  2018     History No chief complaint on file.   Antonio Serrano is a 75 y.o. male.  HPI  75 year old male with history of DM, HTN, CKD who presented with right hip pain after falling at home.  The patient states that he was ambulating in his driveway when he lost his balance and sustained a mechanical fall onto his right hip.  He states that he experienced immediate pain and deformity in the right hip with inability to bear weight or stand.  EMS was called and noted slight shortening and external rotation of the right lower extremity.  Patient was then transported to [facility] for further care management.  The patient arrived GCS 15, ABC intact.  He denied head trauma or loss of consciousness.  He denied any other complaints other than sharp and shooting pain in his right hip.  Past Medical History:  Diagnosis Date   Atherosclerosis of coronary artery    On chest CT->calcif in LAD and RCA   Chronic prostatitis    Very mild elevation of PSA (>4), referred to [facility] where PSA repeat was 1.0 on 06/16/13.  Annual PSA repeat is all that is needed now per urologist.  Most recent 07/2015 was 0.45.   Chronic renal insufficiency, stage III (moderate) (HCC)    Baseline GFR as of 2019= 50s.     Diabetes mellitus with complication ([place]) Dx'd fall 2011   HTN (hypertension)    Renal/aortic doppler u/s normal 06/2010   Hypercalcemia    Hyperlipidemia 2016   Statin intolerant--myalgias.  Pt refuses any further trial of statin as of 2018.   Nonspecific abnormal electrocardiogram (ECG) (EKG) Fall 2011   TWI in inferior leads: myocardial perfusion scan neg and echo normal 07/2010   Tobacco dependence    UTI (lower urinary tract infection)    Feb 2012 (klebsiella--dx at [facility]); 03/2013 e coli.     Patient Active Problem List   Diagnosis Date Noted   Closed right  hip fracture, initial encounter ([place]) 04/23/2021   Diabetes mellitus with complication ([place]) [id number]   Hyperlipidemia 07/29/2015   Conjunctivitis 06/27/2015   Healthcare maintenance 06/27/2015   Type 2 diabetes mellitus with stage 3a chronic kidney disease, without long-term current use of insulin ([place]) 07/16/2014   Constipation, chronic 07/16/2014   Preventative health care 05/18/2014   Testicular pain, right 06/25/2013   Erectile dysfunction 05/31/2013   Chronic renal insufficiency, stage III (moderate) (HCC)    HTN (hypertension), benign 01/30/2012   Tobacco dependence 01/30/2012    Past Surgical History:  Procedure Laterality Date   CARDIOVASCULAR STRESS TEST  07/2010   Myocardial perfusion scan neg/low risk.   CATARACT EXTRACTION  2007, 2008   Bilateral ([facility] on [address].   COLONOSCOPY  12/20/04;12/2014   2016 no polyps: recall 5 yrs due to [place] of colon ca   ESOPHAGOGASTRODUODENOSCOPY  11/2004   esoph stricture/dilation   TRANSTHORACIC ECHOCARDIOGRAM  07/2010   EF =>55%; LA mildly dilated; trace MR/TR;        Family History  Problem Relation Age of Onset   Dementia Mother    Pneumonia Father        died in 55's of pneumonia, was otherwise healthy   Cancer Sister        colon cancer.  Died age 59   Diabetes Paternal Aunt    Diabetes Sister  Colon cancer Neg Hx     Social History   Tobacco Use   Smoking status: Every Day    Packs/day: 0.25    Years: 50.00    Pack years: 12.50    Types: Cigarettes   Smokeless tobacco: Never  Vaping Use   Vaping Use: Never used  Substance Use Topics   Alcohol use: Not Currently    Alcohol/week: 0.0 standard drinks    Comment: rare alcohol   Drug use: No    Home Medications Prior to Admission medications   Medication Sig Start Date End Date Taking? Authorizing Provider  aspirin EC 81 MG tablet Take 81 mg by mouth daily.   Yes [provider]  carvedilol (COREG) 6.25 MG tablet TAKE 1  TABLET BY MOUTH TWICE DAILY WITH A MEAL 11/30/20  Yes [name], Adrian Blackwater, MD  furosemide (LASIX) 40 MG tablet TAKE 1 TABLET BY MOUTH ONCE DAILY . APPOINTMENT REQUIRED FOR FUTURE REFILLS 03/17/21  Yes [name], Adrian Blackwater, MD  ipratropium (ATROVENT) 0.03 % nasal spray USE 2 SPRAY(S) IN EACH NOSTRIL EVERY 12 HOURS 01/24/21  Yes [name], Adrian Blackwater, MD  olmesartan (BENICAR) 20 MG tablet Take 1/2 (one-half) tablet by mouth once daily 11/24/20  Yes [name], Adrian Blackwater, MD  pioglitazone (ACTOS) 15 MG tablet TAKE 1/2 TO 1 (ONE-HALF TO ONE) TABLET BY MOUTH ONCE DAILY 02/23/21  Yes [name], Adrian Blackwater, MD    Allergies    Bactrim [sulfamethoxazole-trimethoprim]  Review of Systems   Review of Systems  Constitutional:  Negative for chills and fever.  HENT:  Negative for ear pain and sore throat.   Eyes:  Negative for pain and visual disturbance.  Respiratory:  Negative for cough and shortness of breath.   Cardiovascular:  Negative for chest pain and palpitations.  Gastrointestinal:  Negative for abdominal pain and vomiting.  Genitourinary:  Negative for dysuria and hematuria.  Musculoskeletal:  Positive for arthralgias. Negative for back pain.  Skin:  Negative for color change and rash.  Neurological:  Negative for seizures and syncope.  All other systems reviewed and are negative.  Physical Exam Updated Vital Signs BP 131/78 (BP Location: Left Arm)   Pulse 63   Temp 97.6 F (36.4 C) (Oral)   Resp 18   Ht '5\' 6"'$  (1.676 m)   Wt 74.8 kg   SpO2 99%   BMI 26.63 kg/m   Physical Exam Vitals and nursing note reviewed.  Constitutional:      Appearance: He is well-developed.     Comments: GCS 15, ABC intact  HENT:     Head: Normocephalic and atraumatic.  Eyes:     Conjunctiva/sclera: Conjunctivae normal.  Neck:     Comments: No midline tenderness to palpation of the cervical spine, range of motion intact Cardiovascular:     Rate and Rhythm: Normal rate and regular rhythm.     Heart sounds: No murmur  heard. Pulmonary:     Effort: Pulmonary effort is normal. No respiratory distress.     Breath sounds: Normal breath sounds.  Chest:     Comments: Chest wall nontender to palpation, clavicles stable and nontender to palpation Abdominal:     Palpations: Abdomen is soft.     Tenderness: There is no abdominal tenderness.  Musculoskeletal:        General: Tenderness and deformity present.     Cervical back: Neck supple.     Comments: No midline tenderness palpation of the thoracic or lumbar spine.  Tenderness palpation of the right  hip about the right greater trochanter.  Right lower extremity shortened and externally rotated, distally neurovascularly intact.  Skin:    General: Skin is warm and dry.  Neurological:     Mental Status: He is alert.    ED Results / Procedures / Treatments   Labs (all labs ordered are listed, but only abnormal results are displayed) Labs Reviewed  CBC - Abnormal; Notable for the following components:      Result Value   WBC 11.2 (*)    All other components within normal limits  COMPREHENSIVE METABOLIC PANEL - Abnormal; Notable for the following components:   Sodium 132 (*)    Chloride 96 (*)    Glucose, Bld 143 (*)    Creatinine, Ser 1.49 (*)    GFR, Estimated 49 (*)    All other components within normal limits  APTT - Abnormal; Notable for the following components:   aPTT 38 (*)    All other components within normal limits  BASIC METABOLIC PANEL - Abnormal; Notable for the following components:   Sodium 133 (*)    Potassium 3.4 (*)    Chloride 97 (*)    Glucose, Bld 125 (*)    Creatinine, Ser 1.27 (*)    GFR, Estimated 60 (*)    All other components within normal limits  GLUCOSE, CAPILLARY - Abnormal; Notable for the following components:   Glucose-Capillary 135 (*)    All other components within normal limits  GLUCOSE, CAPILLARY - Abnormal; Notable for the following components:   Glucose-Capillary 166 (*)    All other components within  normal limits  RESP PANEL BY RT-PCR (FLU A&B, COVID) [id number]  PROTIME-INR  CBC  CBC  BASIC METABOLIC PANEL  MAGNESIUM  PHOSPHORUS  TYPE AND SCREEN  ABO/RH    EKG None  Radiology CT HEAD WO CONTRAST  Result Date: 04/22/2021 CLINICAL DATA:  Status post fall. EXAM: CT HEAD WITHOUT CONTRAST TECHNIQUE: Contiguous axial images were obtained from the base of the skull through the vertex without intravenous contrast. COMPARISON:  August 09, 2010 FINDINGS: Brain: There is mild cerebral atrophy with widening of the extra-axial spaces and ventricular dilatation. There are areas of decreased attenuation within the white matter tracts of the supratentorial brain, consistent with microvascular disease changes. This is most prominent within the anterior aspect of the right parietal region and left occipital lobe and is increased in severity when compared to the prior exam. A small, chronic right basal ganglia lacunar infarct is seen. Vascular: No hyperdense vessel or unexpected calcification. Skull: Normal. Negative for fracture or focal lesion. Sinuses/Orbits: No acute finding. Other: None. IMPRESSION: 1. Generalized cerebral atrophy and chronic microvascular white matter disease. 2. No acute intracranial abnormality. Electronically Signed   By: Virgina Norfolk M.D.   On: 04/22/2021 22:22   CT CERVICAL SPINE WO CONTRAST  Result Date: 04/22/2021 CLINICAL DATA:  Status post fall. EXAM: CT CERVICAL SPINE WITHOUT CONTRAST TECHNIQUE: Multidetector CT imaging of the cervical spine was performed without intravenous contrast. Multiplanar CT image reconstructions were also generated. COMPARISON:  None. FINDINGS: Alignment: Normal. Skull base and vertebrae: No acute fracture. Chronic changes are seen involving the body and tip of the dens. Soft tissues and spinal canal: No prevertebral fluid or swelling. No visible canal hematoma. Disc levels: Mild to moderate severity multilevel endplate sclerosis and osteophyte  formation is seen. This is most prominent at the level of C6-C7. There is marked severity narrowing of the anterior atlantoaxial articulation. Mild multilevel intervertebral disc space  narrowing is noted, most prominent at the level of C4-C5. Bilateral marked severity multilevel facet joint hypertrophy is noted. Upper chest: Negative. Other: None. IMPRESSION: Multilevel degenerative changes without an acute cervical spine fracture. Electronically Signed   By: Virgina Norfolk M.D.   On: 04/22/2021 22:28   Pelvis Portable  Result Date: 04/23/2021 CLINICAL DATA:  History of right femoral neck fracture. EXAM: PORTABLE PELVIS 1-2 VIEWS COMPARISON:  Right hip radiographs dated 04/22/2021. FINDINGS: Intraoperative fluoroscopy images demonstrate postoperative appearance of a total right hip arthroplasty. No evidence of hardware failure or malalignment. IMPRESSION: Postoperative appearance of total right hip arthroplasty. Electronically Signed   By: Zerita Boers M.D.   On: 04/23/2021 11:47   DG Pelvis Portable  Result Date: 04/22/2021 CLINICAL DATA:  Status post fall. EXAM: PORTABLE PELVIS 1-2 VIEWS COMPARISON:  None. FINDINGS: An acute fracture deformity is seen extending through the neck of the proximal right femur. Mild lateral displacement of the distal fracture site is noted. There is no evidence of dislocation. A 14 mm x 8 mm well-defined, benign-appearing lucency is seen within the medullary region of the proximal right femoral shaft. Soft tissue structures are remarkable for mild to moderate severity vascular calcification. IMPRESSION: Acute fracture of the proximal right femur. Electronically Signed   By: Virgina Norfolk M.D.   On: 04/22/2021 22:15   Chest Portable 1 View  Result Date: 04/23/2021 CLINICAL DATA:  Preoperative chest x-ray for right hip fracture EXAM: PORTABLE CHEST 1 VIEW COMPARISON:  None. FINDINGS: No consolidation, features of edema, pneumothorax, or effusion. Pulmonary vascularity  is normally distributed. The cardiomediastinal contours are unremarkable. No acute osseous or soft tissue abnormality. IMPRESSION: No acute cardiopulmonary abnormality. Electronically Signed   By: Lovena Le M.D.   On: 04/23/2021 02:14   DG C-Arm 1-60 Min-No Report  Result Date: 04/23/2021 Fluoroscopy was utilized by the requesting physician.  No radiographic interpretation.   DG HIP OPERATIVE UNILAT WITH PELVIS RIGHT  Result Date: 04/23/2021 CLINICAL DATA:  History of right hip fracture. EXAM: OPERATIVE RIGHT HIP (WITH PELVIS IF PERFORMED)  VIEWS TECHNIQUE: Fluoroscopic spot image(s) were submitted for interpretation post-operatively. COMPARISON:  Right hip radiographs dated 04/22/2021. FINDINGS: Intraoperative fluoroscopy images demonstrate postoperative appearance of a total right hip arthroplasty. No evidence of hardware failure or malalignment. IMPRESSION: Postoperative appearance of total right hip arthroplasty. Electronically Signed   By: Zerita Boers M.D.   On: 04/23/2021 11:46   DG FEMUR, MIN 2 VIEWS RIGHT  Result Date: 04/22/2021 CLINICAL DATA:  Status post fall. EXAM: RIGHT FEMUR 2 VIEWS COMPARISON:  None. FINDINGS: An acute fracture deformity is seen extending through the neck of the proximal right femur. Mild lateral displacement of the distal fracture site is noted. There is no evidence of dislocation. A 14 mm x 8 mm well-defined, benign-appearing lucency is seen within the medullary region of the proximal right femoral shaft. Soft tissue structures are remarkable for mild to moderate severity vascular calcification. IMPRESSION: Acute fracture of the proximal right femur. Electronically Signed   By: Virgina Norfolk M.D.   On: 04/22/2021 22:17    Procedures Procedures   Medications Ordered in ED Medications  carvedilol (COREG) tablet 6.25 mg (6.25 mg Oral Given 04/23/21 1623)  irbesartan (AVAPRO) tablet 75 mg ( Oral MAR Unhold 04/23/21 1243)  pantoprazole (PROTONIX) EC tablet 40  mg ( Oral MAR Unhold 04/23/21 1243)  ipratropium (ATROVENT) 0.03 % nasal spray 1 spray ( Each Nare MAR Unhold 04/23/21 1243)  morphine 2 MG/ML injection 0.5 mg (  Intravenous MAR Unhold 04/23/21 1243)  HYDROcodone-acetaminophen (NORCO/VICODIN) 5-325 MG per tablet 1-2 tablet ( Oral MAR Unhold 04/23/21 1243)  zolpidem (AMBIEN) tablet 5 mg ( Oral MAR Unhold 04/23/21 1243)  melatonin tablet 5 mg ( Oral MAR Unhold 04/23/21 1243)  senna-docusate (Senokot-S) tablet 1 tablet ( Oral MAR Unhold 04/23/21 1243)  bisacodyl (DULCOLAX) EC tablet 5 mg ( Oral MAR Unhold 04/23/21 1243)  insulin aspart (novoLOG) injection 0-9 Units (2 Units Subcutaneous Given 04/23/21 1628)  sodium chloride 0.9 % with ceFAZolin (ANCEF) ADS Med (has no administration in time range)  docusate sodium (COLACE) capsule 100 mg (has no administration in time range)  ondansetron (ZOFRAN) tablet 4 mg (has no administration in time range)    Or  ondansetron (ZOFRAN) injection 4 mg (has no administration in time range)  metoCLOPramide (REGLAN) tablet 5-10 mg (has no administration in time range)    Or  metoCLOPramide (REGLAN) injection 5-10 mg (has no administration in time range)  menthol-cetylpyridinium (CEPACOL) lozenge 3 mg (has no administration in time range)    Or  phenol (CHLORASEPTIC) mouth spray 1 spray (has no administration in time range)  aspirin chewable tablet 81 mg (has no administration in time range)  lactated ringers infusion ( Intravenous Not Given 04/23/21 1622)  ceFAZolin (ANCEF) 2 g in sodium chloride 0.9 % 100 mL IVPB (2 g Intravenous Given 04/23/21 1651)  methocarbamol (ROBAXIN) tablet 500 mg (has no administration in time range)    Or  methocarbamol (ROBAXIN) 500 mg in dextrose 5 % 50 mL IVPB (has no administration in time range)  acetaminophen (TYLENOL) tablet 325-650 mg (has no administration in time range)  oxyCODONE (Oxy IR/ROXICODONE) immediate release tablet 5-10 mg (has no administration in time range)   HYDROmorphone (DILAUDID) injection 0.5 mg (has no administration in time range)  acetaminophen (TYLENOL) tablet 1,000 mg (1,000 mg Oral Given 04/23/21 1611)  potassium chloride SA (KLOR-CON) CR tablet 20 mEq (20 mEq Oral Given 04/23/21 1650)  0.9 %  sodium chloride infusion (500 mLs Intravenous New Bag/Given 04/23/21 1619)  HYDROcodone-acetaminophen (NORCO/VICODIN) 5-325 MG per tablet 1 tablet (1 tablet Oral Given 04/22/21 2201)  chlorhexidine (HIBICLENS) 4 % liquid 4 application (4 application Topical Given 04/23/21 0457)  ceFAZolin (ANCEF) IVPB 2g/100 mL premix (2 g Intravenous Given 04/23/21 0734)  tranexamic acid (CYKLOKAPRON) IVPB 1,000 mg (1,000 mg Intravenous Given 04/23/21 0816)  tranexamic acid (CYKLOKAPRON) IVPB 1,000 mg (1,000 mg Intravenous New Bag/Given 04/23/21 1622)    ED Course  I have reviewed the triage vital signs and the nursing notes.  Pertinent labs & imaging results that were available during my care of the patient were reviewed by me and considered in my medical decision making (see chart for details).    MDM Rules/Calculators/A&P                           75 year old male presenting to the emergency department after a mechanical ground-level fall earlier this evening.  States overall the patient has been nonambulatory.  He presents with a right lower extremity that is shortened and externally rotated, distally neurovascularly intact.  Concern for hip fracture.  The patient had no head trauma and did not lose consciousness, however we will obtain a CT head and cervical spine given the patient's age greater than 36 in the setting of a ground-level fall.  No other injuries identified on primary or secondary survey.  X-ray imaging of the patient's hip revealed a right femoral  neck fracture for which orthopedics was consulted.  The patient was administered IV pain control.  Hospitalist medicine was consulted for admission for comanagement.  Orthopedics was consulted in the  emergency department with a plan for surgical fixation in the a.m.  The patient was made n.p.o. and subsequently admitted in stable condition.  Final Clinical Impression(s) / ED Diagnoses Final diagnoses:  Fall  Closed fracture of neck of right femur, initial encounter [facility])    Rx / [place] Orders ED Discharge Orders     None        Regan Lemming, MD 04/23/21 1914

## 2021-04-22 NOTE — ED Triage Notes (Signed)
Pt claims pain is 2/10, did not hit his head, had no LOC and is not on any blood thinners.

## 2021-04-22 NOTE — H&P (Signed)
History and Physical    Antonio Serrano [id number] DOB: 1946-06-25 DOA: 04/22/2021  PCP: Tammi Sou, MD  Patient coming from: home  I have personally briefly reviewed patient's old medical records in [facility].  Chief Complaint: right hip pain after fall at home  HPI: Antonio Serrano is a 75 y.o. male with medical history significant for diabetes, hypertension, chronic kidney disease stage 3 (Creatinine 1.2-1.4), who presents to the emergency department on 04/22/2021 with right hip pain and inability to ambulate after a fall at home.  On 04/22/2021, the lights went off at home and it was hot in the house, so he went outside to check on things, and tripped and fell onto his right side onto concrete. He had a large set of keys in his pocket and he thinks that contributed to the pain. When he tried to get up he was unable to move or put any weight on his right leg. Friends and family helped him to a chair. Pain is located in the right hip and does not radiate. It is up to 9/10 at times and is characterized as sharp. Symptoms are alleviated by nothing and exacerbated by trying to bear weight or lift his leg.  Pain has been constantly present but variable in severity since the fall. Associated symptoms: No numbness. No fever or chills.No abdominal pain, vomiting, diarrhea, or bloody stool. He has no history of COPD or heart disease. He has never had stents placed in his heart. He is not on any blood thinners other than aspirin 81 mg. He reports this is the first time he has ever had to stay in a hospital.  ED Course: Patient was found to have a right femoral neck fracture. ED physician contacted orthopedic surgeon, Dr. Marlou Sa, who plans surgery for 04/23/21.  Review of Systems: As per HPI otherwise all other systems reviewed and are unremarable. RESPIRATORY: No cough, wheezing, or shortness of breath.  CARDIOVASCULAR: No chest pain or palpitations.    Past Medical History:  Diagnosis Date    Atherosclerosis of coronary artery    On chest CT->calcif in LAD and RCA   Chronic prostatitis    Very mild elevation of PSA (>4), referred to [facility] where PSA repeat was 1.0 on 06/16/13.  Annual PSA repeat is all that is needed now per urologist.  Most recent 07/2015 was 0.45.   Chronic renal insufficiency, stage III (moderate) (HCC)    Baseline GFR as of 2019= 50s.     Diabetes mellitus with complication ([place]) Dx'd fall 2011   HTN (hypertension)    Renal/aortic doppler u/s normal 06/2010   Hypercalcemia    Hyperlipidemia 2016   Statin intolerant--myalgias.  Pt refuses any further trial of statin as of 2018.   Nonspecific abnormal electrocardiogram (ECG) (EKG) Fall 2011   TWI in inferior leads: myocardial perfusion scan neg and echo normal 07/2010   Tobacco dependence    UTI (lower urinary tract infection)    Feb 2012 (klebsiella--dx at [facility]); 03/2013 e coli.      Past Surgical History:  Procedure Laterality Date   CARDIOVASCULAR STRESS TEST  07/2010   Myocardial perfusion scan neg/low risk.   CATARACT EXTRACTION  2007, 2008   Bilateral ([facility] on [address].   COLONOSCOPY  12/20/04;12/2014   2016 no polyps: recall 5 yrs due to [place] of colon ca   ESOPHAGOGASTRODUODENOSCOPY  11/2004   esoph stricture/dilation   TRANSTHORACIC ECHOCARDIOGRAM  07/2010   EF =>55%; LA mildly dilated; trace  MR/TR;     Social History  reports that he has been smoking cigarettes. He has a 12.50 pack-year smoking history. He has never used smokeless tobacco. He reports previous alcohol use. He reports that he does not use drugs.  Smokes half ppd. Not ready to quit. Occasional alcohol.  Allergies  Allergen Reactions   Bactrim [Sulfamethoxazole-Trimethoprim] Nausea Only    Family History  Problem Relation Age of Onset   Dementia Mother    Pneumonia Father        died in 23's of pneumonia, was otherwise healthy   Cancer Sister        colon cancer.  Died age 50   Diabetes  Paternal Aunt    Diabetes Sister    Colon cancer Neg Hx      Home Medications  Prior to Admission medications   Medication Sig Start Date End Date Taking? Authorizing Provider  aspirin EC 81 MG tablet Take 81 mg by mouth daily.    [provider]  carvedilol (COREG) 6.25 MG tablet TAKE 1 TABLET BY MOUTH TWICE DAILY WITH A MEAL 11/30/20   [name], Adrian Blackwater, MD  esomeprazole (NEXIUM) 20 MG capsule Take 20 mg by mouth daily at 12 noon.    [provider]  furosemide (LASIX) 40 MG tablet TAKE 1 TABLET BY MOUTH ONCE DAILY . APPOINTMENT REQUIRED FOR FUTURE REFILLS 03/17/21   [name], Adrian Blackwater, MD  ipratropium (ATROVENT) 0.03 % nasal spray USE 2 SPRAY(S) IN EACH NOSTRIL EVERY 12 HOURS 01/24/21   [name], Adrian Blackwater, MD  olmesartan (BENICAR) 20 MG tablet Take 1/2 (one-half) tablet by mouth once daily 11/24/20   [name], Adrian Blackwater, MD  pioglitazone (ACTOS) 15 MG tablet TAKE 1/2 TO 1 (ONE-HALF TO ONE) TABLET BY MOUTH ONCE DAILY 02/23/21   Tammi Sou, MD    Physical Exam: Vitals:   04/22/21 2029 04/22/21 2032 04/22/21 2330  BP: (!) 154/83  (!) 155/80  Pulse: (!) 55  75  Resp: 18  15  Temp: 98.6 F (37 C)    TempSrc: Oral    SpO2: 94%  99%  Weight:  74.8 kg   Height:  '5\' 6"'$  (1.676 m)     Constitutional: NAD, calm, comfortable, ill-appearing. Vitals:   04/22/21 2029 04/22/21 2032 04/22/21 2330  BP: (!) 154/83  (!) 155/80  Pulse: (!) 55  75  Resp: 18  15  Temp: 98.6 F (37 C)    TempSrc: Oral    SpO2: 94%  99%  Weight:  74.8 kg   Height:  '5\' 6"'$  (1.676 m)    Eyes: Pupils equal and round, lids and conjunctivae without icterus or erythema. ENMT: Mucous membranes are dry. Posterior pharynx clear of any exudate or lesions. Nares patent without discharge or bleeding.  Normocephalic, atraumatic.  Normal dentition.  Neck: normal, supple, no masses, trachea midline.  Thyroid nontender, no masses appreciated, no thyromegaly. Respiratory: clear to auscultation bilaterally.  Chest wall movements are symmetric. No wheezing, no crackles.  No rhonchi.  Normal respiratory effort. No accessory muscle use.  Cardiovascular: Regular rate and rhythm, no murmurs / rubs / gallops. Pulses: DP pulses 2+ bilaterally. No carotid bruits.  Capillary refill less than 3 seconds. Edema: None bilaterally. GI: soft, non-distended, normal active bowel sounds. No hepatosplenomegaly. No rigidity, rebound, or guarding. Non-tender. No masses palpated.  Musculoskeletal: no clubbing / cyanosis. No joint deformity upper and lower extremities. Good ROM, no contractures. Normal muscle tone.  No tenderness or deformity in the upper  extremities bilaterally. Tenderness to palpation in the right hip area. Patient unable to lift right leg due to pain. Integument: no rashes, lesions, ulcers. No induration. Clean, dry, intact. Neurologic: CN 2-12 grossly intact. Sensation grossly intact to light touch. DTR 2+ bilaterally.  Babinski: Toes downgoing bilaterally.  Strength 5/5 in all except right lower extremity not tested due to fracture.  Intact rapid alternating movements bilaterally.  No pronator drift. Psychiatric: Normal judgment and insight. Alert and oriented x 3. Normal mood.  Normal and appropriate affect. Lymphatic: No cervical lymphadenopathy. No supraclavicular lymphadenopathy.   Labs on Admission: I have personally reviewed the following labs and imaging studies.  CBC: Recent Labs  Lab 04/23/21 0010  WBC 11.2*  HGB 14.7  HCT 42.9  MCV 88.3  PLT [id number]    Basic Metabolic Panel: Recent Labs  Lab 04/23/21 0010  NA 132*  K 3.6  CL 96*  CO2 24  GLUCOSE 143*  BUN 18  CREATININE 1.49*  CALCIUM 9.3    GFR: CrCl cannot be calculated (Patient's most recent lab result is older than the maximum 21 days allowed.).  Liver Function Tests: No results for input(s): AST, ALT, ALKPHOS, BILITOT, PROT, ALBUMIN in the last 168 hours.  Urine analysis:  Radiological Exams on Admission:  Viewed  personally.  CT HEAD WO CONTRAST  Result Date: 04/22/2021 CLINICAL DATA:  Status post fall. EXAM: CT HEAD WITHOUT CONTRAST TECHNIQUE: Contiguous axial images were obtained from the base of the skull through the vertex without intravenous contrast. COMPARISON:  August 09, 2010 FINDINGS: Brain: There is mild cerebral atrophy with widening of the extra-axial spaces and ventricular dilatation. There are areas of decreased attenuation within the white matter tracts of the supratentorial brain, consistent with microvascular disease changes. This is most prominent within the anterior aspect of the right parietal region and left occipital lobe and is increased in severity when compared to the prior exam. A small, chronic right basal ganglia lacunar infarct is seen. Vascular: No hyperdense vessel or unexpected calcification. Skull: Normal. Negative for fracture or focal lesion. Sinuses/Orbits: No acute finding. Other: None. IMPRESSION: 1. Generalized cerebral atrophy and chronic microvascular white matter disease. 2. No acute intracranial abnormality. Electronically Signed   By: Virgina Norfolk M.D.   On: 04/22/2021 22:22   CT CERVICAL SPINE WO CONTRAST  Result Date: 04/22/2021 CLINICAL DATA:  Status post fall. EXAM: CT CERVICAL SPINE WITHOUT CONTRAST TECHNIQUE: Multidetector CT imaging of the cervical spine was performed without intravenous contrast. Multiplanar CT image reconstructions were also generated. COMPARISON:  None. FINDINGS: Alignment: Normal. Skull base and vertebrae: No acute fracture. Chronic changes are seen involving the body and tip of the dens. Soft tissues and spinal canal: No prevertebral fluid or swelling. No visible canal hematoma. Disc levels: Mild to moderate severity multilevel endplate sclerosis and osteophyte formation is seen. This is most prominent at the level of C6-C7. There is marked severity narrowing of the anterior atlantoaxial articulation. Mild multilevel intervertebral disc  space narrowing is noted, most prominent at the level of C4-C5. Bilateral marked severity multilevel facet joint hypertrophy is noted. Upper chest: Negative. Other: None. IMPRESSION: Multilevel degenerative changes without an acute cervical spine fracture. Electronically Signed   By: Virgina Norfolk M.D.   On: 04/22/2021 22:28   DG Pelvis Portable  Result Date: 04/22/2021 CLINICAL DATA:  Status post fall. EXAM: PORTABLE PELVIS 1-2 VIEWS COMPARISON:  None. FINDINGS: An acute fracture deformity is seen extending through the neck of the proximal right femur. Mild lateral  displacement of the distal fracture site is noted. There is no evidence of dislocation. A 14 mm x 8 mm well-defined, benign-appearing lucency is seen within the medullary region of the proximal right femoral shaft. Soft tissue structures are remarkable for mild to moderate severity vascular calcification. IMPRESSION: Acute fracture of the proximal right femur. Electronically Signed   By: Virgina Norfolk M.D.   On: 04/22/2021 22:15   DG FEMUR, MIN 2 VIEWS RIGHT  Result Date: 04/22/2021 CLINICAL DATA:  Status post fall. EXAM: RIGHT FEMUR 2 VIEWS COMPARISON:  None. FINDINGS: An acute fracture deformity is seen extending through the neck of the proximal right femur. Mild lateral displacement of the distal fracture site is noted. There is no evidence of dislocation. A 14 mm x 8 mm well-defined, benign-appearing lucency is seen within the medullary region of the proximal right femoral shaft. Soft tissue structures are remarkable for mild to moderate severity vascular calcification. IMPRESSION: Acute fracture of the proximal right femur. Electronically Signed   By: Virgina Norfolk M.D.   On: 04/22/2021 22:17    EKG: Ordered.  Assessment/Plan Principal Problem:   Pathologic fracture of femoral neck, right, initial encounter ([place]) Active Problems:   Type 2 diabetes mellitus with stage 3a chronic kidney disease, without long-term current  use of insulin (HCC)   HTN (hypertension), benign   Chronic renal insufficiency, stage III (moderate) (HCC)   Tobacco dependence   Principal Problem:    Pathologic fracture of femoral neck, right, initial encounter ([place]) Plan: IV morphine for pain. NPO per surgery protocol. Surgery planned for 04/23/21. Consult orthopedic surgeon.   Active Problems:    Type 2 diabetes mellitus with stage 3a chronic kidney disease, without long-term current use of insulin ([place]) Plan: Hold oral diabetes medications.  Check fingerstick blood sugars q ac and hs.  Sliding scale insulin.      HTN (hypertension), benign Plan: Continue home medications except hold furosemide due to patient needing to be NPO.    Chronic kidney disease, stage 3a (moderate) (HCC) Due to diabetes and hypertension. Is in his baseline range upon admission. Plan: Avoid nephrotoxins. Monitor Creatinine levels. Continue outpatient follow up.    Tobacco dependence Plan: Counseled to quit.      DVT prophylaxis: SCDs. Code Status:   Full Code   Disposition Plan:   Patient is from:  Home  Anticipated DC to:  Home  Anticipated DC date:  04/26/21  Anticipated DC barriers: inability to walk  Consults called:  ED physician called orthopedic surgeon, Dr. Marlou Sa  Admission status:  Inpatient   Severity of Illness: The appropriate patient status for this patient is INPATIENT. Inpatient status is judged to be reasonable and necessary in order to provide the required intensity of service to ensure the patient's safety. The patient's presenting symptoms, physical exam findings, and initial radiographic and laboratory data in the context of their chronic comorbidities is felt to place them at high risk for further clinical deterioration. Furthermore, it is not anticipated that the patient will be medically stable for discharge from the hospital within 2 midnights of admission. The following factors support the patient status of inpatient.    " The patient's presenting symptoms include right hip pain and inability to lift leg or stand.. " The worrisome physical exam findings include tenderness in hip area. " The initial radiographic and laboratory data are worrisome because of right femoral neck fracture. " The chronic co-morbidities include diabetes, CKD 3a, and hypertension..   * I certify that  at the point of admission it is my clinical judgment that the patient will require inpatient hospital care spanning beyond 2 midnights from the point of admission due to high intensity of service, high risk for further deterioration and high frequency of surveillance required.Tacey Ruiz MD [facility]  How to contact the [place] Attending or Consulting provider [place] or covering provider during after hours [place], for this patient?   Check the care team in [facility] and look for a) attending/consulting [facility] provider listed and b) the [facility] team listed Log into [email or web address] and use [facility]'s universal password to access. If you do not have the password, please contact the hospital operator. Locate the [facility] provider you are looking for under [facility] and page to a number that you can be directly reached. If you still have difficulty reaching the provider, please page the [facility] (Director on Call) for the Hospitalists listed on amion for assistance.  04/22/2021, 11:46 PM

## 2021-04-22 NOTE — Progress Notes (Signed)
Verbalized consent for [facility]SE from patient r/t dysfunctional signature pad.

## 2021-04-22 NOTE — ED Triage Notes (Signed)
Pt brought via [facility], he fell in his driveway, c/o right hip pain, appears to be some shortening and rotation.

## 2021-04-23 ENCOUNTER — Encounter (HOSPITAL_COMMUNITY): Payer: Self-pay | Admitting: Internal Medicine

## 2021-04-23 ENCOUNTER — Inpatient Hospital Stay (HOSPITAL_COMMUNITY): Payer: Medicare Other | Admitting: Certified Registered Nurse Anesthetist

## 2021-04-23 ENCOUNTER — Inpatient Hospital Stay (HOSPITAL_COMMUNITY): Payer: Medicare Other

## 2021-04-23 ENCOUNTER — Encounter (HOSPITAL_COMMUNITY)
Admission: EM | Disposition: A | Discharge status: Home Health | Payer: Self-pay | Source: Home / Self Care | Attending: Internal Medicine

## 2021-04-23 DIAGNOSIS — S72001A Fracture of unspecified part of neck of right femur, initial encounter for closed fracture: Secondary | ICD-10-CM | POA: Diagnosis present

## 2021-04-23 HISTORY — PX: TOTAL HIP ARTHROPLASTY: SHX124

## 2021-04-23 LAB — COMPREHENSIVE METABOLIC PANEL
ALT: 11 U/L (ref 0–44)
AST: 18 U/L (ref 15–41)
Albumin: 4.2 g/dL (ref 3.5–5.0)
Alkaline Phosphatase: 87 U/L (ref 38–126)
Anion gap: 12 (ref 5–15)
BUN: 18 mg/dL (ref 8–23)
CO2: 24 mmol/L (ref 22–32)
Calcium: 9.3 mg/dL (ref 8.9–10.3)
Chloride: 96 mmol/L — ABNORMAL LOW (ref 98–111)
Creatinine, Ser: 1.49 mg/dL — ABNORMAL HIGH (ref 0.61–1.24)
GFR, Estimated: 49 mL/min — ABNORMAL LOW (ref >60)
Glucose, Bld: 143 mg/dL — ABNORMAL HIGH (ref 70–99)
Potassium: 3.6 mmol/L (ref 3.5–5.1)
Sodium: 132 mmol/L — ABNORMAL LOW (ref 135–145)
Total Bilirubin: 0.7 mg/dL (ref 0.3–1.2)
Total Protein: 7.1 g/dL (ref 6.5–8.1)

## 2021-04-23 LAB — BASIC METABOLIC PANEL
Anion gap: 12 (ref 5–15)
BUN: 17 mg/dL (ref 8–23)
CO2: 24 mmol/L (ref 22–32)
Calcium: 9.3 mg/dL (ref 8.9–10.3)
Chloride: 97 mmol/L — ABNORMAL LOW (ref 98–111)
Creatinine, Ser: 1.27 mg/dL — ABNORMAL HIGH (ref 0.61–1.24)
GFR, Estimated: 60 mL/min — ABNORMAL LOW (ref >60)
Glucose, Bld: 125 mg/dL — ABNORMAL HIGH (ref 70–99)
Potassium: 3.4 mmol/L — ABNORMAL LOW (ref 3.5–5.1)
Sodium: 133 mmol/L — ABNORMAL LOW (ref 135–145)

## 2021-04-23 LAB — CBC
HCT: 41.8 % (ref 39.0–52.0)
HCT: 42.9 % (ref 39.0–52.0)
Hemoglobin: 14.3 g/dL (ref 13.0–17.0)
Hemoglobin: 14.7 g/dL (ref 13.0–17.0)
MCH: 30.2 pg (ref 26.0–34.0)
MCH: 30.4 pg (ref 26.0–34.0)
MCHC: 34.2 g/dL (ref 30.0–36.0)
MCHC: 34.3 g/dL (ref 30.0–36.0)
MCV: 88.3 fL (ref 80.0–100.0)
MCV: 88.7 fL (ref 80.0–100.0)
Platelets: 209 10*3/uL (ref 150–400)
Platelets: 234 10*3/uL (ref 150–400)
RBC: 4.71 MIL/uL (ref 4.22–5.81)
RBC: 4.86 MIL/uL (ref 4.22–5.81)
RDW: 14.4 % (ref 11.5–15.5)
RDW: 14.4 % (ref 11.5–15.5)
WBC: 11.2 10*3/uL — ABNORMAL HIGH (ref 4.0–10.5)
WBC: 8.5 10*3/uL (ref 4.0–10.5)
nRBC: 0 % (ref 0.0–0.2)
nRBC: 0 % (ref 0.0–0.2)

## 2021-04-23 LAB — PROTIME-INR
INR: 1.1 (ref 0.8–1.2)
Prothrombin Time: 13.9 seconds (ref 11.4–15.2)

## 2021-04-23 LAB — TYPE AND SCREEN
ABO/RH(D): O POS
Antibody Screen: NEGATIVE

## 2021-04-23 LAB — GLUCOSE, CAPILLARY
Glucose-Capillary: 135 mg/dL — ABNORMAL HIGH (ref 70–99)
Glucose-Capillary: 166 mg/dL — ABNORMAL HIGH (ref 70–99)
Glucose-Capillary: 140 mg/dL — ABNORMAL HIGH (ref 70–99)

## 2021-04-23 LAB — RESP PANEL BY RT-PCR (FLU A&B, COVID) ARPGX2
Influenza A by PCR: NEGATIVE
Influenza B by PCR: NEGATIVE
SARS Coronavirus 2 by RT PCR: NEGATIVE

## 2021-04-23 LAB — ABO/RH: ABO/RH(D): O POS

## 2021-04-23 LAB — APTT: aPTT: 38 seconds — ABNORMAL HIGH (ref 24–36)

## 2021-04-23 IMAGING — DX DG CHEST 1V PORT
2 series · 2 of 2 positions shown · non-contrast
Comparison: None.

CLINICAL DATA: Preoperative chest x-ray for right hip fracture

EXAM:
PORTABLE CHEST 1 VIEW

[chest ap (1 of 2)]
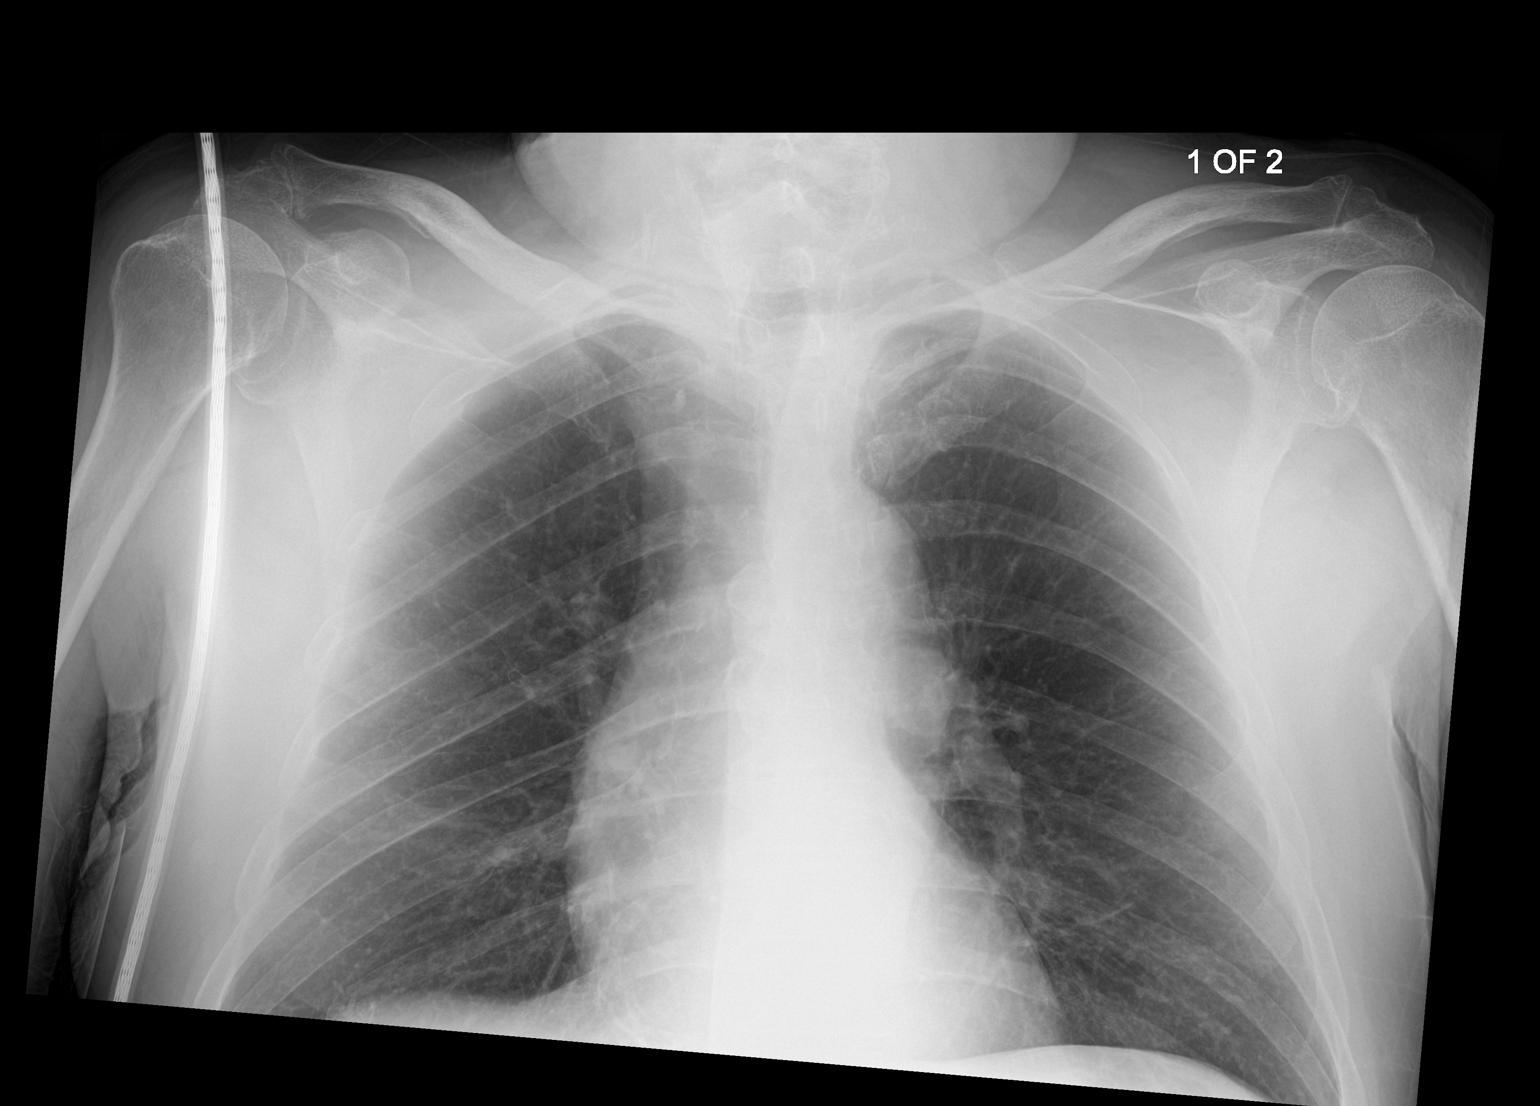

[chest ap (2 of 2)]
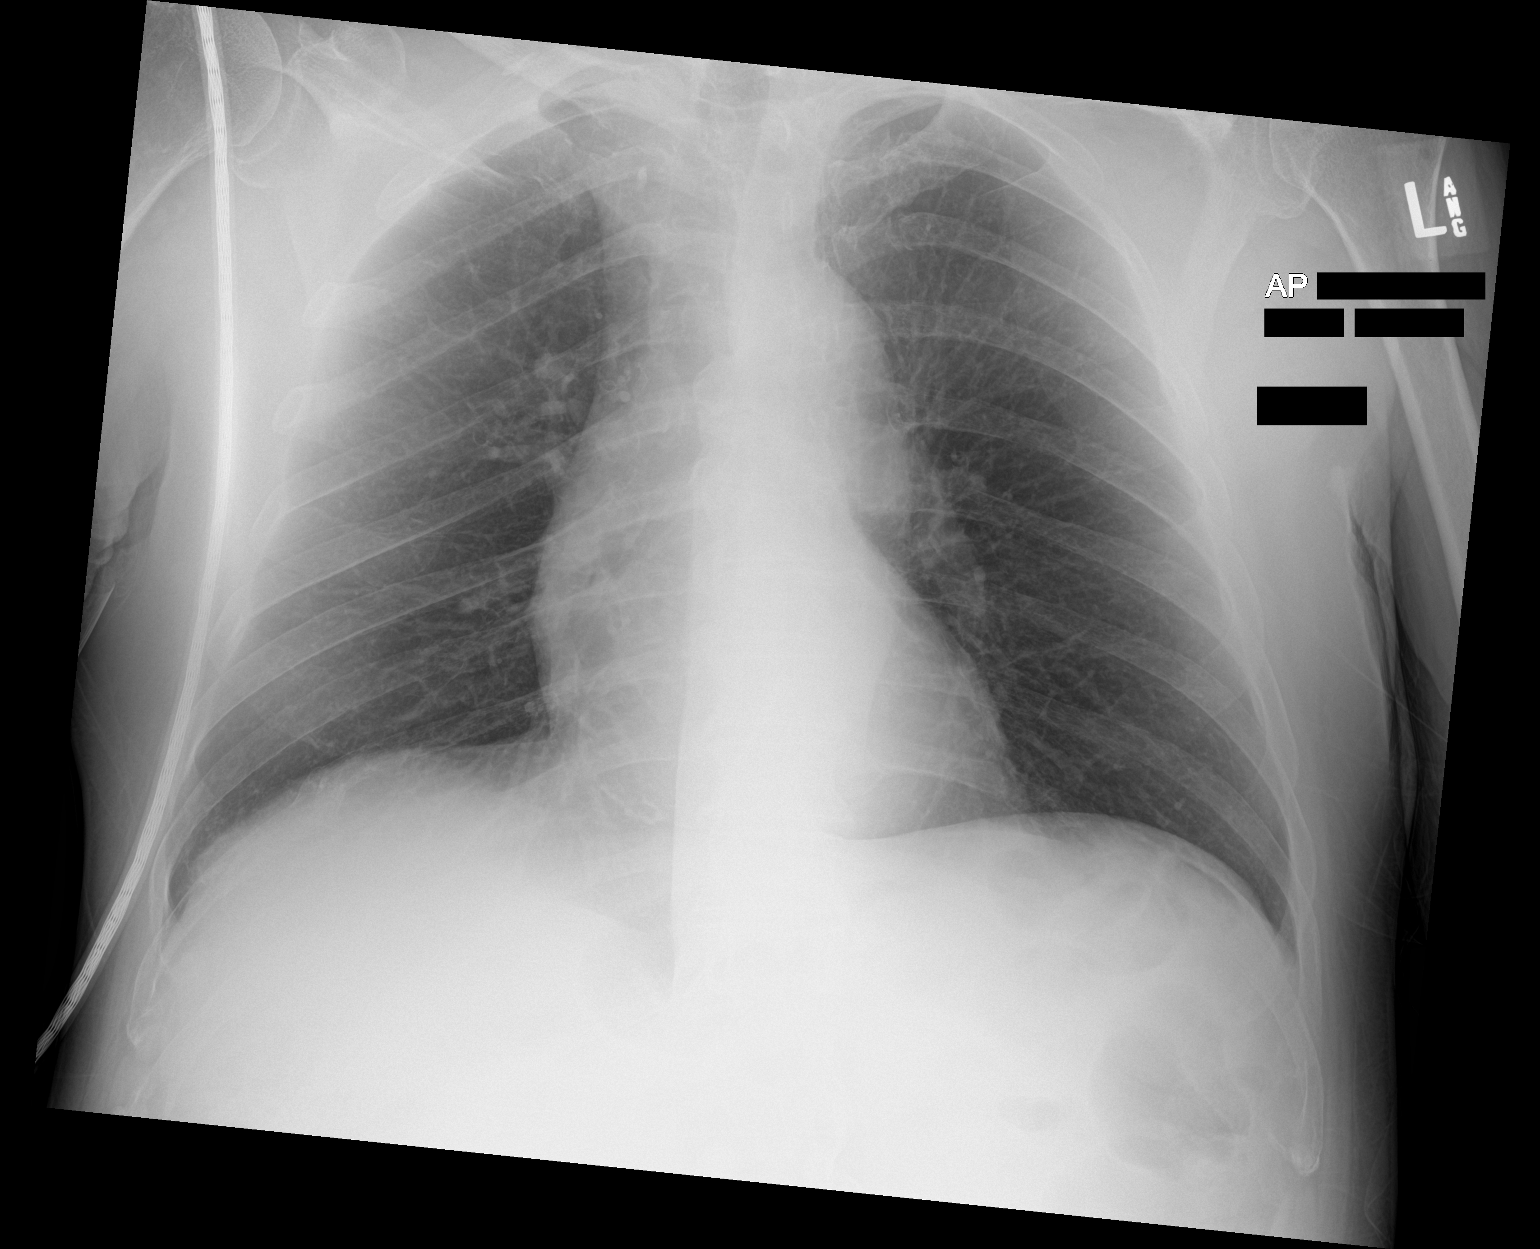

[2 of 2 positions shown; findings below may reference images not displayed]

FINDINGS: No consolidation, features of edema, pneumothorax, or effusion.
Pulmonary vascularity is normally distributed. The cardiomediastinal
contours are unremarkable. No acute osseous or soft tissue
abnormality.
IMPRESSION: No acute cardiopulmonary abnormality.

## 2021-04-23 IMAGING — RF DG HIP (WITH PELVIS) OPERATIVE*R*
1 series · 3 of 3 positions shown · non-contrast
Comparison: Right hip radiographs dated [DATE].

CLINICAL DATA: History of right hip fracture.

EXAM:
OPERATIVE RIGHT HIP (WITH PELVIS IF PERFORMED)  VIEWS
TECHNIQUE: Fluoroscopic spot image(s) were submitted for interpretation
post-operatively.

[Series 1: unknown protocol · 0.20mm/px · 3 of 3 slices shown]
[im 1/3]
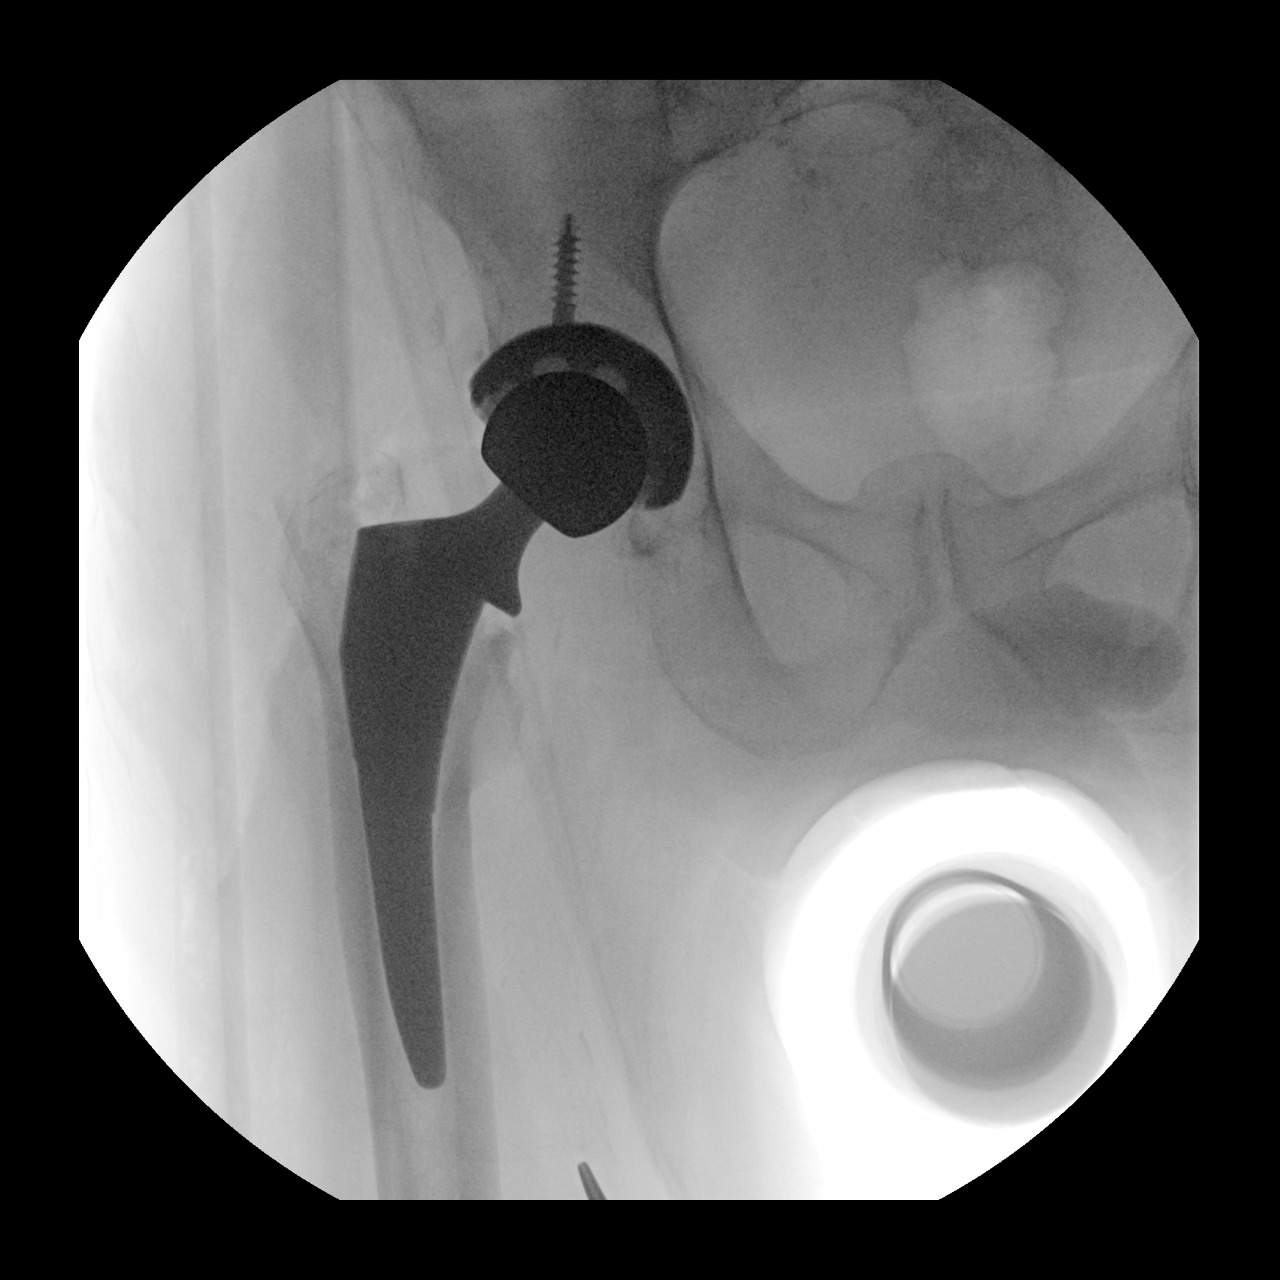
[im 2/3]
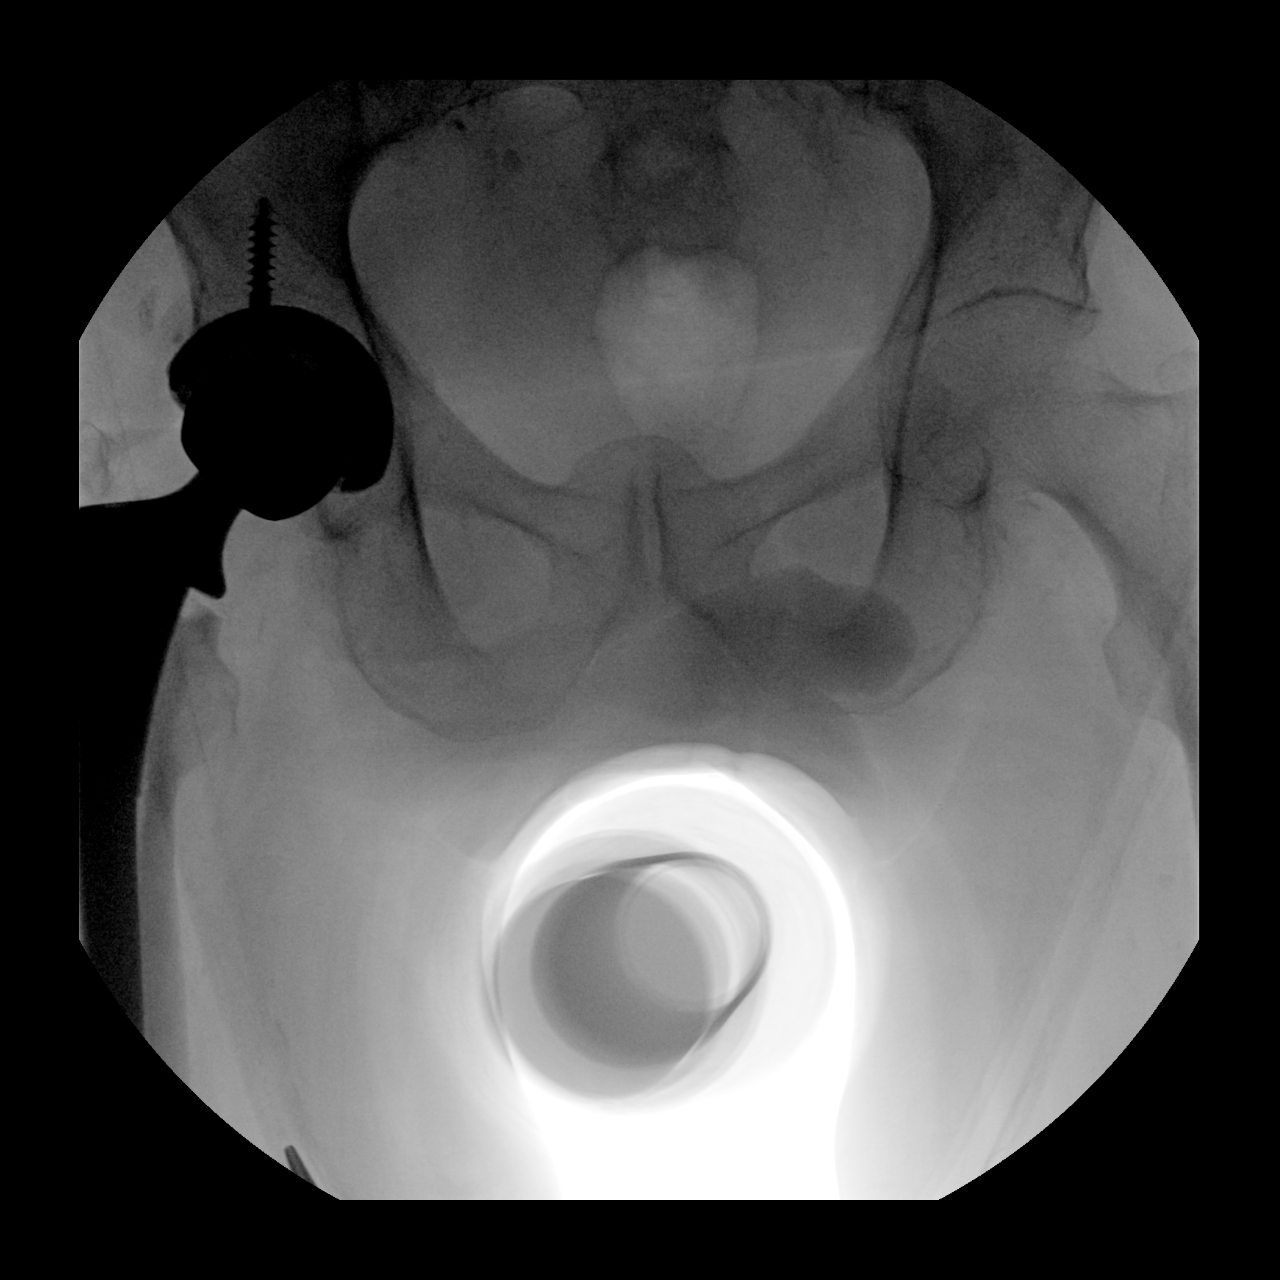
[im 3/3]
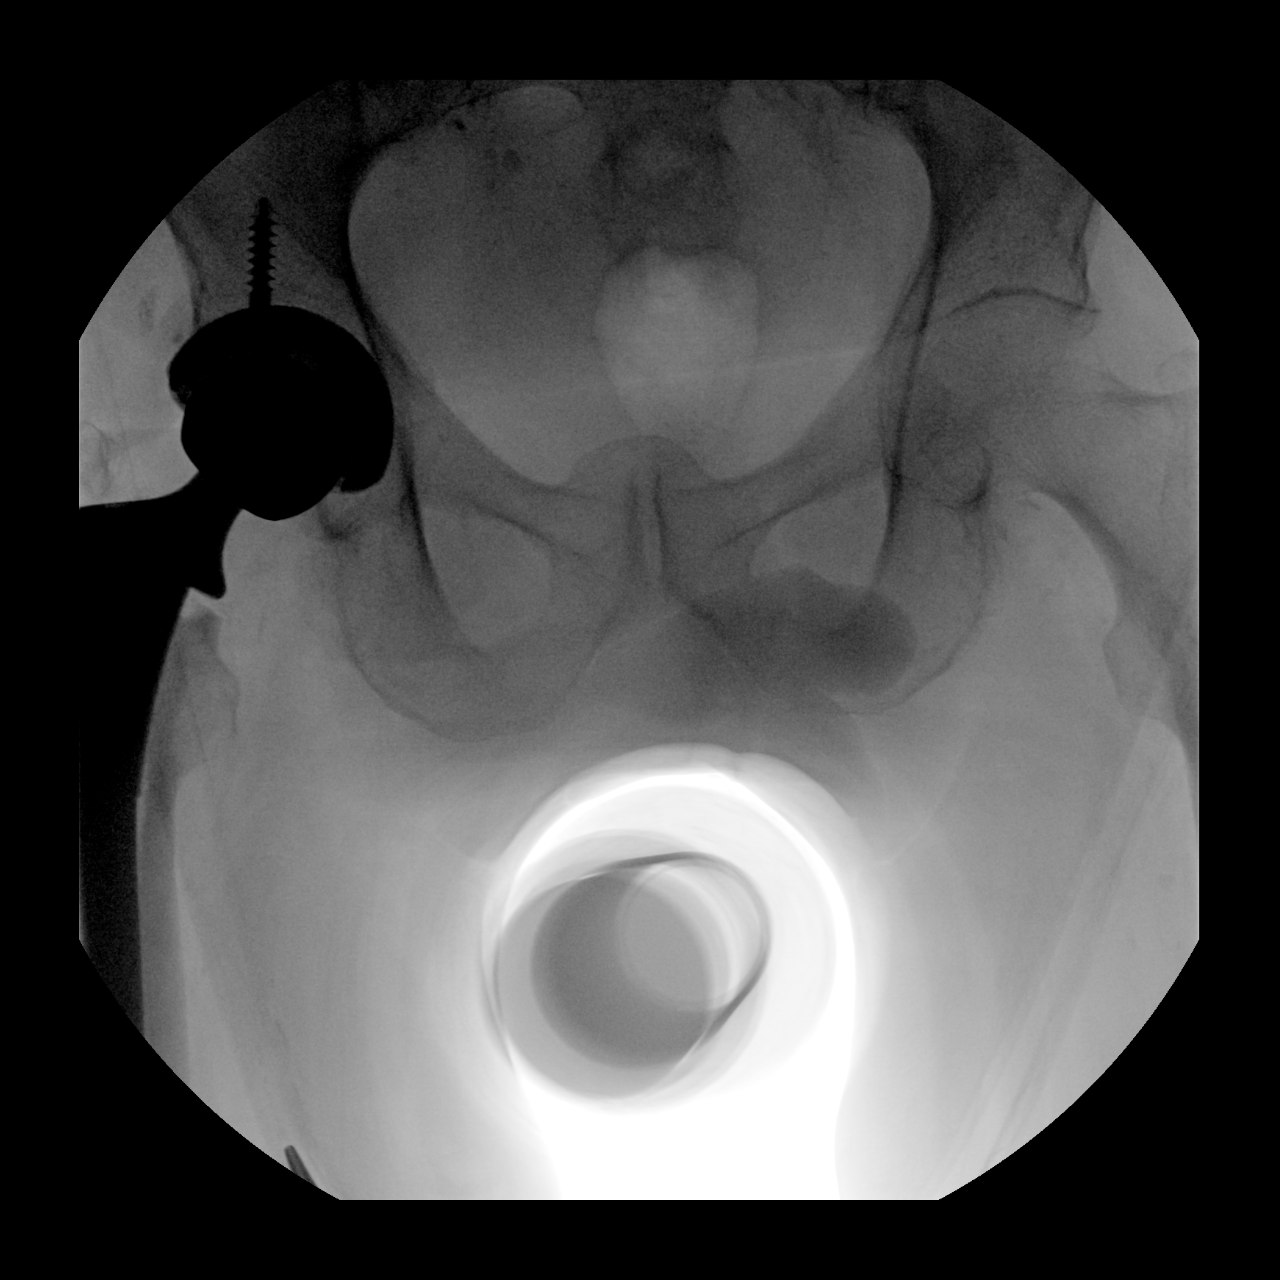

[3 of 3 positions shown; findings below may reference images not displayed]

FINDINGS: Intraoperative fluoroscopy images demonstrate postoperative
appearance of a total right hip arthroplasty. No evidence of
hardware failure or malalignment.
IMPRESSION: Postoperative appearance of total right hip arthroplasty.

## 2021-04-23 IMAGING — DX DG PORTABLE PELVIS
1 series · 1 of 1 positions shown · non-contrast
Comparison: Right hip radiographs dated [DATE].

CLINICAL DATA: History of right femoral neck fracture.

EXAM:
PORTABLE PELVIS 1-2 VIEWS

[pelvis ap]
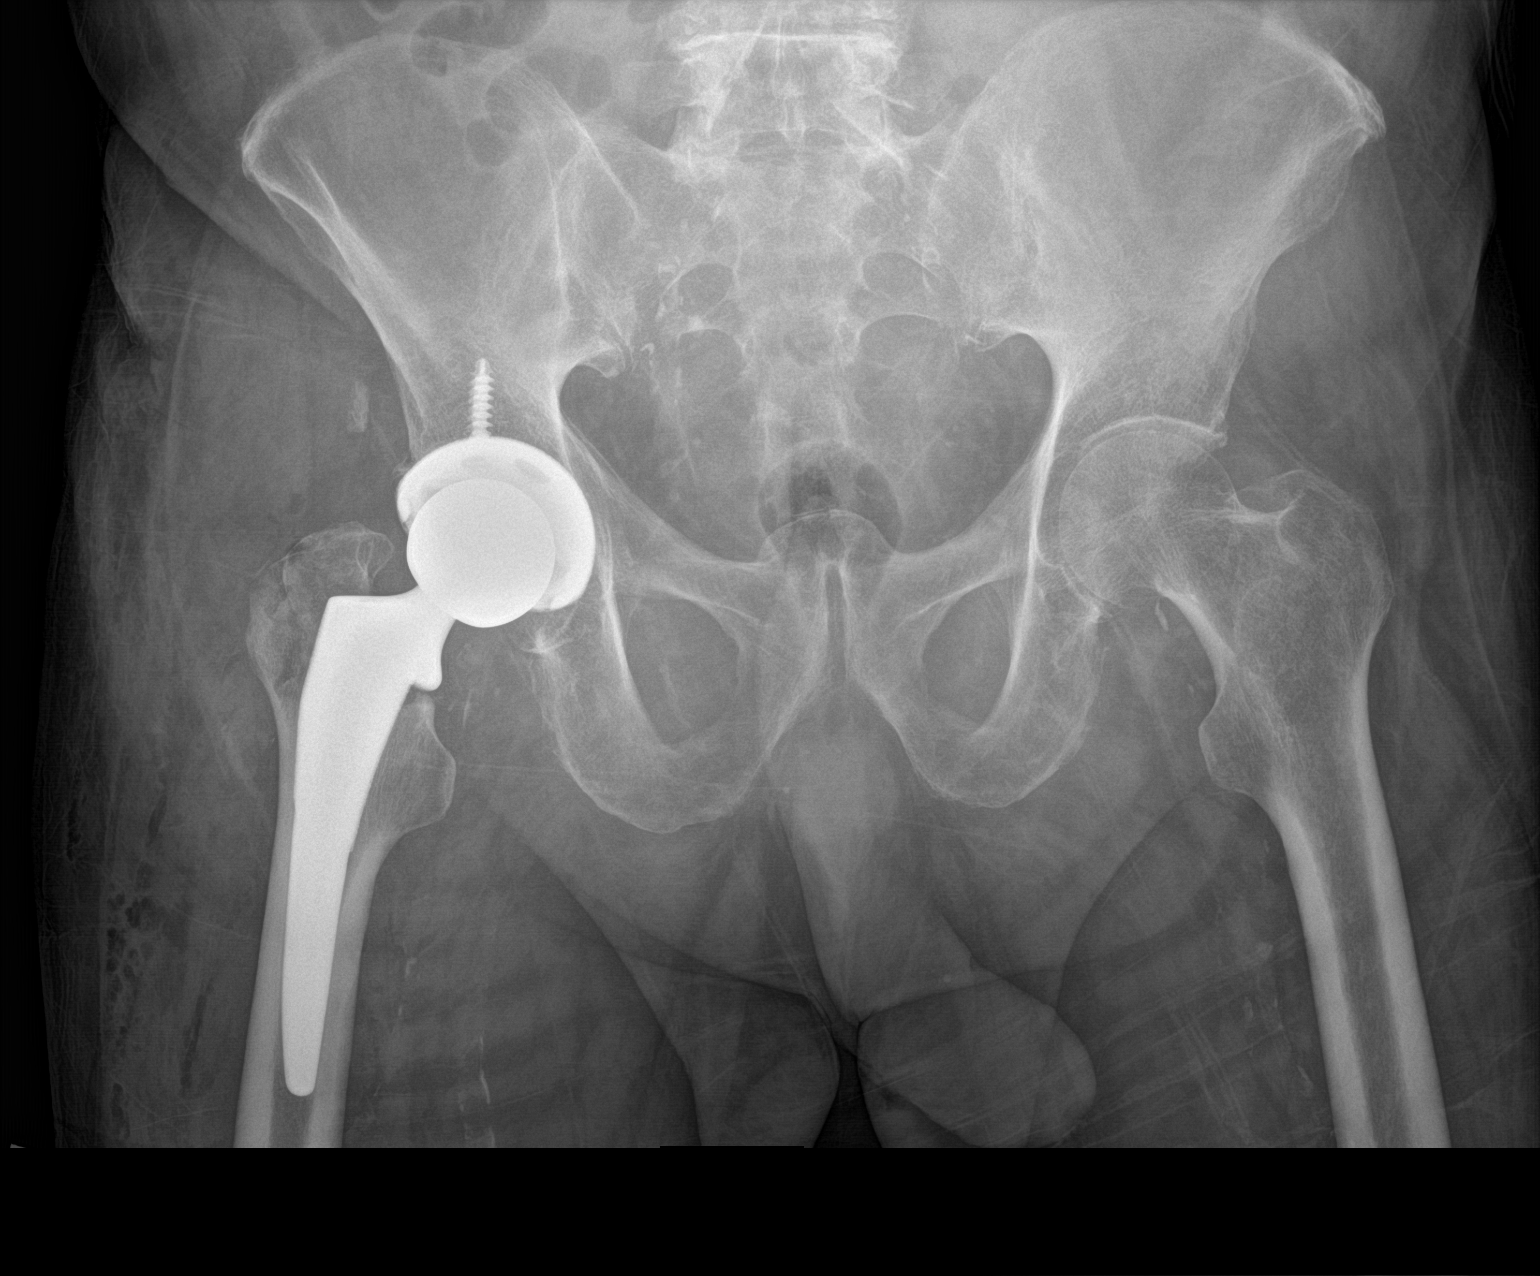

[1 of 1 positions shown; findings below may reference images not displayed]

FINDINGS: Intraoperative fluoroscopy images demonstrate postoperative
appearance of a total right hip arthroplasty. No evidence of
hardware failure or malalignment.
IMPRESSION: Postoperative appearance of total right hip arthroplasty.

## 2021-04-23 SURGERY — ARTHROPLASTY, HIP, TOTAL, ANTERIOR APPROACH
Anesthesia: Spinal | Site: Hip | Laterality: Right

## 2021-04-23 MED ORDER — VANCOMYCIN HCL 1000 MG IV SOLR
INTRAVENOUS | Status: DC | PRN
  Administered 2021-04-23: 1000 mg via TOPICAL

## 2021-04-23 MED ORDER — BUPIVACAINE LIPOSOME 1.3 % IJ SUSP
20.0000 mL | Freq: Once | INTRAMUSCULAR | Status: DC
Start: 2021-04-23 — End: 2021-04-23
  Filled 2021-04-23: qty 20

## 2021-04-23 MED ORDER — ONDANSETRON HCL 4 MG/2ML IJ SOLN: 4.0000 mg | Freq: Four times a day (QID) | INTRAMUSCULAR | Status: DC | PRN

## 2021-04-23 MED ORDER — MORPHINE SULFATE (PF) 2 MG/ML IV SOLN
0.5000 mg | INTRAVENOUS | Status: DC | PRN
  Administered 2021-04-23 (×2): 0.5 mg via INTRAVENOUS
  Filled 2021-04-23 (×2): qty 1

## 2021-04-23 MED ORDER — GLYCOPYRROLATE PF 0.2 MG/ML IJ SOSY
PREFILLED_SYRINGE | INTRAMUSCULAR | Status: DC | PRN
  Administered 2021-04-23: .2 mg via INTRAVENOUS

## 2021-04-23 MED ORDER — PHENYLEPHRINE 40 MCG/ML (10ML) SYRINGE FOR IV PUSH (FOR BLOOD PRESSURE SUPPORT)
PREFILLED_SYRINGE | INTRAVENOUS | Status: AC
  Filled 2021-04-23: qty 20

## 2021-04-23 MED ORDER — TRANEXAMIC ACID-NACL 1000-0.7 MG/100ML-% IV SOLN
1000.0000 mg | Freq: Once | INTRAVENOUS | Status: AC
  Administered 2021-04-23: 1000 mg via INTRAVENOUS
  Filled 2021-04-23: qty 100

## 2021-04-23 MED ORDER — LACTATED RINGERS IV SOLN: INTRAVENOUS | Status: DC | PRN

## 2021-04-23 MED ORDER — ONDANSETRON HCL 4 MG/2ML IJ SOLN
INTRAMUSCULAR | Status: DC | PRN
  Administered 2021-04-23: 4 mg via INTRAVENOUS

## 2021-04-23 MED ORDER — ACETAMINOPHEN 325 MG PO TABS
325.0000 mg | ORAL_TABLET | Freq: Four times a day (QID) | ORAL | Status: DC | PRN
  Administered 2021-04-24 – 2021-04-25 (×2): 650 mg via ORAL
  Filled 2021-04-23 (×2): qty 2

## 2021-04-23 MED ORDER — ONDANSETRON HCL 4 MG PO TABS: 4.0000 mg | ORAL_TABLET | Freq: Four times a day (QID) | ORAL | Status: DC | PRN

## 2021-04-23 MED ORDER — ACETAMINOPHEN 325 MG PO TABS: 325.0000 mg | ORAL_TABLET | ORAL | Status: DC | PRN

## 2021-04-23 MED ORDER — HYDROMORPHONE HCL 1 MG/ML IJ SOLN: 0.5000 mg | INTRAMUSCULAR | Status: DC | PRN

## 2021-04-23 MED ORDER — PHENOL 1.4 % MT LIQD: 1.0000 | OROMUCOSAL | Status: DC | PRN

## 2021-04-23 MED ORDER — LIDOCAINE 2% (20 MG/ML) 5 ML SYRINGE
INTRAMUSCULAR | Status: AC
  Filled 2021-04-23: qty 10

## 2021-04-23 MED ORDER — FENTANYL CITRATE (PF) 100 MCG/2ML IJ SOLN
INTRAMUSCULAR | Status: DC | PRN
  Administered 2021-04-23: 25 ug via INTRAVENOUS
  Administered 2021-04-23: 50 ug via INTRAVENOUS

## 2021-04-23 MED ORDER — IRBESARTAN 75 MG PO TABS
75.0000 mg | ORAL_TABLET | Freq: Every day | ORAL | Status: DC
  Administered 2021-04-24 – 2021-04-25 (×2): 75 mg via ORAL
  Filled 2021-04-23 (×3): qty 1

## 2021-04-23 MED ORDER — ONDANSETRON HCL 4 MG/2ML IJ SOLN: 4.0000 mg | Freq: Once | INTRAMUSCULAR | Status: DC | PRN

## 2021-04-23 MED ORDER — TRANEXAMIC ACID-NACL 1000-0.7 MG/100ML-% IV SOLN
1000.0000 mg | INTRAVENOUS | Status: AC
  Administered 2021-04-23: 1000 mg via INTRAVENOUS
  Filled 2021-04-23: qty 100

## 2021-04-23 MED ORDER — DOCUSATE SODIUM 100 MG PO CAPS
100.0000 mg | ORAL_CAPSULE | Freq: Two times a day (BID) | ORAL | Status: DC
  Administered 2021-04-23: 100 mg via ORAL
  Filled 2021-04-23 (×3): qty 1

## 2021-04-23 MED ORDER — BUPIVACAINE HCL (PF) 0.5 % IJ SOLN
INTRAMUSCULAR | Status: AC
  Filled 2021-04-23: qty 30

## 2021-04-23 MED ORDER — ASPIRIN 81 MG PO CHEW
81.0000 mg | CHEWABLE_TABLET | Freq: Two times a day (BID) | ORAL | Status: DC
  Administered 2021-04-23 – 2021-04-25 (×4): 81 mg via ORAL
  Filled 2021-04-23 (×4): qty 1

## 2021-04-23 MED ORDER — VANCOMYCIN HCL 1000 MG IV SOLR
INTRAVENOUS | Status: AC
  Filled 2021-04-23: qty 1000

## 2021-04-23 MED ORDER — SODIUM CHLORIDE 0.9 % IV SOLN
INTRAVENOUS | Status: AC
  Filled 2021-04-23: qty 2

## 2021-04-23 MED ORDER — LIDOCAINE 2% (20 MG/ML) 5 ML SYRINGE
INTRAMUSCULAR | Status: DC | PRN
  Administered 2021-04-23: 60 mg via INTRAVENOUS

## 2021-04-23 MED ORDER — MEPERIDINE HCL 50 MG/ML IJ SOLN: 6.2500 mg | INTRAMUSCULAR | Status: DC | PRN

## 2021-04-23 MED ORDER — SODIUM CHLORIDE (PF) 0.9 % IJ SOLN
INTRAMUSCULAR | Status: DC | PRN
  Administered 2021-04-23: 20 mL

## 2021-04-23 MED ORDER — SODIUM CHLORIDE (PF) 0.9 % IJ SOLN
INTRAMUSCULAR | Status: AC
  Filled 2021-04-23: qty 20

## 2021-04-23 MED ORDER — LACTATED RINGERS IV SOLN: INTRAVENOUS | Status: DC

## 2021-04-23 MED ORDER — MENTHOL 3 MG MT LOZG: 1.0000 | LOZENGE | OROMUCOSAL | Status: DC | PRN

## 2021-04-23 MED ORDER — METOCLOPRAMIDE HCL 5 MG/ML IJ SOLN: 5.0000 mg | Freq: Three times a day (TID) | INTRAMUSCULAR | Status: DC | PRN

## 2021-04-23 MED ORDER — IPRATROPIUM BROMIDE 0.03 % NA SOLN
1.0000 | Freq: Two times a day (BID) | NASAL | Status: DC
  Administered 2021-04-24 (×2): 1 via NASAL
  Filled 2021-04-23: qty 30

## 2021-04-23 MED ORDER — PHENYLEPHRINE HCL-NACL 10-0.9 MG/250ML-% IV SOLN
INTRAVENOUS | Status: DC | PRN
  Administered 2021-04-23: 100 ug/min via INTRAVENOUS

## 2021-04-23 MED ORDER — METHOCARBAMOL 1000 MG/10ML IJ SOLN
500.0000 mg | Freq: Four times a day (QID) | INTRAVENOUS | Status: DC | PRN
  Filled 2021-04-23: qty 5

## 2021-04-23 MED ORDER — CARVEDILOL 6.25 MG PO TABS
6.2500 mg | ORAL_TABLET | Freq: Two times a day (BID) | ORAL | Status: DC
  Administered 2021-04-23 – 2021-04-25 (×4): 6.25 mg via ORAL
  Filled 2021-04-23 (×5): qty 1

## 2021-04-23 MED ORDER — PHENYLEPHRINE HCL (PRESSORS) 10 MG/ML IV SOLN
INTRAVENOUS | Status: AC
  Filled 2021-04-23: qty 1

## 2021-04-23 MED ORDER — MEPIVACAINE HCL (PF) 2 % IJ SOLN
INTRAMUSCULAR | Status: AC
  Filled 2021-04-23: qty 20

## 2021-04-23 MED ORDER — ISOPROPYL ALCOHOL 70 % SOLN
Status: AC
  Filled 2021-04-23: qty 480

## 2021-04-23 MED ORDER — POVIDONE-IODINE 10 % EX SWAB: 2.0000 "application " | Freq: Once | CUTANEOUS | Status: DC

## 2021-04-23 MED ORDER — OXYCODONE HCL 5 MG PO TABS
5.0000 mg | ORAL_TABLET | ORAL | Status: DC | PRN
  Administered 2021-04-24: 5 mg via ORAL
  Filled 2021-04-23: qty 1

## 2021-04-23 MED ORDER — SODIUM CHLORIDE 0.9 % IV SOLN
2.0000 g | Freq: Three times a day (TID) | INTRAVENOUS | Status: AC
  Administered 2021-04-23 (×2): 2 g via INTRAVENOUS
  Filled 2021-04-23 (×2): qty 2

## 2021-04-23 MED ORDER — SENNOSIDES-DOCUSATE SODIUM 8.6-50 MG PO TABS: 1.0000 | ORAL_TABLET | Freq: Every evening | ORAL | Status: DC | PRN

## 2021-04-23 MED ORDER — CHLORHEXIDINE GLUCONATE 4 % EX LIQD
60.0000 mL | Freq: Once | CUTANEOUS | Status: AC
  Administered 2021-04-23: 4 via TOPICAL

## 2021-04-23 MED ORDER — PANTOPRAZOLE SODIUM 40 MG PO TBEC
40.0000 mg | DELAYED_RELEASE_TABLET | Freq: Every day | ORAL | Status: DC
  Administered 2021-04-24 – 2021-04-25 (×2): 40 mg via ORAL
  Filled 2021-04-23 (×2): qty 1

## 2021-04-23 MED ORDER — DEXAMETHASONE SODIUM PHOSPHATE 10 MG/ML IJ SOLN
INTRAMUSCULAR | Status: DC | PRN
  Administered 2021-04-23: 10 mg via INTRAVENOUS

## 2021-04-23 MED ORDER — ACETAMINOPHEN 160 MG/5ML PO SOLN: 325.0000 mg | ORAL | Status: DC | PRN

## 2021-04-23 MED ORDER — PHENYLEPHRINE 40 MCG/ML (10ML) SYRINGE FOR IV PUSH (FOR BLOOD PRESSURE SUPPORT)
PREFILLED_SYRINGE | INTRAVENOUS | Status: AC
  Filled 2021-04-23: qty 10

## 2021-04-23 MED ORDER — INSULIN ASPART 100 UNIT/ML IJ SOLN
0.0000 [IU] | Freq: Three times a day (TID) | INTRAMUSCULAR | Status: DC
  Administered 2021-04-23: 2 [IU] via SUBCUTANEOUS
  Administered 2021-04-24: 1 [IU] via SUBCUTANEOUS
  Administered 2021-04-24: 2 [IU] via SUBCUTANEOUS
  Filled 2021-04-23: qty 0.09

## 2021-04-23 MED ORDER — ZOLPIDEM TARTRATE 5 MG PO TABS: 5.0000 mg | ORAL_TABLET | Freq: Every evening | ORAL | Status: DC | PRN

## 2021-04-23 MED ORDER — ACETAMINOPHEN 500 MG PO TABS
1000.0000 mg | ORAL_TABLET | Freq: Four times a day (QID) | ORAL | Status: AC
  Administered 2021-04-23 – 2021-04-24 (×3): 1000 mg via ORAL
  Filled 2021-04-23 (×3): qty 2

## 2021-04-23 MED ORDER — METHOCARBAMOL 500 MG PO TABS: 500.0000 mg | ORAL_TABLET | Freq: Four times a day (QID) | ORAL | Status: DC | PRN

## 2021-04-23 MED ORDER — FENTANYL CITRATE (PF) 100 MCG/2ML IJ SOLN: 25.0000 ug | INTRAMUSCULAR | Status: DC | PRN

## 2021-04-23 MED ORDER — MELATONIN 5 MG PO TABS
5.0000 mg | ORAL_TABLET | Freq: Every day | ORAL | Status: DC
  Administered 2021-04-23 – 2021-04-24 (×2): 5 mg via ORAL
  Filled 2021-04-23 (×2): qty 1

## 2021-04-23 MED ORDER — BISACODYL 5 MG PO TBEC: 5.0000 mg | DELAYED_RELEASE_TABLET | Freq: Every day | ORAL | Status: DC | PRN

## 2021-04-23 MED ORDER — PROPOFOL 500 MG/50ML IV EMUL
INTRAVENOUS | Status: DC | PRN
  Administered 2021-04-23: 100 ug/kg/min via INTRAVENOUS
  Administered 2021-04-23: 30 mg via INTRAVENOUS

## 2021-04-23 MED ORDER — BUPIVACAINE LIPOSOME 1.3 % IJ SUSP
INTRAMUSCULAR | Status: DC | PRN
  Administered 2021-04-23: 20 mL

## 2021-04-23 MED ORDER — SODIUM CHLORIDE 0.9 % IV SOLN
INTRAVENOUS | Status: DC | PRN
  Administered 2021-04-23: 500 mL via INTRAVENOUS

## 2021-04-23 MED ORDER — METOCLOPRAMIDE HCL 5 MG PO TABS
5.0000 mg | ORAL_TABLET | Freq: Three times a day (TID) | ORAL | Status: DC | PRN
  Administered 2021-04-24: 5 mg via ORAL
  Filled 2021-04-23: qty 1

## 2021-04-23 MED ORDER — OXYCODONE HCL 5 MG/5ML PO SOLN: 5.0000 mg | Freq: Once | ORAL | Status: DC | PRN

## 2021-04-23 MED ORDER — PHENYLEPHRINE 40 MCG/ML (10ML) SYRINGE FOR IV PUSH (FOR BLOOD PRESSURE SUPPORT)
PREFILLED_SYRINGE | INTRAVENOUS | Status: DC | PRN
  Administered 2021-04-23 (×4): 80 ug via INTRAVENOUS

## 2021-04-23 MED ORDER — PROPOFOL 1000 MG/100ML IV EMUL
INTRAVENOUS | Status: AC
  Filled 2021-04-23: qty 100

## 2021-04-23 MED ORDER — FENTANYL CITRATE (PF) 100 MCG/2ML IJ SOLN
INTRAMUSCULAR | Status: AC
  Filled 2021-04-23: qty 2

## 2021-04-23 MED ORDER — CEFAZOLIN SODIUM-DEXTROSE 2-4 GM/100ML-% IV SOLN
2.0000 g | INTRAVENOUS | Status: AC
  Administered 2021-04-23: 2 g via INTRAVENOUS

## 2021-04-23 MED ORDER — SODIUM CHLORIDE 0.9 % IR SOLN
Status: DC | PRN
  Administered 2021-04-23: 1000 mL
  Administered 2021-04-23: 2000 mL

## 2021-04-23 MED ORDER — BUPIVACAINE HCL (PF) 0.5 % IJ SOLN
INTRAMUSCULAR | Status: DC | PRN
  Administered 2021-04-23: 3 mL via INTRATHECAL

## 2021-04-23 MED ORDER — OXYCODONE HCL 5 MG PO TABS: 5.0000 mg | ORAL_TABLET | Freq: Once | ORAL | Status: DC | PRN

## 2021-04-23 MED ORDER — EPHEDRINE 5 MG/ML INJ
INTRAVENOUS | Status: AC
  Filled 2021-04-23: qty 5

## 2021-04-23 MED ORDER — ONDANSETRON HCL 4 MG/2ML IJ SOLN
INTRAMUSCULAR | Status: AC
  Filled 2021-04-23: qty 4

## 2021-04-23 MED ORDER — HYDROCODONE-ACETAMINOPHEN 5-325 MG PO TABS
1.0000 | ORAL_TABLET | ORAL | Status: DC | PRN
  Administered 2021-04-23: 1 via ORAL
  Administered 2021-04-24: 2 via ORAL
  Filled 2021-04-23 (×2): qty 2
  Filled 2021-04-23: qty 1

## 2021-04-23 MED ORDER — POTASSIUM CHLORIDE CRYS ER 20 MEQ PO TBCR
20.0000 meq | EXTENDED_RELEASE_TABLET | Freq: Two times a day (BID) | ORAL | Status: AC
  Administered 2021-04-23 – 2021-04-24 (×4): 20 meq via ORAL
  Filled 2021-04-23 (×4): qty 1

## 2021-04-23 MED ORDER — DEXAMETHASONE SODIUM PHOSPHATE 10 MG/ML IJ SOLN
INTRAMUSCULAR | Status: AC
  Filled 2021-04-23: qty 2

## 2021-04-23 SURGICAL SUPPLY — 48 items
BAG COUNTER SPONGE SURGICOUNT (BAG) IMPLANT
BAG DECANTER FOR FLEXI CONT (MISCELLANEOUS) ×2 IMPLANT
BAG ZIPLOCK 12X15 (MISCELLANEOUS) IMPLANT
BENZOIN TINCTURE PRP APPL 2/3 (GAUZE/BANDAGES/DRESSINGS) IMPLANT
BLADE SAW SGTL 18X1.27X75 (BLADE) ×2 IMPLANT
BLADE SURG SZ10 CARB STEEL (BLADE) ×4 IMPLANT
CELLS DAT CNTRL 66122 CELL SVR (MISCELLANEOUS) ×1 IMPLANT
COVER PERINEAL POST (MISCELLANEOUS) ×2 IMPLANT
COVER SURGICAL LIGHT HANDLE (MISCELLANEOUS) ×2 IMPLANT
DRAPE C-ARM 42X120 X-RAY (DRAPES) ×2 IMPLANT
DRAPE FOOT SWITCH (DRAPES) ×2 IMPLANT
DRAPE POUCH INSTRU U-SHP 10X18 (DRAPES) ×2 IMPLANT
DRAPE STERI IOBAN 125X83 (DRAPES) ×2 IMPLANT
DRAPE U-SHAPE 47X51 STRL (DRAPES) ×6 IMPLANT
DRSG AQUACEL AG ADV 3.5X10 (GAUZE/BANDAGES/DRESSINGS) ×2 IMPLANT
DURAPREP 26ML APPLICATOR (WOUND CARE) ×2 IMPLANT
ELECT BLADE TIP CTD 4 INCH (ELECTRODE) ×4 IMPLANT
ELECT REM PT RETURN 15FT ADLT (MISCELLANEOUS) ×2 IMPLANT
FACESHIELD WRAPAROUND (MASK) ×8 IMPLANT
GLOVE SRG 8 PF TXTR STRL LF DI (GLOVE) ×2 IMPLANT
GLOVE SURG LTX SZ8 (GLOVE) ×2 IMPLANT
GLOVE SURG UNDER POLY LF SZ8 (GLOVE) ×4
GOWN STRL REUS W/TWL XL LVL3 (GOWN DISPOSABLE) ×4 IMPLANT
HEAD M SROM 36MM PLUS 1.5 (Hips) ×1 IMPLANT
HOOD PEEL AWAY FLYTE STAYCOOL (MISCELLANEOUS) ×4 IMPLANT
KIT BASIN OR (CUSTOM PROCEDURE TRAY) ×2 IMPLANT
KIT TURNOVER KIT A (KITS) ×2 IMPLANT
LINER NEUTRAL 52X36MM PLUS 4 (Liner) ×2 IMPLANT
MARKER SKIN DUAL TIP RULER LAB (MISCELLANEOUS) ×2 IMPLANT
NEEDLE HYPO 22GX1.5 SAFETY (NEEDLE) ×2 IMPLANT
PENCIL SMOKE EVACUATOR (MISCELLANEOUS) IMPLANT
PIN SECTOR W/GRIP ACE CUP 52MM (Hips) ×2 IMPLANT
RTRCTR WOUND ALEXIS 18CM MED (MISCELLANEOUS) ×2
SCREW 6.5MMX25MM (Screw) ×2 IMPLANT
SROM M HEAD 36MM PLUS 1.5 (Hips) ×2 IMPLANT
STAPLER VISISTAT 35W (STAPLE) IMPLANT
STEM FEMORAL SZ5 HIGH ACTIS (Stem) ×2 IMPLANT
STRIP CLOSURE SKIN 1/2X4 (GAUZE/BANDAGES/DRESSINGS) ×2 IMPLANT
SUT ETHIBOND NAB CT1 #1 30IN (SUTURE) ×6 IMPLANT
SUT MNCRL AB 4-0 PS2 18 (SUTURE) IMPLANT
SUT VIC AB 0 CT1 36 (SUTURE) ×2 IMPLANT
SUT VIC AB 1 CT1 36 (SUTURE) ×4 IMPLANT
SUT VIC AB 2-0 CT1 27 (SUTURE) ×4
SUT VIC AB 2-0 CT1 TAPERPNT 27 (SUTURE) ×2 IMPLANT
SYR 30ML LL (SYRINGE) ×4 IMPLANT
TOWEL OR 17X26 10 PK STRL BLUE (TOWEL DISPOSABLE) ×2 IMPLANT
TOWEL OR NON WOVEN STRL DISP B (DISPOSABLE) ×2 IMPLANT
TRAY FOLEY MTR SLVR 16FR STAT (SET/KITS/TRAYS/PACK) ×2 IMPLANT

## 2021-04-23 NOTE — Consult Note (Signed)
Reason for Consult: Right hip pain Referring Physician: Dr. Glennie Hawk Serrano is an 75 y.o. male.  HPI: Antonio Serrano is a 75 year old patient who is ambulatory.  He occasionally uses a cane when he is getting around town.  Patient had a fall yesterday in the driveway.  No syncopal episode or loss of consciousness.  Had immediate onset of right hip pain and was unable to ambulate.  Denies any other orthopedic complaints.  Takes an aspirin daily as a blood thinner but no personal or family history of DVT or pulmonary embolism.  Has wife with him at home.  Past Medical History:  Diagnosis Date   Atherosclerosis of coronary artery    On chest CT->calcif in LAD and RCA   Chronic prostatitis    Very mild elevation of PSA (>4), referred to [facility] where PSA repeat was 1.0 on 06/16/13.  Annual PSA repeat is all that is needed now per urologist.  Most recent 07/2015 was 0.45.   Chronic renal insufficiency, stage III (moderate) (HCC)    Baseline GFR as of 2019= 50s.     Diabetes mellitus with complication ([place]) Dx'd fall 2011   HTN (hypertension)    Renal/aortic doppler u/s normal 06/2010   Hypercalcemia    Hyperlipidemia 2016   Statin intolerant--myalgias.  Pt refuses any further trial of statin as of 2018.   Nonspecific abnormal electrocardiogram (ECG) (EKG) Fall 2011   TWI in inferior leads: myocardial perfusion scan neg and echo normal 07/2010   Tobacco dependence    UTI (lower urinary tract infection)    Feb 2012 (klebsiella--dx at [facility]); 03/2013 e coli.     Past Surgical History:  Procedure Laterality Date   CARDIOVASCULAR STRESS TEST  07/2010   Myocardial perfusion scan neg/low risk.   CATARACT EXTRACTION  2007, 2008   Bilateral ([facility] on [address].   COLONOSCOPY  12/20/04;12/2014   2016 no polyps: recall 5 yrs due to [place] of colon ca   ESOPHAGOGASTRODUODENOSCOPY  11/2004   esoph stricture/dilation   TRANSTHORACIC ECHOCARDIOGRAM  07/2010   EF =>55%; LA mildly dilated;  trace MR/TR;     Family History  Problem Relation Age of Onset   Dementia Mother    Pneumonia Father        died in 69's of pneumonia, was otherwise healthy   Cancer Sister        colon cancer.  Died age 60   Diabetes Paternal Aunt    Diabetes Sister    Colon cancer Neg Hx     Social History:  reports that he has been smoking cigarettes. He has a 12.50 pack-year smoking history. He has never used smokeless tobacco. He reports previous alcohol use. He reports that he does not use drugs.  Allergies:  Allergies  Allergen Reactions   Bactrim [Sulfamethoxazole-Trimethoprim] Nausea Only    Medications: I have reviewed the patient's current medications.  Results for orders placed or performed during the hospital encounter of 04/22/21 (from the past 48 hour(s))  CBC     Status: Abnormal   Collection Time: 04/23/21 12:10 AM  Result Value Ref Range   WBC 11.2 (H) 4.0 - 10.5 K/uL   RBC 4.86 4.22 - 5.81 MIL/uL   Hemoglobin 14.7 13.0 - 17.0 g/dL   HCT 42.9 39.0 - 52.0 %   MCV 88.3 80.0 - 100.0 fL   MCH 30.2 26.0 - 34.0 pg   MCHC 34.3 30.0 - 36.0 g/dL   RDW 14.4 11.5 - 15.5 %  Platelets 234 150 - 400 K/uL   nRBC 0.0 0.0 - 0.2 %    Comment: Performed at [facility], [place] [address] [place], [place] [address]  Comprehensive metabolic panel     Status: Abnormal   Collection Time: 04/23/21 12:10 AM  Result Value Ref Range   Sodium 132 (L) 135 - 145 mmol/L   Potassium 3.6 3.5 - 5.1 mmol/L   Chloride 96 (L) 98 - 111 mmol/L   CO2 24 22 - 32 mmol/L   Glucose, Bld 143 (H) 70 - 99 mg/dL    Comment: Glucose reference range applies only to samples taken after fasting for at least 8 hours.   BUN 18 8 - 23 mg/dL   Creatinine, Ser 1.49 (H) 0.61 - 1.24 mg/dL   Calcium 9.3 8.9 - 10.3 mg/dL   Total Protein 7.1 6.5 - 8.1 g/dL   Albumin 4.2 3.5 - 5.0 g/dL   AST 18 15 - 41 U/L   ALT 11 0 - 44 U/L   Alkaline Phosphatase 87 38 - 126 U/L   Total Bilirubin 0.7 0.3 - 1.2 mg/dL    GFR, Estimated 49 (L) >60 mL/min    Comment: (NOTE) Calculated using the CKD-EPI Creatinine Equation (2021)    Anion gap 12 5 - 15    Comment: Performed at [facility], [place] [address] [place], [place] [address]  Resp Panel by RT-PCR (Flu A&B, Covid) Nasopharyngeal Swab     Status: None   Collection Time: 04/23/21 12:15 AM   Specimen: Nasopharyngeal Swab; Nasopharyngeal(NP) swabs in vial transport medium  Result Value Ref Range   SARS Coronavirus 2 by RT PCR NEGATIVE NEGATIVE    Comment: (NOTE) SARS-CoV-2 target nucleic acids are NOT DETECTED.  The SARS-CoV-2 RNA is generally detectable in upper respiratory specimens during the acute phase of infection. The lowest concentration of SARS-CoV-2 viral copies this assay can detect is 138 copies/mL. A negative result does not preclude SARS-Cov-2 infection and should not be used as the sole basis for treatment or other patient management decisions. A negative result may occur with  improper specimen collection/handling, submission of specimen other than nasopharyngeal swab, presence of viral mutation(s) within the areas targeted by this assay, and inadequate number of viral copies(<138 copies/mL). A negative result must be combined with clinical observations, patient history, and epidemiological information. The expected result is Negative.  Fact Sheet for Patients:  EntrepreneurPulse.com.au  Fact Sheet for Healthcare Providers:  IncredibleEmployment.be  This test is no t yet approved or cleared by the [place] FDA and  has been authorized for detection and/or diagnosis of SARS-CoV-2 by FDA under an Emergency Use Authorization (EUA). This EUA will remain  in effect (meaning this test can be used) for the duration of the COVID-19 declaration under Section 564(b)(1) of the Act, 21 U.S.C.section 360bbb-3(b)(1), unless the authorization is terminated  or revoked sooner.        Influenza A by PCR NEGATIVE NEGATIVE   Influenza B by PCR NEGATIVE NEGATIVE    Comment: (NOTE) The Xpert Xpress SARS-CoV-2/FLU/RSV plus assay is intended as an aid in the diagnosis of influenza from Nasopharyngeal swab specimens and should not be used as a sole basis for treatment. Nasal washings and aspirates are unacceptable for Xpert Xpress SARS-CoV-2/FLU/RSV testing.  Fact Sheet for Patients: EntrepreneurPulse.com.au  Fact Sheet for Healthcare Providers: IncredibleEmployment.be  This test is not yet approved or cleared by the [place] FDA and has been authorized for detection and/or diagnosis  of SARS-CoV-2 by FDA under an Emergency Use Authorization (EUA). This EUA will remain in effect (meaning this test can be used) for the duration of the COVID-19 declaration under Section 564(b)(1) of the Act, 21 U.S.C. section 360bbb-3(b)(1), unless the authorization is terminated or revoked.  Performed at [facility], [place] [address], [place], [place] [address]   APTT     Status: Abnormal   Collection Time: 04/23/21  1:19 AM  Result Value Ref Range   aPTT 38 (H) 24 - 36 seconds    Comment:        IF BASELINE aPTT IS ELEVATED, SUGGEST PATIENT RISK ASSESSMENT BE USED TO DETERMINE APPROPRIATE ANTICOAGULANT THERAPY. Performed at [facility], [place] [address] [place], [place] [address]   Protime-INR     Status: None   Collection Time: 04/23/21  1:19 AM  Result Value Ref Range   Prothrombin Time 13.9 11.4 - 15.2 seconds   INR 1.1 0.8 - 1.2    Comment: (NOTE) INR goal varies based on device and disease states. Performed at [facility], [place] [address] [place], [place] [address]   CBC     Status: None   Collection Time: 04/23/21  4:40 AM  Result Value Ref Range   WBC 8.5 4.0 - 10.5 K/uL   RBC 4.71 4.22 - 5.81 MIL/uL   Hemoglobin 14.3 13.0 - 17.0 g/dL   HCT 41.8 39.0 - 52.0 %    MCV 88.7 80.0 - 100.0 fL   MCH 30.4 26.0 - 34.0 pg   MCHC 34.2 30.0 - 36.0 g/dL   RDW 14.4 11.5 - 15.5 %   Platelets 209 150 - 400 K/uL   nRBC 0.0 0.0 - 0.2 %    Comment: Performed at [facility], [place] [address] [place], [place] [id number]  Basic metabolic panel     Status: Abnormal   Collection Time: 04/23/21  4:40 AM  Result Value Ref Range   Sodium 133 (L) 135 - 145 mmol/L   Potassium 3.4 (L) 3.5 - 5.1 mmol/L   Chloride 97 (L) 98 - 111 mmol/L   CO2 24 22 - 32 mmol/L   Glucose, Bld 125 (H) 70 - 99 mg/dL    Comment: Glucose reference range applies only to samples taken after fasting for at least 8 hours.   BUN 17 8 - 23 mg/dL   Creatinine, Ser 1.27 (H) 0.61 - 1.24 mg/dL   Calcium 9.3 8.9 - 10.3 mg/dL   GFR, Estimated 60 (L) >60 mL/min    Comment: (NOTE) Calculated using the CKD-EPI Creatinine Equation (2021)    Anion gap 12 5 - 15    Comment: Performed at [facility], [place] [address], [place], [place] [address]  Type and screen [place]     Status: None   Collection Time: 04/23/21  4:40 AM  Result Value Ref Range   ABO/RH(D) O POS    Antibody Screen NEG    Sample Expiration      04/26/2021,2359 Performed at [facility], [place] [address] [place], [place] [address]     CT HEAD WO CONTRAST  Result Date: 04/22/2021 CLINICAL DATA:  Status post fall. EXAM: CT HEAD WITHOUT CONTRAST TECHNIQUE: Contiguous axial images were obtained from the base of the skull through the vertex without intravenous contrast. COMPARISON:  August 09, 2010 FINDINGS: Brain: There is mild cerebral atrophy with widening of the extra-axial spaces and ventricular dilatation. There  are areas of decreased attenuation within the white matter tracts of the supratentorial brain, consistent with microvascular disease changes. This is most prominent within the anterior aspect of the right parietal region and left occipital lobe and is  increased in severity when compared to the prior exam. A small, chronic right basal ganglia lacunar infarct is seen. Vascular: No hyperdense vessel or unexpected calcification. Skull: Normal. Negative for fracture or focal lesion. Sinuses/Orbits: No acute finding. Other: None. IMPRESSION: 1. Generalized cerebral atrophy and chronic microvascular white matter disease. 2. No acute intracranial abnormality. Electronically Signed   By: Virgina Norfolk M.D.   On: 04/22/2021 22:22   CT CERVICAL SPINE WO CONTRAST  Result Date: 04/22/2021 CLINICAL DATA:  Status post fall. EXAM: CT CERVICAL SPINE WITHOUT CONTRAST TECHNIQUE: Multidetector CT imaging of the cervical spine was performed without intravenous contrast. Multiplanar CT image reconstructions were also generated. COMPARISON:  None. FINDINGS: Alignment: Normal. Skull base and vertebrae: No acute fracture. Chronic changes are seen involving the body and tip of the dens. Soft tissues and spinal canal: No prevertebral fluid or swelling. No visible canal hematoma. Disc levels: Mild to moderate severity multilevel endplate sclerosis and osteophyte formation is seen. This is most prominent at the level of C6-C7. There is marked severity narrowing of the anterior atlantoaxial articulation. Mild multilevel intervertebral disc space narrowing is noted, most prominent at the level of C4-C5. Bilateral marked severity multilevel facet joint hypertrophy is noted. Upper chest: Negative. Other: None. IMPRESSION: Multilevel degenerative changes without an acute cervical spine fracture. Electronically Signed   By: Virgina Norfolk M.D.   On: 04/22/2021 22:28   DG Pelvis Portable  Result Date: 04/22/2021 CLINICAL DATA:  Status post fall. EXAM: PORTABLE PELVIS 1-2 VIEWS COMPARISON:  None. FINDINGS: An acute fracture deformity is seen extending through the neck of the proximal right femur. Mild lateral displacement of the distal fracture site is noted. There is no evidence of  dislocation. A 14 mm x 8 mm well-defined, benign-appearing lucency is seen within the medullary region of the proximal right femoral shaft. Soft tissue structures are remarkable for mild to moderate severity vascular calcification. IMPRESSION: Acute fracture of the proximal right femur. Electronically Signed   By: Virgina Norfolk M.D.   On: 04/22/2021 22:15   Chest Portable 1 View  Result Date: 04/23/2021 CLINICAL DATA:  Preoperative chest x-ray for right hip fracture EXAM: PORTABLE CHEST 1 VIEW COMPARISON:  None. FINDINGS: No consolidation, features of edema, pneumothorax, or effusion. Pulmonary vascularity is normally distributed. The cardiomediastinal contours are unremarkable. No acute osseous or soft tissue abnormality. IMPRESSION: No acute cardiopulmonary abnormality. Electronically Signed   By: Lovena Le M.D.   On: 04/23/2021 02:14   DG FEMUR, MIN 2 VIEWS RIGHT  Result Date: 04/22/2021 CLINICAL DATA:  Status post fall. EXAM: RIGHT FEMUR 2 VIEWS COMPARISON:  None. FINDINGS: An acute fracture deformity is seen extending through the neck of the proximal right femur. Mild lateral displacement of the distal fracture site is noted. There is no evidence of dislocation. A 14 mm x 8 mm well-defined, benign-appearing lucency is seen within the medullary region of the proximal right femoral shaft. Soft tissue structures are remarkable for mild to moderate severity vascular calcification. IMPRESSION: Acute fracture of the proximal right femur. Electronically Signed   By: Virgina Norfolk M.D.   On: 04/22/2021 22:17    Review of Systems  Musculoskeletal:  Positive for arthralgias.  All other systems reviewed and are negative. Blood pressure (!) 166/68, pulse 64,  temperature 97.8 F (36.6 C), temperature source Oral, resp. rate 20, height '5\' 6"'$  (1.676 m), weight 74.8 kg, SpO2 98 %. Physical Exam Vitals reviewed.  HENT:     Head: Normocephalic.     Nose: Nose normal.  Eyes:     Pupils: Pupils are  equal, round, and reactive to light.  Cardiovascular:     Rate and Rhythm: Normal rate.     Pulses: Normal pulses.  Pulmonary:     Effort: Pulmonary effort is normal.  Abdominal:     General: Abdomen is flat.  Musculoskeletal:     Cervical back: Normal range of motion.  Skin:    General: Skin is warm.     Capillary Refill: Capillary refill takes less than 2 seconds.  Neurological:     General: No focal deficit present.     Mental Status: He is alert.  Psychiatric:        Mood and Affect: Mood normal.  Examination of bilateral upper extremities demonstrates bilateral grip strength radial pulses bilaterally.  Range of motion of the elbow wrist and shoulder are intact with no coarse grinding or crepitus.  Left lower extremity demonstrates intact pedal pulses with intact ankle dorsiflexion plantarflexion strength with no pain with range of motion of the left hip or knee.  On the right-hand side there is no ankle or knee swelling or effusion.  No abrasion on the right hip region.  Leg has mild shortening.  Assessment/Plan: Impression is right hip fracture and is ambulatory patient who has a wife at home.  Plan is right total hip replacement.  Risk and benefits are discussed with the patient including but not limited to infection nerve vessel damage shortening dislocation as well as leg length inequality.  Patient understands the risk and benefits and wishes to proceed.  All questions answered  Anderson Malta 04/23/2021, 7:13 AM

## 2021-04-23 NOTE — Transfer of Care (Signed)
Immediate Anesthesia Transfer of Care Note  Patient: Antonio Serrano  Procedure(s) Performed: Procedure(s) with comments: TOTAL HIP ARTHROPLASTY ANTERIOR APPROACH (Right) - Hana, C-arm, Depuy  Patient Location: PACU  Anesthesia Type:Spinal  Level of Consciousness: awake, alert  and oriented  Airway & Oxygen Therapy: Patient Spontanous Breathing  Post-op Assessment: Report given to RN and Post -op Vital signs reviewed and stable  Post vital signs: Reviewed and stable  Last Vitals:  Vitals:   04/23/21 0200 04/23/21 0512  BP: (!) 122/57 (!) 166/68  Pulse: 61 64  Resp: 15 20  Temp:  36.6 C  SpO2: [id number]    Complications: No apparent anesthesia complications

## 2021-04-23 NOTE — Plan of Care (Signed)
  Problem: Education: Goal: Knowledge of General Education information will improve Description Including pain rating scale, medication(s)/side effects and non-pharmacologic comfort measures Outcome: Progressing   

## 2021-04-23 NOTE — Anesthesia Procedure Notes (Signed)
Spinal  Patient location during procedure: OR Start time: 04/23/2021 7:45 AM End time: 04/23/2021 7:52 AM Reason for block: surgical anesthesia Staffing Anesthesiologist: Janeece Riggers, MD Preanesthetic Checklist Completed: patient identified, IV checked, site marked, risks and benefits discussed, surgical consent, monitors and equipment checked, pre-op evaluation and timeout performed Spinal Block Patient position: sitting Prep: DuraPrep Patient monitoring: heart rate, cardiac monitor, continuous pulse ox and blood pressure Approach: midline Location: L3-4 Injection technique: single-shot Needle Needle type: Sprotte  Needle gauge: 24 G Needle length: 9 cm Assessment Sensory level: T4 Events: CSF return

## 2021-04-23 NOTE — Anesthesia Preprocedure Evaluation (Signed)
Anesthesia Evaluation  Patient identified by MRN, date of birth, ID band Patient awake    Reviewed: Allergy & Precautions, NPO status , Patient's Chart, lab work & pertinent test results  Airway Mallampati: II  TM Distance: >3 FB Neck ROM: Full    Dental no notable dental hx. (+) Edentulous Upper, Partial Lower, Poor Dentition, Chipped, Missing, Dental Advisory Given,    Pulmonary neg pulmonary ROS, Current Smoker,    Pulmonary exam normal breath sounds clear to auscultation       Cardiovascular hypertension, Pt. on medications + CAD  Normal cardiovascular exam Rhythm:Regular Rate:Normal  TRANSTHORACIC ECHOCARDIOGRAM  07/2010   EF =>55%; LA mildly dilated; trace MR/TR;      Neuro/Psych negative neurological ROS  negative psych ROS   GI/Hepatic negative GI ROS, Neg liver ROS,   Endo/Other  diabetes  Renal/GU negative Renal ROS  negative genitourinary   Musculoskeletal negative musculoskeletal ROS (+)   Abdominal   Peds negative pediatric ROS (+)  Hematology negative hematology ROS (+)   Anesthesia Other Findings   Reproductive/Obstetrics negative OB ROS                             Anesthesia Physical Anesthesia Plan  ASA: 3 and emergent  Anesthesia Plan: Spinal   Post-op Pain Management:    Induction:   PONV Risk Score and Plan: 1 and Treatment may vary due to age or medical condition  Airway Management Planned: Nasal Cannula and Natural Airway  Additional Equipment:   Intra-op Plan:   Post-operative Plan:   Informed Consent:     Dental advisory given  Plan Discussed with: Anesthesiologist  Anesthesia Plan Comments: (  )        Anesthesia Quick Evaluation

## 2021-04-23 NOTE — ED Notes (Signed)
Pt transported to PACU. Belongings given to wife.

## 2021-04-23 NOTE — Anesthesia Postprocedure Evaluation (Signed)
Anesthesia Post Note  Patient: Antonio Serrano  Procedure(s) Performed: TOTAL HIP ARTHROPLASTY ANTERIOR APPROACH (Right: Hip)     Patient location during evaluation: PACU Anesthesia Type: Spinal Level of consciousness: oriented and awake and alert Pain management: pain level controlled Vital Signs Assessment: post-procedure vital signs reviewed and stable Respiratory status: spontaneous breathing, respiratory function stable and patient connected to nasal cannula oxygen Cardiovascular status: blood pressure returned to baseline and stable Postop Assessment: no headache, no backache and no apparent nausea or vomiting Anesthetic complications: no   No notable events documented.  Last Vitals:  Vitals:   04/23/21 1055 04/23/21 1100  BP: 122/69 118/68  Pulse: 67 66  Resp: 13 14  Temp:    SpO2: 99% 97%    Last Pain:  Vitals:   04/23/21 1100  TempSrc:   PainSc: 0-No pain                 Andron Marrazzo

## 2021-04-23 NOTE — Brief Op Note (Signed)
   04/23/2021  10:44 AM  PATIENT:  [place] Serrano  75 y.o. male  PRE-OPERATIVE DIAGNOSIS:  Right Hip Fracture  POST-OPERATIVE DIAGNOSIS:  Right Hip Fracture  PROCEDURE:  Procedure(s): TOTAL HIP ARTHROPLASTY ANTERIOR APPROACH  SURGEON:  Surgeon(s): Meredith Pel, MD  ASSISTANT: magnant pa  ANESTHESIA:   spinal  EBL: 250 ml    Total I/O In: 1000 [I.V.:800; IV Piggyback:200] Out: 300 [Blood:300]  BLOOD ADMINISTERED: none  DRAINS: none   LOCAL MEDICATIONS USED:  vanco and exparel  SPECIMEN:  No Specimen  COUNTS:  YES  TOURNIQUET:  * No tourniquets in log *  DICTATION: .Other Dictation: Dictation Number [id number]  PLAN OF CARE: Admit to inpatient   PATIENT DISPOSITION:  PACU - hemodynamically stable

## 2021-04-23 NOTE — Progress Notes (Signed)
PROGRESS NOTE    Antonio Serrano  [id number] DOB: 1946-03-29 DOA: 04/22/2021 PCP: Tammi Sou, MD    Brief Narrative:  75 year old gentleman with history of type 2 diabetes, hypertension, CKD stage IIIa with baseline creatinine 1.2-1.4 brought to the ER with right hip pain after fall at home.  In the emergency room, hemodynamically stable.  Found to have right femoral neck fracture.  Admitted for surgery.   Assessment & Plan:   Principal Problem:   Closed right hip fracture, initial encounter ([place]) Active Problems:   HTN (hypertension), benign   Tobacco dependence   Chronic renal insufficiency, stage III (moderate) (HCC)   Type 2 diabetes mellitus with stage 3a chronic kidney disease, without long-term current use of insulin (HCC)  Closed traumatic right hip fracture: Status post right total hip Dr. Marlou Sa 7/24 Mobility, full weightbearing starting 7/25 with PT OT Adequate pain medications with IV and oral opiates along with laxatives. DVT prophylaxis with subcu Lovenox starting tomorrow. Further surgical management as per surgery.  Type 2 diabetes on Actos at home, well controlled: Remains on insulin sliding scale perioperative.  Is stable.  Essential hypertension: Blood pressure stable on home medications.  Continued.  Chronic kidney disease stage IIIa: At about baseline.  Hypokalemia: Replaced.    DVT prophylaxis: SCDs Start: 04/23/21 1254 Place TED hose Start: 04/23/21 1254 SCDs Start: 04/23/21 0119 Place and maintain sequential compression device Start: 04/22/21 2349   Code Status: Full code Family Communication: wife and daughter at the bedside Disposition Plan: Status is: Inpatient  Remains inpatient appropriate because:Inpatient level of care appropriate due to severity of illness  Dispo: The patient is from: Home              Anticipated d/c is to: To be decided after rehab evaluation.              Patient currently is not medically stable to d/c.    Difficult to place patient No         Consultants:  Orthopedics  Procedures:  ORIF with right total hip 7/24  Antimicrobials:  Perioperative antibiotics Ancef   Subjective: Patient seen and examined.  Came back from surgery.  Wife and daughter at the bedside.  Patient has no complaints.  Objective: Vitals:   04/23/21 1115 04/23/21 1130 04/23/21 1200 04/23/21 1244  BP: 109/75 115/74 104/83 130/74  Pulse: 68 68 66 67  Resp: '16 11 13 14  '$ Temp:   (!) 97.1 F (36.2 C) 97.6 F (36.4 C)  TempSrc:    Oral  SpO2: 98% 99% 96% 95%  Weight:      Height:        Intake/Output Summary (Last 24 hours) at 04/23/2021 1356 Last data filed at 04/23/2021 1040 Gross per 24 hour  Intake 1000 ml  Output 300 ml  Net 700 ml   Filed Weights   04/22/21 2032  Weight: 74.8 kg    Examination:  General: Looks comfortable.  Mostly on room air. Cardiovascular: S1-S2 normal.  Regular rate rhythm. Respiratory: Bilateral clear.  No added sounds. Gastrointestinal: Soft and nontender.  Bowel sounds present. Ext: No sinus edema.  Right hip immediate postop incisions clean and dry.  Distal neurovascular status intact.      Data Reviewed: I have personally reviewed following labs and imaging studies  CBC: Recent Labs  Lab 04/23/21 0010 04/23/21 0440  WBC 11.2* 8.5  HGB 14.7 14.3  HCT 42.9 41.8  MCV 88.3 88.7  PLT 234 209  Basic Metabolic Panel: Recent Labs  Lab 04/23/21 0010 04/23/21 0440  NA 132* 133*  K 3.6 3.4*  CL 96* 97*  CO2 24 24  GLUCOSE 143* 125*  BUN 18 17  CREATININE 1.49* 1.27*  CALCIUM 9.3 9.3   GFR: Estimated Creatinine Clearance: 46.7 mL/min (A) (by C-G formula based on SCr of 1.27 mg/dL (H)). Liver Function Tests: Recent Labs  Lab 04/23/21 0010  AST 18  ALT 11  ALKPHOS 87  BILITOT 0.7  PROT 7.1  ALBUMIN 4.2   No results for input(s): LIPASE, AMYLASE in the last 168 hours. No results for input(s): AMMONIA in the last 168 hours. Coagulation  Profile: Recent Labs  Lab 04/23/21 0119  INR 1.1   Cardiac Enzymes: No results for input(s): CKTOTAL, CKMB, CKMBINDEX, TROPONINI in the last 168 hours. BNP (last 3 results) No results for input(s): PROBNP in the last 8760 hours. HbA1C: No results for input(s): HGBA1C in the last 72 hours. CBG: Recent Labs  Lab 04/23/21 1053  GLUCAP 135*   Lipid Profile: No results for input(s): CHOL, HDL, LDLCALC, TRIG, CHOLHDL, LDLDIRECT in the last 72 hours. Thyroid Function Tests: No results for input(s): TSH, T4TOTAL, FREET4, T3FREE, THYROIDAB in the last 72 hours. Anemia Panel: No results for input(s): VITAMINB12, FOLATE, FERRITIN, TIBC, IRON, RETICCTPCT in the last 72 hours. Sepsis Labs: No results for input(s): PROCALCITON, LATICACIDVEN in the last 168 hours.  Recent Results (from the past 240 hour(s))  Resp Panel by RT-PCR (Flu A&B, Covid) Nasopharyngeal Swab     Status: None   Collection Time: 04/23/21 12:15 AM   Specimen: Nasopharyngeal Swab; Nasopharyngeal(NP) swabs in vial transport medium  Result Value Ref Range Status   SARS Coronavirus 2 by RT PCR NEGATIVE NEGATIVE Final    Comment: (NOTE) SARS-CoV-2 target nucleic acids are NOT DETECTED.  The SARS-CoV-2 RNA is generally detectable in upper respiratory specimens during the acute phase of infection. The lowest concentration of SARS-CoV-2 viral copies this assay can detect is 138 copies/mL. A negative result does not preclude SARS-Cov-2 infection and should not be used as the sole basis for treatment or other patient management decisions. A negative result may occur with  improper specimen collection/handling, submission of specimen other than nasopharyngeal swab, presence of viral mutation(s) within the areas targeted by this assay, and inadequate number of viral copies(<138 copies/mL). A negative result must be combined with clinical observations, patient history, and epidemiological information. The expected result is  Negative.  Fact Sheet for Patients:  EntrepreneurPulse.com.au  Fact Sheet for Healthcare Providers:  IncredibleEmployment.be  This test is no t yet approved or cleared by the [place] FDA and  has been authorized for detection and/or diagnosis of SARS-CoV-2 by FDA under an Emergency Use Authorization (EUA). This EUA will remain  in effect (meaning this test can be used) for the duration of the COVID-19 declaration under Section 564(b)(1) of the Act, 21 U.S.C.section 360bbb-3(b)(1), unless the authorization is terminated  or revoked sooner.       Influenza A by PCR NEGATIVE NEGATIVE Final   Influenza B by PCR NEGATIVE NEGATIVE Final    Comment: (NOTE) The Xpert Xpress SARS-CoV-2/FLU/RSV plus assay is intended as an aid in the diagnosis of influenza from Nasopharyngeal swab specimens and should not be used as a sole basis for treatment. Nasal washings and aspirates are unacceptable for Xpert Xpress SARS-CoV-2/FLU/RSV testing.  Fact Sheet for Patients: EntrepreneurPulse.com.au  Fact Sheet for Healthcare Providers: IncredibleEmployment.be  This test is not yet approved or  cleared by the [place] and has been authorized for detection and/or diagnosis of SARS-CoV-2 by FDA under an Emergency Use Authorization (EUA). This EUA will remain in effect (meaning this test can be used) for the duration of the COVID-19 declaration under Section 564(b)(1) of the Act, 21 U.S.C. section 360bbb-3(b)(1), unless the authorization is terminated or revoked.  Performed at [facility], [place] [address], [place], [place] [address]          Radiology Studies: CT HEAD WO CONTRAST  Result Date: 04/22/2021 CLINICAL DATA:  Status post fall. EXAM: CT HEAD WITHOUT CONTRAST TECHNIQUE: Contiguous axial images were obtained from the base of the skull through the vertex without intravenous contrast.  COMPARISON:  August 09, 2010 FINDINGS: Brain: There is mild cerebral atrophy with widening of the extra-axial spaces and ventricular dilatation. There are areas of decreased attenuation within the white matter tracts of the supratentorial brain, consistent with microvascular disease changes. This is most prominent within the anterior aspect of the right parietal region and left occipital lobe and is increased in severity when compared to the prior exam. A small, chronic right basal ganglia lacunar infarct is seen. Vascular: No hyperdense vessel or unexpected calcification. Skull: Normal. Negative for fracture or focal lesion. Sinuses/Orbits: No acute finding. Other: None. IMPRESSION: 1. Generalized cerebral atrophy and chronic microvascular white matter disease. 2. No acute intracranial abnormality. Electronically Signed   By: Virgina Norfolk M.D.   On: 04/22/2021 22:22   CT CERVICAL SPINE WO CONTRAST  Result Date: 04/22/2021 CLINICAL DATA:  Status post fall. EXAM: CT CERVICAL SPINE WITHOUT CONTRAST TECHNIQUE: Multidetector CT imaging of the cervical spine was performed without intravenous contrast. Multiplanar CT image reconstructions were also generated. COMPARISON:  None. FINDINGS: Alignment: Normal. Skull base and vertebrae: No acute fracture. Chronic changes are seen involving the body and tip of the dens. Soft tissues and spinal canal: No prevertebral fluid or swelling. No visible canal hematoma. Disc levels: Mild to moderate severity multilevel endplate sclerosis and osteophyte formation is seen. This is most prominent at the level of C6-C7. There is marked severity narrowing of the anterior atlantoaxial articulation. Mild multilevel intervertebral disc space narrowing is noted, most prominent at the level of C4-C5. Bilateral marked severity multilevel facet joint hypertrophy is noted. Upper chest: Negative. Other: None. IMPRESSION: Multilevel degenerative changes without an acute cervical spine  fracture. Electronically Signed   By: Virgina Norfolk M.D.   On: 04/22/2021 22:28   Pelvis Portable  Result Date: 04/23/2021 CLINICAL DATA:  History of right femoral neck fracture. EXAM: PORTABLE PELVIS 1-2 VIEWS COMPARISON:  Right hip radiographs dated 04/22/2021. FINDINGS: Intraoperative fluoroscopy images demonstrate postoperative appearance of a total right hip arthroplasty. No evidence of hardware failure or malalignment. IMPRESSION: Postoperative appearance of total right hip arthroplasty. Electronically Signed   By: Zerita Boers M.D.   On: 04/23/2021 11:47   DG Pelvis Portable  Result Date: 04/22/2021 CLINICAL DATA:  Status post fall. EXAM: PORTABLE PELVIS 1-2 VIEWS COMPARISON:  None. FINDINGS: An acute fracture deformity is seen extending through the neck of the proximal right femur. Mild lateral displacement of the distal fracture site is noted. There is no evidence of dislocation. A 14 mm x 8 mm well-defined, benign-appearing lucency is seen within the medullary region of the proximal right femoral shaft. Soft tissue structures are remarkable for mild to moderate severity vascular calcification. IMPRESSION: Acute fracture of the proximal right femur. Electronically Signed   By: Joyce Gross.D.  On: 04/22/2021 22:15   Chest Portable 1 View  Result Date: 04/23/2021 CLINICAL DATA:  Preoperative chest x-ray for right hip fracture EXAM: PORTABLE CHEST 1 VIEW COMPARISON:  None. FINDINGS: No consolidation, features of edema, pneumothorax, or effusion. Pulmonary vascularity is normally distributed. The cardiomediastinal contours are unremarkable. No acute osseous or soft tissue abnormality. IMPRESSION: No acute cardiopulmonary abnormality. Electronically Signed   By: Lovena Le M.D.   On: 04/23/2021 02:14   DG C-Arm 1-60 Min-No Report  Result Date: 04/23/2021 Fluoroscopy was utilized by the requesting physician.  No radiographic interpretation.   DG HIP OPERATIVE UNILAT WITH PELVIS  RIGHT  Result Date: 04/23/2021 CLINICAL DATA:  History of right hip fracture. EXAM: OPERATIVE RIGHT HIP (WITH PELVIS IF PERFORMED)  VIEWS TECHNIQUE: Fluoroscopic spot image(s) were submitted for interpretation post-operatively. COMPARISON:  Right hip radiographs dated 04/22/2021. FINDINGS: Intraoperative fluoroscopy images demonstrate postoperative appearance of a total right hip arthroplasty. No evidence of hardware failure or malalignment. IMPRESSION: Postoperative appearance of total right hip arthroplasty. Electronically Signed   By: Zerita Boers M.D.   On: 04/23/2021 11:46   DG FEMUR, MIN 2 VIEWS RIGHT  Result Date: 04/22/2021 CLINICAL DATA:  Status post fall. EXAM: RIGHT FEMUR 2 VIEWS COMPARISON:  None. FINDINGS: An acute fracture deformity is seen extending through the neck of the proximal right femur. Mild lateral displacement of the distal fracture site is noted. There is no evidence of dislocation. A 14 mm x 8 mm well-defined, benign-appearing lucency is seen within the medullary region of the proximal right femoral shaft. Soft tissue structures are remarkable for mild to moderate severity vascular calcification. IMPRESSION: Acute fracture of the proximal right femur. Electronically Signed   By: Virgina Norfolk M.D.   On: 04/22/2021 22:17        Scheduled Meds:  acetaminophen  1,000 mg Oral Q6H   aspirin  81 mg Oral BID   carvedilol  6.25 mg Oral BID WC    ceFAZolin (ANCEF) IV  2 g Intravenous Q8H   docusate sodium  100 mg Oral BID   insulin aspart  0-9 Units Subcutaneous TID WC   ipratropium  1 spray Each Nare BID   irbesartan  75 mg Oral Daily   melatonin  5 mg Oral QHS   pantoprazole  40 mg Oral Daily   potassium chloride  20 mEq Oral BID   ceFAZolin (ANCEF) IVPB 2 gram/100 mL NS (Mini-Bag Plus)       Continuous Infusions:  lactated ringers     methocarbamol (ROBAXIN) IV     tranexamic acid       LOS: 1 day    Time spent: 25 minutes    Barb Merino, MD [facility] Pager [phone]

## 2021-04-23 NOTE — Progress Notes (Signed)
Handbook has been given to the patient.

## 2021-04-24 ENCOUNTER — Encounter (HOSPITAL_COMMUNITY): Payer: Self-pay | Admitting: Orthopedic Surgery

## 2021-04-24 DIAGNOSIS — S72001A Fracture of unspecified part of neck of right femur, initial encounter for closed fracture: Secondary | ICD-10-CM | POA: Diagnosis not present

## 2021-04-24 LAB — GLUCOSE, CAPILLARY
Glucose-Capillary: 133 mg/dL — ABNORMAL HIGH (ref 70–99)
Glucose-Capillary: 157 mg/dL — ABNORMAL HIGH (ref 70–99)
Glucose-Capillary: 100 mg/dL — ABNORMAL HIGH (ref 70–99)
Glucose-Capillary: 124 mg/dL — ABNORMAL HIGH (ref 70–99)

## 2021-04-24 LAB — BASIC METABOLIC PANEL
Anion gap: 8 (ref 5–15)
BUN: 20 mg/dL (ref 8–23)
CO2: 26 mmol/L (ref 22–32)
Calcium: 8.8 mg/dL — ABNORMAL LOW (ref 8.9–10.3)
Chloride: 97 mmol/L — ABNORMAL LOW (ref 98–111)
Creatinine, Ser: 1.41 mg/dL — ABNORMAL HIGH (ref 0.61–1.24)
GFR, Estimated: 53 mL/min — ABNORMAL LOW (ref >60)
Glucose, Bld: 153 mg/dL — ABNORMAL HIGH (ref 70–99)
Potassium: 4.1 mmol/L (ref 3.5–5.1)
Sodium: 131 mmol/L — ABNORMAL LOW (ref 135–145)

## 2021-04-24 LAB — CBC
HCT: 37.6 % — ABNORMAL LOW (ref 39.0–52.0)
Hemoglobin: 12.6 g/dL — ABNORMAL LOW (ref 13.0–17.0)
MCH: 30.1 pg (ref 26.0–34.0)
MCHC: 33.5 g/dL (ref 30.0–36.0)
MCV: 90 fL (ref 80.0–100.0)
Platelets: 209 10*3/uL (ref 150–400)
RBC: 4.18 MIL/uL — ABNORMAL LOW (ref 4.22–5.81)
RDW: 14.5 % (ref 11.5–15.5)
WBC: 13.3 10*3/uL — ABNORMAL HIGH (ref 4.0–10.5)
nRBC: 0 % (ref 0.0–0.2)

## 2021-04-24 LAB — MAGNESIUM: Magnesium: 2.1 mg/dL (ref 1.7–2.4)

## 2021-04-24 LAB — PHOSPHORUS: Phosphorus: 3.7 mg/dL (ref 2.5–4.6)

## 2021-04-24 MED ORDER — ADULT MULTIVITAMIN W/MINERALS CH
1.0000 | ORAL_TABLET | Freq: Every day | ORAL | Status: DC
  Administered 2021-04-24 – 2021-04-25 (×2): 1 via ORAL
  Filled 2021-04-24 (×2): qty 1

## 2021-04-24 MED ORDER — ENSURE SURGERY PO LIQD: 237.0000 mL | ORAL | Status: DC

## 2021-04-24 MED ORDER — PANTOPRAZOLE SODIUM 40 MG PO TBEC
40.0000 mg | DELAYED_RELEASE_TABLET | Freq: Once | ORAL | Status: AC
  Administered 2021-04-24: 40 mg via ORAL
  Filled 2021-04-24: qty 1

## 2021-04-24 MED ORDER — ENSURE MAX PROTEIN PO LIQD
11.0000 [oz_av] | Freq: Every day | ORAL | Status: DC
  Administered 2021-04-24: 11 [oz_av] via ORAL
  Filled 2021-04-24: qty 330

## 2021-04-24 MED ORDER — ALUM & MAG HYDROXIDE-SIMETH 200-200-20 MG/5ML PO SUSP
30.0000 mL | Freq: Four times a day (QID) | ORAL | Status: DC | PRN
  Administered 2021-04-24: 30 mL via ORAL
  Filled 2021-04-24: qty 30

## 2021-04-24 NOTE — Progress Notes (Signed)
Initial Nutrition Assessment  DOCUMENTATION CODES:   Not applicable  INTERVENTION:  - will order Ensure Max once/day, each supplement provides 150 kcal and 30 grams protein.  - will order 1 tablet multivitamin with minerals/day.   NUTRITION DIAGNOSIS:   Increased nutrient needs related to hip fracture, post-op healing as evidenced by estimated needs.  GOAL:   Patient will meet greater than or equal to 90% of their needs  MONITOR:   PO intake, Supplement acceptance, Labs, Weight trends  REASON FOR ASSESSMENT:   Consult Hip fracture protocol  ASSESSMENT:   75 y.o. male with medical history of DM, HTN, stage 3 CKD, tobacco abuse, chronic prostatitis, and atherosclerosis. He presented to the ED on 7/23 with R hip pain and inability to ambulate after a fall at home on 7/23.  He is POD #1 R total hip replacement.  Ortho note from today states likely d/c tomorrow (7/26).  Weight on 7/23 was 165 lb and weight has been stable since at least 05/15/2019. Diet was advanced yesterday at 1255 following surgery.    Labs reviewed; CBGs: 133 and 157 mg/dl, Na: 131 mmol/l, Cl: 97 mmol/l, creatinine: 1.41 mg/dl, GFR: 53 ml/min. Medications reviewed; 100 mg colace BID, sliding scale novolog, 5 mg melatonin/day, 40 mg oral protonix/day, 20 mEq Klor-Con BID. IVF; LR @ 75 ml/hr.    NUTRITION - FOCUSED PHYSICAL EXAM:  No muscle or fat depletions, mild edema to RLE.  Diet Order:   Diet Order             Diet Carb Modified Fluid consistency: Thin; Room service appropriate? Yes  Diet effective now                   EDUCATION NEEDS:   No education needs have been identified at this time  Skin:  Skin Assessment: Skin Integrity Issues: Skin Integrity Issues:: Incisions Incisions: R hip (7/24)  Last BM:  7/23 (date of admission)  Height:   Ht Readings from Last 1 Encounters:  04/22/21 '5\' 6"'$  (1.676 m)    Weight:   Wt Readings from Last 1 Encounters:  04/22/21 74.8 kg      Estimated Nutritional Needs:  Kcal:  1800-2000 kcal Protein:  90-105 grams Fluid:  >/= 1.8 L/day       Jarome Matin, MS, RD, LDN, CNSC Inpatient Clinical Dietitian RD pager # available in [facility]  After hours/weekend pager # available in [facility]

## 2021-04-24 NOTE — Progress Notes (Signed)
Physical Therapy Treatment Patient Details Name: Antonio Serrano MRN: [id number] DOB: Jul 01, 1946 Today's Date: 04/24/2021    History of Present Illness Pt s/p fall with R hip fx and now s/p R THR by anterior direct approach.  Pt with hx of DM    PT Comments    Pt continues very motivated but with marked instability with move to standing and with initial ambulation - assist required to prevent falling. Pt states he thinks it is the pain meds.  Pt stability improving with increased distance ambulated and pt progressed to min guard assist and reciprocal gait.  Pt eager for dc home tomorrow.   Follow Up Recommendations  Home health PT     Equipment Recommendations  None recommended by PT    Recommendations for Other Services       Precautions / Restrictions Precautions Precautions: Fall Restrictions Weight Bearing Restrictions: No RLE Weight Bearing: Weight bearing as tolerated    Mobility  Bed Mobility Overal bed mobility: Needs Assistance Bed Mobility: Sit to Supine       Sit to supine: Min guard   General bed mobility comments: cues for sequence and use of L LE to self assist    Transfers Overall transfer level: Needs assistance Equipment used: Rolling walker (2 wheeled) Transfers: Sit to/from Stand Sit to Stand: Min assist         General transfer comment: cues for LE management and use of UEs to self assist.  Physical assist to correct for posterior balance loss with initial standing  Ambulation/Gait Ambulation/Gait assistance: Min assist;Min guard Gait Distance (Feet): 145 Feet Assistive device: Rolling walker (2 wheeled) Gait Pattern/deviations: Decreased step length - right;Decreased step length - left;Shuffle;Trunk flexed;Step-to pattern;Step-through pattern Gait velocity: decr   General Gait Details: cues for sequence, posture and position from [employer] Mobility    Modified Rankin (Stroke Patients Only)        Balance Overall balance assessment: Needs assistance Sitting-balance support: No upper extremity supported;Feet supported Sitting balance-Leahy Scale: Good     Standing balance support: Bilateral upper extremity supported Standing balance-Leahy Scale: Poor                              Cognition Arousal/Alertness: Awake/alert Behavior During Therapy: WFL for tasks assessed/performed;Impulsive Overall Cognitive Status: Within Functional Limits for tasks assessed                                        Exercises      General Comments        Pertinent Vitals/Pain Pain Assessment: 0-10 Pain Score: 2  Pain Location: R hip with activity Pain Descriptors / Indicators: Aching;Sore Pain Intervention(s): Limited activity within patient's tolerance;Monitored during session;Premedicated before session    Home Living                      Prior Function            PT Goals (current goals can now be found in the care plan section) Acute Rehab PT Goals Patient Stated Goal: Regain IND PT Goal Formulation: With patient Time For Goal Achievement: 05/08/21 Potential to Achieve Goals: Good Progress towards PT goals: Progressing toward goals    Frequency    7X/week  PT Plan Current plan remains appropriate    Co-evaluation              AM-PAC PT "6 Clicks" Mobility   Outcome Measure  Help needed turning from your back to your side while in a flat bed without using bedrails?: A Little Help needed moving from lying on your back to sitting on the side of a flat bed without using bedrails?: A Little Help needed moving to and from a bed to a chair (including a wheelchair)?: A Little Help needed standing up from a chair using your arms (e.g., wheelchair or bedside chair)?: A Little Help needed to walk in hospital room?: A Little Help needed climbing 3-5 steps with a railing? : A Lot 6 Click Score: 17    End of Session Equipment  Utilized During Treatment: Gait belt Activity Tolerance: Patient tolerated treatment well Patient left: in bed;with call bell/phone within reach;with bed alarm set Nurse Communication: Mobility status PT Visit Diagnosis: Unsteadiness on feet (R26.81);Difficulty in walking, not elsewhere classified (R26.2)     Time: [id number] PT Time Calculation (min) (ACUTE ONLY): 30 min  Charges:  $Gait Training: 23-37 mins                     [place] Pager [phone] Office [phone]    Sharona Rovner 04/24/2021, 3:28 PM

## 2021-04-24 NOTE — Plan of Care (Signed)
  Problem: Activity: Goal: Risk for activity intolerance will decrease Outcome: Progressing   Problem: Nutrition: Goal: Adequate nutrition will be maintained Outcome: Progressing   Problem: Coping: Goal: Level of anxiety will decrease Outcome: Progressing   Problem: Pain Managment: Goal: General experience of comfort will improve Outcome: Progressing   Problem: Safety: Goal: Ability to remain free from injury will improve Outcome: Progressing   

## 2021-04-24 NOTE — Progress Notes (Signed)
Pt stable Pain ok Ll equal Df ok right Stairs with PT today anticipate dc tomorrow

## 2021-04-24 NOTE — Op Note (Signed)
NAMEJACOREY, SPACE MEDICAL RECORD NO: [id number] ACCOUNT NO: [id number] DATE OF BIRTH: 05/10/1946 FACILITY: [facility]ATION: [facility] PHYSICIAN: Yetta Barre. Marlou Sa, MD  Operative Report   PREOPERATIVE DIAGNOSIS:  Right hip fracture.  POSTOPERATIVE DIAGNOSIS:  Right hip fracture.  PROCEDURE:  Right total hip replacement, anterior approach, using Pinnacle Gription acetabular shell 52 mm outer diameter with 1 cancellous screw and polyethylene liner, 52 neutral offset +4 liner with femoral stem size 5 high collar cementless with  femoral head metal +1.5 mm.  SURGEON:  Meredith Pel, MD  ASSISTANT:  Annie Main, PA  INDICATIONS:  The patient is a 75 year old patient with right hip fracture, who presents for operative management after explanation of risks and benefits.  DESCRIPTION OF PROCEDURE:  The patient was brought to the operating room where spinal anesthetic was induced.  Preoperative antibiotics were administered.  Timeout was called.  Right hip was prescrubbed with alcohol and Betadine, allowed to air dry,  prepped with DuraPrep solution, and draped in a sterile manner.  The patient was placed on the Hana bed with appropriate traction on the right lower extremity.  Charlie Pitter was used to cover the operative field.  Timeout was called.  Incision was made about 2  cm posterior and distal to the anterior superior iliac crest, taken over the mid portion of the tensor fascia lata.  Skin and subcutaneous tissue were sharply divided.  Care was taken to avoid injury to sensory lateral femoral cutaneous nerves in the  field.  The tensor fascia lata fascia was then incised along the length of the incision up to the anterior superior iliac crest.  The Allis retractor was used to lift up that fascia on the anterior portion and a plane was developed between the rectus and  the tensor fascia lata.  The crossing circumflex vessels were cauterized.  Cobra retractors were placed on the superior and inferior  aspect of the femoral neck.  Comminution of the femoral neck made this somewhat difficult.  The capsule was then incised  in a T-shape fashion and marked with a #1 Ethibond suture.  Femoral neck cut was then made.  Fluoroscopic guidance confirmed adequacy of the resection.  Trochanteric comminution was present.  Following this, the bent 90 retractor was placed.  Pulvinar  was removed from the acetabular socket.  The socket was then reamed up to a 51 under fluoroscopic guidance in 40 degrees of abduction and 10 degrees of anteversion.  Good bone was encountered.  Good press fit was obtained and one screw was placed.  +4  liner was placed.  Next, the femur was externally rotated.  Femoral lift was placed before externally rotating and the leg was abducted and extended.  With good exposure on the proximal femur, broaching was performed up to a size 5.  The size 5 trial was  placed and calcar reaming was performed.  +1.5 ball was placed.  The patient had excellent stability with flexion and internal rotation along with external rotation of 50 degrees with extension to 60 degrees.  Trial components were removed.  True  components were placed with same stability parameters maintained.  Fluoroscopy demonstrated equal leg lengths and good fill of the femur.  Thorough irrigation was then performed.  Capsule was closed after irrigating with IrriSept, which was utilized  after the arthrotomy and multiple times during the case.  Vancomycin powder was placed both within the femoral canal as well as around the prosthesis.  Capsule was closed using #1 Vicryl suture followed  by closing the dead space between the tensor fascia  lata and the rectus.  The fascia was then closed using #1 Vicryl suture followed by interrupted inverted 0 Vicryl suture, 2-0 Vicryl suture, and 3-0 Monocryl.  Steri-Strips and Aquacel dressing applied.  The patient tolerated the procedure well without  immediate complication.  Exparel was placed  around the incision prior to final closure.  The patient tolerated the procedure well without immediate complication.  Luke's assistance was required at all times during the case for retraction, opening, closing, mobilization of tissue.  His assistance was a medical necessity.   [facility] D: 04/23/2021 10:50:36 am T: 04/23/2021 11:28:00 am  JOB: [id number]

## 2021-04-24 NOTE — Progress Notes (Signed)
PROGRESS NOTE    Tray Emanuel  [id number] DOB: 1946-06-12 DOA: 04/22/2021 PCP: Tammi Sou, MD    Brief Narrative:  75 year old gentleman with history of type 2 diabetes, hypertension, CKD stage IIIa with baseline creatinine 1.2-1.4 brought to the ER with right hip pain after fall at home.  In the emergency room, hemodynamically stable.  Found to have right femoral neck fracture.  Admitted for surgery.   Assessment & Plan:   Principal Problem:   Closed right hip fracture, initial encounter ([place]) Active Problems:   HTN (hypertension), benign   Tobacco dependence   Chronic renal insufficiency, stage III (moderate) (HCC)   Type 2 diabetes mellitus with stage 3a chronic kidney disease, without long-term current use of insulin (HCC)  Closed traumatic right hip fracture: Status post right total hip Dr. Marlou Sa 7/24 Weightbearing as tolerated, DVT prophylaxis with aspirin 81 mg twice a day for 4 weeks Start working with physical therapy.  Recommended home with home health PT. Adequate pain medications with IV and oral opiates along with laxatives. Further surgical management as per surgery.  Type 2 diabetes on Actos at home, well controlled: Remains on insulin sliding scale perioperative.    Essential hypertension: Blood pressure stable on home medications.  Continued.  Chronic kidney disease stage IIIa: At about baseline.  Hypokalemia: Replaced and improved.    DVT prophylaxis: SCDs Start: 04/23/21 1254 Place TED hose Start: 04/23/21 1254 SCDs Start: 04/23/21 0119 Place and maintain sequential compression device Start: 04/22/21 2349   Code Status: Full code Family Communication: None today. Disposition Plan: Status is: Inpatient  Remains inpatient appropriate because:Inpatient level of care appropriate due to severity of illness  Dispo: The patient is from: Home              Anticipated d/c is to: Home with home health PT.              Patient currently is not  medically stable to d/c.   Difficult to place patient No         Consultants:  Orthopedics  Procedures:  ORIF with right total hip 7/24  Antimicrobials:  Perioperative antibiotics Ancef   Subjective: Patient seen and examined.  Sitting in couch after working with physical therapy.  Eager to go home. He was very happy with outcome of surgery and pain relief.  Objective: Vitals:   04/23/21 1458 04/23/21 2135 04/24/21 0131 04/24/21 0607  BP: 131/78 (!) 145/82 (!) 141/75 (!) 142/70  Pulse: 63 89 74 66  Resp: '18 16 18 16  '$ Temp:  98.4 F (36.9 C) 98.6 F (37 C) 98.4 F (36.9 C)  TempSrc:  Oral Oral Oral  SpO2: 99% 100% 98% 99%  Weight:      Height:        Intake/Output Summary (Last 24 hours) at 04/24/2021 1255 Last data filed at 04/24/2021 1214 Gross per 24 hour  Intake 904.59 ml  Output 1225 ml  Net -320.41 ml   Filed Weights   04/22/21 2032  Weight: 74.8 kg    Examination:  General: Looks comfortable.  Sitting in couch.  On room air. Cardiovascular: S1-S2 normal. Respiratory: Bilateral clear.  No added sounds. Gastrointestinal: Soft and nontender.  Bowel sounds present. Ext: No swelling or edema.  Right-sided thigh incision clean and dry.        Data Reviewed: I have personally reviewed following labs and imaging studies  CBC: Recent Labs  Lab 04/23/21 0010 04/23/21 0440 04/24/21 0302  WBC 11.2* 8.5  13.3*  HGB 14.7 14.3 12.6*  HCT 42.9 41.8 37.6*  MCV 88.3 88.7 90.0  PLT 234 209 [id number]   Basic Metabolic Panel: Recent Labs  Lab 04/23/21 0010 04/23/21 0440 04/24/21 0302  NA 132* 133* 131*  K 3.6 3.4* 4.1  CL 96* 97* 97*  CO2 '24 24 26  '$ GLUCOSE 143* 125* 153*  BUN '18 17 20  '$ CREATININE 1.49* 1.27* 1.41*  CALCIUM 9.3 9.3 8.8*  MG  --   --  2.1  PHOS  --   --  3.7   GFR: Estimated Creatinine Clearance: 42.1 mL/min (A) (by C-G formula based on SCr of 1.41 mg/dL (H)). Liver Function Tests: Recent Labs  Lab 04/23/21 0010  AST 18   ALT 11  ALKPHOS 87  BILITOT 0.7  PROT 7.1  ALBUMIN 4.2   No results for input(s): LIPASE, AMYLASE in the last 168 hours. No results for input(s): AMMONIA in the last 168 hours. Coagulation Profile: Recent Labs  Lab 04/23/21 0119  INR 1.1   Cardiac Enzymes: No results for input(s): CKTOTAL, CKMB, CKMBINDEX, TROPONINI in the last 168 hours. BNP (last 3 results) No results for input(s): PROBNP in the last 8760 hours. HbA1C: No results for input(s): HGBA1C in the last 72 hours. CBG: Recent Labs  Lab 04/23/21 1053 04/23/21 1616 04/23/21 2136 04/24/21 0729 04/24/21 1208  GLUCAP 135* 166* 140* 133* 157*   Lipid Profile: No results for input(s): CHOL, HDL, LDLCALC, TRIG, CHOLHDL, LDLDIRECT in the last 72 hours. Thyroid Function Tests: No results for input(s): TSH, T4TOTAL, FREET4, T3FREE, THYROIDAB in the last 72 hours. Anemia Panel: No results for input(s): VITAMINB12, FOLATE, FERRITIN, TIBC, IRON, RETICCTPCT in the last 72 hours. Sepsis Labs: No results for input(s): PROCALCITON, LATICACIDVEN in the last 168 hours.  Recent Results (from the past 240 hour(s))  Resp Panel by RT-PCR (Flu A&B, Covid) Nasopharyngeal Swab     Status: None   Collection Time: 04/23/21 12:15 AM   Specimen: Nasopharyngeal Swab; Nasopharyngeal(NP) swabs in vial transport medium  Result Value Ref Range Status   SARS Coronavirus 2 by RT PCR NEGATIVE NEGATIVE Final    Comment: (NOTE) SARS-CoV-2 target nucleic acids are NOT DETECTED.  The SARS-CoV-2 RNA is generally detectable in upper respiratory specimens during the acute phase of infection. The lowest concentration of SARS-CoV-2 viral copies this assay can detect is 138 copies/mL. A negative result does not preclude SARS-Cov-2 infection and should not be used as the sole basis for treatment or other patient management decisions. A negative result may occur with  improper specimen collection/handling, submission of specimen other than  nasopharyngeal swab, presence of viral mutation(s) within the areas targeted by this assay, and inadequate number of viral copies(<138 copies/mL). A negative result must be combined with clinical observations, patient history, and epidemiological information. The expected result is Negative.  Fact Sheet for Patients:  EntrepreneurPulse.com.au  Fact Sheet for Healthcare Providers:  IncredibleEmployment.be  This test is no t yet approved or cleared by the [place] FDA and  has been authorized for detection and/or diagnosis of SARS-CoV-2 by FDA under an Emergency Use Authorization (EUA). This EUA will remain  in effect (meaning this test can be used) for the duration of the COVID-19 declaration under Section 564(b)(1) of the Act, 21 U.S.C.section 360bbb-3(b)(1), unless the authorization is terminated  or revoked sooner.       Influenza A by PCR NEGATIVE NEGATIVE Final   Influenza B by PCR NEGATIVE NEGATIVE Final    Comment: (NOTE)  The Xpert Xpress SARS-CoV-2/FLU/RSV plus assay is intended as an aid in the diagnosis of influenza from Nasopharyngeal swab specimens and should not be used as a sole basis for treatment. Nasal washings and aspirates are unacceptable for Xpert Xpress SARS-CoV-2/FLU/RSV testing.  Fact Sheet for Patients: EntrepreneurPulse.com.au  Fact Sheet for Healthcare Providers: IncredibleEmployment.be  This test is not yet approved or cleared by the [place] FDA and has been authorized for detection and/or diagnosis of SARS-CoV-2 by FDA under an Emergency Use Authorization (EUA). This EUA will remain in effect (meaning this test can be used) for the duration of the COVID-19 declaration under Section 564(b)(1) of the Act, 21 U.S.C. section 360bbb-3(b)(1), unless the authorization is terminated or revoked.  Performed at [facility], [place] [address], [place], [place] [address]          Radiology Studies: CT HEAD WO CONTRAST  Result Date: 04/22/2021 CLINICAL DATA:  Status post fall. EXAM: CT HEAD WITHOUT CONTRAST TECHNIQUE: Contiguous axial images were obtained from the base of the skull through the vertex without intravenous contrast. COMPARISON:  August 09, 2010 FINDINGS: Brain: There is mild cerebral atrophy with widening of the extra-axial spaces and ventricular dilatation. There are areas of decreased attenuation within the white matter tracts of the supratentorial brain, consistent with microvascular disease changes. This is most prominent within the anterior aspect of the right parietal region and left occipital lobe and is increased in severity when compared to the prior exam. A small, chronic right basal ganglia lacunar infarct is seen. Vascular: No hyperdense vessel or unexpected calcification. Skull: Normal. Negative for fracture or focal lesion. Sinuses/Orbits: No acute finding. Other: None. IMPRESSION: 1. Generalized cerebral atrophy and chronic microvascular white matter disease. 2. No acute intracranial abnormality. Electronically Signed   By: Virgina Norfolk M.D.   On: 04/22/2021 22:22   CT CERVICAL SPINE WO CONTRAST  Result Date: 04/22/2021 CLINICAL DATA:  Status post fall. EXAM: CT CERVICAL SPINE WITHOUT CONTRAST TECHNIQUE: Multidetector CT imaging of the cervical spine was performed without intravenous contrast. Multiplanar CT image reconstructions were also generated. COMPARISON:  None. FINDINGS: Alignment: Normal. Skull base and vertebrae: No acute fracture. Chronic changes are seen involving the body and tip of the dens. Soft tissues and spinal canal: No prevertebral fluid or swelling. No visible canal hematoma. Disc levels: Mild to moderate severity multilevel endplate sclerosis and osteophyte formation is seen. This is most prominent at the level of C6-C7. There is marked severity narrowing of the anterior atlantoaxial  articulation. Mild multilevel intervertebral disc space narrowing is noted, most prominent at the level of C4-C5. Bilateral marked severity multilevel facet joint hypertrophy is noted. Upper chest: Negative. Other: None. IMPRESSION: Multilevel degenerative changes without an acute cervical spine fracture. Electronically Signed   By: Virgina Norfolk M.D.   On: 04/22/2021 22:28   Pelvis Portable  Result Date: 04/23/2021 CLINICAL DATA:  History of right femoral neck fracture. EXAM: PORTABLE PELVIS 1-2 VIEWS COMPARISON:  Right hip radiographs dated 04/22/2021. FINDINGS: Intraoperative fluoroscopy images demonstrate postoperative appearance of a total right hip arthroplasty. No evidence of hardware failure or malalignment. IMPRESSION: Postoperative appearance of total right hip arthroplasty. Electronically Signed   By: Zerita Boers M.D.   On: 04/23/2021 11:47   DG Pelvis Portable  Result Date: 04/22/2021 CLINICAL DATA:  Status post fall. EXAM: PORTABLE PELVIS 1-2 VIEWS COMPARISON:  None. FINDINGS: An acute fracture deformity is seen extending through the neck of the proximal right femur. Mild lateral displacement of the  distal fracture site is noted. There is no evidence of dislocation. A 14 mm x 8 mm well-defined, benign-appearing lucency is seen within the medullary region of the proximal right femoral shaft. Soft tissue structures are remarkable for mild to moderate severity vascular calcification. IMPRESSION: Acute fracture of the proximal right femur. Electronically Signed   By: Virgina Norfolk M.D.   On: 04/22/2021 22:15   Chest Portable 1 View  Result Date: 04/23/2021 CLINICAL DATA:  Preoperative chest x-ray for right hip fracture EXAM: PORTABLE CHEST 1 VIEW COMPARISON:  None. FINDINGS: No consolidation, features of edema, pneumothorax, or effusion. Pulmonary vascularity is normally distributed. The cardiomediastinal contours are unremarkable. No acute osseous or soft tissue abnormality.  IMPRESSION: No acute cardiopulmonary abnormality. Electronically Signed   By: Lovena Le M.D.   On: 04/23/2021 02:14   DG C-Arm 1-60 Min-No Report  Result Date: 04/23/2021 Fluoroscopy was utilized by the requesting physician.  No radiographic interpretation.   DG HIP OPERATIVE UNILAT WITH PELVIS RIGHT  Result Date: 04/23/2021 CLINICAL DATA:  History of right hip fracture. EXAM: OPERATIVE RIGHT HIP (WITH PELVIS IF PERFORMED)  VIEWS TECHNIQUE: Fluoroscopic spot image(s) were submitted for interpretation post-operatively. COMPARISON:  Right hip radiographs dated 04/22/2021. FINDINGS: Intraoperative fluoroscopy images demonstrate postoperative appearance of a total right hip arthroplasty. No evidence of hardware failure or malalignment. IMPRESSION: Postoperative appearance of total right hip arthroplasty. Electronically Signed   By: Zerita Boers M.D.   On: 04/23/2021 11:46   DG FEMUR, MIN 2 VIEWS RIGHT  Result Date: 04/22/2021 CLINICAL DATA:  Status post fall. EXAM: RIGHT FEMUR 2 VIEWS COMPARISON:  None. FINDINGS: An acute fracture deformity is seen extending through the neck of the proximal right femur. Mild lateral displacement of the distal fracture site is noted. There is no evidence of dislocation. A 14 mm x 8 mm well-defined, benign-appearing lucency is seen within the medullary region of the proximal right femoral shaft. Soft tissue structures are remarkable for mild to moderate severity vascular calcification. IMPRESSION: Acute fracture of the proximal right femur. Electronically Signed   By: Virgina Norfolk M.D.   On: 04/22/2021 22:17        Scheduled Meds:  aspirin  81 mg Oral BID   carvedilol  6.25 mg Oral BID WC   docusate sodium  100 mg Oral BID   insulin aspart  0-9 Units Subcutaneous TID WC   ipratropium  1 spray Each Nare BID   irbesartan  75 mg Oral Daily   melatonin  5 mg Oral QHS   multivitamin with minerals  1 tablet Oral Daily   pantoprazole  40 mg Oral Daily    potassium chloride  20 mEq Oral BID   Ensure Max Protein  11 oz Oral Daily   Continuous Infusions:  sodium chloride 500 mL (04/23/21 1619)   lactated ringers     methocarbamol (ROBAXIN) IV       LOS: 2 days    Time spent: 25 minutes    Barb Merino, MD [facility] Pager [phone]

## 2021-04-24 NOTE — Evaluation (Signed)
Physical Therapy Evaluation Patient Details Name: Antonio Serrano MRN: [id number] DOB: 02-Apr-1946 Today's Date: 04/24/2021   History of Present Illness  Pt s/p fall with R hip fx and now s/p R THR by anterior direct approach.  Pt with hx of DM  Clinical Impression  Pt s/p R THR and presents with decreased R LE strength/ROM, post op pain and questionable safety awareness limiting functional mobility.  Pt should progress to dc home with family assist.    Follow Up Recommendations Home health PT    Equipment Recommendations  None recommended by PT    Recommendations for Other Services       Precautions / Restrictions Precautions Precautions: Fall Restrictions Weight Bearing Restrictions: No RLE Weight Bearing: Weight bearing as tolerated      Mobility  Bed Mobility Overal bed mobility: Needs Assistance Bed Mobility: Supine to Sit     Supine to sit: Min assist     General bed mobility comments: Increased time with cues for sequence and use of L LE to self assist.  Physical assist to manage R LE and to bring trunk to upright    Transfers Overall transfer level: Needs assistance Equipment used: Rolling walker (2 wheeled) Transfers: Sit to/from Stand Sit to Stand: Min assist         General transfer comment: cues for LE management and use of UEs to self assist.  Physical assist to bring wt up and fwd and to balance in initial standing  Ambulation/Gait Ambulation/Gait assistance: Min assist Gait Distance (Feet): 75 Feet Assistive device: Rolling walker (2 wheeled) Gait Pattern/deviations: Step-to pattern;Decreased step length - right;Decreased step length - left;Shuffle;Trunk flexed Gait velocity: decr   General Gait Details: cues for sequence, posture and position from [employer]            Wheelchair Mobility    Modified Rankin (Stroke Patients Only)       Balance Overall balance assessment: Needs assistance Sitting-balance support: No upper  extremity supported;Feet supported Sitting balance-Leahy Scale: Good     Standing balance support: Bilateral upper extremity supported Standing balance-Leahy Scale: Fair                               Pertinent Vitals/Pain Pain Assessment: 0-10 Pain Score: 3  Pain Location: R hip with activity Pain Descriptors / Indicators: Aching;Sore Pain Intervention(s): Limited activity within patient's tolerance;Monitored during session;Premedicated before session    [place] expects to be discharged to:: Private residence Living Arrangements: Spouse/significant other Available Help at Discharge: Family Type of Home: House Home Access: Stairs to enter Entrance Stairs-Rails: Psychiatric nurse of Steps: [place]: One level Home Equipment: Environmental consultant - 2 wheels;Walker - 4 wheels;Cane - single point;Bedside commode      Prior Function Level of Independence: Independent;Independent with assistive device(s)         Comments: used cane as needed "when my balance felt off"     Hand Dominance        Extremity/Trunk Assessment   Upper Extremity Assessment Upper Extremity Assessment: Overall WFL for tasks assessed    Lower Extremity Assessment Lower Extremity Assessment: RLE deficits/detail RLE Deficits / Details: strength at hip to 2+/5 with AAROM at hip to 90 flex and 15 abd    Cervical / Trunk Assessment Cervical / Trunk Assessment: Normal  Communication   Communication: No difficulties  Cognition Arousal/Alertness: Awake/alert Behavior During Therapy: WFL for tasks assessed/performed;Impulsive Overall Cognitive  Status: Within Functional Limits for tasks assessed                                        General Comments      Exercises Total Joint Exercises Ankle Circles/Pumps: AROM;Both;15 reps;Supine Quad Sets: AROM;Both;10 reps;Supine Heel Slides: AAROM;Right;20 reps;Supine Hip ABduction/ADduction:  AAROM;Right;15 reps;Supine   Assessment/Plan    PT Assessment Patient needs continued PT services  PT Problem List Decreased strength;Decreased range of motion;Decreased activity tolerance;Decreased balance;Decreased mobility;Decreased knowledge of use of DME;Pain;Decreased safety awareness       PT Treatment Interventions DME instruction;Gait training;Stair training;Functional mobility training;Therapeutic activities;Therapeutic exercise;Patient/family education;Balance training    PT Goals (Current goals can be found in the Care Plan section)  Acute Rehab PT Goals Patient Stated Goal: Regain IND PT Goal Formulation: With patient Time For Goal Achievement: 05/08/21 Potential to Achieve Goals: Good    Frequency 7X/week   Barriers to discharge        Co-evaluation               AM-PAC PT "6 Clicks" Mobility  Outcome Measure Help needed turning from your back to your side while in a flat bed without using bedrails?: A Little Help needed moving from lying on your back to sitting on the side of a flat bed without using bedrails?: A Little Help needed moving to and from a bed to a chair (including a wheelchair)?: A Little Help needed standing up from a chair using your arms (e.g., wheelchair or bedside chair)?: A Little Help needed to walk in hospital room?: A Little Help needed climbing 3-5 steps with a railing? : A Lot 6 Click Score: 17    End of Session Equipment Utilized During Treatment: Gait belt Activity Tolerance: Patient tolerated treatment well Patient left: in chair;with call bell/phone within reach;with chair alarm set Nurse Communication: Mobility status PT Visit Diagnosis: Unsteadiness on feet (R26.81);Difficulty in walking, not elsewhere classified (R26.2)    Time: [id number] PT Time Calculation (min) (ACUTE ONLY): 43 min   Charges:   PT Evaluation $PT Eval Low Complexity: 1 Low PT Treatments $Gait Training: 8-22 mins $Therapeutic Exercise: 8-22  mins        Debe Coder PT Acute Rehabilitation Services Pager [phone] Office [phone]   Caitlain Tweed 04/24/2021, 11:22 AM

## 2021-04-25 DIAGNOSIS — S72001A Fracture of unspecified part of neck of right femur, initial encounter for closed fracture: Secondary | ICD-10-CM | POA: Diagnosis not present

## 2021-04-25 LAB — GLUCOSE, CAPILLARY: Glucose-Capillary: 114 mg/dL — ABNORMAL HIGH (ref 70–99)

## 2021-04-25 LAB — CBC
HCT: 34.3 % — ABNORMAL LOW (ref 39.0–52.0)
Hemoglobin: 11.7 g/dL — ABNORMAL LOW (ref 13.0–17.0)
MCH: 30.4 pg (ref 26.0–34.0)
MCHC: 34.1 g/dL (ref 30.0–36.0)
MCV: 89.1 fL (ref 80.0–100.0)
Platelets: 195 10*3/uL (ref 150–400)
RBC: 3.85 MIL/uL — ABNORMAL LOW (ref 4.22–5.81)
RDW: 14.5 % (ref 11.5–15.5)
WBC: 10.2 10*3/uL (ref 4.0–10.5)
nRBC: 0 % (ref 0.0–0.2)

## 2021-04-25 MED ORDER — ASPIRIN EC 81 MG PO TBEC: 81.0000 mg | DELAYED_RELEASE_TABLET | Freq: Two times a day (BID) | ORAL | 0 refills | Status: AC

## 2021-04-25 MED ORDER — ESOMEPRAZOLE MAGNESIUM 20 MG PO CPDR: 20.0000 mg | DELAYED_RELEASE_CAPSULE | Freq: Every day | ORAL | 2 refills | Status: DC

## 2021-04-25 MED ORDER — OXYCODONE HCL 5 MG PO TABS: 5.0000 mg | ORAL_TABLET | Freq: Four times a day (QID) | ORAL | 0 refills | Status: AC | PRN

## 2021-04-25 MED ORDER — ACETAMINOPHEN 500 MG PO TABS: 500.0000 mg | ORAL_TABLET | Freq: Four times a day (QID) | ORAL | Status: DC | PRN

## 2021-04-25 MED ORDER — SENNOSIDES-DOCUSATE SODIUM 8.6-50 MG PO TABS: 1.0000 | ORAL_TABLET | Freq: Every evening | ORAL | 0 refills | Status: AC | PRN

## 2021-04-25 NOTE — Plan of Care (Signed)

## 2021-04-25 NOTE — TOC Transition Note (Signed)
Transition of Care [facility]) - CM/SW Discharge Note   Patient Details  Name: Antonio Serrano MRN: [id number] Date of Birth: 1945/10/27  Transition of Care [facility] CM/SW Contact:  Lennart Pall, LCSW Phone Number: 04/25/2021, 10:59 AM   Clinical Narrative:    Met with pt and daughter and confirmed pt has all needed DME at home.  PT has recommended HHPT - pt agreeable - no agency preference and referral placed with [facility].  No further TOC needs.   Final next level of care: [place] Barriers to Discharge: Barriers Resolved   Patient Goals and CMS Choice Patient states their goals for this hospitalization and ongoing recovery are:: return home      Discharge Placement                       Discharge Plan and Services                DME Arranged: N/A DME Agency: NA       HH Arranged: PT HH Agency: [facility] Date [place]: 04/25/21 Time [place]: 1059 Representative spoke with at [place]: [place] ([place]) Interventions     Readmission Risk Interventions No flowsheet data found.

## 2021-04-25 NOTE — Discharge Summary (Signed)
Physician Discharge Summary  Antonio Serrano [id number] DOB: Mar 16, 1946 DOA: 04/22/2021  PCP: Tammi Sou, MD  Admit date: 04/22/2021 Discharge date: 04/25/2021  Admitted From: Home Disposition: Home with home health  Recommendations for Outpatient Follow-up:  Follow up with PCP in 1-2 weeks Please obtain BMP/CBC in one week Schedule follow-up with orthopedics in 2 weeks  Home Health: PT Equipment/Devices: Walker  Discharge Condition: Stable CODE STATUS: Full code Diet recommendation: Low-salt low-carb diet  Discharge summary:  75 year old gentleman with history of type 2 diabetes, hypertension, CKD stage IIIa with baseline creatinine 1.2-1.4 brought to the ER with right hip pain after fall at home.  In the emergency room, hemodynamically stable.  Found to have right femoral neck fracture.    Closed traumatic right hip fracture: Status post right total hip Dr. Marlou Sa 7/24 Weightbearing as tolerated, DVT prophylaxis with aspirin 81 mg twice a day for 4 weeks then resume his regular doses of aspirin. PT recommended home health PT.  Prescribed. Pain mostly managed with Tylenol, oxycodone before mobility. Take laxatives along with oxycodone. Surgical follow-up to be scheduled.  Medical issues including hypertension, kidney functions remained stable.  Resume Actos for diabetes on discharge.  Stable for discharge.  Discharge Diagnoses:  Principal Problem:   Closed right hip fracture, initial encounter ([place]) Active Problems:   HTN (hypertension), benign   Tobacco dependence   Chronic renal insufficiency, stage III (moderate) (HCC)   Type 2 diabetes mellitus with stage 3a chronic kidney disease, without long-term current use of insulin [facility])    Discharge Instructions  Discharge Instructions     Call MD for:  redness, tenderness, or signs of infection (pain, swelling, redness, odor or green/yellow discharge around incision site)   Complete by: As directed    Call MD for:   severe uncontrolled pain   Complete by: As directed    Call MD for:  temperature >100.4   Complete by: As directed    Diet - low sodium heart healthy   Complete by: As directed    Diet Carb Modified   Complete by: As directed    Discharge instructions   Complete by: As directed    You taking increased dose of aspirin as two times a day for 30 days and go back on once daily doses   Increase activity slowly   Complete by: As directed    Leave dressing on - Keep it clean, dry, and intact until clinic visit   Complete by: As directed       Allergies as of 04/25/2021       Reactions   Bactrim [sulfamethoxazole-trimethoprim] Nausea Only        Medication List     TAKE these medications    acetaminophen 500 MG tablet Commonly known as: TYLENOL Take 1 tablet (500 mg total) by mouth every 6 (six) hours as needed for mild pain or fever (pain score 1-3 or temp > 100.5).   aspirin EC 81 MG tablet Take 1 tablet (81 mg total) by mouth 2 (two) times daily. What changed: when to take this   carvedilol 6.25 MG tablet Commonly known as: COREG TAKE 1 TABLET BY MOUTH TWICE DAILY WITH A MEAL   esomeprazole 20 MG capsule Commonly known as: NEXIUM Take 1 capsule (20 mg total) by mouth daily at 12 noon.   furosemide 40 MG tablet Commonly known as: LASIX TAKE 1 TABLET BY MOUTH ONCE DAILY . APPOINTMENT REQUIRED FOR FUTURE REFILLS   ipratropium 0.03 % nasal spray  Commonly known as: ATROVENT USE 2 SPRAY(S) IN EACH NOSTRIL EVERY 12 HOURS   olmesartan 20 MG tablet Commonly known as: BENICAR Take 1/2 (one-half) tablet by mouth once daily   oxyCODONE 5 MG immediate release tablet Commonly known as: Oxy IR/ROXICODONE Take 1 tablet (5 mg total) by mouth every 6 (six) hours as needed for up to 5 days for moderate pain or breakthrough pain (pain score 4-6).   pioglitazone 15 MG tablet Commonly known as: ACTOS TAKE 1/2 TO 1 (ONE-HALF TO ONE) TABLET BY MOUTH ONCE DAILY   senna-docusate  8.6-50 MG tablet Commonly known as: Senokot-S Take 1 tablet by mouth at bedtime as needed for mild constipation.               Discharge Care Instructions  (From admission, onward)           Start     Ordered   04/25/21 0000  Leave dressing on - Keep it clean, dry, and intact until clinic visit        04/25/21 1031            Follow-up Information     Macarthur Critchley, OD. Schedule an appointment as soon as possible for a visit in 2 week(s).   Specialty: Optometry Contact information: [address] [place] [address] [phone]                Allergies  Allergen Reactions   Bactrim [Sulfamethoxazole-Trimethoprim] Nausea Only    Consultations: Orthopedics   Procedures/Studies: CT HEAD WO CONTRAST  Result Date: 04/22/2021 CLINICAL DATA:  Status post fall. EXAM: CT HEAD WITHOUT CONTRAST TECHNIQUE: Contiguous axial images were obtained from the base of the skull through the vertex without intravenous contrast. COMPARISON:  August 09, 2010 FINDINGS: Brain: There is mild cerebral atrophy with widening of the extra-axial spaces and ventricular dilatation. There are areas of decreased attenuation within the white matter tracts of the supratentorial brain, consistent with microvascular disease changes. This is most prominent within the anterior aspect of the right parietal region and left occipital lobe and is increased in severity when compared to the prior exam. A small, chronic right basal ganglia lacunar infarct is seen. Vascular: No hyperdense vessel or unexpected calcification. Skull: Normal. Negative for fracture or focal lesion. Sinuses/Orbits: No acute finding. Other: None. IMPRESSION: 1. Generalized cerebral atrophy and chronic microvascular white matter disease. 2. No acute intracranial abnormality. Electronically Signed   By: Virgina Norfolk M.D.   On: 04/22/2021 22:22   CT CERVICAL SPINE WO CONTRAST  Result Date: 04/22/2021 CLINICAL DATA:   Status post fall. EXAM: CT CERVICAL SPINE WITHOUT CONTRAST TECHNIQUE: Multidetector CT imaging of the cervical spine was performed without intravenous contrast. Multiplanar CT image reconstructions were also generated. COMPARISON:  None. FINDINGS: Alignment: Normal. Skull base and vertebrae: No acute fracture. Chronic changes are seen involving the body and tip of the dens. Soft tissues and spinal canal: No prevertebral fluid or swelling. No visible canal hematoma. Disc levels: Mild to moderate severity multilevel endplate sclerosis and osteophyte formation is seen. This is most prominent at the level of C6-C7. There is marked severity narrowing of the anterior atlantoaxial articulation. Mild multilevel intervertebral disc space narrowing is noted, most prominent at the level of C4-C5. Bilateral marked severity multilevel facet joint hypertrophy is noted. Upper chest: Negative. Other: None. IMPRESSION: Multilevel degenerative changes without an acute cervical spine fracture. Electronically Signed   By: Virgina Norfolk M.D.   On: 04/22/2021 22:28   Pelvis  Portable  Result Date: 04/23/2021 CLINICAL DATA:  History of right femoral neck fracture. EXAM: PORTABLE PELVIS 1-2 VIEWS COMPARISON:  Right hip radiographs dated 04/22/2021. FINDINGS: Intraoperative fluoroscopy images demonstrate postoperative appearance of a total right hip arthroplasty. No evidence of hardware failure or malalignment. IMPRESSION: Postoperative appearance of total right hip arthroplasty. Electronically Signed   By: Zerita Boers M.D.   On: 04/23/2021 11:47   DG Pelvis Portable  Result Date: 04/22/2021 CLINICAL DATA:  Status post fall. EXAM: PORTABLE PELVIS 1-2 VIEWS COMPARISON:  None. FINDINGS: An acute fracture deformity is seen extending through the neck of the proximal right femur. Mild lateral displacement of the distal fracture site is noted. There is no evidence of dislocation. A 14 mm x 8 mm well-defined, benign-appearing lucency  is seen within the medullary region of the proximal right femoral shaft. Soft tissue structures are remarkable for mild to moderate severity vascular calcification. IMPRESSION: Acute fracture of the proximal right femur. Electronically Signed   By: Virgina Norfolk M.D.   On: 04/22/2021 22:15   Chest Portable 1 View  Result Date: 04/23/2021 CLINICAL DATA:  Preoperative chest x-ray for right hip fracture EXAM: PORTABLE CHEST 1 VIEW COMPARISON:  None. FINDINGS: No consolidation, features of edema, pneumothorax, or effusion. Pulmonary vascularity is normally distributed. The cardiomediastinal contours are unremarkable. No acute osseous or soft tissue abnormality. IMPRESSION: No acute cardiopulmonary abnormality. Electronically Signed   By: Lovena Le M.D.   On: 04/23/2021 02:14   DG C-Arm 1-60 Min-No Report  Result Date: 04/23/2021 Fluoroscopy was utilized by the requesting physician.  No radiographic interpretation.   DG HIP OPERATIVE UNILAT WITH PELVIS RIGHT  Result Date: 04/23/2021 CLINICAL DATA:  History of right hip fracture. EXAM: OPERATIVE RIGHT HIP (WITH PELVIS IF PERFORMED)  VIEWS TECHNIQUE: Fluoroscopic spot image(s) were submitted for interpretation post-operatively. COMPARISON:  Right hip radiographs dated 04/22/2021. FINDINGS: Intraoperative fluoroscopy images demonstrate postoperative appearance of a total right hip arthroplasty. No evidence of hardware failure or malalignment. IMPRESSION: Postoperative appearance of total right hip arthroplasty. Electronically Signed   By: Zerita Boers M.D.   On: 04/23/2021 11:46   DG FEMUR, MIN 2 VIEWS RIGHT  Result Date: 04/22/2021 CLINICAL DATA:  Status post fall. EXAM: RIGHT FEMUR 2 VIEWS COMPARISON:  None. FINDINGS: An acute fracture deformity is seen extending through the neck of the proximal right femur. Mild lateral displacement of the distal fracture site is noted. There is no evidence of dislocation. A 14 mm x 8 mm well-defined,  benign-appearing lucency is seen within the medullary region of the proximal right femoral shaft. Soft tissue structures are remarkable for mild to moderate severity vascular calcification. IMPRESSION: Acute fracture of the proximal right femur. Electronically Signed   By: Virgina Norfolk M.D.   On: 04/22/2021 22:17   (Echo, Carotid, EGD, Colonoscopy, ERCP)    Subjective: Patient seen and examined.  No overnight events.  Eager to go home.   Discharge Exam: Vitals:   04/25/21 0630 04/25/21 0901  BP: (!) 144/65 136/78  Pulse: 66 88  Resp: 17 17  Temp: 98 F (36.7 C) 98.2 F (36.8 C)  SpO2: 96% 95%   Vitals:   04/24/21 1340 04/24/21 2302 04/25/21 0630 04/25/21 0901  BP: 137/63 (!) 157/77 (!) 144/65 136/78  Pulse: 62 70 66 88  Resp: '18 16 17 17  '$ Temp: 97.8 F (36.6 C) 98 F (36.7 C) 98 F (36.7 C) 98.2 F (36.8 C)  TempSrc: Oral Oral Oral Oral  SpO2: 99% 96%  96% 95%  Weight:      Height:        General: Pt is alert, awake, not in acute distress Comfortably eating breakfast on room air. Cardiovascular: RRR, S1/S2 +, no rubs, no gallops Respiratory: CTA bilaterally, no wheezing, no rhonchi Abdominal: Soft, NT, ND, bowel sounds + Extremities: no edema, no cyanosis .  Surgical incision on the right thigh looks clean and dry.   The results of significant diagnostics from this hospitalization (including imaging, microbiology, ancillary and laboratory) are listed below for reference.     Microbiology: Recent Results (from the past 240 hour(s))  Resp Panel by RT-PCR (Flu A&B, Covid) Nasopharyngeal Swab     Status: None   Collection Time: 04/23/21 12:15 AM   Specimen: Nasopharyngeal Swab; Nasopharyngeal(NP) swabs in vial transport medium  Result Value Ref Range Status   SARS Coronavirus 2 by RT PCR NEGATIVE NEGATIVE Final    Comment: (NOTE) SARS-CoV-2 target nucleic acids are NOT DETECTED.  The SARS-CoV-2 RNA is generally detectable in upper respiratory specimens during  the acute phase of infection. The lowest concentration of SARS-CoV-2 viral copies this assay can detect is 138 copies/mL. A negative result does not preclude SARS-Cov-2 infection and should not be used as the sole basis for treatment or other patient management decisions. A negative result may occur with  improper specimen collection/handling, submission of specimen other than nasopharyngeal swab, presence of viral mutation(s) within the areas targeted by this assay, and inadequate number of viral copies(<138 copies/mL). A negative result must be combined with clinical observations, patient history, and epidemiological information. The expected result is Negative.  Fact Sheet for Patients:  EntrepreneurPulse.com.au  Fact Sheet for Healthcare Providers:  IncredibleEmployment.be  This test is no t yet approved or cleared by the [place] FDA and  has been authorized for detection and/or diagnosis of SARS-CoV-2 by FDA under an Emergency Use Authorization (EUA). This EUA will remain  in effect (meaning this test can be used) for the duration of the COVID-19 declaration under Section 564(b)(1) of the Act, 21 U.S.C.section 360bbb-3(b)(1), unless the authorization is terminated  or revoked sooner.       Influenza A by PCR NEGATIVE NEGATIVE Final   Influenza B by PCR NEGATIVE NEGATIVE Final    Comment: (NOTE) The Xpert Xpress SARS-CoV-2/FLU/RSV plus assay is intended as an aid in the diagnosis of influenza from Nasopharyngeal swab specimens and should not be used as a sole basis for treatment. Nasal washings and aspirates are unacceptable for Xpert Xpress SARS-CoV-2/FLU/RSV testing.  Fact Sheet for Patients: EntrepreneurPulse.com.au  Fact Sheet for Healthcare Providers: IncredibleEmployment.be  This test is not yet approved or cleared by the [place] FDA and has been authorized for detection and/or  diagnosis of SARS-CoV-2 by FDA under an Emergency Use Authorization (EUA). This EUA will remain in effect (meaning this test can be used) for the duration of the COVID-19 declaration under Section 564(b)(1) of the Act, 21 U.S.C. section 360bbb-3(b)(1), unless the authorization is terminated or revoked.  Performed at [facility], [place] [address], [place], [place] [address]      Labs: BNP (last 3 results) No results for input(s): BNP in the last 8760 hours. Basic Metabolic Panel: Recent Labs  Lab 04/23/21 0010 04/23/21 0440 04/24/21 0302  NA 132* 133* 131*  K 3.6 3.4* 4.1  CL 96* 97* 97*  CO2 '24 24 26  '$ GLUCOSE 143* 125* 153*  BUN '18 17 20  '$ CREATININE 1.49* 1.27* 1.41*  CALCIUM 9.3  9.3 8.8*  MG  --   --  2.1  PHOS  --   --  3.7   Liver Function Tests: Recent Labs  Lab 04/23/21 0010  AST 18  ALT 11  ALKPHOS 87  BILITOT 0.7  PROT 7.1  ALBUMIN 4.2   No results for input(s): LIPASE, AMYLASE in the last 168 hours. No results for input(s): AMMONIA in the last 168 hours. CBC: Recent Labs  Lab 04/23/21 0010 04/23/21 0440 04/24/21 0302 04/25/21 0341  WBC 11.2* 8.5 13.3* 10.2  HGB 14.7 14.3 12.6* 11.7*  HCT 42.9 41.8 37.6* 34.3*  MCV 88.3 88.7 90.0 89.1  PLT 234 209 209 195   Cardiac Enzymes: No results for input(s): CKTOTAL, CKMB, CKMBINDEX, TROPONINI in the last 168 hours. BNP: Invalid input(s): POCBNP CBG: Recent Labs  Lab 04/24/21 0729 04/24/21 1208 04/24/21 1638 04/24/21 2259 04/25/21 0746  GLUCAP 133* 157* 100* 124* 114*   D-Dimer No results for input(s): DDIMER in the last 72 hours. Hgb A1c No results for input(s): HGBA1C in the last 72 hours. Lipid Profile No results for input(s): CHOL, HDL, LDLCALC, TRIG, CHOLHDL, LDLDIRECT in the last 72 hours. Thyroid function studies No results for input(s): TSH, T4TOTAL, T3FREE, THYROIDAB in the last 72 hours.  Invalid input(s): FREET3 Anemia work up No results for input(s):  VITAMINB12, FOLATE, FERRITIN, TIBC, IRON, RETICCTPCT in the last 72 hours. Urinalysis    Component Value Date/Time   BILIRUBINUR Small 12/28/2015 1007   PROTEINUR '100mg'$ /dl 12/28/2015 1007   UROBILINOGEN 0.2 12/28/2015 1007   NITRITE Negative 12/28/2015 1007   LEUKOCYTESUR small (1+) (A) 12/28/2015 1007   Sepsis Labs Invalid input(s): PROCALCITONIN,  WBC,  LACTICIDVEN Microbiology Recent Results (from the past 240 hour(s))  Resp Panel by RT-PCR (Flu A&B, Covid) Nasopharyngeal Swab     Status: None   Collection Time: 04/23/21 12:15 AM   Specimen: Nasopharyngeal Swab; Nasopharyngeal(NP) swabs in vial transport medium  Result Value Ref Range Status   SARS Coronavirus 2 by RT PCR NEGATIVE NEGATIVE Final    Comment: (NOTE) SARS-CoV-2 target nucleic acids are NOT DETECTED.  The SARS-CoV-2 RNA is generally detectable in upper respiratory specimens during the acute phase of infection. The lowest concentration of SARS-CoV-2 viral copies this assay can detect is 138 copies/mL. A negative result does not preclude SARS-Cov-2 infection and should not be used as the sole basis for treatment or other patient management decisions. A negative result may occur with  improper specimen collection/handling, submission of specimen other than nasopharyngeal swab, presence of viral mutation(s) within the areas targeted by this assay, and inadequate number of viral copies(<138 copies/mL). A negative result must be combined with clinical observations, patient history, and epidemiological information. The expected result is Negative.  Fact Sheet for Patients:  EntrepreneurPulse.com.au  Fact Sheet for Healthcare Providers:  IncredibleEmployment.be  This test is no t yet approved or cleared by the [place] FDA and  has been authorized for detection and/or diagnosis of SARS-CoV-2 by FDA under an Emergency Use Authorization (EUA). This EUA will remain  in effect  (meaning this test can be used) for the duration of the COVID-19 declaration under Section 564(b)(1) of the Act, 21 U.S.C.section 360bbb-3(b)(1), unless the authorization is terminated  or revoked sooner.       Influenza A by PCR NEGATIVE NEGATIVE Final   Influenza B by PCR NEGATIVE NEGATIVE Final    Comment: (NOTE) The Xpert Xpress SARS-CoV-2/FLU/RSV plus assay is intended as an aid in the diagnosis of influenza  from Nasopharyngeal swab specimens and should not be used as a sole basis for treatment. Nasal washings and aspirates are unacceptable for Xpert Xpress SARS-CoV-2/FLU/RSV testing.  Fact Sheet for Patients: EntrepreneurPulse.com.au  Fact Sheet for Healthcare Providers: IncredibleEmployment.be  This test is not yet approved or cleared by the [place] FDA and has been authorized for detection and/or diagnosis of SARS-CoV-2 by FDA under an Emergency Use Authorization (EUA). This EUA will remain in effect (meaning this test can be used) for the duration of the COVID-19 declaration under Section 564(b)(1) of the Act, 21 U.S.C. section 360bbb-3(b)(1), unless the authorization is terminated or revoked.  Performed at [facility], [place] [address] [place], [place] [address]      Time coordinating discharge:  28 minutes  SIGNED:   Barb Merino, MD  [facility] 04/25/2021, 10:32 AM

## 2021-04-25 NOTE — Plan of Care (Signed)
  Problem: Nutrition: Goal: Adequate nutrition will be maintained Outcome: Progressing   Problem: Coping: Goal: Level of anxiety will decrease Outcome: Progressing   Problem: Pain Managment: Goal: General experience of comfort will improve Outcome: Progressing   Problem: Safety: Goal: Ability to remain free from injury will improve Outcome: Progressing   

## 2021-04-25 NOTE — Care Management Important Message (Signed)
Important Message  Patient Details IM Letter given to the Patient Name: Antonio Serrano MRN: [id number] Date of Birth: 1946/04/09   Medicare Important Message Given:  Yes     Kerin Salen 04/25/2021, 11:03 AM

## 2021-04-25 NOTE — Progress Notes (Signed)
Provided discharge education/instructions, all questions and concerns addressed, Pt not in distress, discharged home with belongings accompanied by daughter.

## 2021-04-25 NOTE — Progress Notes (Signed)
Physical Therapy Treatment Patient Details Name: Antonio Serrano MRN: [id number] DOB: [birth year] Today's Date: 04/25/2021    History of Present Illness Pt s/p fall with R hip fx and now s/p R THR by anterior direct approach.  Pt with hx of DM    PT Comments    Pt progressing well. Will benefit from HHPT. Pt feels ready to d/c home    Follow Up Recommendations  Home health PT;Supervision for mobility/OOB     Equipment Recommendations  None recommended by PT    Recommendations for Other Services       Precautions / Restrictions Precautions Precautions: Fall Restrictions Weight Bearing Restrictions: No RLE Weight Bearing: Weight bearing as tolerated    Mobility  Bed Mobility Overal bed mobility: Needs Assistance Bed Mobility: Supine to Sit     Supine to sit: Min guard     General bed mobility comments: cues for sequence and use of L LE to self assist    Transfers Overall transfer level: Needs assistance Equipment used: Rolling walker (2 wheeled) Transfers: Sit to/from Stand Sit to Stand: Min guard;Min assist         General transfer comment: cues for LE management and use of UEs to self assist. light  Physical assist to correct for posterior bias with initial standing-pt self cues correction as well  Ambulation/Gait Ambulation/Gait assistance: Min guard;Supervision Gait Distance (Feet): 50 Feet Assistive device: Rolling walker (2 wheeled) Gait Pattern/deviations: Step-to pattern;Decreased step length - right Gait velocity: decr   General Gait Details: cues for sequence, posture and position from RW   Stairs Stairs: Yes Stairs assistance: Min assist Stair Management: One rail Right;One rail Left;Step to pattern;Sideways Number of Stairs: 3 General stair comments: cues for sequence and technique   Wheelchair Mobility    Modified Rankin (Stroke Patients Only)       Balance     Sitting balance-Leahy Scale: Good       Standing balance-Leahy  Scale: Poor Standing balance comment: reliant on UEs                            Cognition Arousal/Alertness: Awake/alert Behavior During Therapy: WFL for tasks assessed/performed;Impulsive Overall Cognitive Status: Within Functional Limits for tasks assessed                                        Exercises Total Joint Exercises Heel Slides: AAROM;5 reps;Right    General Comments        Pertinent Vitals/Pain Pain Assessment: 0-10 Pain Score: 2  Pain Location: R hip with activity Pain Descriptors / Indicators: Aching;Sore Pain Intervention(s): Limited activity within patient's tolerance;Monitored during session;Repositioned    Home Living                      Prior Function            PT Goals (current goals can now be found in the care plan section) Acute Rehab PT Goals Patient Stated Goal: Regain IND PT Goal Formulation: With patient Time For Goal Achievement: 05/08/21 Potential to Achieve Goals: Good Progress towards PT goals: Progressing toward goals    Frequency    7X/week      PT Plan Current plan remains appropriate    Co-evaluation              AM-PAC PT "6 Clicks"  Mobility   Outcome Measure  Help needed turning from your back to your side while in a flat bed without using bedrails?: A Little Help needed moving from lying on your back to sitting on the side of a flat bed without using bedrails?: A Little Help needed moving to and from a bed to a chair (including a wheelchair)?: A Little Help needed standing up from a chair using your arms (e.g., wheelchair or bedside chair)?: A Little Help needed to walk in hospital room?: A Little Help needed climbing 3-5 steps with a railing? : A Little 6 Click Score: 18    End of Session Equipment Utilized During Treatment: Gait belt Activity Tolerance: Patient tolerated treatment well Patient left: in chair;with call bell/phone within reach;with chair alarm set    PT Visit Diagnosis: Unsteadiness on feet (R26.81);Difficulty in walking, not elsewhere classified (R26.2)     Time: [id number] PT Time Calculation (min) (ACUTE ONLY): 17 min  Charges:  $Gait Training: 8-22 mins                     Baxter Flattery, PT  Acute Rehab Dept ([place]) [phone] Pager [phone]  04/25/2021    [facility] 04/25/2021, 10:23 AM

## 2021-04-26 ENCOUNTER — Telehealth: Payer: Self-pay | Admitting: *Deleted

## 2021-04-26 NOTE — Telephone Encounter (Signed)
Transition Care Management Unsuccessful Follow-up Telephone Call  Date of discharge and from where:  [facility] -[phone]  Attempts:  1st Attempt  Reason for unsuccessful TCM follow-up call:  No answer/busy

## 2021-04-27 ENCOUNTER — Encounter: Payer: Self-pay | Admitting: Family Medicine

## 2021-04-27 DIAGNOSIS — N1831 Chronic kidney disease, stage 3a: Secondary | ICD-10-CM | POA: Diagnosis not present

## 2021-04-27 DIAGNOSIS — K5909 Other constipation: Secondary | ICD-10-CM | POA: Diagnosis not present

## 2021-04-27 DIAGNOSIS — Z9181 History of falling: Secondary | ICD-10-CM | POA: Diagnosis not present

## 2021-04-27 DIAGNOSIS — S72001D Fracture of unspecified part of neck of right femur, subsequent encounter for closed fracture with routine healing: Secondary | ICD-10-CM | POA: Diagnosis not present

## 2021-04-27 DIAGNOSIS — E785 Hyperlipidemia, unspecified: Secondary | ICD-10-CM | POA: Diagnosis not present

## 2021-04-27 DIAGNOSIS — E1122 Type 2 diabetes mellitus with diabetic chronic kidney disease: Secondary | ICD-10-CM | POA: Diagnosis not present

## 2021-04-27 DIAGNOSIS — F17210 Nicotine dependence, cigarettes, uncomplicated: Secondary | ICD-10-CM | POA: Diagnosis not present

## 2021-04-27 DIAGNOSIS — I129 Hypertensive chronic kidney disease with stage 1 through stage 4 chronic kidney disease, or unspecified chronic kidney disease: Secondary | ICD-10-CM | POA: Diagnosis not present

## 2021-04-27 DIAGNOSIS — N529 Male erectile dysfunction, unspecified: Secondary | ICD-10-CM | POA: Diagnosis not present

## 2021-04-27 DIAGNOSIS — Z96641 Presence of right artificial hip joint: Secondary | ICD-10-CM | POA: Diagnosis not present

## 2021-05-10 ENCOUNTER — Other Ambulatory Visit: Payer: Self-pay

## 2021-05-10 ENCOUNTER — Ambulatory Visit (INDEPENDENT_AMBULATORY_CARE_PROVIDER_SITE_OTHER): Payer: Medicare Other

## 2021-05-10 ENCOUNTER — Ambulatory Visit (INDEPENDENT_AMBULATORY_CARE_PROVIDER_SITE_OTHER): Payer: Medicare Other | Admitting: Orthopedic Surgery

## 2021-05-10 DIAGNOSIS — S72001A Fracture of unspecified part of neck of right femur, initial encounter for closed fracture: Secondary | ICD-10-CM

## 2021-05-12 ENCOUNTER — Telehealth: Payer: Self-pay | Admitting: Orthopedic Surgery

## 2021-05-12 NOTE — Telephone Encounter (Signed)
For 4 weeks after surgery pls clala thx

## 2021-05-12 NOTE — Telephone Encounter (Signed)
Please see message below. Patient is currently being treated for Closed fracture of neck of right femur

## 2021-05-12 NOTE — Telephone Encounter (Signed)
Patient called asked how long should he wear the compression hose? The number to contact patient is [phone]

## 2021-05-14 ENCOUNTER — Encounter: Payer: Self-pay | Admitting: Orthopedic Surgery

## 2021-05-14 NOTE — Progress Notes (Signed)
Post-Op Visit Note   Patient: Antonio Serrano           Date of Birth: 1946-08-13           MRN: [id number] Visit Date: 05/10/2021 PCP: Tammi Sou, MD   Assessment & Plan:  Chief Complaint:  Chief Complaint  Patient presents with   Right Hip - Routine Post Op    04/23/21 Right THA   Visit Diagnoses:  1. Closed fracture of neck of right femur, initial encounter [facility])     Plan: Antonio Serrano is a 75 year old patient underwent right total hip replacement 04/23/2021 for hip fracture.  He has been doing well.  Walking.  Doing exercises.  On exam he has good abduction and hip flexion strength.  Radiographs show good alignment and position of the prosthesis.  He has had mild displacement of his greater trochanter which does not look to be clinically significant at this time.  Plan is to month return with [facility].  No calf tenderness negative Homans today.  Follow-Up Instructions: Return in about 8 weeks (around 07/05/2021).   Orders:  Orders Placed This Encounter  Procedures   XR HIP UNILAT W OR W/O PELVIS 2-3 VIEWS RIGHT   No orders of the defined types were placed in this encounter.   Imaging: No results found.  PMFS History: Patient Active Problem List   Diagnosis Date Noted   Closed right hip fracture, initial encounter ([place]) 04/23/2021   Diabetes mellitus with complication ([place]) [id number]   Hyperlipidemia 07/29/2015   Conjunctivitis 06/27/2015   Healthcare maintenance 06/27/2015   Type 2 diabetes mellitus with stage 3a chronic kidney disease, without long-term current use of insulin ([place]) 07/16/2014   Constipation, chronic 07/16/2014   Preventative health care 05/18/2014   Testicular pain, right 06/25/2013   Erectile dysfunction 05/31/2013   Chronic renal insufficiency, stage III (moderate) (HCC)    HTN (hypertension), benign 01/30/2012   Tobacco dependence 01/30/2012   Past Medical History:  Diagnosis Date   Atherosclerosis of coronary artery    On chest CT->calcif in  LAD and RCA   Chronic prostatitis    Very mild elevation of PSA (>4), referred to Urol where PSA repeat was 1.0 on 06/16/13.  Annual PSA repeat is all that is needed now per urologist.  Most recent 07/2015 was 0.45.   Chronic renal insufficiency, stage III (moderate) (HCC)    Baseline GFR as of 2019= 50s.     Closed right hip fracture ([place]) 03/2021   THA   Diabetes mellitus with complication ([place]) Dx'd fall 2011   HTN (hypertension)    Renal/aortic doppler u/s normal 06/2010   Hypercalcemia    Hyperlipidemia 2016   Statin intolerant--myalgias.  Pt refuses any further trial of statin as of 2018.   Nonspecific abnormal electrocardiogram (ECG) (EKG) Fall 2011   TWI in inferior leads: myocardial perfusion scan neg and echo normal 07/2010   Tobacco dependence    UTI (lower urinary tract infection)    Feb 2012 (klebsiella--dx at [facility]); 03/2013 e coli.     Family History  Problem Relation Age of Onset   Dementia Mother    Pneumonia Father        died in 46's of pneumonia, was otherwise healthy   Cancer Sister        colon cancer.  Died age 20   Diabetes Paternal Aunt    Diabetes Sister    Colon cancer Neg Hx     Past Surgical History:  Procedure Laterality  Date   CARDIOVASCULAR STRESS TEST  07/2010   Myocardial perfusion scan neg/low risk.   CATARACT EXTRACTION  2007, 2008   Bilateral ([facility] on [address].   COLONOSCOPY  12/20/04;12/2014   2016 no polyps: recall 5 yrs due to [place] of colon ca   ESOPHAGOGASTRODUODENOSCOPY  11/2004   esoph stricture/dilation   TOTAL HIP ARTHROPLASTY Right 04/23/2021   Procedure: TOTAL HIP ARTHROPLASTY ANTERIOR APPROACH;  Surgeon: Meredith Pel, MD;  Location: [facility];  Service: Orthopedics;  Laterality: Right;  Hana, C-arm, Depuy   TRANSTHORACIC ECHOCARDIOGRAM  07/2010   EF =>55%; LA mildly dilated; trace MR/TR;    Social History   Occupational History   Not on file  Tobacco Use   Smoking status: Every Day    Packs/day:  0.25    Years: 50.00    Pack years: 12.50    Types: Cigarettes   Smokeless tobacco: Never  Vaping Use   Vaping Use: Never used  Substance and Sexual Activity   Alcohol use: Not Currently    Alcohol/week: 0.0 standard drinks    Comment: rare alcohol   Drug use: No   Sexual activity: Not on file

## 2021-05-15 NOTE — Telephone Encounter (Signed)
Contacted patient and made him aware. Patient understood and had no additional questions or concerns.

## 2021-05-16 ENCOUNTER — Telehealth: Payer: Self-pay

## 2021-05-16 NOTE — Telephone Encounter (Signed)
[place] with [facility] would like Pre-medication note/letter faxed to [phone]. CB# [phone].  Please advise.  Thank you.

## 2021-05-16 NOTE — Telephone Encounter (Signed)
Note written and faxed 

## 2021-05-17 ENCOUNTER — Telehealth: Payer: Self-pay | Admitting: Family Medicine

## 2021-05-17 NOTE — Telephone Encounter (Signed)
Signed and put in box to go up front. Signed:  Crissie Sickles, MD           05/17/2021

## 2021-05-17 NOTE — Telephone Encounter (Signed)
Received faxed clearance form from [place] in Dr. Idelle Leech front office inbox to be signed and returned.

## 2021-05-17 NOTE — Telephone Encounter (Signed)
Placed on PCP desk to review and sign, if appropriate.  

## 2021-05-17 NOTE — Telephone Encounter (Signed)
noted 

## 2021-05-18 NOTE — Telephone Encounter (Signed)
Faxed signed form to [facility], fax confirmation received.

## 2021-05-24 ENCOUNTER — Other Ambulatory Visit: Payer: Self-pay | Admitting: Family Medicine

## 2021-05-26 ENCOUNTER — Other Ambulatory Visit: Payer: Self-pay | Admitting: Family Medicine

## 2021-06-01 ENCOUNTER — Telehealth: Payer: Self-pay | Admitting: Family Medicine

## 2021-06-01 NOTE — Telephone Encounter (Signed)
Please review and advise.

## 2021-06-01 NOTE — Telephone Encounter (Signed)
I recommend he stop his lisinopril. Monitor bp and HR daily and call with numbers in 5-7d.

## 2021-06-01 NOTE — Telephone Encounter (Signed)
FYI. Please see below.  Spoke with pt and he has been taking all meds as prescribed. Dizziness has subsided and most recent bp is 113/58. Offered to schedule appt for evaluation but he declined. He will continue to monitor and let us know next week if appt needed.

## 2021-06-01 NOTE — Telephone Encounter (Signed)
Patient states his blood pressures lately have been down to 110 / as low as 50s and he sometimes gets dizzy upon standing. He wonders if his BP medication should be changed. Please call patient to advise.

## 2021-06-01 NOTE — Telephone Encounter (Signed)
Correction: stop olmesartan.  He is not on lisinopril.

## 2021-06-01 NOTE — Telephone Encounter (Signed)
Spoke with patient regarding recommendations,voiced understanding.  

## 2021-06-07 ENCOUNTER — Telehealth: Payer: Self-pay

## 2021-06-07 NOTE — Telephone Encounter (Signed)
Pls clarify with pt-->the bp's and HR's he just reported are OFF olmesartan right?

## 2021-06-07 NOTE — Telephone Encounter (Signed)
Pt states he is feeling better now that he is able to eat some solid foods. He recently had teeth pulled and has not been able to eat much and thinks that may be why he has been dizzy.   B/P readings: 126/63 p57 125/65 p55 129/69 p58 131/68 p52 127/65 p59  States it has been a concern  in the past that his pulse may be too low.   Please advise, thanks

## 2021-06-07 NOTE — Telephone Encounter (Signed)
Patient is calling in regards to his blood pressure.  He asked to speak with Vevelyn Royals, I told patient she was out of office today. He asked if someone could call him about his blood pressure readings.  He said he would rather give them (his tracking #'s) to a nurse. I told patient no problem, I completely understand. Please call patient at home [phone]

## 2021-06-08 ENCOUNTER — Other Ambulatory Visit: Payer: Self-pay | Admitting: Family Medicine

## 2021-06-08 DIAGNOSIS — N529 Male erectile dysfunction, unspecified: Secondary | ICD-10-CM | POA: Diagnosis not present

## 2021-06-08 DIAGNOSIS — F17210 Nicotine dependence, cigarettes, uncomplicated: Secondary | ICD-10-CM | POA: Diagnosis not present

## 2021-06-08 DIAGNOSIS — S72001D Fracture of unspecified part of neck of right femur, subsequent encounter for closed fracture with routine healing: Secondary | ICD-10-CM | POA: Diagnosis not present

## 2021-06-08 DIAGNOSIS — I129 Hypertensive chronic kidney disease with stage 1 through stage 4 chronic kidney disease, or unspecified chronic kidney disease: Secondary | ICD-10-CM | POA: Diagnosis not present

## 2021-06-08 DIAGNOSIS — Z96641 Presence of right artificial hip joint: Secondary | ICD-10-CM | POA: Diagnosis not present

## 2021-06-08 DIAGNOSIS — K5909 Other constipation: Secondary | ICD-10-CM | POA: Diagnosis not present

## 2021-06-08 DIAGNOSIS — E1122 Type 2 diabetes mellitus with diabetic chronic kidney disease: Secondary | ICD-10-CM | POA: Diagnosis not present

## 2021-06-08 DIAGNOSIS — N1831 Chronic kidney disease, stage 3a: Secondary | ICD-10-CM | POA: Diagnosis not present

## 2021-06-08 DIAGNOSIS — E785 Hyperlipidemia, unspecified: Secondary | ICD-10-CM | POA: Diagnosis not present

## 2021-06-08 DIAGNOSIS — Z9181 History of falling: Secondary | ICD-10-CM | POA: Diagnosis not present

## 2021-06-08 NOTE — Telephone Encounter (Signed)
Spoke with pt and advised of recommendations, he voiced understanding.

## 2021-06-08 NOTE — Telephone Encounter (Signed)
Yes, go ahead and cut carvedilol in half.

## 2021-06-08 NOTE — Telephone Encounter (Signed)
Spoke with pt and BP and HR readings were without being on Olmesartan. He wanted to know if he needs to cut carvedilol in 1/2 due to HR being in the 50's.  Please review and advise

## 2021-06-10 ENCOUNTER — Other Ambulatory Visit: Payer: Self-pay | Admitting: Family Medicine

## 2021-06-15 ENCOUNTER — Telehealth: Payer: Self-pay | Admitting: Family Medicine

## 2021-06-15 NOTE — Telephone Encounter (Signed)
Please contact pt and schedule 6 mo f/u visit due 09/16/21. I will need to document on appt once scheduled. Please let me know once appt is made.

## 2021-07-05 DIAGNOSIS — Z792 Long term (current) use of antibiotics: Secondary | ICD-10-CM | POA: Diagnosis not present

## 2021-07-05 DIAGNOSIS — Z9181 History of falling: Secondary | ICD-10-CM | POA: Diagnosis not present

## 2021-07-05 DIAGNOSIS — E1122 Type 2 diabetes mellitus with diabetic chronic kidney disease: Secondary | ICD-10-CM | POA: Diagnosis not present

## 2021-07-05 DIAGNOSIS — Z96641 Presence of right artificial hip joint: Secondary | ICD-10-CM | POA: Diagnosis not present

## 2021-07-05 DIAGNOSIS — I129 Hypertensive chronic kidney disease with stage 1 through stage 4 chronic kidney disease, or unspecified chronic kidney disease: Secondary | ICD-10-CM | POA: Diagnosis not present

## 2021-07-05 DIAGNOSIS — S72001D Fracture of unspecified part of neck of right femur, subsequent encounter for closed fracture with routine healing: Secondary | ICD-10-CM | POA: Diagnosis not present

## 2021-07-05 DIAGNOSIS — F17210 Nicotine dependence, cigarettes, uncomplicated: Secondary | ICD-10-CM | POA: Diagnosis not present

## 2021-07-05 DIAGNOSIS — E785 Hyperlipidemia, unspecified: Secondary | ICD-10-CM | POA: Diagnosis not present

## 2021-07-05 DIAGNOSIS — N1831 Chronic kidney disease, stage 3a: Secondary | ICD-10-CM | POA: Diagnosis not present

## 2021-07-10 ENCOUNTER — Telehealth: Payer: Medicare Other | Admitting: Family Medicine

## 2021-07-10 ENCOUNTER — Ambulatory Visit (INDEPENDENT_AMBULATORY_CARE_PROVIDER_SITE_OTHER): Payer: Medicare Other | Admitting: Orthopedic Surgery

## 2021-07-10 ENCOUNTER — Other Ambulatory Visit: Payer: Self-pay

## 2021-07-10 DIAGNOSIS — S72001A Fracture of unspecified part of neck of right femur, initial encounter for closed fracture: Secondary | ICD-10-CM

## 2021-07-13 ENCOUNTER — Ambulatory Visit (INDEPENDENT_AMBULATORY_CARE_PROVIDER_SITE_OTHER): Payer: Medicare Other | Admitting: Family Medicine

## 2021-07-13 ENCOUNTER — Other Ambulatory Visit: Payer: Self-pay

## 2021-07-13 ENCOUNTER — Encounter: Payer: Self-pay | Admitting: Family Medicine

## 2021-07-13 VITALS — BP 158/77 | HR 57 | Temp 97.4°F | Ht 65.0 in | Wt 156.0 lb

## 2021-07-13 DIAGNOSIS — Z23 Encounter for immunization: Secondary | ICD-10-CM | POA: Diagnosis not present

## 2021-07-13 DIAGNOSIS — N183 Chronic kidney disease, stage 3 unspecified: Secondary | ICD-10-CM | POA: Diagnosis not present

## 2021-07-13 DIAGNOSIS — R42 Dizziness and giddiness: Secondary | ICD-10-CM

## 2021-07-13 DIAGNOSIS — N1831 Chronic kidney disease, stage 3a: Secondary | ICD-10-CM

## 2021-07-13 DIAGNOSIS — N2889 Other specified disorders of kidney and ureter: Secondary | ICD-10-CM

## 2021-07-13 DIAGNOSIS — E1122 Type 2 diabetes mellitus with diabetic chronic kidney disease: Secondary | ICD-10-CM | POA: Diagnosis not present

## 2021-07-13 NOTE — Progress Notes (Signed)
OFFICE VISIT  07/13/2021  CC:  Chief Complaint  Patient presents with   Balance issues    States his balance has been off since last OV and Blue comes to the house for PT and balance has gotten worse. She said he has BPBB in his R ear, it was recommended he see ENT   HPI:    Patient is a 75 y.o. male who presents for ongoing concerns about poor balance. I last saw him 03/17/21. A/P as of that visit: "1) DM: good control. Cont pioglitazone. Hba1c and lytes/cr today and urine microalb/cr today.   2) HTN: well controlled. Cont coreg 6.25 bid and olmesartan 1/2 20mg  tab qd.   3) CRI III: he avoids NSAIDs. Hydrates fairly well. BMET today.   4) HLD: statin intol.  Pt declines ANY further med tx for this problem.   5) Deconditioning: secondary to his fibula fx 6 mo ago and the immobility that was required while this healed.  His gait is unsteady and he needs PT to help this but he declined this today, wants to just continue to work on things at home.  Continue rolling walker prn.   6) Preventative health care: Tdap rx sent to pharmacy again ([facility] [address]  INTERIM HX: Unfortunately on 04/22/21 he lost balance and fell in his driveway. He fractured his R femoral neck.  He  He got THA 04/23/21.  Has been rehabing.  He has a nonspecific description of feeling off balance intermittently.  Denies actual vertigo but says sometimes movements of his head to the right or left feels like his eyes have to catch up a little bit, does not happen every time.  Occasionally If he leans over forward and then sits back up he feels little of the same sensation.  No presyncope.  No ringing in the ears and no change in hearing.  He does not feel off balance his legs, denies any sensory problems in legs or feet.  Denies tremors or focal weakness or speech problems.  No vision abnormalities. It seems this has been insidious in onset.  He never really had any significant pain after his hip fracture.   His rehab has suffered due to his disequilibrium problems though. Has only an occasional headache, usually the back of his head and focal and intense for about 5 to 10 minutes then resolves.  CT head 04/22/21 ->No acute intracranial abnormality.  This was done by ortho when he was complaining of the disequilibrium when admitted to hosp for his hip fracture.  ROS as above, plus--> no fevers, no CP, no SOB, no wheezing, no cough, no  no rashes, no melena/hematochezia.  No polyuria or polydipsia.  No myalgias or arthralgias.  No focal weakness, paresthesias, or tremors.  No acute vision or hearing abnormalities.  No dysuria or unusual/new urinary urgency or frequency.  No recent changes in lower legs. No n/v/d or abd pain.  No palpitations.    Past Medical History:  Diagnosis Date   Atherosclerosis of coronary artery    On chest CT->calcif in LAD and RCA   Chronic prostatitis    Very mild elevation of PSA (>4), referred to Urol where PSA repeat was 1.0 on 06/16/13.  Annual PSA repeat is all that is needed now per urologist.  Most recent 07/2015 was 0.45.   Chronic renal insufficiency, stage III (moderate) (HCC)    Baseline GFR as of 2019= 50s.     Closed right hip fracture ([place]) 03/2021  THA   Diabetes mellitus with complication ([place]) Dx'd fall 2011   HTN (hypertension)    Renal/aortic doppler u/s normal 06/2010   Hypercalcemia    Hyperlipidemia 2016   Statin intolerant--myalgias.  Pt refuses any further trial of statin as of 2018.   Nonspecific abnormal electrocardiogram (ECG) (EKG) Fall 2011   TWI in inferior leads: myocardial perfusion scan neg and echo normal 07/2010   Tobacco dependence    UTI (lower urinary tract infection)    Feb 2012 (klebsiella--dx at [facility]); 03/2013 e coli.     Past Surgical History:  Procedure Laterality Date   CARDIOVASCULAR STRESS TEST  07/2010   Myocardial perfusion scan neg/low risk.   CATARACT EXTRACTION  2007, 2008   Bilateral ([facility] on [address].   COLONOSCOPY  12/20/04;12/2014   2016 no polyps: recall 5 yrs due to [place] of colon ca   ESOPHAGOGASTRODUODENOSCOPY  11/2004   esoph stricture/dilation   TOTAL HIP ARTHROPLASTY Right 04/23/2021   Procedure: TOTAL HIP ARTHROPLASTY ANTERIOR APPROACH;  Surgeon: Meredith Pel, MD;  Location: [facility];  Service: Orthopedics;  Laterality: Right;  Hana, C-arm, Depuy   TRANSTHORACIC ECHOCARDIOGRAM  07/2010   EF =>55%; LA mildly dilated; trace MR/TR;     Outpatient Medications Prior to Visit  Medication Sig Dispense Refill   acetaminophen (TYLENOL) 500 MG tablet Take 1 tablet (500 mg total) by mouth every 6 (six) hours as needed for mild pain or fever (pain score 1-3 or temp > 100.5).     carvedilol (COREG) 6.25 MG tablet TAKE 1 TABLET BY MOUTH TWICE DAILY WITH A MEAL 180 tablet 0   furosemide (LASIX) 40 MG tablet TAKE 1 TABLET BY MOUTH ONCE DAILY . APPOINTMENT REQUIRED FOR FUTURE REFILLS 90 tablet 3   ipratropium (ATROVENT) 0.03 % nasal spray USE 2 SPRAY(S) IN EACH NOSTRIL EVERY 12 HOURS 30 mL 1   pioglitazone (ACTOS) 15 MG tablet TAKE 1/2 TO 1 (ONE-HALF TO ONE) TABLET BY MOUTH ONCE DAILY 90 tablet 0   esomeprazole (NEXIUM) 20 MG capsule Take 1 capsule (20 mg total) by mouth daily at 12 noon. 30 capsule 2   olmesartan (BENICAR) 20 MG tablet Take 1/2 (one-half) tablet by mouth once daily (Patient not taking: Reported on 07/13/2021) 45 tablet 0   No facility-administered medications prior to visit.    Allergies  Allergen Reactions   Bactrim [Sulfamethoxazole-Trimethoprim] Nausea Only    ROS As per HPI  PE: Vitals with BMI 07/13/2021 04/25/2021 04/25/2021  Height 5\' 5"  - -  Weight 156 lbs - -  BMI 99.35 - -  Systolic 701 779 390  Diastolic 77 78 65  Pulse 57 88 66    Gen: Alert, NAD but tired/chronically ill-appearing.  Patient is oriented to person, place, time, and situation. AFFECT: pleasant, lucid thought and speech. ZES:PQZR: no injection, icteris,  swelling, or exudate.  EOMI, PERRLA. Mouth: lips without lesion/swelling.  Oral mucosa pink and moist. Oropharynx without erythema, exudate, or swelling.  Neck: ROM intact CV: RRR, no m/r/g.   LUNGS: CTA bilat, nonlabored resps, good aeration in all lung fields. Neuro: CN 2-12 intact bilaterally, strength 5/5 in proximal and distal upper extremities and lower extremities bilaterally.  No sensory deficits.  No tremor.  No disdiadochokinesis.  No pronator drift. Gait is markedly unsteady when he attempts to walk w/out walker. Dix-Halpike did not induce vertigo, nausea, or nystagmus.  LABS:    Chemistry      Component Value Date/Time   NA 131 (  L) 04/24/2021 0302   NA 126 (A) 02/17/2019 0000   K 4.1 04/24/2021 0302   CL 97 (L) 04/24/2021 0302   CO2 26 04/24/2021 0302   BUN 20 04/24/2021 0302   BUN 12 02/17/2019 0000   CREATININE 1.41 (H) 04/24/2021 0302   CREATININE 1.87 (H) 05/29/2012 1610   GLU 100 02/17/2019 0000      Component Value Date/Time   CALCIUM 8.8 (L) 04/24/2021 0302   ALKPHOS 87 04/23/2021 0010   AST 18 04/23/2021 0010   ALT 11 04/23/2021 0010   BILITOT 0.7 04/23/2021 0010   BILITOT 0.3 02/16/2019 0953     Lab Results  Component Value Date   WBC 10.2 04/25/2021   HGB 11.7 (L) 04/25/2021   HCT 34.3 (L) 04/25/2021   MCV 89.1 04/25/2021   PLT 195 04/25/2021   Lab Results  Component Value Date   HGBA1C 6.8 (H) 03/17/2021   IMPRESSION AND PLAN:  #1: Nonspecific dizziness, unknown etiology, unknown prognosis. This problem contributed to his fall on 723 in which he sustained his hip fracture.  He is having trouble progressing much with rehab for his hip due to this problem. CT head imaging 04/22/2021 without acute abnormality. Referral to neurology ordered.  #2 diabetes with renal complications.  Rhetorically well-controlled on Actos 15 mg a day.  Hemoglobin A1c today.  Chronic renal insufficiency stage III: He avoids NSAIDs. Electrolytes and renal function  checked today.  An After Visit Summary was printed and given to the patient.  FOLLOW UP: Return for keep your appt that is already set up for december.  Signed:  Crissie Sickles, MD           07/13/2021

## 2021-07-14 ENCOUNTER — Encounter: Payer: Self-pay | Admitting: Orthopedic Surgery

## 2021-07-14 ENCOUNTER — Telehealth: Payer: Self-pay | Admitting: Orthopedic Surgery

## 2021-07-14 LAB — BASIC METABOLIC PANEL
BUN: 11 mg/dL (ref 6–23)
CO2: 27 mEq/L (ref 19–32)
Calcium: 9.4 mg/dL (ref 8.4–10.5)
Chloride: 92 mEq/L — ABNORMAL LOW (ref 96–112)
Creatinine, Ser: 1.28 mg/dL (ref 0.40–1.50)
GFR: 55.43 mL/min — ABNORMAL LOW (ref >60.00)
Glucose, Bld: 99 mg/dL (ref 70–99)
Potassium: 4 mEq/L (ref 3.5–5.1)
Sodium: 128 mEq/L — ABNORMAL LOW (ref 135–145)

## 2021-07-14 LAB — HEMOGLOBIN A1C: Hgb A1c MFr Bld: 6.3 % (ref 4.6–6.5)

## 2021-07-14 NOTE — Progress Notes (Signed)
Post-Op Visit Note   Patient: Antonio Serrano           Date of Birth: 11-18-1945           MRN: [id number] Visit Date: 07/10/2021 PCP: Tammi Sou, MD   Assessment & Plan:  Chief Complaint:  Chief Complaint  Patient presents with   Right Hip - Routine Post Op    right total hip replacement 04/23/2021 for hip fracture   Visit Diagnoses:  1. Closed fracture of neck of right femur, initial encounter ([place])     Plan: [name] is a patient who is now 3 months out right total hip replacement for hip fracture.  Ambulating with walker.  Hip feels well.  Does not have any pain.  Having some issues with balance.  On examination he has equal leg lengths and excellent hip abduction adduction strength.  Plan is activity as tolerated and follow-up as needed.  Follow-Up Instructions: Return if symptoms worsen or fail to improve.   Orders:  No orders of the defined types were placed in this encounter.  No orders of the defined types were placed in this encounter.   Imaging: No results found.  PMFS History: Patient Active Problem List   Diagnosis Date Noted   Closed right hip fracture, initial encounter ([place]) 04/23/2021   Diabetes mellitus with complication ([place]) 30/06/2329   Hyperlipidemia 07/29/2015   Conjunctivitis 06/27/2015   Healthcare maintenance 06/27/2015   Type 2 diabetes mellitus with stage 3a chronic kidney disease, without long-term current use of insulin ([place]) 07/16/2014   Constipation, chronic 07/16/2014   Preventative health care 05/18/2014   Testicular pain, right 06/25/2013   Erectile dysfunction 05/31/2013   Chronic renal insufficiency, stage III (moderate) (HCC)    HTN (hypertension), benign 01/30/2012   Tobacco dependence 01/30/2012   Past Medical History:  Diagnosis Date   Atherosclerosis of coronary artery    On chest CT->calcif in LAD and RCA   Chronic prostatitis    Very mild elevation of PSA (>4), referred to Urol where PSA repeat was 1.0 on  06/16/13.  Annual PSA repeat is all that is needed now per urologist.  Most recent 07/2015 was 0.45.   Chronic renal insufficiency, stage III (moderate) (HCC)    Baseline GFR as of 2019= 50s.     Closed right hip fracture ([place]) 03/2021   THA   Diabetes mellitus with complication ([place]) Dx'd fall 2011   HTN (hypertension)    Renal/aortic doppler u/s normal 06/2010   Hypercalcemia    Hyperlipidemia 2016   Statin intolerant--myalgias.  Pt refuses any further trial of statin as of 2018.   Nonspecific abnormal electrocardiogram (ECG) (EKG) Fall 2011   TWI in inferior leads: myocardial perfusion scan neg and echo normal 07/2010   Tobacco dependence    UTI (lower urinary tract infection)    Feb 2012 (klebsiella--dx at [facility]); 03/2013 e coli.     Family History  Problem Relation Age of Onset   Dementia Mother    Pneumonia Father        died in 71's of pneumonia, was otherwise healthy   Cancer Sister        colon cancer.  Died age 72   Diabetes Paternal Aunt    Diabetes Sister    Colon cancer Neg Hx     Past Surgical History:  Procedure Laterality Date   CARDIOVASCULAR STRESS TEST  07/2010   Myocardial perfusion scan neg/low risk.   CATARACT EXTRACTION  2007, 2008  Bilateral ([facility] on [address].   COLONOSCOPY  12/20/04;12/2014   2016 no polyps: recall 5 yrs due to [place] of colon ca   ESOPHAGOGASTRODUODENOSCOPY  11/2004   esoph stricture/dilation   TOTAL HIP ARTHROPLASTY Right 04/23/2021   Procedure: TOTAL HIP ARTHROPLASTY ANTERIOR APPROACH;  Surgeon: Meredith Pel, MD;  Location: [facility];  Service: Orthopedics;  Laterality: Right;  Hana, C-arm, Depuy   TRANSTHORACIC ECHOCARDIOGRAM  07/2010   EF =>55%; LA mildly dilated; trace MR/TR;    Social History   Occupational History   Not on file  Tobacco Use   Smoking status: Every Day    Packs/day: 0.25    Years: 50.00    Pack years: 12.50    Types: Cigarettes   Smokeless tobacco: Never  Vaping Use   Vaping  Use: Never used  Substance and Sexual Activity   Alcohol use: Not Currently    Alcohol/week: 0.0 standard drinks    Comment: rare alcohol   Drug use: No   Sexual activity: Not on file

## 2021-07-14 NOTE — Telephone Encounter (Signed)
Nothing seems out of the ordinary unless this persists for several days and causes increased pain

## 2021-07-14 NOTE — Telephone Encounter (Signed)
Pt laid on left side and now its doing a bit if burning an inch belong the scar line. The pt just wanted to verify if this normal. The best call back number is [phone].

## 2021-07-17 NOTE — Telephone Encounter (Signed)
Attempted to contact patient however was not able to leave voicemail.

## 2021-07-19 ENCOUNTER — Encounter: Payer: Self-pay | Admitting: Neurology

## 2021-08-17 ENCOUNTER — Other Ambulatory Visit: Payer: Self-pay | Admitting: Family Medicine

## 2021-09-13 ENCOUNTER — Other Ambulatory Visit: Payer: Self-pay

## 2021-09-13 ENCOUNTER — Telehealth (INDEPENDENT_AMBULATORY_CARE_PROVIDER_SITE_OTHER): Payer: Medicare Other | Admitting: Family Medicine

## 2021-09-13 VITALS — BP 118/62 | HR 61 | Wt 152.0 lb

## 2021-09-13 DIAGNOSIS — E1122 Type 2 diabetes mellitus with diabetic chronic kidney disease: Secondary | ICD-10-CM | POA: Diagnosis not present

## 2021-09-13 DIAGNOSIS — I10 Essential (primary) hypertension: Secondary | ICD-10-CM

## 2021-09-13 DIAGNOSIS — N1831 Chronic kidney disease, stage 3a: Secondary | ICD-10-CM

## 2021-09-13 DIAGNOSIS — R42 Dizziness and giddiness: Secondary | ICD-10-CM

## 2021-09-13 DIAGNOSIS — E7800 Pure hypercholesterolemia, unspecified: Secondary | ICD-10-CM | POA: Diagnosis not present

## 2021-09-13 DIAGNOSIS — T380X5A Adverse effect of glucocorticoids and synthetic analogues, initial encounter: Secondary | ICD-10-CM

## 2021-09-13 DIAGNOSIS — N183 Chronic kidney disease, stage 3 unspecified: Secondary | ICD-10-CM | POA: Diagnosis not present

## 2021-09-13 DIAGNOSIS — G720 Drug-induced myopathy: Secondary | ICD-10-CM

## 2021-09-13 MED ORDER — IPRATROPIUM BROMIDE 0.03 % NA SOLN: NASAL | 11 refills | Status: AC

## 2021-09-13 MED ORDER — ESOMEPRAZOLE MAGNESIUM 20 MG PO CPDR: 20.0000 mg | DELAYED_RELEASE_CAPSULE | Freq: Every day | ORAL | 3 refills | Status: DC

## 2021-09-13 MED ORDER — CARVEDILOL 6.25 MG PO TABS
6.2500 mg | ORAL_TABLET | Freq: Two times a day (BID) | ORAL | 3 refills | Status: DC
Start: 2021-09-13 — End: 2022-02-15

## 2021-09-13 NOTE — Progress Notes (Signed)
Virtual Visit via Video Note  I connected with Antonio Serrano on 09/13/21 at  1:00 PM EST by a video enabled telemedicine application and verified that I am speaking with the correct person using two identifiers.  Location patient: home, [place] Location provider:work or home office Persons participating in the virtual visit: patient, provider  I discussed the limitations of evaluation and management by telemedicine and the availability of in person appointments. The patient expressed understanding and agreed to proceed.   HPI: 75 y/o male being seen today for f/u DM, HTN, CRI III, HLD. A/P as of last visit 2 mo ago: "#1: Nonspecific dizziness, unknown etiology, unknown prognosis. This problem contributed to his fall on 723 in which he sustained his hip fracture.  He is having trouble progressing much with rehab for his hip due to this problem. CT head imaging 04/22/2021 without acute abnormality. Referral to neurology ordered.   #2 diabetes with renal complications.  Rhetorically well-controlled on Actos 15 mg a day.  Hemoglobin A1c today.  #3. Chronic renal insufficiency stage III: He avoids NSAIDs. Electrolytes and renal function checked today."  INTERIM HX: Antonio Serrano is still feeling intermittent dizziness/vertigo feeling on some days but some days he has none.  Says it is consistently triggered by a sudden head movement up or to the side.  Lasts a few seconds.  No nausea.  No hearing changes or ringing in the ears.  No headaches. He says neurology was not going to be able to see him for another 4 months so he declined to schedule. Says home blood pressures were back up just a little so he restarted his Benicar half tab a day.  When blood pressures came back down into normal or low normal range he stopped taking Benicar. Taking 1/2 coreg 6.25mg  bid and lasix 40 qd. Heart rate in the 60s.  No home glucose monitoring.  He did finish PT.  History of hyperlipidemia and we have tried statins and he  says these made his muscles hurt.  He declines any further trial of statin.  ROS as above, plus--> no fevers, no CP, no SOB, no wheezing, no cough, no rashes, no melena/hematochezia.  No polyuria or polydipsia.  No myalgias or arthralgias.  No focal weakness, paresthesias, or tremors.  No acute vision or hearing abnormalities.  No dysuria or unusual/new urinary urgency or frequency.  No recent changes in lower legs. No n/v/d or abd pain.  No palpitations.     Past Medical History:  Diagnosis Date   Atherosclerosis of coronary artery    On chest CT->calcif in LAD and RCA   Chronic prostatitis    Very mild elevation of PSA (>4), referred to Urol where PSA repeat was 1.0 on 06/16/13.  Annual PSA repeat is all that is needed now per urologist.  Most recent 07/2015 was 0.45.   Chronic renal insufficiency, stage III (moderate) (HCC)    Baseline GFR as of 2019= 50s.     Closed right hip fracture ([place]) 03/2021   THA   Diabetes mellitus with complication ([place]) Dx'd fall 2011   HTN (hypertension)    Renal/aortic doppler u/s normal 06/2010   Hypercalcemia    Hyperlipidemia 2016   Statin intolerant--myalgias.  Pt refuses any further trial of statin as of 2018.   Nonspecific abnormal electrocardiogram (ECG) (EKG) Fall 2011   TWI in inferior leads: myocardial perfusion scan neg and echo normal 07/2010   Tobacco dependence    UTI (lower urinary tract infection)    Feb  2012 (klebsiella--dx at [facility]); 03/2013 e coli.     Past Surgical History:  Procedure Laterality Date   CARDIOVASCULAR STRESS TEST  07/2010   Myocardial perfusion scan neg/low risk.   CATARACT EXTRACTION  2007, 2008   Bilateral ([facility] on [address].   COLONOSCOPY  12/20/04;12/2014   2016 no polyps: recall 5 yrs due to [place] of colon ca   ESOPHAGOGASTRODUODENOSCOPY  11/2004   esoph stricture/dilation   TOTAL HIP ARTHROPLASTY Right 04/23/2021   Procedure: TOTAL HIP ARTHROPLASTY ANTERIOR APPROACH;  Surgeon: Meredith Pel, MD;  Location: [facility];  Service: Orthopedics;  Laterality: Right;  Hana, C-arm, Depuy   TRANSTHORACIC ECHOCARDIOGRAM  07/2010   EF =>55%; LA mildly dilated; trace MR/TR;      Current Outpatient Medications:    acetaminophen (TYLENOL) 500 MG tablet, Take 1 tablet (500 mg total) by mouth every 6 (six) hours as needed for mild pain or fever (pain score 1-3 or temp > 100.5)., Disp: , Rfl:    carvedilol (COREG) 6.25 MG tablet, TAKE 1 TABLET BY MOUTH TWICE DAILY WITH A MEAL, Disp: 180 tablet, Rfl: 0   furosemide (LASIX) 40 MG tablet, TAKE 1 TABLET BY MOUTH ONCE DAILY . APPOINTMENT REQUIRED FOR FUTURE REFILLS, Disp: 90 tablet, Rfl: 3   ipratropium (ATROVENT) 0.03 % nasal spray, USE 2 SPRAY(S) IN EACH NOSTRIL EVERY 12 HOURS, Disp: 30 mL, Rfl: 1   pioglitazone (ACTOS) 15 MG tablet, TAKE 1/2 TO 1 (ONE-HALF TO ONE) TABLET BY MOUTH ONCE DAILY, Disp: 90 tablet, Rfl: 0   esomeprazole (NEXIUM) 20 MG capsule, Take 1 capsule (20 mg total) by mouth daily at 12 noon., Disp: 30 capsule, Rfl: 2   olmesartan (BENICAR) 20 MG tablet, Take 1/2 (one-half) tablet by mouth once daily (Patient not taking: Reported on 07/13/2021), Disp: 45 tablet, Rfl: 0  EXAM:  VITALS per patient if applicable:  Vitals with BMI 09/13/2021 07/13/2021 04/25/2021  Height - 5\' 5"  -  Weight 152 lbs 156 lbs -  BMI - 03.50 -  Systolic 093 818 299  Diastolic 62 77 78  Pulse 61 57 88   GENERAL: alert, oriented, appears well and in no acute distress  HEENT: atraumatic, conjunttiva clear, no obvious abnormalities on inspection of external nose and ears  NECK: normal movements of the head and neck  LUNGS: on inspection no signs of respiratory distress, breathing rate appears normal, no obvious gross SOB, gasping or wheezing  CV: no obvious cyanosis  MS: moves all visible extremities without noticeable abnormality  PSYCH/NEURO: pleasant and cooperative, no obvious depression or anxiety, speech and thought processing  grossly intact  LABS: none today    Chemistry      Component Value Date/Time   NA 128 (L) 07/13/2021 1526   NA 126 (A) 02/17/2019 0000   K 4.0 07/13/2021 1526   CL 92 (L) 07/13/2021 1526   CO2 27 07/13/2021 1526   BUN 11 07/13/2021 1526   BUN 12 02/17/2019 0000   CREATININE 1.28 07/13/2021 1526   CREATININE 1.87 (H) 05/29/2012 1610   GLU 100 02/17/2019 0000      Component Value Date/Time   CALCIUM 9.4 07/13/2021 1526   ALKPHOS 87 04/23/2021 0010   AST 18 04/23/2021 0010   ALT 11 04/23/2021 0010   BILITOT 0.7 04/23/2021 0010   BILITOT 0.3 02/16/2019 0953     Lab Results  Component Value Date   WBC 10.2 04/25/2021   HGB 11.7 (L) 04/25/2021   HCT 34.3 (L)  04/25/2021   MCV 89.1 04/25/2021   PLT 195 04/25/2021   Lab Results  Component Value Date   HGBA1C 6.3 07/13/2021   ASSESSMENT AND PLAN:  Discussed the following assessment and plan:  #1 diabetes.  Has been well controlled.  He takes Actos daily.  Next A1c due at the end of January.  2.  Hypertension.  He has had a little bit of lower blood pressure intermittently.  Seems to be doing well now on Coreg one half of a 6.25 mg pill twice a day.  He will hold off on restart of Benicar at this time.  3.  Chronic renal insufficiency stage III.  This has shown stability over time.  He avoids NSAIDs and tries to hydrate well  4.  Vertigo, seems exclusively positional induced.  When I initially saw him a couple of months ago for this it was not clear to me whether he was having nonspecific dizziness or vertigo, but now it seems more clear.  He does want a move forward with ENT consultation.  #5 debilitated patient, status post hip fracture and surgical repair 5 months ago.  He is finished PT.  Gradually gaining strength and back to eating and drinking normally.  #6 lung ca screening-> patient has a 35-40-pack-year history of smoking cigarettes and is currently still smoking half pack a day.  Pt does want lung cancer screening  but wants to pursue this at a later time.  #7 hyperlipidemia.  Statin induced myopathy.  He declines any further trial of cholesterol medication.  I discussed the assessment and treatment plan with the patient. The patient was provided an opportunity to ask questions and all were answered. The patient agreed with the plan and demonstrated an understanding of the instructions.   We will defer labs until I see him again for follow-up/CPE in 3 months.  F/u: 3 mo  Signed:  Crissie Sickles, MD           09/13/2021

## 2021-09-26 ENCOUNTER — Ambulatory Visit: Payer: Medicare Other | Admitting: Neurology

## 2021-11-08 ENCOUNTER — Encounter: Payer: Self-pay | Admitting: Orthopedic Surgery

## 2021-11-08 ENCOUNTER — Ambulatory Visit: Payer: Self-pay

## 2021-11-08 ENCOUNTER — Other Ambulatory Visit: Payer: Self-pay

## 2021-11-08 ENCOUNTER — Ambulatory Visit: Payer: Medicare Other | Admitting: Orthopedic Surgery

## 2021-11-08 DIAGNOSIS — M79604 Pain in right leg: Secondary | ICD-10-CM | POA: Diagnosis not present

## 2021-11-08 DIAGNOSIS — M25551 Pain in right hip: Secondary | ICD-10-CM | POA: Diagnosis not present

## 2021-11-08 MED ORDER — METHOCARBAMOL 500 MG PO TABS: ORAL_TABLET | ORAL | 0 refills | Status: DC

## 2021-11-08 NOTE — Progress Notes (Signed)
Office Visit Note   Patient: Antonio Serrano           Date of Birth: 1946/05/13           MRN: [id number] Visit Date: 11/08/2021 Requested by: Tammi Sou, MD [address] [place],  [place] [address] PCP: Tammi Sou, MD  Subjective: Chief Complaint  Patient presents with   Right Leg - Pain    HPI: Antonio Serrano is a 76 year old patient with right hip pain.  Underwent right total hip replacement 04/23/2021 for fracture.  Ambulating with walker.  Has had low back pain for 3 days.  Hip flexion helps his symptoms.  Reports occasional groin pain he sustained a twisting type injury on his bed back in November.              ROS: All systems reviewed are negative as they relate to the chief complaint within the history of present illness.  Patient denies  fevers or chills.   Assessment & Plan: Visit Diagnoses:  1. Pain in right leg     Plan: Impression is right leg pain which looks potentially radicular.  Radiographs show the small greater trochanteric fragment has more of an ossicle appearance but not much displacement.  Hip is located.  Strength is pretty reasonable when he ambulates.  No settling of the prosthesis.  We are going to try him on some Robaxin and and have him follow-up in 6 to 8 weeks if he is no better.  Could consider CT scanning through the hip at that time but do not envision seeing anything operative.  Could also consider back scanning with injections if the symptoms crystallized to become more radicular in nature.  llow-Up Instructions: No follow-ups on file.   Orders:  Orders Placed This Encounter  Procedures   XR HIP UNILAT W OR W/O PELVIS 2-3 VIEWS RIGHT   XR Lumbar Spine 2-3 Views   Meds ordered this encounter  Medications   methocarbamol (ROBAXIN) 500 MG tablet    Sig: 1 po q hs prn    Dispense:  30 tablet    Refill:  0      Procedures: No procedures performed   Clinical Data: No additional findings.  Objective: Vital Signs: There were  no vitals taken for this visit.  Physical Exam:   Constitutional: Patient appears well-developed HEENT:  Head: Normocephalic Eyes:EOM are normal Neck: Normal range of motion Cardiovascular: Normal rate Pulmonary/chest: Effort normal Neurologic: Patient is alert Skin: Skin is warm Psychiatric: Patient has normal mood and affect   Ortho Exam: Ortho exam demonstrates no groin pain with internal/external rotation of either leg.  Leg lengths equal.  He is able to stand on either foot and keep his pelvis level.  Hip flexion a little weaker on the right compared to the left.  Abduction adduction strength intact.  Specialty Comments:  No specialty comments available.  Imaging: XR HIP UNILAT W OR W/O PELVIS 2-3 VIEWS RIGHT  Result Date: 11/08/2021 AP pelvis lateral right hip reviewed.  Total hip prosthesis unchanged in alignment and position compared to prior radiographs several months ago.  No settling of the prosthesis based on calcar prosthetic distance to native bone.  Avulsion fracture greater trochanter has had no further displacement but does appear more rounded than prior radiographs.  No dislocation.  No lucency around the bone prosthetic interface    PMFS History: Patient Active Problem List   Diagnosis Date Noted   Closed right hip fracture, initial  encounter ([place]) 04/23/2021   Diabetes mellitus with complication ([place]) 75/07/2584   Hyperlipidemia 07/29/2015   Conjunctivitis 06/27/2015   Healthcare maintenance 06/27/2015   Type 2 diabetes mellitus with stage 3a chronic kidney disease, without long-term current use of insulin ([place]) 07/16/2014   Constipation, chronic 07/16/2014   Preventative health care 05/18/2014   Testicular pain, right 06/25/2013   Erectile dysfunction 05/31/2013   Chronic renal insufficiency, stage III (moderate) (HCC)    HTN (hypertension), benign 01/30/2012   Tobacco dependence 01/30/2012   Past Medical History:  Diagnosis Date   Atherosclerosis of  coronary artery    On chest CT->calcif in LAD and RCA   Chronic prostatitis    Very mild elevation of PSA (>4), referred to Urol where PSA repeat was 1.0 on 06/16/13.  Annual PSA repeat is all that is needed now per urologist.  Most recent 07/2015 was 0.45.   Chronic renal insufficiency, stage III (moderate) (HCC)    Baseline GFR as of 2019= 50s.     Closed right hip fracture ([place]) 03/2021   THA   Diabetes mellitus with complication ([place]) Dx'd fall 2011   HTN (hypertension)    Renal/aortic doppler u/s normal 06/2010   Hypercalcemia    Hyperlipidemia 2016   Statin intolerant--myalgias.  Pt refuses any further trial of statin as of 2018.   Nonspecific abnormal electrocardiogram (ECG) (EKG) Fall 2011   TWI in inferior leads: myocardial perfusion scan neg and echo normal 07/2010   Tobacco dependence    UTI (lower urinary tract infection)    Feb 2012 (klebsiella--dx at [facility]); 03/2013 e coli.     Family History  Problem Relation Age of Onset   Dementia Mother    Pneumonia Father        died in 63's of pneumonia, was otherwise healthy   Cancer Sister        colon cancer.  Died age 6   Diabetes Paternal Aunt    Diabetes Sister    Colon cancer Neg Hx     Past Surgical History:  Procedure Laterality Date   CARDIOVASCULAR STRESS TEST  07/2010   Myocardial perfusion scan neg/low risk.   CATARACT EXTRACTION  2007, 2008   Bilateral ([facility] on [address].   COLONOSCOPY  12/20/04;12/2014   2016 no polyps: recall 5 yrs due to [place] of colon ca   ESOPHAGOGASTRODUODENOSCOPY  11/2004   esoph stricture/dilation   TOTAL HIP ARTHROPLASTY Right 04/23/2021   Procedure: TOTAL HIP ARTHROPLASTY ANTERIOR APPROACH;  Surgeon: Meredith Pel, MD;  Location: [facility];  Service: Orthopedics;  Laterality: Right;  Hana, C-arm, Depuy   TRANSTHORACIC ECHOCARDIOGRAM  07/2010   EF =>55%; LA mildly dilated; trace MR/TR;    Social History   Occupational History   Not on file  Tobacco Use    Smoking status: Every Day    Packs/day: 0.25    Years: 50.00    Pack years: 12.50    Types: Cigarettes   Smokeless tobacco: Never  Vaping Use   Vaping Use: Never used  Substance and Sexual Activity   Alcohol use: Not Currently    Alcohol/week: 0.0 standard drinks    Comment: rare alcohol   Drug use: No   Sexual activity: Not on file

## 2021-11-21 ENCOUNTER — Other Ambulatory Visit: Payer: Self-pay | Admitting: Family Medicine

## 2021-11-22 ENCOUNTER — Ambulatory Visit (INDEPENDENT_AMBULATORY_CARE_PROVIDER_SITE_OTHER): Payer: Medicare Other

## 2021-11-22 ENCOUNTER — Ambulatory Visit: Payer: Medicare Other

## 2021-11-22 ENCOUNTER — Other Ambulatory Visit: Payer: Self-pay

## 2021-11-22 DIAGNOSIS — Z0000 Encounter for general adult medical examination without abnormal findings: Secondary | ICD-10-CM | POA: Diagnosis not present

## 2021-11-22 NOTE — Patient Instructions (Addendum)
Antonio Serrano , Thank you for taking time to come for your Medicare Wellness Visit. I appreciate your ongoing commitment to your health goals. Please review the following plan we discussed and let me know if I can assist you in the future.   Screening recommendations/referrals: Colonoscopy: Done 01/20/15 pt declined further screening  Recommended yearly ophthalmology/optometry visit for glaucoma screening and checkup Recommended yearly dental visit for hygiene and checkup  Vaccinations: Influenza vaccine: Done 07/13/21 repeat every year  Pneumococcal vaccine: Up to date Tdap vaccine: Due and discussed  Shingles vaccine: Shingrix discussed. Please contact your pharmacy for coverage information.    Covid-19: Completed 2/8, 3/4, & 08/04/20  Advanced directives: Please bring a copy of your health care power of attorney and living will to the office at your convenience.  Conditions/risks identified: walk better   Next appointment: Follow up in one year for your annual wellness visit.   Preventive Care 41 Years and Older, Male Preventive care refers to lifestyle choices and visits with your health care provider that can promote health and wellness. What does preventive care include? A yearly physical exam. This is also called an annual well check. Dental exams once or twice a year. Routine eye exams. Ask your health care provider how often you should have your eyes checked. Personal lifestyle choices, including: Daily care of your teeth and gums. Regular physical activity. Eating a healthy diet. Avoiding tobacco and drug use. Limiting alcohol use. Practicing safe sex. Taking low doses of aspirin every day. Taking vitamin and mineral supplements as recommended by your health care provider. What happens during an annual well check? The services and screenings done by your health care provider during your annual well check will depend on your age, overall health, lifestyle risk factors, and  family history of disease. Counseling  Your health care provider may ask you questions about your: Alcohol use. Tobacco use. Drug use. Emotional well-being. Home and relationship well-being. Sexual activity. Eating habits. History of falls. Memory and ability to understand (cognition). Work and work Statistician. Screening  You may have the following tests or measurements: Height, weight, and BMI. Blood pressure. Lipid and cholesterol levels. These may be checked every 5 years, or more frequently if you are over 81 years old. Skin check. Lung cancer screening. You may have this screening every year starting at age 64 if you have a 30-pack-year history of smoking and currently smoke or have quit within the past 15 years. Fecal occult blood test (FOBT) of the stool. You may have this test every year starting at age 44. Flexible sigmoidoscopy or colonoscopy. You may have a sigmoidoscopy every 5 years or a colonoscopy every 10 years starting at age 34. Prostate cancer screening. Recommendations will vary depending on your family history and other risks. Hepatitis C blood test. Hepatitis B blood test. Sexually transmitted disease (STD) testing. Diabetes screening. This is done by checking your blood sugar (glucose) after you have not eaten for a while (fasting). You may have this done every 1-3 years. Abdominal aortic aneurysm (AAA) screening. You may need this if you are a current or former smoker. Osteoporosis. You may be screened starting at age 56 if you are at high risk. Talk with your health care provider about your test results, treatment options, and if necessary, the need for more tests. Vaccines  Your health care provider may recommend certain vaccines, such as: Influenza vaccine. This is recommended every year. Tetanus, diphtheria, and acellular pertussis (Tdap, Td) vaccine. You may need  a Td booster every 10 years. Zoster vaccine. You may need this after age 42. Pneumococcal  13-valent conjugate (PCV13) vaccine. One dose is recommended after age 31. Pneumococcal polysaccharide (PPSV23) vaccine. One dose is recommended after age 62. Talk to your health care provider about which screenings and vaccines you need and how often you need them. This information is not intended to replace advice given to you by your health care provider. Make sure you discuss any questions you have with your health care provider. Document Released: 10/14/2015 Document Revised: 06/06/2016 Document Reviewed: 07/19/2015 Elsevier Interactive Patient Education  2017 [place] Prevention in the Home Falls can cause injuries. They can happen to people of all ages. There are many things you can do to make your home safe and to help prevent falls. What can I do on the outside of my home? Regularly fix the edges of walkways and driveways and fix any cracks. Remove anything that might make you trip as you walk through a door, such as a raised step or threshold. Trim any bushes or trees on the path to your home. Use bright outdoor lighting. Clear any walking paths of anything that might make someone trip, such as rocks or tools. Regularly check to see if handrails are loose or broken. Make sure that both sides of any steps have handrails. Any raised decks and porches should have guardrails on the edges. Have any leaves, snow, or ice cleared regularly. Use sand or salt on walking paths during winter. Clean up any spills in your garage right away. This includes oil or grease spills. What can I do in the bathroom? Use night lights. Install grab bars by the toilet and in the tub and shower. Do not use towel bars as grab bars. Use non-skid mats or decals in the tub or shower. If you need to sit down in the shower, use a plastic, non-slip stool. Keep the floor dry. Clean up any water that spills on the floor as soon as it happens. Remove soap buildup in the tub or shower regularly. Attach  bath mats securely with double-sided non-slip rug tape. Do not have throw rugs and other things on the floor that can make you trip. What can I do in the bedroom? Use night lights. Make sure that you have a light by your bed that is easy to reach. Do not use any sheets or blankets that are too big for your bed. They should not hang down onto the floor. Have a firm chair that has side arms. You can use this for support while you get dressed. Do not have throw rugs and other things on the floor that can make you trip. What can I do in the kitchen? Clean up any spills right away. Avoid walking on wet floors. Keep items that you use a lot in easy-to-reach places. If you need to reach something above you, use a strong step stool that has a grab bar. Keep electrical cords out of the way. Do not use floor polish or wax that makes floors slippery. If you must use wax, use non-skid floor wax. Do not have throw rugs and other things on the floor that can make you trip. What can I do with my stairs? Do not leave any items on the stairs. Make sure that there are handrails on both sides of the stairs and use them. Fix handrails that are broken or loose. Make sure that handrails are as long as the stairways. Check  any carpeting to make sure that it is firmly attached to the stairs. Fix any carpet that is loose or worn. Avoid having throw rugs at the top or bottom of the stairs. If you do have throw rugs, attach them to the floor with carpet tape. Make sure that you have a light switch at the top of the stairs and the bottom of the stairs. If you do not have them, ask someone to add them for you. What else can I do to help prevent falls? Wear shoes that: Do not have high heels. Have rubber bottoms. Are comfortable and fit you well. Are closed at the toe. Do not wear sandals. If you use a stepladder: Make sure that it is fully opened. Do not climb a closed stepladder. Make sure that both sides of the  stepladder are locked into place. Ask someone to hold it for you, if possible. Clearly mark and make sure that you can see: Any grab bars or handrails. First and last steps. Where the edge of each step is. Use tools that help you move around (mobility aids) if they are needed. These include: Canes. Walkers. Scooters. Crutches. Turn on the lights when you go into a dark area. Replace any light bulbs as soon as they burn out. Set up your furniture so you have a clear path. Avoid moving your furniture around. If any of your floors are uneven, fix them. If there are any pets around you, be aware of where they are. Review your medicines with your doctor. Some medicines can make you feel dizzy. This can increase your chance of falling. Ask your doctor what other things that you can do to help prevent falls. This information is not intended to replace advice given to you by your health care provider. Make sure you discuss any questions you have with your health care provider. Document Released: 07/14/2009 Document Revised: 02/23/2016 Document Reviewed: 10/22/2014 Elsevier Interactive Patient Education  2017 [employer].

## 2021-11-22 NOTE — Progress Notes (Signed)
Virtual Visit via Telephone Note  I connected with  Antonio Serrano on 11/22/21 at 10:15 AM EST by telephone and verified that I am speaking with the correct person using two identifiers.  Medicare Annual Wellness visit completed telephonically due to Covid-19 pandemic.   Persons participating in this call: This Health Coach and this patient.   Location: Patient: Home Provider: Office   I discussed the limitations, risks, security and privacy concerns of performing an evaluation and management service by telephone and the availability of in person appointments. The patient expressed understanding and agreed to proceed.  Unable to perform video visit due to video visit attempted and failed and/or patient does not have video capability.   Some vital signs may be absent or patient reported.   Willette Brace, LPN   Subjective:   Antonio Serrano is a 76 y.o. male who presents for Medicare Annual/Subsequent preventive examination.  Review of Systems     Cardiac Risk Factors include: advanced age (>39men, >54 women);hypertension;diabetes mellitus;dyslipidemia;male gender;smoking/ tobacco exposure     Objective:    There were no vitals filed for this visit. There is no height or weight on file to calculate BMI.  Advanced Directives 11/22/2021 04/23/2021 11/16/2020 09/04/2020 05/15/2019 01/10/2018 09/19/2016  Does Patient Have a Medical Advance Directive? Yes Yes Yes Yes Yes Yes Yes  Type of Industrial/product designer of [employer] Power of [place];Living will - [place];Living will Living will;Healthcare Power of [place];Living will  Does patient want to make changes to medical advance directive? - No - Patient declined - - - - No - Patient declined  Copy of [place] in Chart? No - copy requested - No - copy requested - No - copy requested No - copy requested No - copy requested     Current Medications (verified) Outpatient Encounter Medications as of 11/22/2021  Medication Sig   acetaminophen (TYLENOL) 500 MG tablet Take 1 tablet (500 mg total) by mouth every 6 (six) hours as needed for mild pain or fever (pain score 1-3 or temp > 100.5).   carvedilol (COREG) 6.25 MG tablet Take 1 tablet (6.25 mg total) by mouth 2 (two) times daily with a meal.   esomeprazole (NEXIUM) 20 MG capsule Take 20 mg by mouth daily at 12 noon.   furosemide (LASIX) 40 MG tablet TAKE 1 TABLET BY MOUTH ONCE DAILY . APPOINTMENT REQUIRED FOR FUTURE REFILLS   ipratropium (ATROVENT) 0.03 % nasal spray USE 2 SPRAY(S) IN EACH NOSTRIL EVERY 12 HOURS   methocarbamol (ROBAXIN) 500 MG tablet 1 po q hs prn   pioglitazone (ACTOS) 15 MG tablet TAKE 1/2 TO 1 (ONE-HALF TO ONE) TABLET BY MOUTH ONCE DAILY   olmesartan (BENICAR) 20 MG tablet Take 1/2 (one-half) tablet by mouth once daily (Patient not taking: Reported on 07/13/2021)   [DISCONTINUED] esomeprazole (NEXIUM) 20 MG capsule Take 1 capsule (20 mg total) by mouth daily at 12 noon.   [DISCONTINUED] pioglitazone (ACTOS) 15 MG tablet TAKE 1/2 TO 1 (ONE-HALF TO ONE) TABLET BY MOUTH ONCE DAILY   No facility-administered encounter medications on file as of 11/22/2021.    Allergies (verified) Bactrim [sulfamethoxazole-trimethoprim]   History: Past Medical History:  Diagnosis Date   Atherosclerosis of coronary artery    On chest CT->calcif in LAD and RCA   Chronic prostatitis    Very mild elevation of PSA (>4), referred to Urol where PSA repeat was 1.0 on 06/16/13.  Annual PSA repeat is all that is needed now per urologist.  Most recent 07/2015 was 0.45.   Chronic renal insufficiency, stage III (moderate) (HCC)    Baseline GFR as of 2019= 50s.     Closed right hip fracture ([place]) 03/2021   THA   Diabetes mellitus with complication ([place]) Dx'd fall 2011   HTN (hypertension)    Renal/aortic doppler u/s normal 06/2010   Hypercalcemia    Hyperlipidemia 2016    Statin intolerant--myalgias.  Pt refuses any further trial of statin as of 2018.   Nonspecific abnormal electrocardiogram (ECG) (EKG) Fall 2011   TWI in inferior leads: myocardial perfusion scan neg and echo normal 07/2010   Tobacco dependence    UTI (lower urinary tract infection)    Feb 2012 (klebsiella--dx at [facility]); 03/2013 e coli.    Past Surgical History:  Procedure Laterality Date   CARDIOVASCULAR STRESS TEST  07/2010   Myocardial perfusion scan neg/low risk.   CATARACT EXTRACTION  2007, 2008   Bilateral ([facility] on [address].   COLONOSCOPY  12/20/04;12/2014   2016 no polyps: recall 5 yrs due to [place] of colon ca   ESOPHAGOGASTRODUODENOSCOPY  11/2004   esoph stricture/dilation   TOTAL HIP ARTHROPLASTY Right 04/23/2021   Procedure: TOTAL HIP ARTHROPLASTY ANTERIOR APPROACH;  Surgeon: Meredith Pel, MD;  Location: [facility];  Service: Orthopedics;  Laterality: Right;  Hana, C-arm, Depuy   TRANSTHORACIC ECHOCARDIOGRAM  07/2010   EF =>55%; LA mildly dilated; trace MR/TR;    Family History  Problem Relation Age of Onset   Dementia Mother    Pneumonia Father        died in 41's of pneumonia, was otherwise healthy   Cancer Sister        colon cancer.  Died age 35   Diabetes Paternal Aunt    Diabetes Sister    Colon cancer Neg Hx    Social History   Socioeconomic History   Marital status: Married    Spouse name: Not on file   Number of children: Not on file   Years of education: Not on file   Highest education level: Not on file  Occupational History   Not on file  Tobacco Use   Smoking status: Every Day    Packs/day: 0.25    Years: 50.00    Pack years: 12.50    Types: Cigarettes   Smokeless tobacco: Never  Vaping Use   Vaping Use: Never used  Substance and Sexual Activity   Alcohol use: Not Currently    Alcohol/week: 0.0 standard drinks    Comment: rare alcohol   Drug use: No   Sexual activity: Not on file  Other Topics Concern   Not on  file  Social History Narrative   Married, 4 grown children, 6 grandchildren.   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   Lives in [place].   25 pack-yr tobacco hx--current as of 05/2014.   Drinks about a six pack per week.  Enjoys golf--six handicap at one point.  No formal exercise.   Walks a lot for his job.         Social Determinants of Health   Financial Resource Strain: Low Risk    Difficulty of Paying Living Expenses: Not hard at all  Food Insecurity: No Food Insecurity   Worried About Charity fundraiser in the Last Year: Never true   [employer]  in the Last Year: Never true  Transportation Needs: No Transportation Needs   Lack of Transportation (Medical): No   Lack of Transportation (Non-Medical): No  Physical Activity: Inactive   Days of Exercise per Week: 0 days   Minutes of Exercise per Session: 0 min  Stress: No Stress Concern Present   Feeling of Stress : Not at all  Social Connections: Moderately Isolated   Frequency of Communication with Friends and Family: More than three times a week   Frequency of Social Gatherings with Friends and Family: Twice a week   Attends Religious Services: Never   Marine scientist or Organizations: No   Attends Music therapist: Never   Marital Status: Married    Tobacco Counseling Ready to quit: Not Answered Counseling given: Not Answered   Clinical Intake:  Pre-visit preparation completed: Yes  Pain : No/denies pain     BMI - recorded: 25.29 Nutritional Risks: None Diabetes: Yes CBG done?: Yes (93 pt stated) CBG resulted in Enter/ Edit results?: No Did pt. bring in CBG monitor from home?: No  How often do you need to have someone help you when you read instructions, pamphlets, or other written materials from your doctor or pharmacy?: 1 - Never  Diabetic?Nutrition Risk Assessment:  Has the patient had any N/V/D within the last 2  months?  No  Does the patient have any non-healing wounds?  No  Has the patient had any unintentional weight loss or weight gain?  No   Diabetes:  Is the patient diabetic?  Yes  If diabetic, was a CBG obtained today?  Yes  Did the patient bring in their glucometer from home?  No  How often do you monitor your CBG's? Daily.   Financial Strains and Diabetes Management:  Are you having any financial strains with the device, your supplies or your medication? No .  Does the patient want to be seen by Chronic Care Management for management of their diabetes?  No  Would the patient like to be referred to a Nutritionist or for Diabetic Management?  No   Diabetic Exams:  Diabetic Eye Exam: Overdue for diabetic eye exam. Pt has been advised about the importance in completing this exam. Patient advised to call and schedule an eye exam. Diabetic Foot Exam: Overdue, Pt has been advised about the importance in completing this exam. Pt is scheduled for diabetic foot exam on next appt.   Interpreter Needed?: No  Information entered by :: Charlott Rakes, LPN   Activities of Daily Living In your present state of health, do you have any difficulty performing the following activities: 11/22/2021 04/23/2021  Hearing? N N  Vision? N N  Difficulty concentrating or making decisions? N N  Walking or climbing stairs? Y N  Dressing or bathing? N N  Doing errands, shopping? N N  Preparing Food and eating ? Y -  Comment wife assist -  Using the Toilet? N -  In the past six months, have you accidently leaked urine? N -  Do you have problems with loss of bowel control? N -  Managing your Medications? N -  Managing your Finances? N -  Housekeeping or managing your Housekeeping? N -  Some recent data might be hidden    Patient Care Team: Tammi Sou, MD as PCP - General (Family Medicine) Chuck Hint (Dentistry) Macarthur Critchley, [place] as Consulting Physician (Optometry) Corliss Parish, MD as  Consulting Physician (Nephrology) [facility] [place], Dani Gobble, MD as  Consulting Physician (Cardiology)  Indicate any recent Medical Services you may have received from other than [facility] providers in the past year (date may be approximate).     Assessment:   This is a routine wellness examination for Antonio Serrano.  Hearing/Vision screen Hearing Screening - Comments:: Pt denies any hearing issues  Vision Screening - Comments:: Pt follows up with Dr Macarthur Critchley for annual eye exams  Dietary issues and exercise activities discussed: Current Exercise Habits: The patient does not participate in regular exercise at present   Goals Addressed             This Visit's Progress    Patient Stated       Walk better        Depression Screen PHQ 2/9 Scores 11/22/2021 09/13/2021 11/16/2020 09/16/2020 05/15/2019 10/17/2018 01/10/2018  PHQ - 2 Score 0 0 0 0 0 0 0  PHQ- 9 Score - - - - - - -    Fall Risk Fall Risk  11/22/2021 09/13/2021 11/16/2020 09/16/2020 05/15/2019  Falls in the past year? 1 1 1 1  0  Number falls in past yr: 1 1 0 0 0  Injury with Fall? 1 1 1 1  0  Comment hip injury - - - -  Risk for fall due to : Impaired vision;Impaired balance/gait;Impaired mobility - History of fall(s) - -  Follow up Falls prevention discussed Falls evaluation completed Falls prevention discussed Falls evaluation completed Falls prevention discussed    FALL RISK PREVENTION PERTAINING TO THE HOME:  Any stairs in or around the home? Yes  If so, are there any without handrails? No  Home free of loose throw rugs in walkways, pet beds, electrical cords, etc? Yes  Adequate lighting in your home to reduce risk of falls? Yes   ASSISTIVE DEVICES UTILIZED TO PREVENT FALLS:  Life alert? Yes  Use of a cane, walker or w/c? Yes  Grab bars in the bathroom? Yes  Shower chair or bench in shower? Yes  Elevated toilet seat or a handicapped toilet? No   TIMED UP AND GO:  Was the test performed? No .    Cognitive Function: MMSE - Mini Mental State Exam 01/10/2018  Orientation to time 5  Orientation to Place 5  Registration 3  Attention/ Calculation 5  Recall 3  Language- name 2 objects 2  Language- repeat 1  Language- follow 3 step command 3  Language- read & follow direction 1  Write a sentence 1  Copy design 1  Total score 30     6CIT Screen 11/22/2021  What Year? 0 points  What month? 0 points  What time? 0 points  Count back from 20 0 points  Months in reverse 0 points  Repeat phrase 6 points  Total Score 6    Immunizations Immunization History  Administered Date(s) Administered   Fluad Quad(high Dose 65+) 06/18/2019, 09/16/2020, 07/13/2021   Influenza, High Dose Seasonal PF 06/21/2017, 06/20/2018   Influenza,inj,Quad PF,6+ Mos 05/31/2014, 06/27/2015   Influenza-Unspecified 09/04/2016   PFIZER(Purple Top)SARS-COV-2 Vaccination 11/09/2019, 12/03/2019, 08/04/2020   Pneumococcal Conjugate-13 05/18/2014   Pneumococcal Polysaccharide-23 07/29/2015    TDAP status: Due, Education has been provided regarding the importance of this vaccine. Advised may receive this vaccine at local pharmacy or Health Dept. Aware to provide a copy of the vaccination record if obtained from local pharmacy or Health Dept. Verbalized acceptance and understanding.  Flu Vaccine status: Up to date  Pneumococcal vaccine status: Up to date  Covid-19 vaccine status: Completed  vaccines  Qualifies for Shingles Vaccine? Yes   Zostavax completed No   Shingrix Completed?: No.    Education has been provided regarding the importance of this vaccine. Patient has been advised to call insurance company to determine out of pocket expense if they have not yet received this vaccine. Advised may also receive vaccine at local pharmacy or Health Dept. Verbalized acceptance and understanding.  Screening Tests Health Maintenance  Topic Date Due   Zoster Vaccines- Shingrix (1 of 2) Never done   OPHTHALMOLOGY  EXAM  08/16/2018   TETANUS/TDAP  10/04/2019   COVID-19 Vaccine (4 - Booster for Pfizer series) 09/29/2020   FOOT EXAM  09/16/2021   COLONOSCOPY (Pts 45-57yrs Insurance coverage will need to be confirmed)  11/22/2022 (Originally 01/20/2020)   HEMOGLOBIN A1C  01/11/2022   Pneumonia Vaccine 32+ Years old  Completed   INFLUENZA VACCINE  Completed   Hepatitis C Screening  Completed   HPV VACCINES  Aged Out    Health Maintenance  Health Maintenance Due  Topic Date Due   Zoster Vaccines- Shingrix (1 of 2) Never done   OPHTHALMOLOGY EXAM  08/16/2018   TETANUS/TDAP  10/04/2019   COVID-19 Vaccine (4 - Booster for Pfizer series) 09/29/2020   FOOT EXAM  09/16/2021    Colorectal cancer screening: No longer required. Per pt    Additional Screening:  Hepatitis C Screening:  Completed 03/22/17  Vision Screening: Recommended annual ophthalmology exams for early detection of glaucoma and other disorders of the eye. Is the patient up to date with their annual eye exam?  Yes  Who is the provider or what is the name of the office in which the patient attends annual eye exams? Dr Macarthur Critchley If pt is not established with a provider, would they like to be referred to a provider to establish care? No .   Dental Screening: Recommended annual dental exams for proper oral hygiene  Community Resource Referral / Chronic Care Management: CRR required this visit?  No   CCM required this visit?  No      Plan:     I have personally reviewed and noted the following in the patients chart:   Medical and social history Use of alcohol, tobacco or illicit drugs  Current medications and supplements including opioid prescriptions. Patient is not currently taking opioid prescriptions. Functional ability and status Nutritional status Physical activity Advanced directives List of other physicians Hospitalizations, surgeries, and ER visits in previous 12 months Vitals Screenings to include cognitive,  depression, and falls Referrals and appointments  In addition, I have reviewed and discussed with patient certain preventive protocols, quality metrics, and best practice recommendations. A written personalized care plan for preventive services as well as general preventive health recommendations were provided to patient.     Willette Brace, LPN   [id number]   Nurse Notes: None

## 2021-11-28 ENCOUNTER — Other Ambulatory Visit: Payer: Self-pay | Admitting: Family Medicine

## 2021-12-04 ENCOUNTER — Telehealth: Payer: Self-pay | Admitting: Orthopedic Surgery

## 2021-12-04 NOTE — Telephone Encounter (Signed)
Patient called advised he can not put any weight down on his hip. Patient said he fell last week on his left side. Patient asked if he can get set up for an MRI? Patient said his hip is getting worse and not better. The number to contact patient is [phone] ?

## 2021-12-04 NOTE — Telephone Encounter (Signed)
IC worked patient in on Wednesday to have xrays and repeat exam since had recent fall.  ?

## 2021-12-06 ENCOUNTER — Ambulatory Visit: Payer: Medicare Other | Admitting: Surgical

## 2021-12-06 ENCOUNTER — Other Ambulatory Visit: Payer: Self-pay

## 2021-12-06 ENCOUNTER — Ambulatory Visit: Payer: Self-pay

## 2021-12-06 DIAGNOSIS — M79604 Pain in right leg: Secondary | ICD-10-CM

## 2021-12-06 DIAGNOSIS — W19XXXA Unspecified fall, initial encounter: Secondary | ICD-10-CM | POA: Diagnosis not present

## 2021-12-07 ENCOUNTER — Encounter: Payer: Self-pay | Admitting: Orthopedic Surgery

## 2021-12-07 NOTE — Progress Notes (Signed)
? ?Office Visit Note ?  ?Patient: Antonio Serrano           ?Date of Birth: [birth year]           ?MRN: [id number] ?Visit Date: 12/06/2021 ?Requested by: Tammi Sou, MD ?[phone] ?[place],  [place] [address] ?PCP: Tammi Sou, MD ? ?Subjective: ?Chief Complaint  ?Patient presents with  ? Right Leg - Pain  ? ? ?HPI: Antonio Serrano is a 76 y.o. male who presents to the office complaining of right hip pain.  Patient returns for reevaluation following last appointment in February.  He notes that he has had difficulty with his right hip following an incident in January where he felt a pop while getting out of bed.  His pain has been going downhill since then.  He initially was doing well following his right total hip arthroplasty on 04/23/2021.  He feels continually worsening pain that he localizes to the right lateral hip and buttocks with associated groin pain.  He has radicular pain that travels down the posterior lateral aspect of the thigh to the lateral aspect of the knee.  He cannot sleep on his right side due to severe pain.  He had a another fall where his leg gave out onto him and he fell onto his left leg and since then he has not been able to put any weight on his right leg.  He denies any change in the appearance of the incision, fevers, chills, drainage.  He has to pee in a jug since he cannot ambulate to the bathroom. ?             ?ROS: All systems reviewed are negative as they relate to the chief complaint within the history of present illness.  Patient denies fevers or chills. ? ?Assessment & Plan: ?Visit Diagnoses:  ?1. Pain in right leg   ?2. Fall, initial encounter   ? ? ?Plan: Patient is a 76 year old male who presents for repeat evaluation of right hip pain.  He has groin pain and lateral hip/buttock pain that radiates down his leg to his knee.  He now has inability to bear weight on his right lower extremity.  He has had worsening of his symptoms since seeing Dr. Marlou Sa in February.   At this point, he cannot live his life and he has failed conservative management of this hip pain.  Plan to proceed with CT scan of the right hip as well as MRI of the lumbar spine due to his subjective weakness with the leg giving out, radicular pain, spondylolisthesis noted on previous radiographs.  Follow-up after scans to review results. ? ?Follow-Up Instructions: No follow-ups on file.  ? ?Orders:  ?Orders Placed This Encounter  ?Procedures  ? XR HIP UNILAT W OR W/O PELVIS 2-3 VIEWS RIGHT  ? MR Lumbar Spine w/o contrast  ? CT HIP RIGHT WO CONTRAST  ? ?No orders of the defined types were placed in this encounter. ? ? ? ? Procedures: ?No procedures performed ? ? ?Clinical Data: ?No additional findings. ? ?Objective: ?Vital Signs: There were no vitals taken for this visit. ? ?Physical Exam:  ?Constitutional: Patient appears well-developed ?HEENT:  ?Head: Normocephalic ?Eyes:EOM are normal ?Neck: Normal range of motion ?Cardiovascular: Normal rate ?Pulmonary/chest: Effort normal ?Neurologic: Patient is alert ?Skin: Skin is warm ?Psychiatric: Patient has normal mood and affect ? ?Ortho Exam: Ortho exam demonstrates incision that is well-healed from prior right total hip arthroplasty.  No surrounding erythema.  No evidence of dehiscence or infection.  There is no sinus tract noted.  He has no pain with hip range of motion until the terminal internal rotation and then he has moderate to severe pain.  Intact hip flexion, quadricep, hamstring, dorsiflexion, plantarflexion strength rated 5/5.  Positive straight leg raise on right.  Negative straight leg raise on left.  He has point tenderness over the greater trochanter of the right hip but none over the left hip.  Intact abduction strength bilaterally. ? ?Specialty Comments:  ?No specialty comments available. ? ?Imaging: ?No results found. ? ? ?PMFS History: ?Patient Active Problem List  ? Diagnosis Date Noted  ? Closed right hip fracture, initial encounter ([place])  04/23/2021  ? Diabetes mellitus with complication ([place]) 58/85/0277  ? Hyperlipidemia 07/29/2015  ? Conjunctivitis 06/27/2015  ? Healthcare maintenance 06/27/2015  ? Type 2 diabetes mellitus with stage 3a chronic kidney disease, without long-term current use of insulin ([place]) 07/16/2014  ? Constipation, chronic 07/16/2014  ? Preventative health care 05/18/2014  ? Testicular pain, right 06/25/2013  ? Erectile dysfunction 05/31/2013  ? Chronic renal insufficiency, stage III (moderate) (HCC)   ? HTN (hypertension), benign 01/30/2012  ? Tobacco dependence 01/30/2012  ? ?Past Medical History:  ?Diagnosis Date  ? Atherosclerosis of coronary artery   ? On chest CT->calcif in LAD and RCA  ? Chronic prostatitis   ? Very mild elevation of PSA (>4), referred to Urol where PSA repeat was 1.0 on 06/16/13.  Annual PSA repeat is all that is needed now per urologist.  Most recent 07/2015 was 0.45.  ? Chronic renal insufficiency, stage III (moderate) (HCC)   ? Baseline GFR as of 2019= 50s.    ? Closed right hip fracture ([place]) 03/2021  ? THA  ? Diabetes mellitus with complication ([place]) Dx'd fall 2011  ? HTN (hypertension)   ? Renal/aortic doppler u/s normal 06/2010  ? Hypercalcemia   ? Hyperlipidemia 2016  ? Statin intolerant--myalgias.  Pt refuses any further trial of statin as of 2018.  ? Nonspecific abnormal electrocardiogram (ECG) (EKG) Fall 2011  ? TWI in inferior leads: myocardial perfusion scan neg and echo normal 07/2010  ? Tobacco dependence   ? UTI (lower urinary tract infection)   ? Feb 2012 (klebsiella--dx at [facility]); 03/2013 e coli.   ?  ?Family History  ?Problem Relation Age of Onset  ? Dementia Mother   ? Pneumonia Father   ?     died in 42's of pneumonia, was otherwise healthy  ? Cancer Sister   ?     colon cancer.  Died age 38  ? Diabetes Paternal Aunt   ? Diabetes Sister   ? Colon cancer Neg Hx   ?  ?Past Surgical History:  ?Procedure Laterality Date  ? CARDIOVASCULAR STRESS TEST  07/2010  ? Myocardial perfusion scan  neg/low risk.  ? CATARACT EXTRACTION  2007, 2008  ? Bilateral ([facility] on [address].  ? COLONOSCOPY  12/20/04;12/2014  ? 2016 no polyps: recall 5 yrs due to [place] of colon ca  ? ESOPHAGOGASTRODUODENOSCOPY  11/2004  ? esoph stricture/dilation  ? TOTAL HIP ARTHROPLASTY Right 04/23/2021  ? Procedure: TOTAL HIP ARTHROPLASTY ANTERIOR APPROACH;  Surgeon: Meredith Pel, MD;  Location: [facility];  Service: Orthopedics;  Laterality: Right;  Hana, C-arm, Depuy  ? TRANSTHORACIC ECHOCARDIOGRAM  07/2010  ? EF =>55%; LA mildly dilated; trace MR/TR;   ? ?Social History  ? ?Occupational History  ? Not on file  ?Tobacco Use  ?  Smoking status: Every Day  ?  Packs/day: 0.25  ?  Years: 50.00  ?  Pack years: 12.50  ?  Types: Cigarettes  ? Smokeless tobacco: Never  ?Vaping Use  ? Vaping Use: Never used  ?Substance and Sexual Activity  ? Alcohol use: Not Currently  ?  Alcohol/week: 0.0 standard drinks  ?  Comment: rare alcohol  ? Drug use: No  ? Sexual activity: Not on file  ? ? ? ? ?  ?

## 2021-12-12 ENCOUNTER — Ambulatory Visit
Admission: RE | Admit: 2021-12-12 | Discharge: 2021-12-12 | Disposition: A | Discharge status: Home / Self Care | Payer: Medicare Other | Source: Ambulatory Visit | Attending: Surgical | Admitting: Surgical

## 2021-12-12 ENCOUNTER — Other Ambulatory Visit: Payer: Self-pay

## 2021-12-12 DIAGNOSIS — W19XXXA Unspecified fall, initial encounter: Secondary | ICD-10-CM

## 2021-12-12 DIAGNOSIS — M79604 Pain in right leg: Secondary | ICD-10-CM

## 2021-12-25 ENCOUNTER — Other Ambulatory Visit: Payer: Self-pay | Admitting: Family Medicine

## 2021-12-28 ENCOUNTER — Ambulatory Visit
Admission: RE | Admit: 2021-12-28 | Discharge: 2021-12-28 | Disposition: A | Discharge status: Home / Self Care | Payer: Medicare Other | Source: Ambulatory Visit | Attending: Surgical | Admitting: Surgical

## 2021-12-28 ENCOUNTER — Telehealth: Payer: Self-pay | Admitting: Family Medicine

## 2021-12-28 DIAGNOSIS — M79604 Pain in right leg: Secondary | ICD-10-CM | POA: Diagnosis not present

## 2021-12-28 DIAGNOSIS — M2578 Osteophyte, vertebrae: Secondary | ICD-10-CM | POA: Diagnosis not present

## 2021-12-28 DIAGNOSIS — W19XXXA Unspecified fall, initial encounter: Secondary | ICD-10-CM

## 2021-12-28 DIAGNOSIS — R600 Localized edema: Secondary | ICD-10-CM | POA: Diagnosis not present

## 2021-12-28 DIAGNOSIS — M1611 Unilateral primary osteoarthritis, right hip: Secondary | ICD-10-CM | POA: Diagnosis not present

## 2021-12-28 DIAGNOSIS — M545 Low back pain, unspecified: Secondary | ICD-10-CM | POA: Diagnosis not present

## 2021-12-28 IMAGING — CT CT HIP*R* W/O CM
1 of 2 series · 13 of 32 positions shown, 18 images · non-contrast
Comparison: Bilateral hip radiographs [DATE], [DATE],
[DATE]

CLINICAL DATA: Right hip pain. Evaluate for source of right hip
pain status post fall. History of total right hip arthroplasty
[DATE]. Right hip pain since total hip replacement. No new
injury.

EXAM:
CT OF THE RIGHT HIP WITHOUT CONTRAST
TECHNIQUE: Multidetector CT imaging of the right hip was performed according to
the standard protocol. Multiplanar CT image reconstructions were
also generated.
RADIATION DOSE REDUCTION: This exam was performed according to the
departmental dose-optimization program which includes automated
exposure control, adjustment of the mA and/or kV according to
patient size and/or use of iterative reconstruction technique.

[Series 3: soft tissue pelvis/hip · axial · 0.45mm/px · z∈[+511,+793]mm · 13 of 110 slices shown, 18 images]
[im 8/110  soft-tissue]
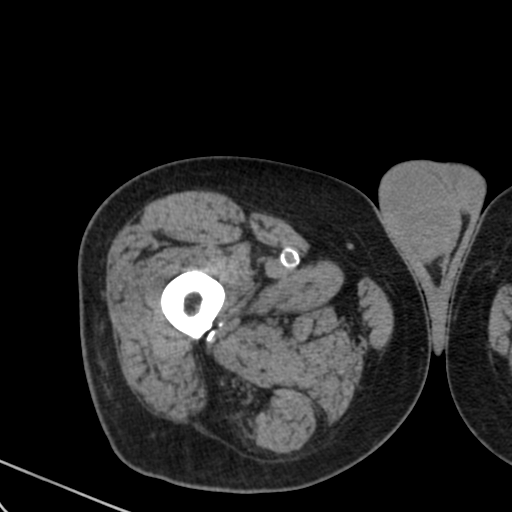
[im 8/110  bone]
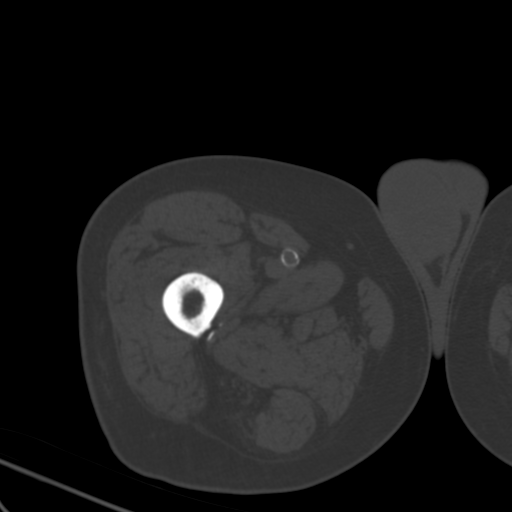
[im 15/110  soft-tissue]
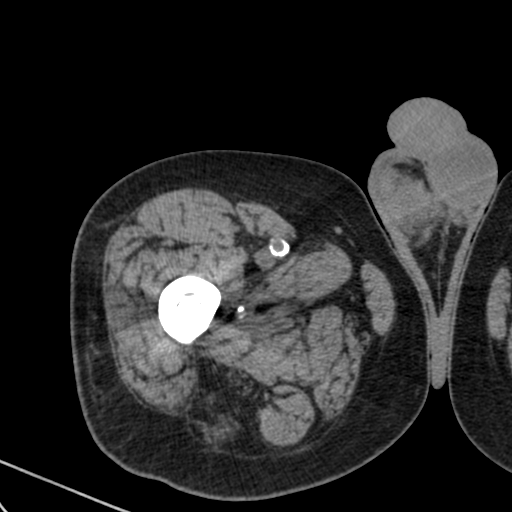
[im 22/110  soft-tissue]
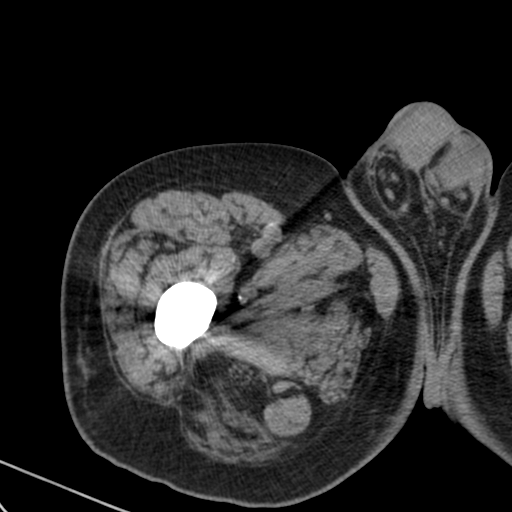
[im 37/110  soft-tissue]
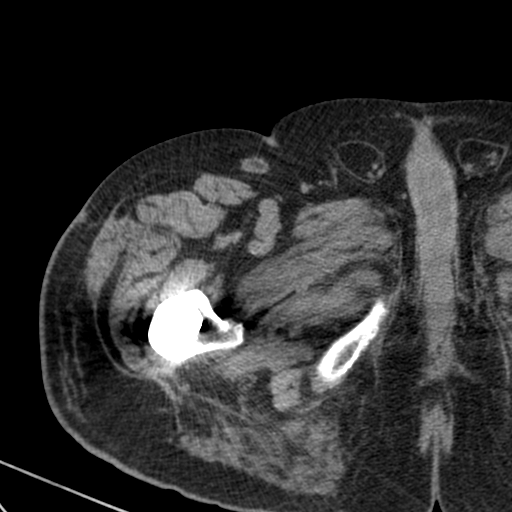
[im 44/110  soft-tissue]
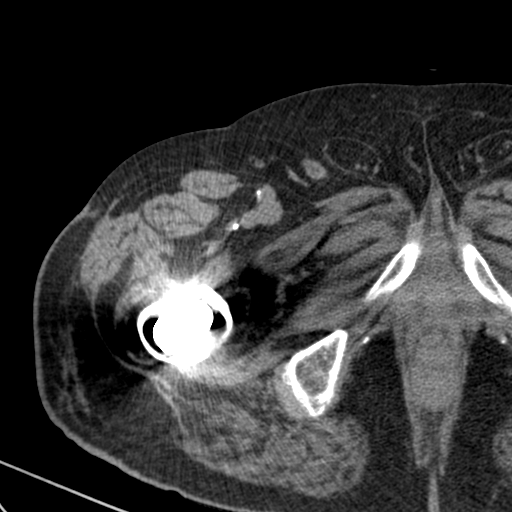
[im 51/110  soft-tissue]
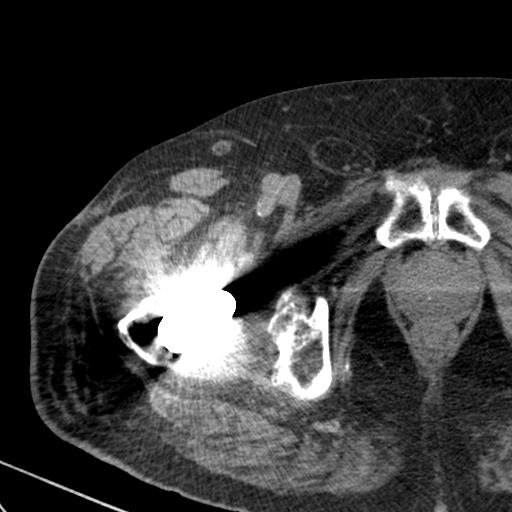
[im 59/110  soft-tissue]
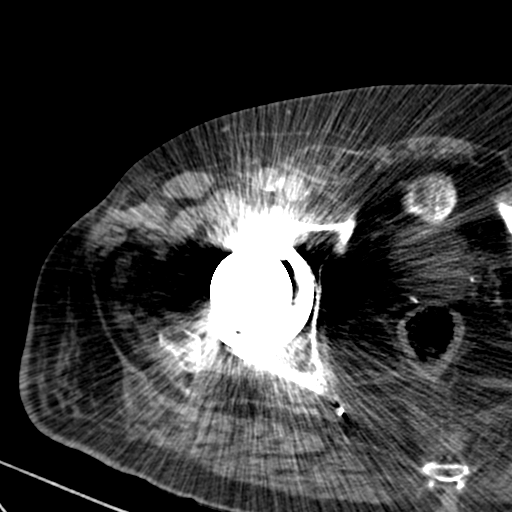
[im 66/110  soft-tissue]
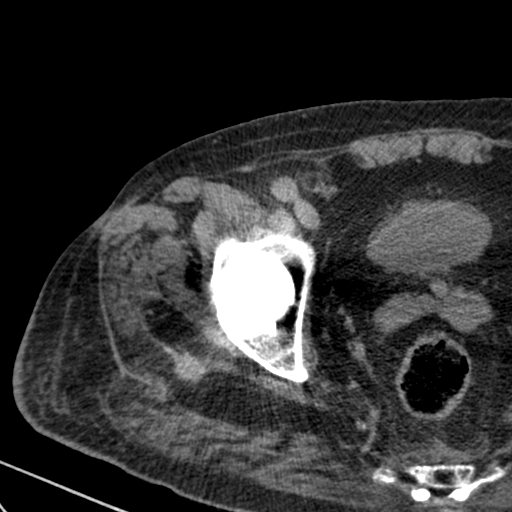
[im 73/110  soft-tissue]
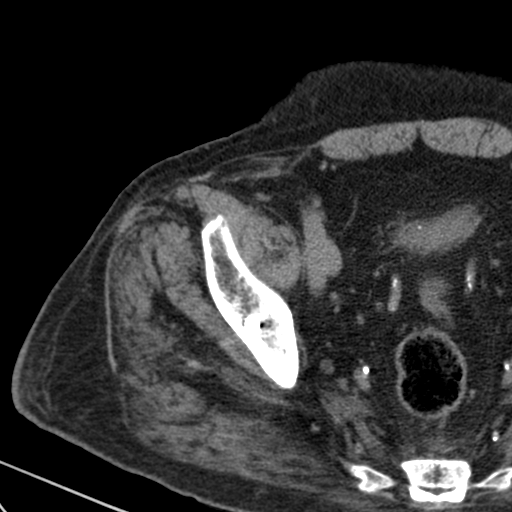
[im 73/110  bone]
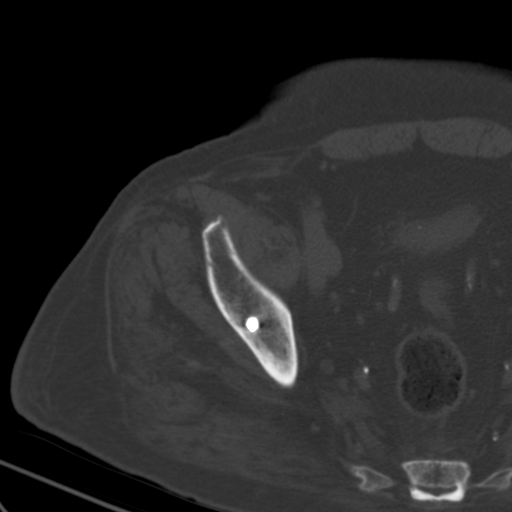
[im 80/110  lung]
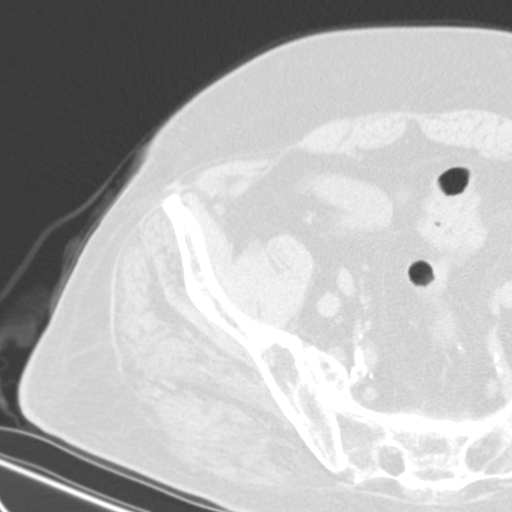
[im 88/110  soft-tissue]
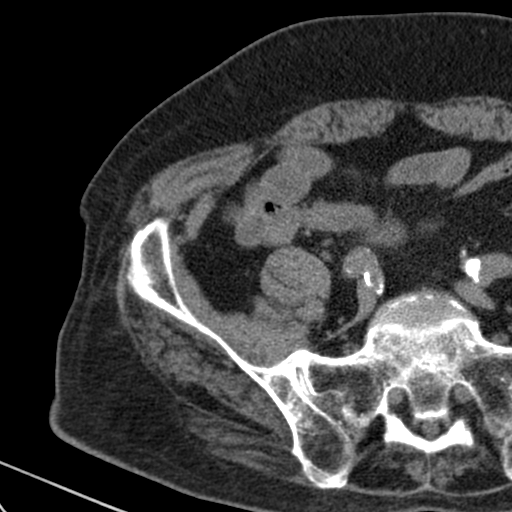
[im 88/110  lung]
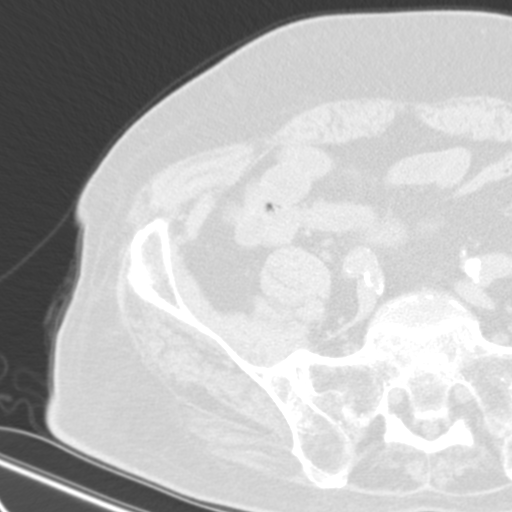
[im 95/110  soft-tissue]
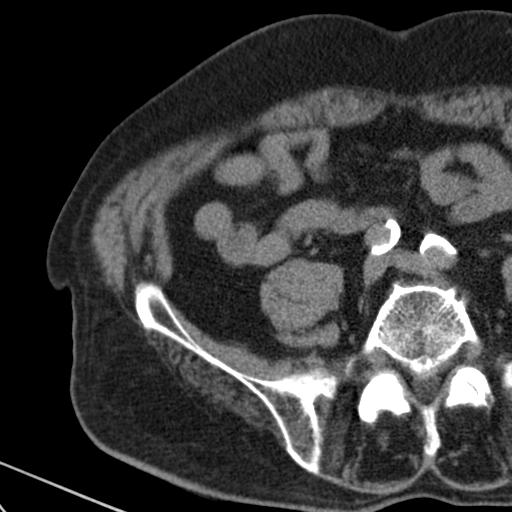
[im 95/110  lung]
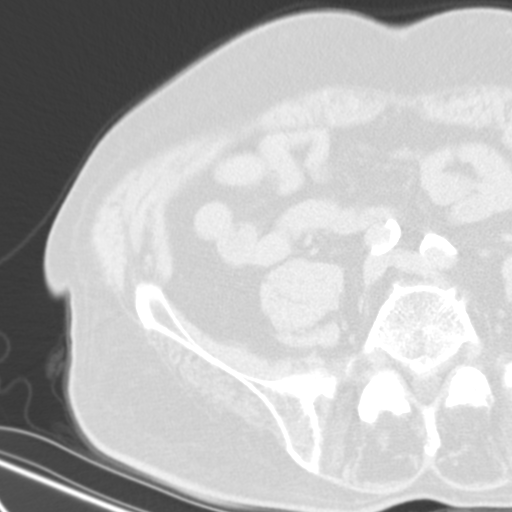
[im 102/110  soft-tissue]
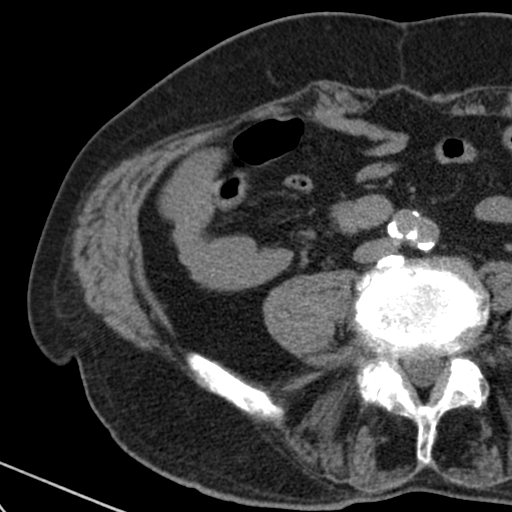
[im 102/110  lung]
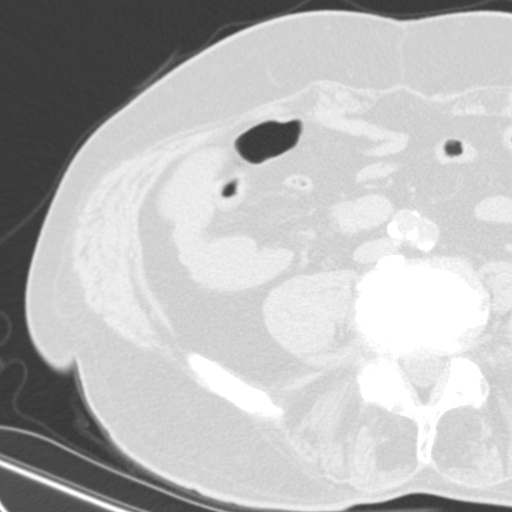

[13 of 32 positions shown; findings below may reference images not displayed]

FINDINGS: Bones/Joint/Cartilage

Postsurgical changes are again seen of total right hip arthroplasty.
Within the limitations of metallic streak artifact, no perihardware
lucency is seen to indicate hardware failure or loosening. No acute
fracture is seen. There is a chronic well corticated ossicle
measuring up to 3.0 cm superior to the posterior aspect of the right
greater trochanter, along the posterior aspect of the right gluteus
medius tendon insertion and likely remote avulsion injury.

Moderate to high-grade superior pubic symphysis joint space
narrowing with complete bridging superior osteophytosis (coronal
series 6, image 27).

Moderate to severe posterior right right-greater-than-left L4-5 disc
space narrowing. 3 mm grade 1 anterolisthesis of L4 on L5. Moderate
to large anterior L4-5 endplate osteophytes. Mild posterior L5-S1
disc space narrowing with mild-to-moderate posterior disc bulge.

Ligaments

Suboptimally assessed by CT.

Muscles and Tendons

There is mild-to-moderate gluteus medius, mild gluteus minimus and
mild-to-moderate gluteus maximus fatty infiltration.

Soft tissues

Mild posterolateral hip subcutaneous fat edema. Moderate to
high-grade atherosclerotic vascular calcifications. The prostate
measures up to 4.4 cm in transverse dimension, mildly enlarged.
IMPRESSION: :
IMPRESSION: 1. Status post total right hip arthroplasty. No evidence of hardware
failure.
2. Degenerative changes of the pubic symphysis with complete
superior bony bridging.
3. Mild-to-moderate right gluteus medius and mild gluteus minimus
fatty infiltration.
4. Likely old avulsion injury of the posterior gluteus medius tendon
insertion with unchanged, well corticated chronic ossicle.

## 2021-12-28 IMAGING — MR MR LUMBAR SPINE W/O CM
4 of 5 series · 18 of 48 positions shown · non-contrast
Comparison: Radiographs of the lumbar spine [DATE].

CLINICAL DATA: Provided history: Pain in right leg. Fall, initial
encounter. Evaluate for source of right radicular leg pain.
Additional history provided by scanning technologist: Patient
reports bilateral low back pain, chronic.

EXAM:
MRI LUMBAR SPINE WITHOUT CONTRAST
TECHNIQUE: Multiplanar, multisequence MR imaging of the lumbar spine was
performed. No intravenous contrast was administered.

[Series 5: T2 · sagittal · 4.0mm · 0.78mm/px · 8 of 15 slices shown (1 of 2)]
[im 1/15]
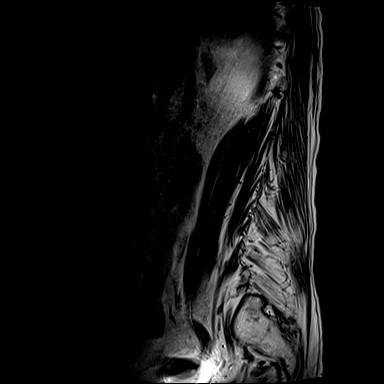
[im 3/15]
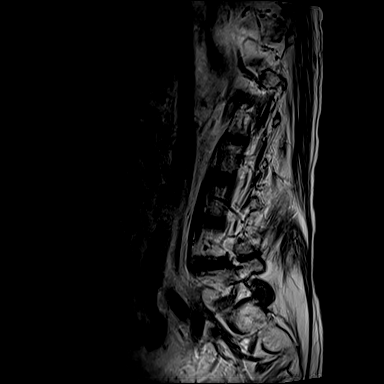
[im 5/15]
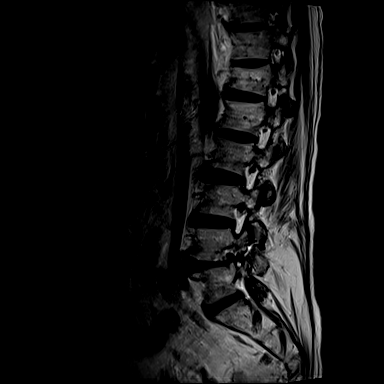
[im 7/15]
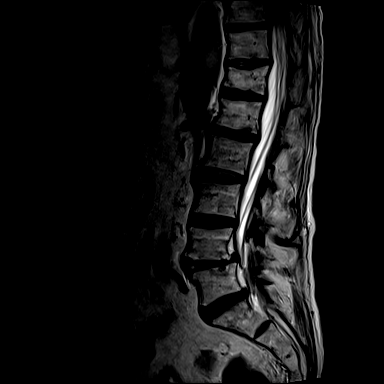
[im 9/15]
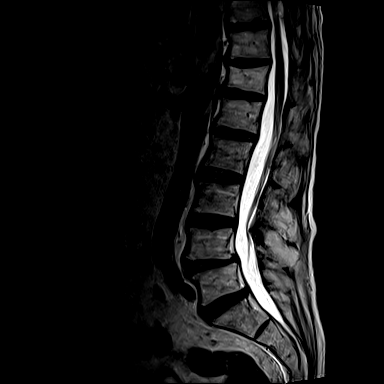
[im 11/15]
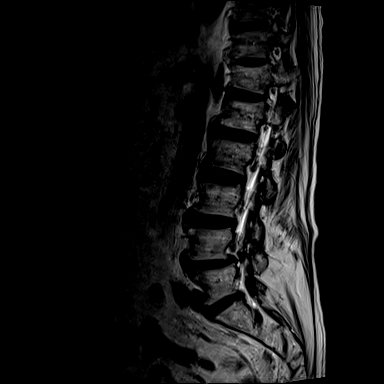
[im 13/15]
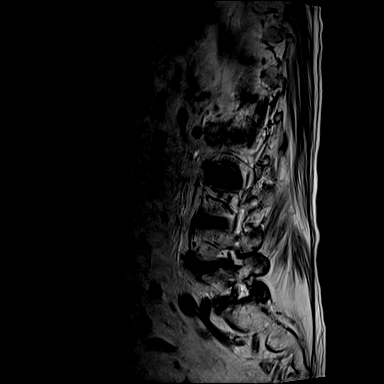
[im 15/15]
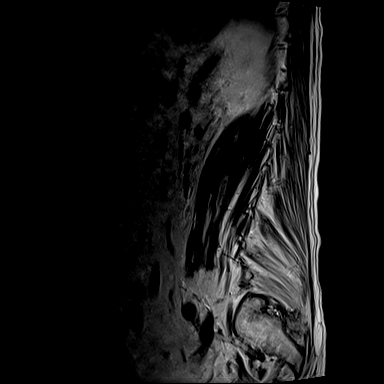

[Series 6: T1 · sagittal · 4.0mm · 0.73mm/px · 3 of 15 slices shown (1 of 2)]
[im 3/15]
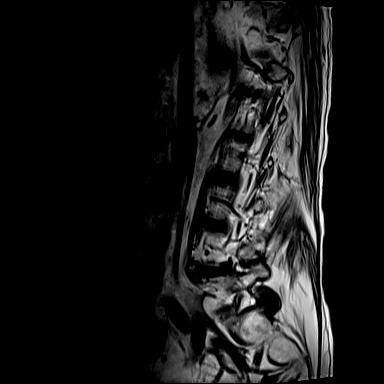
[im 9/15]
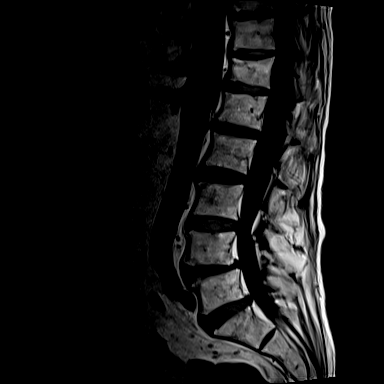
[im 13/15]
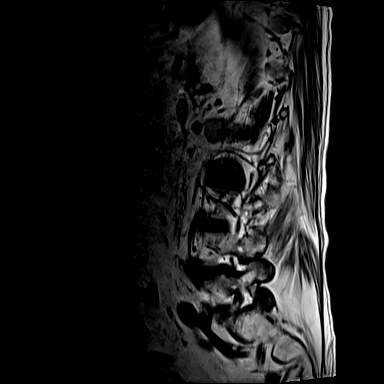

[Series 12: T2 · axial · 4.0mm · 0.28mm/px · z∈[-14,+187]mm · 6 of 45 slices shown (2 of 2)]
[im 3/45]
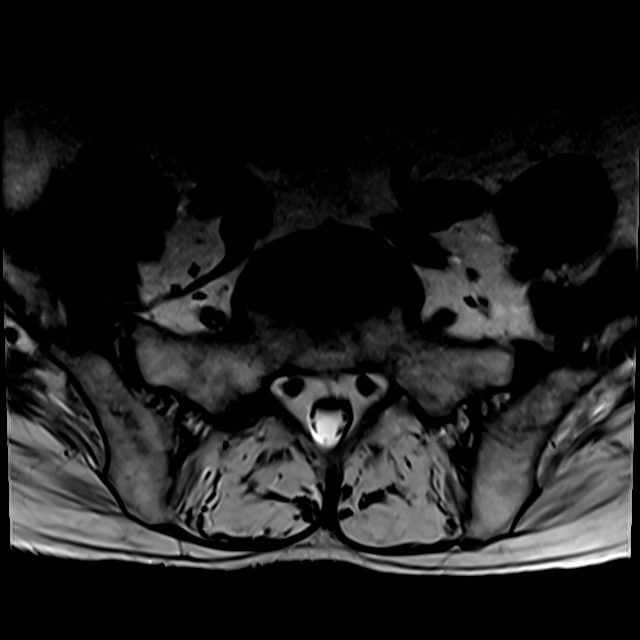
[im 7/45]
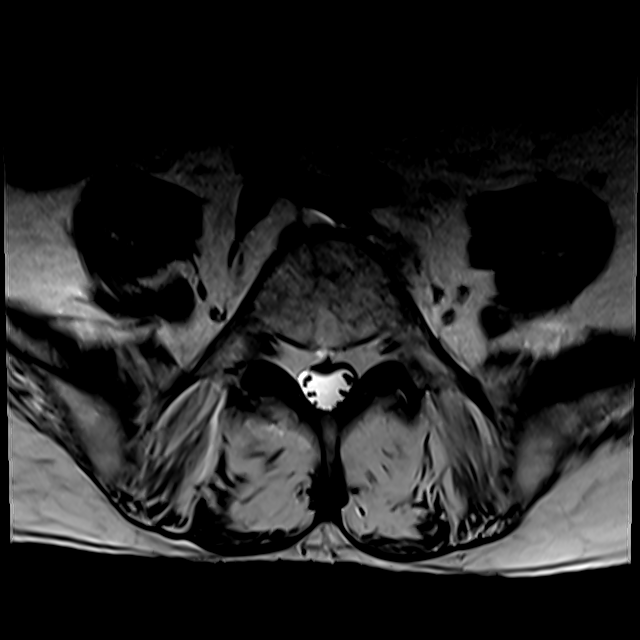
[im 9/45]
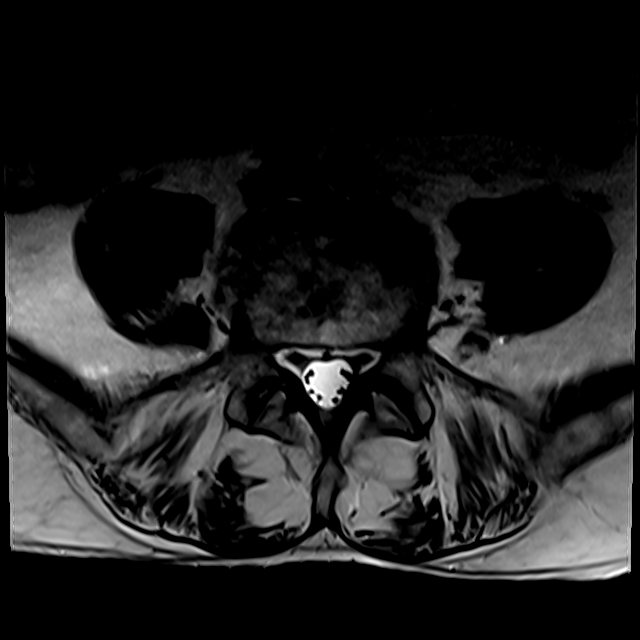
[im 15/45]
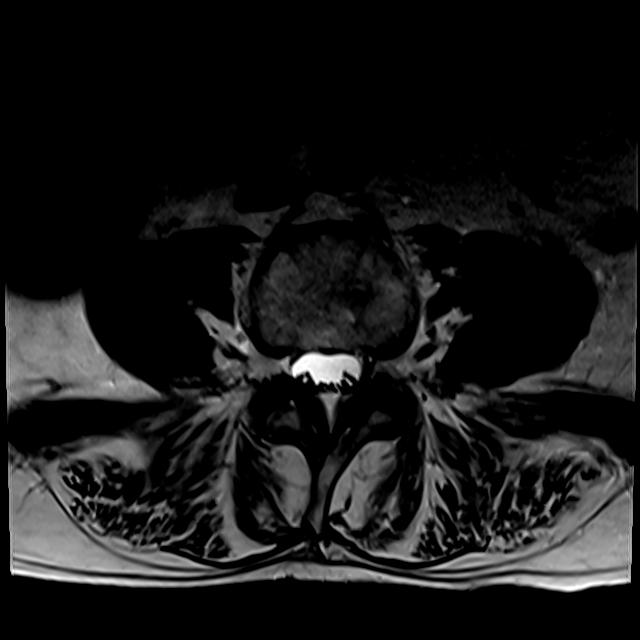
[im 23/45]
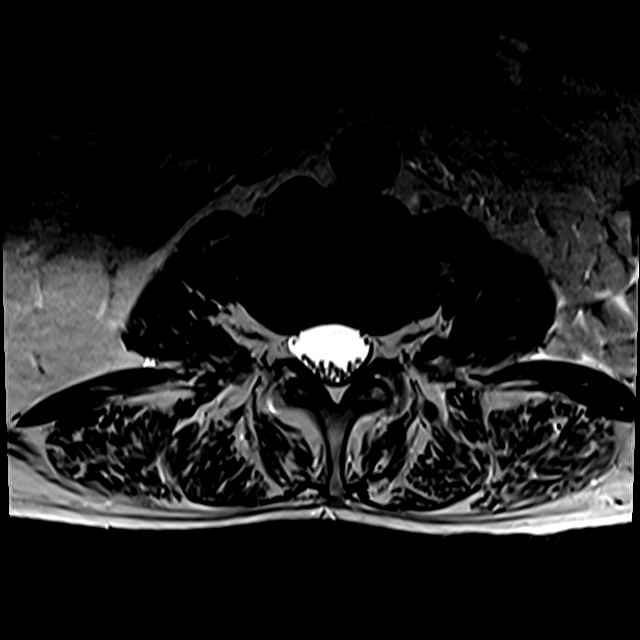
[im 39/45]
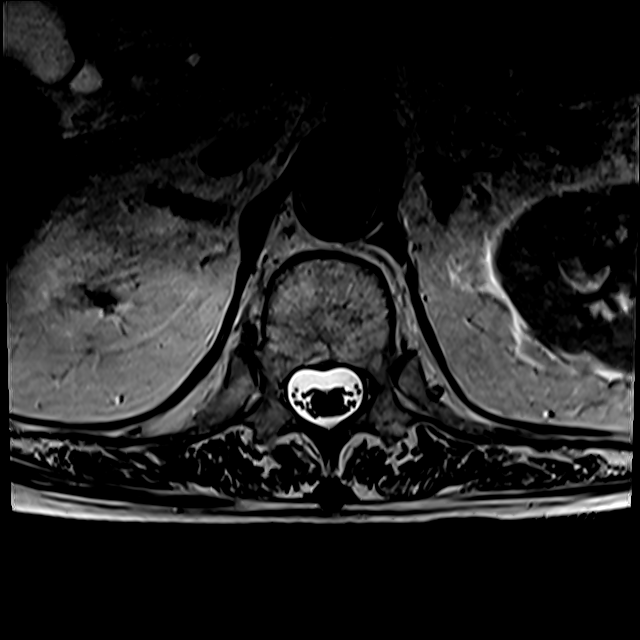

[Series 100: T1 · axial · 4.0mm · 0.28mm/px · 1 of 2 slices shown (2 of 2)]
[im 1/2]
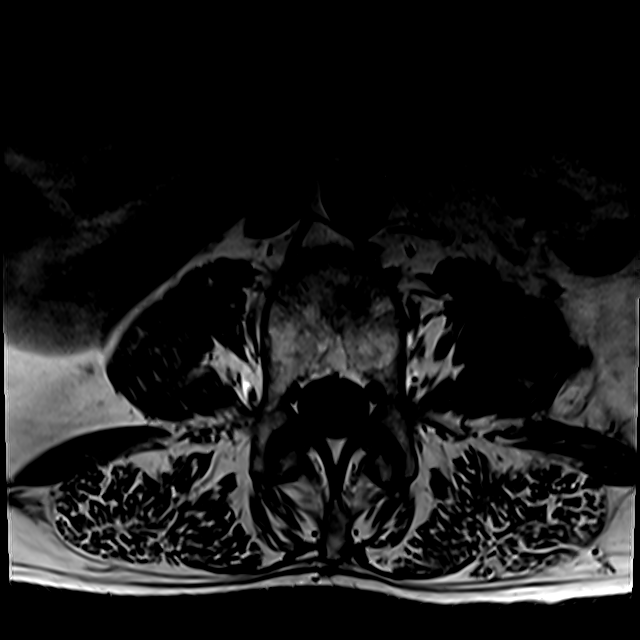

[18 of 48 positions shown; findings below may reference images not displayed]

FINDINGS: Segmentation: 5 lumbar vertebrae. The caudal most well-formed
intervertebral disc space is designated L5-S1.

Alignment: Trace grade 1 retrolisthesis at L1-L2. 3 mm L4-L5 grade 1
anterolisthesis. Trace trace L5-S1 grade 1 anterolisthesis.

Vertebrae: Redemonstrated mild chronic T12 anterior wedge vertebral
compression fracture. Vertebral body height is otherwise maintained.
Mild multilevel degenerative endplate irregularity with small
Schmorl nodes. Mild degenerative endplate edema at T11-T12, T12-L1
and L4-L5. Multilevel ventrolateral osteophytes. Chronic bilateral
L4 pars interarticularis defects.

Conus medullaris and cauda equina: Conus extends to the L1-L2 level.
No signal abnormality within the visualized distal spinal cord.

Paraspinal and other soft tissues: Redemonstrated atrophic right
kidney. Multiple incompletely imaged bilateral renal cysts. Atrophy
of the lumbar paraspinal musculature.

Disc levels:

Multilevel disc degeneration. Most notably, disc degeneration is
moderate/advanced at L4-L5.

T10-T11: This level is imaged in the sagittal plane only. Slight
disc bulge. No significant spinal canal or foraminal stenosis.

T11-T12: No significant disc herniation or stenosis.

T12-L1: Mild facet arthrosis. No significant disc herniation or
stenosis.

L1-L2: Trace grade 1 retrolisthesis. Very shallow broad-based right
center/subarticular disc protrusion with associated endplate
spurring. Minimal relative right subarticular narrowing (without
nerve root impingement). No significant central canal stenosis or
neural foraminal narrowing.

L2-L3: Mild facet arthrosis. No significant disc herniation or
stenosis.

L3-L4: Slight disc bulge. Mild facet arthrosis and ligamentum flavum
hypertrophy. No significant spinal canal or foraminal stenosis.

L4-L5: Chronic bilateral L4 pars interarticularis defects. 3 mm
grade 1 anterolisthesis. Disc uncovering with disc bulge and
endplate spurring. Mild facet arthrosis. No significant spinal canal
stenosis. Bilateral neural foraminal narrowing (moderate/severe
right, mild to moderate left).

L5-S1: Trace 2 mm grade 1 anterolisthesis. Small broad-based central
disc protrusion. Mild facet arthrosis. The disc protrusion results
in mild bilateral subarticular narrowing with possible subtle
contact upon the bilateral descending S1 nerve roots (series 12,
image 40). No significant foraminal stenosis.
IMPRESSION: Lumbar and lower thoracic spondylosis, as outlined and with findings
most notably as follows.

At L4-L5, there is moderate/advanced disc degeneration. Chronic
bilateral L4 pars interarticularis defects with 3 mm grade 1
anterolisthesis. Disc uncovering with disc bulge and endplate
spurring. Mild facet arthrosis. Bilateral neural foraminal narrowing
(moderate/severe right, mild-to-moderate left). Correlate for right
L4 radiculopathy. No significant spinal canal stenosis.

At L5-S1, there is trace grade 1 anterolisthesis. Small broad-based
central disc protrusion. Mild facet arthrosis. The disc protrusion
results in mild bilateral subarticular narrowing, and may subtly
contact the bilateral descending S1 nerve roots. No significant
central canal stenosis or neural foraminal narrowing.

Redemonstrated mild chronic T12 vertebral compression fracture.

Mild degenerative endplate edema at T11-T12, T12-L1 and L4-L5.

## 2021-12-28 MED ORDER — PIOGLITAZONE HCL 15 MG PO TABS: ORAL_TABLET | ORAL | 0 refills | Status: DC

## 2021-12-28 NOTE — Telephone Encounter (Signed)
Med refill on medication ? ?pioglitazone ?pioglitazone (ACTOS) 15 MG tablet ? ? ? ?[facility]), [address] Phone:  [phone]  ?Fax:  [phone]  ?  ? ? ?Pt said they wouldn't fill out prescription, informed pt we haven't seen him since December. ?Scheduled pt for OV. ? ? ? ?

## 2021-12-28 NOTE — Telephone Encounter (Signed)
Pt advised refill sent, temporary refill given until appt. ?

## 2022-01-01 ENCOUNTER — Ambulatory Visit (INDEPENDENT_AMBULATORY_CARE_PROVIDER_SITE_OTHER): Payer: Medicare Other | Admitting: Orthopedic Surgery

## 2022-01-01 DIAGNOSIS — M545 Low back pain, unspecified: Secondary | ICD-10-CM

## 2022-01-01 MED ORDER — GABAPENTIN 100 MG PO CAPS: 100.0000 mg | ORAL_CAPSULE | Freq: Two times a day (BID) | ORAL | 0 refills | Status: DC

## 2022-01-01 MED ORDER — TRAMADOL HCL 50 MG PO TABS: 50.0000 mg | ORAL_TABLET | Freq: Three times a day (TID) | ORAL | 0 refills | Status: DC | PRN

## 2022-01-04 ENCOUNTER — Emergency Department (HOSPITAL_COMMUNITY): Payer: Medicare Other

## 2022-01-04 ENCOUNTER — Observation Stay (HOSPITAL_COMMUNITY)
Admission: EM | Admit: 2022-01-04 | Discharge: 2022-01-05 | Disposition: A | Discharge status: Home Health | Payer: Medicare Other | Attending: Internal Medicine | Admitting: Internal Medicine

## 2022-01-04 ENCOUNTER — Ambulatory Visit: Payer: Medicare Other | Admitting: Family Medicine

## 2022-01-04 ENCOUNTER — Encounter: Payer: Self-pay | Admitting: Orthopedic Surgery

## 2022-01-04 ENCOUNTER — Other Ambulatory Visit: Payer: Self-pay

## 2022-01-04 ENCOUNTER — Observation Stay (HOSPITAL_COMMUNITY): Payer: Medicare Other

## 2022-01-04 ENCOUNTER — Telehealth: Payer: Self-pay | Admitting: Family Medicine

## 2022-01-04 DIAGNOSIS — E785 Hyperlipidemia, unspecified: Secondary | ICD-10-CM | POA: Diagnosis present

## 2022-01-04 DIAGNOSIS — I129 Hypertensive chronic kidney disease with stage 1 through stage 4 chronic kidney disease, or unspecified chronic kidney disease: Secondary | ICD-10-CM | POA: Diagnosis not present

## 2022-01-04 DIAGNOSIS — I2510 Atherosclerotic heart disease of native coronary artery without angina pectoris: Secondary | ICD-10-CM | POA: Diagnosis not present

## 2022-01-04 DIAGNOSIS — I6381 Other cerebral infarction due to occlusion or stenosis of small artery: Secondary | ICD-10-CM | POA: Diagnosis not present

## 2022-01-04 DIAGNOSIS — Z20822 Contact with and (suspected) exposure to covid-19: Secondary | ICD-10-CM | POA: Diagnosis not present

## 2022-01-04 DIAGNOSIS — Z7982 Long term (current) use of aspirin: Secondary | ICD-10-CM | POA: Diagnosis not present

## 2022-01-04 DIAGNOSIS — G459 Transient cerebral ischemic attack, unspecified: Secondary | ICD-10-CM | POA: Diagnosis not present

## 2022-01-04 DIAGNOSIS — R42 Dizziness and giddiness: Secondary | ICD-10-CM | POA: Diagnosis not present

## 2022-01-04 DIAGNOSIS — I639 Cerebral infarction, unspecified: Secondary | ICD-10-CM

## 2022-01-04 DIAGNOSIS — E1122 Type 2 diabetes mellitus with diabetic chronic kidney disease: Secondary | ICD-10-CM

## 2022-01-04 DIAGNOSIS — Z96641 Presence of right artificial hip joint: Secondary | ICD-10-CM | POA: Insufficient documentation

## 2022-01-04 DIAGNOSIS — R8271 Bacteriuria: Secondary | ICD-10-CM | POA: Diagnosis not present

## 2022-01-04 DIAGNOSIS — I959 Hypotension, unspecified: Secondary | ICD-10-CM | POA: Diagnosis not present

## 2022-01-04 DIAGNOSIS — F17210 Nicotine dependence, cigarettes, uncomplicated: Secondary | ICD-10-CM | POA: Insufficient documentation

## 2022-01-04 DIAGNOSIS — R4789 Other speech disturbances: Secondary | ICD-10-CM | POA: Diagnosis present

## 2022-01-04 DIAGNOSIS — N1831 Chronic kidney disease, stage 3a: Secondary | ICD-10-CM | POA: Diagnosis not present

## 2022-01-04 DIAGNOSIS — E876 Hypokalemia: Secondary | ICD-10-CM | POA: Insufficient documentation

## 2022-01-04 DIAGNOSIS — Z7984 Long term (current) use of oral hypoglycemic drugs: Secondary | ICD-10-CM | POA: Insufficient documentation

## 2022-01-04 DIAGNOSIS — I10 Essential (primary) hypertension: Secondary | ICD-10-CM | POA: Diagnosis present

## 2022-01-04 DIAGNOSIS — Z79899 Other long term (current) drug therapy: Secondary | ICD-10-CM | POA: Insufficient documentation

## 2022-01-04 DIAGNOSIS — R001 Bradycardia, unspecified: Secondary | ICD-10-CM | POA: Diagnosis not present

## 2022-01-04 DIAGNOSIS — E871 Hypo-osmolality and hyponatremia: Secondary | ICD-10-CM

## 2022-01-04 DIAGNOSIS — N183 Chronic kidney disease, stage 3 unspecified: Secondary | ICD-10-CM | POA: Diagnosis present

## 2022-01-04 DIAGNOSIS — I6523 Occlusion and stenosis of bilateral carotid arteries: Secondary | ICD-10-CM | POA: Diagnosis not present

## 2022-01-04 LAB — CBC WITH DIFFERENTIAL/PLATELET
Abs Immature Granulocytes: 0.02 10*3/uL (ref 0.00–0.07)
Basophils Absolute: 0.1 10*3/uL (ref 0.0–0.1)
Basophils Relative: 1 %
Eosinophils Absolute: 0.2 10*3/uL (ref 0.0–0.5)
Eosinophils Relative: 2 %
HCT: 39.1 % (ref 39.0–52.0)
Hemoglobin: 13.8 g/dL (ref 13.0–17.0)
Immature Granulocytes: 0 %
Lymphocytes Relative: 22 %
Lymphs Abs: 1.7 10*3/uL (ref 0.7–4.0)
MCH: 31.6 pg (ref 26.0–34.0)
MCHC: 35.3 g/dL (ref 30.0–36.0)
MCV: 89.5 fL (ref 80.0–100.0)
Monocytes Absolute: 0.6 10*3/uL (ref 0.1–1.0)
Monocytes Relative: 7 %
Neutro Abs: 5.3 10*3/uL (ref 1.7–7.7)
Neutrophils Relative %: 68 %
Platelets: 251 10*3/uL (ref 150–400)
RBC: 4.37 MIL/uL (ref 4.22–5.81)
RDW: 13.8 % (ref 11.5–15.5)
WBC: 7.8 10*3/uL (ref 4.0–10.5)
nRBC: 0 % (ref 0.0–0.2)

## 2022-01-04 LAB — HEMOGLOBIN A1C
Hgb A1c MFr Bld: 6.5 % — ABNORMAL HIGH (ref 4.8–5.6)
Mean Plasma Glucose: 139.85 mg/dL

## 2022-01-04 LAB — COMPREHENSIVE METABOLIC PANEL
ALT: 13 U/L (ref 0–44)
AST: 18 U/L (ref 15–41)
Albumin: 3.6 g/dL (ref 3.5–5.0)
Alkaline Phosphatase: 87 U/L (ref 38–126)
Anion gap: 9 (ref 5–15)
BUN: 11 mg/dL (ref 8–23)
CO2: 25 mmol/L (ref 22–32)
Calcium: 9 mg/dL (ref 8.9–10.3)
Chloride: 94 mmol/L — ABNORMAL LOW (ref 98–111)
Creatinine, Ser: 1.42 mg/dL — ABNORMAL HIGH (ref 0.61–1.24)
GFR, Estimated: 52 mL/min — ABNORMAL LOW (ref >60)
Glucose, Bld: 140 mg/dL — ABNORMAL HIGH (ref 70–99)
Potassium: 3.3 mmol/L — ABNORMAL LOW (ref 3.5–5.1)
Sodium: 128 mmol/L — ABNORMAL LOW (ref 135–145)
Total Bilirubin: 0.7 mg/dL (ref 0.3–1.2)
Total Protein: 6.3 g/dL — ABNORMAL LOW (ref 6.5–8.1)

## 2022-01-04 LAB — PROTIME-INR
INR: 1 (ref 0.8–1.2)
Prothrombin Time: 13.1 seconds (ref 11.4–15.2)

## 2022-01-04 LAB — URINALYSIS, ROUTINE W REFLEX MICROSCOPIC
Bilirubin Urine: NEGATIVE
Glucose, UA: NEGATIVE mg/dL
Ketones, ur: NEGATIVE mg/dL
Nitrite: NEGATIVE
Protein, ur: 100 mg/dL — AB
Specific Gravity, Urine: 1.015 (ref 1.005–1.030)
WBC, UA: >50 WBC/hpf — ABNORMAL HIGH (ref 0–5)
pH: 5 (ref 5.0–8.0)

## 2022-01-04 LAB — RAPID URINE DRUG SCREEN, HOSP PERFORMED
Amphetamines: NOT DETECTED
Barbiturates: NOT DETECTED
Benzodiazepines: NOT DETECTED
Cocaine: NOT DETECTED
Opiates: NOT DETECTED
Tetrahydrocannabinol: NOT DETECTED

## 2022-01-04 LAB — RESP PANEL BY RT-PCR (FLU A&B, COVID) ARPGX2
Influenza A by PCR: NEGATIVE
Influenza B by PCR: NEGATIVE
SARS Coronavirus 2 by RT PCR: NEGATIVE

## 2022-01-04 LAB — GLUCOSE, CAPILLARY
Glucose-Capillary: 97 mg/dL (ref 70–99)
Glucose-Capillary: 167 mg/dL — ABNORMAL HIGH (ref 70–99)

## 2022-01-04 LAB — ETHANOL: Alcohol, Ethyl (B): <10 mg/dL (ref <10)

## 2022-01-04 LAB — APTT: aPTT: 28 seconds (ref 24–36)

## 2022-01-04 IMAGING — CT CT HEAD W/O CM
4 series · 15 of 47 positions shown, 17 images · non-contrast
Comparison: Head CT [DATE].

CLINICAL DATA: Provided history: Dizziness, persistent/recurrent,
cardiac or vascular cause suspected; syncope/presyncope,
cerebrovascular cause suspected.



[Series 3: head without · axial · non-contrast · 0.42mm/px · z∈[-130,-10]mm · 7 of 34 slices shown, 9 images]
[im 5/34  brain]
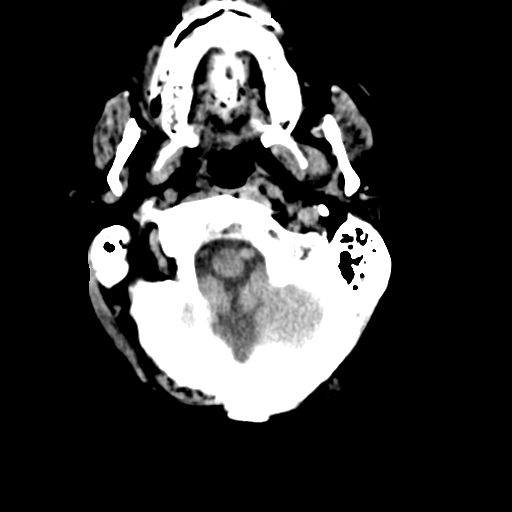
[im 5/34  bone]
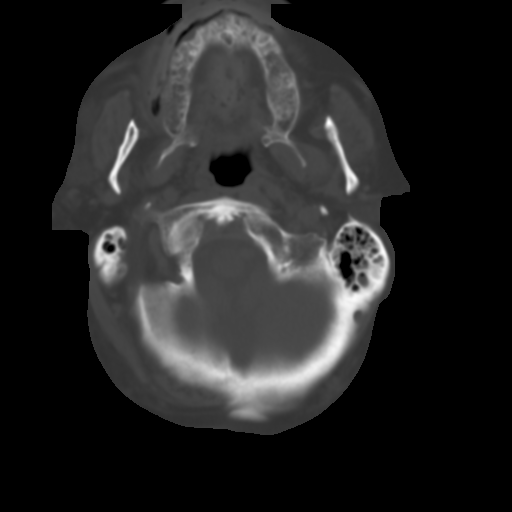
[im 9/34  brain]
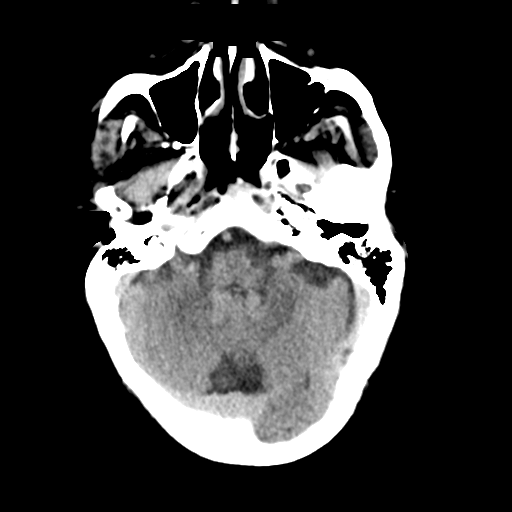
[im 13/34  brain]
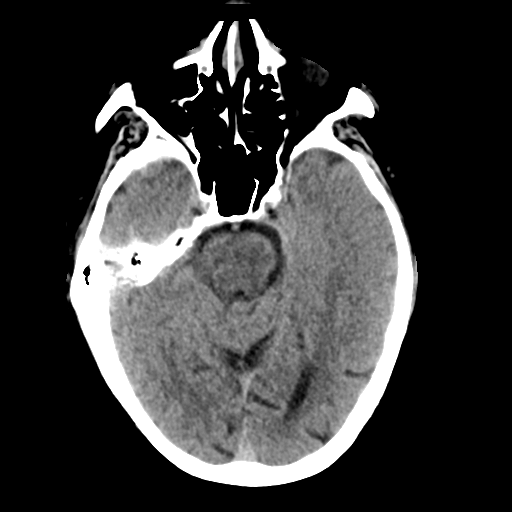
[im 17/34  brain]
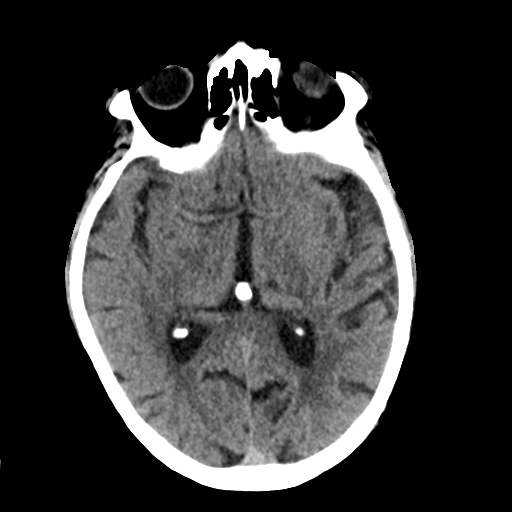
[im 21/34  brain]
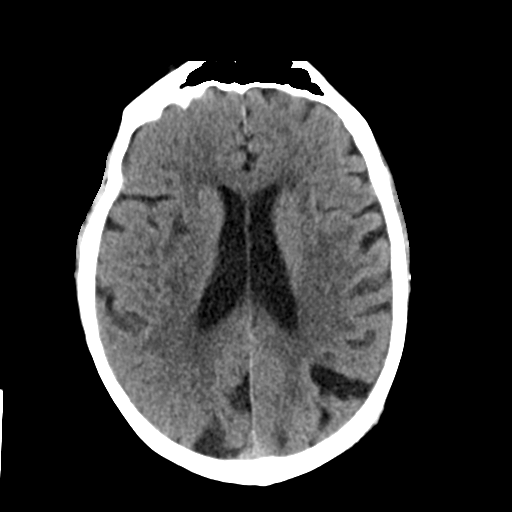
[im 21/34  bone]
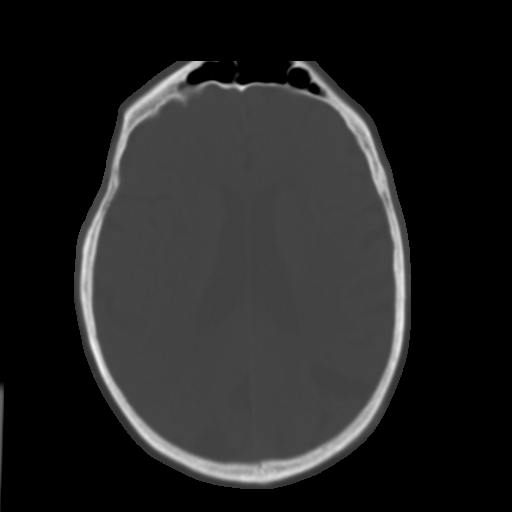
[im 25/34  brain]
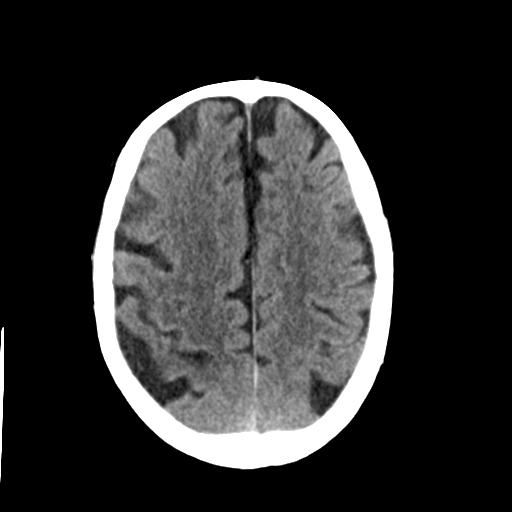
[im 29/34  brain]
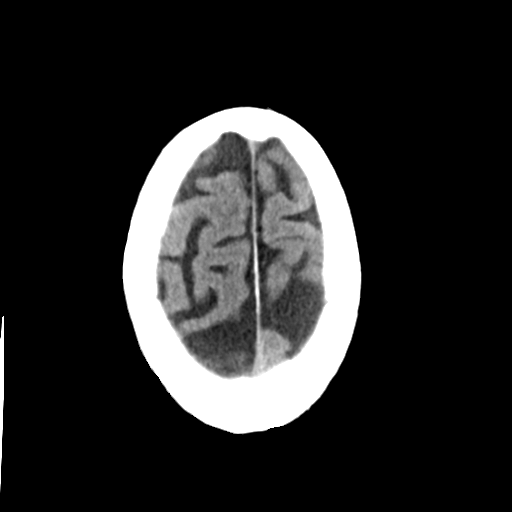

[Series 4: head bone · axial · 0.42mm/px · z∈[-134,-118]mm · 2 of 85 slices shown]
[im 9/85  bone]
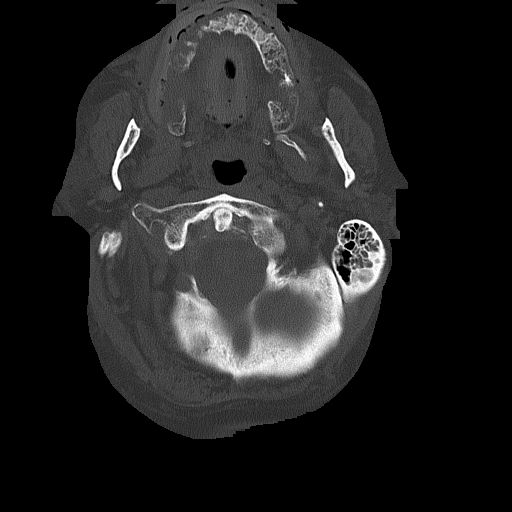
[im 17/85  bone]
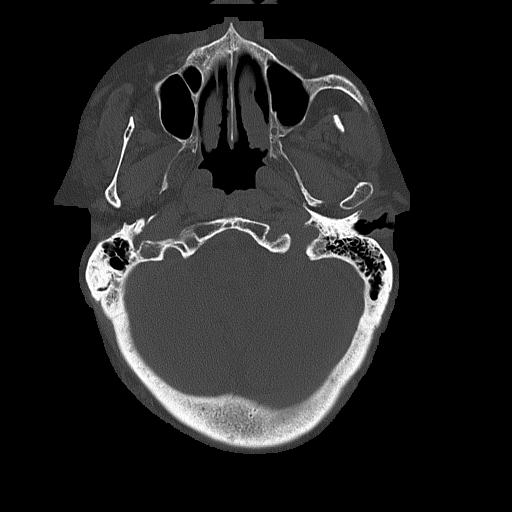

[Series 5: head without cor · coronal · non-contrast · 0.35mm/px · 3 of 74 slices shown]
[im 28/74  brain]
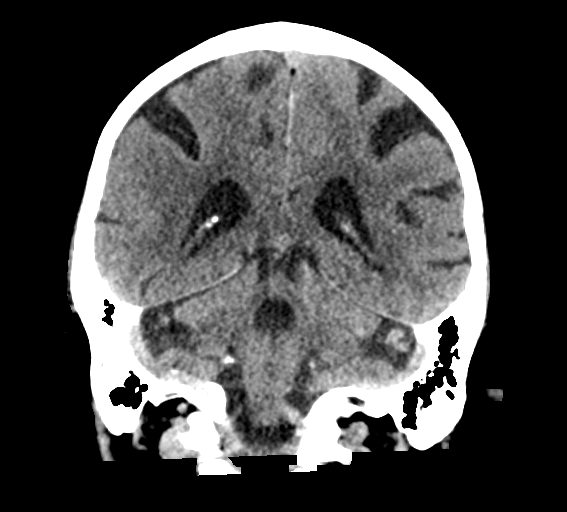
[im 34/74  brain]
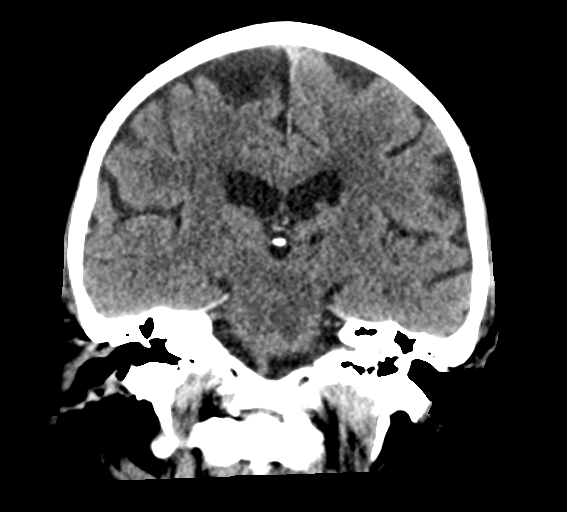
[im 40/74  brain]
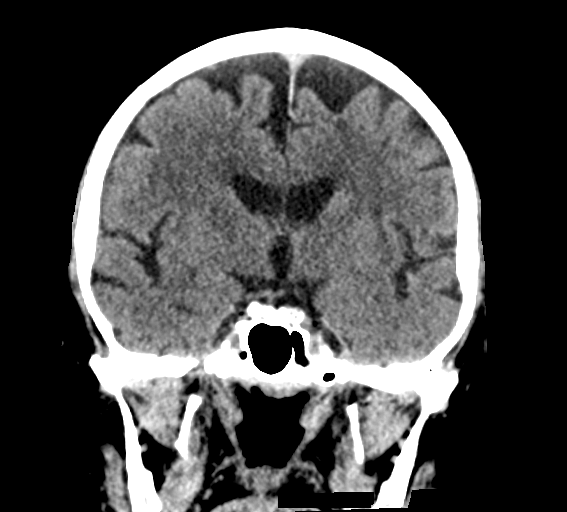

[Series 6: head without sag · sagittal · non-contrast · 0.33mm/px · 3 of 59 slices shown]
[im 20/59  brain]
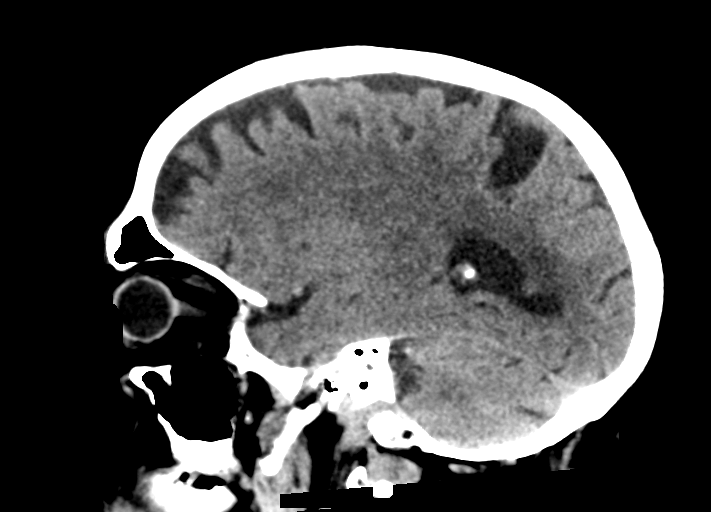
[im 30/59  brain]
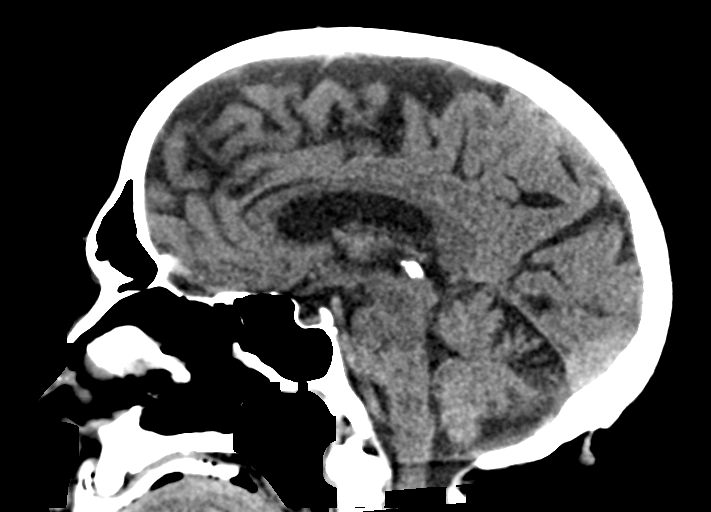
[im 39/59  brain]
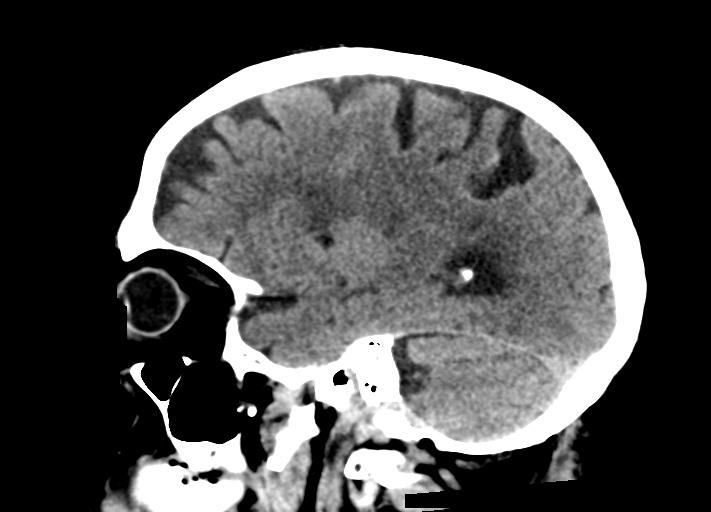

[15 of 47 positions shown; findings below may reference images not displayed]

FINDINGS: Brain:

Mild-to-moderate cerebral atrophy.

Redemonstrated chronic small-vessel infarct within the right corona
radiata/basal ganglia.

Redemonstrated chronic lacunar infarcts within the bilateral
thalami.

Mild ill-defined hypoattenuation within the cerebral white matter
and pons, nonspecific but compatible with chronic small vessel
ischemic disease.

There is no acute intracranial hemorrhage.

No demarcated cortical infarct.

No extra-axial fluid collection.

No evidence of an intracranial mass.

No midline shift.

Vascular: No hyperdense vessel.  Atherosclerotic calcifications.

Skull: Normal. Negative for fracture or focal lesion.

Sinuses/Orbits: Visualized orbits show no acute finding. Trace
mucosal thickening within the right maxillary sinus. Small mucous
retention cyst within the left maxillary sinus. Tiny mucous
retention cyst within the right sphenoid sinus. Trace mucosal
thickening within the bilateral ethmoid sinuses.

Other: Small mastoid effusions (greater on the left).
IMPRESSION: No evidence of acute intracranial abnormality.

Redemonstrated chronic small-vessel infarct within the right corona
radiata/basal ganglia.

Redemonstrated chronic lacunar infarcts within the bilateral
thalami.

Mild chronic small vessel ischemic changes within the cerebral white
matter and pons.

Mild-to-moderate cerebral atrophy.

Mild paranasal sinus disease, as described.

Small bilateral mastoid effusions.

## 2022-01-04 IMAGING — MR MR HEAD W/O CM
12 of 13 series · 44 of 48 positions shown · non-contrast
Comparison: Head CT and CTA [DATE]

CLINICAL DATA: Stroke follow-up. Dizziness, altered speech, and
altered gait.

EXAM:
MRI HEAD WITHOUT CONTRAST
TECHNIQUE: Multiplanar, multiecho pulse sequences of the brain and surrounding
structures were obtained without intravenous contrast.

[Series 5: DWI · axial · 3.0mm · 0.88mm/px · z∈[-61,+79]mm · 7 of 96 slices shown (1 of 4)]
[im 1/96]
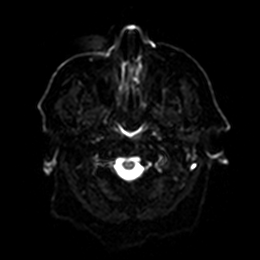
[im 16/96]
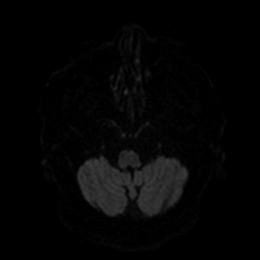
[im 32/96]
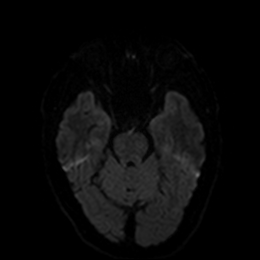
[im 48/96]
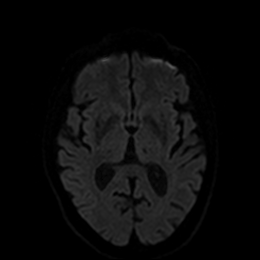
[im 64/96]
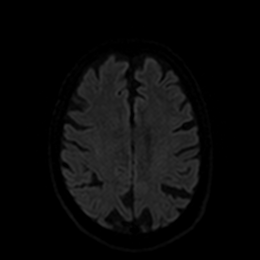
[im 80/96]
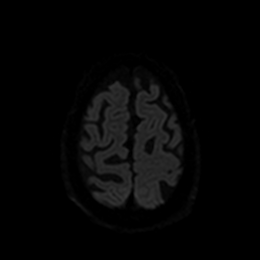
[im 96/96]
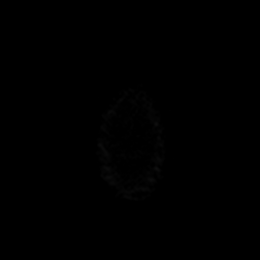

[Series 6: DWI · axial · 3.0mm · 0.88mm/px · z∈[-61,+79]mm · 4 of 48 slices shown (2 of 4)]
[im 1/48]
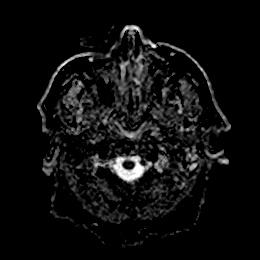
[im 16/48]
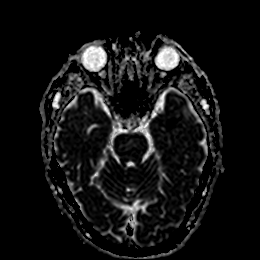
[im 32/48]
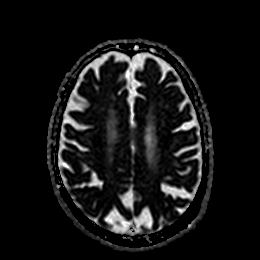
[im 48/48]
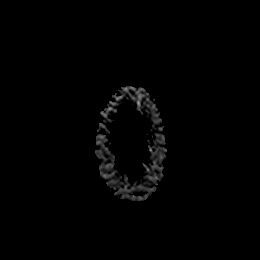

[Series 7: DWI · coronal · 4.0mm · 0.88mm/px · 6 of 72 slices shown (3 of 4)]
[im 1/72]
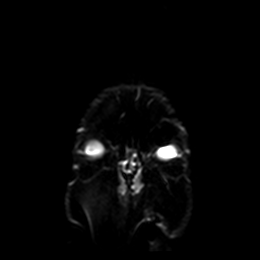
[im 15/72]
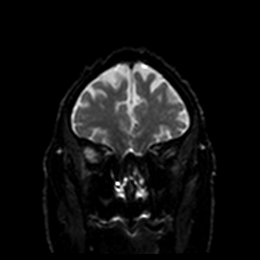
[im 29/72]
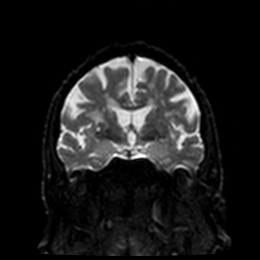
[im 43/72]
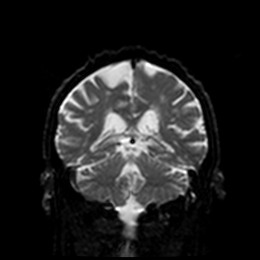
[im 57/72]
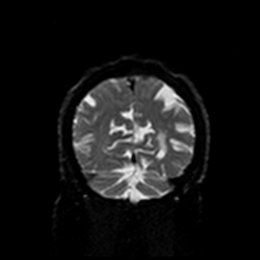
[im 72/72]
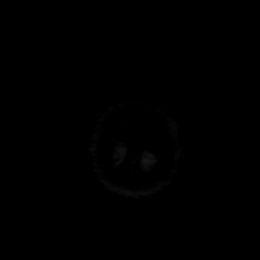

[Series 8: DWI · coronal · 4.0mm · 0.88mm/px · 3 of 36 slices shown (4 of 4)]
[im 1/36]
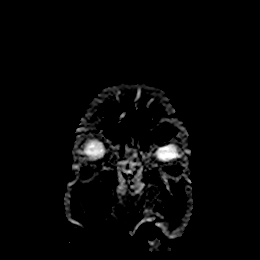
[im 18/36]
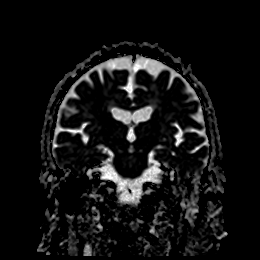
[im 36/36]
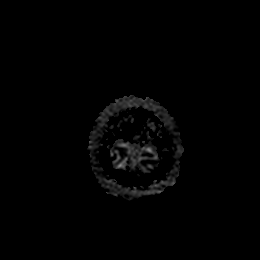

[Series 9: T1 · sagittal · 5.0mm · 0.75mm/px · 2 of 24 slices shown]
[im 1/24]
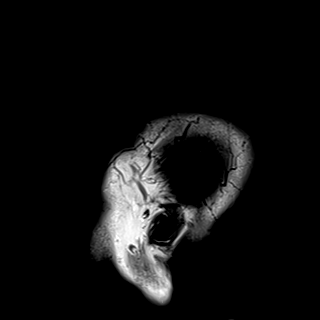
[im 24/24]
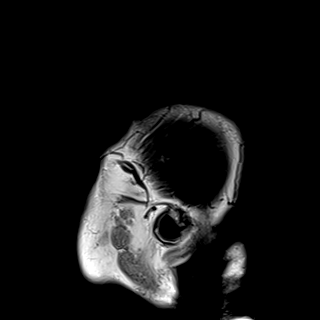

[Series 10: T2 · axial · 5.0mm · 0.72mm/px · z∈[-62,+81]mm · 2 of 25 slices shown (1 of 2)]
[im 1/25]
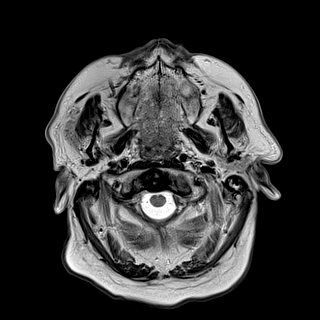
[im 25/25]
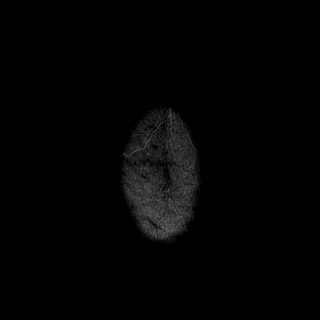

[Series 11: FLAIR · axial · 5.0mm · 0.45mm/px · z∈[-63,+80]mm · 2 of 25 slices shown]
[im 1/25]
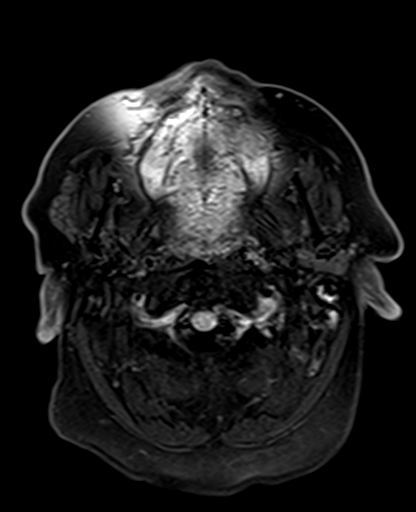
[im 25/25]
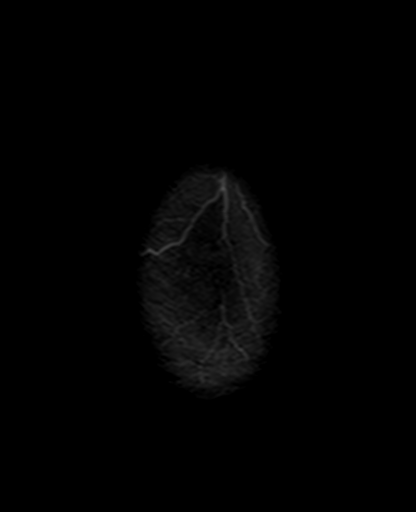

[Series 12: mag_images · axial · 3.0mm · 0.90mm/px · z∈[-68,+84]mm · 4 of 52 slices shown]
[im 1/52]
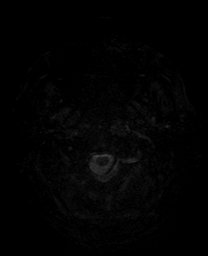
[im 18/52]
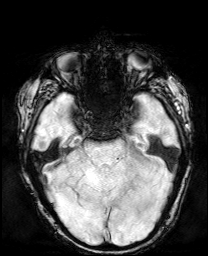
[im 35/52]
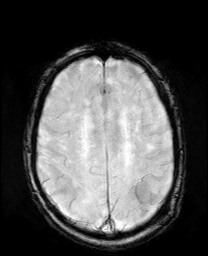
[im 52/52]
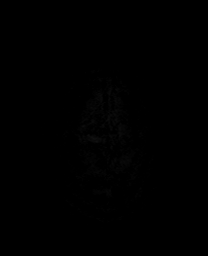

[Series 13: pha_images · axial · 3.0mm · 0.90mm/px · z∈[-68,+84]mm · 4 of 52 slices shown]
[im 1/52]
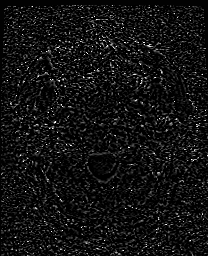
[im 18/52]
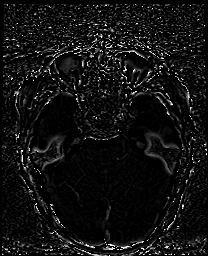
[im 35/52]
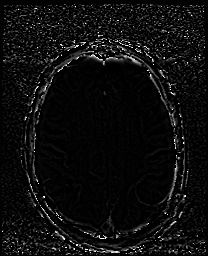
[im 52/52]
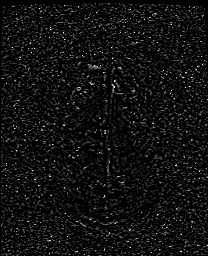

[Series 14: swi_images · axial · 3.0mm · 0.90mm/px · z∈[-68,+84]mm · 4 of 52 slices shown]
[im 1/52]
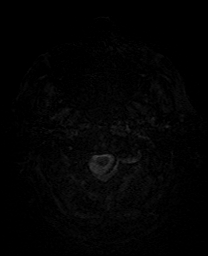
[im 18/52]
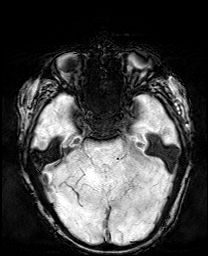
[im 35/52]
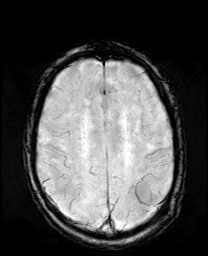
[im 52/52]
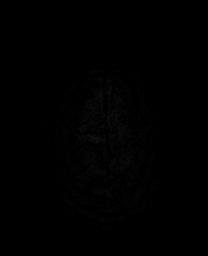

[Series 15: mip_images(sw) · axial · 24.0mm · 0.90mm/px · z∈[-57,+74]mm · 4 of 45 slices shown]
[im 1/45]
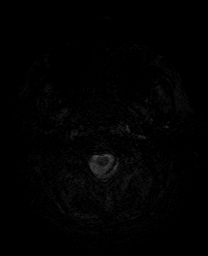
[im 15/45]
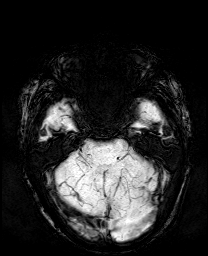
[im 30/45]
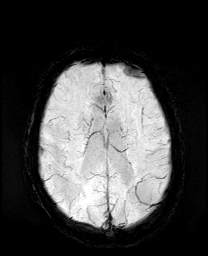
[im 45/45]
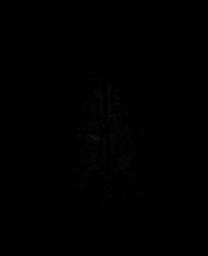

[Series 17: T2 · coronal · 5.0mm · 0.34mm/px · 2 of 29 slices shown (2 of 2)]
[im 1/29]
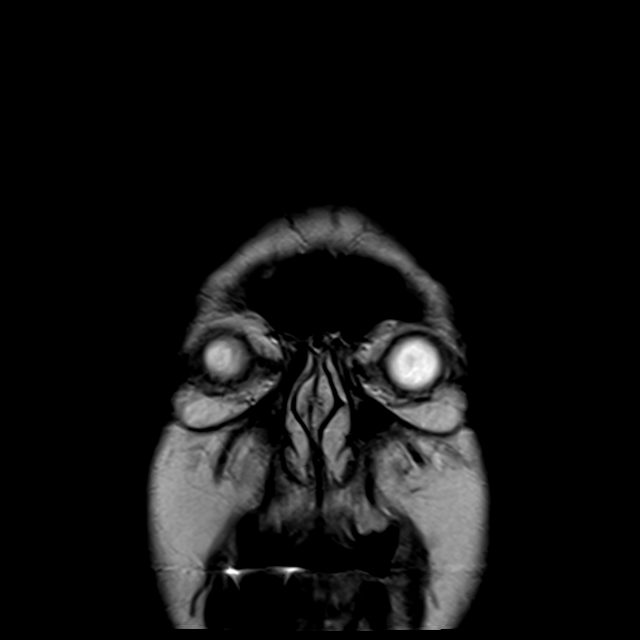
[im 29/29]
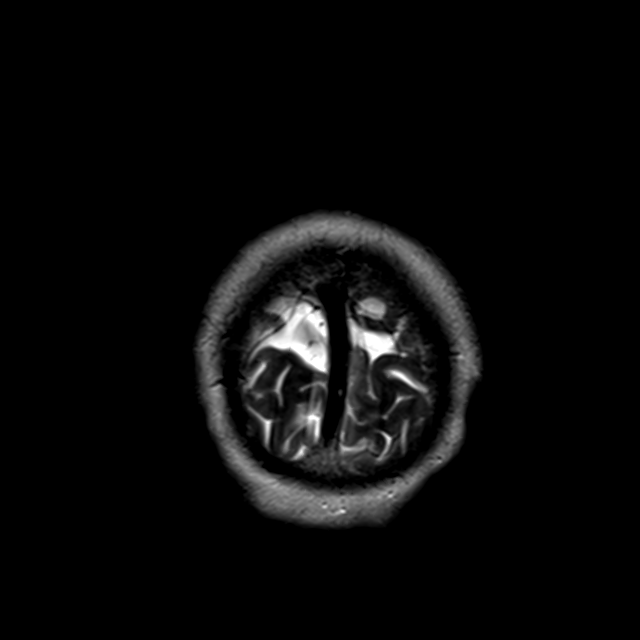

[44 of 48 positions shown; findings below may reference images not displayed]

FINDINGS: Brain: There is no evidence of an acute infarct, intracranial
hemorrhage, mass, midline shift, or extra-axial fluid collection.
Patchy T2 hyperintensities in the cerebral white matter and pons are
nonspecific but compatible with moderate chronic small vessel
ischemic disease. Chronic lacunar infarcts are noted in the right
corona radiata, bilateral basal ganglia, and thalami. There is
moderate cerebral atrophy.

Vascular: Major intracranial vascular flow voids are preserved.

Skull and upper cervical spine: Unremarkable bone marrow signal.

Sinuses/Orbits: Bilateral cataract extraction. Small right and
moderate left mastoid effusions. No significant inflammatory changes
in the paranasal sinuses.

Other: None.
IMPRESSION: 1. No acute intracranial abnormality.
2. Moderate chronic small vessel ischemic disease with chronic
lacunar infarcts as above.
3. Cerebral atrophy ([60]-[60]).

## 2022-01-04 MED ORDER — ENOXAPARIN SODIUM 40 MG/0.4ML IJ SOSY
40.0000 mg | PREFILLED_SYRINGE | INTRAMUSCULAR | Status: DC
  Administered 2022-01-04: 40 mg via SUBCUTANEOUS
  Filled 2022-01-04: qty 0.4

## 2022-01-04 MED ORDER — METHOCARBAMOL 500 MG PO TABS: 500.0000 mg | ORAL_TABLET | Freq: Every evening | ORAL | Status: DC | PRN

## 2022-01-04 MED ORDER — INSULIN ASPART 100 UNIT/ML IJ SOLN
0.0000 [IU] | Freq: Three times a day (TID) | INTRAMUSCULAR | Status: DC
  Administered 2022-01-05: 3 [IU] via SUBCUTANEOUS

## 2022-01-04 MED ORDER — ACETAMINOPHEN 650 MG RE SUPP: 650.0000 mg | RECTAL | Status: DC | PRN

## 2022-01-04 MED ORDER — TRAMADOL HCL 50 MG PO TABS
50.0000 mg | ORAL_TABLET | Freq: Three times a day (TID) | ORAL | Status: DC | PRN
Start: 2022-01-04 — End: 2022-01-05
  Administered 2022-01-04: 50 mg via ORAL
  Filled 2022-01-04: qty 1

## 2022-01-04 MED ORDER — LORAZEPAM 0.5 MG PO TABS: 0.5000 mg | ORAL_TABLET | ORAL | Status: DC | PRN

## 2022-01-04 MED ORDER — ACETAMINOPHEN 325 MG PO TABS: 650.0000 mg | ORAL_TABLET | ORAL | Status: DC | PRN

## 2022-01-04 MED ORDER — IOHEXOL 350 MG/ML SOLN
75.0000 mL | Freq: Once | INTRAVENOUS | Status: AC | PRN
  Administered 2022-01-04: 75 mL via INTRAVENOUS

## 2022-01-04 MED ORDER — MECLIZINE HCL 12.5 MG PO TABS: 12.5000 mg | ORAL_TABLET | Freq: Two times a day (BID) | ORAL | Status: DC | PRN

## 2022-01-04 MED ORDER — NICOTINE 21 MG/24HR TD PT24
21.0000 mg | MEDICATED_PATCH | Freq: Every day | TRANSDERMAL | Status: DC
  Filled 2022-01-04 (×2): qty 1

## 2022-01-04 MED ORDER — TRAZODONE HCL 50 MG PO TABS: 50.0000 mg | ORAL_TABLET | Freq: Every evening | ORAL | Status: DC | PRN

## 2022-01-04 MED ORDER — HYDRALAZINE HCL 25 MG PO TABS: 25.0000 mg | ORAL_TABLET | Freq: Four times a day (QID) | ORAL | Status: DC | PRN

## 2022-01-04 MED ORDER — ACETAMINOPHEN 160 MG/5ML PO SOLN: 650.0000 mg | ORAL | Status: DC | PRN

## 2022-01-04 MED ORDER — STROKE: EARLY STAGES OF RECOVERY BOOK
Freq: Once | Status: AC
  Filled 2022-01-04: qty 1

## 2022-01-04 MED ORDER — ASPIRIN EC 81 MG PO TBEC
81.0000 mg | DELAYED_RELEASE_TABLET | Freq: Every day | ORAL | Status: DC
  Administered 2022-01-04 – 2022-01-05 (×2): 81 mg via ORAL
  Filled 2022-01-04 (×2): qty 1

## 2022-01-04 MED ORDER — ACETAMINOPHEN 500 MG PO TABS: 500.0000 mg | ORAL_TABLET | Freq: Four times a day (QID) | ORAL | Status: DC | PRN

## 2022-01-04 MED ORDER — SODIUM CHLORIDE 0.9 % IV SOLN: INTRAVENOUS | Status: AC

## 2022-01-04 MED ORDER — DOCUSATE SODIUM 100 MG PO CAPS
100.0000 mg | ORAL_CAPSULE | Freq: Every day | ORAL | Status: DC | PRN
Start: 2022-01-04 — End: 2022-01-05

## 2022-01-04 MED ORDER — POTASSIUM CHLORIDE CRYS ER 20 MEQ PO TBCR
40.0000 meq | EXTENDED_RELEASE_TABLET | Freq: Once | ORAL | Status: AC
  Administered 2022-01-04: 40 meq via ORAL
  Filled 2022-01-04: qty 2

## 2022-01-04 MED ORDER — GABAPENTIN 100 MG PO CAPS
100.0000 mg | ORAL_CAPSULE | Freq: Two times a day (BID) | ORAL | Status: DC
  Administered 2022-01-04 (×2): 100 mg via ORAL
  Filled 2022-01-04 (×3): qty 1

## 2022-01-04 NOTE — ED Provider Triage Note (Signed)
Emergency Medicine Provider Triage Evaluation Note ? ?Kendrick Haapala , a 76 y.o. male  was evaluated in triage.  Pt complains of dizziness, near syncope. States that same began when he woke up this morning, states every time he stands up he feels dizzy and as though he is going to loose consciousness. Wife was concerned that he was also having some slurred speech. Denies any loss of consciousness or headaches. Was hypotensive with EMS who gave fluids. ? ?Review of Systems  ?Positive: Dizziness, near syncope ?Negative: Fever, chills, headaches ? ?Physical Exam  ?BP 94/61 (BP Location: Left Arm)   Pulse 66   Temp 98.4 ?F (36.9 ?C) (Oral)   Resp 16   Ht '5\' 5"'$  (1.651 m)   Wt 73 kg   SpO2 99%   BMI 26.79 kg/m?  ?Gen:   Awake, no distress   ?Resp:  Normal effort  ?MSK:   Moves extremities without difficulty  ?Other:  Alert and oriented and neurologically intact ? ?Medical Decision Making  ?Medically screening exam initiated at 10:36 AM.  Appropriate orders placed.  Zai Chmiel was informed that the remainder of the evaluation will be completed by another provider, this initial triage assessment does not replace that evaluation, and the importance of remaining in the ED until their evaluation is complete. ? ? ?  ?Bud Face, PA-C ?01/04/22 1041 ? ?

## 2022-01-04 NOTE — H&P (Signed)
?History and Physical  ? ? ?Antonio Serrano VOJ:[id number] DOB: 1945-11-17 DOA: 01/04/2022 ? ?PCP: Tammi Sou, MD (Confirm with patient/family/NH records and if not entered, this has to be entered at [facility] point of entry) ?Patient coming from: Home ? ?I have personally briefly reviewed patient's old medical records in [place] ? ?Chief Complaint: garbled speech ? ?HPI: Antonio Serrano is a 76 y.o. male with medical history significant of nonobstructive CAD, IIDM, HTN, chronic hyponatremia, chronic ambulation dysfunction chronic degenerative lumbar arthritis, cigar smoker, HLD, CKD stage II, presented with strokelike symptoms. ? ?Patient felt dizzy while trying to get up out of the bed, and felt weaker on the right leg, then he fell down on his right side.  No loss of consciousness, he was able to support himself up.  Around 7 AM, daughters found the patient was having garbled speech persisted.  Patient reported to the right side leg weakness recovered.  However the speech problem persisted, he denies any trouble understanding other people talking.  At baseline he has chronic ambulation dysfunction, secondary to his chronic back problem and he has had chronic right hip pain and ROM reduction after right hip replacement in November 2022.  The right hip pain has become uncontrolled and he was started on gabapentin 100 mg twice daily since this Monday, last dose was last night.  In addition, patient also reported frequent episode of BPPV-like symptoms, when he has a few seconds of vertigo when turning to his side to the side too quickly, he will need support himself or sit down to let the episode pass.  No history of stroke or heart attack in the past. ? ?ED Course: Vital signs stable, afebrile. ? ?CT head negative for acute findings, CT angiogram negative for major stenosis. ? ?Review of Systems: As per HPI otherwise 14 point review of systems negative.  ? ? ?Past Medical History:  ?Diagnosis Date  ?  Atherosclerosis of coronary artery   ? On chest CT->calcif in LAD and RCA  ? Chronic prostatitis   ? Very mild elevation of PSA (>4), referred to [facility] where PSA repeat was 1.0 on 06/16/13.  Annual PSA repeat is all that is needed now per urologist.  Most recent 07/2015 was 0.45.  ? Chronic renal insufficiency, stage III (moderate) (HCC)   ? Baseline GFR as of 2019= 50s.    ? Closed right hip fracture ([place]) 03/2021  ? THA  ? Diabetes mellitus with complication ([place]) Dx'd fall 2011  ? HTN (hypertension)   ? Renal/aortic doppler u/s normal 06/2010  ? Hypercalcemia   ? Hyperlipidemia 2016  ? Statin intolerant--myalgias.  Pt refuses any further trial of statin as of 2018.  ? Nonspecific abnormal electrocardiogram (ECG) (EKG) Fall 2011  ? TWI in inferior leads: myocardial perfusion scan neg and echo normal 07/2010  ? Tobacco dependence   ? UTI (lower urinary tract infection)   ? Feb 2012 (klebsiella--dx at [facility]); 03/2013 e coli.   ? ? ?Past Surgical History:  ?Procedure Laterality Date  ? CARDIOVASCULAR STRESS TEST  07/2010  ? Myocardial perfusion scan neg/low risk.  ? CATARACT EXTRACTION  2007, 2008  ? Bilateral ([facility] on [address].  ? COLONOSCOPY  12/20/04;12/2014  ? 2016 no polyps: recall 5 yrs due to [place] of colon ca  ? ESOPHAGOGASTRODUODENOSCOPY  11/2004  ? esoph stricture/dilation  ? TOTAL HIP ARTHROPLASTY Right 04/23/2021  ? Procedure: TOTAL HIP ARTHROPLASTY ANTERIOR APPROACH;  Surgeon: Meredith Pel, MD;  Location: [facility];  Service: Orthopedics;  Laterality: Right;  Hana, C-arm, Depuy  ? TRANSTHORACIC ECHOCARDIOGRAM  07/2010  ? EF =>55%; LA mildly dilated; trace MR/TR;   ? ? ? reports that he has been smoking cigarettes. He has a 12.50 pack-year smoking history. He has never used smokeless tobacco. He reports that he does not currently use alcohol. He reports that he does not use drugs. ? ?Allergies  ?Allergen Reactions  ? Bactrim [Sulfamethoxazole-Trimethoprim] Nausea Only  ? ? ?Family  History  ?Problem Relation Age of Onset  ? Dementia Mother   ? Pneumonia Father   ?     died in 54's of pneumonia, was otherwise healthy  ? Cancer Sister   ?     colon cancer.  Died age 61  ? Diabetes Paternal Aunt   ? Diabetes Sister   ? Colon cancer Neg Hx   ? ? ? ?Prior to Admission medications   ?Medication Sig Start Date End Date Taking? Authorizing Provider  ?aspirin 81 MG EC tablet Take 81 mg by mouth daily. Swallow whole.   Yes [provider]  ?bismuth subsalicylate (PEPTO BISMOL) 262 MG/15ML suspension Take 30 mLs by mouth every 6 (six) hours as needed. Entered as standard dose   Yes [provider]  ?carvedilol (COREG) 6.25 MG tablet Take 1 tablet (6.25 mg total) by mouth 2 (two) times daily with a meal. 09/13/21  Yes [name], Adrian Blackwater, MD  ?Docusate Calcium (STOOL SOFTENER PO) Take 1 tablet by mouth daily as needed (constipation).   Yes [provider]  ?furosemide (LASIX) 40 MG tablet TAKE 1 TABLET BY MOUTH ONCE DAILY . APPOINTMENT REQUIRED FOR FUTURE REFILLS 03/17/21  Yes [name], Adrian Blackwater, MD  ?gabapentin (NEURONTIN) 100 MG capsule Take 1 capsule (100 mg total) by mouth 2 (two) times daily. 01/01/22  Yes Meredith Pel, MD  ?methocarbamol (ROBAXIN) 500 MG tablet 1 po q hs prn ?Patient taking differently: Take 500 mg by mouth at bedtime as needed for muscle spasms. 11/08/21  Yes Meredith Pel, MD  ?olmesartan (BENICAR) 20 MG tablet Take 1/2 (one-half) tablet by mouth once daily 05/24/21  Yes [name], Adrian Blackwater, MD  ?OVER THE COUNTER MEDICATION Take 2 tablets by mouth daily. Elderberry, zinc, and vitamin C combo medication OTC.   Yes [provider]  ?pioglitazone (ACTOS) 15 MG tablet TAKE 1/2 TO 1 (ONE-HALF TO ONE) TABLET BY MOUTH ONCE DAILY 12/28/21  Yes [name], Adrian Blackwater, MD  ?TRAZODONE HCL PO Take 1 tablet by mouth See admin instructions. Every 6-8 hours   Yes [provider]  ?acetaminophen (TYLENOL) 500 MG tablet Take 1 tablet (500 mg total) by mouth  every 6 (six) hours as needed for mild pain or fever (pain score 1-3 or temp > 100.5). ?Patient taking differently: Take 500 mg by mouth at bedtime as needed for mild pain. 04/25/21   Barb Merino, MD  ?esomeprazole (NEXIUM) 20 MG capsule Take 20 mg by mouth daily at 12 noon.    [provider]  ?ipratropium (ATROVENT) 0.03 % nasal spray USE 2 SPRAY(S) IN EACH NOSTRIL EVERY 12 HOURS 09/13/21   [name], Adrian Blackwater, MD  ?traMADol (ULTRAM) 50 MG tablet Take 1 tablet (50 mg total) by mouth every 8 (eight) hours as needed. ?Patient taking differently: Take 50 mg by mouth every 6 (six) hours as needed. 01/01/22   Meredith Pel, MD  ? ? ?Physical Exam: ?Vitals:  ? 01/04/22 1300 01/04/22 1330 01/04/22 1400 01/04/22 1430  ?BP:  115/63 (!) 126/56 (!) 109/54 (!) 117/59  ?Pulse: 60 66 64 64  ?Resp: '17 11 15 12  '$ ?Temp:      ?TempSrc:      ?SpO2: 97% 98% 97% 94%  ?Weight:      ?Height:      ? ? ?Constitutional: NAD, calm, comfortable ?Vitals:  ? 01/04/22 1300 01/04/22 1330 01/04/22 1400 01/04/22 1430  ?BP: 115/63 (!) 126/56 (!) 109/54 (!) 117/59  ?Pulse: 60 66 64 64  ?Resp: '17 11 15 12  '$ ?Temp:      ?TempSrc:      ?SpO2: 97% 98% 97% 94%  ?Weight:      ?Height:      ? ?Eyes: PERRL, lids and conjunctivae normal ?ENMT: Mucous membranes are moist. Posterior pharynx clear of any exudate or lesions.Normal dentition.  ?Neck: normal, supple, no masses, no thyromegaly ?Respiratory: clear to auscultation bilaterally, no wheezing, no crackles. Normal respiratory effort. No accessory muscle use.  ?Cardiovascular: Regular rate and rhythm, no murmurs / rubs / gallops. No extremity edema. 2+ pedal pulses. No carotid bruits.  ?Abdomen: no tenderness, no masses palpated. No hepatosplenomegaly. Bowel sounds positive.  ?Musculoskeletal: no clubbing / cyanosis. No joint deformity upper and lower extremities. Good ROM, no contractures. Normal muscle tone.  ?Skin: no rashes, lesions, ulcers. No induration ?Neurologic: CN 2-12 grossly intact.  Sensation intact, DTR normal. Strength 5/5 in all 4.  ?Psychiatric: Normal judgment and insight. Alert and oriented x 3. Normal mood.  ? ? ? ?Labs on Admission: I have personally reviewed following labs and imagi

## 2022-01-04 NOTE — ED Triage Notes (Signed)
EMS stated, pt complains of dizziness. He got up this morning and felt light headed and then fell to the floor and fell to floor but did not pass out. Near syncope episode. ?20 g in left forearm, given 150 cc fluid ?

## 2022-01-04 NOTE — ED Provider Notes (Signed)
?[facility] ?Provider Note ? ? ?CSN: [id number] ?Arrival date & time: 01/04/22  1008 ? ?  ? ?History ? ?No chief complaint on file. ? ? ?Antonio Serrano is a 76 y.o. male with a past medical history of type 2 diabetes, tobacco abuse, chronic renal insufficiency, hypertension, hyperlipidemia who presents with complaints of dizziness, altered speech and altered gait.  Last known well was 11 PM last night.  Patient states that he first awoke around 4:00 in the morning.  He has difficulty walking due to a hip surgery.  He states that he normally is using a walker.  Today when he awoke he needed to urinate and uses a bedside urinal but has having trouble localizing to grasp the urinal.  He was able to finally grab it and urinate and then went back to bed.  Around 6:00 in the morning he woke up.  He laid in bed but states that he noted he felt like he was a little bit dizzy or off balance.  Around 8 AM he tried to get up and turn his alarm system off to let his daughter and however he was extremely off balance and he fell to the floor.  His daughter states when she came in he was extremely weak and feeling dizzy and that his speech was nearly unintelligible.  She states that his speech continues to be abnormal although he has improved significantly since she first saw him at 8 AM.  Patient states that his gait abnormality is new since this morning.  He complains of "feeling like a lot of boat."  He has had multiple episodes of intermittent symptoms like this in the past regarding balance and lightheadedness.  He was told he had vertigo previously. ? ?HPI ? ?  ? ?Home Medications ?Prior to Admission medications   ?Medication Sig Start Date End Date Taking? Authorizing Provider  ?aspirin 81 MG EC tablet Take 81 mg by mouth daily. Swallow whole.   Yes [provider]  ?bismuth subsalicylate (PEPTO BISMOL) 262 MG/15ML suspension Take 30 mLs by mouth every 6 (six) hours as needed.  Entered as standard dose   Yes [provider]  ?carvedilol (COREG) 6.25 MG tablet Take 1 tablet (6.25 mg total) by mouth 2 (two) times daily with a meal. 09/13/21  Yes [name], Adrian Blackwater, MD  ?Docusate Calcium (STOOL SOFTENER PO) Take 1 tablet by mouth daily as needed (constipation).   Yes [provider]  ?furosemide (LASIX) 40 MG tablet TAKE 1 TABLET BY MOUTH ONCE DAILY . APPOINTMENT REQUIRED FOR FUTURE REFILLS 03/17/21  Yes [name], Adrian Blackwater, MD  ?gabapentin (NEURONTIN) 100 MG capsule Take 1 capsule (100 mg total) by mouth 2 (two) times daily. 01/01/22  Yes Meredith Pel, MD  ?methocarbamol (ROBAXIN) 500 MG tablet 1 po q hs prn ?Patient taking differently: Take 500 mg by mouth at bedtime as needed for muscle spasms. 11/08/21  Yes Meredith Pel, MD  ?olmesartan (BENICAR) 20 MG tablet Take 1/2 (one-half) tablet by mouth once daily 05/24/21  Yes [name], Adrian Blackwater, MD  ?OVER THE COUNTER MEDICATION Take 2 tablets by mouth daily. Elderberry, zinc, and vitamin C combo medication OTC.   Yes [provider]  ?pioglitazone (ACTOS) 15 MG tablet TAKE 1/2 TO 1 (ONE-HALF TO ONE) TABLET BY MOUTH ONCE DAILY 12/28/21  Yes [name], Adrian Blackwater, MD  ?TRAZODONE HCL PO Take 1 tablet by mouth See admin instructions. Every 6-8 hours   Yes [provider]  ?  acetaminophen (TYLENOL) 500 MG tablet Take 1 tablet (500 mg total) by mouth every 6 (six) hours as needed for mild pain or fever (pain score 1-3 or temp > 100.5). ?Patient taking differently: Take 500 mg by mouth at bedtime as needed for mild pain. 04/25/21   Barb Merino, MD  ?esomeprazole (NEXIUM) 20 MG capsule Take 20 mg by mouth daily at 12 noon.    [provider]  ?ipratropium (ATROVENT) 0.03 % nasal spray USE 2 SPRAY(S) IN EACH NOSTRIL EVERY 12 HOURS 09/13/21   [name], Adrian Blackwater, MD  ?traMADol (ULTRAM) 50 MG tablet Take 1 tablet (50 mg total) by mouth every 8 (eight) hours as needed. ?Patient taking differently: Take 50 mg by  mouth every 6 (six) hours as needed. 01/01/22   Meredith Pel, MD  ?   ? ?Allergies    ?Bactrim [sulfamethoxazole-trimethoprim]   ? ?Review of Systems   ?Review of Systems ? ?Physical Exam ?Updated Vital Signs ?BP (!) 117/59   Pulse 64   Temp 98.4 ?F (36.9 ?C) (Oral)   Resp 12   Ht '5\' 5"'$  (1.651 m)   Wt 73 kg   SpO2 94%   BMI 26.79 kg/m?  ?Physical Exam ?Vitals and nursing note reviewed.  ?Constitutional:   ?   General: He is not in acute distress. ?   Appearance: He is well-developed. He is not diaphoretic.  ?HENT:  ?   Head: Normocephalic and atraumatic.  ?Eyes:  ?   General: No scleral icterus. ?   Extraocular Movements: Extraocular movements intact.  ?   Conjunctiva/sclera: Conjunctivae normal.  ?   Pupils: Pupils are equal, round, and reactive to light.  ?Cardiovascular:  ?   Rate and Rhythm: Normal rate and regular rhythm.  ?   Heart sounds: Normal heart sounds.  ?Pulmonary:  ?   Effort: Pulmonary effort is normal. No respiratory distress.  ?   Breath sounds: Normal breath sounds.  ?Abdominal:  ?   Palpations: Abdomen is soft.  ?   Tenderness: There is no abdominal tenderness.  ?Musculoskeletal:  ?   Cervical back: Normal range of motion and neck supple.  ?Skin: ?   General: Skin is warm and dry.  ?Neurological:  ?   Mental Status: He is alert.  ?   Comments: Patient with noted bilateral nystagmus. ?Speech is dysarthric. ?Extraocular movements intact, visual fields are full to confrontation.  Symmetric rise of the palate and uvula, tongue is midline, facial movements are normal with normal strength and no facial flattening.  Sensation intact. ?Patient has normal finger-to-nose and rapid alternating movements.  No pronator drift, symmetric strength to strength in the upper and lower extremities which appears to be 5 out of 5.  Patellar reflexes are 2+ at the knee.  No clonus at the ankle.  Sensation is normal in upper and lower extremities ?Unable to perform gait or balance as see patient is too  off-balance to stand.  ?Psychiatric:     ?   Behavior: Behavior normal.  ? ? ?ED Results / Procedures / Treatments   ?Labs ?(all labs ordered are listed, but only abnormal results are displayed) ?Labs Reviewed  ?COMPREHENSIVE METABOLIC PANEL - Abnormal; Notable for the following components:  ?    Result Value  ? Sodium 128 (*)   ? Potassium 3.3 (*)   ? Chloride 94 (*)   ? Glucose, Bld 140 (*)   ? Creatinine, Ser 1.42 (*)   ? Total Protein 6.3 (*)   ?  GFR, Estimated 52 (*)   ? All other components within normal limits  ?URINALYSIS, ROUTINE W REFLEX MICROSCOPIC - Abnormal; Notable for the following components:  ? Color, Urine AMBER (*)   ? APPearance CLOUDY (*)   ? Hgb urine dipstick SMALL (*)   ? Protein, ur 100 (*)   ? Leukocytes,Ua LARGE (*)   ? WBC, UA >50 (*)   ? Bacteria, UA MANY (*)   ? Non Squamous Epithelial 6-10 (*)   ? All other components within normal limits  ?RESP PANEL BY RT-PCR (FLU A&B, COVID) ARPGX2  ?URINE CULTURE  ?CBC WITH DIFFERENTIAL/PLATELET  ?ETHANOL  ?PROTIME-INR  ?APTT  ?RAPID URINE DRUG SCREEN, HOSP PERFORMED  ?HEMOGLOBIN A1C  ?I-STAT CHEM 8, ED  ? ? ?EKG ?EKG Interpretation ? ?Date/Time:  Thursday January 04 2022 10:11:22 EDT ?Ventricular Rate:  65 ?PR Interval:  314 ?QRS Duration: 80 ?QT Interval:  402 ?QTC Calculation: 418 ?R Axis:   -9 ?Text Interpretation: Sinus rhythm with 1st degree A-V block Low voltage QRS Borderline ECG When compared with ECG of 23-Apr-2021 05:10, PREVIOUS ECG IS PRESENT since last tracing no significant change Confirmed by Malvin Johns [phone]) on 01/04/2022 12:09:59 PM ? ?Radiology ?CT ANGIO HEAD NECK W WO CM ? ?Result Date: 01/04/2022 ?CLINICAL DATA:  Dizziness EXAM: CT ANGIOGRAPHY HEAD AND NECK TECHNIQUE: Multidetector CT imaging of the head and neck was performed using the standard protocol during bolus administration of intravenous contrast. Multiplanar CT image reconstructions and MIPs were obtained to evaluate the vascular anatomy. Carotid stenosis measurements  (when applicable) are obtained utilizing NASCET criteria, using the distal internal carotid diameter as the denominator. RADIATION DOSE REDUCTION: This exam was performed according to the departmental dose-optimizati

## 2022-01-04 NOTE — Telephone Encounter (Signed)
Pt step daughter Bethena Roys) said pt fell  & his words are slurred & he's dizzy. ? ?Informed patient that I could get her over to triage nurse then informed her to call 911. ? ? ?

## 2022-01-04 NOTE — Telephone Encounter (Signed)
noted 

## 2022-01-04 NOTE — Progress Notes (Signed)
? ?Office Visit Note ?  ?Patient: Antonio Serrano           ?Date of Birth: 1946-07-20           ?MRN: [id number] ?Visit Date: 01/01/2022 ?Requested by: Tammi Sou, MD ?[phone] ?[place],  [place] [address] ?PCP: Tammi Sou, MD ? ?Subjective: ?Chief Complaint  ?Patient presents with  ? Other  ?  Review scans   ? ? ?HPI: Antonio Serrano is a 76 year old patient here to review his scans on his hip and back.  Hip scan shows prosthesis in good position and alignment.  Mild muscle atrophy in the gluteus medius tendon.  Ossicle which appears to be a rounded avulsion type ossicle from the trochanter is present without much displacement compared to prior radiographs.  Back scan shows severe right-sided foraminal stenosis at L4-5.  Antonio Serrano does report some low back pain as well as some hip giving way.  He uses a walker.  His walking ability comes and goes. ?             ?ROS: All systems reviewed are negative as they relate to the chief complaint within the history of present illness.  Patient denies  fevers or chills. ? ? ?Assessment & Plan: ?Visit Diagnoses:  ?1. Low back pain, unspecified back pain laterality, unspecified chronicity, unspecified whether sciatica present   ? ? ?Plan: Impression is right L4-5 foraminal stenosis with some hip abductor weakness likely related to minimally displaced trochanteric avulsion.  He is able to maintain fairly level pelvis with standing on the right and left leg.  Plan Neurontin and tramadol and refer to Dr. Ernestina Patches for foraminal injection x2.  I will also ask one of my partners about their thoughts on this avulsion injury of the abductor's. ? ?Follow-Up Instructions: No follow-ups on file.  ? ?Orders:  ?Orders Placed This Encounter  ?Procedures  ? Ambulatory referral to Physical Medicine Rehab  ? ?Meds ordered this encounter  ?Medications  ? traMADol (ULTRAM) 50 MG tablet  ?  Sig: Take 1 tablet (50 mg total) by mouth every 8 (eight) hours as needed.  ?  Dispense:  60 tablet  ?   Refill:  0  ? gabapentin (NEURONTIN) 100 MG capsule  ?  Sig: Take 1 capsule (100 mg total) by mouth 2 (two) times daily.  ?  Dispense:  60 capsule  ?  Refill:  0  ? ? ? ? Procedures: ?No procedures performed ? ? ?Clinical Data: ?No additional findings. ? ?Objective: ?Vital Signs: There were no vitals taken for this visit. ? ?Physical Exam:  ? ?Constitutional: Patient appears well-developed ?HEENT:  ?Head: Normocephalic ?Eyes:EOM are normal ?Neck: Normal range of motion ?Cardiovascular: Normal rate ?Pulmonary/chest: Effort normal ?Neurologic: Patient is alert ?Skin: Skin is warm ?Psychiatric: Patient has normal mood and affect ? ? ?Ortho Exam: Ortho exam demonstrates ability to maintain almost level pelvis standing with single leg heel stance on both the right and left side.  Does have a Trendelenburg type gait.  Mildly positive nerve root tension signs on the right negative on the left.  No definite paresthesias L1-S1 bilaterally.  Patient has 5 out of 5 hip abduction adduction flexion as well as ankle dorsiflexion and plantarflexion strength.  No groin pain with internal or external rotation of either leg. ? ?Specialty Comments:  ?MRI LUMBAR SPINE WITHOUT CONTRAST ?  ?TECHNIQUE: ?Multiplanar, multisequence MR imaging of the lumbar spine was ?performed. No intravenous contrast was administered. ?  ?COMPARISON:  Radiographs of the lumbar spine 11/08/2021. ?  ?FINDINGS: ?Segmentation: 5 lumbar vertebrae. The caudal most well-formed ?intervertebral disc space is designated L5-S1. ?  ?Alignment: Trace grade 1 retrolisthesis at L1-L2. 3 mm L4-L5 grade 1 ?anterolisthesis. Trace trace L5-S1 grade 1 anterolisthesis. ?  ?Vertebrae: Redemonstrated mild chronic T12 anterior wedge vertebral ?compression fracture. Vertebral body height is otherwise maintained. ?Mild multilevel degenerative endplate irregularity with small ?Schmorl nodes. Mild degenerative endplate edema at I14-E31, T12-L1 ?and L4-L5. Multilevel ventrolateral  osteophytes. Chronic bilateral ?L4 pars interarticularis defects. ?  ?Conus medullaris and cauda equina: Conus extends to the L1-L2 level. ?No signal abnormality within the visualized distal spinal cord. ?  ?Paraspinal and other soft tissues: Redemonstrated atrophic right ?kidney. Multiple incompletely imaged bilateral renal cysts. Atrophy ?of the lumbar paraspinal musculature. ?  ?Disc levels: ?  ?Multilevel disc degeneration. Most notably, disc degeneration is ?moderate/advanced at L4-L5. ?  ?T10-T11: This level is imaged in the sagittal plane only. Slight ?disc bulge. No significant spinal canal or foraminal stenosis. ?  ?T11-T12: No significant disc herniation or stenosis. ?  ?T12-L1: Mild facet arthrosis. No significant disc herniation or ?stenosis. ?  ?L1-L2: Trace grade 1 retrolisthesis. Very shallow broad-based right ?center/subarticular disc protrusion with associated endplate ?spurring. Minimal relative right subarticular narrowing (without ?nerve root impingement). No significant central canal stenosis or ?neural foraminal narrowing. ?  ?L2-L3: Mild facet arthrosis. No significant disc herniation or ?stenosis. ?  ?L3-L4: Slight disc bulge. Mild facet arthrosis and ligamentum flavum ?hypertrophy. No significant spinal canal or foraminal stenosis. ?  ?L4-L5: Chronic bilateral L4 pars interarticularis defects. 3 mm ?grade 1 anterolisthesis. Disc uncovering with disc bulge and ?endplate spurring. Mild facet arthrosis. No significant spinal canal ?stenosis. Bilateral neural foraminal narrowing (moderate/severe ?right, mild to moderate left). ?  ?L5-S1: Trace 2 mm grade 1 anterolisthesis. Small broad-based central ?disc protrusion. Mild facet arthrosis. The disc protrusion results ?in mild bilateral subarticular narrowing with possible subtle ?contact upon the bilateral descending S1 nerve roots (series 12, ?image 40). No significant foraminal stenosis. ?  ?IMPRESSION: ?Lumbar and lower thoracic spondylosis, as  outlined and with findings ?most notably as follows. ?  ?At L4-L5, there is moderate/advanced disc degeneration. Chronic ?bilateral L4 pars interarticularis defects with 3 mm grade 1 ?anterolisthesis. Disc uncovering with disc bulge and endplate ?spurring. Mild facet arthrosis. Bilateral neural foraminal narrowing ?(moderate/severe right, mild-to-moderate left). Correlate for right ?L4 radiculopathy. No significant spinal canal stenosis. ?  ?At L5-S1, there is trace grade 1 anterolisthesis. Small broad-based ?central disc protrusion. Mild facet arthrosis. The disc protrusion ?results in mild bilateral subarticular narrowing, and may subtly ?contact the bilateral descending S1 nerve roots. No significant ?central canal stenosis or neural foraminal narrowing. ?  ?Redemonstrated mild chronic T12 vertebral compression fracture. ?  ?Mild degenerative endplate edema at V40-G86, T12-L1 and L4-L5. ?  ?  ?Electronically Signed ?  By: Kellie Simmering D.O. ?  On: 12/28/2021 13:01 ? ?Imaging: ?CT ANGIO HEAD NECK W WO CM ? ?Result Date: 01/04/2022 ?CLINICAL DATA:  Dizziness EXAM: CT ANGIOGRAPHY HEAD AND NECK TECHNIQUE: Multidetector CT imaging of the head and neck was performed using the standard protocol during bolus administration of intravenous contrast. Multiplanar CT image reconstructions and MIPs were obtained to evaluate the vascular anatomy. Carotid stenosis measurements (when applicable) are obtained utilizing NASCET criteria, using the distal internal carotid diameter as the denominator. RADIATION DOSE REDUCTION: This exam was performed according to the departmental dose-optimization program which includes automated exposure control, adjustment of the mA and/or kV  according to patient size and/or use of iterative reconstruction technique. CONTRAST:  71m OMNIPAQUE IOHEXOL 350 MG/ML SOLN COMPARISON:  Same-day noncontrast CT head FINDINGS: CTA NECK FINDINGS Aortic arch: The aortic arch is unremarkable. The origins of the  major branch vessels are patent. The subclavian arteries are patent to the level imaged with mild scattered calcified plaque. Right carotid system: There is scattered mixed plaque throughout the right com

## 2022-01-04 NOTE — Consult Note (Signed)
?                    NEURO HOSPITALIST CONSULT NOTE  ? ?Requestig physician: Dr. Tamera Punt ? ?Reason for Consult: Dizziness, gait unsteadiness and dysarthria ? ?History obtained from:  Patient, Daughter and Chart    ? ?HPI:                                                                                                                                         ? Antonio Serrano is an 76 y.o. male with a PMHx of CAD, chronic prostatis, CKD3, DM, HTN, HLD and chronic tobacco use who presents to the ED with a recurrent bout of dizziness associated with gait unsteadiness and dysarthria. He has had several similar episodes in the past, that have all resolved on their own. Dizziness is non-vertiginous, not presyncopal, but feels more like a type of unsteadiness on one's feet, like "floating on a rocking boat" as the best characterization that per patient matches his symptoms. He notes that during the spells of dizziness/gait unsteadiness, he has more of a tendency to fall backwards. This has recurred with gradual worsening of the intensity of the spells over the past 8 months, ever since having a right hip operation. He states that his legs have been generally weak, but that neither his right hip nor the leg weakness are responsible for the falls. He feels as though the falls are completely due to a feeling of unsteadiness (the dizziness) in his head and body that manifests suddenly and is usually mostly associated with a sensation that he is about to fall backwards. With today's episode, occurrence was after standing and starting to walk with his walker: to compensate, he leaned forward, then tried to turn, and that was when he fell backwards. His daughter found him on the floor near his recliner trying to get up ("I could not get up on my own"). He states that the dizzy sensation never occurs when he is sitting or lying down. Today's spell was accompanied by dysarthria, which persists in the ED as perceived by the examiner,  while daughter states his speech is back to baseline ("it was much worse this morning, I could not understand him). She does endorse that, if one had been able to write down what he was saying that was intelligible, it would have been grammatically correct. He also showed no evidence of difficulty comprehending his daughter's speech.  ? ?Past Medical History:  ?Diagnosis Date  ? Atherosclerosis of coronary artery   ? On chest CT->calcif in LAD and RCA  ? Chronic prostatitis   ? Very mild elevation of PSA (>4), referred to Urol where PSA repeat was 1.0 on 06/16/13.  Annual PSA repeat is all that is needed now per urologist.  Most recent 07/2015 was 0.45.  ? Chronic renal insufficiency, stage III (moderate) (HCC)   ? Baseline  GFR as of 2019= 50s.    ? Closed right hip fracture ([place]) 03/2021  ? THA  ? Diabetes mellitus with complication ([place]) Dx'd fall 2011  ? HTN (hypertension)   ? Renal/aortic doppler u/s normal 06/2010  ? Hypercalcemia   ? Hyperlipidemia 2016  ? Statin intolerant--myalgias.  Pt refuses any further trial of statin as of 2018.  ? Nonspecific abnormal electrocardiogram (ECG) (EKG) Fall 2011  ? TWI in inferior leads: myocardial perfusion scan neg and echo normal 07/2010  ? Tobacco dependence   ? UTI (lower urinary tract infection)   ? Feb 2012 (klebsiella--dx at [facility]); 03/2013 e coli.   ?  ? ?Past Surgical History:  ?Procedure Laterality Date  ? CARDIOVASCULAR STRESS TEST  07/2010  ? Myocardial perfusion scan neg/low risk.  ? CATARACT EXTRACTION  2007, 2008  ? Bilateral ([facility] on [address].  ? COLONOSCOPY  12/20/04;12/2014  ? 2016 no polyps: recall 5 yrs due to [place] of colon ca  ? ESOPHAGOGASTRODUODENOSCOPY  11/2004  ? esoph stricture/dilation  ? TOTAL HIP ARTHROPLASTY Right 04/23/2021  ? Procedure: TOTAL HIP ARTHROPLASTY ANTERIOR APPROACH;  Surgeon: Meredith Pel, MD;  Location: [facility];  Service: Orthopedics;  Laterality: Right;  Hana, C-arm, Depuy  ? TRANSTHORACIC  ECHOCARDIOGRAM  07/2010  ? EF =>55%; LA mildly dilated; trace MR/TR;   ? ? ?Family History  ?Problem Relation Age of Onset  ? Dementia Mother   ? Pneumonia Father   ?     died in 66's of pneumonia, was otherwise healthy  ? Cancer Sister   ?     colon cancer.  Died age 94  ? Diabetes Paternal Aunt   ? Diabetes Sister   ? Colon cancer Neg Hx   ?         ?Social History:  reports that he has been smoking cigarettes. He has a 12.50 pack-year smoking history. He has never used smokeless tobacco. He reports that he does not currently use alcohol. He reports that he does not use drugs. ? ?Allergies  ?Allergen Reactions  ? Bactrim [Sulfamethoxazole-Trimethoprim] Nausea Only  ? ? ?HOME MEDICATIONS:                                                                                                                     ? ?No current facility-administered medications on file prior to encounter.  ? ?Current Outpatient Medications on File Prior to Encounter  ?Medication Sig Dispense Refill  ? aspirin 81 MG EC tablet Take 81 mg by mouth daily. Swallow whole.    ? bismuth subsalicylate (PEPTO BISMOL) 262 MG/15ML suspension Take 30 mLs by mouth every 6 (six) hours as needed. Entered as standard dose    ? carvedilol (COREG) 6.25 MG tablet Take 1 tablet (6.25 mg total) by mouth 2 (two) times daily with a meal. 180 tablet 3  ? Docusate Calcium (STOOL SOFTENER PO) Take 1 tablet by mouth daily as needed (constipation).    ? furosemide (LASIX) 40 MG tablet  TAKE 1 TABLET BY MOUTH ONCE DAILY . APPOINTMENT REQUIRED FOR FUTURE REFILLS 90 tablet 3  ? gabapentin (NEURONTIN) 100 MG capsule Take 1 capsule (100 mg total) by mouth 2 (two) times daily. 60 capsule 0  ? methocarbamol (ROBAXIN) 500 MG tablet 1 po q hs prn (Patient taking differently: Take 500 mg by mouth at bedtime as needed for muscle spasms.) 30 tablet 0  ? olmesartan (BENICAR) 20 MG tablet Take 1/2 (one-half) tablet by mouth once daily 45 tablet 0  ? OVER THE COUNTER MEDICATION Take 2  tablets by mouth daily. Elderberry, zinc, and vitamin C combo medication OTC.    ? pioglitazone (ACTOS) 15 MG tablet TAKE 1/2 TO 1 (ONE-HALF TO ONE) TABLET BY MOUTH ONCE DAILY 30 tablet 0  ? TRAZODONE HCL PO Take 1 tablet by mouth See admin instructions. Every 6-8 hours    ? acetaminophen (TYLENOL) 500 MG tablet Take 1 tablet (500 mg total) by mouth every 6 (six) hours as needed for mild pain or fever (pain score 1-3 or temp > 100.5). (Patient taking differently: Take 500 mg by mouth at bedtime as needed for mild pain.)    ? esomeprazole (NEXIUM) 20 MG capsule Take 20 mg by mouth daily at 12 noon.    ? ipratropium (ATROVENT) 0.03 % nasal spray USE 2 SPRAY(S) IN EACH NOSTRIL EVERY 12 HOURS 30 mL 11  ? traMADol (ULTRAM) 50 MG tablet Take 1 tablet (50 mg total) by mouth every 8 (eight) hours as needed. (Patient taking differently: Take 50 mg by mouth every 6 (six) hours as needed.) 60 tablet 0  ? ? ? ?ROS:                                                                                                                                       ?No lateralized weakness, lateralized numbness, vision change or confusion. Other ROS as per HPI.   ? ? ?Blood pressure (!) 113/57, pulse (!) 59, temperature 98.4 ?F (36.9 ?C), temperature source Oral, resp. rate 18, height '5\' 5"'$  (1.651 m), weight 73 kg, SpO2 99 %. ? ? ?General Examination:                                                                                                      ? ?Physical Exam  ?HEENT-  [place]/AT  ?Lungs- Respirations unlabored ?Extremities- Tobacco stains to fingers bilaterally. Severe tinea pedis and onychomycosis to feet bilaterally.  ? ?Neurological Examination ?Mental Status: Alert, oriented x  5, thought content appropriate.  Speech mildly dysarthric with an ataxic quality, otherwise fluent without evidence of expressive or receptive aphasia.  Able to follow all commands without difficulty. ?Cranial Nerves: ?II: Visual fields intact bilaterally. No  extinction to DSS.   ?III,IV, VI: No ptosis. EOMI. No nystagmus.  ?V: Temp sensation equal bilaterally ?VII: Smile symmetric ?VIII: Hearing intact to conversation ?IX,X: No hoarseness or hypophonia ?XI: Symmetric ?XII: [place]

## 2022-01-04 NOTE — Progress Notes (Deleted)
OFFICE VISIT ? ?01/04/2022 ? ?CC: No chief complaint on file. ? ?Patient is a 76 y.o. male who presents for 49-monthfollow-up DM, HTN, CRI III, HLD. ?A/P as of last visit (VV): ?"#1 diabetes.  Has been well controlled.  He takes Actos daily.  Next A1c due at the end of January. ? ?2.  Hypertension.  He has had a little bit of lower blood pressure intermittently.  Seems to be doing well now on Coreg one half of a 6.25 mg pill twice a day.  He will hold off on restart of Benicar at this time. ? ?3.  Chronic renal insufficiency stage III.  This has shown stability over time.  He avoids NSAIDs and tries to hydrate well ? ?4.  Vertigo, seems exclusively positional induced.  When I initially saw him a couple of months ago for this it was not clear to me whether he was having nonspecific dizziness or vertigo, but now it seems more clear.  He does want a move forward with ENT consultation. ?  ?#5 debilitated patient, status post hip fracture and surgical repair 5 months ago.  He is finished PT.  Gradually gaining strength and back to eating and drinking normally. ?  ?#6 lung ca screening-> patient has a 35-40-pack-year history of smoking cigarettes and is currently still smoking half pack a day.  Pt does want lung cancer screening but wants to pursue this at a later time. ?  ?#7 hyperlipidemia.  Statin induced myopathy.  He declines any further trial of cholesterol medication." ? ?INTERIM HX: ?*** ? ?Glucoses: *** ?FEET: *** ? ? ?Past Medical History:  ?Diagnosis Date  ? Atherosclerosis of coronary artery   ? On chest CT->calcif in LAD and RCA  ? Chronic prostatitis   ? Very mild elevation of PSA (>4), referred to Urol where PSA repeat was 1.0 on 06/16/13.  Annual PSA repeat is all that is needed now per urologist.  Most recent 07/2015 was 0.45.  ? Chronic renal insufficiency, stage III (moderate) (HCC)   ? Baseline GFR as of 2019= 50s.    ? [place] 03[place]  ? THA  ? [place]  ? HTN (hypertension)   ? Renal/aortic doppler u/s normal 06/2010  ? Hypercalcemia   ? Hyperlipidemia 2016  ? Statin intolerant--myalgias.  Pt refuses any further trial of statin as of 2018.  ? Nonspecific abnormal electrocardiogram (ECG) (EKG) Fall 2011  ? TWI in inferior leads: myocardial perfusion scan neg and echo normal 07/2010  ? Tobacco dependence   ? UTI (lower urinary tract infection)   ? Feb 2012 (klebsiella--dx at [facility]); 03/2013 e coli.   ? ? ?Past Surgical History:  ?Procedure Laterality Date  ? CARDIOVASCULAR STRESS TEST  07/2010  ? Myocardial perfusion scan neg/low risk.  ? [facility]  2007, 2008  [facility] [address]  ? COLONOSCOPY  12/20/04;12/2014  ? 2016 no polyps: [place]  ? ESOPHAGOGASTRODUODENOSCOPY  11/2004  ? esoph stricture/dilation  ? TOTAL HIP ARTHROPLASTY Right 04/23/2021  ? Procedure: TOTAL HIP ARTHROPLASTY ANTERIOR APPROACH;  Surgeon: DMeredith Pel MD;  Location: [facility];  Service: Orthopedics;  Laterality: Right;  Hana, C-arm, Depuy  ? TRANSTHORACIC ECHOCARDIOGRAM  07/2010  ? EF =>55%; LA mildly dilated; trace MR/TR;   ? ? ?Outpatient Medications Prior to Visit  ?Medication Sig Dispense Refill  ? acetaminophen (TYLENOL) 500 MG tablet  Take 1 tablet (500 mg total) by mouth every 6 (six) hours as needed for mild pain or fever (pain score 1-3 or temp > 100.5).    ? carvedilol (COREG) 6.25 MG tablet Take 1 tablet (6.25 mg total) by mouth 2 (two) times daily with a meal. 180 tablet 3  ? esomeprazole (NEXIUM) 20 MG capsule Take 20 mg by mouth daily at 12 noon.    ? furosemide (LASIX) 40 MG tablet TAKE 1 TABLET BY MOUTH ONCE DAILY . APPOINTMENT REQUIRED FOR FUTURE REFILLS 90 tablet 3  ? gabapentin (NEURONTIN) 100 MG capsule Take 1 capsule (100 mg total) by mouth 2 (two) times daily. 60 capsule 0  ? ipratropium (ATROVENT) 0.03 % nasal spray USE 2 SPRAY(S) IN EACH NOSTRIL EVERY 12 HOURS 30 mL 11  ?  methocarbamol (ROBAXIN) 500 MG tablet 1 po q hs prn 30 tablet 0  ? olmesartan (BENICAR) 20 MG tablet Take 1/2 (one-half) tablet by mouth once daily (Patient not taking: Reported on 07/13/2021) 45 tablet 0  ? pioglitazone (ACTOS) 15 MG tablet TAKE 1/2 TO 1 (ONE-HALF TO ONE) TABLET BY MOUTH ONCE DAILY 30 tablet 0  ? traMADol (ULTRAM) 50 MG tablet Take 1 tablet (50 mg total) by mouth every 8 (eight) hours as needed. 60 tablet 0  ? ?No facility-administered medications prior to visit.  ? ? ?Allergies  ?Allergen Reactions  ? Bactrim [Sulfamethoxazole-Trimethoprim] Nausea Only  ? ? ?ROS ?As per HPI ? ?PE: ? ?  09/13/2021  ? 12:12 PM 07/13/2021  ?  2:23 PM 04/25/2021  ?  9:01 AM  ?Vitals with BMI  ?Height  '5\' 5"'$    ?Weight 152 lbs 156 lbs   ?BMI  25.96   ?Systolic 751 700 174  ?Diastolic 62 77 78  ?Pulse 61 57 88  ? ? ? ?Physical Exam ? ?*** ? ?LABS:  ?Last CBC ?Lab Results  ?Component Value Date  ? WBC 10.2 04/25/2021  ? HGB 11.7 (L) 04/25/2021  ? HCT 34.3 (L) 04/25/2021  ? MCV 89.1 04/25/2021  ? MCH 30.4 04/25/2021  ? RDW 14.5 04/25/2021  ? PLT 195 04/25/2021  ? ?Last metabolic panel ?Lab Results  ?Component Value Date  ? GLUCOSE 99 07/13/2021  ? NA 128 (L) 07/13/2021  ? K 4.0 07/13/2021  ? CL 92 (L) 07/13/2021  ? CO2 27 07/13/2021  ? BUN 11 07/13/2021  ? CREATININE 1.28 07/13/2021  ? GFRNONAA 53 (L) 04/24/2021  ? CALCIUM 9.4 07/13/2021  ? PHOS 3.7 04/24/2021  ? PROT 7.1 04/23/2021  ? ALBUMIN 4.2 04/23/2021  ? LABGLOB 2.3 02/16/2019  ? AGRATIO 1.9 02/16/2019  ? BILITOT 0.7 04/23/2021  ? ALKPHOS 87 04/23/2021  ? AST 18 04/23/2021  ? ALT 11 04/23/2021  ? ANIONGAP 8 04/24/2021  ? ?Last lipids ?Lab Results  ?Component Value Date  ? CHOL 161 09/15/2019  ? HDL 52.00 09/15/2019  ? [place] 93 09/15/2019  ? TRIG 82.0 09/15/2019  ? CHOLHDL 3 09/15/2019  ? ?Last hemoglobin A1c ?Lab Results  ?Component Value Date  ? HGBA1C 6.3 07/13/2021  ? ?IMPRESSION AND PLAN: ? ?No problem-specific Assessment & Plan notes found for this  encounter. ? ??lung ca screening CT? ?[place]? ? ?An After Visit Summary was printed and given to the patient. ? ?FOLLOW UP: No follow-ups on file. ?3-4 mo f/u cpe ? ?Signed:  Crissie Sickles, MD           01/04/2022 ? ?

## 2022-01-04 NOTE — Telephone Encounter (Signed)
FYI  Please see below

## 2022-01-05 ENCOUNTER — Observation Stay (HOSPITAL_BASED_OUTPATIENT_CLINIC_OR_DEPARTMENT_OTHER): Payer: Medicare Other

## 2022-01-05 DIAGNOSIS — G459 Transient cerebral ischemic attack, unspecified: Secondary | ICD-10-CM | POA: Diagnosis not present

## 2022-01-05 DIAGNOSIS — E1122 Type 2 diabetes mellitus with diabetic chronic kidney disease: Secondary | ICD-10-CM

## 2022-01-05 DIAGNOSIS — I10 Essential (primary) hypertension: Secondary | ICD-10-CM | POA: Diagnosis not present

## 2022-01-05 DIAGNOSIS — N1831 Chronic kidney disease, stage 3a: Secondary | ICD-10-CM

## 2022-01-05 DIAGNOSIS — E785 Hyperlipidemia, unspecified: Secondary | ICD-10-CM

## 2022-01-05 DIAGNOSIS — I639 Cerebral infarction, unspecified: Secondary | ICD-10-CM | POA: Diagnosis not present

## 2022-01-05 LAB — ECHOCARDIOGRAM COMPLETE
AR max vel: 3.41 cm2
AV Area VTI: 3.21 cm2
AV Area mean vel: 3.3 cm2
AV Mean grad: 3 mmHg
AV Peak grad: 5.7 mmHg
Ao pk vel: 1.19 m/s
Area-P 1/2: 5.02 cm2
Height: 65 in
S' Lateral: 2.8 cm
Single Plane A4C EF: 66.7 %
Weight: 2576 oz

## 2022-01-05 LAB — LIPID PANEL
Cholesterol: 135 mg/dL (ref 0–200)
HDL: 46 mg/dL (ref >40)
LDL Cholesterol: 70 mg/dL (ref 0–99)
Total CHOL/HDL Ratio: 2.9 RATIO
Triglycerides: 95 mg/dL (ref <150)
VLDL: 19 mg/dL (ref 0–40)

## 2022-01-05 LAB — BASIC METABOLIC PANEL
Anion gap: 8 (ref 5–15)
BUN: 11 mg/dL (ref 8–23)
CO2: 23 mmol/L (ref 22–32)
Calcium: 8.6 mg/dL — ABNORMAL LOW (ref 8.9–10.3)
Chloride: 100 mmol/L (ref 98–111)
Creatinine, Ser: 1.2 mg/dL (ref 0.61–1.24)
GFR, Estimated: >60 mL/min (ref >60)
Glucose, Bld: 82 mg/dL (ref 70–99)
Potassium: 3.6 mmol/L (ref 3.5–5.1)
Sodium: 131 mmol/L — ABNORMAL LOW (ref 135–145)

## 2022-01-05 LAB — GLUCOSE, CAPILLARY
Glucose-Capillary: 65 mg/dL — ABNORMAL LOW (ref 70–99)
Glucose-Capillary: 124 mg/dL — ABNORMAL HIGH (ref 70–99)
Glucose-Capillary: 163 mg/dL — ABNORMAL HIGH (ref 70–99)

## 2022-01-05 LAB — HEMOGLOBIN A1C
Hgb A1c MFr Bld: 6.5 % — ABNORMAL HIGH (ref 4.8–5.6)
Mean Plasma Glucose: 139.85 mg/dL

## 2022-01-05 LAB — TSH: TSH: 2.256 u[IU]/mL (ref 0.350–4.500)

## 2022-01-05 MED ORDER — PERFLUTREN LIPID MICROSPHERE
1.0000 mL | INTRAVENOUS | Status: AC | PRN
  Administered 2022-01-05: 2 mL via INTRAVENOUS
  Filled 2022-01-05: qty 10

## 2022-01-05 MED ORDER — CLOPIDOGREL BISULFATE 75 MG PO TABS: 75.0000 mg | ORAL_TABLET | Freq: Every day | ORAL | 11 refills | Status: DC

## 2022-01-05 MED ORDER — ASPIRIN 81 MG PO TBEC: 81.0000 mg | DELAYED_RELEASE_TABLET | Freq: Every day | ORAL | 12 refills | Status: DC

## 2022-01-05 MED ORDER — NICOTINE 21 MG/24HR TD PT24: 21.0000 mg | MEDICATED_PATCH | Freq: Every day | TRANSDERMAL | 0 refills | Status: DC

## 2022-01-05 MED ORDER — ATORVASTATIN CALCIUM 10 MG PO TABS: 10.0000 mg | ORAL_TABLET | Freq: Every day | ORAL | 0 refills | Status: DC

## 2022-01-05 MED ORDER — ATORVASTATIN CALCIUM 10 MG PO TABS: 10.0000 mg | ORAL_TABLET | Freq: Every day | ORAL | Status: DC

## 2022-01-05 MED ORDER — CLOPIDOGREL BISULFATE 75 MG PO TABS
75.0000 mg | ORAL_TABLET | Freq: Every day | ORAL | Status: DC
  Administered 2022-01-05: 75 mg via ORAL
  Filled 2022-01-05: qty 1

## 2022-01-05 MED ORDER — PANTOPRAZOLE SODIUM 40 MG PO TBEC: 40.0000 mg | DELAYED_RELEASE_TABLET | Freq: Every day | ORAL | 2 refills | Status: DC

## 2022-01-05 MED ORDER — ASPIRIN EC 81 MG PO TBEC: 81.0000 mg | DELAYED_RELEASE_TABLET | Freq: Every day | ORAL | Status: DC

## 2022-01-05 MED ORDER — MECLIZINE HCL 12.5 MG PO TABS: 12.5000 mg | ORAL_TABLET | Freq: Two times a day (BID) | ORAL | 0 refills | Status: DC | PRN

## 2022-01-05 NOTE — Progress Notes (Addendum)
STROKE TEAM PROGRESS NOTE  ? ?INTERVAL HISTORY ?Antonio Serrano is an 76 y.o. male with a PMHx of CAD, chronic prostatitis, CKD3, DM, HTN, HLD and chronic tobacco use who presents to the ED with a recurrent bout of dizziness associated with gait unsteadiness and dysarthria. No code stroke called. Initial CTH negative. MRI was notable for chronic lacunar infarcts in the right corona radiata, bilateral basal ganglia, and thalami.  ? ?This morning the patient is seen in his room with family at bedside who provide aspects of the history. The patient is fully alert and oriented. Regarding his episode of dizziness with subsequent fall, they report this occurred after the patient stood up. The patient states that he never feels dizzy while sitting down or turning his head suddenly.  His family reports that EMS found his blood pressure to be quite low and he was given a fluid bolus en route to the hospital. They note that when EMS arrived the patient was slurring his words, but could still follow commands and was intelligible.  ? ?Vitals:  ? 01/04/22 2310 01/05/22 0336 01/05/22 0924 01/05/22 1226  ?BP: 124/62 (!) 141/73 (!) 127/57 (!) 150/68  ?Pulse: 63 61 65 66  ?Resp: '18 19 16 15  '$ ?Temp: 97.8 ?F (36.6 ?C) 98.1 ?F (36.7 ?C) 97.7 ?F (36.5 ?C) 97.9 ?F (36.6 ?C)  ?TempSrc: Oral Oral Oral Oral  ?SpO2: 97% 100% 98% 100%  ?Weight:      ?Height:      ? ?CBC:  ?Recent Labs  ?Lab 01/04/22 ?1041  ?WBC 7.8  ?NEUTROABS 5.3  ?HGB 13.8  ?HCT 39.1  ?MCV 89.5  ?PLT 251  ? ?Basic Metabolic Panel:  ?Recent Labs  ?Lab 01/04/22 ?1041 01/05/22 ?0359  ?NA 128* 131*  ?K 3.3* 3.6  ?CL 94* 100  ?CO2 25 23  ?GLUCOSE 140* 82  ?BUN 11 11  ?CREATININE 1.42* 1.20  ?CALCIUM 9.0 8.6*  ? ?Lipid Panel:  ?Recent Labs  ?Lab 01/05/22 ?1610  ?CHOL 135  ?TRIG 95  ?HDL 46  ?CHOLHDL 2.9  ?VLDL 19  ?[place] 70  ? ?HgbA1c:  ?Recent Labs  ?Lab 01/05/22 ?9604  ?HGBA1C 6.5*  ? ?Urine Drug Screen:  ?Recent Labs  ?Lab 01/04/22 ?1213  ?LABOPIA NONE DETECTED  ?COCAINSCRNUR  NONE DETECTED  ?LABBENZ NONE DETECTED  ?AMPHETMU NONE DETECTED  ?THCU NONE DETECTED  ?LABBARB NONE DETECTED  ?  ?Alcohol Level  ?Recent Labs  ?Lab 01/04/22 ?1230  ?ETH <10  ? ? ?IMAGING past 24 hours ?CT ANGIO HEAD NECK W WO CM ? ?Result Date: 01/04/2022 ?CLINICAL DATA:  Dizziness EXAM: CT ANGIOGRAPHY HEAD AND NECK TECHNIQUE: Multidetector CT imaging of the head and neck was performed using the standard protocol during bolus administration of intravenous contrast. Multiplanar CT image reconstructions and MIPs were obtained to evaluate the vascular anatomy. Carotid stenosis measurements (when applicable) are obtained utilizing NASCET criteria, using the distal internal carotid diameter as the denominator. RADIATION DOSE REDUCTION: This exam was performed according to the departmental dose-optimization program which includes automated exposure control, adjustment of the mA and/or kV according to patient size and/or use of iterative reconstruction technique. CONTRAST:  83m OMNIPAQUE IOHEXOL 350 MG/ML SOLN COMPARISON:  Same-day noncontrast CT head FINDINGS: CTA NECK FINDINGS Aortic arch: The aortic arch is unremarkable. The origins of the major branch vessels are patent. The subclavian arteries are patent to the level imaged with mild scattered calcified plaque. Right carotid system: There is scattered mixed plaque throughout the right common carotid artery without  hemodynamically significant stenosis. There is mixed plaque at the bifurcation resulting in less than 50% stenosis of the proximal internal carotid artery. The distal right internal carotid artery is widely patent. The right external carotid artery is patent with mild stenosis at the origin. There is no evidence of dissection or aneurysm. Left carotid system: There is scattered mild plaque in the left common carotid artery without hemodynamically significant stenosis or occlusion. There is mild mixed plaque at the bifurcation resulting in less than 50%  stenosis of the proximal internal carotid artery. The distal left internal carotid artery is widely patent. The left external carotid artery is patent. There is no evidence of dissection or aneurysm. Vertebral arteries: The vertebral arteries are patent, without hemodynamically significant stenosis or occlusion. There is no evidence of dissection or aneurysm. Skeleton: There is mild multilevel degenerative change of the cervical spine. There is no acute osseous abnormality or aggressive osseous lesion. There is no visible canal hematoma. Other neck: The soft tissues are unremarkable. Upper chest: There is mild scarring in the right lung apex. The imaged lung apices are otherwise clear. Review of the MIP images confirms the above findings CTA HEAD FINDINGS Anterior circulation: There is calcified atherosclerotic plaque in the intracranial ICAs without hemodynamically significant stenosis. The bilateral MCAs are patent. The bilateral ACAs are patent. The anterior communicating artery is normal. There is no aneurysm or AVM. Posterior circulation: The bilateral V4 segments are patent. PICA is identified bilaterally. The basilar artery is patent. The bilateral PCAs are patent. The left posterior communicating artery is identified. The right posterior communicating artery is not definitely seen. There is no aneurysm or AVM. Venous sinuses: Suboptimally evaluated due to bolus timing. Anatomic variants: None. Review of the MIP images confirms the above findings IMPRESSION: 1. Mixed plaque of the carotid bifurcations resulting in less than 50% stenosis bilaterally. 2. Patent vertebral arteries in the neck. 3. Calcified plaque in the intracranial ICAs without hemodynamically significant stenosis. Otherwise, patent intracranial vasculature. Electronically Signed   By: Valetta Mole M.D.   On: 01/04/2022 15:05  ? ?MR BRAIN WO CONTRAST ? ?Result Date: 01/04/2022 ?CLINICAL DATA:  Stroke follow-up. Dizziness, altered speech, and  altered gait. EXAM: MRI HEAD WITHOUT CONTRAST TECHNIQUE: Multiplanar, multiecho pulse sequences of the brain and surrounding structures were obtained without intravenous contrast. COMPARISON:  Head CT and CTA 01/04/2022 FINDINGS: Brain: There is no evidence of an acute infarct, intracranial hemorrhage, mass, midline shift, or extra-axial fluid collection. Patchy T2 hyperintensities in the cerebral white matter and pons are nonspecific but compatible with moderate chronic small vessel ischemic disease. Chronic lacunar infarcts are noted in the right corona radiata, bilateral basal ganglia, and thalami. There is moderate cerebral atrophy. Vascular: Major intracranial vascular flow voids are preserved. Skull and upper cervical spine: Unremarkable bone marrow signal. Sinuses/Orbits: Bilateral cataract extraction. Small right and moderate left mastoid effusions. No significant inflammatory changes in the paranasal sinuses. Other: None. IMPRESSION: 1. No acute intracranial abnormality. 2. Moderate chronic small vessel ischemic disease with chronic lacunar infarcts as above. 3. Cerebral atrophy (ICD10-G31.9). Electronically Signed   By: Logan Bores M.D.   On: 01/04/2022 16:43  ? ?ECHOCARDIOGRAM COMPLETE ? ?Result Date: 01/05/2022 ?   ECHOCARDIOGRAM REPORT   Patient Name:   TRACE WIRICK Date of Exam: 01/05/2022 Medical Rec #:  [id number]     Height:       65.0 in Accession #:    [id number]    Weight:       161.0 lb  Date of Birth:  1946/04/07     BSA:          1.804 m? Patient Age:    38 years      BP:           141/73 mmHg Patient Gender: M             HR:           65 bpm. Exam Location:  Inpatient Procedure: 2D Echo, Cardiac Doppler, Color Doppler and Intracardiac            Opacification Agent Indications:    CVA  History:        Patient has prior history of Echocardiogram examinations, most                 recent 07/26/2010. Risk Factors:Diabetes, Hypertension and                 Dyslipidemia.  Sonographer:    Luisa Hart  RDCS Referring Phys: [id number] Lequita Halt  Sonographer Comments: Technically difficult study due to poor echo windows. IMPRESSIONS  1. Left ventricular ejection fraction, by estimation, is 60 to 65%. The left ve

## 2022-01-05 NOTE — Evaluation (Signed)
Physical Therapy Evaluation ?Patient Details ?Name: Antonio Serrano ?MRN: [id number] ?DOB: 1946/06/20 ?Today's Date: 01/05/2022 ? ?History of Present Illness ? Pt. is a 76 y.o. male presenting to [facility] on 4/6 with recurrent dizziness associated with gait unsteadiness and dysarthria. He has had similar episodes but notes it has been gradually worsening over the past 8 months. MRI shows no acute intracranial abnormality and chronic small vessel ischemic disease. CTA shows mixed plaque of carotid with less than 50% stenosis B, calcified plaque in ICAs w/o hemodynamically significant stenosis. PMH significant for CAD, chronic prostatis, CKD3, DM, HTN, HLD and chronic renal insufficiency, R THA 03/2021.  ?Clinical Impression ? Pt presents with decreased transfer ability, static and dynamic balance, insight to deficits, strength, and functional endurance secondary to diagnosis above. These impairments are limiting his ability to safely and independently transfer, get into his home, perform all adls/iadls, and ambulate in the community. Pt to benefit from acute PT to address deficits. Repeated tranfers, reactive balance, vestibular testing, and gait performed and pt ambulated 150 feet with RW. During reactive balance testing the pt had appropriate stepping responses and reactions to balance disturbances. He did complain of slight dizziness that comes and goes, during vestibular testing he was found to have a L sided hypofunction (see chart below). He was educated on x1 vestibular exercises and dosage as well compensatory strategies during positional changes.  Pt. Responded well  and accepted education. SPT recommends HH follow up for further reactive and dynamic balance, functional strength, and gait training once medically stable for d/c. Marland Kitchen PT to progress mobility as tolerated, and will continue to follow acutely.  ? ?Vestibular Grid ? ?Positional -  ?Smooth Pursuits -  ?Gaze Invoked Nystagmus + B  ?Saccades Horizontal L overshoot   ?Saccades Vertical L overshoot  ?Convergence unable  ?Slow VOR L slip  ?Cover/uncover -  ?Head impulse Test L slip  ?VOR cancellation  N/a  ? ? ?   ?   ? ?Recommendations for follow up therapy are one component of a multi-disciplinary discharge planning process, led by the attending physician.  Recommendations may be updated based on patient status, additional functional criteria and insurance authorization. ? ?Follow Up Recommendations Home health PT ? ?  ?Assistance Recommended at Discharge Set up Supervision/Assistance  ?Patient can return home with the following ? A little help with walking and/or transfers;A little help with bathing/dressing/bathroom;Direct supervision/assist for financial management;Assist for transportation;Help with stairs or ramp for entrance ? ?  ?Equipment Recommendations None recommended by PT  ?   ?Functional Status Assessment Patient has had a recent decline in their functional status and demonstrates the ability to make significant improvements in function in a reasonable and predictable amount of time.  ? ?  ?Precautions / Restrictions Precautions ?Precautions: Fall ?Restrictions ?Weight Bearing Restrictions: No  ? ?  ? ?Mobility ? Bed Mobility ?Overal bed mobility: Needs Assistance ?  ?  ?  ?  ?  ?  ?General bed mobility comments: Pt. hand off from OT at door of room ?  ? ?Transfers ?Overall transfer level: Needs assistance ?Equipment used: Rolling walker (2 wheels) ?Transfers: Sit to/from Stand ?Sit to Stand: Min assist ?  ?  ?  ?  ?  ?General transfer comment: Pt. had poor ecentric control of lowering, performed STS with minA for power up and steady, pulls on the middle of the walker, requires A for control to lower ?  ? ?Ambulation/Gait ?Ambulation/Gait assistance: Min guard ?Gait Distance (Feet): 150 Feet ?Assistive device:  Rolling walker (2 wheels) ?Gait Pattern/deviations: Step-through pattern, Drifts right/left, Trunk flexed ?Gait velocity: decreased ?  ?  ?General Gait  Details: Pt. shows mild drift during gait and tends to walk where his gaze is fixating ? ? ? ?Modified Rankin (Stroke Patients Only) ?Modified Rankin (Stroke Patients Only) ?Pre-Morbid Rankin Score: Slight disability ?Modified Rankin: Moderate disability ? ?  ? ?Balance Overall balance assessment: Needs assistance ?Sitting-balance support: No upper extremity supported, Feet supported ?Sitting balance-Leahy Scale: Good ?Sitting balance - Comments: Able to sit upright and maintain balance during vestibular testing ?Postural control: Posterior lean ?Standing balance support: Bilateral upper extremity supported, Reliant on assistive device for balance ?Standing balance-Leahy Scale: Poor ?Standing balance comment: Pt. requires RW for all standing balance ?  ?  ?  ?  ?  ?  ?High level balance activites: Other (comment), Backward walking (Multidirection Perturbations) ?High Level Balance Comments: Pt. shows good stability during retro-gait, with decreased speed. During standing perturbations he was able to perform appropriate stepping response in all directions to prevent LOB ?  ?  ?  ?   ? ? ? ?Pertinent Vitals/Pain Pain Assessment ?Pain Assessment: Faces ?Faces Pain Scale: Hurts a little bit ?Pain Location: R hip ?Pain Descriptors / Indicators: Constant, Aching ?Pain Intervention(s): Limited activity within patient's tolerance, Monitored during session  ? ? ?Home Living Family/patient expects to be discharged to:: Private residence ?Living Arrangements: Spouse/significant other ?Available Help at Discharge: Family ?Type of Home: House ?Home Access: Stairs to enter ?Entrance Stairs-Rails: Right;Left ?Entrance Stairs-Number of Steps: 4 ?  ?Home Layout: One level ?Home Equipment: Conservation officer, nature (2 wheels) ?   ?  ?Prior Function Prior Level of Function : Independent/Modified Independent ?  ?  ?  ?  ?  ?  ?Mobility Comments: Mainly stays in house, daughter does grocery shopping, wife does most of the cooking ?ADLs Comments:  Independent ?  ? ? ?Hand Dominance  ? Dominant Hand: Right ? ?  ?Extremity/Trunk Assessment  ? Upper Extremity Assessment ?Upper Extremity Assessment: Defer to OT evaluation ?  ? ?Lower Extremity Assessment ?Lower Extremity Assessment: Generalized weakness ?  ? ?Cervical / Trunk Assessment ?Cervical / Trunk Assessment: Normal  ?Communication  ? Communication: No difficulties  ?Cognition Arousal/Alertness: Awake/alert ?Behavior During Therapy: [facility] for tasks assessed/performed ?Overall Cognitive Status: No family/caregiver present to determine baseline cognitive functioning ?  ?  ?  ?  ?  ?  ?  ?  ?  ?  ?  ?  ?  ?  ?  ?  ?General Comments: Very talkative, has to be redirected to the task, slowed processing, and requires repeated cuing for commands. Suspect this is close to baseline. ?  ?  ? ?  ?General Comments General comments (skin integrity, edema, etc.): Pt. reports mild dizziness that comes during positional changes, describes it as coming and going, just a sense of off balance, not room spinning. Vestibular exam done, found to have L sided hypofunction ? ?  ?Exercises Other Exercises ?Other Exercises: X1 vestibular exercises: 3x:30, educated 3 times/day  ? ?Assessment/Plan  ?  ?PT Assessment Patient needs continued PT services  ?PT Problem List Decreased strength;Decreased mobility;Decreased range of motion;Decreased safety awareness;Decreased coordination;Decreased activity tolerance;Decreased balance;Decreased knowledge of use of DME ? ?   ?  ?PT Treatment Interventions DME instruction;Therapeutic exercise;Gait training;Balance training;Stair training;Functional mobility training;Neuromuscular re-education;Therapeutic activities;Patient/family education   ? ?PT Goals (Current goals can be found in the Care Plan section)  ?Acute Rehab PT Goals ?Patient Stated Goal: Return  Home ?PT Goal Formulation: With patient ?Time For Goal Achievement: 01/19/22 ?Potential to Achieve Goals: Good ? ?  ?Frequency Min 4X/week ?   ? ? ?   ?AM-PAC PT "6 Clicks" Mobility  ?Outcome Measure Help needed turning from your back to your side while in a flat bed without using bedrails?: A Little ?Help needed moving from lying on your back

## 2022-01-05 NOTE — Discharge Summary (Addendum)
?Physician Discharge Summary ?  ?Patient: Antonio Serrano MRN: [id number] DOB: [birth year]  ?Admit date:     01/04/2022  ?Discharge date: 01/05/22  ?Discharge Physician: Corrie Mckusick Willow Reczek  ? ?PCP: Tammi Sou, MD  ? ?Recommendations at discharge:  ? ?Follow-up with your primary care provider in 1 week.  Check CBC BMP magnesium phosphorus LFT in the next visit.  Patient will need to monitor sodium closely as outpatient since patient is on diuretic and other medications ?Patient will benefit from follow-up with ENT as outpatient for vertigo and dizziness. ? ?Discharge Diagnoses: ?Principal Problem: ?  TIA (transient ischemic attack) ?Active Problems: ?  HTN (hypertension), benign ?  Chronic renal insufficiency, stage III (moderate) (HCC) ?  Type 2 diabetes mellitus with stage 3a chronic kidney disease, without long-term current use of insulin ([place]) ?  Hyperlipidemia ?  Hyponatremia ? ?Resolved Problems: ?  * No resolved hospital problems. * ? ?Hospital Course: ? Antonio Serrano is a 76 y.o. male with past medical history of nonobstructive CAD, type 2 diabetes, hypertension, chronic hyponatremia, chronic ambulatory dysfunction, degenerative arthritis, hyperlipidemia, CKD stage II presented to hospital with dizziness and slight weakness on the right side.  Patient does have history of chronic vertigo.  There was some concern for garbled speech.  Subsequently speech improved.  Patient has a history of right hip chronic pain.  In the ED, CT head was performed which was negative for acute findings.  CT angiogram did not show any significant stenosis.  Patient was then admitted hospital for further evaluation and treatment.  ? ?Assessment/Plan ? ?Dysarthria, ?Possibility of TIA.  Resolved at this time.  MRI of the brain without any acute findings.  TSH was 2.2.  Lipid profile noted with LDL of 70.  Hemoglobin A1c of 6.5.  Urine drug screen was negative.  COVID and influenza was negative.  Continue aspirin, and statins.   Possibility of gabapentin/tramadol causing some side effects as well.  Urinalysis showed white cells but no nitrite and patient was asymptomatic.  Communicated with neurology prior to discharge.  We will continue aspirin Plavix for 3 weeks followed by Plavix alone. ?  ?Vertigo, acute on chronic ?Patient was advised to follow-up with ENT as outpatient.  Meclizine will be prescribed discharge.  Denies dizziness at this time.  Advised precautions during changing position. ?  ?Chronic hyponatremia ?Patient appears euvolemic.  Sodium improved to 131 at this time.  Will need outpatient follow-up.  Patient is on Lasix 40 mg daily at home.  Patient also takes olmesartan.  Will need periodic monitoring. ? ?Essential hypertension ?On Coreg and Lasix at home.  Will be resumed on discharge. ?  ?CKD stage IIIa ?  Patient on Lasix as outpatient.  On olmesartan as well. ?  ?Hypokalemia ?Improved after replacement.  Potassium prior to discharge was 3.6. ?  ?Chronic right hip pain ?Can follow-up with orthopedic surgery as outpatient.  Encouraged to reduce sedating medications as outpatient. ? ?Chronic cigarette smoking ?Received nicotine patch during hospitalization.  Will be prescribed on discharge. ? ?Consultants: Neurology ?Procedures performed: None ?Disposition: Home with home health ? ?Diet recommendation:  ?Discharge Diet Orders (From admission, onward)  ? ?  Start     Ordered  ? 01/05/22 0000  Diet Carb Modified       ? 01/05/22 1334  ? ?  ?  ? ?  ? ?Carb modified diet ?DISCHARGE MEDICATION: ?Allergies as of 01/05/2022   ? ?   Reactions  ? Bactrim [sulfamethoxazole-trimethoprim] Nausea Only  ? ?  ? ?  ?  Medication List  ?  ? ?STOP taking these medications   ? ?esomeprazole 20 MG capsule ?Commonly known as: Concepcion ?  ? ?  ? ?TAKE these medications   ? ?acetaminophen 500 MG tablet ?Commonly known as: TYLENOL ?Take 1 tablet (500 mg total) by mouth every 6 (six) hours as needed for mild pain or fever (pain score 1-3 or temp >  100.5). ?What changed:  ?when to take this ?reasons to take this ?  ?aspirin 81 MG EC tablet ?Take 1 tablet (81 mg total) by mouth daily for 21 days. Swallow whole. ?  ?atorvastatin 10 MG tablet ?Commonly known as: LIPITOR ?Take 1 tablet (10 mg total) by mouth daily. ?  ?bismuth subsalicylate 662 HU/76LY suspension ?Commonly known as: PEPTO BISMOL ?Take 30 mLs by mouth every 6 (six) hours as needed. Entered as standard dose ?  ?carvedilol 6.25 MG tablet ?Commonly known as: COREG ?Take 1 tablet (6.25 mg total) by mouth 2 (two) times daily with a meal. ?  ?clopidogrel 75 MG tablet ?Commonly known as: Plavix ?Take 1 tablet (75 mg total) by mouth daily. ?  ?furosemide 40 MG tablet ?Commonly known as: LASIX ?TAKE 1 TABLET BY MOUTH ONCE DAILY . APPOINTMENT REQUIRED FOR FUTURE REFILLS ?  ?gabapentin 100 MG capsule ?Commonly known as: NEURONTIN ?Take 1 capsule (100 mg total) by mouth 2 (two) times daily. ?  ?ipratropium 0.03 % nasal spray ?Commonly known as: ATROVENT ?USE 2 SPRAY(S) IN EACH NOSTRIL EVERY 12 HOURS ?  ?meclizine 12.5 MG tablet ?Commonly known as: ANTIVERT ?Take 1 tablet (12.5 mg total) by mouth 2 (two) times daily as needed for dizziness. ?  ?methocarbamol 500 MG tablet ?Commonly known as: ROBAXIN ?1 po q hs prn ?What changed:  ?how much to take ?how to take this ?when to take this ?reasons to take this ?additional instructions ?  ?nicotine 21 mg/24hr patch ?Commonly known as: NICODERM CQ - dosed in mg/24 hours ?Place 1 patch (21 mg total) onto the skin daily. ?  ?olmesartan 20 MG tablet ?Commonly known as: BENICAR ?Take 1/2 (one-half) tablet by mouth once daily ?  ?OVER THE COUNTER MEDICATION ?Take 2 tablets by mouth daily. Elderberry, zinc, and vitamin C combo medication OTC. ?  ?pantoprazole 40 MG tablet ?Commonly known as: Protonix ?Take 1 tablet (40 mg total) by mouth daily. ?  ?pioglitazone 15 MG tablet ?Commonly known as: ACTOS ?TAKE 1/2 TO 1 (ONE-HALF TO ONE) TABLET BY MOUTH ONCE DAILY ?  ?STOOL  SOFTENER PO ?Take 1 tablet by mouth daily as needed (constipation). ?  ?traMADol 50 MG tablet ?Commonly known as: ULTRAM ?Take 1 tablet (50 mg total) by mouth every 8 (eight) hours as needed. ?What changed: when to take this ?  ?TRAZODONE HCL PO ?Take 1 tablet by mouth See admin instructions. Every 6-8 hours ?  ? ?  ? ?Subjective ?Today, patient was seen and examined at bedside.  Feels okay.  Denies any dizziness lightheadedness weakness. ? ?Discharge Exam: ?Filed Weights  ? 01/04/22 1026  ?Weight: 73 kg  ? ? ?  01/05/2022  ? 12:26 PM 01/05/2022  ?  9:24 AM 01/05/2022  ?  3:36 AM  ?Vitals with BMI  ?Systolic 650 354 656  ?Diastolic 68 57 73  ?Pulse 66 65 61  ?  ?General:  Average built, not in obvious distress ?HENT:   No scleral pallor or icterus noted. Oral mucosa is moist.  ?Chest:  Clear breath sounds.  Diminished breath sounds bilaterally. No crackles or wheezes.  ?  CVS: S1 &S2 heard. No murmur.  Regular rate and rhythm. ?Abdomen: Soft, nontender, nondistended.  Bowel sounds are heard.   ?Extremities: No cyanosis, clubbing or edema.  Peripheral pulses are palpable. ?Psych: Alert, awake and oriented, normal mood ?CNS:  No cranial nerve deficits.  Power equal in all extremities.   ?Skin: Warm and dry.  No rashes noted. ? ?Condition at discharge: good ? ?The results of significant diagnostics from this hospitalization (including imaging, microbiology, ancillary and laboratory) are listed below for reference.  ? ?Imaging Studies: ?CT ANGIO HEAD NECK W WO CM ? ?Result Date: 01/04/2022 ?CLINICAL DATA:  Dizziness EXAM: CT ANGIOGRAPHY HEAD AND NECK TECHNIQUE: Multidetector CT imaging of the head and neck was performed using the standard protocol during bolus administration of intravenous contrast. Multiplanar CT image reconstructions and MIPs were obtained to evaluate the vascular anatomy. Carotid stenosis measurements (when applicable) are obtained utilizing NASCET criteria, using the distal internal carotid diameter as the  denominator. RADIATION DOSE REDUCTION: This exam was performed according to the departmental dose-optimization program which includes automated exposure control, adjustment of the mA and/or kV according to patient size a

## 2022-01-05 NOTE — Care Management Obs Status (Signed)
MEDICARE OBSERVATION STATUS NOTIFICATION ? ? ?Patient Details  ?Name: Antonio Serrano ?MRN: [id number] ?Date of Birth: 1945/11/19 ? ? ?Medicare Observation Status Notification Given:  Yes ? ? ? ?Pollie Friar, RN ?01/05/2022, 1:25 PM ?

## 2022-01-05 NOTE — Hospital Course (Addendum)
Antonio Serrano is a 76 y.o. male with past medical history of nonobstructive CAD, type 2 diabetes, hypertension, chronic hyponatremia, chronic ambulatory dysfunction, degenerative arthritis, hyperlipidemia, CKD stage II presented to hospital with dizziness and slight weakness on the right side.  Patient does have history of chronic vertigo.  There was some concern for garbled speech.  Subsequently speech improved.  Patient has a history of right hip chronic pain.  In the ED, CT head was performed which was negative for acute findings.  CT angiogram did not show any significant stenosis.  Patient was then admitted hospital for further evaluation and treatment.  ? ?Assessment/Plan ? ?Dysarthria, ?Possibility of TIA.  Resolved at this time.  MRI of the brain without any acute findings.  TSH was 2.2.  Lipid profile noted with LDL of 70.  Hemoglobin A1c of 6.5.  Urine drug screen was negative.  COVID and influenza was negative.  Continue aspirin, and statins.  Possibility of gabapentin/tramadol causing some side effects as well.  Urinalysis showed white cells but no nitrite and patient was asymptomatic.  Communicated with neurology prior to discharge.  We will continue aspirin Plavix for 3 weeks followed by Plavix alone. ?  ?Vertigo, acute on chronic ?Patient was advised to follow-up with ENT as outpatient.  Meclizine will be prescribed discharge.  Denies dizziness at this time.  Advised precautions during changing position. ?  ?Chronic hyponatremia ?Patient appears euvolemic.  Sodium improved to 131 at this time.  Will need outpatient follow-up.  Patient is on Lasix 40 mg daily at home.  Patient also takes olmesartan.  Will need periodic monitoring. ? ?Essential hypertension ?On Coreg and Lasix at home.  Will be resumed on discharge. ?  ?CKD stage IIIa ?  Patient on Lasix as outpatient.  On olmesartan as well. ?  ?Hypokalemia ?Improved after replacement.  Potassium prior to discharge was 3.6. ?  ?Chronic right hip  pain ?Can follow-up with orthopedic surgery as outpatient.  Encouraged to reduce sedating medications as outpatient. ? ?Chronic cigarette smoking ?Received nicotine patch during hospitalization.  Will be prescribed on discharge. ?

## 2022-01-05 NOTE — Progress Notes (Signed)
Inpatient Diabetes Program Recommendations ? ?AACE/ADA: New Consensus Statement on Inpatient Glycemic Control (2015) ? ?Target Ranges:  Prepandial:   less than 140 mg/dL ?     Peak postprandial:   less than 180 mg/dL (1-2 hours) ?     Critically ill patients:  140 - 180 mg/dL  ? ?Lab Results  ?Component Value Date  ? GLUCAP 65 (L) 01/05/2022  ? HGBA1C 6.5 (H) 01/05/2022  ? ? Latest Reference Range & Units 01/04/22 18:51 01/04/22 21:18 01/05/22 06:25  ?Glucose-Capillary 70 - 99 mg/dL 97 167 (H) 65 (L)  ?(H): Data is abnormally high ?(L): Data is abnormally low ?Review of Glycemic Control ? ?Diabetes history: type 2 ?Outpatient Diabetes medications: Actos 15 mg tablet 1/2-1 tab daily as needed ?Current orders for Inpatient glycemic control: Novolog 0-15 units correction scale TID ? ?Inpatient Diabetes Program Recommendations:   ?Noted that patient had low CBG of 65 mg/dl this am. Has not received any insulin since admission.  ? ?Recommend changing correction scale to SENSITIVE (0-9 units) TID. ? ?Harvel Ricks RN BSN CDE ?Diabetes Coordinator ?Pager: [phone]  8am-5pm  ? ? ? ?

## 2022-01-05 NOTE — TOC Transition Note (Signed)
Transition of Care (TOC) - CM/SW Discharge Note ? ? ?Patient Details  ?Name: Tyheim Vanalstyne ?MRN: [id number] ?Date of Birth: [birth year] ? ?Transition of Care (TOC) CM/SW Contact:  ?Pollie Friar, RN ?Phone Number: ?01/05/2022, 1:46 PM ? ? ?Clinical Narrative:    ?Patient is discharging home with home health services through [facility]. Information on the AVS.  ?Pt has walker/ cane / shower seat at home.  ?Pts daughter provides needed transportation. She also manages his medications by filling his pill box at home.  ?Pt lives with spouse and has needed supervision at home.  ?Daughters to provide transport home.  ? ? ?Final next level of care: [place] ?Barriers to Discharge: No Barriers Identified ? ? ?Patient Goals and CMS Choice ?  ?CMS Medicare.gov Compare Post Acute Care list provided to:: Patient ?Choice offered to / list presented to : Patient ? ?Discharge Placement ?  ?           ?  ?  ?  ?  ? ?Discharge Plan and Services ?  ?  ?           ?  ?  ?  ?  ?  ?HH Arranged: RN, PT, OT ?[place] Agency: [facility] ?Date HH Agency Contacted: 01/05/22 ?  ?Representative spoke with at [place]: Delsa Sale ? ?Social Determinants of Health (SDOH) Interventions ?  ? ? ?Readmission Risk Interventions ?   ? View : No data to display.  ?  ?  ?  ? ? ? ? ? ?

## 2022-01-05 NOTE — Evaluation (Signed)
Occupational Therapy Evaluation ?Patient Details ?Name: Antonio Serrano ?MRN: [id number] ?DOB: Jan 24, 1946 ?Today's Date: 01/05/2022 ? ? ?History of Present Illness Pt. is a 76 y.o. male presenting to [facility] on 4/6 with recurrent dizziness associated with gait unsteadiness and dysarthria. He has had similar episodes but notes it has been gradually worsening over the past 8 months. MRI shows no acute intracranial abnormality and chronic small vessel ischemic disease. CTA shows mixed plaque of carotid with less than 50% stenosis B, calcified plaque in ICAs w/o hemodynamically significant stenosis. PMH significant for CAD, chronic prostatis, CKD3, DM, HTN, HLD and chronic renal insufficiency, R THA 03/2021.  ? ?Clinical Impression ?  ?Pt admitted for concerns listed above. PTA pt reported that he was independent with all ADL's and functional mobility, using a RW or rollator. At this time, pt limited by weakness, balance deficits, vestibular concerns, and decreased activity tolerance. Overall requiring min guard to min A for safety with ADL's and functional mobility using a RW. No dizziness or LoB this session. Recommending [place] services to maximize safety and independence. OT will follow acutely.  ?   ? ?Recommendations for follow up therapy are one component of a multi-disciplinary discharge planning process, led by the attending physician.  Recommendations may be updated based on patient status, additional functional criteria and insurance authorization.  ? ?Follow Up Recommendations ? Home health OT  ?  ?Assistance Recommended at Discharge Set up Supervision/Assistance  ?Patient can return home with the following A little help with walking and/or transfers;A little help with bathing/dressing/bathroom;Assistance with cooking/housework ? ?  ?Functional Status Assessment ? Patient has had a recent decline in their functional status and demonstrates the ability to make significant improvements in function in a reasonable and  predictable amount of time.  ?Equipment Recommendations ? None recommended by OT  ?  ?Recommendations for Other Services   ? ? ?  ?Precautions / Restrictions Precautions ?Precautions: Fall ?Restrictions ?Weight Bearing Restrictions: No  ? ?  ? ?Mobility Bed Mobility ?Overal bed mobility: Modified Independent ?  ?  ?  ?  ?  ?  ?General bed mobility comments: No difficulties rising to sit EOB ?  ? ?Transfers ?Overall transfer level: Needs assistance ?Equipment used: Rolling walker (2 wheels) ?Transfers: Sit to/from Stand ?Sit to Stand: Min guard ?  ?  ?  ?  ?  ?General transfer comment: Pt able to power up frombed in lowest setting and toilet with no physical assist. Min guard as pt reports he feels like he is leaning posteriorly when he stands up. ?  ? ?  ?Balance Overall balance assessment: Needs assistance ?Sitting-balance support: No upper extremity supported, Feet supported ?Sitting balance-Leahy Scale: Good ?  ?Postural control: Posterior lean ?Standing balance support: Bilateral upper extremity supported, Reliant on assistive device for balance ?Standing balance-Leahy Scale: Poor ?Standing balance comment: Pt reporting he feels like he is posteriorly leaning when he stands, however it is minor and he is able to correct. ?  ?  ?  ?  ?  ?  ?  ?  ?  ?  ?  ?   ? ?ADL either performed or assessed with clinical judgement  ? ?ADL Overall ADL's : Needs assistance/impaired ?Eating/Feeding: Independent;Sitting ?  ?Grooming: Min guard;Standing ?  ?Upper Body Bathing: Min guard;Sitting ?  ?Lower Body Bathing: Minimal assistance;Sitting/lateral leans ?  ?Upper Body Dressing : Min guard;Sitting ?  ?Lower Body Dressing: Minimal assistance;Sitting/lateral leans ?  ?Toilet Transfer: Minimal assistance;Ambulation ?  ?Toileting- Clothing Manipulation and  Hygiene: Minimal assistance;Sitting/lateral lean ?  ?  ?  ?Functional mobility during ADLs: Min guard;Minimal assistance;Rolling walker (2 wheels) ?General ADL Comments: Overall  min guard to min A, min A with LE ADL's due to hip pain/weakness from prior surgery  ? ? ? ?Vision Baseline Vision/History: 1 Wears glasses ?Ability to See in Adequate Light: 1 Impaired ?Patient Visual Report: No change from baseline ?Vision Assessment?: No apparent visual deficits  ?   ?Perception   ?  ?Praxis   ?  ? ?Pertinent Vitals/Pain Pain Assessment ?Pain Assessment: No/denies pain  ? ? ? ?Hand Dominance Right ?  ?Extremity/Trunk Assessment Upper Extremity Assessment ?Upper Extremity Assessment: Overall WFL for tasks assessed (RUE Vs. LUE close to the same) ?  ?Lower Extremity Assessment ?Lower Extremity Assessment: Defer to PT evaluation ?  ?Cervical / Trunk Assessment ?Cervical / Trunk Assessment: Kyphotic ?  ?Communication Communication ?Communication: No difficulties ?  ?Cognition Arousal/Alertness: Awake/alert ?Behavior During Therapy: [facility] for tasks assessed/performed ?Overall Cognitive Status: Within Functional Limits for tasks assessed ?  ?  ?  ?  ?  ?  ?  ?  ?  ?  ?  ?  ?  ?  ?  ?  ?  ?  ?  ?General Comments  VSS on RA ? ?  ?Exercises   ?  ?Shoulder Instructions    ? ? ?Home Living Family/patient expects to be discharged to:: Private residence ?Living Arrangements: Spouse/significant other ?Available Help at Discharge: Family ?Type of Home: House ?Home Access: Stairs to enter ?Entrance Stairs-Number of Steps: 4 ?Entrance Stairs-Rails: Right;Left ?Home Layout: One level ?  ?  ?Bathroom Shower/Tub: Walk-in shower ?  ?Bathroom Toilet: Standard ?  ?  ?Home Equipment: Conservation officer, nature (2 wheels);Shower seat;Grab bars - toilet;Grab bars - tub/shower;Rollator (4 wheels) ?  ?  ?  ? ?  ?Prior Functioning/Environment Prior Level of Function : Independent/Modified Independent;Driving ?  ?  ?  ?  ?  ?  ?Mobility Comments: Uses a RW most of the time and a rollator when he is feeling good ?ADLs Comments: Indep ?  ? ?  ?  ?OT Problem List: Decreased strength;Decreased activity tolerance;Impaired balance (sitting  and/or standing);Decreased knowledge of use of DME or AE ?  ?   ?OT Treatment/Interventions: Self-care/ADL training;Therapeutic exercise;Energy conservation;DME and/or AE instruction;Therapeutic activities;Patient/family education;Balance training  ?  ?OT Goals(Current goals can be found in the care plan section) Acute Rehab OT Goals ?Patient Stated Goal: To go home ?OT Goal Formulation: With patient ?Time For Goal Achievement: 01/19/22 ?Potential to Achieve Goals: Good ?ADL Goals ?Pt Will Perform Lower Body Bathing: with modified independence;sitting/lateral leans;sit to/from stand ?Pt Will Perform Lower Body Dressing: with modified independence;sitting/lateral leans;sit to/from stand ?Pt Will Transfer to Toilet: with modified independence;ambulating ?Pt Will Perform Toileting - Clothing Manipulation and hygiene: with modified independence;sit to/from stand;sitting/lateral leans  ?OT Frequency: Min 2X/week ?  ? ?Co-evaluation   ?  ?  ?  ?  ? ?  ?AM-PAC OT "6 Clicks" Daily Activity     ?Outcome Measure Help from another person eating meals?: None ?Help from another person taking care of personal grooming?: A Little ?Help from another person toileting, which includes using toliet, bedpan, or urinal?: A Little ?Help from another person bathing (including washing, rinsing, drying)?: A Little ?Help from another person to put on and taking off regular upper body clothing?: A Little ?Help from another person to put on and taking off regular lower body clothing?: A Little ?6 Click Score:  19 ?  ?End of Session Equipment Utilized During Treatment: Gait belt;Rolling walker (2 wheels) ?Nurse Communication: Mobility status ? ?Activity Tolerance: Patient tolerated treatment well ?Patient left: in bed;with call bell/phone within reach ? ?OT Visit Diagnosis: Unsteadiness on feet (R26.81);Other abnormalities of gait and mobility (R26.89);Muscle weakness (generalized) (M62.81)  ?              ?Time: 1712-7871 ?OT Time Calculation  (min): 13 min ?Charges:  OT General Charges ?$OT Visit: 1 Visit ?OT Evaluation ?$OT Eval Moderate Complexity: 1 Mod ? ?Triston Skare H., OTR/L ?Acute Rehabilitation ? ?Zamyra Allensworth Elane Yolanda Bonine ?01/05/2022, 12:14 PM ?

## 2022-01-07 LAB — URINE CULTURE: Culture: >=100000 — AB

## 2022-01-08 ENCOUNTER — Encounter: Payer: Self-pay | Admitting: Family Medicine

## 2022-01-08 NOTE — Progress Notes (Signed)
Noted  

## 2022-01-09 DIAGNOSIS — I2510 Atherosclerotic heart disease of native coronary artery without angina pectoris: Secondary | ICD-10-CM | POA: Diagnosis not present

## 2022-01-09 DIAGNOSIS — I129 Hypertensive chronic kidney disease with stage 1 through stage 4 chronic kidney disease, or unspecified chronic kidney disease: Secondary | ICD-10-CM | POA: Diagnosis not present

## 2022-01-09 DIAGNOSIS — N183 Chronic kidney disease, stage 3 unspecified: Secondary | ICD-10-CM | POA: Diagnosis not present

## 2022-01-09 DIAGNOSIS — Z96641 Presence of right artificial hip joint: Secondary | ICD-10-CM | POA: Diagnosis not present

## 2022-01-09 DIAGNOSIS — M5136 Other intervertebral disc degeneration, lumbar region: Secondary | ICD-10-CM | POA: Diagnosis not present

## 2022-01-09 DIAGNOSIS — Z87440 Personal history of urinary (tract) infections: Secondary | ICD-10-CM | POA: Diagnosis not present

## 2022-01-09 DIAGNOSIS — E871 Hypo-osmolality and hyponatremia: Secondary | ICD-10-CM | POA: Diagnosis not present

## 2022-01-09 DIAGNOSIS — H814 Vertigo of central origin: Secondary | ICD-10-CM | POA: Diagnosis not present

## 2022-01-09 DIAGNOSIS — Z9181 History of falling: Secondary | ICD-10-CM | POA: Diagnosis not present

## 2022-01-09 DIAGNOSIS — F17210 Nicotine dependence, cigarettes, uncomplicated: Secondary | ICD-10-CM | POA: Diagnosis not present

## 2022-01-09 DIAGNOSIS — Z8673 Personal history of transient ischemic attack (TIA), and cerebral infarction without residual deficits: Secondary | ICD-10-CM | POA: Diagnosis not present

## 2022-01-09 DIAGNOSIS — E785 Hyperlipidemia, unspecified: Secondary | ICD-10-CM | POA: Diagnosis not present

## 2022-01-09 DIAGNOSIS — M47816 Spondylosis without myelopathy or radiculopathy, lumbar region: Secondary | ICD-10-CM | POA: Diagnosis not present

## 2022-01-09 DIAGNOSIS — G8929 Other chronic pain: Secondary | ICD-10-CM | POA: Diagnosis not present

## 2022-01-09 DIAGNOSIS — Z7984 Long term (current) use of oral hypoglycemic drugs: Secondary | ICD-10-CM | POA: Diagnosis not present

## 2022-01-09 DIAGNOSIS — E1122 Type 2 diabetes mellitus with diabetic chronic kidney disease: Secondary | ICD-10-CM | POA: Diagnosis not present

## 2022-01-11 ENCOUNTER — Encounter: Payer: Self-pay | Admitting: Family Medicine

## 2022-01-11 ENCOUNTER — Ambulatory Visit (INDEPENDENT_AMBULATORY_CARE_PROVIDER_SITE_OTHER): Payer: Medicare Other | Admitting: Family Medicine

## 2022-01-11 VITALS — BP 135/75 | HR 65 | Temp 97.5°F | Ht 65.0 in | Wt 152.6 lb

## 2022-01-11 DIAGNOSIS — E878 Other disorders of electrolyte and fluid balance, not elsewhere classified: Secondary | ICD-10-CM

## 2022-01-11 DIAGNOSIS — N1831 Chronic kidney disease, stage 3a: Secondary | ICD-10-CM

## 2022-01-11 DIAGNOSIS — I10 Essential (primary) hypertension: Secondary | ICD-10-CM | POA: Diagnosis not present

## 2022-01-11 DIAGNOSIS — G459 Transient cerebral ischemic attack, unspecified: Secondary | ICD-10-CM

## 2022-01-11 DIAGNOSIS — I679 Cerebrovascular disease, unspecified: Secondary | ICD-10-CM | POA: Diagnosis not present

## 2022-01-11 MED ORDER — CLOPIDOGREL BISULFATE 75 MG PO TABS: 75.0000 mg | ORAL_TABLET | Freq: Every day | ORAL | 3 refills | Status: DC

## 2022-01-11 MED ORDER — FUROSEMIDE 40 MG PO TABS: ORAL_TABLET | ORAL | 1 refills | Status: DC

## 2022-01-11 MED ORDER — PIOGLITAZONE HCL 15 MG PO TABS: ORAL_TABLET | ORAL | 1 refills | Status: DC

## 2022-01-11 NOTE — Progress Notes (Signed)
?01/11/2022 ? ?CC:  ?Chief Complaint  ?Patient presents with  ? Hospitalization Follow-up  ? ? ?Patient is a 76 y.o. Caucasian male who presents accompanied by his daughter Bethena Roys for hospital follow up. ?Dates hospitalized: 01/04/2022 to 01/06/2022. ?Days since d/c from hospital: 5 days ?Patient was discharged from hospital to home. ?Reason for admission to hospital: Dizziness that led to a fall, garbled/slurred speech, suspicion of TIA. ? ?I have reviewed patient's discharge summary plus pertinent specific notes, labs, and imaging from the hospitalization.   ?Presented to the ED with an episode of dizziness, altered speech and altered gait.  This had led to a fall at home which did not result in any injury or loss of consciousness.  He had improved by the time he got to the emergency department.  He evaluation revealed a head CT scan without acute abnormality.  Neurology was consulted.  Neurologic exam normal with the exception of inability to perform gait or balance testing.  MRI brain without acute findings but it did show moderate chronic small vessel ischemic disease with chronic lacunar infarcts in the right corona radiata, bilateral basal ganglia, and thalami.  Moderate cerebral atrophy was noted.  CT angiogram of head and neck showed no significant large vessel occlusion/no hemodynamically significant stenosis.  EKG with normal rhythm.  Echocardiogram with EF of 60 to 65%--no significant abnormal findings. ?Potassium was 3.3 on admission and this was replaced in the hospital.  Serum creatinine 1.42 on admission and this decreased to 1.2 upon discharge (baseline serum creatinine about 1.3, GFR about 55). ?He recovered fully/back to baseline in the hospital.  He was discharged home to finish 3 weeks of Plavix plus aspirin, with the plan of transitioning to Plavix alone after that. ? ?Currently:  ?He is doing well, no new problems since discharge home. ?He describes a history of chronic intermittent disequilibrium  that can occur at any time.  Also has distinct periods of increased dizziness upon standing up.  Denies vertigo. ?He has been set up with ENT consultation appointment. ? ?He does take Lasix every day.  He has no lower extremity swelling. ?He has a chronic urinary urgency and frequency.  Says he drinks about 65 ounces of fluids a day and urinates similar amount.  Urine is light yellow.  No dysuria, no suprapubic pain.  He does sometimes feels he has sensation of incomplete emptying.  Right now he says his urine symptoms are at baseline. ? ?Medication reconciliation was done today and patient is taking meds as recommended by discharging hospitalist/specialist.   ? ?PMH:  ?Past Medical History:  ?Diagnosis Date  ? Atherosclerosis of coronary artery   ? On chest CT->calcif in LAD and RCA  ? Chronic low back pain   ? MR 11/2021 showed L4-5 foraminal stenosis--> plan per Ortho was to do L4-5 injection  ? Chronic prostatitis   ? Very mild elevation of PSA (>4), referred to Urol where PSA repeat was 1.0 on 06/16/13.  Annual PSA repeat is all that is needed now per urologist.  Most recent 07/2015 was 0.45.  ? Chronic renal insufficiency, stage III (moderate) (HCC)   ? Baseline GFR as of 2019= 50s.    ? Closed right hip fracture ([place]) 03/2021  ? THA  ? Diabetes mellitus with complication ([place]) Dx'd fall 2011  ? HTN (hypertension)   ? Renal/aortic doppler u/s normal 06/2010  ? Hypercalcemia   ? Hyperlipidemia 2016  ? Statin intolerant--myalgias.  Pt refuses any further trial of statin as  of 2018.  ? Nonspecific abnormal electrocardiogram (ECG) (EKG) Fall 2011  ? TWI in inferior leads: myocardial perfusion scan neg and echo normal 07/2010  ? Pseudomonas aeruginosa colonization   ? 12/2021 urine clx  ? TIA (transient ischemic attack)   ? 12/2021- CT angio head/neck no high grade stenosis.  MR showed old lacunar infarcts corona radiata, basal ganglia, thalamus.  Echo normal.  ? Tobacco dependence   ? UTI (lower urinary tract  infection)   ? Feb 2012 (klebsiella--dx at [facility]); 03/2013 e coli.   ? ? ?PSH:  ?Past Surgical History:  ?Procedure Laterality Date  ? CARDIOVASCULAR STRESS TEST  07/01/2010  ? Myocardial perfusion scan neg/low risk.  ? CATARACT EXTRACTION  2007, 2008  ? Bilateral ([facility] on [address].  ? COLONOSCOPY  12/20/04;12/2014  ? 2016 no polyps: recall 5 yrs due to [place] of colon ca  ? ESOPHAGOGASTRODUODENOSCOPY  11/29/2004  ? esoph stricture/dilation  ? TOTAL HIP ARTHROPLASTY Right 04/23/2021  ? Procedure: TOTAL HIP ARTHROPLASTY ANTERIOR APPROACH;  Surgeon: Meredith Pel, MD;  Location: [facility];  Service: Orthopedics;  Laterality: Right;  [place], C-arm, Depuy  ? TRANSTHORACIC ECHOCARDIOGRAM  07/01/2010  ? 2011 EF =>55%; LA mildly dilated; trace MR/TR.  12/2021 EF 60-65%, DD indeterm, valves nl.  ? ? ?MEDS:  ?Outpatient Medications Prior to Visit  ?Medication Sig Dispense Refill  ? acetaminophen (TYLENOL) 500 MG tablet Take 1 tablet (500 mg total) by mouth every 6 (six) hours as needed for mild pain or fever (pain score 1-3 or temp > 100.5). (Patient taking differently: Take 500 mg by mouth at bedtime as needed for mild pain.)    ? aspirin 81 MG EC tablet Take 1 tablet (81 mg total) by mouth daily for 21 days. Swallow whole. 30 tablet 12  ? bismuth subsalicylate (PEPTO BISMOL) 262 MG/15ML suspension Take 30 mLs by mouth every 6 (six) hours as needed. Entered as standard dose    ? carvedilol (COREG) 6.25 MG tablet Take 1 tablet (6.25 mg total) by mouth 2 (two) times daily with a meal. 180 tablet 3  ? clopidogrel (PLAVIX) 75 MG tablet Take 1 tablet (75 mg total) by mouth daily. 30 tablet 11  ? Docusate Calcium (STOOL SOFTENER PO) Take 1 tablet by mouth daily as needed (constipation).    ? furosemide (LASIX) 40 MG tablet TAKE 1 TABLET BY MOUTH ONCE DAILY . APPOINTMENT REQUIRED FOR FUTURE REFILLS 90 tablet 3  ? gabapentin (NEURONTIN) 100 MG capsule Take 1 capsule (100 mg total) by mouth 2 (two) times daily.  60 capsule 0  ? ipratropium (ATROVENT) 0.03 % nasal spray USE 2 SPRAY(S) IN EACH NOSTRIL EVERY 12 HOURS 30 mL 11  ? meclizine (ANTIVERT) 12.5 MG tablet Take 1 tablet (12.5 mg total) by mouth 2 (two) times daily as needed for dizziness. 30 tablet 0  ? olmesartan (BENICAR) 20 MG tablet Take 1/2 (one-half) tablet by mouth once daily 45 tablet 0  ? OVER THE COUNTER MEDICATION Take 2 tablets by mouth daily. Elderberry, zinc, and vitamin C combo medication OTC.    ? pantoprazole (PROTONIX) 40 MG tablet Take 1 tablet (40 mg total) by mouth daily. 30 tablet 2  ? pioglitazone (ACTOS) 15 MG tablet TAKE 1/2 TO 1 (ONE-HALF TO ONE) TABLET BY MOUTH ONCE DAILY 30 tablet 0  ? traMADol (ULTRAM) 50 MG tablet Take 1 tablet (50 mg total) by mouth every 8 (eight) hours as needed. (Patient taking differently: Take 50 mg by mouth every  6 (six) hours as needed.) 60 tablet 0  ? TRAZODONE HCL PO Take 1 tablet by mouth See admin instructions. Every 6-8 hours    ? atorvastatin (LIPITOR) 10 MG tablet Take 1 tablet (10 mg total) by mouth daily. (Patient not taking: Reported on 01/11/2022) 30 tablet 0  ? methocarbamol (ROBAXIN) 500 MG tablet 1 po q hs prn (Patient not taking: Reported on 01/11/2022) 30 tablet 0  ? nicotine (NICODERM CQ - DOSED IN MG/24 HOURS) 21 mg/24hr patch Place 1 patch (21 mg total) onto the skin daily. (Patient not taking: Reported on 01/11/2022) 28 patch 0  ? ?No facility-administered medications prior to visit.  ? ? ?Physical Exam ? ?  01/11/2022  ? 11:15 AM 01/05/2022  ? 12:26 PM 01/05/2022  ?  9:24 AM  ?Vitals with BMI  ?Height '5\' 5"'$     ?Weight 152 lbs 10 oz    ?BMI 25.39    ?Systolic 509 326 712  ?Diastolic 75 68 57  ?Pulse 65 66 65  ?BP sitting was 120/64, HR 64. ?Standing BP 110/60, HR 67. ? ?Gen: alert, NAD, appears chronically ill. ?AFFECT: pleasant, lucid thought and speech. ?Speech is clear other than a slight slur when he says it is S's. ?WPY:KDXI: no injection, icteris, swelling, or exudate.  EOMI, PERRLA. ?Mouth: lips  without lesion/swelling.  Oral mucosa pink and moist. Oropharynx without erythema, exudate, or swelling.  ?Neck: carotids 2+ bilat, no bruits. ?CV: RRR, no m/r/g.   ?LUNGS: CTA bilat, nonlabored resps, good ae

## 2022-01-16 ENCOUNTER — Telehealth: Payer: Self-pay

## 2022-01-16 NOTE — Telephone Encounter (Signed)
Received written orders for [facility] certification and POC for PT, OT and ST. Referral placed for Dr.Scott Dean on 01/01/22. Pt was last seen by PCP 01/11/22 for hospital follow up. Placed on PCP desk to review and sign, if appropriate. ? ?

## 2022-01-17 NOTE — Telephone Encounter (Signed)
Received a second written HH order regarding OT for patient.  Placed on PCP desk to review and sign, if appropriate. ?] ?

## 2022-01-18 NOTE — Telephone Encounter (Signed)
Signed and put in box to go up front. ?Signed:  Phil [place] Peddy, MD           01/18/2022 ? ?

## 2022-01-26 ENCOUNTER — Telehealth: Payer: Self-pay | Admitting: Family Medicine

## 2022-01-26 NOTE — Telephone Encounter (Signed)
Yes okay 

## 2022-01-26 NOTE — Telephone Encounter (Signed)
Okay for orders? 

## 2022-01-26 NOTE — Telephone Encounter (Signed)
HH ORDERS  ? ?Caller Name: Lenna Sciara OT ?Home Health Agency Name: [facility] ?Callback Phone #: [phone] secure line ?Service Requested: OT ?(examples: OT/PT/Skilled Nursing/Social Work/Speech Therapy/Wound Care) ?Frequency of Visits: move visit from today to next week & extend OT 1x week for 4 weeks ? ? ? ? ?

## 2022-01-29 ENCOUNTER — Other Ambulatory Visit: Payer: Self-pay | Admitting: Surgical

## 2022-01-29 MED ORDER — TRAMADOL HCL 50 MG PO TABS: 50.0000 mg | ORAL_TABLET | Freq: Three times a day (TID) | ORAL | 0 refills | Status: DC | PRN

## 2022-01-29 NOTE — Telephone Encounter (Signed)
Left detailed message on secure VM regarding VO ?

## 2022-01-30 NOTE — Telephone Encounter (Signed)
Signed orders faxed back ?

## 2022-01-30 NOTE — Telephone Encounter (Signed)
Home health orders (OT) received 01/26/22 for Antonio Serrano ?Home health initiation orders: Yes.  ?Home health re-certification orders: Yes. ?Patient last seen by ordering physician for this condition: 01/11/22. Must be less than 90 days for re-certification and less than 30 days prior for initiation. Visit must have been for the condition the orders are being placed.  ?Patient meets criteria for Physician to sign orders: Yes.  ?      Current med list has been attached: No  ?      Orders placed on physicians desk for signature: 01/30/22 (date) ?If patient does not meet criteria for orders to be signed: pt was called to schedule appt. Appt is scheduled for 02/15/22.  ? ?[name] D Shaiden Aldous ?

## 2022-01-30 NOTE — Telephone Encounter (Signed)
Yes ok 

## 2022-02-05 ENCOUNTER — Other Ambulatory Visit: Payer: Self-pay

## 2022-02-05 ENCOUNTER — Encounter: Payer: Self-pay | Admitting: Physical Medicine and Rehabilitation

## 2022-02-05 ENCOUNTER — Emergency Department (HOSPITAL_COMMUNITY)
Admission: EM | Admit: 2022-02-05 | Discharge: 2022-02-05 | Disposition: A | Discharge status: Home / Self Care | Payer: Medicare Other | Attending: Emergency Medicine | Admitting: Emergency Medicine

## 2022-02-05 ENCOUNTER — Emergency Department (HOSPITAL_COMMUNITY): Payer: Medicare Other

## 2022-02-05 ENCOUNTER — Ambulatory Visit: Payer: Medicare Other | Admitting: Physical Medicine and Rehabilitation

## 2022-02-05 ENCOUNTER — Ambulatory Visit: Payer: Self-pay

## 2022-02-05 ENCOUNTER — Encounter (HOSPITAL_COMMUNITY): Payer: Self-pay | Admitting: Emergency Medicine

## 2022-02-05 VITALS — BP 110/61 | HR 63 | Ht 64.0 in | Wt 152.0 lb

## 2022-02-05 DIAGNOSIS — R42 Dizziness and giddiness: Secondary | ICD-10-CM | POA: Insufficient documentation

## 2022-02-05 DIAGNOSIS — R404 Transient alteration of awareness: Secondary | ICD-10-CM

## 2022-02-05 DIAGNOSIS — R0689 Other abnormalities of breathing: Secondary | ICD-10-CM | POA: Insufficient documentation

## 2022-02-05 DIAGNOSIS — Z7984 Long term (current) use of oral hypoglycemic drugs: Secondary | ICD-10-CM | POA: Insufficient documentation

## 2022-02-05 DIAGNOSIS — I129 Hypertensive chronic kidney disease with stage 1 through stage 4 chronic kidney disease, or unspecified chronic kidney disease: Secondary | ICD-10-CM | POA: Insufficient documentation

## 2022-02-05 DIAGNOSIS — F17200 Nicotine dependence, unspecified, uncomplicated: Secondary | ICD-10-CM | POA: Insufficient documentation

## 2022-02-05 DIAGNOSIS — R55 Syncope and collapse: Secondary | ICD-10-CM | POA: Diagnosis not present

## 2022-02-05 DIAGNOSIS — Z79899 Other long term (current) drug therapy: Secondary | ICD-10-CM | POA: Insufficient documentation

## 2022-02-05 DIAGNOSIS — R4020 Unspecified coma: Secondary | ICD-10-CM | POA: Diagnosis not present

## 2022-02-05 DIAGNOSIS — N189 Chronic kidney disease, unspecified: Secondary | ICD-10-CM | POA: Insufficient documentation

## 2022-02-05 DIAGNOSIS — M5416 Radiculopathy, lumbar region: Secondary | ICD-10-CM | POA: Diagnosis not present

## 2022-02-05 DIAGNOSIS — E119 Type 2 diabetes mellitus without complications: Secondary | ICD-10-CM | POA: Diagnosis not present

## 2022-02-05 LAB — BRAIN NATRIURETIC PEPTIDE: B Natriuretic Peptide: 26.6 pg/mL (ref 0.0–100.0)

## 2022-02-05 LAB — BASIC METABOLIC PANEL
Anion gap: 13 (ref 5–15)
BUN: 15 mg/dL (ref 8–23)
CO2: 20 mmol/L — ABNORMAL LOW (ref 22–32)
Calcium: 9 mg/dL (ref 8.9–10.3)
Chloride: 94 mmol/L — ABNORMAL LOW (ref 98–111)
Creatinine, Ser: 1.53 mg/dL — ABNORMAL HIGH (ref 0.61–1.24)
GFR, Estimated: 47 mL/min — ABNORMAL LOW (ref >60)
Glucose, Bld: 143 mg/dL — ABNORMAL HIGH (ref 70–99)
Potassium: 5.3 mmol/L — ABNORMAL HIGH (ref 3.5–5.1)
Sodium: 127 mmol/L — ABNORMAL LOW (ref 135–145)

## 2022-02-05 LAB — CBC
HCT: 40.3 % (ref 39.0–52.0)
Hemoglobin: 13.8 g/dL (ref 13.0–17.0)
MCH: 30.9 pg (ref 26.0–34.0)
MCHC: 34.2 g/dL (ref 30.0–36.0)
MCV: 90.2 fL (ref 80.0–100.0)
Platelets: 236 10*3/uL (ref 150–400)
RBC: 4.47 MIL/uL (ref 4.22–5.81)
RDW: 13.9 % (ref 11.5–15.5)
WBC: 6.8 10*3/uL (ref 4.0–10.5)
nRBC: 0 % (ref 0.0–0.2)

## 2022-02-05 LAB — TROPONIN I (HIGH SENSITIVITY)
Troponin I (High Sensitivity): 4 ng/L (ref <18)
Troponin I (High Sensitivity): 4 ng/L (ref <18)

## 2022-02-05 IMAGING — DX DG CHEST 2V
3 series · 3 of 3 positions shown · non-contrast
Comparison: [DATE]

CLINICAL DATA: Syncope

EXAM:
CHEST - 2 VIEW

[chest lat (1 of 2)]
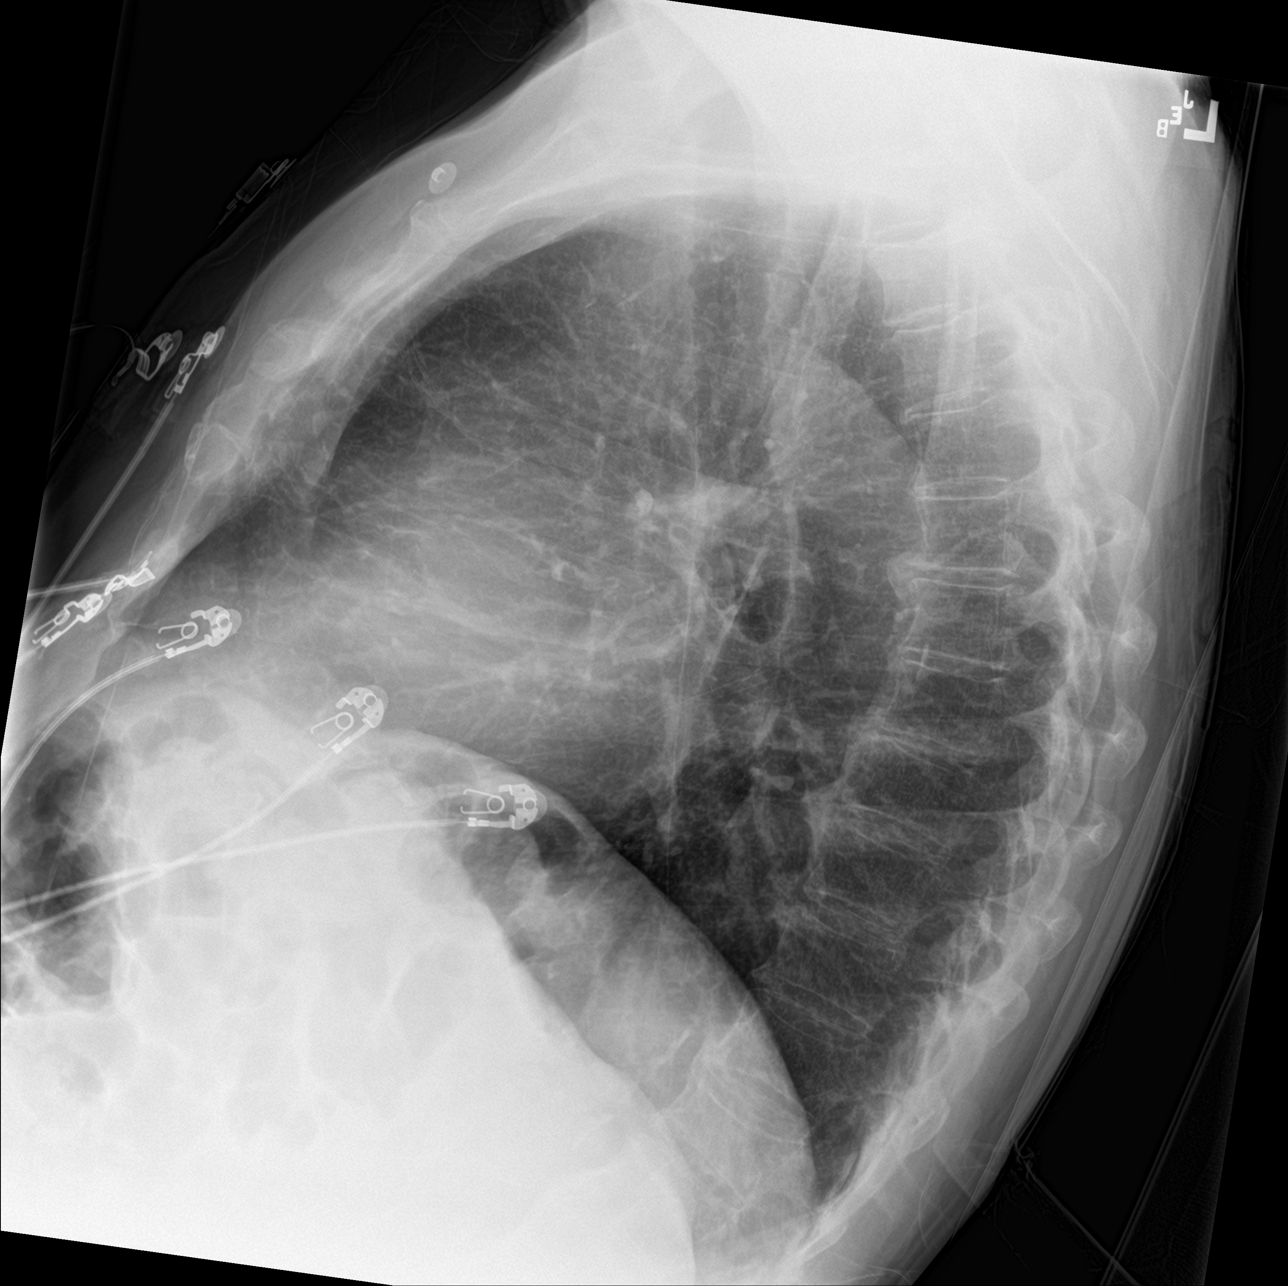

[chest ap]
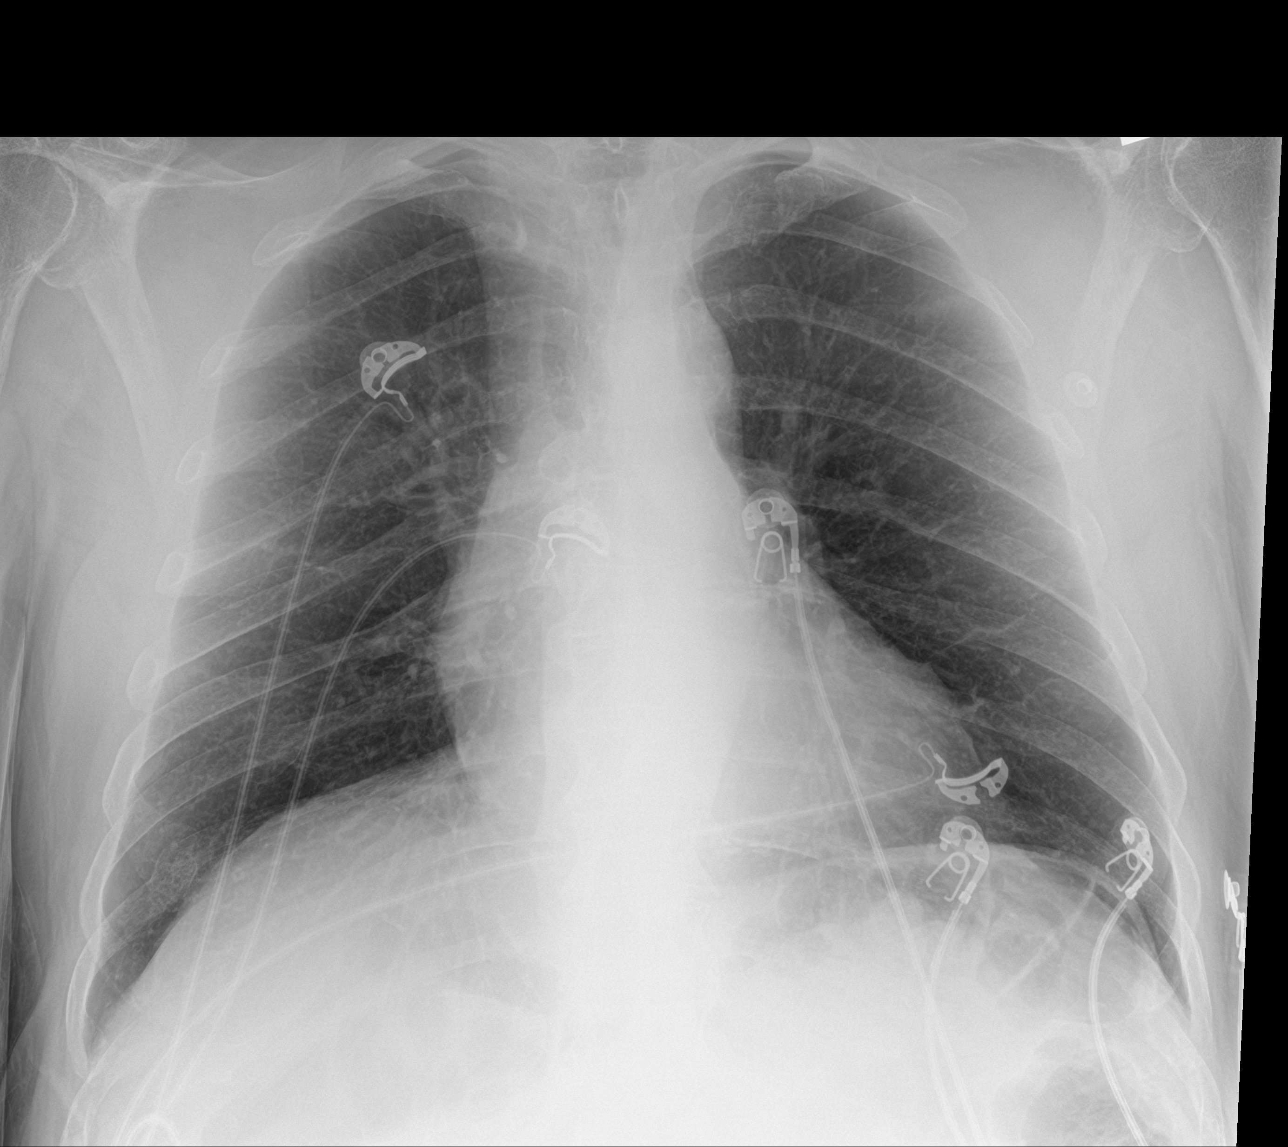

[chest lat (2 of 2)]
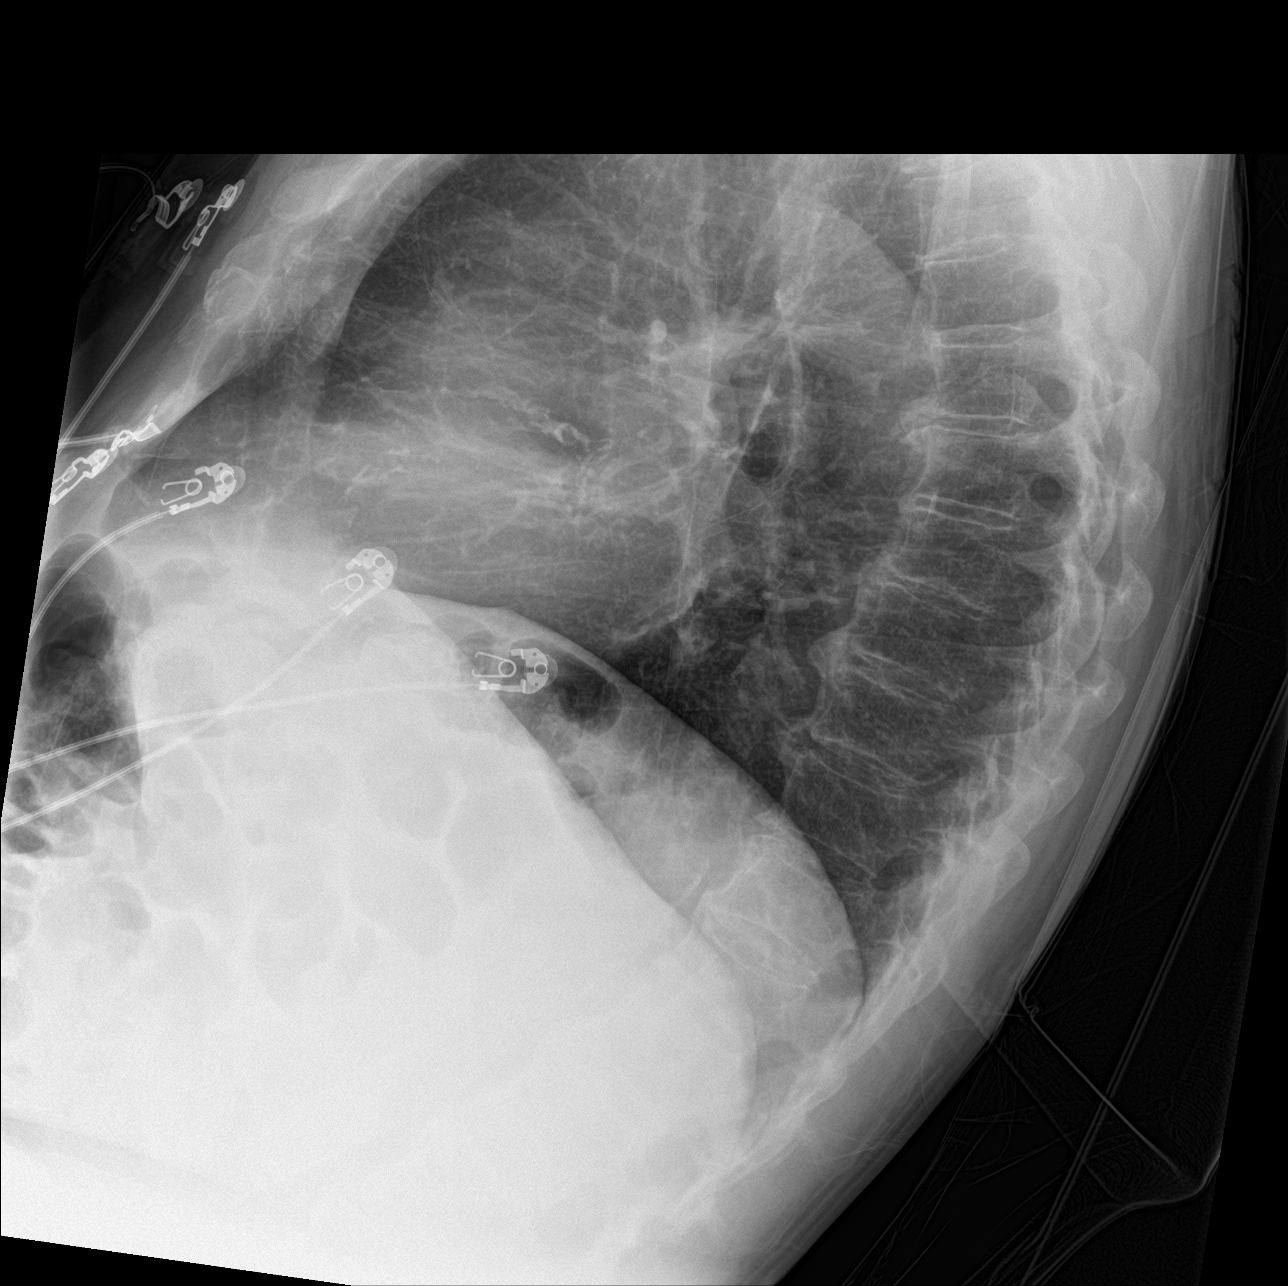

[3 of 3 positions shown; findings below may reference images not displayed]

FINDINGS: Cardiac and mediastinal contours are within normal limits. No focal
pulmonary opacity. No pleural effusion or pneumothorax. No acute
osseous abnormality.
IMPRESSION: No acute cardiopulmonary process.

## 2022-02-05 MED ORDER — SODIUM CHLORIDE 0.9 % IV BOLUS
500.0000 mL | Freq: Once | INTRAVENOUS | Status: AC
  Administered 2022-02-05: 500 mL via INTRAVENOUS

## 2022-02-05 MED ORDER — METHYLPREDNISOLONE ACETATE 80 MG/ML IJ SUSP: 80.0000 mg | Freq: Once | INTRAMUSCULAR | Status: DC

## 2022-02-05 NOTE — ED Triage Notes (Signed)
Pt BIB [facility], pt came in from orthopedic office, had witnessed syncopal episode. Pt had slurred speech then went unconscious. Hx stroke. No neuro deficits. Speech normal on arrival to ED. A/ox4. BP 106 palp with EMS, HR 60, afib. 100% RA, CBG 143. 20g L hand.  ?

## 2022-02-05 NOTE — Progress Notes (Signed)
? ?Antonio Serrano - 76 y.o. male MRN [id number]  Date of birth: [birth year] ? ?Office Visit Note: ?Visit Date: 02/05/2022 ?PCP: Tammi Sou, MD ?Referred by: Tammi Sou, MD ? ?Subjective: ?Chief Complaint  ?Patient presents with  ? Lower Back - Follow-up  ? ?HPI: Antonio Serrano is a 76 y.o. male who comes in todayAt the request of Dr. Anderson Malta for planned right L4 transforaminal epidural steroid injection.  Patient was accompanied by his daughter.  During the initial interview with the patient concerning his pain level and spine anatomy he was vocal and interactive and then fairly abruptly but slowly started to not pay attention and become less verbal really with a lot of motor slowing of his arms.  He reached up to touch his head and it was a very slow response.  At that point I did try to get his attention and he was really not responsive to me he was slumped in the chair at that point.  He did not have any focal deficit but was really not responsive to me with loss of consciousness.  His daughter also try to get his attention and was yelling and shaking him and he was not responsive.  At that point brief check of his pulse showed he did have a pulse and he was breathing.  He was starting to drool.  I did grab my nurse practitioner Barnet Pall, FNP and she came in the room and started providing care.  We initiated a 911 call there are radiology tech.  I also ran and grabbed the AED.  By the time I reentered the room with the AED the patient was responsive and was starting to talk more but with more slurred speech.  Over time he was able to talk somewhat better and was adamant that he just needed some water that this usually goes away.  He was in the hospital recently for TIA work-up and stroke work-up.  He is diabetic.  He only had a piece of cake and coffee earlier this morning and none prior to coming today to the visit.  His pulse started to become more stronger.  He was pale.  He was not  diaphoretic and he was in no increased pain.  We did give him some apple juice to drink.  At that point with talking with the daughter and the patient's wife on the phone my nurse practitioner did decide he really should go with EMS to be worked up for possible TIA or ongoing TIAs.  His blood sugar was normal when tested but he had had the apple juice.  Brief neuro exam prior to EMS entering the office show that the patient did not have a pronator drift he had no facial droop he had good extraocular movement of the eyes.  He did not remember anything that happened when he lost consciousness.  My nurse practitioner did start an IV.  Blood pressure was recorded in the chart initially it was low then he responded to the orange juice once he was responsive and is blood pressure actually was up to normal and then a third check prior to EMS entering it had dropped again.  Patient has a history of stage III chronic renal insufficiency type 2 diabetes with again kidney disease and hypertension.  Recent work-up for TIA. ? ? ? ? ?ROS Otherwise per HPI. ? ?Assessment & Plan: ?Visit Diagnoses:  ?  ICD-10-CM   ?1. Lumbar radiculopathy  M54.16 Epidural Steroid injection  ?  methylPREDNISolone acetate (DEPO-MEDROL) injection 80 mg  ?  CANCELED: XR C-ARM NO REPORT  ?  ?2. Syncope, unspecified syncope type  R55   ?  ?3. Loss of consciousness ([place])  R40.20   ?  ?4. Altered awareness, transient  R40.4   ?  ?   ?Plan: Findings:  ?Patient was transported by EMS to the [facility] for further evaluation work-up of possible TIA versus loss conscious from low blood sugar.  In terms of his hip and leg pain I would be happy to see the patient in the future concerning his potential for epidural injection.  ? ?Meds & Orders:  ?Meds ordered this encounter  ?Medications  ? methylPREDNISolone acetate (DEPO-MEDROL) injection 80 mg  ?  ?Orders Placed This Encounter  ?Procedures  ? Epidural Steroid injection  ?  ?Follow-up: No follow-ups on  file.  ? ?Procedures: ?No procedures performed  ?   ? ?Clinical History: ?MRI LUMBAR SPINE WITHOUT CONTRAST ?  ?TECHNIQUE: ?Multiplanar, multisequence MR imaging of the lumbar spine was ?performed. No intravenous contrast was administered. ?  ?COMPARISON:  Radiographs of the lumbar spine 11/08/2021. ?  ?FINDINGS: ?Segmentation: 5 lumbar vertebrae. The caudal most well-formed ?intervertebral disc space is designated L5-S1. ?  ?Alignment: Trace grade 1 retrolisthesis at L1-L2. 3 mm L4-L5 grade 1 ?anterolisthesis. Trace trace L5-S1 grade 1 anterolisthesis. ?  ?Vertebrae: Redemonstrated mild chronic T12 anterior wedge vertebral ?compression fracture. Vertebral body height is otherwise maintained. ?Mild multilevel degenerative endplate irregularity with small ?Schmorl nodes. Mild degenerative endplate edema at Y40-H47, T12-L1 ?and L4-L5. Multilevel ventrolateral osteophytes. Chronic bilateral ?L4 pars interarticularis defects. ?  ?Conus medullaris and cauda equina: Conus extends to the L1-L2 level. ?No signal abnormality within the visualized distal spinal cord. ?  ?Paraspinal and other soft tissues: Redemonstrated atrophic right ?kidney. Multiple incompletely imaged bilateral renal cysts. Atrophy ?of the lumbar paraspinal musculature. ?  ?Disc levels: ?  ?Multilevel disc degeneration. Most notably, disc degeneration is ?moderate/advanced at L4-L5. ?  ?T10-T11: This level is imaged in the sagittal plane only. Slight ?disc bulge. No significant spinal canal or foraminal stenosis. ?  ?T11-T12: No significant disc herniation or stenosis. ?  ?T12-L1: Mild facet arthrosis. No significant disc herniation or ?stenosis. ?  ?L1-L2: Trace grade 1 retrolisthesis. Very shallow broad-based right ?center/subarticular disc protrusion with associated endplate ?spurring. Minimal relative right subarticular narrowing (without ?nerve root impingement). No significant central canal stenosis or ?neural foraminal narrowing. ?  ?L2-L3: Mild  facet arthrosis. No significant disc herniation or ?stenosis. ?  ?L3-L4: Slight disc bulge. Mild facet arthrosis and ligamentum flavum ?hypertrophy. No significant spinal canal or foraminal stenosis. ?  ?L4-L5: Chronic bilateral L4 pars interarticularis defects. 3 mm ?grade 1 anterolisthesis. Disc uncovering with disc bulge and ?endplate spurring. Mild facet arthrosis. No significant spinal canal ?stenosis. Bilateral neural foraminal narrowing (moderate/severe ?right, mild to moderate left). ?  ?L5-S1: Trace 2 mm grade 1 anterolisthesis. Small broad-based central ?disc protrusion. Mild facet arthrosis. The disc protrusion results ?in mild bilateral subarticular narrowing with possible subtle ?contact upon the bilateral descending S1 nerve roots (series 12, ?image 40). No significant foraminal stenosis. ?  ?IMPRESSION: ?Lumbar and lower thoracic spondylosis, as outlined and with findings ?most notably as follows. ?  ?At L4-L5, there is moderate/advanced disc degeneration. Chronic ?bilateral L4 pars interarticularis defects with 3 mm grade 1 ?anterolisthesis. Disc uncovering with disc bulge and endplate ?spurring. Mild facet arthrosis. Bilateral neural foraminal narrowing ?(moderate/severe right, mild-to-moderate left). Correlate for right ?L4 radiculopathy. No significant  spinal canal stenosis. ?  ?At L5-S1, there is trace grade 1 anterolisthesis. Small broad-based ?central disc protrusion. Mild facet arthrosis. The disc protrusion ?results in mild bilateral subarticular narrowing, and may subtly ?contact the bilateral descending S1 nerve roots. No significant ?central canal stenosis or neural foraminal narrowing. ?  ?Redemonstrated mild chronic T12 vertebral compression fracture. ?  ?Mild degenerative endplate edema at Z80-I21, T12-L1 and L4-L5. ?  ?  ?Electronically Signed ?  By: Kellie Simmering D.O. ?  On: 12/28/2021 13:01  ? ?He reports that he has been smoking cigarettes. He has a 12.50 pack-year smoking history.  He has never used smokeless tobacco.  ?Recent Labs  ?  07/13/21 ?1526 01/04/22 ?1528 01/05/22 ?0359  ?HGBA1C 6.3 6.5* 6.5*  ? ? ?Objective:  VS:  HT:'5\' 4"'$  (162.6 cm)   WT:152 lb (68.9 kg)  BMI:26.08    BP:110

## 2022-02-05 NOTE — ED Provider Notes (Signed)
Care handoff from [facility], PA-C at shift change. Please see their note for further information. ? ?Briefly: Patient presents today for syncopal episode at his orthopedic office earlier today.  Reportedly had interval of dizziness and slurred speech followed by brief interval where the patient appeared to be asleep.  He was subsequently aroused and continued talking immediately following the event.  ? ?Ddx: PE, ACS, TIA ? ?Plan: Labs pending, CXR and EKG without acute findings. If no acute laboratory findings, suspect patient will be stable for discharge.  ? ?Troponins negative, no leukocytosis or anemia.  BNP normal. Na 127, consistent with baseline. K 5.3 but is hemolyzed. Creatinine 1.53 consistent with baseline.  No other acute laboratory findings.  Patient given 500 cc bolus of fluids for possible dehydration as he states he has only had a cup of coffee during today.  Upon reevaluation, patient states that he is feeling at his baseline and has been since his syncopal episode.  He denies any headaches, fevers, chills, dizziness, lightheadedness, chest pain, shortness of breath.  Given that he had a TIA work-up 1 month ago with negative MRI findings and is currently alert and oriented and neurologically intact, do not feel that the scans need to be repeated at this time.  He is afebrile, nontoxic-appearing, and in no acute distress with reassuring vital signs.  No further emergent concerns at this time.  He is stable for discharge, educated on red flag symptoms of prompt immediate return.  Discharged in stable condition. ?  ?Nestor Lewandowsky ?02/05/22 1959 ? ?  ?Dorie Rank, MD ?02/08/22 1313 ? ?

## 2022-02-05 NOTE — Discharge Instructions (Addendum)
Your work-up today was quite reassuring.  I recommend that you schedule an appointment with your primary care doctor in the next few days for further evaluation and management of your symptoms. ? ?Return if development of any new or worsening symptoms. ?

## 2022-02-05 NOTE — ED Provider Notes (Signed)
?[facility] ?Provider Note ? ? ?CSN: [id number] ?Arrival date & time: 02/05/22  1408 ? ?  ? ?History ? ?Chief Complaint  ?Patient presents with  ? Loss of Consciousness  ? ? ?Antonio Serrano is a 76 y.o. male. ? ? ?Loss of Consciousness ? ?Patient is a 76 year old male with past medical history significant for chronic low back pain, HLD, CKD, tobacco use, HTN, DM 2 ?Recent admission to the hospital for concern for stroke 1 month ago was found abnormal MRI ? ?Patient presents emergency room brought in by EMS after he had a syncopal episode at orthopedic office.  He was there for a hip injection with steroids however prior to the injection he was sitting in the chair and states that he did not pass out however bystanders informed him that he was briefly seem to be asleep.  He denies any symptoms at all apart from feeling lightheaded prior to the episode.  Denies any nausea vomiting headache chest pain difficulty breathing no shortness of breath no hemoptysis or leg swelling. ? ?He was in the hospital 1 month ago for questionable TIA was discharged after a 24-hour admission. ? ?No history of VTE ? ?He did not fall out of a chair.  He slumped forward per his recollection. ? ?According to EMS patient's blood pressure was 106 on their arrival and heart rate was 60 ?  ? ?Home Medications ?Prior to Admission medications   ?Medication Sig Start Date End Date Taking? Authorizing Provider  ?acetaminophen (TYLENOL) 500 MG tablet Take 1 tablet (500 mg total) by mouth every 6 (six) hours as needed for mild pain or fever (pain score 1-3 or temp > 100.5). 04/25/21   Barb Merino, MD  ?atorvastatin (LIPITOR) 10 MG tablet Take 1 tablet (10 mg total) by mouth daily. ?Patient not taking: Reported on 01/11/2022 01/05/22   Corky Sox, MD  ?bismuth subsalicylate (PEPTO BISMOL) 262 MG/15ML suspension Take 30 mLs by mouth every 6 (six) hours as needed. Entered as standard dose    [provider]  ?carvedilol (COREG) 6.25 MG tablet Take 1 tablet (6.25 mg total) by mouth 2 (two) times daily with a meal. 09/13/21   [name], Adrian Blackwater, MD  ?clopidogrel (PLAVIX) 75 MG tablet Take 1 tablet (75 mg total) by mouth daily. 01/11/22 01/11/23  [name], Adrian Blackwater, MD  ?Docusate Calcium (STOOL SOFTENER PO) Take 1 tablet by mouth daily as needed (constipation).    [provider]  ?furosemide (LASIX) 40 MG tablet 1 tab po qd as needed for legs swelling 01/11/22   [name], Adrian Blackwater, MD  ?gabapentin (NEURONTIN) 100 MG capsule Take 1 capsule (100 mg total) by mouth 2 (two) times daily. 01/01/22   Meredith Pel, MD  ?ipratropium (ATROVENT) 0.03 % nasal spray USE 2 SPRAY(S) IN EACH NOSTRIL EVERY 12 HOURS 09/13/21   [name], Adrian Blackwater, MD  ?olmesartan (BENICAR) 20 MG tablet Take 1/2 (one-half) tablet by mouth once daily 05/24/21   [name], Adrian Blackwater, MD  ?OVER THE COUNTER MEDICATION Take 2 tablets by mouth daily. Elderberry, zinc, and vitamin C combo medication OTC.    [provider]  ?pantoprazole (PROTONIX) 40 MG tablet Take 1 tablet (40 mg total) by mouth daily. 01/05/22 01/05/23  Pokhrel, Corrie Mckusick, MD  ?pioglitazone (ACTOS) 15 MG tablet 1 TABLET BY MOUTH ONCE DAILY 01/11/22   [name], Adrian Blackwater, MD  ?traMADol (ULTRAM) 50 MG tablet Take 1 tablet (50 mg total) by mouth every 8 (eight) hours as  needed. 01/29/22   Magnant, Gerrianne Scale, PA-C  ?TRAZODONE HCL PO Take 1 tablet by mouth See admin instructions. Every 6-8 hours    [provider]  ?   ? ?Allergies    ?Bactrim [sulfamethoxazole-trimethoprim]   ? ?Review of Systems   ?Review of Systems  ?Cardiovascular:  Positive for syncope.  ? ?Physical Exam ?Updated Vital Signs ?BP 119/62   Pulse (!) 59   Temp 97.8 ?F (36.6 ?C)   Resp 13   SpO2 98%  ?Physical Exam ?Vitals and nursing note reviewed.  ?Constitutional:   ?   General: He is not in acute distress. ?   Comments: Pleasant well-appearing 76 year old man in no distress.  ?He denies any discomfort.  Appears  comfortable.   ?HENT:  ?   Head: Normocephalic and atraumatic.  ?   Nose: Nose normal.  ?   Mouth/Throat:  ?   Mouth: Mucous membranes are moist.  ?Eyes:  ?   General: No scleral icterus. ?Cardiovascular:  ?   Rate and Rhythm: Normal rate and regular rhythm.  ?   Pulses: Normal pulses.  ?   Heart sounds: Normal heart sounds.  ?Pulmonary:  ?   Effort: Pulmonary effort is normal. No respiratory distress.  ?   Breath sounds: No wheezing.  ?Abdominal:  ?   Palpations: Abdomen is soft.  ?   Tenderness: There is no abdominal tenderness. There is no guarding or rebound.  ?Musculoskeletal:  ?   Cervical back: Normal range of motion.  ?   Right lower leg: No edema.  ?   Left lower leg: No edema.  ?Skin: ?   General: Skin is warm and dry.  ?   Capillary Refill: Capillary refill takes less than 2 seconds.  ?Neurological:  ?   Mental Status: He is alert. Mental status is at baseline.  ?   Comments: Alert and oriented to self, place, time and event.  ? ?Speech is fluent, clear without dysarthria or dysphasia.  ? ?Strength 5/5 in upper/lower extremities   ?Sensation intact in upper/lower extremities  ? ?Normal gait.  ?Negative Romberg. No pronator drift.  ?Normal finger-to-nose and feet tapping.  ?CN I not tested  ?CN II grossly intact visual fields bilaterally. Did not visualize posterior eye.  ?CN III, IV, VI PERRLA and EOMs intact bilaterally  ?CN V Intact sensation to sharp and light touch to the face  ?CN VII facial movements symmetric  ?CN VIII not tested  ?CN IX, X no uvula deviation, symmetric rise of soft palate  ?CN XI 5/5 SCM and trapezius strength bilaterally  ?CN XII Midline tongue protrusion, symmetric L/R movements   ?Psychiatric:     ?   Mood and Affect: Mood normal.     ?   Behavior: Behavior normal.  ? ? ?ED Results / Procedures / Treatments   ?Labs ?(all labs ordered are listed, but only abnormal results are displayed) ?Labs Reviewed  ?CBC  ?BRAIN NATRIURETIC PEPTIDE  ?BASIC METABOLIC PANEL  ?TROPONIN I (HIGH  SENSITIVITY)  ? ? ?EKG ?EKG Interpretation ? ?Date/Time:  Monday Feb 05 2022 14:21:09 EDT ?Ventricular Rate:  60 ?PR Interval:  254 ?QRS Duration: 109 ?QT Interval:  432 ?QTC Calculation: 432 ?R Axis:   24 ?Text Interpretation: Sinus or ectopic atrial rhythm Prolonged PR interval Low voltage, precordial leads Confirmed by Wynona Dove ([id number]) on 02/05/2022 3:16:30 PM ? ?Radiology ?DG Chest 2 View ? ?Result Date: 02/05/2022 ?CLINICAL DATA:  Syncope EXAM: CHEST - 2  VIEW COMPARISON:  04/23/2021 FINDINGS: Cardiac and mediastinal contours are within normal limits. No focal pulmonary opacity. No pleural effusion or pneumothorax. No acute osseous abnormality. IMPRESSION: No acute cardiopulmonary process. Electronically Signed   By: Merilyn Baba M.D.   On: 02/05/2022 15:16   ? ?Procedures ?Procedures  ? ? ?Medications Ordered in ED ?Medications - No data to display ? ?ED Course/ Medical Decision Making/ A&P ?Clinical Course as of 02/05/22 1532  ?Mon Feb 05, 2022  ?1422 I personally reviewed MRI from prior visit 01/04/22 ? ?IMPRESSION: ?1. No acute intracranial abnormality. ?2. Moderate chronic small vessel ischemic disease with chronic ?lacunar infarcts as above. ?3. Cerebral atrophy (ICD10-G31.9). [WF]  ?  ?Clinical Course User Index ?[WF] Tedd Sias, [place]  ? ?                        ?Medical Decision Making ?Amount and/or Complexity of Data Reviewed ?Labs: ordered. ?Radiology: ordered. ? ? ? ?This patient presents to the ED for concern of syncope, this involves a number of treatment options, and is a complaint that carries with it a moderate-high risk of complications and morbidity.  The differential diagnosis includes The differential diagnosis of weakness includes but is not limited to neurologic causes (GBS, myasthenia gravis, CVA, MS, ALS, transverse myelitis, spinal cord injury, CVA, botulism, ) and other causes: ACS, Arrhythmia, syncope, orthostatic hypotension, sepsis, hypoglycemia, electrolyte disturbance,  hypothyroidism, respiratory failure, symptomatic anemia, dehydration, heat injury, polypharmacy, malignancy. ? ?For syncope: I considered the following differential diagnosis. ? ?PE, ACS, dissection,  ? ? ?Co morbidi

## 2022-02-08 ENCOUNTER — Telehealth: Payer: Self-pay | Admitting: Family Medicine

## 2022-02-08 DIAGNOSIS — N183 Chronic kidney disease, stage 3 unspecified: Secondary | ICD-10-CM | POA: Diagnosis not present

## 2022-02-08 DIAGNOSIS — Z96641 Presence of right artificial hip joint: Secondary | ICD-10-CM | POA: Diagnosis not present

## 2022-02-08 DIAGNOSIS — Z87440 Personal history of urinary (tract) infections: Secondary | ICD-10-CM | POA: Diagnosis not present

## 2022-02-08 DIAGNOSIS — M47816 Spondylosis without myelopathy or radiculopathy, lumbar region: Secondary | ICD-10-CM | POA: Diagnosis not present

## 2022-02-08 DIAGNOSIS — E785 Hyperlipidemia, unspecified: Secondary | ICD-10-CM | POA: Diagnosis not present

## 2022-02-08 DIAGNOSIS — M5136 Other intervertebral disc degeneration, lumbar region: Secondary | ICD-10-CM | POA: Diagnosis not present

## 2022-02-08 DIAGNOSIS — Z9181 History of falling: Secondary | ICD-10-CM | POA: Diagnosis not present

## 2022-02-08 DIAGNOSIS — I129 Hypertensive chronic kidney disease with stage 1 through stage 4 chronic kidney disease, or unspecified chronic kidney disease: Secondary | ICD-10-CM | POA: Diagnosis not present

## 2022-02-08 DIAGNOSIS — G8929 Other chronic pain: Secondary | ICD-10-CM | POA: Diagnosis not present

## 2022-02-08 DIAGNOSIS — F17210 Nicotine dependence, cigarettes, uncomplicated: Secondary | ICD-10-CM | POA: Diagnosis not present

## 2022-02-08 DIAGNOSIS — E871 Hypo-osmolality and hyponatremia: Secondary | ICD-10-CM | POA: Diagnosis not present

## 2022-02-08 DIAGNOSIS — I2510 Atherosclerotic heart disease of native coronary artery without angina pectoris: Secondary | ICD-10-CM | POA: Diagnosis not present

## 2022-02-08 DIAGNOSIS — E1122 Type 2 diabetes mellitus with diabetic chronic kidney disease: Secondary | ICD-10-CM | POA: Diagnosis not present

## 2022-02-08 DIAGNOSIS — Z8673 Personal history of transient ischemic attack (TIA), and cerebral infarction without residual deficits: Secondary | ICD-10-CM | POA: Diagnosis not present

## 2022-02-08 DIAGNOSIS — Z7984 Long term (current) use of oral hypoglycemic drugs: Secondary | ICD-10-CM | POA: Diagnosis not present

## 2022-02-08 DIAGNOSIS — H814 Vertigo of central origin: Secondary | ICD-10-CM | POA: Diagnosis not present

## 2022-02-08 NOTE — Telephone Encounter (Signed)
LM for [facility]  ?

## 2022-02-08 NOTE — Addendum Note (Signed)
Addended by: Octaviano Glow on: 02/08/2022 04:29 PM ? ? Modules accepted: Orders ? ?

## 2022-02-08 NOTE — Telephone Encounter (Signed)
VO: 1 a week for 4 weeks for PT ? ? ?Okay to take half carvedilol? ? ?confirm d/c of gabapentin, methocarbamol and olmesartan, states pt is not taking ? ? ?Okay to LVM ?[phone] Tillie Rung ?

## 2022-02-08 NOTE — Telephone Encounter (Signed)
Rx discontinued

## 2022-02-08 NOTE — Telephone Encounter (Signed)
Yes ok to take 1/2 of the 6.'25mg'$  carvedilol bid. ?Ok to stay off gabapentin, methocarbamol, and olmesartan. ?Pls d/c these meds from his active med list-thx ?

## 2022-02-08 NOTE — Telephone Encounter (Signed)
[facility] is requesting a verbal order. They would like to have his PT be 1xw due to him going to the ED and a change in his med could be causing him to be off balance. ?Please call so she can discuss the med change. ?[phone] ?

## 2022-02-08 NOTE — Telephone Encounter (Signed)
LM for approved order ? ?

## 2022-02-14 ENCOUNTER — Ambulatory Visit: Payer: Medicare Other | Admitting: Family Medicine

## 2022-02-15 ENCOUNTER — Ambulatory Visit (INDEPENDENT_AMBULATORY_CARE_PROVIDER_SITE_OTHER): Payer: Medicare Other | Admitting: Family Medicine

## 2022-02-15 ENCOUNTER — Encounter: Payer: Self-pay | Admitting: Family Medicine

## 2022-02-15 VITALS — BP 118/74 | HR 88 | Temp 97.6°F | Ht 64.0 in | Wt 151.6 lb

## 2022-02-15 DIAGNOSIS — E871 Hypo-osmolality and hyponatremia: Secondary | ICD-10-CM

## 2022-02-15 DIAGNOSIS — R001 Bradycardia, unspecified: Secondary | ICD-10-CM

## 2022-02-15 DIAGNOSIS — R55 Syncope and collapse: Secondary | ICD-10-CM

## 2022-02-15 DIAGNOSIS — N183 Chronic kidney disease, stage 3 unspecified: Secondary | ICD-10-CM | POA: Diagnosis not present

## 2022-02-15 DIAGNOSIS — I952 Hypotension due to drugs: Secondary | ICD-10-CM

## 2022-02-15 DIAGNOSIS — E878 Other disorders of electrolyte and fluid balance, not elsewhere classified: Secondary | ICD-10-CM | POA: Diagnosis not present

## 2022-02-15 NOTE — Patient Instructions (Addendum)
Stop completely carvedilol  Call office at [phone] if experience any leg swelling

## 2022-02-15 NOTE — Progress Notes (Signed)
OFFICE VISIT  02/15/2022  CC: Follow-up blood pressure problems  Patient is a 76 y.o. male who presents for 1 mo f/u disequilibrium syndrome and blood pressure issues. A/P as of last visit: "#1 dizziness with fall and slurred speech. TIA possible/suspected.  Cerebrovascular disease documented on imaging.   Doing well post-hospitalization. Plan is to take aspirin 81 mg and Plavix 75 mg daily for 3 weeks and then switch over to 75 mg Plavix daily by itself. Plavix prescription sent in today. He is cutting back on cigarettes but knows he needs to completely quit.  He declines nicotine replacement therapy. Continue atorvastatin 10 mg a day (LDL was 70 in hosp).   #2 disequilibrium syndrome.  He does have a little bit of orthostatic hypotension. Stand slowly, weight briefly before walking. Meclizine did not help him so we will discontinue this. His hydration status sounds pretty good but would like him to stop Lasix.  He can use this as needed lower extremity swelling.  He currently has no swelling at all.   #3 hypertension.  Well-controlled on carvedilol 6.25 twice daily and olmisartan 20 mg a day. Electrolytes normal, creatinine at baseline upon discharge.   #4 chronic renal insufficiency stage IIIa. Serum creatinine was at baseline upon discharge home.   #5 type 2 diabetes. Hemoglobin A1c in the hospital was 6.5% on 01/05/2022. Continue pioglitazone 15 mg a day.   #6 BPH with lower urinary tract obstructive symptoms.  He does have some overactive bladder symptoms as well. This is stable but hopefully getting off daily Lasix will help alleviate some of this.  He does have history of UTI and says he knows very well what it feels like when he starting to have 1 and he will call if that occurs."  INTERIM HX: Generally feels well. Still with intermittent dizziness that seems random most of the time but has in the past been orthostatic to at least some degree. He continues to take Lasix  most days despite not having any swelling.  He is concerned that he does not make enough urine so he takes this med to urinate more. However, he has been having some problems with low blood pressure and bradycardia lately.  He has cut back to taking one quarter of his Coreg 6.125 mg tab.  Blood pressures have not climbed above 120/80, heart rate into the 80s max. Denies chest pain, shortness of breath, focal weakness, or palpitations.  He had an episode of syncope while at his orthopedist on 02/05/2022.  It is not clear whether blood pressure or blood sugar was playing a role. He was transported to the ED where he was given 500 cc IV fluid and monitored for a while and released. I reviewed his labs from that ED visit today: His sodium was 127, potassium 5.3, and creatinine 1.53.  Opponent is negative. CBC was normal.  Chest x-ray was normal.  EKG showed sinus rhythm, rate 60.  Past Medical History:  Diagnosis Date   Atherosclerosis of coronary artery    On chest CT->calcif in LAD and RCA   Chronic low back pain    MR 11/2021 showed L4-5 foraminal stenosis--> plan per Ortho was to do L4-5 injection   Chronic prostatitis    Very mild elevation of PSA (>4), referred to Urol where PSA repeat was 1.0 on 06/16/13.  Annual PSA repeat is all that is needed now per urologist.  Most recent 07/2015 was 0.45.   Chronic renal insufficiency, stage III (moderate) (HCC)  Baseline GFR as of 2019= 50s.     Closed right hip fracture ([place]) 03/2021   THA   Diabetes mellitus with complication ([place]) Dx'd fall 2011   HTN (hypertension)    Renal/aortic doppler u/s normal 06/2010   Hypercalcemia    Hyperlipidemia 2016   Statin intolerant--myalgias.  Pt refuses any further trial of statin as of 2018.   Nonspecific abnormal electrocardiogram (ECG) (EKG) Fall 2011   TWI in inferior leads: myocardial perfusion scan neg and echo normal 07/2010   Pseudomonas aeruginosa colonization    12/2021 urine clx   TIA (transient  ischemic attack)    12/2021- CT angio head/neck no high grade stenosis.  MR showed old lacunar infarcts corona radiata, basal ganglia, thalamus.  Echo normal.   Tobacco dependence    UTI (lower urinary tract infection)    Feb 2012 (klebsiella--dx at [facility]); 03/2013 e coli.     Past Surgical History:  Procedure Laterality Date   CARDIOVASCULAR STRESS TEST  07/01/2010   Myocardial perfusion scan neg/low risk.   CATARACT EXTRACTION  2007, 2008   Bilateral ([facility] on [address].   COLONOSCOPY  12/20/04;12/2014   2016 no polyps: recall 5 yrs due to [place] of colon ca   ESOPHAGOGASTRODUODENOSCOPY  11/29/2004   esoph stricture/dilation   TOTAL HIP ARTHROPLASTY Right 04/23/2021   Procedure: TOTAL HIP ARTHROPLASTY ANTERIOR APPROACH;  Surgeon: Meredith Pel, MD;  Location: [facility];  Service: Orthopedics;  Laterality: Right;  Hana, C-arm, Depuy   TRANSTHORACIC ECHOCARDIOGRAM  07/01/2010   2011 EF =>55%; LA mildly dilated; trace MR/TR.  12/2021 EF 60-65%, DD indeterm, valves nl.    Outpatient Medications Prior to Visit  Medication Sig Dispense Refill   acetaminophen (TYLENOL) 500 MG tablet Take 1 tablet (500 mg total) by mouth every 6 (six) hours as needed for mild pain or fever (pain score 1-3 or temp > 100.5).     clopidogrel (PLAVIX) 75 MG tablet Take 1 tablet (75 mg total) by mouth daily. 90 tablet 3   Docusate Calcium (STOOL SOFTENER PO) Take 1 tablet by mouth daily as needed (constipation).     ipratropium (ATROVENT) 0.03 % nasal spray USE 2 SPRAY(S) IN EACH NOSTRIL EVERY 12 HOURS 30 mL 11   OVER THE COUNTER MEDICATION Take 2 tablets by mouth daily. Elderberry, zinc, and vitamin C combo medication OTC.     pantoprazole (PROTONIX) 40 MG tablet Take 1 tablet (40 mg total) by mouth daily. 30 tablet 2   pioglitazone (ACTOS) 15 MG tablet 1 TABLET BY MOUTH ONCE DAILY 90 tablet 1   traMADol (ULTRAM) 50 MG tablet Take 1 tablet (50 mg total) by mouth every 8 (eight) hours as  needed. 60 tablet 0   TRAZODONE HCL PO Take 1 tablet by mouth See admin instructions. Every 6-8 hours     carvedilol (COREG) 6.25 MG tablet Take 1 tablet (6.25 mg total) by mouth 2 (two) times daily with a meal. 180 tablet 3   atorvastatin (LIPITOR) 10 MG tablet Take 1 tablet (10 mg total) by mouth daily. (Patient not taking: Reported on 01/11/2022) 30 tablet 0   bismuth subsalicylate (PEPTO BISMOL) 262 MG/15ML suspension Take 30 mLs by mouth every 6 (six) hours as needed. Entered as standard dose (Patient not taking: Reported on 02/15/2022)     furosemide (LASIX) 40 MG tablet 1 tab po qd as needed for legs swelling (Patient not taking: Reported on 02/15/2022) 30 tablet 1   Facility-Administered Medications Prior to Visit  Medication Dose Route Frequency Provider Last Rate Last Admin   methylPREDNISolone acetate (DEPO-MEDROL) injection 80 mg  80 mg Other Once Magnus Sinning, MD        Allergies  Allergen Reactions   Bactrim [Sulfamethoxazole-Trimethoprim] Nausea Only    ROS As per HPI  PE:    02/15/2022    3:18 PM 02/05/2022    8:00 PM 02/05/2022    7:45 PM  Vitals with BMI  Height '5\' 4"'$     Weight 151 lbs 10 oz    BMI 17.49    Systolic 449 675 916  Diastolic 74 75 384  Pulse 88 67 71     Physical Exam  Gen: Alert, well appearing.  Patient is oriented to person, place, time, and situation. AFFECT: pleasant, lucid thought and speech. CV: RRR (rate 75-80 by me), no m/r/g.   LUNGS: CTA bilat, nonlabored resps, good aeration in all lung fields. EXT: no clubbing or cyanosis.  no edema.    LABS:  Last CBC Lab Results  Component Value Date   WBC 6.8 02/05/2022   HGB 13.8 02/05/2022   HCT 40.3 02/05/2022   MCV 90.2 02/05/2022   MCH 30.9 02/05/2022   RDW 13.9 02/05/2022   PLT 236 66/59/9357   Last metabolic panel Lab Results  Component Value Date   GLUCOSE 143 (H) 02/05/2022   NA 127 (L) 02/05/2022   K 5.3 (H) 02/05/2022   CL 94 (L) 02/05/2022   CO2 20 (L) 02/05/2022    BUN 15 02/05/2022   CREATININE 1.53 (H) 02/05/2022   GFRNONAA 47 (L) 02/05/2022   CALCIUM 9.0 02/05/2022   PHOS 3.7 04/24/2021   PROT 6.3 (L) 01/04/2022   ALBUMIN 3.6 01/04/2022   LABGLOB 2.3 02/16/2019   AGRATIO 1.9 02/16/2019   BILITOT 0.7 01/04/2022   ALKPHOS 87 01/04/2022   AST 18 01/04/2022   ALT 13 01/04/2022   ANIONGAP 13 02/05/2022   IMPRESSION AND PLAN:  1) Hypotension and bradycardia while on his beta-blocker.  Blood pressure and heart rate normal here today. Stop Coreg completely. Stop Lasix completely. Monitor for lower extremity edema and call if he notes any.  #2 disequilibrium syndrome, unknown etiology,stable. He has ENT consultation planned.  3.  Syncopal episode.  Suspect vasovagal episode. Work-up showed only low sodium, which I suspect is due to his ongoing Lasix use. Repeat basic metabolic panel today.  #4 chronic renal insufficiency stage III. Serum creatinine 10 days ago in the ED was at his baseline of 1.5. I see no reason for him to be on Lasix.-->  stop this medication. Recheck basic metabolic panel today.  #5 BPH with lower urinary tract obstructive symptoms.  He does have some overactive bladder symptoms as well.  I think patient reacts to his incomplete bladder emptying by taking Lasix. We will discuss medication treatment in the future but at this point I want to see how he does with the changes we made today already. Not at alpha blocker candidate due to his baseline dizziness problems. Can consider beta 3 agonist or 5 alpha reductase inhibitor trial in the future.  An After Visit Summary was printed and given to the patient.  FOLLOW UP: Return in about 4 weeks (around 03/15/2022) for HTN/ CKD.  Signed:  Crissie Sickles, MD           02/15/2022

## 2022-02-16 LAB — BASIC METABOLIC PANEL
BUN: 17 mg/dL (ref 6–23)
CO2: 26 mEq/L (ref 19–32)
Calcium: 9.5 mg/dL (ref 8.4–10.5)
Chloride: 91 mEq/L — ABNORMAL LOW (ref 96–112)
Creatinine, Ser: 1.42 mg/dL (ref 0.40–1.50)
GFR: 48.74 mL/min — ABNORMAL LOW (ref >60.00)
Glucose, Bld: 162 mg/dL — ABNORMAL HIGH (ref 70–99)
Potassium: 3.5 mEq/L (ref 3.5–5.1)
Sodium: 127 mEq/L — ABNORMAL LOW (ref 135–145)

## 2022-02-19 ENCOUNTER — Telehealth: Payer: Self-pay

## 2022-02-19 MED ORDER — MIRABEGRON ER 25 MG PO TB24: 25.0000 mg | ORAL_TABLET | Freq: Every day | ORAL | 1 refills | Status: DC

## 2022-02-19 NOTE — Telephone Encounter (Signed)
-----   Message from Tammi Sou, MD sent at 02/19/2022 12:34 PM EDT ----- Faythe Ghee please do prescription for Myrbetriq 25 mg daily, #30, refill x1

## 2022-02-21 NOTE — Telephone Encounter (Addendum)
Pt does not think Mybetriq is helping. Took quarter of a lasix last night, peed a lot but still only urinating 30oz. He thought this medication would help for BPH.  Please review and advise

## 2022-02-21 NOTE — Telephone Encounter (Signed)
Patient calling in regards to Mybetriq.  Has some questions, patient is not going to bathroom.   Please call [phone]

## 2022-02-21 NOTE — Telephone Encounter (Signed)
Increase myrbetriq to 2 of the 25 mg tabs every day. Encourage him to be patient because it may take a week or two for maximal effect. Follow-up with me in a couple weeks if he has not already made arrangements for this.

## 2022-02-22 NOTE — Telephone Encounter (Signed)
Pt was advised of recommendations.  

## 2022-03-15 ENCOUNTER — Ambulatory Visit (INDEPENDENT_AMBULATORY_CARE_PROVIDER_SITE_OTHER): Payer: Medicare Other | Admitting: Family Medicine

## 2022-03-15 ENCOUNTER — Encounter: Payer: Self-pay | Admitting: Family Medicine

## 2022-03-15 VITALS — BP 95/57 | HR 86 | Temp 97.5°F | Ht 64.0 in | Wt 145.6 lb

## 2022-03-15 DIAGNOSIS — I959 Hypotension, unspecified: Secondary | ICD-10-CM

## 2022-03-15 DIAGNOSIS — N1831 Chronic kidney disease, stage 3a: Secondary | ICD-10-CM

## 2022-03-15 DIAGNOSIS — I10 Essential (primary) hypertension: Secondary | ICD-10-CM | POA: Diagnosis not present

## 2022-03-15 DIAGNOSIS — N390 Urinary tract infection, site not specified: Secondary | ICD-10-CM

## 2022-03-15 DIAGNOSIS — E871 Hypo-osmolality and hyponatremia: Secondary | ICD-10-CM

## 2022-03-15 LAB — POCT URINALYSIS DIPSTICK
Bilirubin, UA: POSITIVE
Blood, UA: NEGATIVE
Glucose, UA: NEGATIVE
Ketones, UA: POSITIVE
Nitrite, UA: NEGATIVE
Protein, UA: POSITIVE — AB
Spec Grav, UA: 1.015 (ref 1.010–1.025)
Urobilinogen, UA: 1 E.U./dL
pH, UA: 5.5 (ref 5.0–8.0)

## 2022-03-15 MED ORDER — TRAZODONE HCL 50 MG PO TABS: ORAL_TABLET | ORAL | 1 refills | Status: DC

## 2022-03-15 MED ORDER — CIPROFLOXACIN HCL 500 MG PO TABS: 500.0000 mg | ORAL_TABLET | Freq: Two times a day (BID) | ORAL | 0 refills | Status: DC

## 2022-03-15 MED ORDER — MIRABEGRON ER 50 MG PO TB24: 50.0000 mg | ORAL_TABLET | Freq: Every day | ORAL | 1 refills | Status: DC

## 2022-03-15 NOTE — Progress Notes (Signed)
OFFICE VISIT  03/15/2022  CC:  Chief Complaint  Patient presents with   Hypertension   Chronic Kidney Disease    Patient is a 76 y.o. male who presents for 2-week follow-up hypertension and chronic kidney disease A/P as of last visit: "1) Hypotension and bradycardia while on his beta-blocker.  Blood pressure and heart rate normal here today. Stop Coreg completely. Stop Lasix completely. Monitor for lower extremity edema and call if he notes any.   #2 disequilibrium syndrome, unknown etiology,stable. He has ENT consultation planned.  3.  Syncopal episode.  Suspect vasovagal episode. Work-up showed only low sodium, which I suspect is due to his ongoing Lasix use. Repeat basic metabolic panel today.   #4 chronic renal insufficiency stage III. Serum creatinine 10 days ago in the ED was at his baseline of 1.5. I see no reason for him to be on Lasix.-->  stop this medication. Recheck basic metabolic panel today.   #5 BPH with lower urinary tract obstructive symptoms.  He does have some overactive bladder symptoms as well.  I think patient reacts to his incomplete bladder emptying by taking Lasix. We will discuss medication treatment in the future but at this point I want to see how he does with the changes we made today already. Not at alpha blocker candidate due to his baseline dizziness problems. Can consider 5 alpha reductase inhibitor trial in the future."  INTERIM HX: Since last visit patient has felt well other than today. He says blood pressures have consistently been in the 161-096 systolic range at home.  Denies dizziness, vertigo, presyncope, or acute fatigue. Today he started having fatigue and intermittent lightheadedness.  He has finished PT for his gait instability/disequilibrium--says all of this is better. He uses a walker at home.  As per my instructions last visit he has not been taking any Lasix and he has stopped his Coreg completely. He currently takes no  antihypertensives.  He denies any trouble urinating, says he typically urinates about 50 ounces per day.  Gets up about 3 times per night to urinate.  Says Myrbetriq 25 mg has helped.  Occasionally he will feel a twinge of burning upon urination but nothing persistent.  His urine has been darker and cloudier today. No legs swelling.  ROS as above, plus--> no fevers, no CP, no SOB, no wheezing, no cough,no HAs, no rashes, no melena/hematochezia.  No polyuria or polydipsia.  No myalgias or arthralgias.  No focal weakness, paresthesias, or tremors.  No acute vision or hearing abnormalities.  No recent changes in lower legs. No n/v/d or abd pain.  No palpitations.     Past Medical History:  Diagnosis Date   Atherosclerosis of coronary artery    On chest CT->calcif in LAD and RCA   Chronic low back pain    MR 11/2021 showed L4-5 foraminal stenosis--> plan per Ortho was to do L4-5 injection   Chronic prostatitis    Very mild elevation of PSA (>4), referred to Urol where PSA repeat was 1.0 on 06/16/13.  Annual PSA repeat is all that is needed now per urologist.  Most recent 07/2015 was 0.45.   Chronic renal insufficiency, stage III (moderate) (HCC)    Baseline GFR as of 2019= 50s.     Closed right hip fracture ([place]) 03/2021   THA   Diabetes mellitus with complication ([place]) Dx'd fall 2011   HTN (hypertension)    Renal/aortic doppler u/s normal 06/2010   Hypercalcemia    Hyperlipidemia 2016  Statin intolerant--myalgias.  Pt refuses any further trial of statin as of 2018.   Nonspecific abnormal electrocardiogram (ECG) (EKG) Fall 2011   TWI in inferior leads: myocardial perfusion scan neg and echo normal 07/2010   Pseudomonas aeruginosa colonization    12/2021 urine clx   TIA (transient ischemic attack)    12/2021- CT angio head/neck no high grade stenosis.  MR showed old lacunar infarcts corona radiata, basal ganglia, thalamus.  Echo normal.   Tobacco dependence    UTI (lower urinary tract  infection)    Feb 2012 (klebsiella--dx at [facility]); 03/2013 e coli.     Past Surgical History:  Procedure Laterality Date   CARDIOVASCULAR STRESS TEST  07/01/2010   Myocardial perfusion scan neg/low risk.   CATARACT EXTRACTION  2007, 2008   Bilateral ([facility] on [address].   COLONOSCOPY  12/20/04;12/2014   2016 no polyps: recall 5 yrs due to [place] of colon ca   ESOPHAGOGASTRODUODENOSCOPY  11/29/2004   esoph stricture/dilation   TOTAL HIP ARTHROPLASTY Right 04/23/2021   Procedure: TOTAL HIP ARTHROPLASTY ANTERIOR APPROACH;  Surgeon: Meredith Pel, MD;  Location: [facility];  Service: Orthopedics;  Laterality: Right;  Hana, C-arm, Depuy   TRANSTHORACIC ECHOCARDIOGRAM  07/01/2010   2011 EF =>55%; LA mildly dilated; trace MR/TR.  12/2021 EF 60-65%, DD indeterm, valves nl.    Outpatient Medications Prior to Visit  Medication Sig Dispense Refill   clopidogrel (PLAVIX) 75 MG tablet Take 1 tablet (75 mg total) by mouth daily. 90 tablet 3   pantoprazole (PROTONIX) 40 MG tablet Take 1 tablet (40 mg total) by mouth daily. 30 tablet 2   pioglitazone (ACTOS) 15 MG tablet 1 TABLET BY MOUTH ONCE DAILY 90 tablet 1   mirabegron ER (MYRBETRIQ) 25 MG TB24 tablet Take 1 tablet (25 mg total) by mouth daily. 30 tablet 1   acetaminophen (TYLENOL) 500 MG tablet Take 1 tablet (500 mg total) by mouth every 6 (six) hours as needed for mild pain or fever (pain score 1-3 or temp > 100.5). (Patient not taking: Reported on 03/15/2022)     atorvastatin (LIPITOR) 10 MG tablet Take 1 tablet (10 mg total) by mouth daily. (Patient not taking: Reported on 01/11/2022) 30 tablet 0   bismuth subsalicylate (PEPTO BISMOL) 262 MG/15ML suspension Take 30 mLs by mouth every 6 (six) hours as needed. Entered as standard dose (Patient not taking: Reported on 02/15/2022)     Docusate Calcium (STOOL SOFTENER PO) Take 1 tablet by mouth daily as needed (constipation). (Patient not taking: Reported on 03/15/2022)      furosemide (LASIX) 40 MG tablet 1 tab po qd as needed for legs swelling (Patient not taking: Reported on 02/15/2022) 30 tablet 1   ipratropium (ATROVENT) 0.03 % nasal spray USE 2 SPRAY(S) IN EACH NOSTRIL EVERY 12 HOURS (Patient not taking: Reported on 03/15/2022) 30 mL 11   OVER THE COUNTER MEDICATION Take 2 tablets by mouth daily. Elderberry, zinc, and vitamin C combo medication OTC. (Patient not taking: Reported on 03/15/2022)     traMADol (ULTRAM) 50 MG tablet Take 1 tablet (50 mg total) by mouth every 8 (eight) hours as needed. (Patient not taking: Reported on 03/15/2022) 60 tablet 0   TRAZODONE HCL PO Take 1 tablet by mouth See admin instructions. Every 6-8 hours (Patient not taking: Reported on 03/15/2022)     Facility-Administered Medications Prior to Visit  Medication Dose Route Frequency Provider Last Rate Last Admin   methylPREDNISolone acetate (DEPO-MEDROL) injection 80 mg  80  mg Other Once Magnus Sinning, MD        Allergies  Allergen Reactions   Bactrim [Sulfamethoxazole-Trimethoprim] Nausea Only    ROS As per HPI  PE:    03/15/2022    1:26 PM 02/15/2022    3:18 PM 02/05/2022    8:00 PM  Vitals with BMI  Height '5\' 4"'$  '5\' 4"'$    Weight 145 lbs 10 oz 151 lbs 10 oz   BMI 67.67 20.94   Systolic 95 709 628  Diastolic 57 74 75  Pulse 86 88 67  02 sat 97% RA today  Physical Exam  Gen: Alert, tired appearing and at the beginning of the visit he was somnolent but interactive/responsive.  No confusion, disorientation, or delirium symptoms. In the latter three quarters of the visit he was perfectly alert and oriented and conversant. Pleasant affect. ZMO:QHUT: no injection, icteris, swelling, or exudate.  EOMI, PERRLA. Mouth: lips without lesion/swelling.  Oral mucosa pink and moist. Oropharynx without erythema, exudate, or swelling.  CV: RRR, distant S1 and S2, no audible murmur or rub. Chest is clear, no wheezing or rales. Normal symmetric air entry throughout both lung fields. No  chest wall deformities or tenderness. ABD: soft, NT/ND EXT: no clubbing or cyanosis.  no edema.   LABS:  Last CBC Lab Results  Component Value Date   WBC 6.8 02/05/2022   HGB 13.8 02/05/2022   HCT 40.3 02/05/2022   MCV 90.2 02/05/2022   MCH 30.9 02/05/2022   RDW 13.9 02/05/2022   PLT 236 65/46/5035   Last metabolic panel Lab Results  Component Value Date   GLUCOSE 162 (H) 02/15/2022   NA 127 (L) 02/15/2022   K 3.5 02/15/2022   CL 91 (L) 02/15/2022   CO2 26 02/15/2022   BUN 17 02/15/2022   CREATININE 1.42 02/15/2022   GFRNONAA 47 (L) 02/05/2022   CALCIUM 9.5 02/15/2022   PHOS 3.7 04/24/2021   PROT 6.3 (L) 01/04/2022   ALBUMIN 3.6 01/04/2022   LABGLOB 2.3 02/16/2019   AGRATIO 1.9 02/16/2019   BILITOT 0.7 01/04/2022   ALKPHOS 87 01/04/2022   AST 18 01/04/2022   ALT 13 01/04/2022   ANIONGAP 13 02/05/2022   Last hemoglobin A1c Lab Results  Component Value Date   HGBA1C 6.5 (H) 01/05/2022   POC CC UA today showed trace blood, 1+ protein, and 1+ leu, otherwise normal. Dark yellow and cloudy.  Twelve-lead EKG today: Sinus rhythm, first-degree heart block, rate 67 ,no ischemia or ectopy.  QRS duration and QT interval normal.  No change compared to EKG 02/05/2022.  IMPRESSION AND PLAN:  #1 hypotension, unknown etiology--apparently acute onset today. Other than some lethargy at the beginning of our visit patient appears remarkably well. I had him stand up in the exam room and he had no dizziness. Discussed possibilities in detail with patient and daughter, including go to the ER or call EMS. We all agreed that at this point since he is feeling well we will hold off from doing this. We will check bmet today.  EKG normal. Sent urine for culture and sensitivity. Signs/symptoms to call or return for were reviewed and pt expressed understanding.  #2 abnormal urinalysis. History of Pseudomonas urine colonization. Given problem #1 above will definitely start Cipro 500 mg  twice daily x7 days. Sent urine specimen for culture and sensitivities.  #3 incontinence.  Suspect mixture of OAB and BPH. Patient improved with Myrbetriq 25 mg.  Will increase to 50 mg a day.  Not a  good candidate for alpha-blocker due to his dizziness/hypotension.  #4 hyponatremia. He was taking Lasix when his low sodium was detected on 02/15/2022.   He stopped Lasix 2 weeks ago. recheck basic metabolic panel today.  An After Visit Summary was printed and given to the patient.  FOLLOW UP: TBD  Signed:  Crissie Sickles, MD           03/15/2022

## 2022-03-17 LAB — URINE CULTURE
MICRO NUMBER:: 13531777
SPECIMEN QUALITY:: ADEQUATE

## 2022-04-10 ENCOUNTER — Telehealth: Payer: Self-pay

## 2022-04-10 NOTE — Telephone Encounter (Signed)
Pt called stating that he has been trying to get off lasix for about a month now. States that when he does not take it, he can not use the bathroom. He will have to urge to urinate but nothing comes out and he does not want that because he doesn't want his kidneys to fail. Please advise

## 2022-04-10 NOTE — Telephone Encounter (Signed)
Pt scheduled for appt 7/19

## 2022-04-10 NOTE — Telephone Encounter (Signed)
I do not want him to take Lasix. Patient needs to set up appointment to discuss. I will go into detail about things at that time but it is not something I can do in a simple telephone call-back  I believe he has been seen in the past by [facility]. Pls ask them for most recent office note. Thanks.

## 2022-04-18 ENCOUNTER — Encounter: Payer: Self-pay | Admitting: Family Medicine

## 2022-04-18 ENCOUNTER — Ambulatory Visit (INDEPENDENT_AMBULATORY_CARE_PROVIDER_SITE_OTHER): Payer: Medicare Other | Admitting: Family Medicine

## 2022-04-18 VITALS — BP 131/74 | HR 67 | Temp 98.6°F | Wt 157.2 lb

## 2022-04-18 DIAGNOSIS — N401 Enlarged prostate with lower urinary tract symptoms: Secondary | ICD-10-CM

## 2022-04-18 DIAGNOSIS — N138 Other obstructive and reflux uropathy: Secondary | ICD-10-CM | POA: Diagnosis not present

## 2022-04-18 DIAGNOSIS — N3281 Overactive bladder: Secondary | ICD-10-CM | POA: Diagnosis not present

## 2022-04-18 DIAGNOSIS — E871 Hypo-osmolality and hyponatremia: Secondary | ICD-10-CM | POA: Diagnosis not present

## 2022-04-18 LAB — BASIC METABOLIC PANEL
BUN: 11 mg/dL (ref 6–23)
CO2: 27 mEq/L (ref 19–32)
Calcium: 9.3 mg/dL (ref 8.4–10.5)
Chloride: 97 mEq/L (ref 96–112)
Creatinine, Ser: 1.16 mg/dL (ref 0.40–1.50)
GFR: 62.05 mL/min (ref >60.00)
Glucose, Bld: 98 mg/dL (ref 70–99)
Potassium: 4.6 mEq/L (ref 3.5–5.1)
Sodium: 130 mEq/L — ABNORMAL LOW (ref 135–145)

## 2022-04-18 MED ORDER — PANTOPRAZOLE SODIUM 40 MG PO TBEC: 40.0000 mg | DELAYED_RELEASE_TABLET | Freq: Every day | ORAL | 1 refills | Status: DC

## 2022-04-18 NOTE — Progress Notes (Signed)
OFFICE VISIT  04/18/2022  CC:  Chief Complaint  Patient presents with   Urinary Retention    Pt is not fasting   Patient is a 76 y.o. male who presents for follow-up hyponatremia and questions about urine output. I last saw him 03/15/22. A/P as of that visit: "#1 hypotension, unknown etiology--apparently acute onset today. Other than some lethargy at the beginning of our visit patient appears remarkably well. I had him stand up in the exam room and he had no dizziness. Discussed possibilities in detail with patient and daughter, including go to the ER or call EMS. We all agreed that at this point since he is feeling well we will hold off from doing this. We will check bmet today.  EKG normal. Sent urine for culture and sensitivity. Signs/symptoms to call or return for were reviewed and pt expressed understanding.   #2 abnormal urinalysis. History of Pseudomonas urine colonization. Given problem #1 above will definitely start Cipro 500 mg twice daily x7 days. Sent urine specimen for culture and sensitivities.   #3 incontinence.  Suspect mixture of OAB and BPH. Patient improved with Myrbetriq 25 mg.  Will increase to 50 mg a day.  Not a good candidate for alpha-blocker due to his dizziness/hypotension.   #4 hyponatremia. He was taking Lasix when his low sodium was detected on 02/15/2022.   He stopped Lasix 2 weeks ago. recheck basic metabolic panel today."  HPI: Antonio Serrano is here to discuss hyponatremia again. He tells me stopped taking the Lasix only 11 days ago.  I thought he had discontinued it a couple of weeks prior to his last lab check.  He did note his urine output decreased after initially stopping it but says of late it is back up to about 1600 cc a day.  He says he consistently takes in about 1800 cc of fluids a day. States he feels like his urinary frequency and urgency is better on the 50 mg Myrbetriq daily. He does sometimes have a delay in getting his stream going but says  the stream is consistently strong.  Occasional feeling of incomplete emptying.  He does get up 3 times a night to urinate. We reviewed his med list again today and he says he is not taking trazodone or tramadol. His daily meds are pioglitazone, pantoprazole, Myrbetriq, and Plavix.  He denies feeling any fluid accumulation anywhere on his legs, abdomen, or hands.  ROS as above, plus--> no fevers, no CP, no SOB, no wheezing, no cough, no dizziness, no HAs, no rashes, no melena/hematochezia.  No polyuria or polydipsia.  No myalgias or arthralgias.  No focal weakness, paresthesias, or tremors.  No acute vision or hearing abnormalities.  No dysuria or hematuria.  No recent changes in lower legs. No n/v/d or abd pain.  No palpitations.     Past Medical History:  Diagnosis Date   Atherosclerosis of coronary artery    On chest CT->calcif in LAD and RCA   Chronic low back pain    MR 11/2021 showed L4-5 foraminal stenosis--> plan per Ortho was to do L4-5 injection   Chronic prostatitis    Very mild elevation of PSA (>4), referred to Urol where PSA repeat was 1.0 on 06/16/13.  Annual PSA repeat is all that is needed now per urologist.  Most recent 07/2015 was 0.45.   Chronic renal insufficiency, stage III (moderate) (HCC)    Baseline GFR as of 2019= 50s.     Closed right hip fracture ([place]) 03/2021  THA   Diabetes mellitus with complication ([place]) Dx'd fall 2011   HTN (hypertension)    Renal/aortic doppler u/s normal 06/2010   Hypercalcemia    Hyperlipidemia 2016   Statin intolerant--myalgias.  Pt refuses any further trial of statin as of 2018.   Nonspecific abnormal electrocardiogram (ECG) (EKG) Fall 2011   TWI in inferior leads: myocardial perfusion scan neg and echo normal 07/2010   Pseudomonas aeruginosa colonization    12/2021 urine clx   TIA (transient ischemic attack)    12/2021- CT angio head/neck no high grade stenosis.  MR showed old lacunar infarcts corona radiata, basal ganglia, thalamus.   Echo normal.   Tobacco dependence    UTI (lower urinary tract infection)    Feb 2012 (klebsiella--dx at [facility]); 03/2013 e coli.     Past Surgical History:  Procedure Laterality Date   CARDIOVASCULAR STRESS TEST  07/01/2010   Myocardial perfusion scan neg/low risk.   CATARACT EXTRACTION  2007, 2008   Bilateral ([facility] on [address].   COLONOSCOPY  12/20/04;12/2014   2016 no polyps: recall 5 yrs due to [place] of colon ca   ESOPHAGOGASTRODUODENOSCOPY  11/29/2004   esoph stricture/dilation   TOTAL HIP ARTHROPLASTY Right 04/23/2021   Procedure: TOTAL HIP ARTHROPLASTY ANTERIOR APPROACH;  Surgeon: Meredith Pel, MD;  Location: [facility];  Service: Orthopedics;  Laterality: Right;  Hana, C-arm, Depuy   TRANSTHORACIC ECHOCARDIOGRAM  07/01/2010   2011 EF =>55%; LA mildly dilated; trace MR/TR.  12/2021 EF 60-65%, DD indeterm, valves nl.    Outpatient Medications Prior to Visit  Medication Sig Dispense Refill   Docusate Calcium (STOOL SOFTENER PO) Take 1 tablet by mouth daily as needed (constipation).     ipratropium (ATROVENT) 0.03 % nasal spray USE 2 SPRAY(S) IN EACH NOSTRIL EVERY 12 HOURS 30 mL 11   mirabegron ER (MYRBETRIQ) 50 MG TB24 tablet Take 1 tablet (50 mg total) by mouth daily. 90 tablet 1   OVER THE COUNTER MEDICATION Take 2 tablets by mouth daily. Elderberry, zinc, and vitamin C combo medication OTC.     pioglitazone (ACTOS) 15 MG tablet 1 TABLET BY MOUTH ONCE DAILY 90 tablet 1   pantoprazole (PROTONIX) 40 MG tablet Take 1 tablet (40 mg total) by mouth daily. 30 tablet 2   clopidogrel (PLAVIX) 75 MG tablet Take 1 tablet (75 mg total) by mouth daily. 90 tablet 3   acetaminophen (TYLENOL) 500 MG tablet Take 1 tablet (500 mg total) by mouth every 6 (six) hours as needed for mild pain or fever (pain score 1-3 or temp > 100.5). (Patient not taking: Reported on 03/15/2022)     atorvastatin (LIPITOR) 10 MG tablet Take 1 tablet (10 mg total) by mouth daily. (Patient not  taking: Reported on 01/11/2022) 30 tablet 0   bismuth subsalicylate (PEPTO BISMOL) 262 MG/15ML suspension Take 30 mLs by mouth every 6 (six) hours as needed. Entered as standard dose (Patient not taking: Reported on 02/15/2022)     ciprofloxacin (CIPRO) 500 MG tablet Take 1 tablet (500 mg total) by mouth 2 (two) times daily. 14 tablet 0   furosemide (LASIX) 40 MG tablet 1 tab po qd as needed for legs swelling (Patient not taking: Reported on 02/15/2022) 30 tablet 1   traMADol (ULTRAM) 50 MG tablet Take 1 tablet (50 mg total) by mouth every 8 (eight) hours as needed. (Patient not taking: Reported on 03/15/2022) 60 tablet 0   traZODone (DESYREL) 50 MG tablet 1 tab po qhs as needed for  sleep. (Patient not taking: Reported on 04/18/2022) 90 tablet 1   methylPREDNISolone acetate (DEPO-MEDROL) injection 80 mg      No facility-administered medications prior to visit.    Allergies  Allergen Reactions   Bactrim [Sulfamethoxazole-Trimethoprim] Nausea Only    ROS As per HPI  PE:    04/18/2022   11:09 AM 03/15/2022    1:26 PM 02/15/2022    3:18 PM  Vitals with BMI  Height  '5\' 4"'$  '5\' 4"'$   Weight 157 lbs 3 oz 145 lbs 10 oz 151 lbs 10 oz  BMI  29.93 71.69  Systolic 678 95 938  Diastolic 74 57 74  Pulse 67 86 88   Physical Exam  Gen: Alert, well appearing.  Patient is oriented to person, place, time, and situation. AFFECT: pleasant, lucid thought and speech. CV: RRR, no m/r/g.   LUNGS: CTA bilat, nonlabored resps, good aeration in all lung fields. ABD: soft, NT/ND EXT: no clubbing or cyanosis.  no edema.    LABS:  Last CBC Lab Results  Component Value Date   WBC 6.8 02/05/2022   HGB 13.8 02/05/2022   HCT 40.3 02/05/2022   MCV 90.2 02/05/2022   MCH 30.9 02/05/2022   RDW 13.9 02/05/2022   PLT 236 02/05/2022   Lab Results  Component Value Date   TSH 2.256 07/17/5101   Last metabolic panel Lab Results  Component Value Date   GLUCOSE 162 (H) 02/15/2022   NA 127 (L) 02/15/2022   K 3.5  02/15/2022   CL 91 (L) 02/15/2022   CO2 26 02/15/2022   BUN 17 02/15/2022   CREATININE 1.42 02/15/2022   GFRNONAA 47 (L) 02/05/2022   CALCIUM 9.5 02/15/2022   PHOS 3.7 04/24/2021   PROT 6.3 (L) 01/04/2022   ALBUMIN 3.6 01/04/2022   LABGLOB 2.3 02/16/2019   AGRATIO 1.9 02/16/2019   BILITOT 0.7 01/04/2022   ALKPHOS 87 01/04/2022   AST 18 01/04/2022   ALT 13 01/04/2022   ANIONGAP 13 02/05/2022   Last hemoglobin A1c Lab Results  Component Value Date   HGBA1C 6.5 (H) 01/05/2022   IMPRESSION AND PLAN:  Hyponatremia, possibly due to increased free water intake while taking Lasix regularly. He drinks appropriate amount of fluids now and has quit taking his Lasix 11 days ago. Cannot rule out a component of SIADH, possibly from the tramadol and/or trazodone that he had been on in the past.  He has stopped taking these medications now. I feel like most of his urinary symptoms are due to bladder instability rather than significant outlet obstruction by the prostate. Recheck basic metabolic panel today.  An After Visit Summary was printed and given to the patient.  FOLLOW UP: Return in about 3 months (around 07/19/2022) for routine chronic illness f/u.  Signed:  Crissie Sickles, MD           04/18/2022

## 2022-07-12 ENCOUNTER — Ambulatory Visit: Payer: Medicare Other | Admitting: Family Medicine

## 2022-07-19 ENCOUNTER — Ambulatory Visit (INDEPENDENT_AMBULATORY_CARE_PROVIDER_SITE_OTHER): Payer: Medicare Other | Admitting: Family Medicine

## 2022-07-19 ENCOUNTER — Encounter: Payer: Self-pay | Admitting: Family Medicine

## 2022-07-19 VITALS — BP 163/76 | HR 79 | Temp 97.5°F | Ht 64.0 in | Wt 170.4 lb

## 2022-07-19 DIAGNOSIS — Z23 Encounter for immunization: Secondary | ICD-10-CM | POA: Diagnosis not present

## 2022-07-19 DIAGNOSIS — E871 Hypo-osmolality and hyponatremia: Secondary | ICD-10-CM

## 2022-07-19 DIAGNOSIS — I10 Essential (primary) hypertension: Secondary | ICD-10-CM

## 2022-07-19 DIAGNOSIS — E1121 Type 2 diabetes mellitus with diabetic nephropathy: Secondary | ICD-10-CM | POA: Diagnosis not present

## 2022-07-19 DIAGNOSIS — M7061 Trochanteric bursitis, right hip: Secondary | ICD-10-CM

## 2022-07-19 DIAGNOSIS — N1832 Chronic kidney disease, stage 3b: Secondary | ICD-10-CM | POA: Diagnosis not present

## 2022-07-19 DIAGNOSIS — Z8679 Personal history of other diseases of the circulatory system: Secondary | ICD-10-CM

## 2022-07-19 DIAGNOSIS — M67951 Unspecified disorder of synovium and tendon, right thigh: Secondary | ICD-10-CM

## 2022-07-19 LAB — POCT GLYCOSYLATED HEMOGLOBIN (HGB A1C)
HbA1c POC (<> result, manual entry): 5.7 % (ref 4.0–5.6)
HbA1c, POC (controlled diabetic range): 5.7 % (ref 0.0–7.0)
HbA1c, POC (prediabetic range): 5.7 % (ref 5.7–6.4)
Hemoglobin A1C: 5.7 % — AB (ref 4.0–5.6)

## 2022-07-19 MED ORDER — TETANUS-DIPHTH-ACELL PERTUSSIS 5-2-15.5 LF-MCG/0.5 IM SUSP: 0.5000 mL | Freq: Once | INTRAMUSCULAR | 0 refills | Status: AC

## 2022-07-19 NOTE — Progress Notes (Signed)
OFFICE VISIT  07/19/2022  CC:  Chief Complaint  Patient presents with   Diabetes   Chronic Kidney Disease    Patient is a 76 y.o. male who presents for follow-up diabetes with nephropathy, chronic renal insufficiency, hx of HTN, and hyponatremia. I last saw him 3 mo ago. A/P as of that visit: "Hyponatremia, possibly due to increased free water intake while taking Lasix regularly. He drinks appropriate amount of fluids now and has quit taking his Lasix 11 days ago. Cannot rule out a component of SIADH, possibly from the tramadol and/or trazodone that he had been on in the past.  He has stopped taking these medications now. I feel like most of his urinary symptoms are due to bladder instability rather than significant outlet obstruction by the prostate. Recheck basic metabolic panel today."  INTERIM HX: Feeling well other than right hip pain. He says about 2 months after his hip surgery back in July 2022 he noted a snap in the right hip the lateral aspect.  Since then it has hurt on and off over the greater troches region. He has had reeval by his orthopedic surgeon and they have deduced that his low back and his hip are without any abnormality that would explain his pain. He says the pain seemed to persist through a second course of PT after his surgery. Hurts him to lay on the right side of his hip, hurts with certain rotation of the right hip.  No radiation down the leg, no paresthesias, no leg weakness.  Blood pressures consistently less than 130 over 70s, only occasional elevation up to the 150s or 60s. He is not on any antihypertensives.  No home glucose monitoring.  He does take pioglitazone 15 mg a day. He has gained 13 pounds over the last 3 months and states this is due to caloric intake, does not feel like he has retained fluid.  He was on mirabegron at 1 point but states he stopped taking it and still empties his bladder just fine.  He does not take Lasix.  ROS as above,  plus--> no fevers, no CP, no SOB, no wheezing, no cough, no dizziness, no HAs, no rashes, no melena/hematochezia.  No polyuria or polydipsia.  No focal weakness, paresthesias, or tremors.  No acute vision or hearing abnormalities.  No dysuria or unusual/new urinary urgency or frequency.  No recent changes in lower legs. No n/v/d or abd pain.  No palpitations.     Past Medical History:  Diagnosis Date   Atherosclerosis of coronary artery    On chest CT->calcif in LAD and RCA   Chronic low back pain    MR 11/2021 showed L4-5 foraminal stenosis--> plan per Ortho was to do L4-5 injection   Chronic prostatitis    Very mild elevation of PSA (>4), referred to Urol where PSA repeat was 1.0 on 06/16/13.  Annual PSA repeat is all that is needed now per urologist.  Most recent 07/2015 was 0.45.   Chronic renal insufficiency, stage III (moderate) (HCC)    Baseline GFR as of 2019= 50s.     Closed right hip fracture ([place]) 03/2021   THA   Diabetes mellitus with complication ([place]) Dx'd fall 2011   HTN (hypertension)    Renal/aortic doppler u/s normal 06/2010   Hypercalcemia    Hyperlipidemia 2016   Statin intolerant--myalgias.  Pt refuses any further trial of statin as of 2018.   Nonspecific abnormal electrocardiogram (ECG) (EKG) Fall 2011   TWI in inferior  leads: myocardial perfusion scan neg and echo normal 07/2010   Pseudomonas aeruginosa colonization    12/2021 urine clx   TIA (transient ischemic attack)    12/2021- CT angio head/neck no high grade stenosis.  MR showed old lacunar infarcts corona radiata, basal ganglia, thalamus.  Echo normal.   Tobacco dependence    UTI (lower urinary tract infection)    Feb 2012 (klebsiella--dx at [facility]); 03/2013 e coli.     Past Surgical History:  Procedure Laterality Date   CARDIOVASCULAR STRESS TEST  07/01/2010   Myocardial perfusion scan neg/low risk.   CATARACT EXTRACTION  2007, 2008   Bilateral ([facility] on [address].    COLONOSCOPY  12/20/04;12/2014   2016 no polyps: recall 5 yrs due to [place] of colon ca   ESOPHAGOGASTRODUODENOSCOPY  11/29/2004   esoph stricture/dilation   TOTAL HIP ARTHROPLASTY Right 04/23/2021   Procedure: TOTAL HIP ARTHROPLASTY ANTERIOR APPROACH;  Surgeon: Meredith Pel, MD;  Location: [facility];  Service: Orthopedics;  Laterality: Right;  Hana, C-arm, Depuy   TRANSTHORACIC ECHOCARDIOGRAM  07/01/2010   2011 EF =>55%; LA mildly dilated; trace MR/TR.  12/2021 EF 60-65%, DD indeterm, valves nl.    Outpatient Medications Prior to Visit  Medication Sig Dispense Refill   clopidogrel (PLAVIX) 75 MG tablet Take 1 tablet (75 mg total) by mouth daily. 90 tablet 3   ipratropium (ATROVENT) 0.03 % nasal spray USE 2 SPRAY(S) IN EACH NOSTRIL EVERY 12 HOURS 30 mL 11   mirabegron ER (MYRBETRIQ) 50 MG TB24 tablet Take 1 tablet (50 mg total) by mouth daily. 90 tablet 1   OVER THE COUNTER MEDICATION Take 2 tablets by mouth daily. Elderberry, zinc, and vitamin C combo medication OTC.     pantoprazole (PROTONIX) 40 MG tablet Take 1 tablet (40 mg total) by mouth daily. 90 tablet 1   pioglitazone (ACTOS) 15 MG tablet 1 TABLET BY MOUTH ONCE DAILY 90 tablet 1   Docusate Calcium (STOOL SOFTENER PO) Take 1 tablet by mouth daily as needed (constipation). (Patient not taking: Reported on 07/19/2022)     No facility-administered medications prior to visit.    Allergies  Allergen Reactions   Bactrim [Sulfamethoxazole-Trimethoprim] Nausea Only    ROS As per HPI  PE:    07/19/2022    2:02 PM 04/18/2022   11:09 AM 03/15/2022    1:26 PM  Vitals with BMI  Height '5\' 4"'$   '5\' 4"'$   Weight 170 lbs 6 oz 157 lbs 3 oz 145 lbs 10 oz  BMI 16.01  09.32  Systolic 355 732 95  Diastolic 76 74 57  Pulse 79 67 86   Physical Exam  Gen: Alert, well appearing.  Patient is oriented to person, place, time, and situation. AFFECT: pleasant, lucid thought and speech. CV: RRR, no m/r/g.   LUNGS: CTA bilat, nonlabored resps, good  aeration in all lung fields. Right hip: Tender to palpation of greater trochanter.  Minimal extension of this tenderness over the distal aspects of gluteus medius and gluteus minimus. He has some limited range of motion with active and passive movement of the hip but no pain except with the extremes of external rotation he feels some lateral hip pain. Resisted hip flexion and extension without pain.  Resisted knee flexion and extension without pain in the hip or knee. Remedies show 1+ pitting edema in ankles, right just a little bit more than the left.  LABS:  Last CBC Lab Results  Component Value Date   WBC 6.8  02/05/2022   HGB 13.8 02/05/2022   HCT 40.3 02/05/2022   MCV 90.2 02/05/2022   MCH 30.9 02/05/2022   RDW 13.9 02/05/2022   PLT 236 00/37/0488   Last metabolic panel Lab Results  Component Value Date   GLUCOSE 98 04/18/2022   NA 130 (L) 04/18/2022   K 4.6 04/18/2022   CL 97 04/18/2022   CO2 27 04/18/2022   BUN 11 04/18/2022   CREATININE 1.16 04/18/2022   GFRNONAA 47 (L) 02/05/2022   CALCIUM 9.3 04/18/2022   PHOS 3.7 04/24/2021   PROT 6.3 (L) 01/04/2022   ALBUMIN 3.6 01/04/2022   LABGLOB 2.3 02/16/2019   AGRATIO 1.9 02/16/2019   BILITOT 0.7 01/04/2022   ALKPHOS 87 01/04/2022   AST 18 01/04/2022   ALT 13 01/04/2022   ANIONGAP 13 02/05/2022   Last lipids Lab Results  Component Value Date   CHOL 135 01/05/2022   HDL 46 01/05/2022   LDLCALC 70 01/05/2022   TRIG 95 01/05/2022   CHOLHDL 2.9 01/05/2022   Last hemoglobin A1c Lab Results  Component Value Date   HGBA1C 6.5 (H) 01/05/2022   Last thyroid functions Lab Results  Component Value Date   TSH 2.256 01/05/2022   IMPRESSION AND PLAN:  #1 diabetes with nephropathy. Excellent control on Actos 15 mg a day. POC Hba1c 5.7% today. Continue Actos 15 mg a day. Urine microalbumin/creatinine today.  #2 hypertension. History of orthostatic hypotension.  Blood pressure still consistently within normal  limits off of all blood pressure medication.  #3 right hip gluteal tendinopathy.   Reassured. If persistently worsens then we can inject trochanteric bursa.  #4 overactive bladder. Symptoms not bothersome anymore--even since discontinuing mirabegron. He has a few of these tablets left and will restart them if needed.  He can call for refill if needed.  #5 chronic renal insufficiency stage III. Monitor electrolytes and creatinine today.  An After Visit Summary was printed and given to the patient.  FOLLOW UP: No follow-ups on file.  Signed:  Crissie Sickles, MD           07/19/2022

## 2022-07-20 LAB — BASIC METABOLIC PANEL
BUN: 12 mg/dL (ref 6–23)
CO2: 24 mEq/L (ref 19–32)
Calcium: 9.1 mg/dL (ref 8.4–10.5)
Chloride: 97 mEq/L (ref 96–112)
Creatinine, Ser: 1.38 mg/dL (ref 0.40–1.50)
GFR: 50.29 mL/min — ABNORMAL LOW (ref >60.00)
Glucose, Bld: 105 mg/dL — ABNORMAL HIGH (ref 70–99)
Potassium: 4.4 mEq/L (ref 3.5–5.1)
Sodium: 131 mEq/L — ABNORMAL LOW (ref 135–145)

## 2022-07-20 LAB — MICROALBUMIN / CREATININE URINE RATIO
Creatinine,U: 125.7 mg/dL
Microalb Creat Ratio: 6.4 mg/g (ref 0.0–30.0)
Microalb, Ur: 8 mg/dL — ABNORMAL HIGH (ref 0.0–1.9)

## 2022-07-23 ENCOUNTER — Telehealth: Payer: Self-pay | Admitting: Family Medicine

## 2022-07-23 NOTE — Telephone Encounter (Signed)
Noted  

## 2022-07-23 NOTE — Telephone Encounter (Signed)
Informed pt of his results and he is fine with them.

## 2022-10-19 ENCOUNTER — Ambulatory Visit: Payer: Medicare Other | Admitting: Family Medicine

## 2022-10-22 ENCOUNTER — Other Ambulatory Visit: Payer: Self-pay | Admitting: Family Medicine

## 2022-10-26 ENCOUNTER — Encounter: Payer: Self-pay | Admitting: Family Medicine

## 2022-10-26 ENCOUNTER — Ambulatory Visit (INDEPENDENT_AMBULATORY_CARE_PROVIDER_SITE_OTHER): Payer: Medicare Other | Admitting: Family Medicine

## 2022-10-26 VITALS — BP 170/90 | HR 70 | Temp 97.5°F | Ht 64.0 in | Wt 160.2 lb

## 2022-10-26 DIAGNOSIS — E1121 Type 2 diabetes mellitus with diabetic nephropathy: Secondary | ICD-10-CM

## 2022-10-26 DIAGNOSIS — I10 Essential (primary) hypertension: Secondary | ICD-10-CM

## 2022-10-26 DIAGNOSIS — N183 Chronic kidney disease, stage 3 unspecified: Secondary | ICD-10-CM

## 2022-10-26 LAB — POCT GLYCOSYLATED HEMOGLOBIN (HGB A1C)
HbA1c POC (<> result, manual entry): 6 % (ref 4.0–5.6)
HbA1c, POC (controlled diabetic range): 6 % (ref 0.0–7.0)
HbA1c, POC (prediabetic range): 6 % (ref 5.7–6.4)
Hemoglobin A1C: 6 % — AB (ref 4.0–5.6)

## 2022-10-26 LAB — COMPREHENSIVE METABOLIC PANEL
ALT: 9 U/L (ref 0–53)
AST: 12 U/L (ref 0–37)
Albumin: 4.2 g/dL (ref 3.5–5.2)
Alkaline Phosphatase: 122 U/L — ABNORMAL HIGH (ref 39–117)
BUN: 8 mg/dL (ref 6–23)
CO2: 26 mEq/L (ref 19–32)
Calcium: 9.2 mg/dL (ref 8.4–10.5)
Chloride: 97 mEq/L (ref 96–112)
Creatinine, Ser: 1.22 mg/dL (ref 0.40–1.50)
GFR: 58.19 mL/min — ABNORMAL LOW (ref >60.00)
Glucose, Bld: 96 mg/dL (ref 70–99)
Potassium: 4.2 mEq/L (ref 3.5–5.1)
Sodium: 133 mEq/L — ABNORMAL LOW (ref 135–145)
Total Bilirubin: 0.5 mg/dL (ref 0.2–1.2)
Total Protein: 7 g/dL (ref 6.0–8.3)

## 2022-10-26 MED ORDER — AMLODIPINE BESYLATE 2.5 MG PO TABS: 2.5000 mg | ORAL_TABLET | Freq: Every day | ORAL | 0 refills | Status: DC

## 2022-10-26 NOTE — Progress Notes (Signed)
OFFICE VISIT  10/26/2022  CC:  Chief Complaint  Patient presents with   Medical Management of Chronic Issues    Pt is not fasting    Patient is a 77 y.o. male who presents for 3 mo f/u DM, HTN, and CRI III. A/P as of last visit: "#1 diabetes with nephropathy. Excellent control on Actos 15 mg a day. POC Hba1c 5.7% today. Continue Actos 15 mg a day. Urine microalbumin/creatinine today.   #2 hypertension. History of orthostatic hypotension.  Blood pressure still consistently within normal limits off of all blood pressure medication.   #3 right hip gluteal tendinopathy.   Reassured. If persistently worsens then we can inject trochanteric bursa.   #4 overactive bladder. Symptoms not bothersome anymore--even since discontinuing mirabegron. He has a few of these tablets left and will restart them if needed.  He can call for refill if needed.   #5 chronic renal insufficiency stage III. Monitor electrolytes and creatinine today."  INTERIM HX: Feeling fine. Glucoses consistently around 100. Bps 856D systolic.  No hypotension. No Has, dizziness, or acute weakness  R hip pain sx's have resolved.  ROS as above, plus--> no fevers, no CP, no SOB, no wheezing, no cough, no dizziness, no HAs, no rashes, no melena/hematochezia.  No polyuria or polydipsia.  No myalgias or arthralgias.  No focal weakness, paresthesias, or tremors.  No acute vision or hearing abnormalities.  No dysuria or unusual/new urinary urgency or frequency.  No recent changes in lower legs. No n/v/d or abd pain.  No palpitations.    Past Medical History:  Diagnosis Date   Atherosclerosis of coronary artery    On chest CT->calcif in LAD and RCA   Chronic low back pain    MR 11/2021 showed L4-5 foraminal stenosis--> plan per Ortho was to do L4-5 injection   Chronic prostatitis    Very mild elevation of PSA (>4), referred to U[facility] where PSA repeat was 1.0 on 06/16/13.  Annual PSA repeat is all that is needed now per  urologist.  Most recent 07/2015 was 0.45.   Chronic renal insufficiency, stage III (moderate) (HCC)    Baseline GFR as of 2019= 50s.     Closed right hip fracture ([place]) 03/2021   THA   Diabetes mellitus with complication ([place]) Dx'd fall 2011   HTN (hypertension)    Renal/aortic doppler u/s normal 06/2010   Hypercalcemia    Hyperlipidemia 2016   Statin intolerant--myalgias.  Pt refuses any further trial of statin as of 2018.   Nonspecific abnormal electrocardiogram (ECG) (EKG) Fall 2011   TWI in inferior leads: myocardial perfusion scan neg and echo normal 07/2010   Pseudomonas aeruginosa colonization    12/2021 urine clx   TIA (transient ischemic attack)    12/2021- CT angio head/neck no high grade stenosis.  MR showed old lacunar infarcts corona radiata, basal ganglia, thalamus.  Echo normal.   Tobacco dependence    UTI (lower urinary tract infection)    Feb 2012 (klebsiella--dx at [facility]); 03/2013 e coli.     Past Surgical History:  Procedure Laterality Date   CARDIOVASCULAR STRESS TEST  07/01/2010   Myocardial perfusion scan neg/low risk.   CATARACT EXTRACTION  2007, 2008   Bilateral ([facility] on [address].   COLONOSCOPY  12/20/04;12/2014   2016 no polyps: recall 5 yrs due to [place] of colon ca   ESOPHAGOGASTRODUODENOSCOPY  11/29/2004   esoph stricture/dilation   TOTAL HIP ARTHROPLASTY Right 04/23/2021   Procedure: TOTAL HIP ARTHROPLASTY ANTERIOR  APPROACH;  Surgeon: Meredith Pel, MD;  Location: [facility];  Service: Orthopedics;  Laterality: Right;  Hana, C-arm, Depuy   TRANSTHORACIC ECHOCARDIOGRAM  07/01/2010   2011 EF =>55%; LA mildly dilated; trace MR/TR.  12/2021 EF 60-65%, DD indeterm, valves nl.    Outpatient Medications Prior to Visit  Medication Sig Dispense Refill   ipratropium (ATROVENT) 0.03 % nasal spray USE 2 SPRAY(S) IN EACH NOSTRIL EVERY 12 HOURS 30 mL 11   mirabegron ER (MYRBETRIQ) 50 MG TB24 tablet Take 1 tablet (50 mg total) by mouth daily.  90 tablet 1   OVER THE COUNTER MEDICATION Take 2 tablets by mouth daily. Elderberry, zinc, and vitamin C combo medication OTC.     pantoprazole (PROTONIX) 40 MG tablet Take 1 tablet by mouth once daily 90 tablet 0   pioglitazone (ACTOS) 15 MG tablet 1 TABLET BY MOUTH ONCE DAILY 90 tablet 1   clopidogrel (PLAVIX) 75 MG tablet Take 1 tablet (75 mg total) by mouth daily. (Patient not taking: Reported on 10/26/2022) 90 tablet 3   Docusate Calcium (STOOL SOFTENER PO) Take 1 tablet by mouth daily as needed (constipation). (Patient not taking: Reported on 07/19/2022)     No facility-administered medications prior to visit.    Allergies  Allergen Reactions   Bactrim [Sulfamethoxazole-Trimethoprim] Nausea Only    Review of Systems As per HPI  PE:    10/26/2022    2:07 PM 10/26/2022    1:41 PM 07/19/2022    2:02 PM  Vitals with BMI  Height  '5\' 4"'$  '5\' 4"'$   Weight  160 lbs 3 oz 170 lbs 6 oz  BMI  33.29 51.88  Systolic 416 606 301  Diastolic 90 88 76  Pulse  70 79  Initial bp today 191/88, P 70  Physical Exam  Gen: Alert, well appearing.  Patient is oriented to person, place, time, and situation. AFFECT: pleasant, lucid thought and speech. CV: RRR (70 by me), no m/r/g.   LUNGS: CTA bilat, nonlabored resps, good aeration in all lung fields. EXT: no clubbing or cyanosis.  no edema.    LABS:  Last CBC Lab Results  Component Value Date   WBC 6.8 02/05/2022   HGB 13.8 02/05/2022   HCT 40.3 02/05/2022   MCV 90.2 02/05/2022   MCH 30.9 02/05/2022   RDW 13.9 02/05/2022   PLT 236 60/07/9322   Last metabolic panel Lab Results  Component Value Date   GLUCOSE 105 (H) 07/19/2022   NA 131 (L) 07/19/2022   K 4.4 07/19/2022   CL 97 07/19/2022   CO2 24 07/19/2022   BUN 12 07/19/2022   CREATININE 1.38 07/19/2022   GFRNONAA 47 (L) 02/05/2022   CALCIUM 9.1 07/19/2022   PHOS 3.7 04/24/2021   PROT 6.3 (L) 01/04/2022   ALBUMIN 3.6 01/04/2022   LABGLOB 2.3 02/16/2019   AGRATIO 1.9  02/16/2019   BILITOT 0.7 01/04/2022   ALKPHOS 87 01/04/2022   AST 18 01/04/2022   ALT 13 01/04/2022   ANIONGAP 13 02/05/2022   Last lipids Lab Results  Component Value Date   CHOL 135 01/05/2022   HDL 46 01/05/2022   LDLCALC 70 01/05/2022   TRIG 95 01/05/2022   CHOLHDL 2.9 01/05/2022   Last hemoglobin A1c Lab Results  Component Value Date   HGBA1C 6.0 (A) 10/26/2022   HGBA1C 6.0 10/26/2022   HGBA1C 6.0 10/26/2022   HGBA1C 6.0 10/26/2022   IMPRESSION AND PLAN:  #1 hypertension, labile.  History of orthostatic hypotension as well-->  attributed to his beta-blocker. Pressure has gone up again since off medications: 211Z systolic at home and the 735-670 systolic range here, diastolic 90. He has a history of hypotension and bradycardia while on his beta-blocker.  Will start him on amlodipine 2.5 mg daily and expect to gradually increase this. Monitor BP at home twice a day and we will review these in 7 to 10 days. Electrolytes and creatinine today.  2.  Diabetes with nephropathy. POC Hba1c today is 6%. Continue pioglitazone 15 mg a day.  #3 chronic renal insufficiency stage III. Avoid NSAIDs.  Work on hydration. Electrolytes and creatinine today.  An After Visit Summary was printed and given to the patient.  FOLLOW UP: Return for 7-10d f/u HTN. Cpe 3 mo  Signed:  Crissie Sickles, MD           10/26/2022

## 2022-11-02 ENCOUNTER — Encounter: Payer: Self-pay | Admitting: Family Medicine

## 2022-11-02 ENCOUNTER — Ambulatory Visit (INDEPENDENT_AMBULATORY_CARE_PROVIDER_SITE_OTHER): Payer: Medicare Other | Admitting: Family Medicine

## 2022-11-02 VITALS — BP 171/74 | HR 71 | Temp 97.4°F | Ht 64.0 in | Wt 160.2 lb

## 2022-11-02 DIAGNOSIS — I10 Essential (primary) hypertension: Secondary | ICD-10-CM

## 2022-11-02 NOTE — Progress Notes (Signed)
OFFICE VISIT  11/02/2022  CC:  Chief Complaint  Patient presents with   Medical Management of Chronic Issues    hypertension   Patient is a 77 y.o. male who presents for 1 week f/u HTN. A/P as of last visit: "#1 hypertension, labile.  History of orthostatic hypotension as well--> attributed to his beta-blocker. Pressure has gone up again since off medications: 329J systolic at home and the 188-416 systolic range here, diastolic 90. He has a history of hypotension and bradycardia while on his beta-blocker.  Will start him on amlodipine 2.5 mg daily and expect to gradually increase this. Monitor BP at home twice a day and we will review these in 7 to 10 days. Electrolytes and creatinine today.   2.  Diabetes with nephropathy. POC Hba1c today is 6%. Continue pioglitazone 15 mg a day.   #3 chronic renal insufficiency stage III. Avoid NSAIDs.  Work on hydration. Electrolytes and creatinine today."  INTERIM HX: John is feeling well. Has been taking amlodipine 2.5 mg a day and blood pressures have come down some: Systolics ranging upper 606T to 016W, diastolics pretty consistently in the 70s.  Heart rate typically around 70. No adverse side effects from the amlodipine. No lower extremity swelling.  Past Medical History:  Diagnosis Date   Atherosclerosis of coronary artery    On chest CT->calcif in LAD and RCA   Chronic low back pain    MR 11/2021 showed L4-5 foraminal stenosis--> plan per Ortho was to do L4-5 injection   Chronic prostatitis    Very mild elevation of PSA (>4), referred to Urol where PSA repeat was 1.0 on 06/16/13.  Annual PSA repeat is all that is needed now per urologist.  Most recent 07/2015 was 0.45.   Chronic renal insufficiency, stage III (moderate) (HCC)    Baseline GFR as of 2019= 50s.     Closed right hip fracture ([place]) 03/2021   THA   Diabetes mellitus with complication ([place]) Dx'd fall 2011   HTN (hypertension)    Renal/aortic doppler u/s normal 06/2010    Hypercalcemia    Hyperlipidemia 2016   Statin intolerant--myalgias.  Pt refuses any further trial of statin as of 2018.   Nonspecific abnormal electrocardiogram (ECG) (EKG) Fall 2011   TWI in inferior leads: myocardial perfusion scan neg and echo normal 07/2010   Pseudomonas aeruginosa colonization    12/2021 urine clx   TIA (transient ischemic attack)    12/2021- CT angio head/neck no high grade stenosis.  MR showed old lacunar infarcts corona radiata, basal ganglia, thalamus.  Echo normal.   Tobacco dependence    UTI (lower urinary tract infection)    Feb 2012 (klebsiella--dx at [facility]); 03/2013 e coli.     Past Surgical History:  Procedure Laterality Date   CARDIOVASCULAR STRESS TEST  07/01/2010   Myocardial perfusion scan neg/low risk.   CATARACT EXTRACTION  2007, 2008   Bilateral ([facility] on [address].   COLONOSCOPY  12/20/04;12/2014   2016 no polyps: recall 5 yrs due to [place] of colon ca   ESOPHAGOGASTRODUODENOSCOPY  11/29/2004   esoph stricture/dilation   TOTAL HIP ARTHROPLASTY Right 04/23/2021   Procedure: TOTAL HIP ARTHROPLASTY ANTERIOR APPROACH;  Surgeon: Meredith Pel, MD;  Location: [facility];  Service: Orthopedics;  Laterality: Right;  Hana, C-arm, Depuy   TRANSTHORACIC ECHOCARDIOGRAM  07/01/2010   2011 EF =>55%; LA mildly dilated; trace MR/TR.  12/2021 EF 60-65%, DD indeterm, valves nl.    Outpatient Medications Prior to Visit  Medication Sig Dispense Refill   amLODipine (NORVASC) 2.5 MG tablet Take 1 tablet (2.5 mg total) by mouth daily. 30 tablet 0   ipratropium (ATROVENT) 0.03 % nasal spray USE 2 SPRAY(S) IN EACH NOSTRIL EVERY 12 HOURS 30 mL 11   mirabegron ER (MYRBETRIQ) 50 MG TB24 tablet Take 1 tablet (50 mg total) by mouth daily. 90 tablet 1   OVER THE COUNTER MEDICATION Take 2 tablets by mouth daily. Elderberry, zinc, and vitamin C combo medication OTC.     pantoprazole (PROTONIX) 40 MG tablet Take 1 tablet by mouth once daily 90 tablet 0    pioglitazone (ACTOS) 15 MG tablet 1 TABLET BY MOUTH ONCE DAILY 90 tablet 1   clopidogrel (PLAVIX) 75 MG tablet Take 1 tablet (75 mg total) by mouth daily. (Patient not taking: Reported on 10/26/2022) 90 tablet 3   Docusate Calcium (STOOL SOFTENER PO) Take 1 tablet by mouth daily as needed (constipation). (Patient not taking: Reported on 07/19/2022)     No facility-administered medications prior to visit.    Allergies  Allergen Reactions   Bactrim [Sulfamethoxazole-Trimethoprim] Nausea Only    Review of Systems As per HPI  PE:    11/02/2022    2:12 PM 11/02/2022    1:57 PM 10/26/2022    2:07 PM  Vitals with BMI  Height  '5\' 4"'$    Weight  160 lbs 3 oz   BMI  14.43   Systolic 154 008 676  Diastolic 74 83 90  Pulse  71      Physical Exam  Gen: Alert, well appearing.  Patient is oriented to person, place, time, and situation. AFFECT: pleasant, lucid thought and speech. Extremities: No edema  LABS:  Last metabolic panel Lab Results  Component Value Date   GLUCOSE 96 10/26/2022   NA 133 (L) 10/26/2022   K 4.2 10/26/2022   CL 97 10/26/2022   CO2 26 10/26/2022   BUN 8 10/26/2022   CREATININE 1.22 10/26/2022   GFRNONAA 47 (L) 02/05/2022   CALCIUM 9.2 10/26/2022   PHOS 3.7 04/24/2021   PROT 7.0 10/26/2022   ALBUMIN 4.2 10/26/2022   LABGLOB 2.3 02/16/2019   AGRATIO 1.9 02/16/2019   BILITOT 0.5 10/26/2022   ALKPHOS 122 (H) 10/26/2022   AST 12 10/26/2022   ALT 9 10/26/2022   ANIONGAP 13 02/05/2022   IMPRESSION AND PLAN:  #1 hypertension in the setting of chronic renal insufficiency stage III. Some improvement since starting amlodipine 2.5 mg a day. Increased to 5 mg/day. Last basic metabolic panel 1 week ago showed normal electrolytes and stable renal function. He will continue to work on good hydration habits.  An After Visit Summary was printed and given to the patient.  FOLLOW UP: Return in about 10 days (around 11/12/2022) for f/u HTN.  Signed:  Crissie Sickles, MD            11/02/2022

## 2022-11-05 ENCOUNTER — Telehealth: Payer: Self-pay

## 2022-11-05 MED ORDER — AMLODIPINE BESYLATE 2.5 MG PO TABS: 5.0000 mg | ORAL_TABLET | Freq: Every day | ORAL | 0 refills | Status: DC

## 2022-11-05 NOTE — Telephone Encounter (Signed)
15 d/s sent.

## 2022-11-05 NOTE — Telephone Encounter (Signed)
Patient refill request.  Patient here last week; pt thought he had enough meds to last him until next appt. But he does not.  Dosage changed - needs new prescription with  Taking 2.'5mg'$  taking twice daily.  [place]  amLODipine (NORVASC) 2.5 MG tablet

## 2022-11-12 ENCOUNTER — Ambulatory Visit (INDEPENDENT_AMBULATORY_CARE_PROVIDER_SITE_OTHER): Payer: Medicare Other | Admitting: Family Medicine

## 2022-11-12 ENCOUNTER — Encounter: Payer: Self-pay | Admitting: Family Medicine

## 2022-11-12 ENCOUNTER — Ambulatory Visit: Payer: Medicare Other | Admitting: Family Medicine

## 2022-11-12 VITALS — BP 113/69 | HR 99 | Temp 97.8°F | Ht 64.0 in | Wt 161.0 lb

## 2022-11-12 DIAGNOSIS — I10 Essential (primary) hypertension: Secondary | ICD-10-CM

## 2022-11-12 DIAGNOSIS — N3001 Acute cystitis with hematuria: Secondary | ICD-10-CM

## 2022-11-12 DIAGNOSIS — Z2239 Carrier of other specified bacterial diseases: Secondary | ICD-10-CM | POA: Diagnosis not present

## 2022-11-12 DIAGNOSIS — R350 Frequency of micturition: Secondary | ICD-10-CM

## 2022-11-12 LAB — POCT URINALYSIS DIPSTICK
Bilirubin, UA: NEGATIVE
Glucose, UA: NEGATIVE
Ketones, UA: NEGATIVE
Nitrite, UA: NEGATIVE
Protein, UA: NEGATIVE
Spec Grav, UA: 1.01 (ref 1.010–1.025)
Urobilinogen, UA: 0.2 E.U./dL
pH, UA: 6 (ref 5.0–8.0)

## 2022-11-12 MED ORDER — AMLODIPINE BESYLATE 5 MG PO TABS: 5.0000 mg | ORAL_TABLET | Freq: Every day | ORAL | 3 refills | Status: DC

## 2022-11-12 MED ORDER — CIPROFLOXACIN HCL 500 MG PO TABS: 500.0000 mg | ORAL_TABLET | Freq: Two times a day (BID) | ORAL | 0 refills | Status: AC

## 2022-11-12 NOTE — Progress Notes (Signed)
OFFICE VISIT  11/12/2022  CC:  Chief Complaint  Patient presents with   Hypertension   Patient is a 77 y.o. male who presents for 1 wk f/u HTN. A/P as of last visit: "#1 hypertension in the setting of chronic renal insufficiency stage III. Some improvement since starting amlodipine 2.5 mg a day. Increased to 5 mg/day. Last basic metabolic panel 1 week ago showed normal electrolytes and stable renal function. He will continue to work on good hydration habits."  INTERIM HX: 2 days ago started with urinary urgency, frequency, and discomfort.  Not much urine coming out with each urination. Here today in the waiting room he had complete loss of his bladder--large volume. No gross hematuria, abdominal pain, flank pain, or fever.  Home bp's: 130-156 over 69-83.  No HR data.   Past Medical History:  Diagnosis Date   Atherosclerosis of coronary artery    On chest CT->calcif in LAD and RCA   Chronic low back pain    MR 11/2021 showed L4-5 foraminal stenosis--> plan per Ortho was to do L4-5 injection   Chronic prostatitis    Very mild elevation of PSA (>4), referred to Urol where PSA repeat was 1.0 on 06/16/13.  Annual PSA repeat is all that is needed now per urologist.  Most recent 07/2015 was 0.45.   Chronic renal insufficiency, stage III (moderate) (HCC)    Baseline GFR as of 2019= 50s.     Closed right hip fracture ([place]) 03/2021   THA   Diabetes mellitus with complication ([place]) Dx'd fall 2011   HTN (hypertension)    Renal/aortic doppler u/s normal 06/2010   Hypercalcemia    Hyperlipidemia 2016   Statin intolerant--myalgias.  Pt refuses any further trial of statin as of 2018.   Nonspecific abnormal electrocardiogram (ECG) (EKG) Fall 2011   TWI in inferior leads: myocardial perfusion scan neg and echo normal 07/2010   Pseudomonas aeruginosa colonization    12/2021 urine clx   TIA (transient ischemic attack)    12/2021- CT angio head/neck no high grade stenosis.  MR showed old lacunar  infarcts corona radiata, basal ganglia, thalamus.  Echo normal.   Tobacco dependence    UTI (lower urinary tract infection)    Feb 2012 (klebsiella--dx at [facility]); 03/2013 e coli.     Past Surgical History:  Procedure Laterality Date   CARDIOVASCULAR STRESS TEST  07/01/2010   Myocardial perfusion scan neg/low risk.   CATARACT EXTRACTION  2007, 2008   Bilateral ([facility] on [address].   COLONOSCOPY  12/20/04;12/2014   2016 no polyps: recall 5 yrs due to [place] of colon ca   ESOPHAGOGASTRODUODENOSCOPY  11/29/2004   esoph stricture/dilation   TOTAL HIP ARTHROPLASTY Right 04/23/2021   Procedure: TOTAL HIP ARTHROPLASTY ANTERIOR APPROACH;  Surgeon: Meredith Pel, MD;  Location: [facility];  Service: Orthopedics;  Laterality: Right;  Hana, C-arm, Depuy   TRANSTHORACIC ECHOCARDIOGRAM  07/01/2010   2011 EF =>55%; LA mildly dilated; trace MR/TR.  12/2021 EF 60-65%, DD indeterm, valves nl.    Outpatient Medications Prior to Visit  Medication Sig Dispense Refill   clopidogrel (PLAVIX) 75 MG tablet Take 1 tablet (75 mg total) by mouth daily. 90 tablet 3   Docusate Calcium (STOOL SOFTENER PO) Take 1 tablet by mouth daily as needed (constipation).     ipratropium (ATROVENT) 0.03 % nasal spray USE 2 SPRAY(S) IN EACH NOSTRIL EVERY 12 HOURS 30 mL 11   mirabegron ER (MYRBETRIQ) 50 MG TB24 tablet Take 1 tablet (  50 mg total) by mouth daily. 90 tablet 1   OVER THE COUNTER MEDICATION Take 2 tablets by mouth daily. Elderberry, zinc, and vitamin C combo medication OTC.     pantoprazole (PROTONIX) 40 MG tablet Take 1 tablet by mouth once daily 90 tablet 0   pioglitazone (ACTOS) 15 MG tablet 1 TABLET BY MOUTH ONCE DAILY 90 tablet 1   amLODipine (NORVASC) 2.5 MG tablet Take 2 tablets (5 mg total) by mouth daily. 30 tablet 0   No facility-administered medications prior to visit.    Allergies  Allergen Reactions   Bactrim [Sulfamethoxazole-Trimethoprim] Nausea Only    Review of  Systems As per HPI  PE:    11/12/2022   11:30 AM 11/02/2022    2:12 PM 11/02/2022    1:57 PM  Vitals with BMI  Height 5' 4"$   5' 4"$   Weight 161 lbs  160 lbs 3 oz  BMI 123456  [id number]  Systolic 123456 [id number]  Diastolic 69 74 83  Pulse 99  71     Physical Exam  Gen: Alert, well appearing.  Patient is oriented to person, place, time, and situation. AFFECT: pleasant, lucid thought and speech. No further exam today  LABS:  Last metabolic panel Lab Results  Component Value Date   GLUCOSE 96 10/26/2022   NA 133 (L) 10/26/2022   K 4.2 10/26/2022   CL 97 10/26/2022   CO2 26 10/26/2022   BUN 8 10/26/2022   CREATININE 1.22 10/26/2022   GFRNONAA 47 (L) 02/05/2022   CALCIUM 9.2 10/26/2022   PHOS 3.7 04/24/2021   PROT 7.0 10/26/2022   ALBUMIN 4.2 10/26/2022   LABGLOB 2.3 02/16/2019   AGRATIO 1.9 02/16/2019   BILITOT 0.5 10/26/2022   ALKPHOS 122 (H) 10/26/2022   AST 12 10/26/2022   ALT 9 10/26/2022   ANIONGAP 13 02/05/2022   Last hemoglobin A1c Lab Results  Component Value Date   HGBA1C 6.0 (A) 10/26/2022   HGBA1C 6.0 10/26/2022   HGBA1C 6.0 10/26/2022   HGBA1C 6.0 10/26/2022   IMPRESSION AND PLAN:  #1 hypertension, doing better on amlodipine 5 mg a day. At this point we will continue this dosing and continue to monitor blood pressure at home.  If consistently greater than 135/85 then he will call or return.  #2 UTI.  History of Pseudomonas urine colonization. UA today with 3+ LEU, trace blood. Sent for c/s. Start ciprofloxacin 500 mg twice daily x 5 days.  An After Visit Summary was printed and given to the patient.  FOLLOW UP: Return in about 3 months (around 02/10/2023) for routine chronic illness f/u.  Signed:  Crissie Sickles, MD           11/12/2022

## 2022-11-13 DIAGNOSIS — R42 Dizziness and giddiness: Secondary | ICD-10-CM | POA: Diagnosis not present

## 2022-11-13 DIAGNOSIS — I4891 Unspecified atrial fibrillation: Secondary | ICD-10-CM | POA: Diagnosis not present

## 2022-11-13 DIAGNOSIS — W19XXXA Unspecified fall, initial encounter: Secondary | ICD-10-CM | POA: Diagnosis not present

## 2022-11-14 ENCOUNTER — Ambulatory Visit: Payer: Medicare Other | Admitting: Family Medicine

## 2022-11-14 LAB — URINE CULTURE
MICRO NUMBER:: 14552233
SPECIMEN QUALITY:: ADEQUATE

## 2022-11-20 ENCOUNTER — Telehealth: Payer: Self-pay | Admitting: Family Medicine

## 2022-11-20 NOTE — Telephone Encounter (Signed)
Pt reports that he has been constipated the past couple days. He is asking if Miralax is safe to take due to him having kidney disease. He reports that the package says consult his Dr before use. Please give the patient a call to give the ok or not.

## 2022-11-20 NOTE — Telephone Encounter (Signed)
Please advise 

## 2022-11-21 NOTE — Telephone Encounter (Signed)
Patient called back to check on status of his request of taking MiraLAX. Patient advised of recommendations per Dr. Anitra Lauth okay to take MiraLAX.

## 2022-11-21 NOTE — Telephone Encounter (Signed)
Yes, MiraLAX is fine.

## 2022-11-21 NOTE — Telephone Encounter (Signed)
Pt advised of recommendations.  

## 2022-11-28 ENCOUNTER — Ambulatory Visit (INDEPENDENT_AMBULATORY_CARE_PROVIDER_SITE_OTHER): Payer: Medicare Other

## 2022-11-28 VITALS — Wt 161.0 lb

## 2022-11-28 DIAGNOSIS — Z0000 Encounter for general adult medical examination without abnormal findings: Secondary | ICD-10-CM

## 2022-11-28 NOTE — Patient Instructions (Signed)
Antonio Serrano , Thank you for taking time to come for your Medicare Wellness Visit. I appreciate your ongoing commitment to your health goals. Please review the following plan we discussed and let me know if I can assist you in the future.   These are the goals we discussed:  Goals      Patient Stated     Maintain current health by staying active. Increase water intake     Patient Stated     Walk better      Patient Stated     Get better with walking         This is a list of the screening recommended for you and due dates:  Health Maintenance  Topic Date Due   COVID-19 Vaccine (4 - 2023-24 season) 06/01/2022   Eye exam for diabetics  11/29/2022*   Zoster (Shingles) Vaccine (1 of 2) 01/25/2023*   Colon Cancer Screening  11/29/2023*   Hemoglobin A1C  04/26/2023   Yearly kidney health urinalysis for diabetes  07/20/2023   Complete foot exam   07/20/2023   Yearly kidney function blood test for diabetes  10/27/2023   Medicare Annual Wellness Visit  11/29/2023   Pneumonia Vaccine  Completed   Flu Shot  Completed   Hepatitis C Screening: USPSTF Recommendation to screen - Ages 18-79 yo.  Completed   HPV Vaccine  Aged Out   DTaP/Tdap/Td vaccine  Discontinued  *Topic was postponed. The date shown is not the original due date.    Advanced directives: Please bring a copy of your health care power of attorney and living will to the office at your convenience.  Conditions/risks identified: get back to walking better   Next appointment: Follow up in one year for your annual wellness visit.   Preventive Care 32 Years and Older, Male  Preventive care refers to lifestyle choices and visits with your health care provider that can promote health and wellness. What does preventive care include? A yearly physical exam. This is also called an annual well check. Dental exams once or twice a year. Routine eye exams. Ask your health care provider how often you should have your eyes  checked. Personal lifestyle choices, including: Daily care of your teeth and gums. Regular physical activity. Eating a healthy diet. Avoiding tobacco and drug use. Limiting alcohol use. Practicing safe sex. Taking low doses of aspirin every day. Taking vitamin and mineral supplements as recommended by your health care provider. What happens during an annual well check? The services and screenings done by your health care provider during your annual well check will depend on your age, overall health, lifestyle risk factors, and family history of disease. Counseling  Your health care provider may ask you questions about your: Alcohol use. Tobacco use. Drug use. Emotional well-being. Home and relationship well-being. Sexual activity. Eating habits. History of falls. Memory and ability to understand (cognition). Work and work Statistician. Screening  You may have the following tests or measurements: Height, weight, and BMI. Blood pressure. Lipid and cholesterol levels. These may be checked every 5 years, or more frequently if you are over 85 years old. Skin check. Lung cancer screening. You may have this screening every year starting at age 54 if you have a 30-pack-year history of smoking and currently smoke or have quit within the past 15 years. Fecal occult blood test (FOBT) of the stool. You may have this test every year starting at age 69. Flexible sigmoidoscopy or colonoscopy. You may have a sigmoidoscopy  every 5 years or a colonoscopy every 10 years starting at age 40. Prostate cancer screening. Recommendations will vary depending on your family history and other risks. Hepatitis C blood test. Hepatitis B blood test. Sexually transmitted disease (STD) testing. Diabetes screening. This is done by checking your blood sugar (glucose) after you have not eaten for a while (fasting). You may have this done every 1-3 years. Abdominal aortic aneurysm (AAA) screening. You may need this  if you are a current or former smoker. Osteoporosis. You may be screened starting at age 53 if you are at high risk. Talk with your health care provider about your test results, treatment options, and if necessary, the need for more tests. Vaccines  Your health care provider may recommend certain vaccines, such as: Influenza vaccine. This is recommended every year. Tetanus, diphtheria, and acellular pertussis (Tdap, Td) vaccine. You may need a Td booster every 10 years. Zoster vaccine. You may need this after age 30. Pneumococcal 13-valent conjugate (PCV13) vaccine. One dose is recommended after age 37. Pneumococcal polysaccharide (PPSV23) vaccine. One dose is recommended after age 42. Talk to your health care provider about which screenings and vaccines you need and how often you need them. This information is not intended to replace advice given to you by your health care provider. Make sure you discuss any questions you have with your health care provider. Document Released: 10/14/2015 Document Revised: 06/06/2016 Document Reviewed: 07/19/2015 Elsevier Interactive Patient Education  2017 [employer] Prevention in the Home Falls can cause injuries. They can happen to people of all ages. There are many things you can do to make your home safe and to help prevent falls. What can I do on the outside of my home? Regularly fix the edges of walkways and driveways and fix any cracks. Remove anything that might make you trip as you walk through a door, such as a raised step or threshold. Trim any bushes or trees on the path to your home. Use bright outdoor lighting. Clear any walking paths of anything that might make someone trip, such as rocks or tools. Regularly check to see if handrails are loose or broken. Make sure that both sides of any steps have handrails. Any raised decks and porches should have guardrails on the edges. Have any leaves, snow, or ice cleared regularly. Use sand  or salt on walking paths during winter. Clean up any spills in your garage right away. This includes oil or grease spills. What can I do in the bathroom? Use night lights. Install grab bars by the toilet and in the tub and shower. Do not use towel bars as grab bars. Use non-skid mats or decals in the tub or shower. If you need to sit down in the shower, use a plastic, non-slip stool. Keep the floor dry. Clean up any water that spills on the floor as soon as it happens. Remove soap buildup in the tub or shower regularly. Attach bath mats securely with double-sided non-slip rug tape. Do not have throw rugs and other things on the floor that can make you trip. What can I do in the bedroom? Use night lights. Make sure that you have a light by your bed that is easy to reach. Do not use any sheets or blankets that are too big for your bed. They should not hang down onto the floor. Have a firm chair that has side arms. You can use this for support while you get dressed. Do not have  throw rugs and other things on the floor that can make you trip. What can I do in the kitchen? Clean up any spills right away. Avoid walking on wet floors. Keep items that you use a lot in easy-to-reach places. If you need to reach something above you, use a strong step stool that has a grab bar. Keep electrical cords out of the way. Do not use floor polish or wax that makes floors slippery. If you must use wax, use non-skid floor wax. Do not have throw rugs and other things on the floor that can make you trip. What can I do with my stairs? Do not leave any items on the stairs. Make sure that there are handrails on both sides of the stairs and use them. Fix handrails that are broken or loose. Make sure that handrails are as long as the stairways. Check any carpeting to make sure that it is firmly attached to the stairs. Fix any carpet that is loose or worn. Avoid having throw rugs at the top or bottom of the stairs.  If you do have throw rugs, attach them to the floor with carpet tape. Make sure that you have a light switch at the top of the stairs and the bottom of the stairs. If you do not have them, ask someone to add them for you. What else can I do to help prevent falls? Wear shoes that: Do not have high heels. Have rubber bottoms. Are comfortable and fit you well. Are closed at the toe. Do not wear sandals. If you use a stepladder: Make sure that it is fully opened. Do not climb a closed stepladder. Make sure that both sides of the stepladder are locked into place. Ask someone to hold it for you, if possible. Clearly mark and make sure that you can see: Any grab bars or handrails. First and last steps. Where the edge of each step is. Use tools that help you move around (mobility aids) if they are needed. These include: Canes. Walkers. Scooters. Crutches. Turn on the lights when you go into a dark area. Replace any light bulbs as soon as they burn out. Set up your furniture so you have a clear path. Avoid moving your furniture around. If any of your floors are uneven, fix them. If there are any pets around you, be aware of where they are. Review your medicines with your doctor. Some medicines can make you feel dizzy. This can increase your chance of falling. Ask your doctor what other things that you can do to help prevent falls. This information is not intended to replace advice given to you by your health care provider. Make sure you discuss any questions you have with your health care provider. Document Released: 07/14/2009 Document Revised: 02/23/2016 Document Reviewed: 10/22/2014 Elsevier Interactive Patient Education  2017 [employer].

## 2022-11-28 NOTE — Progress Notes (Signed)
I connected with  Antonio Serrano on 11/28/22 by a audio enabled telemedicine application and verified that I am speaking with the correct person using two identifiers.  Patient Location: Home  Provider Location: Home Office  I discussed the limitations of evaluation and management by telemedicine. The patient expressed understanding and agreed to proceed.   Subjective:   Antonio Serrano is a 77 y.o. male who presents for Medicare Annual/Subsequent preventive examination.  Review of Systems     Cardiac Risk Factors include: advanced age (>40mn, >>29women);hypertension;dyslipidemia;male gender;diabetes mellitus;smoking/ tobacco exposure     Objective:    Today's Vitals   11/28/22 1027  Weight: 161 lb (73 kg)   Body mass index is 27.64 kg/m.     11/28/2022   10:32 AM 02/05/2022    2:22 PM 01/04/2022   10:20 AM 11/22/2021   10:20 AM 04/23/2021   12:55 PM 11/16/2020    3:02 PM 09/04/2020   12:20 PM  Advanced Directives  Does Patient Have a Medical Advance Directive? Yes Yes No Yes Yes Yes Yes  Type of AParamedicof ACamp Barrett[place] will Healthcare Power of [place] will   Does patient want to make changes to medical advance directive?     No - Patient declined    Copy of [place] Chart? No - copy requested   No - copy requested  No - copy requested     Current Medications (verified) Outpatient Encounter Medications as of 11/28/2022  Medication Sig   amLODipine (NORVASC) 5 MG tablet Take 1 tablet (5 mg total) by mouth daily.   clopidogrel (PLAVIX) 75 MG tablet Take 1 tablet (75 mg total) by mouth daily.   Docusate Calcium (STOOL SOFTENER PO) Take 1 tablet by mouth daily as needed (constipation).   ipratropium (ATROVENT) 0.03 % nasal spray USE 2 SPRAY(S) IN EACH NOSTRIL EVERY 12 HOURS   mirabegron ER (MYRBETRIQ) 50 MG TB24 tablet Take 1 tablet (50  mg total) by mouth daily.   OVER THE COUNTER MEDICATION Take 2 tablets by mouth daily. Elderberry, zinc, and vitamin C combo medication OTC.   pantoprazole (PROTONIX) 40 MG tablet Take 1 tablet by mouth once daily   pioglitazone (ACTOS) 15 MG tablet 1 TABLET BY MOUTH ONCE DAILY   No facility-administered encounter medications on file as of 11/28/2022.    Allergies (verified) Bactrim [sulfamethoxazole-trimethoprim]   History: Past Medical History:  Diagnosis Date   Atherosclerosis of coronary artery    On chest CT->calcif in LAD and RCA   Chronic low back pain    MR 11/2021 showed L4-5 foraminal stenosis--> plan per Ortho was to do L4-5 injection   Chronic prostatitis    Very mild elevation of PSA (>4), referred to [facility] where PSA repeat was 1.0 on 06/16/13.  Annual PSA repeat is all that is needed now per urologist.  Most recent 07/2015 was 0.45.   Chronic renal insufficiency, stage III (moderate) (HCC)    Baseline GFR as of 2019= 50s.     [place]   [place]   Diabetes mellitus with complication ([place] Dx[place] fall 2011   HTN (hypertension)    Renal/aortic doppler u/s normal 06/2010   Hypercalcemia    Hyperlipidemia 2016   Statin intolerant--myalgias.  Pt refuses any further trial of statin as of 2018.   Nonspecific abnormal electrocardiogram (ECG) (EKG) Fall 2011   TWI in inferior  leads: myocardial perfusion scan neg and echo normal 07/2010   Pseudomonas aeruginosa colonization    12/2021 urine clx   TIA (transient ischemic attack)    12/2021- CT angio head/neck no high grade stenosis.  MR showed old lacunar infarcts corona radiata, basal ganglia, thalamus.  Echo normal.   Tobacco dependence    UTI (lower urinary tract infection)    Feb 2012 (klebsiella--dx at [facility]); 03/2013 e coli.    Past Surgical History:  Procedure Laterality Date   CARDIOVASCULAR STRESS TEST  07/01/2010   Myocardial perfusion scan neg/low risk.   CATARACT EXTRACTION  2007, 2008    Bilateral ([facility] on [address].   COLONOSCOPY  12/20/04;12/2014   2016 no polyps: recall 5 yrs due to [place] of colon ca   ESOPHAGOGASTRODUODENOSCOPY  11/29/2004   esoph stricture/dilation   TOTAL HIP ARTHROPLASTY Right 04/23/2021   Procedure: TOTAL HIP ARTHROPLASTY ANTERIOR APPROACH;  Surgeon: Meredith Pel, MD;  Location: [facility];  Service: Orthopedics;  Laterality: Right;  Hana, C-arm, Depuy   TRANSTHORACIC ECHOCARDIOGRAM  07/01/2010   2011 EF =>55%; LA mildly dilated; trace MR/TR.  12/2021 EF 60-65%, DD indeterm, valves nl.   Family History  Problem Relation Age of Onset   Dementia Mother    Pneumonia Father        died in 78's of pneumonia, was otherwise healthy   Cancer Sister        colon cancer.  Died age 75   Diabetes Paternal Aunt    Diabetes Sister    Colon cancer Neg Hx    Social History   Socioeconomic History   Marital status: Married    Spouse name: Not on file   Number of children: Not on file   Years of education: Not on file   Highest education level: Not on file  Occupational History   Not on file  Tobacco Use   Smoking status: Every Day    Packs/day: 0.25    Years: 50.00    Total pack years: 12.50    Types: Cigarettes   Smokeless tobacco: Never  Vaping Use   Vaping Use: Never used  Substance and Sexual Activity   Alcohol use: Not Currently    Alcohol/week: 0.0 standard drinks of alcohol    Comment: rare alcohol   Drug use: No   Sexual activity: Not on file  Other Topics Concern   Not on file  Social History Narrative   Married, 4 grown children, 6 grandchildren.   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   Lives in [place].   25 pack-yr tobacco hx--current as of 05/2014.   Drinks about a six pack per week.  Enjoys golf--six handicap at one point.  No formal exercise.   Walks a lot for his job.         Social Determinants of Health   Financial Resource Strain:  Low Risk  (11/28/2022)   Overall Financial Resource Strain (CARDIA)    Difficulty of Paying Living Expenses: Not hard at all  Food Insecurity: No Food Insecurity (11/28/2022)   Hunger Vital Sign    Worried About Running Out of Food in the Last Year: Never true    Ran Out of Food in the Last Year: Never true  Transportation Needs: No Transportation Needs (11/28/2022)   PRAPARE - Hydrologist (Medical): No    Lack of Transportation (Non-Medical): No  Physical Activity:  Insufficiently Active (11/28/2022)   Exercise Vital Sign    Days of Exercise per Week: 5 days    Minutes of Exercise per Session: 20 min  Stress: No Stress Concern Present (11/28/2022)   [place]    Feeling of Stress : Not at all  Social Connections: Moderately Isolated (11/28/2022)   Social Connection and Isolation Panel [NHANES]    Frequency of Communication with Friends and Family: More than three times a week    Frequency of Social Gatherings with Friends and Family: Once a week    Attends Religious Services: Never    Marine scientist or Organizations: No    Attends Music therapist: Never    Marital Status: Married    Tobacco Counseling Ready to quit: Not Answered Counseling given: Not Answered   Clinical Intake:  Pre-visit preparation completed: Yes  Pain : No/denies pain     BMI - recorded: 27.64 Nutritional Status: BMI 25 -29 Overweight Nutritional Risks: None Diabetes: Yes CBG done?: No CBG resulted in Enter/ Edit results?: No Did pt. bring in CBG monitor from home?: No  How often do you need to have someone help you when you read instructions, pamphlets, or other written materials from your doctor or pharmacy?: 1 - Never  Diabetic?Nutrition Risk Assessment:  Has the patient had any N/V/D within the last 2 months?  No  Does the patient have any non-healing wounds?  No  Has the  patient had any unintentional weight loss or weight gain?  No   Diabetes:  Is the patient diabetic?  Yes  If diabetic, was a CBG obtained today?  No  Did the patient bring in their glucometer from home?  No  How often do you monitor your CBG's? Every other day.   Financial Strains and Diabetes Management:very other day   Are you having any financial strains with the device, your supplies or your medication? No .  Does the patient want to be seen by Chronic Care Management for management of their diabetes?  No  Would the patient like to be referred to a Nutritionist or for Diabetic Management?  No   Diabetic Exams:  Diabetic Eye Exam: Overdue for diabetic eye exam. Pt has been advised about the importance in completing this exam. Patient advised to call and schedule an eye exam. Diabetic Foot Exam: Completed 07/19/22   Interpreter Needed?: No  Information entered by :: Antonio Rakes, LPN   Activities of Daily Living    11/28/2022   10:33 AM  In your present state of health, do you have any difficulty performing the following activities:  Hearing? 0  Vision? 0  Difficulty concentrating or making decisions? 0  Walking or climbing stairs? 1  Comment at times difficult  Dressing or bathing? 0  Doing errands, shopping? 0  Preparing Food and eating ? N  Using the Toilet? N  In the past six months, have you accidently leaked urine? N  Do you have problems with loss of bowel control? N  Managing your Medications? N  Managing your Finances? N  Housekeeping or managing your Housekeeping? N    Patient Care Team: Tammi Sou, MD as PCP - General (Family Medicine) Chuck Hint (Dentistry) Macarthur Critchley, [place] as Consulting Physician (Optometry) Corliss Parish, MD as Consulting Physician (Nephrology) [facility] [place], Dani Gobble, MD as Consulting Physician (Cardiology)  Indicate any recent Medical Services you may have received from other than [facility] providers  in  the past year (date may be approximate).     Assessment:   This is a routine wellness examination for Antonio Serrano.  Hearing/Vision screen Hearing Screening - Comments:: Pt denies any hearing issues  Vision Screening - Comments:: Pt follows up with dr walker for annual eye exams   Dietary issues and exercise activities discussed: Current Exercise Habits: Home exercise routine, Type of exercise: walking, Time (Minutes): 20, Frequency (Times/Week): 5, Weekly Exercise (Minutes/Week): 100   Goals Addressed             This Visit's Progress    Patient Stated       Get better with walking        Depression Screen    11/28/2022   10:31 AM 11/12/2022   11:29 AM 01/11/2022   11:43 AM 11/22/2021   10:19 AM 09/13/2021   12:15 PM 11/16/2020    3:05 PM 09/16/2020    9:49 AM  PHQ 2/9 Scores  PHQ - 2 Score 0 0 3 0 0 0 0    Fall Risk    11/28/2022   10:33 AM 11/12/2022   11:29 AM 01/11/2022   11:39 AM 11/22/2021   10:21 AM 09/13/2021   12:15 PM  Fall Risk   Falls in the past year? 1 0 '1 1 1  '$ Number falls in past yr: 1 0 '1 1 1  '$ Injury with Fall? 0 0 '1 1 1  '$ Comment    hip injury   Risk for fall due to : Impaired vision;History of fall(s);Impaired balance/gait;Impaired mobility  History of fall(s);Impaired balance/gait;Impaired mobility Impaired vision;Impaired balance/gait;Impaired mobility   Follow up Falls prevention discussed Falls evaluation completed Falls evaluation completed Falls prevention discussed Falls evaluation completed    FALL RISK PREVENTION PERTAINING TO THE HOME:  Any stairs in or around the home? Yes  If so, are there any without handrails? No  Home free of loose throw rugs in walkways, pet beds, electrical cords, etc? Yes  Adequate lighting in your home to reduce risk of falls? Yes   ASSISTIVE DEVICES UTILIZED TO PREVENT FALLS:  Life alert? Yes  Use of a cane, walker or w/c? Yes  Grab bars in the bathroom? Yes  Shower chair or bench in shower? Yes  Elevated  toilet seat or a handicapped toilet? No   TIMED UP AND GO:  Was the test performed? No   Cognitive Function:    01/10/2018   10:09 AM  MMSE - Mini Mental State Exam  Orientation to time 5  Orientation to Place 5  Registration 3  Attention/ Calculation 5  Recall 3  Language- name 2 objects 2  Language- repeat 1  Language- follow 3 step command 3  Language- read & follow direction 1  Write a sentence 1  Copy design 1  Total score 30        11/28/2022   10:34 AM 11/22/2021   10:23 AM  6CIT Screen  What Year? 0 points 0 points  What month? 0 points 0 points  What time? 0 points 0 points  Count back from 20 0 points 0 points  Months in reverse 4 points 0 points  Repeat phrase 2 points 6 points  Total Score 6 points 6 points    Immunizations Immunization History  Administered Date(s) Administered   Fluad Quad(high Dose 65+) 06/18/2019, 09/16/2020, 07/13/2021, 07/19/2022   Influenza, High Dose Seasonal PF 06/21/2017, 06/20/2018   Influenza,inj,Quad PF,6+ Mos 05/31/2014, 06/27/2015   Influenza-Unspecified 09/04/2016   PFIZER(Purple  Top)SARS-COV-2 Vaccination 11/09/2019, 12/03/2019, 08/04/2020   Pneumococcal Conjugate-13 05/18/2014   Pneumococcal Polysaccharide-23 07/29/2015      Flu Vaccine status: Up to date  Pneumococcal vaccine status: Up to date  Covid-19 vaccine status: Completed vaccines  Qualifies for Shingles Vaccine? Yes   Zostavax completed No   Shingrix Completed?: No.    Education has been provided regarding the importance of this vaccine. Patient has been advised to call insurance company to determine out of pocket expense if they have not yet received this vaccine. Advised may also receive vaccine at local pharmacy or Health Dept. Verbalized acceptance and understanding.  Screening Tests Health Maintenance  Topic Date Due   COVID-19 Vaccine (4 - 2023-24 season) 06/01/2022   OPHTHALMOLOGY EXAM  11/29/2022 (Originally 08/16/2018)   Zoster Vaccines-  Shingrix (1 of 2) 01/25/2023 (Originally 10/16/1997)   COLONOSCOPY (Pts 45-40yr Insurance coverage will need to be confirmed)  11/29/2023 (Originally 01/20/2020)   HEMOGLOBIN A1C  04/26/2023   Diabetic kidney evaluation - Urine ACR  07/20/2023   FOOT EXAM  07/20/2023   Diabetic kidney evaluation - eGFR measurement  10/27/2023   Medicare Annual Wellness (AWV)  11/29/2023   Pneumonia Vaccine 75 Years old  Completed   INFLUENZA VACCINE  Completed   Hepatitis C Screening  Completed   HPV VACCINES  Aged Out   DTaP/Tdap/Td  Discontinued    Health Maintenance  Health Maintenance Due  Topic Date Due   COVID-19 Vaccine (4 - 2023-24 season) 06/01/2022    Colorectal cancer screening: No longer required.   Lung Cancer Screening: (Low Dose CT Chest recommended if Age 75-80years, 30 pack-year currently smoking OR have quit w/in 15years.) does qualify.   Lung Cancer Screening Referral: not scheduled at this time   Additional Screening:  Hepatitis C Screening: Completed 03/22/17  Vision Screening: Recommended annual ophthalmology exams for early detection of glaucoma and other disorders of the eye. Is the patient up to date with their annual eye exam?  no Who is the provider or what is the name of the office in which the patient attends annual eye exams? Dr WGilford Rile If pt is not established with a provider, would they like to be referred to a provider to establish care? No .   Dental Screening: Recommended annual dental exams for proper oral hygiene  Community Resource Referral / Chronic Care Management: CRR required this visit?  No   CCM required this visit?  No      Plan:     I have personally reviewed and noted the following in the patient's chart:   Medical and social history Use of alcohol, tobacco or illicit drugs  Current medications and supplements including opioid prescriptions. Patient is not currently taking opioid prescriptions. Functional ability and  status Nutritional status Physical activity Advanced directives List of other physicians Hospitalizations, surgeries, and ER visits in previous 12 months Vitals Screenings to include cognitive, depression, and falls Referrals and appointments  In addition, I have reviewed and discussed with patient certain preventive protocols, quality metrics, and best practice recommendations. A written personalized care plan for preventive services as well as general preventive health recommendations were provided to patient.     TWillette Brace LPN   [id number]  Nurse Notes: none

## 2022-11-29 ENCOUNTER — Other Ambulatory Visit: Payer: Self-pay | Admitting: Family Medicine

## 2022-11-30 NOTE — Telephone Encounter (Signed)
Medication d/c 04/18/22, please advise if refill is appropriate

## 2022-11-30 NOTE — Telephone Encounter (Signed)
OK. I sent in rx

## 2023-01-16 ENCOUNTER — Other Ambulatory Visit: Payer: Self-pay

## 2023-01-16 ENCOUNTER — Encounter (HOSPITAL_COMMUNITY): Payer: Self-pay

## 2023-01-16 ENCOUNTER — Emergency Department (HOSPITAL_COMMUNITY): Payer: Medicare Other

## 2023-01-16 ENCOUNTER — Emergency Department (HOSPITAL_COMMUNITY)
Admission: EM | Admit: 2023-01-16 | Discharge: 2023-01-16 | Disposition: A | Discharge status: Home / Self Care | Payer: Medicare Other | Attending: Emergency Medicine | Admitting: Emergency Medicine

## 2023-01-16 DIAGNOSIS — M25569 Pain in unspecified knee: Secondary | ICD-10-CM | POA: Diagnosis not present

## 2023-01-16 DIAGNOSIS — M85862 Other specified disorders of bone density and structure, left lower leg: Secondary | ICD-10-CM | POA: Diagnosis not present

## 2023-01-16 DIAGNOSIS — M25561 Pain in right knee: Secondary | ICD-10-CM | POA: Insufficient documentation

## 2023-01-16 DIAGNOSIS — M8589 Other specified disorders of bone density and structure, multiple sites: Secondary | ICD-10-CM | POA: Diagnosis not present

## 2023-01-16 DIAGNOSIS — I10 Essential (primary) hypertension: Secondary | ICD-10-CM | POA: Diagnosis not present

## 2023-01-16 DIAGNOSIS — M1611 Unilateral primary osteoarthritis, right hip: Secondary | ICD-10-CM | POA: Diagnosis not present

## 2023-01-16 DIAGNOSIS — Z79899 Other long term (current) drug therapy: Secondary | ICD-10-CM | POA: Diagnosis not present

## 2023-01-16 DIAGNOSIS — G8929 Other chronic pain: Secondary | ICD-10-CM

## 2023-01-16 DIAGNOSIS — Z8673 Personal history of transient ischemic attack (TIA), and cerebral infarction without residual deficits: Secondary | ICD-10-CM | POA: Insufficient documentation

## 2023-01-16 DIAGNOSIS — Z96641 Presence of right artificial hip joint: Secondary | ICD-10-CM | POA: Diagnosis not present

## 2023-01-16 NOTE — Discharge Instructions (Signed)
Thank you for letting us take care of you today.  We did not see any acute issues on your XR. I do  believe you would benefit from orthopedics follow-up. I have provided our orthopedic on call. Please call his office and schedule a follow up appointment within the next week. You may also follow up with your primary orthopedic if you so desire.  You may take over the counter medications to help with pain and swelling. Please see attached information for RICE therapy which you may benefit from. Continue to use your walker at home to help with walking until you follow up with your PCP or orthopedic.  For any new or worsening symptoms or injury, please return to nearest emergency department for re-evaluation.

## 2023-01-16 NOTE — ED Triage Notes (Signed)
BIB EMS from home for chronic right knee pain that has been ongoing for over a year. Pt reports he cannot bear weight to right knee. Denies new injury. Denies recent falls. States increase in pain started after hip surgery

## 2023-01-16 NOTE — ED Provider Notes (Signed)
[place] EMERGENCY DEPARTMENT AT [facility] Provider Note   CSN: [id number] Arrival date & time: 01/16/23  1311     History  Chief Complaint  Patient presents with   Knee Pain    Right    Antonio Serrano is a 77 y.o. male with past medical history hyperlipidemia, hypertension, TIA who presents to the ED for evaluation of right knee pain.  Patient told nursing staff that he has had chronic right knee pain for over a year.  Patient tells me that his right knee pain is newer and over the last few days he has had increasing difficulty with bearing weight.  He denies any fall or injury to the knee.  He is status post right hip replacement approximately 10 months ago.  He tells me that he was evaluated by his orthopedic recently and had a negative hip x-ray.  He denies associated fever, chills, nausea, vomiting, chest pain, shortness of breath, calf pain, leg swelling, paresthesias, or other symptoms today.  At baseline, patient ambulates with a walker.  He has been using this and a knee brace and he states both of these things do help relieve his pain.      Home Medications Prior to Admission medications   Medication Sig Start Date End Date Taking? Authorizing Provider  traZODone (DESYREL) 50 MG tablet TAKE 1 TABLET BY MOUTH EVERY DAY AT BEDTIME AS NEEDED FOR SLEEP 11/30/22   [name], [name], MD  amLODipine (NORVASC) 5 MG tablet Take 1 tablet (5 mg total) by mouth daily. 11/12/22   [name], [name], MD  Docusate Calcium (STOOL SOFTENER PO) Take 1 tablet by mouth daily as needed (constipation).    [provider]  ipratropium (ATROVENT) 0.03 % nasal spray USE 2 SPRAY(S) IN EACH NOSTRIL EVERY 12 HOURS 09/13/21   [name], [name], MD  mirabegron ER (MYRBETRIQ) 50 MG TB24 tablet Take 1 tablet (50 mg total) by mouth daily. 03/15/22   [name], [name], MD  OVER THE COUNTER MEDICATION Take 2 tablets by mouth daily. Elderberry, zinc, and vitamin C combo medication OTC.     [provider]  pantoprazole (PROTONIX) 40 MG tablet Take 1 tablet by mouth once daily 10/22/22   [name], [name], MD  pioglitazone (ACTOS) 15 MG tablet 1 TABLET BY MOUTH ONCE DAILY 01/11/22   [name], [name], MD      Allergies    Bactrim [sulfamethoxazole-trimethoprim]    Review of Systems   Review of Systems  All other systems reviewed and are negative.   Physical Exam Updated Vital Signs BP (!) 144/76 (BP Location: Right Arm)   Pulse 82   Temp 97.7 F (36.5 C) (Oral)   Resp 18   Ht 5\' 4"  (1.626 m)   Wt 73.5 kg   SpO2 99%   BMI 27.81 kg/m  Physical Exam Vitals and nursing note reviewed.  Constitutional:      General: He is not in acute distress.    Appearance: Normal appearance. He is not ill-appearing or toxic-appearing.  HENT:     Head: Normocephalic and atraumatic.     Mouth/Throat:     Mouth: Mucous membranes are moist.  Eyes:     Conjunctiva/sclera: Conjunctivae normal.  Cardiovascular:     Rate and Rhythm: Normal rate and regular rhythm.     Heart sounds: No murmur heard. Pulmonary:     Effort: Pulmonary effort is normal.     Breath sounds: Normal breath sounds.  Abdominal:  General: Abdomen is flat.     Palpations: Abdomen is soft.  Musculoskeletal:     Cervical back: Normal range of motion and neck supple.     Right lower leg: No edema.     Left lower leg: No edema.     Comments: Mild tenderness over right patella and distal thigh, joint stable, no overlying increased warmth, erythema, wounds, or other skin changes, 2+ DP and PT pulse, normal sensation, soft compartments, no calf or posterior knee tenderness, 5/5 strength to bilateral LE which is equal, no tenderness over bilateral hips which appear stable  Skin:    General: Skin is warm and dry.     Capillary Refill: Capillary refill takes less than 2 seconds.  Neurological:     Mental Status: He is alert. Mental status is at baseline.  Psychiatric:        Behavior: Behavior normal.      ED Results / Procedures / Treatments   Labs (all labs ordered are listed, but only abnormal results are displayed) Labs Reviewed - No data to display  EKG None  Radiology DG Hip Unilat W or Wo Pelvis 2-3 Views Right  Result Date: 01/16/2023 CLINICAL DATA:  Pain EXAM: DG HIP (WITH OR WITHOUT PELVIS) 3V RIGHT COMPARISON:  12/06/2021. FINDINGS: Osseous structures are osteopenic. Status post right total hip arthroplasty. Pelvic ring intact. Chronic posttraumatic changes of the symphysis pubis. Degenerative changes of the left hip. Lumbosacral degenerative changes. IMPRESSION: Osteopenia.  Degenerative changes.  No acute osseous abnormalities. Electronically Signed   By: Layla Maw M.D.   On: 01/16/2023 14:40   DG Knee Complete 4 Views Right  Result Date: 01/16/2023 CLINICAL DATA:  Pain EXAM: RIGHT KNEE - COMPLETE 4 VIEW COMPARISON:  None Available. FINDINGS: Osseous structures are osteopenic. No acute fracture, dislocation or subluxation. Superior patellar spur. No effusion. IMPRESSION: Osteopenia.  No acute osseous abnormalities. Electronically Signed   By: Layla Maw M.D.   On: 01/16/2023 14:38    Procedures Procedures    Medications Ordered in ED Medications - No data to display  ED Course/ Medical Decision Making/ A&P                             Medical Decision Making Amount and/or Complexity of Data Reviewed Radiology: ordered. Decision-making details documented in ED Course.   Medical Decision Making:   Antonio Serrano is a 77 y.o. male who presented to the ED today with knee pain detailed above.    Patient's presentation is complicated by their history of previous hip replacement, chronic pain, advanced age.  Complete initial physical exam performed, notably the patient was in no acute distress. Neurologically intact. No overlying skin changes over joints. Stable joints. Soft compartments. Good strong pulses. No deformity.     Reviewed and confirmed nursing  documentation for past medical history, family history, social history.    Initial Assessment:   With the patient's presentation of right knee pain, differential diagnosis includes but is not limited to fracture, dislocation, septic joint, strain, sprain, compartment syndrome, hip fracture, hip dislocation, CVA/TIA.  This is most consistent with an acute complicated illness  Initial Plan:  XR to evaluate for bony pathology Objective evaluation as below reviewed   Initial Study Results:   Radiology:  All images reviewed independently. Agree with radiology report at this time.   DG Hip Unilat W or Wo Pelvis 2-3 Views Right  Result Date: 01/16/2023 CLINICAL DATA:  Pain EXAM: DG HIP (WITH OR WITHOUT PELVIS) 3V RIGHT COMPARISON:  12/06/2021. FINDINGS: Osseous structures are osteopenic. Status post right total hip arthroplasty. Pelvic ring intact. Chronic posttraumatic changes of the symphysis pubis. Degenerative changes of the left hip. Lumbosacral degenerative changes. IMPRESSION: Osteopenia.  Degenerative changes.  No acute osseous abnormalities. Electronically Signed   By: Layla Maw M.D.   On: 01/16/2023 14:40   DG Knee Complete 4 Views Right  Result Date: 01/16/2023 CLINICAL DATA:  Pain EXAM: RIGHT KNEE - COMPLETE 4 VIEW COMPARISON:  None Available. FINDINGS: Osseous structures are osteopenic. No acute fracture, dislocation or subluxation. Superior patellar spur. No effusion. IMPRESSION: Osteopenia.  No acute osseous abnormalities. Electronically Signed   By: Layla Maw M.D.   On: 01/16/2023 14:38      Final Assessment and Plan:   77 year old male presenting to the ED for evaluation of right knee pain.  On my interview, patient states that this pain is relatively new but does tell nursing staff that he has had chronic issues with his right knee.  He is status post previous right hip replacement within the last year.  On exam, joints have right lower extremity appear stable.   Nonfocal neurologic exam with equal strength to the bilateral lower extremities and patient neurovascularly intact distally.  Soft compartments.  No overlying skin changes.  Do not suspect septic joint.  Do not suspect acute neurological emergency with reassuring neurologic exam.  Patient hypertensive but otherwise vital signs within normal limits.  Low suspicion for fracture or dislocation given lack of traumatic injury but x-rays ordered for further evaluation with questionable new or worsening symptoms.  X-ray showing osteopenia but no significant acute findings.  Discussed findings with patient.  Patient is closely followed by orthopedics with his recent hip replacement.  Suspect osteopenia age-related that may be contributing to current symptoms.  He would like to follow-up with this orthopedic but I did provide the orthopedic on-call as well.  Patient does express some frustration regarding unremarkable x-rays today and no cause identified of his knee pain.  Discussed with patient that he will likely need other imaging in the outpatient setting but no emergent cause of his symptoms identified today.  Patient has walker available at home which he uses to assist with ambulation.  Offered additional bracing of the knee and/or crutches but patient declined.  Patient given strict ED return precautions, all questions answered, and stable for discharge.   Clinical Impression:  1. Chronic pain of right knee   2. Osteopenia of multiple sites      Discharge           Final Clinical Impression(s) / ED Diagnoses Final diagnoses:  Chronic pain of right knee  Osteopenia of multiple sites    Rx / DC Orders ED Discharge Orders     None         Tonette Lederer, PA-C 01/16/23 1552    Gwyneth Sprout, MD 01/23/23 0004

## 2023-01-21 ENCOUNTER — Telehealth: Payer: Self-pay | Admitting: *Deleted

## 2023-01-21 NOTE — Telephone Encounter (Signed)
     Patient  visit on 01/16/2023  at [facility] long ed  was for treatment   Have you been able to follow up with your primary care physician?Said he was good   The patient was able to obtain any needed medicine or equipment.  Are there diet recommendations that you are having difficulty following?  Patient expresses understanding of discharge instructions and education provided has no other needs at this time.   Yehuda Mao Greenauer -[facility] [place], Population [facility] [phone] [address] [place] , [place] [address] Email : [email or web address]

## 2023-01-25 ENCOUNTER — Other Ambulatory Visit: Payer: Self-pay | Admitting: Family Medicine

## 2023-02-08 NOTE — Patient Instructions (Signed)

## 2023-02-11 ENCOUNTER — Ambulatory Visit (INDEPENDENT_AMBULATORY_CARE_PROVIDER_SITE_OTHER): Payer: Medicare Other | Admitting: Family Medicine

## 2023-02-11 ENCOUNTER — Encounter: Payer: Self-pay | Admitting: Family Medicine

## 2023-02-11 VITALS — BP 124/80 | HR 79 | Wt 165.0 lb

## 2023-02-11 DIAGNOSIS — N1831 Chronic kidney disease, stage 3a: Secondary | ICD-10-CM | POA: Diagnosis not present

## 2023-02-11 DIAGNOSIS — E1121 Type 2 diabetes mellitus with diabetic nephropathy: Secondary | ICD-10-CM | POA: Diagnosis not present

## 2023-02-11 DIAGNOSIS — M7061 Trochanteric bursitis, right hip: Secondary | ICD-10-CM

## 2023-02-11 DIAGNOSIS — Z7984 Long term (current) use of oral hypoglycemic drugs: Secondary | ICD-10-CM

## 2023-02-11 DIAGNOSIS — I10 Essential (primary) hypertension: Secondary | ICD-10-CM | POA: Diagnosis not present

## 2023-02-11 LAB — POCT GLYCOSYLATED HEMOGLOBIN (HGB A1C)
HbA1c POC (<> result, manual entry): 6.2 % (ref 4.0–5.6)
HbA1c, POC (controlled diabetic range): 6.2 % (ref 0.0–7.0)
HbA1c, POC (prediabetic range): 6.2 % (ref 5.7–6.4)
Hemoglobin A1C: 6.2 % — AB (ref 4.0–5.6)

## 2023-02-11 MED ORDER — TRAZODONE HCL 50 MG PO TABS: ORAL_TABLET | ORAL | 0 refills | Status: DC

## 2023-02-11 MED ORDER — MIRABEGRON ER 50 MG PO TB24: 50.0000 mg | ORAL_TABLET | Freq: Every day | ORAL | 1 refills | Status: DC

## 2023-02-11 MED ORDER — PIOGLITAZONE HCL 15 MG PO TABS: ORAL_TABLET | ORAL | 1 refills | Status: DC

## 2023-02-11 NOTE — Progress Notes (Signed)
OFFICE VISIT  02/11/2023  CC:  Chief Complaint  Patient presents with   Follow-up    3 month follow up. No other questions or concerns    Patient is a 77 y.o. male who presents for follow-up diabetes, hypertension, and chronic renal insufficiency stage III.  INTERIM HX: Having some orthostatic dizziness lately so he cut his amlodipine back to 2.5 mg daily. This has helped and he plans on going back up to the 5 mg dose tomorrow. Home blood pressures have ranged anywhere from 120s to 150s systolic over 50s to 80s diastolic. No home glucose monitoring.  He describes right lateral hip pain.  He fractured his right hip in July 2022 and got total hip arthroplasty at that time. He was fine for a couple months afterwards but then he "twisted "his hip and heard a pop. Imaging and exams after that have revealed no problem with his prosthetic joint. I felt like at 1 point he had trochanteric bursitis.  The pain is the worst when he tries to lay on that side at night.  Also hurts when he gets up and tries to walk. As result of his altered gait his right knee has begun to give him some problems. No back pain, gluteal pain, or groin pain.  No paresthesias.  ROS as above, plus--> no fevers, no CP, no SOB, no wheezing, no cough, no dizziness, no HAs, no rashes, no melena/hematochezia.  No polyuria or polydipsia.    No focal weakness, paresthesias, or tremors.  No acute vision or hearing abnormalities.  No dysuria or unusual/new urinary urgency or frequency.  No recent changes in lower legs. No n/v/d or abd pain.  No palpitations.    Past Medical History:  Diagnosis Date   Atherosclerosis of coronary artery    On chest CT->calcif in LAD and RCA   Chronic low back pain    MR 11/2021 showed L4-5 foraminal stenosis--> plan per Ortho was to do L4-5 injection   Chronic prostatitis    Very mild elevation of PSA (>4), referred to [facility] where PSA repeat was 1.0 on 06/16/13.  Annual PSA repeat is all that is  needed now per urologist.  Most recent 07/2015 was 0.45.   Chronic renal insufficiency, stage III (moderate) (HCC)    Baseline GFR as of 2019= 50s.     Closed right hip fracture (HCC) 03/2021   THA   Diabetes mellitus with complication (HCC) Dx'd fall 2011   HTN (hypertension)    Renal/aortic doppler u/s normal 06/2010   Hypercalcemia    Hyperlipidemia 2016   Statin intolerant--myalgias.  Pt refuses any further trial of statin as of 2018.   Nonspecific abnormal electrocardiogram (ECG) (EKG) Fall 2011   TWI in inferior leads: myocardial perfusion scan neg and echo normal 07/2010   Pseudomonas aeruginosa colonization    12/2021 urine clx   TIA (transient ischemic attack)    12/2021- CT angio head/neck no high grade stenosis.  MR showed old lacunar infarcts corona radiata, basal ganglia, thalamus.  Echo normal.   Tobacco dependence    UTI (lower urinary tract infection)    Feb 2012 (klebsiella--dx at [facility]); 03/2013 e coli.     Past Surgical History:  Procedure Laterality Date   CARDIOVASCULAR STRESS TEST  07/01/2010   Myocardial perfusion scan neg/low risk.   CATARACT EXTRACTION  2007, 2008   Bilateral ([facility] on [address].   COLONOSCOPY  12/20/04;12/2014   2016 no polyps: recall 5 yrs due to [facility]  of colon ca   ESOPHAGOGASTRODUODENOSCOPY  11/29/2004   esoph stricture/dilation   TOTAL HIP ARTHROPLASTY Right 04/23/2021   Procedure: TOTAL HIP ARTHROPLASTY ANTERIOR APPROACH;  Surgeon: Cammy Copa, MD;  Location: [facility] ORS;  Service: Orthopedics;  Laterality: Right;  Hana, C-arm, Depuy   TRANSTHORACIC ECHOCARDIOGRAM  07/01/2010   2011 EF =>55%; LA mildly dilated; trace MR/TR.  12/2021 EF 60-65%, DD indeterm, valves nl.    Outpatient Medications Prior to Visit  Medication Sig Dispense Refill   amLODipine (NORVASC) 5 MG tablet Take 1 tablet (5 mg total) by mouth daily. 90 tablet 3   clopidogrel (PLAVIX) 75 MG tablet Take 1 tablet by mouth once daily 90 tablet 0    Docusate Calcium (STOOL SOFTENER PO) Take 1 tablet by mouth daily as needed (constipation).     ipratropium (ATROVENT) 0.03 % nasal spray USE 2 SPRAY(S) IN EACH NOSTRIL EVERY 12 HOURS 30 mL 11   pantoprazole (PROTONIX) 40 MG tablet Take 1 tablet by mouth once daily 90 tablet 0   OVER THE COUNTER MEDICATION Take 2 tablets by mouth daily. Elderberry, zinc, and vitamin C combo medication OTC. (Patient not taking: Reported on 02/11/2023)     mirabegron ER (MYRBETRIQ) 50 MG TB24 tablet Take 1 tablet (50 mg total) by mouth daily. 90 tablet 1   pioglitazone (ACTOS) 15 MG tablet 1 TABLET BY MOUTH ONCE DAILY 90 tablet 1   traZODone (DESYREL) 50 MG tablet TAKE 1 TABLET BY MOUTH EVERY DAY AT BEDTIME AS NEEDED FOR SLEEP 90 tablet 0   No facility-administered medications prior to visit.    Allergies  Allergen Reactions   Bactrim [Sulfamethoxazole-Trimethoprim] Nausea Only    Review of Systems As per HPI  PE:    02/11/2023    2:13 PM 01/16/2023    1:21 PM 01/16/2023    1:20 PM  Vitals with BMI  Height   5\' 4"   Weight 165 lbs  162 lbs  BMI   27.79  Systolic 152 144   Diastolic 85 76   Pulse 79  82     Physical Exam  Gen: Alert, well appearing.  Patient is oriented to person, place, time, and situation. Sitting in WC. CV: RRR, no m/r/g.   LUNGS: CTA bilat, nonlabored resps, good aeration in all lung fields. EXT: no clubbing or cyanosis.  no edema.  Right hip with significant tenderness palpation of the greater trochanter.  Mild pain with resisted abduction and external rotation of the hip. Internal rotation is intact and without pain.  Flexion is full and extension is full at the hip without pain.   LABS:  Last CBC Lab Results  Component Value Date   WBC 6.8 02/05/2022   HGB 13.8 02/05/2022   HCT 40.3 02/05/2022   MCV 90.2 02/05/2022   MCH 30.9 02/05/2022   RDW 13.9 02/05/2022   PLT 236 02/05/2022   Last metabolic panel Lab Results  Component Value Date   GLUCOSE 96  10/26/2022   NA 133 (L) 10/26/2022   K 4.2 10/26/2022   CL 97 10/26/2022   CO2 26 10/26/2022   BUN 8 10/26/2022   CREATININE 1.22 10/26/2022   GFRNONAA 47 (L) 02/05/2022   CALCIUM 9.2 10/26/2022   PHOS 3.7 04/24/2021   PROT 7.0 10/26/2022   ALBUMIN 4.2 10/26/2022   LABGLOB 2.3 02/16/2019   AGRATIO 1.9 02/16/2019   BILITOT 0.5 10/26/2022   ALKPHOS 122 (H) 10/26/2022   AST 12 10/26/2022   ALT 9 10/26/2022  ANIONGAP 13 02/05/2022   Last lipids Lab Results  Component Value Date   CHOL 135 01/05/2022   HDL 46 01/05/2022   LDLCALC 70 01/05/2022   TRIG 95 01/05/2022   CHOLHDL 2.9 01/05/2022   Last hemoglobin A1c Lab Results  Component Value Date   HGBA1C 6.2 (A) 02/11/2023   HGBA1C 6.2 02/11/2023   HGBA1C 6.2 02/11/2023   HGBA1C 6.2 02/11/2023   Last thyroid functions Lab Results  Component Value Date   TSH 2.256 01/05/2022   IMPRESSION AND PLAN:  #1 diabetes with nephropathy.  Well-controlled.  Continue Actos 15 mg a day. POC Hba1c today is 6.2%.  #2 hypertension, with history of orthostatic hypotension. Continue 2.5 to 5 mg of amlodipine daily. Electrolytes and creatinine today.  3.  Chronic renal insufficiency stage IIIa. Avoid NSAIDs.  Hydrate well. Electrolytes and creatinine checked today.  4.  Right lateral hip pain. Strongly suspect trochanteric bursitis.  I have low suspicion that this pain has anything to do with his hip prosthesis or other intra-articular problem. If this does not significantly improve by the time I see him next we will do greater troch bursa steroid injection.  An After Visit Summary was printed and given to the patient.  FOLLOW UP: Return in about 3 months (around 05/14/2023) for annual CPE (fasting). Cpe 3 mo Signed:  Santiago Bumpers, MD           02/11/2023

## 2023-02-12 LAB — COMPREHENSIVE METABOLIC PANEL
ALT: 9 U/L (ref 0–53)
AST: 14 U/L (ref 0–37)
Albumin: 4.3 g/dL (ref 3.5–5.2)
Alkaline Phosphatase: 113 U/L (ref 39–117)
BUN: 8 mg/dL (ref 6–23)
CO2: 26 mEq/L (ref 19–32)
Calcium: 9.7 mg/dL (ref 8.4–10.5)
Chloride: 93 mEq/L — ABNORMAL LOW (ref 96–112)
Creatinine, Ser: 1.41 mg/dL (ref 0.40–1.50)
GFR: 48.81 mL/min — ABNORMAL LOW (ref >60.00)
Glucose, Bld: 101 mg/dL — ABNORMAL HIGH (ref 70–99)
Potassium: 4.9 mEq/L (ref 3.5–5.1)
Sodium: 129 mEq/L — ABNORMAL LOW (ref 135–145)
Total Bilirubin: 0.6 mg/dL (ref 0.2–1.2)
Total Protein: 7.2 g/dL (ref 6.0–8.3)

## 2023-03-18 ENCOUNTER — Other Ambulatory Visit: Payer: Self-pay

## 2023-03-18 ENCOUNTER — Emergency Department (HOSPITAL_COMMUNITY): Payer: Medicare Other

## 2023-03-18 ENCOUNTER — Encounter (HOSPITAL_COMMUNITY): Payer: Self-pay

## 2023-03-18 ENCOUNTER — Emergency Department (HOSPITAL_COMMUNITY)
Admission: EM | Admit: 2023-03-18 | Discharge: 2023-03-18 | Disposition: A | Discharge status: Home / Self Care | Payer: Medicare Other | Attending: Emergency Medicine | Admitting: Emergency Medicine

## 2023-03-18 DIAGNOSIS — R531 Weakness: Secondary | ICD-10-CM | POA: Diagnosis not present

## 2023-03-18 DIAGNOSIS — M25561 Pain in right knee: Secondary | ICD-10-CM | POA: Diagnosis not present

## 2023-03-18 DIAGNOSIS — Z79899 Other long term (current) drug therapy: Secondary | ICD-10-CM | POA: Insufficient documentation

## 2023-03-18 DIAGNOSIS — R42 Dizziness and giddiness: Secondary | ICD-10-CM | POA: Insufficient documentation

## 2023-03-18 DIAGNOSIS — Z96641 Presence of right artificial hip joint: Secondary | ICD-10-CM | POA: Diagnosis not present

## 2023-03-18 DIAGNOSIS — W19XXXA Unspecified fall, initial encounter: Secondary | ICD-10-CM

## 2023-03-18 DIAGNOSIS — N183 Chronic kidney disease, stage 3 unspecified: Secondary | ICD-10-CM | POA: Insufficient documentation

## 2023-03-18 DIAGNOSIS — I129 Hypertensive chronic kidney disease with stage 1 through stage 4 chronic kidney disease, or unspecified chronic kidney disease: Secondary | ICD-10-CM | POA: Diagnosis not present

## 2023-03-18 DIAGNOSIS — E1122 Type 2 diabetes mellitus with diabetic chronic kidney disease: Secondary | ICD-10-CM | POA: Diagnosis not present

## 2023-03-18 DIAGNOSIS — E871 Hypo-osmolality and hyponatremia: Secondary | ICD-10-CM | POA: Diagnosis not present

## 2023-03-18 DIAGNOSIS — H73891 Other specified disorders of tympanic membrane, right ear: Secondary | ICD-10-CM | POA: Diagnosis not present

## 2023-03-18 DIAGNOSIS — Z7902 Long term (current) use of antithrombotics/antiplatelets: Secondary | ICD-10-CM | POA: Diagnosis not present

## 2023-03-18 DIAGNOSIS — M25551 Pain in right hip: Secondary | ICD-10-CM | POA: Diagnosis not present

## 2023-03-18 DIAGNOSIS — H6690 Otitis media, unspecified, unspecified ear: Secondary | ICD-10-CM

## 2023-03-18 DIAGNOSIS — M11261 Other chondrocalcinosis, right knee: Secondary | ICD-10-CM | POA: Diagnosis not present

## 2023-03-18 DIAGNOSIS — I10 Essential (primary) hypertension: Secondary | ICD-10-CM | POA: Diagnosis not present

## 2023-03-18 DIAGNOSIS — Z471 Aftercare following joint replacement surgery: Secondary | ICD-10-CM | POA: Diagnosis not present

## 2023-03-18 LAB — CBC WITH DIFFERENTIAL/PLATELET
Abs Immature Granulocytes: 0.03 10*3/uL (ref 0.00–0.07)
Basophils Absolute: 0.1 10*3/uL (ref 0.0–0.1)
Basophils Relative: 1 %
Eosinophils Absolute: 0 10*3/uL (ref 0.0–0.5)
Eosinophils Relative: 0 %
HCT: 45 % (ref 39.0–52.0)
Hemoglobin: 15.7 g/dL (ref 13.0–17.0)
Immature Granulocytes: 0 %
Lymphocytes Relative: 18 %
Lymphs Abs: 1.5 10*3/uL (ref 0.7–4.0)
MCH: 29.4 pg (ref 26.0–34.0)
MCHC: 34.9 g/dL (ref 30.0–36.0)
MCV: 84.3 fL (ref 80.0–100.0)
Monocytes Absolute: 0.6 10*3/uL (ref 0.1–1.0)
Monocytes Relative: 7 %
Neutro Abs: 5.8 10*3/uL (ref 1.7–7.7)
Neutrophils Relative %: 74 %
Platelets: 256 10*3/uL (ref 150–400)
RBC: 5.34 MIL/uL (ref 4.22–5.81)
RDW: 13.7 % (ref 11.5–15.5)
WBC: 8 10*3/uL (ref 4.0–10.5)
nRBC: 0 % (ref 0.0–0.2)

## 2023-03-18 LAB — URINALYSIS, ROUTINE W REFLEX MICROSCOPIC
Bilirubin Urine: NEGATIVE
Glucose, UA: 150 mg/dL — AB
Hgb urine dipstick: NEGATIVE
Ketones, ur: NEGATIVE mg/dL
Leukocytes,Ua: NEGATIVE
Nitrite: NEGATIVE
Protein, ur: NEGATIVE mg/dL
Specific Gravity, Urine: 1.011 (ref 1.005–1.030)
pH: 6 (ref 5.0–8.0)

## 2023-03-18 LAB — COMPREHENSIVE METABOLIC PANEL
ALT: 11 U/L (ref 0–44)
AST: 23 U/L (ref 15–41)
Albumin: 3.5 g/dL (ref 3.5–5.0)
Alkaline Phosphatase: 109 U/L (ref 38–126)
Anion gap: 9 (ref 5–15)
BUN: 8 mg/dL (ref 8–23)
CO2: 24 mmol/L (ref 22–32)
Calcium: 8.9 mg/dL (ref 8.9–10.3)
Chloride: 95 mmol/L — ABNORMAL LOW (ref 98–111)
Creatinine, Ser: 1.55 mg/dL — ABNORMAL HIGH (ref 0.61–1.24)
GFR, Estimated: 46 mL/min — ABNORMAL LOW (ref >60)
Glucose, Bld: 134 mg/dL — ABNORMAL HIGH (ref 70–99)
Potassium: 3.8 mmol/L (ref 3.5–5.1)
Sodium: 128 mmol/L — ABNORMAL LOW (ref 135–145)
Total Bilirubin: 0.8 mg/dL (ref 0.3–1.2)
Total Protein: 6.4 g/dL — ABNORMAL LOW (ref 6.5–8.1)

## 2023-03-18 LAB — TROPONIN I (HIGH SENSITIVITY)
Troponin I (High Sensitivity): 18 ng/L — ABNORMAL HIGH (ref <18)
Troponin I (High Sensitivity): 19 ng/L — ABNORMAL HIGH (ref <18)

## 2023-03-18 LAB — CBG MONITORING, ED: Glucose-Capillary: 127 mg/dL — ABNORMAL HIGH (ref 70–99)

## 2023-03-18 MED ORDER — GADOBUTROL 1 MMOL/ML IV SOLN
7.5000 mL | Freq: Once | INTRAVENOUS | Status: AC | PRN
  Administered 2023-03-18: 7.5 mL via INTRAVENOUS

## 2023-03-18 MED ORDER — AMOXICILLIN-POT CLAVULANATE 875-125 MG PO TABS: 1.0000 | ORAL_TABLET | Freq: Two times a day (BID) | ORAL | 0 refills | Status: DC

## 2023-03-18 MED ORDER — OXYCODONE-ACETAMINOPHEN 5-325 MG PO TABS
1.0000 | ORAL_TABLET | Freq: Once | ORAL | Status: DC
  Filled 2023-03-18: qty 1

## 2023-03-18 NOTE — ED Triage Notes (Signed)
Pt BIBEMS from home w cc of dizziness and worsening chronic right hip/knee weakness. Pt states last night around 11pm his right leg gave out and he lowered self to ground causing him to form abrasion to forehead but denies hitting head or LOC. Pt had ems come to house after incident occurred but declined coming into hospital last night for eval. Pt stated today he continued feeling dizzy (has hx of vertigo but does not take medication for it) and requested to come into ED for MRI on his right hip/knee as it continues to give him difficulty. Pt is on plavix. VSS/NAD.

## 2023-03-18 NOTE — Discharge Instructions (Addendum)
Please follow-up with your primary care doctor, make sure you take the medication as prescribed.  It may cause loose stools so make sure you are taking food with this.  Return to the ER if you have any other falls, become dizzy, or feel unsafe at home.  Your MRIs were reassuring today and showed no acute strokes.

## 2023-03-18 NOTE — ED Notes (Signed)
Pt transported to MRI 

## 2023-03-18 NOTE — ED Provider Notes (Signed)
[place] EMERGENCY DEPARTMENT AT [facility] Provider Note   CSN: [id number] Arrival date & time: 03/18/23  1239     History  Chief Complaint  Patient presents with   Dizziness    Antonio Serrano is a 77 y.o. male past medical history of diabetes, hypertension, right hip fracture status post total hip arthroplasty in 2022, TIA presents today for evaluation of dizziness.  Patient had a fall in the restroom yesterday.  He has a bruise on the top of his head.  Denies LOC.  Patient had EMS come to his house after the incident occurred but declined coming into the hospital last night for evaluation.  He denies room spinning sensation, states he felt lightheadedness and loss of balance.  He denies any chest pain or shortness of breath when this happened.  Patient is on Plavix for TIA.  States that his right leg has been feeling progressively weaker.  He denies fever, chest pain, shortness of breath, bowel changes, urinary symptoms, rash.   Dizziness     Past Medical History:  Diagnosis Date   Atherosclerosis of coronary artery    On chest CT->calcif in LAD and RCA   Chronic low back pain    MR 11/2021 showed L4-5 foraminal stenosis--> plan per Ortho was to do L4-5 injection   Chronic prostatitis    Very mild elevation of PSA (>4), referred to Urol where PSA repeat was 1.0 on 06/16/13.  Annual PSA repeat is all that is needed now per urologist.  Most recent 07/2015 was 0.45.   Chronic renal insufficiency, stage III (moderate) (HCC)    Baseline GFR as of 2019= 50s.     Closed right hip fracture (HCC) 03/2021   THA   Diabetes mellitus with complication (HCC) Dx'd fall 2011   HTN (hypertension)    Renal/aortic doppler u/s normal 06/2010   Hypercalcemia    Hyperlipidemia 2016   Statin intolerant--myalgias.  Pt refuses any further trial of statin as of 2018.   Nonspecific abnormal electrocardiogram (ECG) (EKG) Fall 2011   TWI in inferior leads: myocardial perfusion scan neg and  echo normal 07/2010   Pseudomonas aeruginosa colonization    12/2021 urine clx   TIA (transient ischemic attack)    12/2021- CT angio head/neck no high grade stenosis.  MR showed old lacunar infarcts corona radiata, basal ganglia, thalamus.  Echo normal.   Tobacco dependence    UTI (lower urinary tract infection)    Feb 2012 (klebsiella--dx at [facility]); 03/2013 e coli.    Past Surgical History:  Procedure Laterality Date   CARDIOVASCULAR STRESS TEST  07/01/2010   Myocardial perfusion scan neg/low risk.   CATARACT EXTRACTION  2007, 2008   [facility] [address].   COLONOSCOPY  12/20/04;12/2014   2016 no polyps: recall 5 yrs due to FH of colon ca   ESOPHAGOGASTRODUODENOSCOPY  11/29/2004   esoph stricture/dilation   TOTAL HIP ARTHROPLASTY Right 04/23/2021   Procedure: TOTAL HIP ARTHROPLASTY ANTERIOR APPROACH;  Surgeon: Cammy Copa, MD;  Location: [facility];  Service: Orthopedics;  Laterality: Right;  Hana, C-arm, Depuy   TRANSTHORACIC ECHOCARDIOGRAM  07/01/2010   2011 EF =>55%; LA mildly dilated; trace MR/TR.  12/2021 EF 60-65%, DD indeterm, valves nl.     Home Medications Prior to Admission medications   Medication Sig Start Date End Date Taking? Authorizing Provider  amLODipine (NORVASC) 5 MG tablet Take 1 tablet (5 mg total) by mouth daily. 11/12/22   [name], [name], MD  clopidogrel (PLAVIX) 75 MG tablet Take 1 tablet by mouth once daily 01/25/23   [name], [name], MD  Docusate Calcium (STOOL SOFTENER PO) Take 1 tablet by mouth daily as needed (constipation).    [provider]  ipratropium (ATROVENT) 0.03 % nasal spray USE 2 SPRAY(S) IN EACH NOSTRIL EVERY 12 HOURS 09/13/21   [name], [name], MD  mirabegron ER (MYRBETRIQ) 50 MG TB24 tablet Take 1 tablet (50 mg total) by mouth daily. 02/11/23   [name], [name], MD  OVER THE COUNTER MEDICATION Take 2 tablets by mouth daily. Elderberry, zinc, and vitamin C combo medication OTC. Patient not  taking: Reported on 02/11/2023    [provider]  pantoprazole (PROTONIX) 40 MG tablet Take 1 tablet by mouth once daily 01/25/23   [name], [name], MD  pioglitazone (ACTOS) 15 MG tablet 1 TABLET BY MOUTH ONCE DAILY 02/11/23   [name], [name], MD  traZODone (DESYREL) 50 MG tablet TAKE 1 TABLET BY MOUTH EVERY DAY AT BEDTIME AS NEEDED FOR SLEEP 02/11/23   [name], [name], MD      Allergies    Bactrim [sulfamethoxazole-trimethoprim]    Review of Systems   Review of Systems  Neurological:  Positive for dizziness.    Physical Exam Updated Vital Signs BP (!) 153/94 (BP Location: Left Arm)   Pulse 91   Temp 98.3 F (36.8 C) (Oral)   Resp 16   Ht 5\' 5"  (1.651 m)   Wt 71.8 kg   SpO2 94%   BMI 26.34 kg/m  Physical Exam Vitals and nursing note reviewed.  Constitutional:      Appearance: Normal appearance.  HENT:     Head: Normocephalic and atraumatic.     Mouth/Throat:     Mouth: Mucous membranes are moist.  Eyes:     General: No scleral icterus. Cardiovascular:     Rate and Rhythm: Normal rate and regular rhythm.     Pulses: Normal pulses.     Heart sounds: Normal heart sounds.  Pulmonary:     Effort: Pulmonary effort is normal.     Breath sounds: Normal breath sounds.  Abdominal:     General: Abdomen is flat.     Palpations: Abdomen is soft.     Tenderness: There is no abdominal tenderness.  Musculoskeletal:        General: No deformity.  Skin:    General: Skin is warm.     Findings: No rash.  Neurological:     General: No focal deficit present.     Mental Status: He is alert.     Comments: Cranial nerves II through XII intact. Intact sensation to light touch in all 4 extremities. 5/5 strength in all 4 extremities. Intact finger-to-nose and heel-to-shin of all 4 extremities. No visual field cuts. No neglect noted. No aphasia noted.   Psychiatric:        Mood and Affect: Mood normal.     ED Results / Procedures / Treatments   Labs (all labs ordered are  listed, but only abnormal results are displayed) Labs Reviewed - No data to display  EKG None  Radiology No results found.  Procedures Procedures    Medications Ordered in ED Medications - No data to display  ED Course/ Medical Decision Making/ A&P                             Medical Decision Making Amount and/or Complexity of Data Reviewed  Labs: ordered. Radiology: ordered.  Risk Prescription drug management.   This patient presents to the ED for dizziness, fall, this involves an extensive number of treatment options, and is a complaint that carries with a high risk of complications and morbidity.  The differential diagnosis includes CVA, ICH, multiple sclerosis, acoustic neuroma, carotid artery dissection, BPPV, labyrinthitis, Mnire's, ACS/MI.  This is not an exhaustive list.  Lab tests: I ordered and personally interpreted labs.  The pertinent results include: WBC unremarkable. Hbg unremarkable. Platelets unremarkable. Electrolytes significant for hyponatremia 128.. BUN, creatinine 1.55.   Imaging studies: I ordered imaging studies. I personally reviewed, interpreted imaging and agree with the radiologist's interpretations. The results include: X-ray bilateral hip, right knee, CT head, MRI brain ordered and pending.  Problem list/ ED course/ Critical interventions/ Medical management: HPI: See above Vital signs within normal range and stable throughout visit. Laboratory/imaging studies significant for: See above. On physical examination, patient is afebrile and appears in no acute distress.  There was no focal neurological deficit on my examination.  Patient has decreased strength of the right leg but likely secondary to chronic pain and hip replacement.  EKG without any acute ischemic changes, patient has no chest pain however cannot rule out ACS/MI with pending troponin.  CBC with no leukocytosis or anemia.  Creatinine is around baseline, unlikely AKI.  Given Percocet  for pain.  CT head, MRI brain ordered to rule out CVA/ICH. I have reviewed the patient home medicines and have made adjustments as needed.  Cardiac monitoring/EKG: The patient was maintained on a cardiac monitor.  I personally reviewed and interpreted the cardiac monitor which showed an underlying rhythm of: sinus rhythm.  Additional history obtained: External records from outside source obtained and reviewed including: Chart review including previous notes, labs, imaging.  Consultations obtained:  Disposition Patient's care signed out at shift change to [facility], PA-C with pending advanced imaging. This chart was dictated using voice recognition software.  Despite best efforts to proofread,  errors can occur which can change the documentation meaning.          Final Clinical Impression(s) / ED Diagnoses Final diagnoses:  Dizziness    Rx / DC Orders ED Discharge Orders     None         Jeanelle Malling, [place] 03/18/23 1543    Arby Barrette, MD 03/19/23 [phone]

## 2023-03-18 NOTE — ED Provider Notes (Signed)
Received handoff from Janus Molder, pending CT head/MRI brain/xrays s/p fall on Plavix yesterday. Feels RLE is weaker for the last few days, however he often feels like RLE is weaker.    Physical Exam  BP (!) 153/94 (BP Location: Left Arm)   Pulse 91   Temp 98.3 F (36.8 C) (Oral)   Resp 16   Ht 5\' 5"  (1.651 m)   Wt 71.8 kg   SpO2 94%   BMI 26.34 kg/m   Physical Exam Vitals and nursing note reviewed.  Constitutional:      General: He is not in acute distress.    Appearance: He is well-developed.  HENT:     Head: Normocephalic and atraumatic.     Right Ear: Tympanic membrane is scarred, erythematous and bulging.     Left Ear: Tympanic membrane normal.  Eyes:     Conjunctiva/sclera: Conjunctivae normal.  Cardiovascular:     Rate and Rhythm: Normal rate and regular rhythm.     Heart sounds: No murmur heard. Pulmonary:     Effort: Pulmonary effort is normal. No respiratory distress.     Breath sounds: Normal breath sounds.  Abdominal:     Palpations: Abdomen is soft.     Tenderness: There is no abdominal tenderness.  Musculoskeletal:        General: No swelling.     Cervical back: Neck supple.  Skin:    General: Skin is warm and dry.     Capillary Refill: Capillary refill takes less than 2 seconds.  Neurological:     Mental Status: He is alert.  Psychiatric:        Mood and Affect: Mood normal.     Procedures  Procedures  ED Course / MDM    Medical Decision Making Patient is 77 year old male, he handoff pending CT head, MRI brain, x-rays after fall on Plavix yesterday.  Also reports for right lower extremity is weaker for the last few days.  However he often feels right lower extremity is weaker.  He has good strength on bilateral eval, as well as no evidence of any kind of swelling.  MRI brain was negative, did not show any kind of acute infarct, however he does have some remote infarcts, and chronic disease.  His x-rays were negative as well he was able to ambulate,  but states he felt a little bit dizzy, but he often feels dizzy when walking.  He also reports that he has had some right earache pain, I evaluated his ear and it shows signs of possible scarring, versus otitis media, we will treat him with Augmentin for this.  He was offered physical therapy for home, referral, and he declined he states he had multiple physical therapy exercises at home to practice, as well as he has family members at home to help him.  He states he feels very safe at home, we discussed the risk of him falling, and he voiced understanding but would like to discharge anyway.  Discharged home, with strict follow-up with primary care doctor.  Amount and/or Complexity of Data Reviewed Labs: ordered. Radiology: ordered.  Risk Prescription drug management.          Pete Pelt, [place] 03/18/23 2339    Terald Sleeper, MD 03/19/23 1435

## 2023-03-25 ENCOUNTER — Telehealth: Payer: Self-pay

## 2023-03-25 NOTE — Telephone Encounter (Signed)
Transition Care Management Unsuccessful Follow-up Telephone Call  Date of discharge and from where:  [facility]  Attempts:  1st Attempt  Reason for unsuccessful TCM follow-up call:  Left voice message   Lenard Forth [facility] Guide, [facility] [phone] [address] [place], [place], [place] [address] Phone: [phone] Email: [email or web address]

## 2023-03-26 ENCOUNTER — Telehealth: Payer: Self-pay

## 2023-03-26 NOTE — Telephone Encounter (Signed)
Transition Care Management Unsuccessful Follow-up Telephone Call  Date of discharge and from where:  [facility]  Attempts:  2nd Attempt  Reason for unsuccessful TCM follow-up call:  Left voice message   [facility] Guide, [facility] [phone] [address] [place], [place], [place] [address] Phone: [phone] Email: [email or web address]

## 2023-04-08 ENCOUNTER — Other Ambulatory Visit: Payer: Self-pay

## 2023-04-08 ENCOUNTER — Telehealth: Payer: Self-pay | Admitting: Family Medicine

## 2023-04-08 DIAGNOSIS — R42 Dizziness and giddiness: Secondary | ICD-10-CM

## 2023-04-08 DIAGNOSIS — R296 Repeated falls: Secondary | ICD-10-CM

## 2023-04-08 NOTE — Telephone Encounter (Signed)
Referral placed.

## 2023-04-08 NOTE — Telephone Encounter (Signed)
Please advise. Pt scheduled for CPE on 05/14/23

## 2023-04-08 NOTE — Telephone Encounter (Signed)
Pls refer to [facility] for dizziness and recurrent falls. If hip/leg pain is the main problem then I also encourage him to make an appt with me.

## 2023-04-08 NOTE — Telephone Encounter (Signed)
Patients daughter called Antonio Serrano to report that her father has been having frequent falls lately. He has been seen in the ED and everything seems to checkout fine with testing.  She reports that Antonio Serrano has mentioned things about his leg pain and movement. He has fallen about 4 times within the last month. They are asking for a referral to a neurologist and declined scheduling an appointment.

## 2023-04-09 NOTE — Telephone Encounter (Signed)
Patients daughter called back, and I provided her with information regarding referral update.

## 2023-04-15 ENCOUNTER — Telehealth: Payer: Self-pay | Admitting: Family Medicine

## 2023-04-15 NOTE — Patient Instructions (Signed)
It was very nice to see you today!   PLEASE NOTE:   If you had any lab tests please let us know if you have not heard back within a few days. You may see your results on MyChart before we have a chance to review them but we will give you a call once they are reviewed by us. If we ordered any referrals today, please let us know if you have not heard from their office within the next 2 weeks. You should receive a letter via MyChart confirming if the referral was approved and their office contact information to schedule.  

## 2023-04-15 NOTE — Telephone Encounter (Signed)
Jacquelynn Cree Johnnys wife called and reports that she is having a hard time with Tadhg being as his legs are very weak and she is asking for some advice. She would like a CMA to give her a call back. He is scheduled for the 17th.

## 2023-04-15 NOTE — Telephone Encounter (Addendum)
Spoke with wife, pt is unable to walk without wheelchair or walker. He is not eating and believes he needs PT.   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

## 2023-04-17 ENCOUNTER — Ambulatory Visit (INDEPENDENT_AMBULATORY_CARE_PROVIDER_SITE_OTHER): Payer: Medicare Other | Admitting: Family Medicine

## 2023-04-17 ENCOUNTER — Encounter: Payer: Self-pay | Admitting: Family Medicine

## 2023-04-17 VITALS — BP 117/79 | HR 106 | Wt 148.0 lb

## 2023-04-17 DIAGNOSIS — R5381 Other malaise: Secondary | ICD-10-CM | POA: Diagnosis not present

## 2023-04-17 DIAGNOSIS — R29898 Other symptoms and signs involving the musculoskeletal system: Secondary | ICD-10-CM

## 2023-04-17 DIAGNOSIS — G959 Disease of spinal cord, unspecified: Secondary | ICD-10-CM

## 2023-04-17 DIAGNOSIS — M79604 Pain in right leg: Secondary | ICD-10-CM

## 2023-04-17 DIAGNOSIS — R531 Weakness: Secondary | ICD-10-CM

## 2023-04-17 NOTE — Progress Notes (Signed)
OFFICE VISIT  04/17/2023  CC:  Chief Complaint  Patient presents with   Referral    Discuss for neurologist. Been have a lot of frequent falls.    Patient is a 77 y.o. male who presents accompanied by his wife and daughter for and for discussion of right leg pain, recurrent falls, debilitation.  INTERIM HX: Antonio Serrano has had at least several weeks of progressive generalized weakness, pain in the right leg at various areas, weakness in the right leg, and recurrent falls. Currently the pain is in the right lower leg, more in the calf area.  No swelling in the legs. He finds it difficult to even stand up and walk with his walker. No headaches, no back pain, no numbness in the legs, no saddle anesthesia, no tremor. No appetite, poor p.o. intake.  He has lost 17 pounds in the last 2 months. He says he is drinking fluids well, though. He has chronic dizziness. I ordered referral to neurology on 04/08/2023 for dizziness and recurrent falls.  This has not been set up yet.  He went to [facility] emergency department on 03/18/2023 after a fall.  MRI of the brain was negative for any acute problem.  Plain films of the hips and right knee were negative for acute problem.  Past Medical History:  Diagnosis Date   Atherosclerosis of coronary artery    On chest CT->calcif in LAD and RCA   Chronic low back pain    MR 11/2021 showed L4-5 foraminal stenosis--> plan per Ortho was to do L4-5 injection   Chronic prostatitis    Very mild elevation of PSA (>4), referred to Urol where PSA repeat was 1.0 on 06/16/13.  Annual PSA repeat is all that is needed now per urologist.  Most recent 07/2015 was 0.45.   Chronic renal insufficiency, stage III (moderate) (HCC)    Baseline GFR as of 2019= 50s.     Closed right hip fracture (HCC) 03/2021   THA   Diabetes mellitus with complication (HCC) Dx'd fall 2011   HTN (hypertension)    Renal/aortic doppler u/s normal 06/2010   Hypercalcemia    Hyperlipidemia 2016    Statin intolerant--myalgias.  Pt refuses any further trial of statin as of 2018.   Nonspecific abnormal electrocardiogram (ECG) (EKG) Fall 2011   TWI in inferior leads: myocardial perfusion scan neg and echo normal 07/2010   Pseudomonas aeruginosa colonization    12/2021 urine clx   TIA (transient ischemic attack)    12/2021- CT angio head/neck no high grade stenosis.  MR showed old lacunar infarcts corona radiata, basal ganglia, thalamus.  Echo normal.   Tobacco dependence    UTI (lower urinary tract infection)    Feb 2012 (klebsiella--dx at [facility]); 03/2013 e coli.     Past Surgical History:  Procedure Laterality Date   CARDIOVASCULAR STRESS TEST  07/01/2010   Myocardial perfusion scan neg/low risk.   CATARACT EXTRACTION  2007, 2008   [facility] ([facility] [address].   COLONOSCOPY  12/20/04;12/2014   2016 no polyps: recall 5 yrs due to FH of colon ca   ESOPHAGOGASTRODUODENOSCOPY  11/29/2004   esoph stricture/dilation   TOTAL HIP ARTHROPLASTY Right 04/23/2021   Procedure: TOTAL HIP ARTHROPLASTY ANTERIOR APPROACH;  Surgeon: Cammy Copa, MD;  Location: [facility];  Service: Orthopedics;  Laterality: Right;  Hana, C-arm, Depuy   TRANSTHORACIC ECHOCARDIOGRAM  07/01/2010   2011 EF =>55%; LA mildly dilated; trace MR/TR.  12/2021 EF 60-65%, DD indeterm, valves nl.  Outpatient Medications Prior to Visit  Medication Sig Dispense Refill   amLODipine (NORVASC) 5 MG tablet Take 1 tablet (5 mg total) by mouth daily. 90 tablet 3   amoxicillin-clavulanate (AUGMENTIN) 875-125 MG tablet Take 1 tablet by mouth every 12 (twelve) hours. 14 tablet 0   clopidogrel (PLAVIX) 75 MG tablet Take 1 tablet by mouth once daily 90 tablet 0   Docusate Calcium (STOOL SOFTENER PO) Take 1 tablet by mouth daily as needed (constipation).     ipratropium (ATROVENT) 0.03 % nasal spray USE 2 SPRAY(S) IN EACH NOSTRIL EVERY 12 HOURS 30 mL 11   mirabegron ER (MYRBETRIQ) 50 MG TB24 tablet Take 1  tablet (50 mg total) by mouth daily. 90 tablet 1   pantoprazole (PROTONIX) 40 MG tablet Take 1 tablet by mouth once daily 90 tablet 0   pioglitazone (ACTOS) 15 MG tablet 1 TABLET BY MOUTH ONCE DAILY 90 tablet 1   traZODone (DESYREL) 50 MG tablet TAKE 1 TABLET BY MOUTH EVERY DAY AT BEDTIME AS NEEDED FOR SLEEP 90 tablet 0   OVER THE COUNTER MEDICATION Take 2 tablets by mouth daily. Elderberry, zinc, and vitamin C combo medication OTC. (Patient not taking: Reported on 02/11/2023)     No facility-administered medications prior to visit.    Allergies  Allergen Reactions   Bactrim [Sulfamethoxazole-Trimethoprim] Nausea Only    Review of Systems As per HPI  PE:    04/17/2023    1:41 PM 03/18/2023    4:34 PM 03/18/2023   12:55 PM  Vitals with BMI  Height   5\' 5"   Weight 148 lbs  158 lbs 5 oz  BMI   26.34  Systolic 117 145   Diastolic 79 85   Pulse 106 72      Physical Exam  General: Sitting in wheelchair in no distress.  He is alert. He does not appear acutely ill. He is oriented x 4. Neuro: CN 2-12 intact bilaterally, strength 4/5 in proximal and distal right lower extremity.  Upper extremities and left lower extremity 5 out of 5 proximal and distal.  No sensory deficits.  No tremor.   Upper extremity and lower extremity DTRs symmetric.  No pronator drift. No tenderness, swelling, or erythema of the lower extremities.  No edema.  LABS:  Last CBC Lab Results  Component Value Date   WBC 8.0 03/18/2023   HGB 15.7 03/18/2023   HCT 45.0 03/18/2023   MCV 84.3 03/18/2023   MCH 29.4 03/18/2023   RDW 13.7 03/18/2023   PLT 256 03/18/2023   Last metabolic panel Lab Results  Component Value Date   GLUCOSE 134 (H) 03/18/2023   NA 128 (L) 03/18/2023   K 3.8 03/18/2023   CL 95 (L) 03/18/2023   CO2 24 03/18/2023   BUN 8 03/18/2023   CREATININE 1.55 (H) 03/18/2023   GFRNONAA 46 (L) 03/18/2023   CALCIUM 8.9 03/18/2023   PHOS 3.7 04/24/2021   PROT 6.4 (L) 03/18/2023   ALBUMIN 3.5  03/18/2023   LABGLOB 2.3 02/16/2019   AGRATIO 1.9 02/16/2019   BILITOT 0.8 03/18/2023   ALKPHOS 109 03/18/2023   AST 23 03/18/2023   ALT 11 03/18/2023   ANIONGAP 9 03/18/2023   IMPRESSION AND PLAN:  Right leg pain and weakness, subacute. Generalized weakness and debilitation.  Recent MRI brain 1 month ago without acute abnormality. A neurology referral is in process. HH PT referral will be set up.  Concern for lumbar myelopathy. MR lumbar spine without contrast ordered today.  An After Visit Summary was printed and given to the patient.  FOLLOW UP: Return in about 2 weeks (around 05/01/2023) for f/u FTT, R leg pain, recurrent falls.  Signed:  Santiago Bumpers, MD           04/17/2023

## 2023-04-18 ENCOUNTER — Other Ambulatory Visit: Payer: Self-pay | Admitting: Family Medicine

## 2023-04-18 ENCOUNTER — Telehealth: Payer: Self-pay | Admitting: Family Medicine

## 2023-04-18 NOTE — Telephone Encounter (Signed)
Kristen with [facility] called and asked to speak with Sherri regarding PA for a MRI of the lumbar spine for [employer].  She ask that you give her a call back at [phone]

## 2023-04-19 ENCOUNTER — Inpatient Hospital Stay (HOSPITAL_COMMUNITY)
Admission: EM | Admit: 2023-04-19 | Discharge: 2023-04-23 | DRG: 690 | Disposition: A | Discharge status: Skilled Nursing Facility | Payer: Medicare Other | Attending: Internal Medicine | Admitting: Internal Medicine

## 2023-04-19 ENCOUNTER — Emergency Department (HOSPITAL_COMMUNITY): Payer: Medicare Other

## 2023-04-19 ENCOUNTER — Encounter (HOSPITAL_COMMUNITY): Payer: Self-pay

## 2023-04-19 ENCOUNTER — Inpatient Hospital Stay (HOSPITAL_COMMUNITY): Payer: Medicare Other

## 2023-04-19 ENCOUNTER — Other Ambulatory Visit: Payer: Self-pay

## 2023-04-19 DIAGNOSIS — I129 Hypertensive chronic kidney disease with stage 1 through stage 4 chronic kidney disease, or unspecified chronic kidney disease: Secondary | ICD-10-CM | POA: Diagnosis present

## 2023-04-19 DIAGNOSIS — R296 Repeated falls: Secondary | ICD-10-CM | POA: Diagnosis not present

## 2023-04-19 DIAGNOSIS — M79659 Pain in unspecified thigh: Secondary | ICD-10-CM

## 2023-04-19 DIAGNOSIS — R531 Weakness: Principal | ICD-10-CM

## 2023-04-19 DIAGNOSIS — M6281 Muscle weakness (generalized): Secondary | ICD-10-CM | POA: Diagnosis not present

## 2023-04-19 DIAGNOSIS — K219 Gastro-esophageal reflux disease without esophagitis: Secondary | ICD-10-CM | POA: Diagnosis present

## 2023-04-19 DIAGNOSIS — M48061 Spinal stenosis, lumbar region without neurogenic claudication: Secondary | ICD-10-CM | POA: Diagnosis not present

## 2023-04-19 DIAGNOSIS — M25551 Pain in right hip: Secondary | ICD-10-CM | POA: Diagnosis not present

## 2023-04-19 DIAGNOSIS — B9689 Other specified bacterial agents as the cause of diseases classified elsewhere: Secondary | ICD-10-CM | POA: Diagnosis present

## 2023-04-19 DIAGNOSIS — E860 Dehydration: Secondary | ICD-10-CM | POA: Diagnosis present

## 2023-04-19 DIAGNOSIS — N400 Enlarged prostate without lower urinary tract symptoms: Secondary | ICD-10-CM | POA: Diagnosis not present

## 2023-04-19 DIAGNOSIS — K6389 Other specified diseases of intestine: Secondary | ICD-10-CM | POA: Diagnosis not present

## 2023-04-19 DIAGNOSIS — M5136 Other intervertebral disc degeneration, lumbar region: Secondary | ICD-10-CM | POA: Diagnosis present

## 2023-04-19 DIAGNOSIS — H814 Vertigo of central origin: Secondary | ICD-10-CM | POA: Diagnosis not present

## 2023-04-19 DIAGNOSIS — Z833 Family history of diabetes mellitus: Secondary | ICD-10-CM | POA: Diagnosis not present

## 2023-04-19 DIAGNOSIS — Z882 Allergy status to sulfonamides status: Secondary | ICD-10-CM | POA: Diagnosis not present

## 2023-04-19 DIAGNOSIS — E1122 Type 2 diabetes mellitus with diabetic chronic kidney disease: Secondary | ICD-10-CM | POA: Diagnosis present

## 2023-04-19 DIAGNOSIS — N411 Chronic prostatitis: Secondary | ICD-10-CM | POA: Diagnosis present

## 2023-04-19 DIAGNOSIS — N1831 Chronic kidney disease, stage 3a: Secondary | ICD-10-CM | POA: Diagnosis present

## 2023-04-19 DIAGNOSIS — R1110 Vomiting, unspecified: Secondary | ICD-10-CM | POA: Diagnosis not present

## 2023-04-19 DIAGNOSIS — R1310 Dysphagia, unspecified: Secondary | ICD-10-CM | POA: Diagnosis not present

## 2023-04-19 DIAGNOSIS — R42 Dizziness and giddiness: Secondary | ICD-10-CM | POA: Diagnosis not present

## 2023-04-19 DIAGNOSIS — E1169 Type 2 diabetes mellitus with other specified complication: Secondary | ICD-10-CM | POA: Diagnosis not present

## 2023-04-19 DIAGNOSIS — D72829 Elevated white blood cell count, unspecified: Secondary | ICD-10-CM

## 2023-04-19 DIAGNOSIS — M5116 Intervertebral disc disorders with radiculopathy, lumbar region: Secondary | ICD-10-CM | POA: Diagnosis not present

## 2023-04-19 DIAGNOSIS — Z8673 Personal history of transient ischemic attack (TIA), and cerebral infarction without residual deficits: Secondary | ICD-10-CM | POA: Diagnosis not present

## 2023-04-19 DIAGNOSIS — R0902 Hypoxemia: Secondary | ICD-10-CM | POA: Diagnosis not present

## 2023-04-19 DIAGNOSIS — M25552 Pain in left hip: Secondary | ICD-10-CM | POA: Diagnosis not present

## 2023-04-19 DIAGNOSIS — N3000 Acute cystitis without hematuria: Secondary | ICD-10-CM | POA: Diagnosis not present

## 2023-04-19 DIAGNOSIS — Z7902 Long term (current) use of antithrombotics/antiplatelets: Secondary | ICD-10-CM

## 2023-04-19 DIAGNOSIS — G8929 Other chronic pain: Secondary | ICD-10-CM | POA: Diagnosis not present

## 2023-04-19 DIAGNOSIS — I2510 Atherosclerotic heart disease of native coronary artery without angina pectoris: Secondary | ICD-10-CM | POA: Diagnosis present

## 2023-04-19 DIAGNOSIS — K449 Diaphragmatic hernia without obstruction or gangrene: Secondary | ICD-10-CM | POA: Diagnosis not present

## 2023-04-19 DIAGNOSIS — M79651 Pain in right thigh: Secondary | ICD-10-CM | POA: Diagnosis present

## 2023-04-19 DIAGNOSIS — M79604 Pain in right leg: Secondary | ICD-10-CM | POA: Diagnosis not present

## 2023-04-19 DIAGNOSIS — Z7401 Bed confinement status: Secondary | ICD-10-CM | POA: Diagnosis not present

## 2023-04-19 DIAGNOSIS — F17210 Nicotine dependence, cigarettes, uncomplicated: Secondary | ICD-10-CM | POA: Diagnosis not present

## 2023-04-19 DIAGNOSIS — Z96641 Presence of right artificial hip joint: Secondary | ICD-10-CM | POA: Diagnosis present

## 2023-04-19 DIAGNOSIS — N390 Urinary tract infection, site not specified: Secondary | ICD-10-CM | POA: Diagnosis not present

## 2023-04-19 DIAGNOSIS — E871 Hypo-osmolality and hyponatremia: Secondary | ICD-10-CM | POA: Diagnosis not present

## 2023-04-19 DIAGNOSIS — Z79899 Other long term (current) drug therapy: Secondary | ICD-10-CM | POA: Diagnosis not present

## 2023-04-19 DIAGNOSIS — Z471 Aftercare following joint replacement surgery: Secondary | ICD-10-CM | POA: Diagnosis not present

## 2023-04-19 DIAGNOSIS — N281 Cyst of kidney, acquired: Secondary | ICD-10-CM | POA: Diagnosis not present

## 2023-04-19 DIAGNOSIS — M47816 Spondylosis without myelopathy or radiculopathy, lumbar region: Secondary | ICD-10-CM | POA: Diagnosis not present

## 2023-04-19 DIAGNOSIS — R262 Difficulty in walking, not elsewhere classified: Secondary | ICD-10-CM | POA: Diagnosis not present

## 2023-04-19 DIAGNOSIS — R2681 Unsteadiness on feet: Secondary | ICD-10-CM | POA: Diagnosis not present

## 2023-04-19 DIAGNOSIS — Z716 Tobacco abuse counseling: Secondary | ICD-10-CM

## 2023-04-19 DIAGNOSIS — E785 Hyperlipidemia, unspecified: Secondary | ICD-10-CM | POA: Diagnosis present

## 2023-04-19 DIAGNOSIS — R41841 Cognitive communication deficit: Secondary | ICD-10-CM | POA: Diagnosis not present

## 2023-04-19 DIAGNOSIS — M5117 Intervertebral disc disorders with radiculopathy, lumbosacral region: Secondary | ICD-10-CM | POA: Diagnosis not present

## 2023-04-19 DIAGNOSIS — M4316 Spondylolisthesis, lumbar region: Secondary | ICD-10-CM | POA: Diagnosis not present

## 2023-04-19 LAB — URINALYSIS, ROUTINE W REFLEX MICROSCOPIC
Bilirubin Urine: NEGATIVE
Glucose, UA: >=500 mg/dL — AB
Ketones, ur: NEGATIVE mg/dL
Nitrite: NEGATIVE
Protein, ur: NEGATIVE mg/dL
Specific Gravity, Urine: 1.011 (ref 1.005–1.030)
WBC, UA: >50 WBC/hpf (ref 0–5)
pH: 6 (ref 5.0–8.0)

## 2023-04-19 LAB — TROPONIN I (HIGH SENSITIVITY): Troponin I (High Sensitivity): 10 ng/L (ref <18)

## 2023-04-19 LAB — COMPREHENSIVE METABOLIC PANEL
ALT: 14 U/L (ref 0–44)
AST: 16 U/L (ref 15–41)
Albumin: 3.5 g/dL (ref 3.5–5.0)
Alkaline Phosphatase: 124 U/L (ref 38–126)
Anion gap: 11 (ref 5–15)
BUN: 11 mg/dL (ref 8–23)
CO2: 21 mmol/L — ABNORMAL LOW (ref 22–32)
Calcium: 8.9 mg/dL (ref 8.9–10.3)
Chloride: 92 mmol/L — ABNORMAL LOW (ref 98–111)
Creatinine, Ser: 1.06 mg/dL (ref 0.61–1.24)
GFR, Estimated: >60 mL/min (ref >60)
Glucose, Bld: 154 mg/dL — ABNORMAL HIGH (ref 70–99)
Potassium: 3.5 mmol/L (ref 3.5–5.1)
Sodium: 124 mmol/L — ABNORMAL LOW (ref 135–145)
Total Bilirubin: 1.1 mg/dL (ref 0.3–1.2)
Total Protein: 7.1 g/dL (ref 6.5–8.1)

## 2023-04-19 LAB — CBC WITH DIFFERENTIAL/PLATELET
Abs Immature Granulocytes: 0.65 10*3/uL — ABNORMAL HIGH (ref 0.00–0.07)
Basophils Absolute: 0.1 10*3/uL (ref 0.0–0.1)
Basophils Relative: 0 %
Eosinophils Absolute: 0 10*3/uL (ref 0.0–0.5)
Eosinophils Relative: 0 %
HCT: 45.9 % (ref 39.0–52.0)
Hemoglobin: 15.9 g/dL (ref 13.0–17.0)
Immature Granulocytes: 3 %
Lymphocytes Relative: 4 %
Lymphs Abs: 0.9 10*3/uL (ref 0.7–4.0)
MCH: 29.4 pg (ref 26.0–34.0)
MCHC: 34.6 g/dL (ref 30.0–36.0)
MCV: 85 fL (ref 80.0–100.0)
Monocytes Absolute: 1.4 10*3/uL — ABNORMAL HIGH (ref 0.1–1.0)
Monocytes Relative: 6 %
Neutro Abs: 21.3 10*3/uL — ABNORMAL HIGH (ref 1.7–7.7)
Neutrophils Relative %: 87 %
Platelets: 288 10*3/uL (ref 150–400)
RBC: 5.4 MIL/uL (ref 4.22–5.81)
RDW: 14 % (ref 11.5–15.5)
WBC: 24.3 10*3/uL — ABNORMAL HIGH (ref 4.0–10.5)
nRBC: 0 % (ref 0.0–0.2)

## 2023-04-19 LAB — LIPASE, BLOOD: Lipase: 31 U/L (ref 11–51)

## 2023-04-19 LAB — BASIC METABOLIC PANEL
Anion gap: 9 (ref 5–15)
BUN: 10 mg/dL (ref 8–23)
CO2: 20 mmol/L — ABNORMAL LOW (ref 22–32)
Calcium: 8.1 mg/dL — ABNORMAL LOW (ref 8.9–10.3)
Chloride: 96 mmol/L — ABNORMAL LOW (ref 98–111)
Creatinine, Ser: 1.09 mg/dL (ref 0.61–1.24)
GFR, Estimated: >60 mL/min (ref >60)
Glucose, Bld: 135 mg/dL — ABNORMAL HIGH (ref 70–99)
Potassium: 3.6 mmol/L (ref 3.5–5.1)
Sodium: 125 mmol/L — ABNORMAL LOW (ref 135–145)

## 2023-04-19 LAB — OSMOLALITY: Osmolality: 273 mOsm/kg — ABNORMAL LOW (ref 275–295)

## 2023-04-19 LAB — SODIUM, URINE, RANDOM: Sodium, Ur: 38 mmol/L

## 2023-04-19 MED ORDER — SODIUM CHLORIDE 0.9 % IV SOLN
1.0000 g | Freq: Once | INTRAVENOUS | Status: AC
  Administered 2023-04-19: 1 g via INTRAVENOUS
  Filled 2023-04-19: qty 1

## 2023-04-19 MED ORDER — PANTOPRAZOLE SODIUM 40 MG IV SOLR
40.0000 mg | Freq: Every day | INTRAVENOUS | Status: DC
  Administered 2023-04-19 – 2023-04-20 (×2): 40 mg via INTRAVENOUS
  Filled 2023-04-19 (×2): qty 10

## 2023-04-19 MED ORDER — POLYETHYLENE GLYCOL 3350 17 G PO PACK: 17.0000 g | PACK | Freq: Every day | ORAL | Status: DC | PRN

## 2023-04-19 MED ORDER — SODIUM CHLORIDE 0.9 % IV SOLN
1.0000 g | INTRAVENOUS | Status: DC
  Administered 2023-04-20: 1 g via INTRAVENOUS
  Filled 2023-04-19: qty 10

## 2023-04-19 MED ORDER — LACTATED RINGERS IV BOLUS
1000.0000 mL | Freq: Once | INTRAVENOUS | Status: AC
  Administered 2023-04-19: 1000 mL via INTRAVENOUS

## 2023-04-19 MED ORDER — IOHEXOL 9 MG/ML PO SOLN
ORAL | Status: AC
  Filled 2023-04-19: qty 1000

## 2023-04-19 MED ORDER — ONDANSETRON HCL 4 MG/2ML IJ SOLN
4.0000 mg | Freq: Once | INTRAMUSCULAR | Status: AC
  Administered 2023-04-19: 4 mg via INTRAVENOUS

## 2023-04-19 MED ORDER — SENNOSIDES-DOCUSATE SODIUM 8.6-50 MG PO TABS
1.0000 | ORAL_TABLET | Freq: Two times a day (BID) | ORAL | Status: DC
  Administered 2023-04-19 – 2023-04-23 (×4): 1 via ORAL
  Filled 2023-04-19 (×8): qty 1

## 2023-04-19 MED ORDER — ONDANSETRON HCL 4 MG/2ML IJ SOLN
INTRAMUSCULAR | Status: AC
  Filled 2023-04-19: qty 2

## 2023-04-19 MED ORDER — CLOPIDOGREL BISULFATE 75 MG PO TABS
75.0000 mg | ORAL_TABLET | Freq: Every day | ORAL | Status: DC
  Administered 2023-04-19 – 2023-04-23 (×5): 75 mg via ORAL
  Filled 2023-04-19 (×5): qty 1

## 2023-04-19 MED ORDER — ACETAMINOPHEN 650 MG RE SUPP: 650.0000 mg | Freq: Four times a day (QID) | RECTAL | Status: DC | PRN

## 2023-04-19 MED ORDER — PANTOPRAZOLE SODIUM 40 MG PO TBEC: 40.0000 mg | DELAYED_RELEASE_TABLET | Freq: Every day | ORAL | Status: DC

## 2023-04-19 MED ORDER — SODIUM CHLORIDE 0.9% FLUSH
3.0000 mL | Freq: Two times a day (BID) | INTRAVENOUS | Status: DC
  Administered 2023-04-19 – 2023-04-23 (×7): 3 mL via INTRAVENOUS

## 2023-04-19 MED ORDER — ONDANSETRON HCL 4 MG/2ML IJ SOLN
4.0000 mg | Freq: Four times a day (QID) | INTRAMUSCULAR | Status: DC | PRN
  Administered 2023-04-22: 4 mg via INTRAVENOUS
  Filled 2023-04-19: qty 2

## 2023-04-19 MED ORDER — ACETAMINOPHEN 325 MG PO TABS: 650.0000 mg | ORAL_TABLET | Freq: Four times a day (QID) | ORAL | Status: DC | PRN

## 2023-04-19 MED ORDER — IOHEXOL 9 MG/ML PO SOLN
500.0000 mL | ORAL | Status: AC
  Administered 2023-04-19 (×2): 500 mL via ORAL

## 2023-04-19 MED ORDER — ENOXAPARIN SODIUM 40 MG/0.4ML IJ SOSY
40.0000 mg | PREFILLED_SYRINGE | INTRAMUSCULAR | Status: DC
  Administered 2023-04-20 – 2023-04-23 (×4): 40 mg via SUBCUTANEOUS
  Filled 2023-04-19 (×4): qty 0.4

## 2023-04-19 MED ORDER — TRAZODONE HCL 50 MG PO TABS: 50.0000 mg | ORAL_TABLET | Freq: Every evening | ORAL | Status: DC | PRN

## 2023-04-19 MED ORDER — SODIUM CHLORIDE 0.9 % IV BOLUS
1000.0000 mL | Freq: Once | INTRAVENOUS | Status: AC
  Administered 2023-04-19: 1000 mL via INTRAVENOUS

## 2023-04-19 NOTE — ED Triage Notes (Signed)
Patient biba. Per medics family requested family come to Ed due to increased fall and shaking for the last year and half. States he is increasingly weak and has right hip pain. No fall today. Patient denies head injury or any other complaints at this time. States "I can't Walk". AaOX4, Nad, resp even and unlabored.

## 2023-04-19 NOTE — Assessment & Plan Note (Signed)
S/p ceftriaxone in the ER, will treat with ceftriaxone.  Culture pending.  Noted to have leukocytosis.

## 2023-04-19 NOTE — ED Notes (Signed)
ED TO INPATIENT HANDOFF REPORT  Name/Age/Gender Antonio Serrano 76 y.o. male  Code Status    Code Status Orders  (From admission, onward)           Start     Ordered   04/19/23 1611  Full code  Continuous       Question:  By:  Answer:  Other   04/19/23 1611           Code Status History     Date Active Date Inactive Code Status Order ID Comments User Context   01/04/2022 1529 01/05/2022 2003 Full Code [id number]  Emeline General, MD ED   04/23/2021 0118 04/25/2021 1649 Full Code [id number]  Marlow Baars, MD ED       Home/SNF/Other Home  Chief Complaint UTI (urinary tract infection) [N39.0]  Level of Care/Admitting Diagnosis ED Disposition     ED Disposition  Admit   Condition  --   Comment  Hospital Area: [facility] [[id number]]  Level of Care: Telemetry [5]  Admit to tele based on following criteria: Other see comments  Comments: hyponatremia  May admit patient to [facility] or [facility] if equivalent level of care is available:: No  Covid Evaluation: Asymptomatic - no recent exposure (last 10 days) testing not required  Diagnosis: UTI (urinary tract infection) [782956]  Admitting Physician: Nolberto Hanlon [[id number]]  Attending Physician: Nolberto Hanlon [[id number]]  Certification:: I certify this patient will need inpatient services for at least 2 midnights  Estimated Length of Stay: 3          Medical History Past Medical History:  Diagnosis Date   Atherosclerosis of coronary artery    On chest CT->calcif in LAD and RCA   Chronic low back pain    MR 11/2021 showed L4-5 foraminal stenosis--> plan per Ortho was to do L4-5 injection   Chronic prostatitis    Very mild elevation of PSA (>4), referred to [facility] where PSA repeat was 1.0 on 06/16/13.  Annual PSA repeat is all that is needed now per urologist.  Most recent 07/2015 was 0.45.   Chronic renal insufficiency, stage III (moderate) (HCC)    Baseline GFR as of 2019= 50s.     Closed right hip  fracture (HCC) 03/2021   THA   Diabetes mellitus with complication (HCC) Dx'd fall 2011   HTN (hypertension)    Renal/aortic doppler u/s normal 06/2010   Hypercalcemia    Hyperlipidemia 2016   Statin intolerant--myalgias.  Pt refuses any further trial of statin as of 2018.   Nonspecific abnormal electrocardiogram (ECG) (EKG) Fall 2011   TWI in inferior leads: myocardial perfusion scan neg and echo normal 07/2010   Pseudomonas aeruginosa colonization    12/2021 urine clx   TIA (transient ischemic attack)    12/2021- CT angio head/neck no high grade stenosis.  MR showed old lacunar infarcts corona radiata, basal ganglia, thalamus.  Echo normal.   Tobacco dependence    UTI (lower urinary tract infection)    Feb 2012 (klebsiella--dx at [facility]); 03/2013 e coli.     Allergies Allergies  Allergen Reactions   Bactrim [Sulfamethoxazole-Trimethoprim] Nausea Only    IV Location/Drains/Wounds Patient Lines/Drains/Airways Status     Active Line/Drains/Airways     Name Placement date Placement time Site Days   Peripheral IV 04/19/23 20 G Right;Posterior Hand 04/19/23  1222  Hand  less than 1            Labs/Imaging Results for orders placed or  performed during the hospital encounter of 04/19/23 (from the past 48 hour(s))  CBC with Differential     Status: Abnormal   Collection Time: 04/19/23 12:27 PM  Result Value Ref Range   WBC 24.3 (H) 4.0 - 10.5 K/uL   RBC 5.40 4.22 - 5.81 MIL/uL   Hemoglobin 15.9 13.0 - 17.0 g/dL   HCT 16.1 09.6 - 04.5 %   MCV 85.0 80.0 - 100.0 fL   MCH 29.4 26.0 - 34.0 pg   MCHC 34.6 30.0 - 36.0 g/dL   RDW 40.9 81.1 - 91.4 %   Platelets 288 150 - 400 K/uL   nRBC 0.0 0.0 - 0.2 %   Neutrophils Relative % 87 %   Neutro Abs 21.3 (H) 1.7 - 7.7 K/uL   Lymphocytes Relative 4 %   Lymphs Abs 0.9 0.7 - 4.0 K/uL   Monocytes Relative 6 %   Monocytes Absolute 1.4 (H) 0.1 - 1.0 K/uL   Eosinophils Relative 0 %   Eosinophils Absolute 0.0 0.0 - 0.5 K/uL   Basophils  Relative 0 %   Basophils Absolute 0.1 0.0 - 0.1 K/uL   Immature Granulocytes 3 %   Abs Immature Granulocytes 0.65 (H) 0.00 - 0.07 K/uL    Comment: Performed at [facility], [address] [place], [place] [address]  Comprehensive metabolic panel     Status: Abnormal   Collection Time: 04/19/23 12:27 PM  Result Value Ref Range   Sodium 124 (L) 135 - 145 mmol/L   Potassium 3.5 3.5 - 5.1 mmol/L   Chloride 92 (L) 98 - 111 mmol/L   CO2 21 (L) 22 - 32 mmol/L   Glucose, Bld 154 (H) 70 - 99 mg/dL    Comment: Glucose reference range applies only to samples taken after fasting for at least 8 hours.   BUN 11 8 - 23 mg/dL   Creatinine, Ser 6.21 0.61 - 1.24 mg/dL   Calcium 8.9 8.9 - 30.8 mg/dL   Total Protein 7.1 6.5 - 8.1 g/dL   Albumin 3.5 3.5 - 5.0 g/dL   AST 16 15 - 41 U/L   ALT 14 0 - 44 U/L   Alkaline Phosphatase 124 38 - 126 U/L   Total Bilirubin 1.1 0.3 - 1.2 mg/dL   GFR, Estimated >65 >78 mL/min    Comment: (NOTE) Calculated using the CKD-EPI Creatinine Equation (2021)    Anion gap 11 5 - 15    Comment: Performed at [facility], [address] [place], [place] [address]  Urinalysis, Routine w reflex microscopic -Urine, Clean Catch     Status: Abnormal   Collection Time: 04/19/23  1:09 PM  Result Value Ref Range   Color, Urine YELLOW YELLOW   APPearance CLEAR CLEAR   Specific Gravity, Urine 1.011 1.005 - 1.030   pH 6.0 5.0 - 8.0   Glucose, UA >=500 (A) NEGATIVE mg/dL   Hgb urine dipstick SMALL (A) NEGATIVE   Bilirubin Urine NEGATIVE NEGATIVE   Ketones, ur NEGATIVE NEGATIVE mg/dL   Protein, ur NEGATIVE NEGATIVE mg/dL   Nitrite NEGATIVE NEGATIVE   Leukocytes,Ua MODERATE (A) NEGATIVE   RBC / HPF 0-5 0 - 5 RBC/hpf   WBC, UA >50 0 - 5 WBC/hpf   Bacteria, UA RARE (A) NONE SEEN   Squamous Epithelial / HPF 0-5 0 - 5 /HPF   Hyaline Casts, UA PRESENT     Comment: Performed at [facility], [address] [place],  [place] [address]  MR LUMBAR SPINE WO CONTRAST  Result Date: 04/19/2023 CLINICAL DATA:  Lumbar radiculopathy, symptoms persist with greater than 6 weeks of treatment. Right leg pain and weakness. EXAM: MRI LUMBAR SPINE WITHOUT CONTRAST TECHNIQUE: Multiplanar, multisequence MR imaging of the lumbar spine was performed. No intravenous contrast was administered. COMPARISON:  Sign radiograph 04/19/2023. MRI of the lumbar spine 12/28/2021. FINDINGS: Segmentation: 5 non rib-bearing lumbar type vertebral bodies are present. The lowest fully formed vertebral body is L5. Alignment: Grade 1 anterolisthesis at L4-5 measures 4 mm, unchanged. No other significant listhesis is present. Lumbar lordosis is preserved. Vertebrae: The chronic type 2 Modic changes are present posteriorly at L4-5. Remote superior endplate fracture at T12 is stable. Marrow signal and vertebral body heights are otherwise normal. Conus medullaris and cauda equina: Conus extends to the L1 level. Conus and cauda equina appear normal. Paraspinal and other soft tissues: Chronic atrophy of the right kidney is again noted. A simple cyst at the upper pole measures 2.9 cm. Cystic lesions of the left kidney are incompletely imaged. Prompt renal protocol MRI or CT without and with contrast to better visualize. [employer] 2021; [id number], Bosniak Classification of Cystic Renal Masses, Version 2019. No significant adenopathy is present. No other solid organ lesions are present. Paraspinous musculature is within normal limits. Disc levels: T12-L1: Negative. L1-2: Mild disc bulging is present. No significant stenosis is present. L2-3: Insert normal disc level L3-4: Mild disc bulging and facet hypertrophy is present. L4-5: Uncovering of a broad-based disc protrusion is present. Mild right subarticular narrowing has increased. Moderate right and mild left foraminal stenosis is similar the prior exam. Moderate facet hypertrophy contributes bilaterally. L5-S1: A shallow  central disc protrusion is again seen. No focal stenosis is associated. IMPRESSION: 1. Mild right subarticular narrowing at L4-5 has increased. 2. Moderate right and mild left foraminal stenosis at L4-5 is similar the prior exam. 3. Shallow central disc protrusion at L5-S1 without significant stenosis. 4. Mild disc bulging and facet hypertrophy at L1-2 and L3-4 without significant stenosis. Electronically Signed   By: Marin Roberts M.D.   On: 04/19/2023 14:40   DG Hip Unilat With Pelvis 2-3 Views Right  Result Date: 04/19/2023 CLINICAL DATA:  Right hip pain. EXAM: DG HIP (WITH OR WITHOUT PELVIS) 2-3V RIGHT COMPARISON:  March 18, 2023. FINDINGS: Status post right total hip arthroplasty. No acute fracture or dislocation is noted. Vascular calcifications are noted. IMPRESSION: No acute abnormality seen. Electronically Signed   By: Lupita Raider M.D.   On: 04/19/2023 11:43   DG Lumbar Spine Complete  Result Date: 04/19/2023 CLINICAL DATA:  Pain EXAM: LUMBAR SPINE - COMPLETE 4+ VIEW COMPARISON:  MR 12/28/2021. FINDINGS: Anterolisthesis L4 on L5 measures approximately 6-7 mm. No signs of acute fracture or subluxation. Stable mild anterior superior endplate compression deformity of the T12 vertebra. The lumbar vertebral body heights are well preserved. Multilevel disc space narrowing and endplate spurring is again noted which appears most severe at the L4-5 level. Mild facet arthropathy is identified at L4-5 and L5-S1. Signs of previous right hip arthroplasty. Extensive aortic atherosclerotic calcifications. IMPRESSION: 1. No acute findings. 2. Grade 1 anterolisthesis L4 on L5. 3. Multilevel degenerative disc disease and facet arthropathy. 4. Aortic atherosclerosis. Electronically Signed   By: Signa Kell M.D.   On: 04/19/2023 11:42    Pending Labs Unresulted Labs (From admission, onward)     Start     Ordered   04/26/23 0500  Creatinine, serum  (enoxaparin (LOVENOX)    CrCl >/=  30 ml/min)  Weekly,    R     Comments: while on enoxaparin therapy    04/19/23 1611   04/20/23 0500  APTT  Tomorrow morning,   R        04/19/23 1611   04/20/23 0500  Protime-INR  Tomorrow morning,   R        04/19/23 1611   04/20/23 0500  Basic metabolic panel  Tomorrow morning,   R        04/19/23 1611   04/20/23 0500  CBC  Tomorrow morning,   R        04/19/23 1611   04/19/23 1626  Sodium, urine, random  Add-on,   AD        04/19/23 1625   04/19/23 1612  Osmolality  Add-on,   AD        04/19/23 1611   04/19/23 1611  CBC  (enoxaparin (LOVENOX)    CrCl >/= 30 ml/min)  Once,   R       Comments: Baseline for enoxaparin therapy IF NOT ALREADY DRAWN.  Notify MD if PLT < 100 K.    04/19/23 1611   04/19/23 1611  Creatinine, serum  (enoxaparin (LOVENOX)    CrCl >/= 30 ml/min)  Once,   R       Comments: Baseline for enoxaparin therapy IF NOT ALREADY DRAWN.    04/19/23 1611   04/19/23 1444  Urine Culture  Add-on,   AD       Question:  Indication  Answer:  Altered mental status (if no other cause identified)   04/19/23 1443            Vitals/Pain Today's Vitals   04/19/23 1300 04/19/23 1400 04/19/23 1500 04/19/23 1600  BP: (!) 153/84 136/80 135/71 135/78  Pulse: 75 75 75 68  Resp: 17 18 17 17   Temp:  98.8 F (37.1 C)    TempSrc:      SpO2: 98% 96% 99% 99%  Weight:      Height:      PainSc:        Isolation Precautions No active isolations  Medications Medications  enoxaparin (LOVENOX) injection 40 mg (has no administration in time range)  acetaminophen (TYLENOL) tablet 650 mg (has no administration in time range)    Or  acetaminophen (TYLENOL) suppository 650 mg (has no administration in time range)  polyethylene glycol (MIRALAX / GLYCOLAX) packet 17 g (has no administration in time range)  sodium chloride flush (NS) 0.9 % injection 3 mL (has no administration in time range)  lactated ringers bolus 1,000 mL (0 mLs Intravenous Stopped 04/19/23 1313)  ondansetron (ZOFRAN) injection 4 mg  (4 mg Intravenous Given 04/19/23 1223)  cefTRIAXone (ROCEPHIN) 1 g in sodium chloride 0.9 % 100 mL IVPB (0 g Intravenous Stopped 04/19/23 1529)  sodium chloride 0.9 % bolus 1,000 mL (1,000 mLs Intravenous New Bag/Given 04/19/23 1624)    Mobility walks with person assist

## 2023-04-19 NOTE — ED Provider Notes (Signed)
[place] EMERGENCY DEPARTMENT AT [facility] Provider Note   CSN: [id number] Arrival date & time: 04/19/23  4742     History  Chief Complaint  Patient presents with   Hip Pain    Antonio Serrano is a 77 y.o. male.  Pt with c/o nausea/vomiting since last night, and right leg pain for 'past year'. Emesis not bloody or bilious. No recent known ill contacts, bad food ingestion or antibiotic use. No abd pain or distension. Feels is urinating less than normal. No fever or chills. Right leg pain radiates from hip down anterior/lat leg.  No acute change in RLE pain today. No RLE swelling. No back pain.   The history is provided by the patient, medical records and a relative.  Hip Pain Pertinent negatives include no chest pain, no abdominal pain, no headaches and no shortness of breath.       Home Medications Prior to Admission medications   Medication Sig Start Date End Date Taking? Authorizing Provider  amLODipine (NORVASC) 5 MG tablet Take 1 tablet (5 mg total) by mouth daily. 11/12/22   [name], [name], MD  amoxicillin-clavulanate (AUGMENTIN) 875-125 MG tablet Take 1 tablet by mouth every 12 (twelve) hours. 03/18/23   Small, Brooke L, PA  clopidogrel (PLAVIX) 75 MG tablet Take 1 tablet by mouth once daily 01/25/23   [name], [name], MD  Docusate Calcium (STOOL SOFTENER PO) Take 1 tablet by mouth daily as needed (constipation).    [provider]  ipratropium (ATROVENT) 0.03 % nasal spray USE 2 SPRAY(S) IN EACH NOSTRIL EVERY 12 HOURS 09/13/21   [name], [name], MD  mirabegron ER (MYRBETRIQ) 50 MG TB24 tablet Take 1 tablet (50 mg total) by mouth daily. 02/11/23   [name], [name], MD  OVER THE COUNTER MEDICATION Take 2 tablets by mouth daily. Elderberry, zinc, and vitamin C combo medication OTC. Patient not taking: Reported on 02/11/2023    [provider]  pantoprazole (PROTONIX) 40 MG tablet Take 1 tablet by mouth once daily 04/18/23   [name], [name],  MD  pioglitazone (ACTOS) 15 MG tablet 1 TABLET BY MOUTH ONCE DAILY 02/11/23   [name], [name], MD  traZODone (DESYREL) 50 MG tablet TAKE 1 TABLET BY MOUTH EVERY DAY AT BEDTIME AS NEEDED FOR SLEEP 02/11/23   [name], [name], MD      Allergies    Bactrim [sulfamethoxazole-trimethoprim]    Review of Systems   Review of Systems  Constitutional:  Negative for fever.  HENT:  Negative for sore throat.   Eyes:  Negative for redness.  Respiratory:  Negative for cough and shortness of breath.   Cardiovascular:  Negative for chest pain and leg swelling.  Gastrointestinal:  Positive for nausea and vomiting. Negative for abdominal pain and diarrhea.  Genitourinary:  Negative for dysuria and flank pain.  Musculoskeletal:  Negative for back pain and neck pain.  Skin:  Negative for rash.  Neurological:  Negative for weakness, numbness and headaches.  Hematological:  Does not bruise/bleed easily.  Psychiatric/Behavioral:  Negative for confusion.     Physical Exam Updated Vital Signs BP (!) 153/84   Pulse 75   Temp 98.7 F (37.1 C) (Oral)   Resp 17   Ht 1.651 m (5\' 5" )   Wt 67 kg   SpO2 98%   BMI 24.58 kg/m  Physical Exam Vitals and nursing note reviewed.  Constitutional:      Appearance: Normal appearance. He is well-developed.  HENT:  Head: Atraumatic.     Nose: Nose normal.     Mouth/Throat:     Mouth: Mucous membranes are moist.     Pharynx: Oropharynx is clear.  Eyes:     General: No scleral icterus.    Conjunctiva/sclera: Conjunctivae normal.  Neck:     Trachea: No tracheal deviation.  Cardiovascular:     Rate and Rhythm: Regular rhythm. Tachycardia present.     Pulses: Normal pulses.     Heart sounds: Normal heart sounds. No murmur heard.    No friction rub. No gallop.  Pulmonary:     Effort: Pulmonary effort is normal. No accessory muscle usage or respiratory distress.     Breath sounds: Normal breath sounds.  Abdominal:     General: Bowel sounds are normal.  There is no distension.     Palpations: Abdomen is soft. There is no mass.     Tenderness: There is no abdominal tenderness. There is no guarding.  Genitourinary:    Comments: No cva tenderness. Musculoskeletal:        General: No swelling or tenderness.     Cervical back: Normal range of motion and neck supple. No rigidity.     Right lower leg: No edema.     Left lower leg: No edema.     Comments: RLE of normal color and warmth. Distal pulses palp. Good passive rom at right hip and knee without pain. Straight leg raise neg. L/S spine non tender, aligned.   Skin:    General: Skin is warm and dry.     Findings: No rash.  Neurological:     Mental Status: He is alert.     Comments: Alert, speech clear. Motor/sens grossly intact bil. RLE motor intact, stre 5/5. Sens intact.   Psychiatric:        Mood and Affect: Mood normal.     ED Results / Procedures / Treatments   Labs (all labs ordered are listed, but only abnormal results are displayed) Results for orders placed or performed during the hospital encounter of 04/19/23  CBC with Differential  Result Value Ref Range   WBC 24.3 (H) 4.0 - 10.5 K/uL   RBC 5.40 4.22 - 5.81 MIL/uL   Hemoglobin 15.9 13.0 - 17.0 g/dL   HCT 16.1 09.6 - 04.5 %   MCV 85.0 80.0 - 100.0 fL   MCH 29.4 26.0 - 34.0 pg   MCHC 34.6 30.0 - 36.0 g/dL   RDW 40.9 81.1 - 91.4 %   Platelets 288 150 - 400 K/uL   nRBC 0.0 0.0 - 0.2 %   Neutrophils Relative % 87 %   Neutro Abs 21.3 (H) 1.7 - 7.7 K/uL   Lymphocytes Relative 4 %   Lymphs Abs 0.9 0.7 - 4.0 K/uL   Monocytes Relative 6 %   Monocytes Absolute 1.4 (H) 0.1 - 1.0 K/uL   Eosinophils Relative 0 %   Eosinophils Absolute 0.0 0.0 - 0.5 K/uL   Basophils Relative 0 %   Basophils Absolute 0.1 0.0 - 0.1 K/uL   Immature Granulocytes 3 %   Abs Immature Granulocytes 0.65 (H) 0.00 - 0.07 K/uL  Comprehensive metabolic panel  Result Value Ref Range   Sodium 124 (L) 135 - 145 mmol/L   Potassium 3.5 3.5 - 5.1 mmol/L    Chloride 92 (L) 98 - 111 mmol/L   CO2 21 (L) 22 - 32 mmol/L   Glucose, Bld 154 (H) 70 - 99 mg/dL   BUN 11 8 -  23 mg/dL   Creatinine, Ser 5.40 0.61 - 1.24 mg/dL   Calcium 8.9 8.9 - 98.1 mg/dL   Total Protein 7.1 6.5 - 8.1 g/dL   Albumin 3.5 3.5 - 5.0 g/dL   AST 16 15 - 41 U/L   ALT 14 0 - 44 U/L   Alkaline Phosphatase 124 38 - 126 U/L   Total Bilirubin 1.1 0.3 - 1.2 mg/dL   GFR, Estimated >19 >14 mL/min   Anion gap 11 5 - 15  Urinalysis, Routine w reflex microscopic -Urine, Clean Catch  Result Value Ref Range   Color, Urine YELLOW YELLOW   APPearance CLEAR CLEAR   Specific Gravity, Urine 1.011 1.005 - 1.030   pH 6.0 5.0 - 8.0   Glucose, UA >=500 (A) NEGATIVE mg/dL   Hgb urine dipstick SMALL (A) NEGATIVE   Bilirubin Urine NEGATIVE NEGATIVE   Ketones, ur NEGATIVE NEGATIVE mg/dL   Protein, ur NEGATIVE NEGATIVE mg/dL   Nitrite NEGATIVE NEGATIVE   Leukocytes,Ua MODERATE (A) NEGATIVE   RBC / HPF 0-5 0 - 5 RBC/hpf   WBC, UA >50 0 - 5 WBC/hpf   Bacteria, UA RARE (A) NONE SEEN   Squamous Epithelial / HPF 0-5 0 - 5 /HPF   Hyaline Casts, UA PRESENT      EKG None  Radiology DG Hip Unilat With Pelvis 2-3 Views Right  Result Date: 04/19/2023 CLINICAL DATA:  Right hip pain. EXAM: DG HIP (WITH OR WITHOUT PELVIS) 2-3V RIGHT COMPARISON:  March 18, 2023. FINDINGS: Status post right total hip arthroplasty. No acute fracture or dislocation is noted. Vascular calcifications are noted. IMPRESSION: No acute abnormality seen. Electronically Signed   By: Lupita Raider M.D.   On: 04/19/2023 11:43   DG Lumbar Spine Complete  Result Date: 04/19/2023 CLINICAL DATA:  Pain EXAM: LUMBAR SPINE - COMPLETE 4+ VIEW COMPARISON:  MR 12/28/2021. FINDINGS: Anterolisthesis L4 on L5 measures approximately 6-7 mm. No signs of acute fracture or subluxation. Stable mild anterior superior endplate compression deformity of the T12 vertebra. The lumbar vertebral body heights are well preserved. Multilevel disc space  narrowing and endplate spurring is again noted which appears most severe at the L4-5 level. Mild facet arthropathy is identified at L4-5 and L5-S1. Signs of previous right hip arthroplasty. Extensive aortic atherosclerotic calcifications. IMPRESSION: 1. No acute findings. 2. Grade 1 anterolisthesis L4 on L5. 3. Multilevel degenerative disc disease and facet arthropathy. 4. Aortic atherosclerosis. Electronically Signed   By: Signa Kell M.D.   On: 04/19/2023 11:42    Procedures Procedures    Medications Ordered in ED Medications  cefTRIAXone (ROCEPHIN) 1 g in sodium chloride 0.9 % 100 mL IVPB (has no administration in time range)  lactated ringers bolus 1,000 mL (0 mLs Intravenous Stopped 04/19/23 1313)  ondansetron (ZOFRAN) injection 4 mg (4 mg Intravenous Given 04/19/23 1223)    ED Course/ Medical Decision Making/ A&P                             Medical Decision Making Problems Addressed: Acute UTI: acute illness or injury with systemic symptoms that poses a threat to life or bodily functions Generalized weakness: acute illness or injury with systemic symptoms that poses a threat to life or bodily functions Hyponatremia: acute illness or injury with systemic symptoms that poses a threat to life or bodily functions Leukocytosis, unspecified type: acute illness or injury Lumbar degenerative disc disease: chronic illness or injury Right leg pain:  chronic illness or injury  Amount and/or Complexity of Data Reviewed Independent Historian: EMS    Details: Family, hx External Data Reviewed: notes. Labs: ordered. Decision-making details documented in ED Course. Radiology: ordered and independent interpretation performed. Decision-making details documented in ED Course. Discussion of management or test interpretation with external provider(s): Medicine.  Risk Prescription drug management. Parenteral controlled substances. Decision regarding hospitalization.   Iv ns. Continuous pulse  ox and cardiac monitoring. Labs ordered/sent. Imaging ordered.   Differential diagnosis includes dehydration, gastroenteritis, ddd, OA, etc. Dispo decision including potential need for admission considered - will get labs and imaging and reassess.   Reviewed nursing notes and prior charts for additional history. External reports reviewed. Additional history from: family.   Cardiac monitor: sinus rhythm, rate 105.   Labs reviewed/interpreted by me - na low. Ns bolus. Ua c/w uti, cx sent. Rocephin iv. Wbc high.   Xrays reviewed/interpreted by me - no fx.   Bladder scan, pvr 0.   MRI reviewed/interpreted by me -   Given weakness, FTT, dehydration, uti, hyponatremia - will admit. Hospitalists consulted for admission.            Final Clinical Impression(s) / ED Diagnoses Final diagnoses:  None    Rx / DC Orders ED Discharge Orders     None         Cathren Laine, MD 04/19/23 1446

## 2023-04-19 NOTE — Assessment & Plan Note (Signed)
Gone for a couple of weeks, present only when patient bears weight, this is the principal reason for patient arrival to the ER.  Patient has had a right hip arthroplasty done in the past, x-ray of the area negative, MR lumbar spine nonactionable at this time.  Will get a of the right femur to rule out stress fracture

## 2023-04-19 NOTE — H&P (Addendum)
History and Physical    Patient: Antonio Serrano UJW:[id number] DOB: 1946/09/22 DOA: 04/19/2023 DOS: the patient was seen and examined on 04/19/2023 PCP: Jeoffrey Massed, MD  Patient coming from: Home  Chief Complaint:  Chief Complaint  Patient presents with   Hip Pain   HPI: Antonio Serrano is a 77 y.o. male with medical history significant of several chronic medical issues as listed beleow. Patient reports that for the last 2 weeks he has had progressive right thigh araea pain that is present when he bears weight on the affected extremity.  Pain is not present with passive movement or when patient is not bearing weight.  Patient states that pain has gotten so bad that today basically patient is not able to ambulate because of pain.  This is the principal reason for patient presenting to the ER today.  There has been no trauma no fall no back pain.  Pain seems to affect the right thigh affected just above the knee is aching in quality and radiates slightly downwards.  Patient also reports chronic vertigo that has been going on for at least a couple of years that is positional.  There is no change in that no headache no vision changes.  Patient also reports yesterday evening when he went to bed he was feeling nauseous and then had 3 episodes of vomiting between 3 AM and 5 AM this morning.  There has been no recurrence of vomiting since then there has been no abdominal pain.  Patient has been "constipated versus not having any bowel movement for the last 4 days.  Before that his bowel movements were normal.  Patient reports passing gas and denies any abdominal distention.  There is no report of fever rigors dysuria or back pain.  Patient actually tolerated coffee at approximately 7 to 8 AM this morning after his vomiting had subsided and he tolerated the coffee and kept it down.  However he came to the ER because of his inability to walk due to right thigh pain. Review of Systems: As mentioned in the  history of present illness. All other systems reviewed and are negative. Past Medical History:  Diagnosis Date   Atherosclerosis of coronary artery    On chest CT->calcif in LAD and RCA   Chronic low back pain    MR 11/2021 showed L4-5 foraminal stenosis--> plan per Ortho was to do L4-5 injection   Chronic prostatitis    Very mild elevation of PSA (>4), referred to [facility] where PSA repeat was 1.0 on 06/16/13.  Annual PSA repeat is all that is needed now per urologist.  Most recent 07/2015 was 0.45.   Chronic renal insufficiency, stage III (moderate) (HCC)    Baseline GFR as of 2019= 50s.     Closed right hip fracture (HCC) 03/2021   THA   Diabetes mellitus with complication (HCC) Dx'd fall 2011   HTN (hypertension)    Renal/aortic doppler u/s normal 06/2010   Hypercalcemia    Hyperlipidemia 2016   Statin intolerant--myalgias.  Pt refuses any further trial of statin as of 2018.   Nonspecific abnormal electrocardiogram (ECG) (EKG) Fall 2011   TWI in inferior leads: myocardial perfusion scan neg and echo normal 07/2010   Pseudomonas aeruginosa colonization    12/2021 urine clx   TIA (transient ischemic attack)    12/2021- CT angio head/neck no high grade stenosis.  MR showed old lacunar infarcts corona radiata, basal ganglia, thalamus.  Echo normal.   Tobacco dependence  UTI (lower urinary tract infection)    Feb 2012 (klebsiella--dx at [facility]); 03/2013 e coli.    Past Surgical History:  Procedure Laterality Date   CARDIOVASCULAR STRESS TEST  07/01/2010   Myocardial perfusion scan neg/low risk.   CATARACT EXTRACTION  2007, 2008   Bilateral ([facility] on [address].   COLONOSCOPY  12/20/04;12/2014   2016 no polyps: recall 5 yrs due to FH of colon ca   ESOPHAGOGASTRODUODENOSCOPY  11/29/2004   esoph stricture/dilation   TOTAL HIP ARTHROPLASTY Right 04/23/2021   Procedure: TOTAL HIP ARTHROPLASTY ANTERIOR APPROACH;  Surgeon: Cammy Copa, MD;  Location: [facility];   Service: Orthopedics;  Laterality: Right;  Hana, C-arm, Depuy   TRANSTHORACIC ECHOCARDIOGRAM  07/01/2010   2011 EF =>55%; LA mildly dilated; trace MR/TR.  12/2021 EF 60-65%, DD indeterm, valves nl.   Social History:  reports that he has been smoking cigarettes. He has a 12.5 pack-year smoking history. He has never used smokeless tobacco. He reports that he does not currently use alcohol. He reports that he does not use drugs.  Allergies  Allergen Reactions   Bactrim [Sulfamethoxazole-Trimethoprim] Nausea Only    Family History  Problem Relation Age of Onset   Dementia Mother    Pneumonia Father        died in 73's of pneumonia, was otherwise healthy   Cancer Sister        colon cancer.  Died age 38   Diabetes Paternal Aunt    Diabetes Sister    Colon cancer Neg Hx     Prior to Admission medications   Medication Sig Start Date End Date Taking? Authorizing Provider  acetaminophen (TYLENOL) 500 MG tablet Take 1,000 mg by mouth as needed for moderate pain.   Yes [provider]  clopidogrel (PLAVIX) 75 MG tablet Take 1 tablet by mouth once daily 01/25/23  Yes [name], [name], MD  Docusate Calcium (STOOL SOFTENER PO) Take 1 tablet by mouth daily as needed (constipation).   Yes [provider]  ipratropium (ATROVENT) 0.03 % nasal spray USE 2 SPRAY(S) IN EACH NOSTRIL EVERY 12 HOURS Patient taking differently: Place 1 spray into both nostrils as needed for rhinitis. 09/13/21  Yes [name], [name], MD  OVER THE COUNTER MEDICATION Take 1 tablet by mouth daily. Elderberry, zinc, and vitamin C combo medication OTC.   Yes [provider]  pantoprazole (PROTONIX) 40 MG tablet Take 1 tablet by mouth once daily 04/18/23  Yes [name], [name], MD  traZODone (DESYREL) 50 MG tablet TAKE 1 TABLET BY MOUTH EVERY DAY AT BEDTIME AS NEEDED FOR SLEEP Patient taking differently: Take 50 mg by mouth at bedtime as needed for sleep. 02/11/23  Yes [name], [name], MD   amoxicillin-clavulanate (AUGMENTIN) 875-125 MG tablet Take 1 tablet by mouth every 12 (twelve) hours. Patient not taking: Reported on 04/19/2023 03/18/23   Small, Harley [place], [place]    Physical Exam: Vitals:   04/19/23 1300 04/19/23 1400 04/19/23 1500 04/19/23 1600  BP: (!) 153/84 136/80 135/71 135/78  Pulse: 75 75 75 68  Resp: 17 18 17 17   Temp:  98.8 F (37.1 C)    TempSrc:      SpO2: 98% 96% 99% 99%  Weight:      Height:       General: 77 year old gentleman appears older than stated age, no distress, wife at bedside Respiratory exam: Bilateral intravesicular Cardiovascular exam S1-S2 normal Abdomen bowel sounds normal all quadrants soft nontender Extremities warm without edema.  Bilateral 5/5 strength no focal tenderness to direct palpation of the entirety of the right extremity.  No percussion tenderness. Data Reviewed:  Labs on Admission:  Results for orders placed or performed during the hospital encounter of 04/19/23 (from the past 24 hour(s))  CBC with Differential     Status: Abnormal   Collection Time: 04/19/23 12:27 PM  Result Value Ref Range   WBC 24.3 (H) 4.0 - 10.5 K/uL   RBC 5.40 4.22 - 5.81 MIL/uL   Hemoglobin 15.9 13.0 - 17.0 g/dL   HCT 16.1 09.6 - 04.5 %   MCV 85.0 80.0 - 100.0 fL   MCH 29.4 26.0 - 34.0 pg   MCHC 34.6 30.0 - 36.0 g/dL   RDW 40.9 81.1 - 91.4 %   Platelets 288 150 - 400 K/uL   nRBC 0.0 0.0 - 0.2 %   Neutrophils Relative % 87 %   Neutro Abs 21.3 (H) 1.7 - 7.7 K/uL   Lymphocytes Relative 4 %   Lymphs Abs 0.9 0.7 - 4.0 K/uL   Monocytes Relative 6 %   Monocytes Absolute 1.4 (H) 0.1 - 1.0 K/uL   Eosinophils Relative 0 %   Eosinophils Absolute 0.0 0.0 - 0.5 K/uL   Basophils Relative 0 %   Basophils Absolute 0.1 0.0 - 0.1 K/uL   Immature Granulocytes 3 %   Abs Immature Granulocytes 0.65 (H) 0.00 - 0.07 K/uL  Comprehensive metabolic panel     Status: Abnormal   Collection Time: 04/19/23 12:27 PM  Result Value Ref Range   Sodium 124 (L) 135 -  145 mmol/L   Potassium 3.5 3.5 - 5.1 mmol/L   Chloride 92 (L) 98 - 111 mmol/L   CO2 21 (L) 22 - 32 mmol/L   Glucose, Bld 154 (H) 70 - 99 mg/dL   BUN 11 8 - 23 mg/dL   Creatinine, Ser 7.82 0.61 - 1.24 mg/dL   Calcium 8.9 8.9 - 95.6 mg/dL   Total Protein 7.1 6.5 - 8.1 g/dL   Albumin 3.5 3.5 - 5.0 g/dL   AST 16 15 - 41 U/L   ALT 14 0 - 44 U/L   Alkaline Phosphatase 124 38 - 126 U/L   Total Bilirubin 1.1 0.3 - 1.2 mg/dL   GFR, Estimated >21 >30 mL/min   Anion gap 11 5 - 15  Urinalysis, Routine w reflex microscopic -Urine, Clean Catch     Status: Abnormal   Collection Time: 04/19/23  1:09 PM  Result Value Ref Range   Color, Urine YELLOW YELLOW   APPearance CLEAR CLEAR   Specific Gravity, Urine 1.011 1.005 - 1.030   pH 6.0 5.0 - 8.0   Glucose, UA >=500 (A) NEGATIVE mg/dL   Hgb urine dipstick SMALL (A) NEGATIVE   Bilirubin Urine NEGATIVE NEGATIVE   Ketones, ur NEGATIVE NEGATIVE mg/dL   Protein, ur NEGATIVE NEGATIVE mg/dL   Nitrite NEGATIVE NEGATIVE   Leukocytes,Ua MODERATE (A) NEGATIVE   RBC / HPF 0-5 0 - 5 RBC/hpf   WBC, UA >50 0 - 5 WBC/hpf   Bacteria, UA RARE (A) NONE SEEN   Squamous Epithelial / HPF 0-5 0 - 5 /HPF   Hyaline Casts, UA PRESENT    Basic Metabolic Panel: Recent Labs  Lab 04/19/23 1227  NA 124*  K 3.5  CL 92*  CO2 21*  GLUCOSE 154*  BUN 11  CREATININE 1.06  CALCIUM 8.9   Liver Function Tests: Recent Labs  Lab 04/19/23 1227  AST 16  ALT 14  ALKPHOS 124  BILITOT 1.1  PROT 7.1  ALBUMIN 3.5   No results for input(s): "LIPASE", "AMYLASE" in the last 168 hours. No results for input(s): "AMMONIA" in the last 168 hours. CBC: Recent Labs  Lab 04/19/23 1227  WBC 24.3*  NEUTROABS 21.3*  HGB 15.9  HCT 45.9  MCV 85.0  PLT 288   Cardiac Enzymes: No results for input(s): "CKTOTAL", "CKMB", "CKMBINDEX", "TROPONINIHS" in the last 168 hours.  BNP (last 3 results) No results for input(s): "PROBNP" in the last 8760 hours. CBG: No results for  input(s): "GLUCAP" in the last 168 hours.  Radiological Exams on Admission:  MR LUMBAR SPINE WO CONTRAST  Result Date: 04/19/2023 CLINICAL DATA:  Lumbar radiculopathy, symptoms persist with greater than 6 weeks of treatment. Right leg pain and weakness. EXAM: MRI LUMBAR SPINE WITHOUT CONTRAST TECHNIQUE: Multiplanar, multisequence MR imaging of the lumbar spine was performed. No intravenous contrast was administered. COMPARISON:  Sign radiograph 04/19/2023. MRI of the lumbar spine 12/28/2021. FINDINGS: Segmentation: 5 non rib-bearing lumbar type vertebral bodies are present. The lowest fully formed vertebral body is L5. Alignment: Grade 1 anterolisthesis at L4-5 measures 4 mm, unchanged. No other significant listhesis is present. Lumbar lordosis is preserved. Vertebrae: The chronic type 2 Modic changes are present posteriorly at L4-5. Remote superior endplate fracture at T12 is stable. Marrow signal and vertebral body heights are otherwise normal. Conus medullaris and cauda equina: Conus extends to the L1 level. Conus and cauda equina appear normal. Paraspinal and other soft tissues: Chronic atrophy of the right kidney is again noted. A simple cyst at the upper pole measures 2.9 cm. Cystic lesions of the left kidney are incompletely imaged. Prompt renal protocol MRI or CT without and with contrast to better visualize. [employer] 2021; [id number], Bosniak Classification of Cystic Renal Masses, Version 2019. No significant adenopathy is present. No other solid organ lesions are present. Paraspinous musculature is within normal limits. Disc levels: T12-L1: Negative. L1-2: Mild disc bulging is present. No significant stenosis is present. L2-3: Insert normal disc level L3-4: Mild disc bulging and facet hypertrophy is present. L4-5: Uncovering of a broad-based disc protrusion is present. Mild right subarticular narrowing has increased. Moderate right and mild left foraminal stenosis is similar the prior exam.  Moderate facet hypertrophy contributes bilaterally. L5-S1: A shallow central disc protrusion is again seen. No focal stenosis is associated. IMPRESSION: 1. Mild right subarticular narrowing at L4-5 has increased. 2. Moderate right and mild left foraminal stenosis at L4-5 is similar the prior exam. 3. Shallow central disc protrusion at L5-S1 without significant stenosis. 4. Mild disc bulging and facet hypertrophy at L1-2 and L3-4 without significant stenosis. Electronically Signed   By: Marin Roberts M.D.   On: 04/19/2023 14:40   DG Hip Unilat With Pelvis 2-3 Views Right  Result Date: 04/19/2023 CLINICAL DATA:  Right hip pain. EXAM: DG HIP (WITH OR WITHOUT PELVIS) 2-3V RIGHT COMPARISON:  March 18, 2023. FINDINGS: Status post right total hip arthroplasty. No acute fracture or dislocation is noted. Vascular calcifications are noted. IMPRESSION: No acute abnormality seen. Electronically Signed   By: Lupita Raider M.D.   On: 04/19/2023 11:43   DG Lumbar Spine Complete  Result Date: 04/19/2023 CLINICAL DATA:  Pain EXAM: LUMBAR SPINE - COMPLETE 4+ VIEW COMPARISON:  MR 12/28/2021. FINDINGS: Anterolisthesis L4 on L5 measures approximately 6-7 mm. No signs of acute fracture or subluxation. Stable mild anterior superior endplate compression deformity of the T12 vertebra. The lumbar vertebral body heights are well  preserved. Multilevel disc space narrowing and endplate spurring is again noted which appears most severe at the L4-5 level. Mild facet arthropathy is identified at L4-5 and L5-S1. Signs of previous right hip arthroplasty. Extensive aortic atherosclerotic calcifications. IMPRESSION: 1. No acute findings. 2. Grade 1 anterolisthesis L4 on L5. 3. Multilevel degenerative disc disease and facet arthropathy. 4. Aortic atherosclerosis. Electronically Signed   By: Signa Kell M.D.   On: 04/19/2023 11:42       Assessment and Plan: * Thigh pain Gone for a couple of weeks, present only when patient  bears weight, this is the principal reason for patient arrival to the ER.  Patient has had a right hip arthroplasty done in the past, x-ray of the area negative, MR lumbar spine nonactionable at this time.  Will get a of the right femur to rule out stress fracture  Vomiting 3 Episodes nonbloody between 3 AM to 5 AM.  Seems to have been self-limited, patient has tolerated liquids since then.  I favor restarting patient's diet and monitoring patient clinically I will just get an x-ray abdomen before we proceed with this and I will also get a lipase level.  UTI (urinary tract infection) S/p ceftriaxone in the ER, will treat with ceftriaxone.  Culture pending.  Noted to have leukocytosis.  Hyponatremia Is chronic although worse today.  Check serum osmolality check urine sodium.  It is felt to be asymptomatic at this time.  Patient has received a couple of liters of fluid in the ER because that he was felt to be dehydrated given his history of vomiting.  At this time we will trend sodium and see how he proceeds. Pending TSH coritsol in AM.      Advance Care Planning:   Code Status: Full Code   Consults: None at this time  Family Communication: Wife at the bedside  Severity of Illness: The appropriate patient status for this patient is INPATIENT. Inpatient status is judged to be reasonable and necessary in order to provide the required intensity of service to ensure the patient's safety. The patient's presenting symptoms, physical exam findings, and initial radiographic and laboratory data in the context of their chronic comorbidities is felt to place them at high risk for further clinical deterioration. Furthermore, it is not anticipated that the patient will be medically stable for discharge from the hospital within 2 midnights of admission.   * I certify that at the point of admission it is my clinical judgment that the patient will require inpatient hospital care spanning beyond 2 midnights from  the point of admission due to high intensity of service, high risk for further deterioration and high frequency of surveillance required.*  Author: Nolberto Hanlon, MD 04/19/2023 4:32 PM  For on call review [email or web address].

## 2023-04-19 NOTE — Assessment & Plan Note (Addendum)
3 Episodes nonbloody between 3 AM to 5 AM.  Seems to have been self-limited, patient has tolerated liquids since then.  I favor restarting patient's diet and monitoring patient clinically I will just get an x-ray abdomen before we proceed with this and I will also get a lipase level.

## 2023-04-19 NOTE — ED Triage Notes (Signed)
Pt family states they want him to come to ED for a MRI and not wait until his scheduled appt.

## 2023-04-19 NOTE — Assessment & Plan Note (Addendum)
Is chronic although worse today.  Check serum osmolality check urine sodium.  It is felt to be asymptomatic at this time.  Patient has received a couple of liters of fluid in the ER because that he was felt to be dehydrated given his history of vomiting.  At this time we will trend sodium and see how he proceeds. Pending TSH coritsol in AM.

## 2023-04-20 DIAGNOSIS — M79651 Pain in right thigh: Secondary | ICD-10-CM | POA: Diagnosis not present

## 2023-04-20 LAB — SEDIMENTATION RATE: Sed Rate: 22 mm/hr — ABNORMAL HIGH (ref 0–16)

## 2023-04-20 LAB — BASIC METABOLIC PANEL
Anion gap: 8 (ref 5–15)
BUN: 9 mg/dL (ref 8–23)
CO2: 24 mmol/L (ref 22–32)
Calcium: 8.6 mg/dL — ABNORMAL LOW (ref 8.9–10.3)
Chloride: 94 mmol/L — ABNORMAL LOW (ref 98–111)
Creatinine, Ser: 1.09 mg/dL (ref 0.61–1.24)
GFR, Estimated: >60 mL/min (ref >60)
Glucose, Bld: 126 mg/dL — ABNORMAL HIGH (ref 70–99)
Potassium: 3.8 mmol/L (ref 3.5–5.1)
Sodium: 126 mmol/L — ABNORMAL LOW (ref 135–145)

## 2023-04-20 LAB — CBC
HCT: 43.1 % (ref 39.0–52.0)
Hemoglobin: 15 g/dL (ref 13.0–17.0)
MCH: 30.2 pg (ref 26.0–34.0)
MCHC: 34.8 g/dL (ref 30.0–36.0)
MCV: 86.7 fL (ref 80.0–100.0)
Platelets: 247 10*3/uL (ref 150–400)
RBC: 4.97 MIL/uL (ref 4.22–5.81)
RDW: 14 % (ref 11.5–15.5)
WBC: 20.8 10*3/uL — ABNORMAL HIGH (ref 4.0–10.5)
nRBC: 0 % (ref 0.0–0.2)

## 2023-04-20 LAB — PROTIME-INR
INR: 1.3 — ABNORMAL HIGH (ref 0.8–1.2)
Prothrombin Time: 16.7 seconds — ABNORMAL HIGH (ref 11.4–15.2)

## 2023-04-20 LAB — TSH: TSH: 1.12 u[IU]/mL (ref 0.350–4.500)

## 2023-04-20 LAB — URINE CULTURE

## 2023-04-20 LAB — APTT: aPTT: 29 seconds (ref 24–36)

## 2023-04-20 LAB — CK: Total CK: 65 U/L (ref 49–397)

## 2023-04-20 LAB — CORTISOL: Cortisol, Plasma: 20.6 ug/dL

## 2023-04-20 MED ORDER — DEXTROSE IN LACTATED RINGERS 5 % IV SOLN: INTRAVENOUS | Status: DC

## 2023-04-20 MED ORDER — SODIUM CHLORIDE 0.9 % IV SOLN: INTRAVENOUS | Status: DC

## 2023-04-20 MED ORDER — HYDRALAZINE HCL 20 MG/ML IJ SOLN
10.0000 mg | Freq: Four times a day (QID) | INTRAMUSCULAR | Status: DC | PRN
  Administered 2023-04-22: 10 mg via INTRAVENOUS

## 2023-04-20 MED ORDER — PANTOPRAZOLE SODIUM 40 MG PO TBEC
40.0000 mg | DELAYED_RELEASE_TABLET | Freq: Every day | ORAL | Status: DC
  Administered 2023-04-21 – 2023-04-23 (×3): 40 mg via ORAL
  Filled 2023-04-20 (×3): qty 1

## 2023-04-20 NOTE — Evaluation (Addendum)
Physical Therapy Evaluation Patient Details Name: Antonio Serrano MRN: [id number] DOB: 1945-12-01 Today's Date: 04/20/2023  History of Present Illness  77 y.o. male admitted with progressive right thigh pain that is present when he bears weight on the affected extremity. Pain is not present with passive movement or when patient is not bearing weight. Pt also c/o N/V. xray right femur negative; MRI L spine Shallow central disc protrusion at L5-S1 without significant  stenosis.  4. Mild disc bulging and facet hypertrophy at L1-2 and L3-4 without  significant stenosis.      PMH: CKD, HTN, HLD, HTN, DM, R THA 2022.  Clinical Impression  Pt admitted with above diagnosis.  Pt limited by pain however feels it has improved from his pain level at home. No radicular symptoms at time of PT eval, strength and ROM WFL;  Pt endorses strong hx of falls at home however none in last 2 wks; repeated STS trials with pt needing mod to max assist to transition and maintain standing, posterior bias Will continue to follow, may need post acute rehab depending on progress.   Pt currently with functional limitations due to the deficits listed below (see PT Problem List). Pt will benefit from acute skilled PT to increase their independence and safety with mobility to allow discharge.           Assistance Recommended at Discharge Intermittent Supervision/Assistance  If plan is discharge home, recommend the following:  Can travel by private vehicle  A lot of help with walking and/or transfers;A lot of help with bathing/dressing/bathroom;Assistance with cooking/housework;Help with stairs or ramp for entrance;Assist for transportation   No    Equipment Recommendations None recommended by PT  Recommendations for Other Services       Functional Status Assessment Patient has had a recent decline in their functional status and demonstrates the ability to make significant improvements in function in a reasonable and  predictable amount of time.     Precautions / Restrictions Precautions Precautions: Fall Restrictions Weight Bearing Restrictions: No      Mobility  Bed Mobility Overal bed mobility: Needs Assistance Bed Mobility: Supine to Sit, Sit to Supine     Supine to sit: Mod assist Sit to supine: Mod assist   General bed mobility comments: assist to elevate trunk, assist to elevate LEs on to bed; incr time and effort    Transfers Overall transfer level: Needs assistance Equipment used: Rolling walker (2 wheels) Transfers: Sit to/from Stand Sit to Stand: Mod assist, Max assist           General transfer comment: assist to rise and stabilize, heavy assist to come to stand; less assist required with second trial; able to take lateral steps along EOB with mod assist, limtied by pain    Ambulation/Gait               General Gait Details: ltd d/t pain/poor standing balance  Stairs            Wheelchair Mobility     Tilt Bed    Modified Rankin (Stroke Patients Only)       Balance Overall balance assessment: Needs assistance, History of Falls Sitting-balance support: No upper extremity supported, Feet supported Sitting balance-Leahy Scale: Fair     Standing balance support: Reliant on assistive device for balance, Bilateral upper extremity supported Standing balance-Leahy Scale: Poor Standing balance comment: reliant on UEs and external assist, posterior bias and difficulty correcting wiht cues  Pertinent Vitals/Pain Pain Assessment Pain Assessment: Faces Faces Pain Scale: Hurts little more Pain Location: R lower leg Pain Descriptors / Indicators: Grimacing Pain Intervention(s): Limited activity within patient's tolerance, Monitored during session, Repositioned    Home Living Family/patient expects to be discharged to:: Private residence Living Arrangements: Spouse/significant other;Children Available Help at  Discharge: Family Type of Home: House Home Access: Stairs to enter   Secretary/administrator of Steps: 5   Home Layout: One level Home Equipment: Agricultural consultant (2 wheels)      Prior Function Prior Level of Function : Independent/Modified Independent             Mobility Comments: amb with RW most of time ADLs Comments: reports independence     Hand Dominance        Extremity/Trunk Assessment   Upper Extremity Assessment Upper Extremity Assessment: Defer to OT evaluation    Lower Extremity Assessment Lower Extremity Assessment: Overall WFL for tasks assessed;RLE deficits/detail RLE Deficits / Details: sensation intact to light touch; AROM WFL and pain free; strength at 4+/5 to 5/5; no radicular symptoms       Communication   Communication: No difficulties  Cognition Arousal/Alertness: Awake/alert Behavior During Therapy: WFL for tasks assessed/performed Overall Cognitive Status: Within Functional Limits for tasks assessed                                 General Comments: pt is A&O x4; pleasant and cooperative        General Comments      Exercises     Assessment/Plan    PT Assessment Patient needs continued PT services  PT Problem List Decreased strength;Decreased activity tolerance;Decreased balance;Decreased mobility;Decreased knowledge of precautions;Decreased knowledge of use of DME;Pain       PT Treatment Interventions DME instruction;Therapeutic exercise;Therapeutic activities;Patient/family education;Functional mobility training;Gait training;Balance training    PT Goals (Current goals can be found in the Care Plan section)  Acute Rehab PT Goals PT Goal Formulation: With patient Time For Goal Achievement: 05/03/23 Potential to Achieve Goals: Good    Frequency Min 1X/week     Co-evaluation               AM-PAC PT "6 Clicks" Mobility  Outcome Measure Help needed turning from your back to your side while in a flat bed  without using bedrails?: A Lot Help needed moving from lying on your back to sitting on the side of a flat bed without using bedrails?: A Lot Help needed moving to and from a bed to a chair (including a wheelchair)?: A Lot Help needed standing up from a chair using your arms (e.g., wheelchair or bedside chair)?: A Lot Help needed to walk in hospital room?: Total Help needed climbing 3-5 steps with a railing? : Total 6 Click Score: 10    End of Session Equipment Utilized During Treatment: Gait belt Activity Tolerance: Patient tolerated treatment well Patient left: with call bell/phone within reach;in bed;with bed alarm set   PT Visit Diagnosis: Other abnormalities of gait and mobility (R26.89);Difficulty in walking, not elsewhere classified (R26.2)    Time: 1610-9604 PT Time Calculation (min) (ACUTE ONLY): 24 min   Charges:   PT Evaluation $PT Eval Low Complexity: 1 Low PT Treatments $Therapeutic Activity: 8-22 mins PT General Charges $$ ACUTE PT VISIT: 1 Visit         Delice Bison, PT  Acute Rehab Dept ([facility]) [phone]  04/20/2023   [facility]  04/20/2023, 5:14 PM

## 2023-04-20 NOTE — Plan of Care (Signed)

## 2023-04-20 NOTE — Progress Notes (Signed)
PROGRESS NOTE    Antonio Serrano  ZOX:[id number] DOB: 11-02-1945 DOA: 04/19/2023 PCP: Jeoffrey Massed, MD   Brief Narrative:  Antonio Serrano is a 77 y.o. male with medical history significant of several chronic medical issues such as coronary artery disease, insulin-dependent type 2 diabetes, hypertension, chronic hyponatremia, chronic ambulation dysfunction, cigar smoker, hyperlipidemia, CKD stage II patient reports that for the last 2 weeks he has had progressive right thigh araea pain that is present when he bears weight on the affected extremity.  Pain is not present with passive movement or when patient is not bearing weight.  Patient states that pain has gotten so bad that today basically patient is not able to ambulate because of pain.   In ER: Afebrile, tachycardic, BP: 131/87, maintaining oxygen saturation on room air.  White blood cell 24.3, H&H: 15.9/45.9, platelet: 288, ANA: 124, creatinine: 1.06, received IV fluids in the ED.  MRI of right femur obtained and patient admitted for further eval and management of right thigh pain generalized weakness/physical deconditioning..  Assessment & Plan:   Right thigh pain: -X-ray of lumbar spine and right hip: Negative for any acute findings. -MRI negative for any acute findings. -PT/OT evaluation pending  UTI: -CT abdomen/pelvis negative for any acute findings.  Stable right renal atrophy, prostamegaly and small hiatal hernia. -UA concerning for infection.  Urine culture pending.  Continue Rocephin. -Leukocytosis improving.  Remained afebrile  Hypotonic Hyponatremia: -Appears to be chronic. -Low serum osmolality and normal urine sodium.  Check urine osmolarity  DDD lumbar: -MRI lumbar spine showed multilevel degenerative disc disease and facet arthropathy -Consult PT/OT  GERD: Continue PPI  CKD stage 3a: -Stable  Hyperlipidemia: Unable to tolerate statin per notes  TIA: Continue Plavix  Tobacco abuse: Counseled about  cessation  Physical deconditioning/progressive generalized weakness: Chronic dizziness Recurrent falls -Await PT evaluation.  DVT prophylaxis: Lovenox Code Status: SCD  Family Communication:  None present at bedside.  Plan of care discussed with patient in length and he verbalized understanding and agreed with it.  I called patient's wife with no response.  Disposition Plan: To be determined  Consultants:  None  Procedures:   None Antimicrobials:  Rocephin  Status is: Inpatient    Subjective: Patient seen and examined.  Reports that his lower right thigh pain have improved.  Still feel weak.  No acute events overnight.  Objective: Vitals:   04/19/23 2142 04/20/23 0234 04/20/23 0551 04/20/23 1146  BP: (!) 147/81 (!) 148/69 (!) 148/89 (!) 162/76  Pulse: 76 77 78 69  Resp: (!) 22 20 20 16   Temp: 98 F (36.7 C) 97.9 F (36.6 C) (!) 97.4 F (36.3 C) 97.7 F (36.5 C)  TempSrc: Oral Oral Oral Oral  SpO2: 98% 97% 99% 94%  Weight:      Height:        Intake/Output Summary (Last 24 hours) at 04/20/2023 1530 Last data filed at 04/20/2023 1500 Gross per 24 hour  Intake 2277.06 ml  Output 1925 ml  Net 352.06 ml   Filed Weights   04/19/23 1029  Weight: 67 kg    Examination:  General exam: Appears calm and comfortable, elderly male, on room air, communicating well  respiratory system: Clear to auscultation. Respiratory effort normal. Cardiovascular system: S1 & S2 heard, RRR. No JVD, murmurs, rubs, gallops or clicks. No pedal edema. Gastrointestinal system: Abdomen is nondistended, soft and nontender. No organomegaly or masses felt. Normal bowel sounds heard. Central nervous system: Alert and oriented. No focal neurological deficits.  Extremities: Symmetric 5 x 5 power. Skin: No rashes, lesions or ulcers Psychiatry: Judgement and insight appear normal. Mood & affect appropriate.    Data Reviewed: I have personally reviewed following labs and imaging  studies  CBC: Recent Labs  Lab 04/19/23 1227 04/20/23 0348  WBC 24.3* 20.8*  NEUTROABS 21.3*  --   HGB 15.9 15.0  HCT 45.9 43.1  MCV 85.0 86.7  PLT 288 247   Basic Metabolic Panel: Recent Labs  Lab 04/19/23 1227 04/19/23 1845 04/20/23 0348  NA 124* 125* 126*  K 3.5 3.6 3.8  CL 92* 96* 94*  CO2 21* 20* 24  GLUCOSE 154* 135* 126*  BUN 11 10 9   CREATININE 1.06 1.09 1.09  CALCIUM 8.9 8.1* 8.6*   GFR: Estimated Creatinine Clearance: 50.9 mL/min (by C-G formula based on SCr of 1.09 mg/dL). Liver Function Tests: Recent Labs  Lab 04/19/23 1227  AST 16  ALT 14  ALKPHOS 124  BILITOT 1.1  PROT 7.1  ALBUMIN 3.5   Recent Labs  Lab 04/19/23 1845  LIPASE 31   No results for input(s): "AMMONIA" in the last 168 hours. Coagulation Profile: Recent Labs  Lab 04/20/23 0348  INR 1.3*   Cardiac Enzymes: Recent Labs  Lab 04/20/23 0348  CKTOTAL 65   BNP (last 3 results) No results for input(s): "PROBNP" in the last 8760 hours. HbA1C: No results for input(s): "HGBA1C" in the last 72 hours. CBG: No results for input(s): "GLUCAP" in the last 168 hours. Lipid Profile: No results for input(s): "CHOL", "HDL", "LDLCALC", "TRIG", "CHOLHDL", "LDLDIRECT" in the last 72 hours. Thyroid Function Tests: Recent Labs    04/20/23 0348  TSH 1.120   Anemia Panel: No results for input(s): "VITAMINB12", "FOLATE", "FERRITIN", "TIBC", "IRON", "RETICCTPCT" in the last 72 hours. Sepsis Labs: No results for input(s): "PROCALCITON", "LATICACIDVEN" in the last 168 hours.  No results found for this or any previous visit (from the past 240 hour(s)).    Radiology Studies: MR [facility] RIGHT WO CONTRAST  Result Date: 04/20/2023 CLINICAL DATA:  Upper leg pain, stress fracture suspected. EXAM: MRI OF THE RIGHT FEMUR WITHOUT CONTRAST TECHNIQUE: Multiplanar, multisequence MR imaging of the right femur was performed. No intravenous contrast was administered. COMPARISON:  CT pelvis 04/19/2023,  radiographs of the right hip 04/19/2023 and 03/18/2023 and CT of the right hip 12/28/2021. FINDINGS: Bones/Joint/Cartilage Postsurgical changes from previous right total hip arthroplasty are again noted. Allowing for the artifact associated with the surgical hardware, no acute osseous findings are demonstrated. Specifically, no evidence of hardware loosening or femoral stress fracture. Chronic osseous fragment adjacent to the right greater trochanter and bridging osteophytes at the symphysis pubis are again noted. No evidence of significant hip or knee joint effusion. Ligaments No ligamentous abnormalities are identified. Muscles and Tendons Both thighs are included on the coronal images. Compared with the left thigh, there is mild generalized right thigh muscular atrophy. No muscular edema or focal fluid collection identified. The common hamstring tendons appear normal. The right quadriceps tendon appears normal. Soft tissues No soft tissue mass, fluid collection or unexpected foreign body identified within the right thigh. Prominent vascular calcifications, better demonstrated by CT. IMPRESSION: 1. No acute findings or explanation for the patient's symptoms. 2. Stable postsurgical changes from previous right total hip arthroplasty. No acute osseous findings. 3. Mild generalized right thigh muscular atrophy. Electronically Signed   By: Carey Bullocks M.D.   On: 04/20/2023 09:20   CT ABDOMEN PELVIS WO CONTRAST  Result Date: 04/19/2023 CLINICAL  DATA:  Bilious vomiting EXAM: CT ABDOMEN AND PELVIS WITHOUT CONTRAST TECHNIQUE: Multidetector CT imaging of the abdomen and pelvis was performed following the standard protocol without IV contrast. RADIATION DOSE REDUCTION: This exam was performed according to the departmental dose-optimization program which includes automated exposure control, adjustment of the mA and/or kV according to patient size and/or use of iterative reconstruction technique. COMPARISON:  CT  abdomen and pelvis 01/25/2015 FINDINGS: Lower chest: There is atelectasis in the lung bases. Hepatobiliary: No focal liver abnormality is seen. No gallstones, gallbladder wall thickening, or biliary dilatation. Pancreas: Unremarkable. No pancreatic ductal dilatation or surrounding inflammatory changes. Spleen: Normal in size without focal abnormality. Adrenals/Urinary Tract: Marked right renal atrophy is again noted. There is no hydronephrosis in either kidney. Bilateral renal cysts are present measuring up to 2.1 cm on the left. Adrenal glands and bladder are within normal limits. Stomach/Bowel: Stomach is within normal limits. Appendix appears normal. No evidence of bowel wall thickening, distention, or inflammatory changes. There is a small hiatal hernia. Partial small bowel malrotation again noted, congenital. Vascular/Lymphatic: Aortic atherosclerosis. No enlarged abdominal or pelvic lymph nodes. Reproductive: Prostate gland is enlarged. Other: There are small fat containing inguinal hernias. There is no ascites. Musculoskeletal: Right hip arthroplasty is present. IMPRESSION: 1. No acute localizing process in the abdomen or pelvis. 2. Small hiatal hernia. 3. Stable right renal atrophy. 4. Prostatomegaly. Aortic Atherosclerosis (ICD10-I70.0). Electronically Signed   By: Darliss Cheney M.D.   On: 04/19/2023 22:21   DG Abd 2 Views  Result Date: 04/19/2023 CLINICAL DATA:  Vomiting since 07/19. EXAM: ABDOMEN - 2 VIEW COMPARISON:  None Available. FINDINGS: Heart size is normal. Bowel gas pattern appears nonspecific. No dilated loops of small bowel noted. Mild gaseous distension of the colon is identified measuring up to 6.1 cm. Gas is noted up to the level of the distal rectum. Visualized portions of the lungs are clear. Heart size appears normal. Signs of previous right hip arthroplasty. IMPRESSION: Nonspecific bowel gas pattern. Gaseous distension of the colon is identified measuring up to 6.1 cm. Electronically  Signed   By: Signa Kell M.D.   On: 04/19/2023 17:36   MR LUMBAR SPINE WO CONTRAST  Result Date: 04/19/2023 CLINICAL DATA:  Lumbar radiculopathy, symptoms persist with greater than 6 weeks of treatment. Right leg pain and weakness. EXAM: MRI LUMBAR SPINE WITHOUT CONTRAST TECHNIQUE: Multiplanar, multisequence MR imaging of the lumbar spine was performed. No intravenous contrast was administered. COMPARISON:  Sign radiograph 04/19/2023. MRI of the lumbar spine 12/28/2021. FINDINGS: Segmentation: 5 non rib-bearing lumbar type vertebral bodies are present. The lowest fully formed vertebral body is L5. Alignment: Grade 1 anterolisthesis at L4-5 measures 4 mm, unchanged. No other significant listhesis is present. Lumbar lordosis is preserved. Vertebrae: The chronic type 2 Modic changes are present posteriorly at L4-5. Remote superior endplate fracture at T12 is stable. Marrow signal and vertebral body heights are otherwise normal. Conus medullaris and cauda equina: Conus extends to the L1 level. Conus and cauda equina appear normal. Paraspinal and other soft tissues: Chronic atrophy of the right kidney is again noted. A simple cyst at the upper pole measures 2.9 cm. Cystic lesions of the left kidney are incompletely imaged. Prompt renal protocol MRI or CT without and with contrast to better visualize. [employer] 2021; [id number], Bosniak Classification of Cystic Renal Masses, Version 2019. No significant adenopathy is present. No other solid organ lesions are present. Paraspinous musculature is within normal limits. Disc levels: T12-L1: Negative. L1-2: Mild disc bulging  is present. No significant stenosis is present. L2-3: Insert normal disc level L3-4: Mild disc bulging and facet hypertrophy is present. L4-5: Uncovering of a broad-based disc protrusion is present. Mild right subarticular narrowing has increased. Moderate right and mild left foraminal stenosis is similar the prior exam. Moderate facet hypertrophy  contributes bilaterally. L5-S1: A shallow central disc protrusion is again seen. No focal stenosis is associated. IMPRESSION: 1. Mild right subarticular narrowing at L4-5 has increased. 2. Moderate right and mild left foraminal stenosis at L4-5 is similar the prior exam. 3. Shallow central disc protrusion at L5-S1 without significant stenosis. 4. Mild disc bulging and facet hypertrophy at L1-2 and L3-4 without significant stenosis. Electronically Signed   By: Marin Roberts M.D.   On: 04/19/2023 14:40   DG Hip Unilat With Pelvis 2-3 Views Right  Result Date: 04/19/2023 CLINICAL DATA:  Right hip pain. EXAM: DG HIP (WITH OR WITHOUT PELVIS) 2-3V RIGHT COMPARISON:  March 18, 2023. FINDINGS: Status post right total hip arthroplasty. No acute fracture or dislocation is noted. Vascular calcifications are noted. IMPRESSION: No acute abnormality seen. Electronically Signed   By: Lupita Raider M.D.   On: 04/19/2023 11:43   DG Lumbar Spine Complete  Result Date: 04/19/2023 CLINICAL DATA:  Pain EXAM: LUMBAR SPINE - COMPLETE 4+ VIEW COMPARISON:  MR 12/28/2021. FINDINGS: Anterolisthesis L4 on L5 measures approximately 6-7 mm. No signs of acute fracture or subluxation. Stable mild anterior superior endplate compression deformity of the T12 vertebra. The lumbar vertebral body heights are well preserved. Multilevel disc space narrowing and endplate spurring is again noted which appears most severe at the L4-5 level. Mild facet arthropathy is identified at L4-5 and L5-S1. Signs of previous right hip arthroplasty. Extensive aortic atherosclerotic calcifications. IMPRESSION: 1. No acute findings. 2. Grade 1 anterolisthesis L4 on L5. 3. Multilevel degenerative disc disease and facet arthropathy. 4. Aortic atherosclerosis. Electronically Signed   By: Signa Kell M.D.   On: 04/19/2023 11:42    Scheduled Meds:  clopidogrel  75 mg Oral Daily   enoxaparin (LOVENOX) injection  40 mg Subcutaneous Q24H   pantoprazole  (PROTONIX) IV  40 mg Intravenous Daily   senna-docusate  1 tablet Oral BID   sodium chloride flush  3 mL Intravenous Q12H   Continuous Infusions:  cefTRIAXone (ROCEPHIN)  IV 200 mL/hr at 04/20/23 1500   dextrose 5% lactated ringers Stopped (04/20/23 1457)     LOS: 1 day   Time spent: 35 minutes   Lucus Lambertson Estill Cotta, MD [facility]  If 7PM-7AM, please contact night-coverage [email or web address] 04/20/2023, 3:30 PM

## 2023-04-21 ENCOUNTER — Ambulatory Visit (HOSPITAL_BASED_OUTPATIENT_CLINIC_OR_DEPARTMENT_OTHER): Payer: Medicare Other

## 2023-04-21 DIAGNOSIS — M79651 Pain in right thigh: Secondary | ICD-10-CM | POA: Diagnosis not present

## 2023-04-21 LAB — CBC
HCT: 39.9 % (ref 39.0–52.0)
Hemoglobin: 13.9 g/dL (ref 13.0–17.0)
MCH: 29.6 pg (ref 26.0–34.0)
MCHC: 34.8 g/dL (ref 30.0–36.0)
MCV: 85.1 fL (ref 80.0–100.0)
Platelets: 234 10*3/uL (ref 150–400)
RBC: 4.69 MIL/uL (ref 4.22–5.81)
RDW: 13.8 % (ref 11.5–15.5)
WBC: 14 10*3/uL — ABNORMAL HIGH (ref 4.0–10.5)
nRBC: 0 % (ref 0.0–0.2)

## 2023-04-21 LAB — BASIC METABOLIC PANEL
Anion gap: 7 (ref 5–15)
BUN: 8 mg/dL (ref 8–23)
CO2: 22 mmol/L (ref 22–32)
Calcium: 8.1 mg/dL — ABNORMAL LOW (ref 8.9–10.3)
Chloride: 95 mmol/L — ABNORMAL LOW (ref 98–111)
Creatinine, Ser: 0.93 mg/dL (ref 0.61–1.24)
GFR, Estimated: >60 mL/min (ref >60)
Glucose, Bld: 114 mg/dL — ABNORMAL HIGH (ref 70–99)
Potassium: 3.5 mmol/L (ref 3.5–5.1)
Sodium: 124 mmol/L — ABNORMAL LOW (ref 135–145)

## 2023-04-21 LAB — URINE CULTURE: Culture: 20000 — AB

## 2023-04-21 LAB — OSMOLALITY, URINE: Osmolality, Ur: 120 mOsm/kg — ABNORMAL LOW (ref 300–900)

## 2023-04-21 MED ORDER — CIPROFLOXACIN HCL 500 MG PO TABS
500.0000 mg | ORAL_TABLET | ORAL | Status: AC
  Administered 2023-04-21: 500 mg via ORAL
  Filled 2023-04-21: qty 1

## 2023-04-21 MED ORDER — CIPROFLOXACIN HCL 500 MG PO TABS
500.0000 mg | ORAL_TABLET | Freq: Two times a day (BID) | ORAL | Status: DC
  Administered 2023-04-21 – 2023-04-23 (×4): 500 mg via ORAL
  Filled 2023-04-21 (×4): qty 1

## 2023-04-21 NOTE — Progress Notes (Signed)
PROGRESS NOTE    Deverick Pruss  GMW:[id number] DOB: [birth year] DOA: 04/19/2023 PCP: Jeoffrey Massed, MD   Brief Narrative:  Antonio Serrano is a 77 y.o. male with medical history significant of several chronic medical issues such as coronary artery disease, insulin-dependent type 2 diabetes, hypertension, chronic hyponatremia, chronic ambulation dysfunction, cigar smoker, hyperlipidemia, CKD stage II patient reports that for the last 2 weeks he has had progressive right thigh araea pain that is present when he bears weight on the affected extremity.  Pain is not present with passive movement or when patient is not bearing weight.  Patient states that pain has gotten so bad that today basically patient is not able to ambulate because of pain.   In ER: Afebrile, tachycardic, BP: 131/87, maintaining oxygen saturation on room air.  White blood cell 24.3, H&H: 15.9/45.9, platelet: 288, ANA: 124, creatinine: 1.06, received IV fluids in the ED.  MRI of right femur obtained and patient admitted for further eval and management of right thigh pain generalized weakness/physical deconditioning..  Assessment & Plan:   Right thigh pain: -X-ray of  right hip: Negative for any acute findings. -MRI right femur negative for any acute findings. -PT recommended SNF.  TOC consulted  Enterobacter Cloacae UTI: -CT abdomen/pelvis negative for any acute findings.  Stable right renal atrophy, prostamegaly and small hiatal hernia. -Urine culture sensitive to Rocephin.  Will continue your Rocephin for 3 days -Leukocytosis improving.  Remained afebrile  Hypotonic Hyponatremia: -Appears to be chronic.  He is asymptomatic. -Low serum and urine osmolality and normal urine sodium.   DDD lumbar: -MRI lumbar spine showed multilevel degenerative disc disease and facet arthropathy -PT recommended SNF.  GERD: Continue PPI  CKD stage 3a: -Stable  Hyperlipidemia: Unable to tolerate statin per notes  TIA: Continue  Plavix  Chronic vertigo/dizziness: -MRI brain in June 2024 resulted negative for any acute findings. -His PCP referred patient to neurology. -Per patient his vertigo is better  Tobacco abuse: Counseled about cessation  Physical deconditioning/progressive generalized weakness: Chronic dizziness Recurrent falls -Await PT evaluation.  DVT prophylaxis: Lovenox Code Status: SCD  Family Communication: Patient's family present at bedside.  Plan of care discussed with patient in length and he verbalized understanding and agreed with it.  I discussed plan of care with patient and his family at the bedside and all verbalized understanding and agreed with it  Disposition Plan: SNF  Consultants:  None  Procedures:  None  Antimicrobials:  Rocephin  Status is: Inpatient    Subjective: Patient seen and examined.  Resting comfortably on bed.  Family at the bedside.  He is surprised that his  MRI of right femur resulted negative for any acute findings.  We discussed about his DDD changes in his lumbar spine.  He agreed with SNF placement per PT recommendations.  He denies any new complaints today.  Remained afebrile and no acute events overnight.  Objective: Vitals:   04/20/23 0551 04/20/23 1146 04/20/23 1943 04/21/23 0542  BP: (!) 148/89 (!) 162/76 (!) 164/79 (!) 166/77  Pulse: 78 69 68 63  Resp: 20 16 16 18   Temp: (!) 97.4 F (36.3 C) 97.7 F (36.5 C) 97.9 F (36.6 C) 97.7 F (36.5 C)  TempSrc: Oral Oral Oral Oral  SpO2: 99% 94% 98% 97%  Weight:      Height:        Intake/Output Summary (Last 24 hours) at 04/21/2023 1037 Last data filed at 04/21/2023 0944 Gross per 24 hour  Intake 2259 ml  Output 2275 ml  Net -16 ml   Filed Weights   04/19/23 1029  Weight: 67 kg    Examination:  General exam: Appears calm and comfortable, elderly male, on room air, communicating well, family at the bedside respiratory system: Clear to auscultation. Respiratory effort  normal. Cardiovascular system: S1 & S2 heard, RRR. No JVD, murmurs, rubs, gallops or clicks. No pedal edema. Gastrointestinal system: Abdomen is nondistended, soft and nontender. No organomegaly or masses felt. Normal bowel sounds heard. Central nervous system: Alert and oriented. No focal neurological deficits. Extremities: Symmetric 5 x 5 power. Skin: No rashes, lesions or ulcers Psychiatry: Judgement and insight appear normal. Mood & affect appropriate.    Data Reviewed: I have personally reviewed following labs and imaging studies  CBC: Recent Labs  Lab 04/19/23 1227 04/20/23 0348 04/21/23 0410  WBC 24.3* 20.8* 14.0*  NEUTROABS 21.3*  --   --   HGB 15.9 15.0 13.9  HCT 45.9 43.1 39.9  MCV 85.0 86.7 85.1  PLT 288 247 234   Basic Metabolic Panel: Recent Labs  Lab 04/19/23 1227 04/19/23 1845 04/20/23 0348 04/21/23 0410  NA 124* 125* 126* 124*  K 3.5 3.6 3.8 3.5  CL 92* 96* 94* 95*  CO2 21* 20* 24 22  GLUCOSE 154* 135* 126* 114*  BUN 11 10 9 8   CREATININE 1.06 1.09 1.09 0.93  CALCIUM 8.9 8.1* 8.6* 8.1*   GFR: Estimated Creatinine Clearance: 59.7 mL/min (by C-G formula based on SCr of 0.93 mg/dL). Liver Function Tests: Recent Labs  Lab 04/19/23 1227  AST 16  ALT 14  ALKPHOS 124  BILITOT 1.1  PROT 7.1  ALBUMIN 3.5   Recent Labs  Lab 04/19/23 1845  LIPASE 31   No results for input(s): "AMMONIA" in the last 168 hours. Coagulation Profile: Recent Labs  Lab 04/20/23 0348  INR 1.3*   Cardiac Enzymes: Recent Labs  Lab 04/20/23 0348  CKTOTAL 65   BNP (last 3 results) No results for input(s): "PROBNP" in the last 8760 hours. HbA1C: No results for input(s): "HGBA1C" in the last 72 hours. CBG: No results for input(s): "GLUCAP" in the last 168 hours. Lipid Profile: No results for input(s): "CHOL", "HDL", "LDLCALC", "TRIG", "CHOLHDL", "LDLDIRECT" in the last 72 hours. Thyroid Function Tests: Recent Labs    04/20/23 0348  TSH 1.120   Anemia  Panel: No results for input(s): "VITAMINB12", "FOLATE", "FERRITIN", "TIBC", "IRON", "RETICCTPCT" in the last 72 hours. Sepsis Labs: No results for input(s): "PROCALCITON", "LATICACIDVEN" in the last 168 hours.  Recent Results (from the past 240 hour(s))  Urine Culture     Status: Abnormal   Collection Time: 04/19/23  1:09 PM   Specimen: Urine, Clean Catch  Result Value Ref Range Status   Specimen Description   Final    URINE, CLEAN CATCH Performed at [facility], [address] [place], [place] [address]    Special Requests   Final    NONE Performed at [facility], [address], [place], [place] [address]    Culture 20,000 COLONIES/mL ENTEROBACTER CLOACAE (A)  Final   Report Status 04/21/2023 FINAL  Final   Organism ID, Bacteria ENTEROBACTER CLOACAE (A)  Final      Susceptibility   Enterobacter cloacae - MIC*    CEFEPIME <=0.12 SENSITIVE Sensitive     CIPROFLOXACIN <=0.25 SENSITIVE Sensitive     GENTAMICIN <=1 SENSITIVE Sensitive     IMIPENEM 0.5 SENSITIVE Sensitive     NITROFURANTOIN 32 SENSITIVE  Sensitive     TRIMETH/SULFA <=20 SENSITIVE Sensitive     PIP/TAZO >=128 RESISTANT Resistant     * 20,000 COLONIES/mL ENTEROBACTER CLOACAE      Radiology Studies: MR [facility] RIGHT WO CONTRAST  Result Date: 04/20/2023 CLINICAL DATA:  Upper leg pain, stress fracture suspected. EXAM: MRI OF THE RIGHT FEMUR WITHOUT CONTRAST TECHNIQUE: Multiplanar, multisequence MR imaging of the right femur was performed. No intravenous contrast was administered. COMPARISON:  CT pelvis 04/19/2023, radiographs of the right hip 04/19/2023 and 03/18/2023 and CT of the right hip 12/28/2021. FINDINGS: Bones/Joint/Cartilage Postsurgical changes from previous right total hip arthroplasty are again noted. Allowing for the artifact associated with the surgical hardware, no acute osseous findings are demonstrated. Specifically, no evidence of hardware loosening or femoral  stress fracture. Chronic osseous fragment adjacent to the right greater trochanter and bridging osteophytes at the symphysis pubis are again noted. No evidence of significant hip or knee joint effusion. Ligaments No ligamentous abnormalities are identified. Muscles and Tendons Both thighs are included on the coronal images. Compared with the left thigh, there is mild generalized right thigh muscular atrophy. No muscular edema or focal fluid collection identified. The common hamstring tendons appear normal. The right quadriceps tendon appears normal. Soft tissues No soft tissue mass, fluid collection or unexpected foreign body identified within the right thigh. Prominent vascular calcifications, better demonstrated by CT. IMPRESSION: 1. No acute findings or explanation for the patient's symptoms. 2. Stable postsurgical changes from previous right total hip arthroplasty. No acute osseous findings. 3. Mild generalized right thigh muscular atrophy. Electronically Signed   By: Carey Bullocks M.D.   On: 04/20/2023 09:20   CT ABDOMEN PELVIS WO CONTRAST  Result Date: 04/19/2023 CLINICAL DATA:  Bilious vomiting EXAM: CT ABDOMEN AND PELVIS WITHOUT CONTRAST TECHNIQUE: Multidetector CT imaging of the abdomen and pelvis was performed following the standard protocol without IV contrast. RADIATION DOSE REDUCTION: This exam was performed according to the departmental dose-optimization program which includes automated exposure control, adjustment of the mA and/or kV according to patient size and/or use of iterative reconstruction technique. COMPARISON:  CT abdomen and pelvis 01/25/2015 FINDINGS: Lower chest: There is atelectasis in the lung bases. Hepatobiliary: No focal liver abnormality is seen. No gallstones, gallbladder wall thickening, or biliary dilatation. Pancreas: Unremarkable. No pancreatic ductal dilatation or surrounding inflammatory changes. Spleen: Normal in size without focal abnormality. Adrenals/Urinary Tract:  Marked right renal atrophy is again noted. There is no hydronephrosis in either kidney. Bilateral renal cysts are present measuring up to 2.1 cm on the left. Adrenal glands and bladder are within normal limits. Stomach/Bowel: Stomach is within normal limits. Appendix appears normal. No evidence of bowel wall thickening, distention, or inflammatory changes. There is a small hiatal hernia. Partial small bowel malrotation again noted, congenital. Vascular/Lymphatic: Aortic atherosclerosis. No enlarged abdominal or pelvic lymph nodes. Reproductive: Prostate gland is enlarged. Other: There are small fat containing inguinal hernias. There is no ascites. Musculoskeletal: Right hip arthroplasty is present. IMPRESSION: 1. No acute localizing process in the abdomen or pelvis. 2. Small hiatal hernia. 3. Stable right renal atrophy. 4. Prostatomegaly. Aortic Atherosclerosis (ICD10-I70.0). Electronically Signed   By: Darliss Cheney M.D.   On: 04/19/2023 22:21   DG Abd 2 Views  Result Date: 04/19/2023 CLINICAL DATA:  Vomiting since 07/19. EXAM: ABDOMEN - 2 VIEW COMPARISON:  None Available. FINDINGS: Heart size is normal. Bowel gas pattern appears nonspecific. No dilated loops of small bowel noted. Mild gaseous distension of the colon is identified measuring up  to 6.1 cm. Gas is noted up to the level of the distal rectum. Visualized portions of the lungs are clear. Heart size appears normal. Signs of previous right hip arthroplasty. IMPRESSION: Nonspecific bowel gas pattern. Gaseous distension of the colon is identified measuring up to 6.1 cm. Electronically Signed   By: Signa Kell M.D.   On: 04/19/2023 17:36   MR LUMBAR SPINE WO CONTRAST  Result Date: 04/19/2023 CLINICAL DATA:  Lumbar radiculopathy, symptoms persist with greater than 6 weeks of treatment. Right leg pain and weakness. EXAM: MRI LUMBAR SPINE WITHOUT CONTRAST TECHNIQUE: Multiplanar, multisequence MR imaging of the lumbar spine was performed. No intravenous  contrast was administered. COMPARISON:  Sign radiograph 04/19/2023. MRI of the lumbar spine 12/28/2021. FINDINGS: Segmentation: 5 non rib-bearing lumbar type vertebral bodies are present. The lowest fully formed vertebral body is L5. Alignment: Grade 1 anterolisthesis at L4-5 measures 4 mm, unchanged. No other significant listhesis is present. Lumbar lordosis is preserved. Vertebrae: The chronic type 2 Modic changes are present posteriorly at L4-5. Remote superior endplate fracture at T12 is stable. Marrow signal and vertebral body heights are otherwise normal. Conus medullaris and cauda equina: Conus extends to the L1 level. Conus and cauda equina appear normal. Paraspinal and other soft tissues: Chronic atrophy of the right kidney is again noted. A simple cyst at the upper pole measures 2.9 cm. Cystic lesions of the left kidney are incompletely imaged. Prompt renal protocol MRI or CT without and with contrast to better visualize. [employer] 2021; [id number], Bosniak Classification of Cystic Renal Masses, Version 2019. No significant adenopathy is present. No other solid organ lesions are present. Paraspinous musculature is within normal limits. Disc levels: T12-L1: Negative. L1-2: Mild disc bulging is present. No significant stenosis is present. L2-3: Insert normal disc level L3-4: Mild disc bulging and facet hypertrophy is present. L4-5: Uncovering of a broad-based disc protrusion is present. Mild right subarticular narrowing has increased. Moderate right and mild left foraminal stenosis is similar the prior exam. Moderate facet hypertrophy contributes bilaterally. L5-S1: A shallow central disc protrusion is again seen. No focal stenosis is associated. IMPRESSION: 1. Mild right subarticular narrowing at L4-5 has increased. 2. Moderate right and mild left foraminal stenosis at L4-5 is similar the prior exam. 3. Shallow central disc protrusion at L5-S1 without significant stenosis. 4. Mild disc bulging and facet  hypertrophy at L1-2 and L3-4 without significant stenosis. Electronically Signed   By: Marin Roberts M.D.   On: 04/19/2023 14:40   DG Hip Unilat With Pelvis 2-3 Views Right  Result Date: 04/19/2023 CLINICAL DATA:  Right hip pain. EXAM: DG HIP (WITH OR WITHOUT PELVIS) 2-3V RIGHT COMPARISON:  March 18, 2023. FINDINGS: Status post right total hip arthroplasty. No acute fracture or dislocation is noted. Vascular calcifications are noted. IMPRESSION: No acute abnormality seen. Electronically Signed   By: Lupita Raider M.D.   On: 04/19/2023 11:43   DG Lumbar Spine Complete  Result Date: 04/19/2023 CLINICAL DATA:  Pain EXAM: LUMBAR SPINE - COMPLETE 4+ VIEW COMPARISON:  MR 12/28/2021. FINDINGS: Anterolisthesis L4 on L5 measures approximately 6-7 mm. No signs of acute fracture or subluxation. Stable mild anterior superior endplate compression deformity of the T12 vertebra. The lumbar vertebral body heights are well preserved. Multilevel disc space narrowing and endplate spurring is again noted which appears most severe at the L4-5 level. Mild facet arthropathy is identified at L4-5 and L5-S1. Signs of previous right hip arthroplasty. Extensive aortic atherosclerotic calcifications. IMPRESSION: 1. No acute findings. 2.  Grade 1 anterolisthesis L4 on L5. 3. Multilevel degenerative disc disease and facet arthropathy. 4. Aortic atherosclerosis. Electronically Signed   By: Signa Kell M.D.   On: 04/19/2023 11:42    Scheduled Meds:  clopidogrel  75 mg Oral Daily   enoxaparin (LOVENOX) injection  40 mg Subcutaneous Q24H   pantoprazole  40 mg Oral Daily   senna-docusate  1 tablet Oral BID   sodium chloride flush  3 mL Intravenous Q12H   Continuous Infusions:  cefTRIAXone (ROCEPHIN)  IV Stopped (04/20/23 1533)   dextrose 5% lactated ringers 75 mL/hr at 04/21/23 0423     LOS: 2 days   Time spent: 35 minutes   Billijo Dilling Estill Cotta, MD [facility]  If 7PM-7AM, please contact  night-coverage [email or web address] 04/21/2023, 10:37 AM

## 2023-04-21 NOTE — TOC Initial Note (Signed)
Transition of Care [facility]) - Initial/Assessment Note    Patient Details  Name: Antonio Serrano MRN: [id number] Date of Birth: 10/21/1945  [facility]) CM/SW Contact:    Otelia Santee, LCSW Phone Number: 04/21/2023, 3:14 PM  Clinical Narrative:                 Met with pt at bedside and confirmed plan for SNF placement at discharge. Pt shares he has not been to SNF before and currently does not have a preference for facility.  Referrals have been sent out and currently awaiting bed offers.  Expected Discharge Plan: Skilled Nursing Facility Barriers to Discharge: SNF Pending bed offer, Insurance Authorization   Patient Goals and CMS Choice Patient states their goals for this hospitalization and ongoing recovery are:: To go to rehab CMS Medicare.gov Compare Post Acute Care list provided to:: Patient Choice offered to / list presented to : Patient [place] ownership interest in [facility].provided to:: Patient    Expected Discharge Plan and Services In-house Referral: Clinical Social Work Discharge Planning Services: NA Post Acute Care Choice: Skilled Nursing Facility Living arrangements for the past 2 months: Mobile Home                 DME Arranged: N/A DME Agency: NA                  Prior Living Arrangements/Services Living arrangements for the past 2 months: Mobile Home Lives with:: Spouse Patient language and need for interpreter reviewed:: Yes Do you feel safe going back to the place where you live?: Yes      Need for Family Participation in Patient Care: No (Comment) Care giver support system in place?: No (comment) Current home services: DME Criminal Activity/Legal Involvement Pertinent to Current Situation/Hospitalization: No - Comment as needed  Activities of Daily Living Home Assistive Devices/Equipment: Environmental consultant (specify type), Wheelchair ADL Screening (condition at time of admission) Patient's cognitive ability adequate to safely  complete daily activities?: Yes Is the patient deaf or have difficulty hearing?: No Does the patient have difficulty seeing, even when wearing glasses/contacts?: No Does the patient have difficulty concentrating, remembering, or making decisions?: No Patient able to express need for assistance with ADLs?: Yes Does the patient have difficulty dressing or bathing?: Yes Independently performs ADLs?: No Communication: Independent Dressing (OT): Needs assistance Is this a change from baseline?: Change from baseline, expected to last >3 days Grooming: Needs assistance Is this a change from baseline?: Change from baseline, expected to last >3 days Feeding: Independent Toileting: Needs assistance Is this a change from baseline?: Change from baseline, expected to last >3days In/Out Bed: Needs assistance Is this a change from baseline?: Change from baseline, expected to last >3 days Walks in Home: Needs assistance Is this a change from baseline?: Change from baseline, expected to last >3 days Does the patient have difficulty walking or climbing stairs?: Yes Weakness of Legs: Both Weakness of Arms/Hands: Both  Permission Sought/Granted Permission sought to share information with : Facility Medical sales representative, Family Supports Permission granted to share information with : Yes, Verbal Permission Granted  Share Information with NAME: Antonio Serrano  Permission granted to share info w AGENCY: SNF's  Permission granted to share info w Relationship: Spouse  Permission granted to share info w Contact Information: [phone]  Emotional Assessment Appearance:: Appears stated age Attitude/Demeanor/Rapport: Engaged Affect (typically observed): Accepting Orientation: : Oriented to Self, Oriented to Place, Oriented to  Time, Oriented to Situation Alcohol /  Substance Use: Not Applicable Psych Involvement: No (comment)  Admission diagnosis:  Hyponatremia [E87.1] UTI (urinary tract infection)  [N39.0] Right leg pain [M79.604] Acute UTI [N39.0] Generalized weakness [R53.1] Lumbar degenerative disc disease [M51.36] Leukocytosis, unspecified type [D72.829] Patient Active Problem List   Diagnosis Date Noted   UTI (urinary tract infection) 04/19/2023   Thigh pain 04/19/2023   Vomiting 04/19/2023   TIA (transient ischemic attack) 01/04/2022   Hyponatremia    Closed right hip fracture, initial encounter (HCC) 04/23/2021   Diabetes mellitus with complication (HCC) 03/27/2016   Hyperlipidemia 07/29/2015   Conjunctivitis 06/27/2015   Healthcare maintenance 06/27/2015   Type 2 diabetes mellitus with stage 3a chronic kidney disease, without long-term current use of insulin (HCC) 07/16/2014   Constipation, chronic 07/16/2014   Preventative health care 05/18/2014   Testicular pain, right 06/25/2013   Erectile dysfunction 05/31/2013   Chronic renal insufficiency, stage III (moderate) (HCC)    HTN (hypertension), benign 01/30/2012   Tobacco dependence 01/30/2012   PCP:  Jeoffrey Massed, MD Pharmacy:   [facility] [address] ([place]),  - [address] [place] ([place]) [place] [address] Phone: [phone] Fax: [phone]     Social Determinants of Health (SDOH) Social History: SDOH Screenings   Food Insecurity: No Food Insecurity (04/20/2023)  Housing: Low Risk  (04/20/2023)  Transportation Needs: No Transportation Needs (04/20/2023)  Utilities: Not At Risk (04/20/2023)  Alcohol Screen: Low Risk  (11/16/2020)  Depression (PHQ2-9): High Risk (04/17/2023)  Financial Resource Strain: Low Risk  (11/28/2022)  Physical Activity: Insufficiently Active (11/28/2022)  Social Connections: Moderately Isolated (11/28/2022)  Stress: No Stress Concern Present (11/28/2022)  Tobacco Use: High Risk (04/19/2023)   SDOH Interventions:     Readmission Risk Interventions    04/21/2023    3:12 PM  Readmission Risk Prevention Plan  Transportation Screening Complete   PCP or Specialist Appt within 5-7 Days Complete  Home Care Screening Complete  Medication Review (RN CM) Complete

## 2023-04-21 NOTE — [place] FL2 (Signed)
[place] MEDICAID FL2 LEVEL OF CARE FORM     IDENTIFICATION  Patient Name: Antonio Serrano Birthdate: Aug 13, 1946 Sex: male Admission Date (Current Location): 04/19/2023  [facility] and [place] Number:  Producer, television/film/video and Address:  [facility],  [address] [place] [address]      Provider Number: [id number]  Attending Physician Name and Address:  Ollen Bowl, MD  Relative Name and Phone Number:       Current Level of Care: Hospital Recommended Level of Care: Skilled Nursing Facility Prior Approval Number:    Date Approved/Denied:   PASRR Number: [id number]  Discharge Plan: SNF    Current Diagnoses: Patient Active Problem List   Diagnosis Date Noted   UTI (urinary tract infection) 04/19/2023   Thigh pain 04/19/2023   Vomiting 04/19/2023   TIA (transient ischemic attack) 01/04/2022   Hyponatremia    Closed right hip fracture, initial encounter (HCC) 04/23/2021   Diabetes mellitus with complication (HCC) 03/27/2016   Hyperlipidemia 07/29/2015   Conjunctivitis 06/27/2015   Healthcare maintenance 06/27/2015   Type 2 diabetes mellitus with stage 3a chronic kidney disease, without long-term current use of insulin (HCC) 07/16/2014   Constipation, chronic 07/16/2014   Preventative health care 05/18/2014   Testicular pain, right 06/25/2013   Erectile dysfunction 05/31/2013   Chronic renal insufficiency, stage III (moderate) (HCC)    HTN (hypertension), benign 01/30/2012   Tobacco dependence 01/30/2012    Orientation RESPIRATION BLADDER Height & Weight     Self, Time, Situation, Place  Normal Incontinent Weight: 147 lb 11.3 oz (67 kg) Height:  5\' 5"  (165.1 cm)  BEHAVIORAL SYMPTOMS/MOOD NEUROLOGICAL BOWEL NUTRITION STATUS      Continent Diet (Regular)  AMBULATORY STATUS COMMUNICATION OF NEEDS Skin   Extensive Assist Verbally Normal                       Personal Care Assistance Level of Assistance  Bathing, Feeding, Dressing Bathing  Assistance: Limited assistance Feeding assistance: Independent Dressing Assistance: Limited assistance     Functional Limitations Info  Sight, Hearing, Speech Sight Info: Adequate Hearing Info: Adequate Speech Info: Adequate    SPECIAL CARE FACTORS FREQUENCY  PT (By licensed PT), OT (By licensed OT)     PT Frequency: 5x/wk OT Frequency: 5x/wk            Contractures Contractures Info: Not present    Additional Factors Info  Code Status, Allergies Code Status Info: FULL Allergies Info: Bactrim (Sulfamethoxazole-trimethoprim)           Current Medications (04/21/2023):  This is the current hospital active medication list Current Facility-Administered Medications  Medication Dose Route Frequency Provider Last Rate Last Admin   acetaminophen (TYLENOL) tablet 650 mg  650 mg Oral Q6H PRN Nolberto Hanlon, MD       Or   acetaminophen (TYLENOL) suppository 650 mg  650 mg Rectal Q6H PRN Nolberto Hanlon, MD       ciprofloxacin (CIPRO) tablet 500 mg  500 mg Oral BID Pahwani, Rinka R, MD       clopidogrel (PLAVIX) tablet 75 mg  75 mg Oral Daily Nolberto Hanlon, MD   75 mg at 04/21/23 0944   dextrose 5 % in lactated ringers infusion   Intravenous Continuous Nolberto Hanlon, MD 75 mL/hr at 04/21/23 1227 Infusion Verify at 04/21/23 1227   enoxaparin (LOVENOX) injection 40 mg  40 mg Subcutaneous Q24H Nolberto Hanlon, MD   40 mg at 04/21/23 [phone]  hydrALAZINE (APRESOLINE) injection 10 mg  10 mg Intravenous Q6H PRN Pahwani, Rinka R, MD       ondansetron (ZOFRAN) injection 4 mg  4 mg Intravenous Q6H PRN Nolberto Hanlon, MD       pantoprazole (PROTONIX) EC tablet 40 mg  40 mg Oral Daily Pahwani, Rinka R, MD   40 mg at 04/21/23 0944   polyethylene glycol (MIRALAX / GLYCOLAX) packet 17 g  17 g Oral Daily PRN Nolberto Hanlon, MD       senna-docusate (Senokot-S) tablet 1 tablet  1 tablet Oral BID Nolberto Hanlon, MD   1 tablet at 04/20/23 1024   sodium chloride flush (NS) 0.9 % injection 3 mL  3 mL Intravenous Q12H Nolberto Hanlon, MD   3 mL at 04/21/23 0944   traZODone (DESYREL) tablet 50 mg  50 mg Oral QHS PRN Nolberto Hanlon, MD         Discharge Medications: Please see discharge summary for a list of discharge medications.  Relevant Imaging Results:  Relevant Lab Results:   Additional Information SSN: [id number]  Otelia Santee, LCSW

## 2023-04-22 DIAGNOSIS — E871 Hypo-osmolality and hyponatremia: Secondary | ICD-10-CM | POA: Diagnosis not present

## 2023-04-22 DIAGNOSIS — M79651 Pain in right thigh: Secondary | ICD-10-CM | POA: Diagnosis not present

## 2023-04-22 DIAGNOSIS — N3000 Acute cystitis without hematuria: Secondary | ICD-10-CM | POA: Diagnosis not present

## 2023-04-22 LAB — BASIC METABOLIC PANEL
Anion gap: 7 (ref 5–15)
BUN: 7 mg/dL — ABNORMAL LOW (ref 8–23)
CO2: 25 mmol/L (ref 22–32)
Calcium: 8.2 mg/dL — ABNORMAL LOW (ref 8.9–10.3)
Chloride: 92 mmol/L — ABNORMAL LOW (ref 98–111)
Creatinine, Ser: 0.98 mg/dL (ref 0.61–1.24)
GFR, Estimated: >60 mL/min (ref >60)
Glucose, Bld: 102 mg/dL — ABNORMAL HIGH (ref 70–99)
Potassium: 3.6 mmol/L (ref 3.5–5.1)
Sodium: 124 mmol/L — ABNORMAL LOW (ref 135–145)

## 2023-04-22 LAB — CBC
HCT: 43 % (ref 39.0–52.0)
Hemoglobin: 14.6 g/dL (ref 13.0–17.0)
MCH: 28.9 pg (ref 26.0–34.0)
MCHC: 34 g/dL (ref 30.0–36.0)
MCV: 85.1 fL (ref 80.0–100.0)
Platelets: 257 10*3/uL (ref 150–400)
RBC: 5.05 MIL/uL (ref 4.22–5.81)
RDW: 13.6 % (ref 11.5–15.5)
WBC: 7.3 10*3/uL (ref 4.0–10.5)
nRBC: 0 % (ref 0.0–0.2)

## 2023-04-22 MED ORDER — ALUM & MAG HYDROXIDE-SIMETH 200-200-20 MG/5ML PO SUSP
15.0000 mL | Freq: Four times a day (QID) | ORAL | Status: DC | PRN
  Administered 2023-04-22: 15 mL via ORAL
  Filled 2023-04-22 (×2): qty 30

## 2023-04-22 MED ORDER — POTASSIUM CHLORIDE CRYS ER 20 MEQ PO TBCR
40.0000 meq | EXTENDED_RELEASE_TABLET | Freq: Once | ORAL | Status: AC
  Administered 2023-04-22: 40 meq via ORAL
  Filled 2023-04-22: qty 2

## 2023-04-22 MED ORDER — HYDRALAZINE HCL 20 MG/ML IJ SOLN
INTRAMUSCULAR | Status: AC
  Filled 2023-04-22: qty 1

## 2023-04-22 NOTE — Hospital Course (Signed)
Antonio Serrano is a 77 y.o. male with past medical history significant of several chronic medical issues such as coronary artery disease, insulin-dependent type 2 diabetes, hypertension, chronic hyponatremia, chronic ambulation dysfunction, cigar smoker, hyperlipidemia, CKD stage II presented to the hospital with 2-week history of right thigh pain and difficulty bearing weight on that extremity.  In the ED patient was tachycardic with leukocytosis at 24.3.  MRI of the right femur was obtained and patient was admitted hospital for further evaluation and treatment.    Assessment & Plan:   Right thigh pain: -X-ray of  right hip was negative for any acute findings during MRI of the right femur.  At this time physical therapy has seen the patient and recommended skilled nursing facility placement.  TOC has been consulted..  Enterobacter Cloacae UTI: -CT abdomen/pelvis negative for any acute findings.  Stable right renal atrophy, prostamegaly and small hiatal hernia.  Currently on ciprofloxacin.  Afebrile.   Hypotonic Hyponatremia: Chronic asymptomatic hyponatremia. Low serum and urine osmolality and normal urine sodium.    DDD lumbar spine.: -MRI lumbar spine showed multilevel degenerative disc disease and facet arthropathy. PT recommended SNF.   GERD: Continue PPI   CKD stage 3a: -Stable, closely monitor BMP.   Hyperlipidemia: Unable to tolerate statin as per history.   TIA: Continue Plavix   Chronic vertigo/dizziness: -MRI brain in June 2024 resulted negative for any acute findings. PCP has referred her to neurology for evaluation.   Tobacco abuse: S reemphasized quitting smoking.   Physical deconditioning/progressive generalized weakness: Chronic dizziness Recurrent falls Skilled therapy has recommended skilled nursing facility placement at this time.

## 2023-04-22 NOTE — TOC Progression Note (Signed)
Transition of Care [facility]) - Progression Note    Patient Details  Name: Antonio Serrano MRN: [id number] Date of Birth: 02-Dec-1945  [facility]) CM/SW Contact  Howell Rucks, RN Phone Number: 04/22/2023, 3:10 PM  Clinical Narrative:  Met with pt at bedside, presented with SNF  bed offers, pt reports he will review with his dtr. TOC will continue to follow.      Expected Discharge Plan: Skilled Nursing Facility Barriers to Discharge: SNF Pending bed offer, Insurance Authorization  Expected Discharge Plan and Services In-house Referral: Clinical Social Work Discharge Planning Services: NA Post Acute Care Choice: Skilled Nursing Facility Living arrangements for the past 2 months: Mobile Home                 DME Arranged: N/A DME Agency: NA                   Social Determinants of Health (SDOH) Interventions SDOH Screenings   Food Insecurity: No Food Insecurity (04/20/2023)  Housing: Low Risk  (04/20/2023)  Transportation Needs: No Transportation Needs (04/20/2023)  Utilities: Not At Risk (04/20/2023)  Alcohol Screen: Low Risk  (11/16/2020)  Depression (PHQ2-9): High Risk (04/17/2023)  Financial Resource Strain: Low Risk  (11/28/2022)  Physical Activity: Insufficiently Active (11/28/2022)  Social Connections: Moderately Isolated (11/28/2022)  Stress: No Stress Concern Present (11/28/2022)  Tobacco Use: High Risk (04/19/2023)    Readmission Risk Interventions    04/21/2023    3:12 PM  Readmission Risk Prevention Plan  Transportation Screening Complete  PCP or Specialist Appt within 5-7 Days Complete  Home Care Screening Complete  Medication Review (RN CM) Complete

## 2023-04-22 NOTE — Progress Notes (Signed)
PROGRESS NOTE    Antonio Serrano  ZOX:[id number] DOB: 11-Jun-1946 DOA: 04/19/2023 PCP: Jeoffrey Massed, MD    Brief Narrative:  Antonio Serrano is a 77 y.o. male with past medical history significant of several chronic medical issues such as coronary artery disease, insulin-dependent type 2 diabetes, hypertension, chronic hyponatremia, chronic ambulation dysfunction, cigar smoker, hyperlipidemia, CKD stage II presented to the hospital with 2-week history of right thigh pain and difficulty bearing weight on that extremity.  In the ED, patient was tachycardic with leukocytosis at 24.3.  MRI of the right femur was obtained and patient was admitted hospital for further evaluation and treatment.    Assessment & Plan:   Right thigh pain: -X-ray of  right hip was negative for any acute findings including MRI of the right femur.  At this time physical therapy has seen the patient and recommended skilled nursing facility placement.  TOC has been consulted. Denies further pain.  Enterobacter Cloacae UTI: -CT abdomen/pelvis negative for any acute findings.  Stable right renal atrophy, prostamegaly and small hiatal hernia.  Currently on ciprofloxacin.  Afebrile.  Denies overt urinary symptoms.   Hypotonic Hyponatremia: Chronic asymptomatic hyponatremia. Low serum and urine osmolality and normal urine sodium.  Will continue to monitor.   DDD lumbar spine.: -MRI lumbar spine showed multilevel degenerative disc disease and facet arthropathy. PT recommended SNF.   GERD: Continue PPI   CKD stage 3a: -Stable, closely monitor BMP.   Hyperlipidemia: Unable to tolerate statin as per history.   TIA: Continue Plavix   Chronic vertigo/dizziness: -MRI brain in June 2024 resulted negative for any acute findings. PCP has referred her to neurology for evaluation.  Complains of mild lightheadedness.   Tobacco abuse:  reemphasized quitting smoking.   Physical deconditioning/progressive generalized  weakness: Chronic dizziness Recurrent falls Skilled therapy has recommended skilled nursing facility placement at this time.       DVT prophylaxis: enoxaparin (LOVENOX) injection 40 mg Start: 04/20/23 0800 SCDs Start: 04/19/23 1611   Code Status:     Code Status: Full Code  Disposition: Skilled nursing facility when bed available.  Patient is medically stable for disposition.  Status is: Inpatient  Remains inpatient appropriate because: Need for rehabilitation,   Family Communication: Spoke with the patient's stepdaughter at bedside  Consultants:  None  Procedures:  None  Antimicrobials:  Ciprofloxacin  Anti-infectives (From admission, onward)    Start     Dose/Rate Route Frequency Ordered Stop   04/21/23 2000  ciprofloxacin (CIPRO) tablet 500 mg        500 mg Oral 2 times daily 04/21/23 1336 04/26/23 0759   04/21/23 1430  ciprofloxacin (CIPRO) tablet 500 mg        500 mg Oral NOW 04/21/23 1337 04/21/23 1439   04/20/23 1500  cefTRIAXone (ROCEPHIN) 1 g in sodium chloride 0.9 % 100 mL IVPB  Status:  Discontinued        1 g 200 mL/hr over 30 Minutes Intravenous Every 24 hours 04/19/23 1638 04/21/23 1336   04/19/23 1445  cefTRIAXone (ROCEPHIN) 1 g in sodium chloride 0.9 % 100 mL IVPB        1 g 200 mL/hr over 30 Minutes Intravenous  Once 04/19/23 1441 04/19/23 1529        Subjective: Today, patient was seen and examined at bedside.  Patient states that he feels a little lightheaded and had walked some with physical therapy.  Denies any pain and tenderness on the leg.  Patient's stepdaughter at bedside.  Objective: Vitals:   04/21/23 1217 04/21/23 2131 04/22/23 0534 04/22/23 0821  BP: (!) 154/82 (!) 169/82 (!) 168/90 (!) 131/90  Pulse: 62 75 62 79  Resp: 16 18    Temp: 97.9 F (36.6 C) 99.1 F (37.3 C) 98 F (36.7 C)   TempSrc: Oral Oral Oral   SpO2: 98% 97% 97% 94%  Weight:      Height:        Intake/Output Summary (Last 24 hours) at 04/22/2023 1109 Last  data filed at 04/22/2023 0900 Gross per 24 hour  Intake 2060.32 ml  Output 2450 ml  Net -389.68 ml   Filed Weights   04/19/23 1029  Weight: 67 kg    Physical Examination: Body mass index is 24.58 kg/m.  General:  Average built, not in obvious distress, elderly male, HENT:   No scleral pallor or icterus noted. Oral mucosa is moist.  Chest:  Clear breath sounds.  Diminished breath sounds bilaterally. No crackles or wheezes.  CVS: S1 &S2 heard. No murmur.  Regular rate and rhythm. Abdomen: Soft, nontender, nondistended.  Bowel sounds are heard.   Extremities: No cyanosis, clubbing or edema.  Peripheral pulses are palpable.  Nontender Psych: Alert, awake and oriented, normal mood CNS:  No cranial nerve deficits.  Power equal in all extremities.  Generalized weakness noted Skin: Warm and dry.  No rashes noted.  Data Reviewed:   CBC: Recent Labs  Lab 04/19/23 1227 04/20/23 0348 04/21/23 0410 04/22/23 0322  WBC 24.3* 20.8* 14.0* 7.3  NEUTROABS 21.3*  --   --   --   HGB 15.9 15.0 13.9 14.6  HCT 45.9 43.1 39.9 43.0  MCV 85.0 86.7 85.1 85.1  PLT 288 247 234 257    Basic Metabolic Panel: Recent Labs  Lab 04/19/23 1227 04/19/23 1845 04/20/23 0348 04/21/23 0410 04/22/23 0322  NA 124* 125* 126* 124* 124*  K 3.5 3.6 3.8 3.5 3.6  CL 92* 96* 94* 95* 92*  CO2 21* 20* 24 22 25   GLUCOSE 154* 135* 126* 114* 102*  BUN 11 10 9 8  7*  CREATININE 1.06 1.09 1.09 0.93 0.98  CALCIUM 8.9 8.1* 8.6* 8.1* 8.2*    Liver Function Tests: Recent Labs  Lab 04/19/23 1227  AST 16  ALT 14  ALKPHOS 124  BILITOT 1.1  PROT 7.1  ALBUMIN 3.5     Radiology Studies: No results found.    LOS: 3 days    Joycelyn Das, MD [facility] Available via Epic secure chat 7am-7pm After these hours, please refer to coverage provider listed on [email or web address] 04/22/2023, 11:09 AM

## 2023-04-22 NOTE — Progress Notes (Signed)
Physical Therapy Treatment Patient Details Name: Antonio Serrano MRN: [id number] DOB: 10-May-1946 Today's Date: 04/22/2023   History of Present Illness 77 y.o. male admitted with progressive right thigh pain that is present when he bears weight on the affected extremity. Pain is not present with passive movement or when patient is not bearing weight. Pt also c/o N/V. xray right femur negative; MRI L spine Shallow central disc protrusion at L5-S1 without significant  stenosis.  4. Mild disc bulging and facet hypertrophy at L1-2 and L3-4 without  significant stenosis.      PMH: CKD, HTN, HLD, HTN, DM, R THA 2022.    PT Comments  Pt agreeable to working with therapy. Mobility is improving but pt continues to require +2 for safe mobility. He remains at risk for falls. He c/o dizziness during session on today. Continue to recommend f/u therapy after this hospital stay, if pt is agreeable. Patient will benefit from continued inpatient follow up therapy, <3 hours/day .      Assistance Recommended at Discharge Frequent or constant Supervision/Assistance  If plan is discharge home, recommend the following:  Can travel by private vehicle    A lot of help with walking and/or transfers;A lot of help with bathing/dressing/bathroom;Assistance with cooking/housework;Help with stairs or ramp for entrance;Assist for transportation   No  Equipment Recommendations  None recommended by PT    Recommendations for Other Services       Precautions / Restrictions Precautions Precautions: Fall Restrictions Weight Bearing Restrictions: No     Mobility  Bed Mobility Overal bed mobility: Needs Assistance Bed Mobility: Supine to Sit     Supine to sit: Mod assist, HOB elevated     General bed mobility comments: Assist for trunk and to scoot to EOB. Increased time. Cues provided. Increased time and effort.    Transfers Overall transfer level: Needs assistance Equipment used: Rolling walker (2  wheels) Transfers: Sit to/from Stand Sit to Stand: Min assist, +2 physical assistance, +2 safety/equipment, From elevated surface           General transfer comment: Assist to power up, steady, control descent. Cues for safety, technique, hand/feet placement. Increased time.    Ambulation/Gait Ambulation/Gait assistance: Min assist, +2 safety/equipment Gait Distance (Feet): 15 Feet Assistive device: Rolling walker (2 wheels) Gait Pattern/deviations: Step-to pattern       General Gait Details: Cues for safety, posture, and for pt to try to increase step lengths bilaterally. Ambulation distance limited by pt c/o dizziness. Followed with recliner and used it to transport pt back to room.   Stairs             Wheelchair Mobility     Tilt Bed    Modified Rankin (Stroke Patients Only)       Balance Overall balance assessment: Needs assistance, History of Falls         Standing balance support: Bilateral upper extremity supported, During functional activity, Reliant on assistive device for balance Standing balance-Leahy Scale: Poor                              Cognition Arousal/Alertness: Awake/alert Behavior During Therapy: WFL for tasks assessed/performed Overall Cognitive Status: Within Functional Limits for tasks assessed  Exercises      General Comments        Pertinent Vitals/Pain Pain Assessment Pain Assessment: Faces Faces Pain Scale: Hurts a little bit Pain Location: leg(s) Pain Descriptors / Indicators: Discomfort Pain Intervention(s): Monitored during session    Home Living                          Prior Function            PT Goals (current goals can now be found in the care plan section) Progress towards PT goals: Progressing toward goals    Frequency    Min 1X/week      PT Plan Current plan remains appropriate    Co-evaluation               AM-PAC PT "6 Clicks" Mobility   Outcome Measure  Help needed turning from your back to your side while in a flat bed without using bedrails?: A Little Help needed moving from lying on your back to sitting on the side of a flat bed without using bedrails?: A Lot Help needed moving to and from a bed to a chair (including a wheelchair)?: A Little Help needed standing up from a chair using your arms (e.g., wheelchair or bedside chair)?: A Little Help needed to walk in hospital room?: A Lot Help needed climbing 3-5 steps with a railing? : Total 6 Click Score: 14    End of Session Equipment Utilized During Treatment: Gait belt Activity Tolerance: Patient tolerated treatment well Patient left: in chair;with call bell/phone within reach;with chair alarm set   PT Visit Diagnosis: Other abnormalities of gait and mobility (R26.89);Difficulty in walking, not elsewhere classified (R26.2)     Time: 2956-2130 PT Time Calculation (min) (ACUTE ONLY): 13 min  Charges:    $Gait Training: 8-22 mins PT General Charges $$ ACUTE PT VISIT: 1 Visit                        Faye Ramsay, PT Acute Rehabilitation  Office: [phone]

## 2023-04-23 DIAGNOSIS — E785 Hyperlipidemia, unspecified: Secondary | ICD-10-CM | POA: Diagnosis not present

## 2023-04-23 DIAGNOSIS — G8929 Other chronic pain: Secondary | ICD-10-CM | POA: Diagnosis not present

## 2023-04-23 DIAGNOSIS — E1169 Type 2 diabetes mellitus with other specified complication: Secondary | ICD-10-CM | POA: Diagnosis not present

## 2023-04-23 DIAGNOSIS — N179 Acute kidney failure, unspecified: Secondary | ICD-10-CM | POA: Diagnosis not present

## 2023-04-23 DIAGNOSIS — R112 Nausea with vomiting, unspecified: Secondary | ICD-10-CM | POA: Diagnosis not present

## 2023-04-23 DIAGNOSIS — M5136 Other intervertebral disc degeneration, lumbar region: Secondary | ICD-10-CM | POA: Diagnosis not present

## 2023-04-23 DIAGNOSIS — R42 Dizziness and giddiness: Secondary | ICD-10-CM | POA: Diagnosis not present

## 2023-04-23 DIAGNOSIS — R531 Weakness: Secondary | ICD-10-CM | POA: Diagnosis not present

## 2023-04-23 DIAGNOSIS — E871 Hypo-osmolality and hyponatremia: Secondary | ICD-10-CM | POA: Diagnosis not present

## 2023-04-23 DIAGNOSIS — H814 Vertigo of central origin: Secondary | ICD-10-CM | POA: Diagnosis not present

## 2023-04-23 DIAGNOSIS — R799 Abnormal finding of blood chemistry, unspecified: Secondary | ICD-10-CM | POA: Diagnosis not present

## 2023-04-23 DIAGNOSIS — R2681 Unsteadiness on feet: Secondary | ICD-10-CM | POA: Diagnosis not present

## 2023-04-23 DIAGNOSIS — M79651 Pain in right thigh: Secondary | ICD-10-CM | POA: Diagnosis not present

## 2023-04-23 DIAGNOSIS — E1122 Type 2 diabetes mellitus with diabetic chronic kidney disease: Secondary | ICD-10-CM | POA: Diagnosis not present

## 2023-04-23 DIAGNOSIS — I959 Hypotension, unspecified: Secondary | ICD-10-CM | POA: Diagnosis not present

## 2023-04-23 DIAGNOSIS — G629 Polyneuropathy, unspecified: Secondary | ICD-10-CM | POA: Diagnosis not present

## 2023-04-23 DIAGNOSIS — I2510 Atherosclerotic heart disease of native coronary artery without angina pectoris: Secondary | ICD-10-CM | POA: Diagnosis not present

## 2023-04-23 DIAGNOSIS — M6281 Muscle weakness (generalized): Secondary | ICD-10-CM | POA: Diagnosis not present

## 2023-04-23 DIAGNOSIS — N390 Urinary tract infection, site not specified: Secondary | ICD-10-CM | POA: Diagnosis not present

## 2023-04-23 DIAGNOSIS — R1310 Dysphagia, unspecified: Secondary | ICD-10-CM | POA: Diagnosis not present

## 2023-04-23 DIAGNOSIS — N183 Chronic kidney disease, stage 3 unspecified: Secondary | ICD-10-CM | POA: Diagnosis not present

## 2023-04-23 DIAGNOSIS — N3000 Acute cystitis without hematuria: Secondary | ICD-10-CM | POA: Diagnosis not present

## 2023-04-23 DIAGNOSIS — Z7401 Bed confinement status: Secondary | ICD-10-CM | POA: Diagnosis not present

## 2023-04-23 DIAGNOSIS — I129 Hypertensive chronic kidney disease with stage 1 through stage 4 chronic kidney disease, or unspecified chronic kidney disease: Secondary | ICD-10-CM | POA: Diagnosis not present

## 2023-04-23 DIAGNOSIS — R41841 Cognitive communication deficit: Secondary | ICD-10-CM | POA: Diagnosis not present

## 2023-04-23 MED ORDER — SODIUM CHLORIDE 1 G PO TABS
1.0000 g | ORAL_TABLET | Freq: Three times a day (TID) | ORAL | Status: DC
  Administered 2023-04-23: 1 g via ORAL
  Filled 2023-04-23: qty 1

## 2023-04-23 MED ORDER — SODIUM CHLORIDE 1 G PO TABS: 1.0000 g | ORAL_TABLET | Freq: Two times a day (BID) | ORAL | Status: AC

## 2023-04-23 MED ORDER — POLYETHYLENE GLYCOL 3350 17 G PO PACK: 17.0000 g | PACK | Freq: Every day | ORAL | Status: DC | PRN

## 2023-04-23 MED ORDER — CIPROFLOXACIN HCL 500 MG PO TABS: 500.0000 mg | ORAL_TABLET | Freq: Two times a day (BID) | ORAL | Status: AC

## 2023-04-23 NOTE — TOC Progression Note (Addendum)
Transition of Care [facility] - Progression Note    Patient Details  Name: Antonio Serrano MRN: [id number] Date of Birth: 09/28/1946  Transition of Care [facility]) CM/SW Contact  Howell Rucks, RN Phone Number: 04/23/2023, 12:11 PM  Clinical Narrative:  Met with pt at bedside, confirmed [facility] for short term rehab-SNF. Will initiate insurance auth.    -1:03pm Insurance auth initiated via Reola Calkins ID: [id number]. Days Approved: 04/23/2023-04/25/2023     Expected Discharge Plan: Skilled Nursing Facility Barriers to Discharge: SNF Pending bed offer, Insurance Authorization  Expected Discharge Plan and Services In-house Referral: Clinical Social Work Discharge Planning Services: NA Post Acute Care Choice: Skilled Nursing Facility Living arrangements for the past 2 months: Mobile Home                 DME Arranged: N/A DME Agency: NA                   Social Determinants of Health (SDOH) Interventions SDOH Screenings   Food Insecurity: No Food Insecurity (04/20/2023)  Housing: Low Risk  (04/20/2023)  Transportation Needs: No Transportation Needs (04/20/2023)  Utilities: Not At Risk (04/20/2023)  Alcohol Screen: Low Risk  (11/16/2020)  Depression (PHQ2-9): High Risk (04/17/2023)  Financial Resource Strain: Low Risk  (11/28/2022)  Physical Activity: Insufficiently Active (11/28/2022)  Social Connections: Moderately Isolated (11/28/2022)  Stress: No Stress Concern Present (11/28/2022)  Tobacco Use: High Risk (04/19/2023)    Readmission Risk Interventions    04/21/2023    3:12 PM  Readmission Risk Prevention Plan  Transportation Screening Complete  PCP or Specialist Appt within 5-7 Days Complete  Home Care Screening Complete  Medication Review (RN CM) Complete

## 2023-04-23 NOTE — Progress Notes (Signed)
Physical Therapy Treatment Patient Details Name: Antonio Serrano MRN: [id number] DOB: 09-Jun-1946 Today's Date: 04/23/2023   History of Present Illness 77 y.o. male admitted with progressive right thigh pain that is present when he bears weight on the affected extremity. Pain is not present with passive movement or when patient is not bearing weight. Pt also c/o N/V. xray right femur negative; MRI L spine Shallow central disc protrusion at L5-S1 without significant  stenosis.  4. Mild disc bulging and facet hypertrophy at L1-2 and L3-4 without  significant stenosis.      PMH: CKD, HTN, HLD, HTN, DM, R THA 2022.    PT Comments  Pt agreeable to working with therapy. Min A for bed mobility. Increased difficulty with sit to stand transitions and gait. High fall risk. Assisted pt into recliner and encouraged him to sit up as tolerated. Patient will benefit from continued inpatient follow up therapy, <3 hours/day.     Assistance Recommended at Discharge Frequent or constant Supervision/Assistance  If plan is discharge home, recommend the following:  Can travel by private vehicle    A lot of help with walking and/or transfers;A lot of help with bathing/dressing/bathroom;Assistance with cooking/housework;Help with stairs or ramp for entrance;Assist for transportation   No  Equipment Recommendations  None recommended by PT    Recommendations for Other Services       Precautions / Restrictions Precautions Precautions: Fall Restrictions Weight Bearing Restrictions: No     Mobility  Bed Mobility Overal bed mobility: Needs Assistance Bed Mobility: Supine to Sit     Supine to sit: Min assist, HOB elevated     General bed mobility comments: Min A to get EOB on today. Increased time + cues required.    Transfers Overall transfer level: Needs assistance Equipment used: Rolling walker (2 wheels) Transfers: Sit to/from Stand Sit to Stand: Mod assist, From elevated surface            General transfer comment: Assist to power up, steady, control descent. Cues for safety, technique, hand/feet placement. Increased time. x2-posterior bias on 1st stand with pt losing balance and uncontrolled descent back to bed.    Ambulation/Gait Ambulation/Gait assistance: Min assist;+2 safety/equipment Gait Distance (Feet): 3 Feet Assistive device: Rolling walker (2 wheels) Gait Pattern/deviations: Step-to pattern       General Gait Details: Cues for safety, posture, and for pt to try to increase step lengths bilaterally. Ambulation distance limited on today. Poor balance + pt had difficulty advancing LEs. Deferred further ambulation for safety reasons/fall risk and assisted pt into recliner.   Stairs             Wheelchair Mobility     Tilt Bed    Modified Rankin (Stroke Patients Only)       Balance Overall balance assessment: Needs assistance, History of Falls         Standing balance support: Bilateral upper extremity supported, During functional activity, Reliant on assistive device for balance Standing balance-Leahy Scale: Poor                              Cognition Arousal/Alertness: Awake/alert Behavior During Therapy: WFL for tasks assessed/performed Overall Cognitive Status: Impaired/Different from baseline Area of Impairment: Problem solving                             Problem Solving: Slow processing, Difficulty sequencing, Requires verbal cues  Exercises      General Comments        Pertinent Vitals/Pain Pain Assessment Pain Assessment: No/denies pain    Home Living                          Prior Function            PT Goals (current goals can now be found in the care plan section) Progress towards PT goals: Progressing toward goals    Frequency    Min 1X/week      PT Plan Current plan remains appropriate    Co-evaluation              AM-PAC PT "6 Clicks" Mobility    Outcome Measure  Help needed turning from your back to your side while in a flat bed without using bedrails?: A Little Help needed moving from lying on your back to sitting on the side of a flat bed without using bedrails?: A Little Help needed moving to and from a bed to a chair (including a wheelchair)?: A Lot Help needed standing up from a chair using your arms (e.g., wheelchair or bedside chair)?: A Lot Help needed to walk in hospital room?: A Lot Help needed climbing 3-5 steps with a railing? : Total 6 Click Score: 13    End of Session Equipment Utilized During Treatment: Gait belt Activity Tolerance: Patient tolerated treatment well Patient left: in chair;with call bell/phone within reach;with family/visitor present;with chair alarm set   PT Visit Diagnosis: Other abnormalities of gait and mobility (R26.89);Difficulty in walking, not elsewhere classified (R26.2)     Time: 3244-0102 PT Time Calculation (min) (ACUTE ONLY): 11 min  Charges:    $Gait Training: 8-22 mins PT General Charges $$ ACUTE PT VISIT: 1 Visit                         Faye Ramsay, PT Acute Rehabilitation  Office: [phone]

## 2023-04-23 NOTE — Discharge Summary (Signed)
Physician Discharge Summary  Antonio Serrano VWU:[id number] DOB: 1946/01/23 DOA: 04/19/2023  PCP: Jeoffrey Massed, MD  Admit date: 04/19/2023 Discharge date: 04/23/2023  Admitted From: Home  Discharge disposition: Skilled nursing facility   Recommendations for Outpatient Follow-Up:   Follow up with your primary care provider in one week.  Check CBC, BMP, magnesium in the next visit  Discharge Diagnosis:   Principal Problem:   Thigh pain Active Problems:   Hyponatremia   UTI (urinary tract infection)   Vomiting  Discharge Condition: Improved.  Diet recommendation: Regular.  Wound care: None.  Code status: Full.   History of Present Illness:   Antonio Serrano is a 77 y.o. male with past medical history significant of several chronic medical issues such as coronary artery disease, insulin-dependent type 2 diabetes, hypertension, chronic hyponatremia, chronic ambulation dysfunction, cigar smoker, hyperlipidemia, CKD stage II presented to the hospital with 2-week history of right thigh pain and difficulty bearing weight on that extremity.  In the ED, patient was tachycardic with leukocytosis at 24.3.  MRI of the right femur was obtained and patient was admitted hospital for further evaluation and treatment.    Hospital Course:   Following conditions were addressed during hospitalization as listed below,  Assessment & Plan:   Right thigh pain: No further pain.  X-ray of  right hip was negative for any acute findings including MRI of the right femur.  At this time physical therapy has seen the patient and recommended skilled nursing facility placement.     Enterobacter Cloacae UTI: CT abdomen/pelvis negative for any acute findings.  Stable right renal atrophy, prostamegaly and small hiatal hernia.  Currently on ciprofloxacin to complete 5-day course.  Continue Cipro for 2 more days.  Afebrile.  Denies overt urinary symptoms.   Hypotonic Hyponatremia: Chronic asymptomatic  hyponatremia. Low serum and urine osmolality and normal urine sodium.  Will continue to monitor.  Will add sodium tablets for the next 2 weeks on discharge.  Liberalize diet with regular diet..   DDD lumbar spine.: -MRI lumbar spine showed multilevel degenerative disc disease and facet arthropathy. PT recommended SNF.   GERD: Continue PPI   CKD stage 3a: -Stable, closely monitor BMP.  Latest creatinine of 0.9.   Hyperlipidemia: Unable to tolerate statin as per history.   TIA: Continue Plavix   Chronic vertigo/dizziness: -MRI brain in June 2024 resulted negative for any acute findings. PCP has referred her to neurology for evaluation.    Tobacco abuse:  reemphasized quitting smoking.   Physical deconditioning/progressive generalized weakness: Chronic dizziness Recurrent falls Physical therapy has recommended skilled nursing facility placement at this time.  Disposition.  At this time, patient is stable for disposition to skilled nursing facility.  Communicated with the patient's stepdaughter at bedside.  Medical Consultants:   None.  Procedures:    none Subjective:   Today, patient was seen and examined at bedside.  Patient denies interval complaints.  Complains of mild indigestion otherwise no other complaints.  Denies any pain.  Patient's stepdaughter at bedside.     Discharge Exam:   Vitals:   04/22/23 2025 04/23/23 0618  BP: 134/79 (!) 150/81  Pulse: 70 75  Resp: 16 16  Temp: 98.2 F (36.8 C) 97.9 F (36.6 C)  SpO2: 96% 98%   Vitals:   04/22/23 0821 04/22/23 1359 04/22/23 2025 04/23/23 0618  BP: (!) 131/90 133/83 134/79 (!) 150/81  Pulse: 79 94 70 75  Resp:  (!) 23 16 16   Temp:  98 F (  36.7 C) 98.2 F (36.8 C) 97.9 F (36.6 C)  TempSrc:  Oral Oral Oral  SpO2: 94% 98% 96% 98%  Weight:      Height:       General:  Average built, not in obvious distress, elderly male, HENT:   No scleral pallor or icterus noted. Oral mucosa is moist.  Chest:  Clear  breath sounds.   No crackles or wheezes.  CVS: S1 &S2 heard. No murmur.  Regular rate and rhythm. Abdomen: Soft, nontender, nondistended.  Bowel sounds are heard.   Extremities: No cyanosis, clubbing or edema.  Peripheral pulses are palpable.  Nontender Psych: Alert, awake and oriented, normal mood CNS:  No cranial nerve deficits. generalized weakness noted Skin: Warm and dry.  No rashes noted   The results of significant diagnostics from this hospitalization (including imaging, microbiology, ancillary and laboratory) are listed below for reference.     Diagnostic Studies:   MR [facility] RIGHT WO CONTRAST  Result Date: 04/20/2023 CLINICAL DATA:  Upper leg pain, stress fracture suspected. EXAM: MRI OF THE RIGHT FEMUR WITHOUT CONTRAST TECHNIQUE: Multiplanar, multisequence MR imaging of the right femur was performed. No intravenous contrast was administered. COMPARISON:  CT pelvis 04/19/2023, radiographs of the right hip 04/19/2023 and 03/18/2023 and CT of the right hip 12/28/2021. FINDINGS: Bones/Joint/Cartilage Postsurgical changes from previous right total hip arthroplasty are again noted. Allowing for the artifact associated with the surgical hardware, no acute osseous findings are demonstrated. Specifically, no evidence of hardware loosening or femoral stress fracture. Chronic osseous fragment adjacent to the right greater trochanter and bridging osteophytes at the symphysis pubis are again noted. No evidence of significant hip or knee joint effusion. Ligaments No ligamentous abnormalities are identified. Muscles and Tendons Both thighs are included on the coronal images. Compared with the left thigh, there is mild generalized right thigh muscular atrophy. No muscular edema or focal fluid collection identified. The common hamstring tendons appear normal. The right quadriceps tendon appears normal. Soft tissues No soft tissue mass, fluid collection or unexpected foreign body identified within the right  thigh. Prominent vascular calcifications, better demonstrated by CT. IMPRESSION: 1. No acute findings or explanation for the patient's symptoms. 2. Stable postsurgical changes from previous right total hip arthroplasty. No acute osseous findings. 3. Mild generalized right thigh muscular atrophy. Electronically Signed   By: Carey Bullocks M.D.   On: 04/20/2023 09:20   CT ABDOMEN PELVIS WO CONTRAST  Result Date: 04/19/2023 CLINICAL DATA:  Bilious vomiting EXAM: CT ABDOMEN AND PELVIS WITHOUT CONTRAST TECHNIQUE: Multidetector CT imaging of the abdomen and pelvis was performed following the standard protocol without IV contrast. RADIATION DOSE REDUCTION: This exam was performed according to the departmental dose-optimization program which includes automated exposure control, adjustment of the mA and/or kV according to patient size and/or use of iterative reconstruction technique. COMPARISON:  CT abdomen and pelvis 01/25/2015 FINDINGS: Lower chest: There is atelectasis in the lung bases. Hepatobiliary: No focal liver abnormality is seen. No gallstones, gallbladder wall thickening, or biliary dilatation. Pancreas: Unremarkable. No pancreatic ductal dilatation or surrounding inflammatory changes. Spleen: Normal in size without focal abnormality. Adrenals/Urinary Tract: Marked right renal atrophy is again noted. There is no hydronephrosis in either kidney. Bilateral renal cysts are present measuring up to 2.1 cm on the left. Adrenal glands and bladder are within normal limits. Stomach/Bowel: Stomach is within normal limits. Appendix appears normal. No evidence of bowel wall thickening, distention, or inflammatory changes. There is a small hiatal hernia. Partial small bowel malrotation  again noted, congenital. Vascular/Lymphatic: Aortic atherosclerosis. No enlarged abdominal or pelvic lymph nodes. Reproductive: Prostate gland is enlarged. Other: There are small fat containing inguinal hernias. There is no ascites.  Musculoskeletal: Right hip arthroplasty is present. IMPRESSION: 1. No acute localizing process in the abdomen or pelvis. 2. Small hiatal hernia. 3. Stable right renal atrophy. 4. Prostatomegaly. Aortic Atherosclerosis (ICD10-I70.0). Electronically Signed   By: Darliss Cheney M.D.   On: 04/19/2023 22:21   DG Abd 2 Views  Result Date: 04/19/2023 CLINICAL DATA:  Vomiting since 07/19. EXAM: ABDOMEN - 2 VIEW COMPARISON:  None Available. FINDINGS: Heart size is normal. Bowel gas pattern appears nonspecific. No dilated loops of small bowel noted. Mild gaseous distension of the colon is identified measuring up to 6.1 cm. Gas is noted up to the level of the distal rectum. Visualized portions of the lungs are clear. Heart size appears normal. Signs of previous right hip arthroplasty. IMPRESSION: Nonspecific bowel gas pattern. Gaseous distension of the colon is identified measuring up to 6.1 cm. Electronically Signed   By: Signa Kell M.D.   On: 04/19/2023 17:36   MR LUMBAR SPINE WO CONTRAST  Result Date: 04/19/2023 CLINICAL DATA:  Lumbar radiculopathy, symptoms persist with greater than 6 weeks of treatment. Right leg pain and weakness. EXAM: MRI LUMBAR SPINE WITHOUT CONTRAST TECHNIQUE: Multiplanar, multisequence MR imaging of the lumbar spine was performed. No intravenous contrast was administered. COMPARISON:  Sign radiograph 04/19/2023. MRI of the lumbar spine 12/28/2021. FINDINGS: Segmentation: 5 non rib-bearing lumbar type vertebral bodies are present. The lowest fully formed vertebral body is L5. Alignment: Grade 1 anterolisthesis at L4-5 measures 4 mm, unchanged. No other significant listhesis is present. Lumbar lordosis is preserved. Vertebrae: The chronic type 2 Modic changes are present posteriorly at L4-5. Remote superior endplate fracture at T12 is stable. Marrow signal and vertebral body heights are otherwise normal. Conus medullaris and cauda equina: Conus extends to the L1 level. Conus and cauda  equina appear normal. Paraspinal and other soft tissues: Chronic atrophy of the right kidney is again noted. A simple cyst at the upper pole measures 2.9 cm. Cystic lesions of the left kidney are incompletely imaged. Prompt renal protocol MRI or CT without and with contrast to better visualize. [employer] 2021; [id number], Bosniak Classification of Cystic Renal Masses, Version 2019. No significant adenopathy is present. No other solid organ lesions are present. Paraspinous musculature is within normal limits. Disc levels: T12-L1: Negative. L1-2: Mild disc bulging is present. No significant stenosis is present. L2-3: Insert normal disc level L3-4: Mild disc bulging and facet hypertrophy is present. L4-5: Uncovering of a broad-based disc protrusion is present. Mild right subarticular narrowing has increased. Moderate right and mild left foraminal stenosis is similar the prior exam. Moderate facet hypertrophy contributes bilaterally. L5-S1: A shallow central disc protrusion is again seen. No focal stenosis is associated. IMPRESSION: 1. Mild right subarticular narrowing at L4-5 has increased. 2. Moderate right and mild left foraminal stenosis at L4-5 is similar the prior exam. 3. Shallow central disc protrusion at L5-S1 without significant stenosis. 4. Mild disc bulging and facet hypertrophy at L1-2 and L3-4 without significant stenosis. Electronically Signed   By: Marin Roberts M.D.   On: 04/19/2023 14:40   DG Hip Unilat With Pelvis 2-3 Views Right  Result Date: 04/19/2023 CLINICAL DATA:  Right hip pain. EXAM: DG HIP (WITH OR WITHOUT PELVIS) 2-3V RIGHT COMPARISON:  March 18, 2023. FINDINGS: Status post right total hip arthroplasty. No acute fracture or dislocation is noted.  Vascular calcifications are noted. IMPRESSION: No acute abnormality seen. Electronically Signed   By: Lupita Raider M.D.   On: 04/19/2023 11:43   DG Lumbar Spine Complete  Result Date: 04/19/2023 CLINICAL DATA:  Pain EXAM: LUMBAR  SPINE - COMPLETE 4+ VIEW COMPARISON:  MR 12/28/2021. FINDINGS: Anterolisthesis L4 on L5 measures approximately 6-7 mm. No signs of acute fracture or subluxation. Stable mild anterior superior endplate compression deformity of the T12 vertebra. The lumbar vertebral body heights are well preserved. Multilevel disc space narrowing and endplate spurring is again noted which appears most severe at the L4-5 level. Mild facet arthropathy is identified at L4-5 and L5-S1. Signs of previous right hip arthroplasty. Extensive aortic atherosclerotic calcifications. IMPRESSION: 1. No acute findings. 2. Grade 1 anterolisthesis L4 on L5. 3. Multilevel degenerative disc disease and facet arthropathy. 4. Aortic atherosclerosis. Electronically Signed   By: Signa Kell M.D.   On: 04/19/2023 11:42     Labs:   Basic Metabolic Panel: Recent Labs  Lab 04/19/23 1227 04/19/23 1845 04/20/23 0348 04/21/23 0410 04/22/23 0322  NA 124* 125* 126* 124* 124*  K 3.5 3.6 3.8 3.5 3.6  CL 92* 96* 94* 95* 92*  CO2 21* 20* 24 22 25   GLUCOSE 154* 135* 126* 114* 102*  BUN 11 10 9 8  7*  CREATININE 1.06 1.09 1.09 0.93 0.98  CALCIUM 8.9 8.1* 8.6* 8.1* 8.2*   GFR Estimated Creatinine Clearance: 56.7 mL/min (by C-G formula based on SCr of 0.98 mg/dL). Liver Function Tests: Recent Labs  Lab 04/19/23 1227  AST 16  ALT 14  ALKPHOS 124  BILITOT 1.1  PROT 7.1  ALBUMIN 3.5   Recent Labs  Lab 04/19/23 1845  LIPASE 31   No results for input(s): "AMMONIA" in the last 168 hours. Coagulation profile Recent Labs  Lab 04/20/23 0348  INR 1.3*    CBC: Recent Labs  Lab 04/19/23 1227 04/20/23 0348 04/21/23 0410 04/22/23 0322  WBC 24.3* 20.8* 14.0* 7.3  NEUTROABS 21.3*  --   --   --   HGB 15.9 15.0 13.9 14.6  HCT 45.9 43.1 39.9 43.0  MCV 85.0 86.7 85.1 85.1  PLT 288 247 234 257   Cardiac Enzymes: Recent Labs  Lab 04/20/23 0348  CKTOTAL 65   BNP: Invalid input(s): "POCBNP" CBG: No results for input(s):  "GLUCAP" in the last 168 hours. D-Dimer No results for input(s): "DDIMER" in the last 72 hours. Hgb A1c No results for input(s): "HGBA1C" in the last 72 hours. Lipid Profile No results for input(s): "CHOL", "HDL", "LDLCALC", "TRIG", "CHOLHDL", "LDLDIRECT" in the last 72 hours. Thyroid function studies No results for input(s): "TSH", "T4TOTAL", "T3FREE", "THYROIDAB" in the last 72 hours.  Invalid input(s): "FREET3" Anemia work up No results for input(s): "VITAMINB12", "FOLATE", "FERRITIN", "TIBC", "IRON", "RETICCTPCT" in the last 72 hours. Microbiology Recent Results (from the past 240 hour(s))  Urine Culture     Status: Abnormal   Collection Time: 04/19/23  1:09 PM   Specimen: Urine, Clean Catch  Result Value Ref Range Status   Specimen Description   Final    URINE, CLEAN CATCH Performed at [facility], [address], [place], [place] [address]    Special Requests   Final    NONE Performed at [facility], [address], [place], [place] [address]    Culture 20,000 COLONIES/mL ENTEROBACTER CLOACAE (A)  Final   Report Status 04/21/2023 FINAL  Final   Organism ID, Bacteria ENTEROBACTER CLOACAE (A)  Final      Susceptibility   Enterobacter cloacae - MIC*    CEFEPIME <=0.12 SENSITIVE Sensitive     CIPROFLOXACIN <=0.25 SENSITIVE Sensitive     GENTAMICIN <=1 SENSITIVE Sensitive     IMIPENEM 0.5 SENSITIVE Sensitive     NITROFURANTOIN 32 SENSITIVE Sensitive     TRIMETH/SULFA <=20 SENSITIVE Sensitive     PIP/TAZO >=128 RESISTANT Resistant     * 20,000 COLONIES/mL ENTEROBACTER CLOACAE     Discharge Instructions:   Discharge Instructions     Diet general   Complete by: As directed    Discharge instructions   Complete by: As directed    Follow-up with your primary care provider at the skilled nursing facility in 3 to 5 days.  Check blood work at that time.  Seek medical attention for worsening symptoms.   Increase activity slowly    Complete by: As directed       Allergies as of 04/23/2023       Reactions   Bactrim [sulfamethoxazole-trimethoprim] Nausea Only        Medication List     STOP taking these medications    amoxicillin-clavulanate 875-125 MG tablet Commonly known as: AUGMENTIN       TAKE these medications    acetaminophen 500 MG tablet Commonly known as: TYLENOL Take 1,000 mg by mouth as needed for moderate pain.   ciprofloxacin 500 MG tablet Commonly known as: CIPRO Take 1 tablet (500 mg total) by mouth 2 (two) times daily for 2 days.   clopidogrel 75 MG tablet Commonly known as: PLAVIX Take 1 tablet by mouth once daily   ipratropium 0.03 % nasal spray Commonly known as: ATROVENT USE 2 SPRAY(S) IN EACH NOSTRIL EVERY 12 HOURS What changed:  how much to take how to take this when to take this reasons to take this additional instructions   OVER THE COUNTER MEDICATION Take 1 tablet by mouth daily. Elderberry, zinc, and vitamin C combo medication OTC.   pantoprazole 40 MG tablet Commonly known as: PROTONIX Take 1 tablet by mouth once daily   polyethylene glycol 17 g packet Commonly known as: MIRALAX / GLYCOLAX Take 17 g by mouth daily as needed for mild constipation.   sodium chloride 1 g tablet Take 1 tablet (1 g total) by mouth 2 (two) times daily with a meal for 14 days.   STOOL SOFTENER PO Take 1 tablet by mouth daily as needed (constipation).   traZODone 50 MG tablet Commonly known as: DESYREL TAKE 1 TABLET BY MOUTH EVERY DAY AT BEDTIME AS NEEDED FOR SLEEP What changed:  how much to take how to take this when to take this reasons to take this additional instructions        Contact information for after-discharge care     Destination     [facility] Preferred SNF .   Service: Skilled Nursing Contact information: [address] [place] [address] [phone]                      Time coordinating discharge: 39 minutes  Signed:  Shadoe Cryan  [facility] 04/23/2023, 1:40 PM

## 2023-04-23 NOTE — Progress Notes (Signed)
PROGRESS NOTE    Antonio Serrano  QQV:[id number] DOB: 1946/05/08 DOA: 04/19/2023 PCP: Jeoffrey Massed, MD    Brief Narrative:   Antonio Serrano is a 77 y.o. male with past medical history significant of several chronic medical issues such as coronary artery disease, insulin-dependent type 2 diabetes, hypertension, chronic hyponatremia, chronic ambulation dysfunction, cigar smoker, hyperlipidemia, CKD stage II presented to the hospital with 2-week history of right thigh pain and difficulty bearing weight on that extremity.  In the ED, patient was tachycardic with leukocytosis at 24.3.  MRI of the right femur was obtained and patient was admitted hospital for further evaluation and treatment.   At this time, patient is stable and is awaiting for skilled nursing facility placement.  Assessment & Plan:   Right thigh pain: No further pain.  X-ray of  right hip was negative for any acute findings including MRI of the right femur.  At this time physical therapy has seen the patient and recommended skilled nursing facility placement.    Enterobacter Cloacae UTI: CT abdomen/pelvis negative for any acute findings.  Stable right renal atrophy, prostamegaly and small hiatal hernia.  Currently on ciprofloxacin to complete the course..  Afebrile.  Denies overt urinary symptoms.   Hypotonic Hyponatremia: Chronic asymptomatic hyponatremia. Low serum and urine osmolality and normal urine sodium.  Will continue to monitor.  Will add sodium tablets.   DDD lumbar spine.: -MRI lumbar spine showed multilevel degenerative disc disease and facet arthropathy. PT recommended SNF.   GERD: Continue PPI   CKD stage 3a: -Stable, closely monitor BMP.  Latest creatinine of 0.9.   Hyperlipidemia: Unable to tolerate statin as per history.   TIA: Continue Plavix   Chronic vertigo/dizziness: -MRI brain in June 2024 resulted negative for any acute findings. PCP has referred her to neurology for evaluation.    Tobacco  abuse:  reemphasized quitting smoking.   Physical deconditioning/progressive generalized weakness: Chronic dizziness Recurrent falls Physical therapy has recommended skilled nursing facility placement at this time.       DVT prophylaxis: enoxaparin (LOVENOX) injection 40 mg Start: 04/20/23 0800 SCDs Start: 04/19/23 1611   Code Status:     Code Status: Full Code  Disposition: Skilled nursing facility when bed available.  Patient is medically stable for disposition.  Status is: Inpatient  Remains inpatient appropriate because: Need for rehabilitation,   Family Communication: Spoke with the patient's stepdaughter at bedside on 04/22/2023  Consultants:  None  Procedures:  None  Antimicrobials:  Ciprofloxacin  Anti-infectives (From admission, onward)    Start     Dose/Rate Route Frequency Ordered Stop   04/21/23 2000  ciprofloxacin (CIPRO) tablet 500 mg        500 mg Oral 2 times daily 04/21/23 1336 04/26/23 0759   04/21/23 1430  ciprofloxacin (CIPRO) tablet 500 mg        500 mg Oral NOW 04/21/23 1337 04/21/23 1439   04/20/23 1500  cefTRIAXone (ROCEPHIN) 1 g in sodium chloride 0.9 % 100 mL IVPB  Status:  Discontinued        1 g 200 mL/hr over 30 Minutes Intravenous Every 24 hours 04/19/23 1638 04/21/23 1336   04/19/23 1445  cefTRIAXone (ROCEPHIN) 1 g in sodium chloride 0.9 % 100 mL IVPB        1 g 200 mL/hr over 30 Minutes Intravenous  Once 04/19/23 1441 04/19/23 1529       Subjective: Today, patient was seen and examined at bedside.  Patient denies interval complaints.  Complains  of mild indigestion otherwise no other complaints.  Denies any pain.  Patient's stepdaughter at bedside.   Objective: Vitals:   04/22/23 0821 04/22/23 1359 04/22/23 2025 04/23/23 0618  BP: (!) 131/90 133/83 134/79 (!) 150/81  Pulse: 79 94 70 75  Resp:  (!) 23 16 16   Temp:  98 F (36.7 C) 98.2 F (36.8 C) 97.9 F (36.6 C)  TempSrc:  Oral Oral Oral  SpO2: 94% 98% 96% 98%  Weight:       Height:        Intake/Output Summary (Last 24 hours) at 04/23/2023 1059 Last data filed at 04/23/2023 1610 Gross per 24 hour  Intake 240 ml  Output 1125 ml  Net -885 ml   Filed Weights   04/19/23 1029  Weight: 67 kg    Physical Examination: Body mass index is 24.58 kg/m.   General:  Average built, not in obvious distress, elderly male, HENT:   No scleral pallor or icterus noted. Oral mucosa is moist.  Chest:  Clear breath sounds.   No crackles or wheezes.  CVS: S1 &S2 heard. No murmur.  Regular rate and rhythm. Abdomen: Soft, nontender, nondistended.  Bowel sounds are heard.   Extremities: No cyanosis, clubbing or edema.  Peripheral pulses are palpable.  Nontender Psych: Alert, awake and oriented, normal mood CNS:  No cranial nerve deficits. generalized weakness noted Skin: Warm and dry.  No rashes noted.  Data Reviewed:   CBC: Recent Labs  Lab 04/19/23 1227 04/20/23 0348 04/21/23 0410 04/22/23 0322  WBC 24.3* 20.8* 14.0* 7.3  NEUTROABS 21.3*  --   --   --   HGB 15.9 15.0 13.9 14.6  HCT 45.9 43.1 39.9 43.0  MCV 85.0 86.7 85.1 85.1  PLT 288 247 234 257    Basic Metabolic Panel: Recent Labs  Lab 04/19/23 1227 04/19/23 1845 04/20/23 0348 04/21/23 0410 04/22/23 0322  NA 124* 125* 126* 124* 124*  K 3.5 3.6 3.8 3.5 3.6  CL 92* 96* 94* 95* 92*  CO2 21* 20* 24 22 25   GLUCOSE 154* 135* 126* 114* 102*  BUN 11 10 9 8  7*  CREATININE 1.06 1.09 1.09 0.93 0.98  CALCIUM 8.9 8.1* 8.6* 8.1* 8.2*    Liver Function Tests: Recent Labs  Lab 04/19/23 1227  AST 16  ALT 14  ALKPHOS 124  BILITOT 1.1  PROT 7.1  ALBUMIN 3.5     Radiology Studies: No results found.    LOS: 4 days    Joycelyn Das, MD [facility] Available via Epic secure chat 7am-7pm After these hours, please refer to coverage provider listed on [email or web address] 04/23/2023, 10:59 AM

## 2023-04-23 NOTE — TOC Transition Note (Addendum)
Transition of Care [facility]) - CM/SW Discharge Note   Patient Details  Name: Antonio Serrano MRN: [id number] Date of Birth: Oct 25, 1945  [facility]) CM/SW Contact:  Howell Rucks, RN Phone Number: 04/23/2023, 2:27 PM   Clinical Narrative:  Patient to DC to [facility] today, RM 37, Nurse Call Report [phone], pt's dtr [name]) notified, pt notified. PTAR for transport. No further TOC needs identified.      Final next level of care: Skilled Nursing Facility Barriers to Discharge: Barriers Resolved   Patient Goals and CMS Choice CMS Medicare.gov Compare Post Acute Care list provided to:: Patient Choice offered to / list presented to : Patient  Discharge Placement                Patient chooses bed at: [facility] Patient to be transferred to facility by: PTAR Name of family member notified: [name] (dtr) Patient and family notified of of transfer: 04/23/23  Discharge Plan and Services Additional resources added to the After Visit Summary for   In-house Referral: Clinical Social Work Discharge Planning Services: NA Post Acute Care Choice: Skilled Nursing Facility          DME Arranged: N/A DME Agency: NA                  Social Determinants of Health (SDOH) Interventions SDOH Screenings   Food Insecurity: No Food Insecurity (04/20/2023)  Housing: Low Risk  (04/20/2023)  Transportation Needs: No Transportation Needs (04/20/2023)  Utilities: Not At Risk (04/20/2023)  Alcohol Screen: Low Risk  (11/16/2020)  Depression (PHQ2-9): High Risk (04/17/2023)  Financial Resource Strain: Low Risk  (11/28/2022)  Physical Activity: Insufficiently Active (11/28/2022)  Social Connections: Moderately Isolated (11/28/2022)  Stress: No Stress Concern Present (11/28/2022)  Tobacco Use: High Risk (04/19/2023)     Readmission Risk Interventions    04/21/2023    3:12 PM  Readmission Risk Prevention Plan  Transportation Screening Complete  PCP or Specialist Appt  within 5-7 Days Complete  Home Care Screening Complete  Medication Review (RN CM) Complete

## 2023-04-23 NOTE — Plan of Care (Signed)

## 2023-04-24 DIAGNOSIS — E871 Hypo-osmolality and hyponatremia: Secondary | ICD-10-CM | POA: Diagnosis not present

## 2023-04-24 DIAGNOSIS — G629 Polyneuropathy, unspecified: Secondary | ICD-10-CM | POA: Diagnosis not present

## 2023-04-24 DIAGNOSIS — N390 Urinary tract infection, site not specified: Secondary | ICD-10-CM | POA: Diagnosis not present

## 2023-04-24 DIAGNOSIS — M79651 Pain in right thigh: Secondary | ICD-10-CM | POA: Diagnosis not present

## 2023-04-25 ENCOUNTER — Other Ambulatory Visit: Payer: Self-pay

## 2023-04-25 ENCOUNTER — Encounter (HOSPITAL_COMMUNITY): Payer: Self-pay

## 2023-04-25 ENCOUNTER — Emergency Department (HOSPITAL_COMMUNITY)
Admission: EM | Admit: 2023-04-25 | Discharge: 2023-04-26 | Disposition: A | Discharge status: Home / Self Care | Payer: Medicare Other | Attending: Emergency Medicine | Admitting: Emergency Medicine

## 2023-04-25 DIAGNOSIS — E1122 Type 2 diabetes mellitus with diabetic chronic kidney disease: Secondary | ICD-10-CM | POA: Insufficient documentation

## 2023-04-25 DIAGNOSIS — I129 Hypertensive chronic kidney disease with stage 1 through stage 4 chronic kidney disease, or unspecified chronic kidney disease: Secondary | ICD-10-CM | POA: Insufficient documentation

## 2023-04-25 DIAGNOSIS — N179 Acute kidney failure, unspecified: Secondary | ICD-10-CM | POA: Diagnosis not present

## 2023-04-25 DIAGNOSIS — M79651 Pain in right thigh: Secondary | ICD-10-CM | POA: Diagnosis not present

## 2023-04-25 DIAGNOSIS — G629 Polyneuropathy, unspecified: Secondary | ICD-10-CM | POA: Diagnosis not present

## 2023-04-25 DIAGNOSIS — N183 Chronic kidney disease, stage 3 unspecified: Secondary | ICD-10-CM | POA: Diagnosis not present

## 2023-04-25 DIAGNOSIS — R799 Abnormal finding of blood chemistry, unspecified: Secondary | ICD-10-CM | POA: Diagnosis not present

## 2023-04-25 DIAGNOSIS — E871 Hypo-osmolality and hyponatremia: Secondary | ICD-10-CM | POA: Insufficient documentation

## 2023-04-25 DIAGNOSIS — N390 Urinary tract infection, site not specified: Secondary | ICD-10-CM | POA: Diagnosis not present

## 2023-04-25 LAB — COMPREHENSIVE METABOLIC PANEL
ALT: 35 U/L (ref 0–44)
AST: 25 U/L (ref 15–41)
Albumin: 3.1 g/dL — ABNORMAL LOW (ref 3.5–5.0)
Alkaline Phosphatase: 101 U/L (ref 38–126)
Anion gap: 11 (ref 5–15)
BUN: 15 mg/dL (ref 8–23)
CO2: 22 mmol/L (ref 22–32)
Calcium: 8.5 mg/dL — ABNORMAL LOW (ref 8.9–10.3)
Chloride: 91 mmol/L — ABNORMAL LOW (ref 98–111)
Creatinine, Ser: 1.35 mg/dL — ABNORMAL HIGH (ref 0.61–1.24)
GFR, Estimated: 55 mL/min — ABNORMAL LOW (ref >60)
Glucose, Bld: 163 mg/dL — ABNORMAL HIGH (ref 70–99)
Potassium: 4.3 mmol/L (ref 3.5–5.1)
Sodium: 124 mmol/L — ABNORMAL LOW (ref 135–145)
Total Bilirubin: 0.5 mg/dL (ref 0.3–1.2)
Total Protein: 6.5 g/dL (ref 6.5–8.1)

## 2023-04-25 LAB — CBC WITH DIFFERENTIAL/PLATELET
Abs Immature Granulocytes: 0.21 10*3/uL — ABNORMAL HIGH (ref 0.00–0.07)
Basophils Absolute: 0 10*3/uL (ref 0.0–0.1)
Basophils Relative: 1 %
Eosinophils Absolute: 0.1 10*3/uL (ref 0.0–0.5)
Eosinophils Relative: 1 %
HCT: 45.1 % (ref 39.0–52.0)
Hemoglobin: 15.3 g/dL (ref 13.0–17.0)
Immature Granulocytes: 3 %
Lymphocytes Relative: 26 %
Lymphs Abs: 1.6 10*3/uL (ref 0.7–4.0)
MCH: 29.3 pg (ref 26.0–34.0)
MCHC: 33.9 g/dL (ref 30.0–36.0)
MCV: 86.4 fL (ref 80.0–100.0)
Monocytes Absolute: 0.6 10*3/uL (ref 0.1–1.0)
Monocytes Relative: 10 %
Neutro Abs: 3.7 10*3/uL (ref 1.7–7.7)
Neutrophils Relative %: 59 %
Platelets: 317 10*3/uL (ref 150–400)
RBC: 5.22 MIL/uL (ref 4.22–5.81)
RDW: 13.8 % (ref 11.5–15.5)
WBC: 6.3 10*3/uL (ref 4.0–10.5)
nRBC: 0 % (ref 0.0–0.2)

## 2023-04-25 LAB — URINALYSIS, ROUTINE W REFLEX MICROSCOPIC
Bacteria, UA: NONE SEEN
Bilirubin Urine: NEGATIVE
Glucose, UA: >=500 mg/dL — AB
Hgb urine dipstick: NEGATIVE
Ketones, ur: NEGATIVE mg/dL
Leukocytes,Ua: NEGATIVE
Nitrite: NEGATIVE
Protein, ur: NEGATIVE mg/dL
Specific Gravity, Urine: 1.006 (ref 1.005–1.030)
pH: 6 (ref 5.0–8.0)

## 2023-04-25 MED ORDER — LACTATED RINGERS IV BOLUS
1000.0000 mL | Freq: Once | INTRAVENOUS | Status: AC
  Administered 2023-04-25: 1000 mL via INTRAVENOUS

## 2023-04-25 NOTE — ED Provider Notes (Signed)
[place] EMERGENCY DEPARTMENT AT [facility] Provider Note   CSN: [id number] Arrival date & time: 04/25/23  1843     History HTN,CKD,DM Chief Complaint  Antonio Serrano presents with   Abnormal Lab    Other Antonio Serrano is a 77 y.o. male.  77 year old male with a past medical history of HTN, DM, CKDIII presents to the ED for abnormal labs.  Antonio Serrano reports Antonio Serrano was discharged from the hospital 2 days ago, had lab work today which Antonio Serrano reportedly had a low potassium along with a low calcium.  Antonio Serrano is currently at rehabilitation in [facility] for ongoing right leg pain along with back pain.  Antonio Serrano otherwise lives at home with his wife.  Antonio Serrano is ambulatory at baseline.  Antonio Serrano denies any alcohol use.  Antonio Serrano is currently not having any pain or complaint.  Antonio Serrano denies any weakness, fatigue, fevers.  The history is provided by the Antonio Serrano and medical records.  Abnormal Lab      Home Medications Prior to Admission medications   Medication Sig Start Date End Date Taking? Authorizing Provider  acetaminophen (TYLENOL) 500 MG tablet Take 1,000 mg by mouth as needed for moderate pain.    [provider]  ciprofloxacin (CIPRO) 500 MG tablet Take 1 tablet (500 mg total) by mouth 2 (two) times daily for 2 days. 04/23/23 04/25/23  Pokhrel, Rebekah Chesterfield, MD  clopidogrel (PLAVIX) 75 MG tablet Take 1 tablet by mouth once daily 01/25/23   [name], [name], MD  Docusate Calcium (STOOL SOFTENER PO) Take 1 tablet by mouth daily as needed (constipation).    [provider]  ipratropium (ATROVENT) 0.03 % nasal spray USE 2 SPRAY(S) IN EACH NOSTRIL EVERY 12 HOURS Antonio Serrano taking differently: Place 1 spray into both nostrils as needed for rhinitis. 09/13/21   [name], [name], MD  OVER THE COUNTER MEDICATION Take 1 tablet by mouth daily. Elderberry, zinc, and vitamin C combo medication OTC.    [provider]  pantoprazole (PROTONIX) 40 MG tablet Take 1 tablet by mouth once daily 04/18/23   [name], [name],  MD  polyethylene glycol (MIRALAX / GLYCOLAX) 17 g packet Take 17 g by mouth daily as needed for mild constipation. 04/23/23   Pokhrel, Rebekah Chesterfield, MD  sodium chloride 1 g tablet Take 1 tablet (1 g total) by mouth 2 (two) times daily with a meal for 14 days. 04/23/23 05/07/23  Pokhrel, Rebekah Chesterfield, MD  traZODone (DESYREL) 50 MG tablet TAKE 1 TABLET BY MOUTH EVERY DAY AT BEDTIME AS NEEDED FOR SLEEP Antonio Serrano taking differently: Take 50 mg by mouth at bedtime as needed for sleep. 02/11/23   [name], [name], MD      Allergies    Bactrim [sulfamethoxazole-trimethoprim]    Review of Systems   Review of Systems  Constitutional:  Negative for chills and fever.  Respiratory:  Negative for shortness of breath.   Cardiovascular:  Negative for chest pain.  Gastrointestinal:  Negative for abdominal pain, diarrhea, nausea and vomiting.  Genitourinary:  Negative for flank pain.  Musculoskeletal:  Negative for back pain.  Neurological:  Negative for weakness, light-headedness and headaches.  All other systems reviewed and are negative.   Physical Exam Updated Vital Signs BP (!) 173/86   Pulse 67   Temp 97.6 F (36.4 C) (Oral)   Resp 17   Ht 5\' 5"  (1.651 m)   Wt 74.4 kg   SpO2 98%   BMI 27.29 kg/m  Physical Exam Vitals and nursing note reviewed.  Constitutional:  Appearance: Antonio Serrano is well-developed.  HENT:     Head: Normocephalic and atraumatic.  Eyes:     General: No scleral icterus.    Pupils: Pupils are equal, round, and reactive to light.  Cardiovascular:     Rate and Rhythm: Normal rate.     Heart sounds: Normal heart sounds.  Pulmonary:     Effort: Pulmonary effort is normal.     Breath sounds: Normal breath sounds. No wheezing.  Chest:     Chest wall: No tenderness.  Abdominal:     General: Bowel sounds are normal. There is no distension.     Palpations: Abdomen is soft.     Tenderness: There is no abdominal tenderness.  Musculoskeletal:        General: No tenderness or deformity.      Cervical back: Normal range of motion.  Skin:    General: Skin is warm and dry.  Neurological:     Mental Status: Antonio Serrano is alert and oriented to person, place, and time.     ED Results / Procedures / Treatments   Labs (all labs ordered are listed, but only abnormal results are displayed) Labs Reviewed  CBC WITH DIFFERENTIAL/PLATELET - Abnormal; Notable for the following components:      Result Value   Abs Immature Granulocytes 0.21 (*)    All other components within normal limits  COMPREHENSIVE METABOLIC PANEL - Abnormal; Notable for the following components:   Sodium 124 (*)    Chloride 91 (*)    Glucose, Bld 163 (*)    Creatinine, Ser 1.35 (*)    Calcium 8.5 (*)    Albumin 3.1 (*)    GFR, Estimated 55 (*)    All other components within normal limits  URINALYSIS, ROUTINE W REFLEX MICROSCOPIC - Abnormal; Notable for the following components:   Color, Urine STRAW (*)    Glucose, UA >=500 (*)    All other components within normal limits    EKG None  Radiology No results found.  Procedures Procedures    Medications Ordered in ED Medications  lactated ringers bolus 1,000 mL (0 mLs Intravenous Stopped 04/25/23 2300)    ED Course/ Medical Decision Making/ A&P Clinical Course as of 04/25/23 2356  Thu Apr 25, 2023  2054 Calcium(!): 8.5 [JS]  2129 Antonio Serrano was sent in for lab abnormalities including hyponatremia.  Recently admitted for dehydration and similar.  Labs are at the baseline from discharge.  Antonio Serrano is supposed follow-up with his PCP in the outpatient setting for ongoing care and management.  Anticipate Antonio Serrano will continue to have mild hyponatremia.  Given slight increase in his creatinine from 2 days ago we will give him IV fluids but is not far off of his baseline. No acute other pathology detected on evaluation today and Antonio Serrano is requesting discharge back to facility which is reasonable at this time. [CC]  2249 Creatinine(!): 1.35 Slightly elevated from baseline [JS]     Clinical Course User Index [CC] Glyn Ade, MD [JS] Claude Manges, PA-C                             Medical Decision Making Amount and/or Complexity of Data Reviewed Labs: ordered. Decision-making details documented in ED Course.   This Antonio Serrano presents to the ED for concern of abnormal labs, this involves a number of treatment options, and is a complaint that carries with it a high risk of complications and morbidity.  The differential  diagnosis includes electrolyte derangement, infection versus other.    Co morbidities: Discussed in HPI   Brief History:  See HPI.  EMR reviewed including pt PMHx, past surgical history and past visits to ER.   See HPI for more details   Lab Tests:  I ordered and independently interpreted labs.  The pertinent results include:    Labs notable for    Imaging Studies:  N/A  Cardiac Monitoring:  N/A  Medicines ordered:  I ordered medication including bolus  for symptomatic treatment  Reevaluation of the Antonio Serrano after these medicines showed that the Antonio Serrano improved I have reviewed the patients home medicines and have made adjustments as needed  Reevaluation:  After the interventions noted above I re-evaluated Antonio Serrano and found that they have :improved  Social Determinants of Health:  The Antonio Serrano's social determinants of health were a factor in the care of this Antonio Serrano  Problem List / ED Course:  Antonio Serrano here for chronic hyponatremia, last discharge from the hospital 2 days ago, looks like Antonio Serrano was sent home on sodium tablets.  Today Antonio Serrano reports Antonio Serrano has not had any symptoms.  Antonio Serrano denies any pain, shortness of breath, dizziness, weakness.  Labs obtained today showed hyponatremia, however this has been consistently chronically low and is currently on replacement.  There was concern for low calcium, however this is stable today.  Antonio Serrano does have a mild AKI therefore Antonio Serrano was given bolus to help with symptomatic treatment.  CBC with  no leukocytosis, hemoglobin is within normal limits.  UA with no nitrites, no leukocytes, no source of infection.  Antonio Serrano does not have any complaints today.  I do not feel that Antonio Serrano needs admission at this time.  Antonio Serrano does reside currently in a facility for rehabilitation of his right hip and back. Antonio Serrano denies any other complaints.   Dispostion:  After consideration of the diagnostic results and the patients response to treatment, I feel that the patent would benefit from follow-up with primary care physician as needed.   Portions of this note were generated with Scientist, clinical (histocompatibility and immunogenetics). Dictation errors may occur despite best attempts at proofreading.   Final Clinical Impression(s) / ED Diagnoses Final diagnoses:  Hyponatremia  AKI (acute kidney injury) [facility])    Rx / DC Orders ED Discharge Orders     None         Claude Manges, PA-C 04/25/23 2356    Glyn Ade, MD 04/26/23 [id number]

## 2023-04-25 NOTE — ED Notes (Signed)
PTAR notified for pt transport back to [facility]

## 2023-04-25 NOTE — ED Triage Notes (Signed)
Patient presented to ER for low sodium, patient was seen in ER earlier this week and was given something to help correct his sodium (per patient). Labs drawn today and sodium was still low so instructed to come to ED. NA 124 on 7/22 but cannot see labs from today.

## 2023-04-25 NOTE — Discharge Instructions (Signed)
Your sodium today was the same as it was at discharge, continue your sodium tablets as prescribed.   We gave you fluids while in the ED. If you experience any symptoms please return to the ED.

## 2023-04-26 DIAGNOSIS — Z7401 Bed confinement status: Secondary | ICD-10-CM | POA: Diagnosis not present

## 2023-04-26 DIAGNOSIS — R531 Weakness: Secondary | ICD-10-CM | POA: Diagnosis not present

## 2023-05-01 ENCOUNTER — Ambulatory Visit: Payer: Medicare Other | Admitting: Family Medicine

## 2023-05-01 ENCOUNTER — Telehealth (HOSPITAL_BASED_OUTPATIENT_CLINIC_OR_DEPARTMENT_OTHER): Payer: Self-pay

## 2023-05-01 DIAGNOSIS — I2510 Atherosclerotic heart disease of native coronary artery without angina pectoris: Secondary | ICD-10-CM | POA: Diagnosis not present

## 2023-05-01 DIAGNOSIS — N390 Urinary tract infection, site not specified: Secondary | ICD-10-CM | POA: Diagnosis not present

## 2023-05-01 DIAGNOSIS — G629 Polyneuropathy, unspecified: Secondary | ICD-10-CM | POA: Diagnosis not present

## 2023-05-01 DIAGNOSIS — M79651 Pain in right thigh: Secondary | ICD-10-CM | POA: Diagnosis not present

## 2023-05-02 LAB — BASIC METABOLIC PANEL
BUN: 10 (ref 4–21)
CO2: 20 (ref 13–22)
Chloride: 95 — AB (ref 99–108)
Creatinine: 1.1 (ref 0.6–1.3)
Glucose: 68
Potassium: 4.6 meq/L (ref 3.5–5.1)
Sodium: 128 — AB (ref 137–147)

## 2023-05-02 LAB — CBC AND DIFFERENTIAL
HCT: 42 (ref 41–53)
Hemoglobin: 14.6 (ref 13.5–17.5)
Platelets: 313 10*3/uL (ref 150–400)
WBC: 7

## 2023-05-02 LAB — LAB REPORT - SCANNED: EGFR: 72

## 2023-05-02 LAB — COMPREHENSIVE METABOLIC PANEL: Calcium: 8.7 (ref 8.7–10.7)

## 2023-05-02 LAB — CBC: RBC: 4.81 (ref 3.87–5.11)

## 2023-05-10 ENCOUNTER — Telehealth: Payer: Self-pay

## 2023-05-10 DIAGNOSIS — F17210 Nicotine dependence, cigarettes, uncomplicated: Secondary | ICD-10-CM | POA: Diagnosis not present

## 2023-05-10 DIAGNOSIS — I700 Atherosclerosis of aorta: Secondary | ICD-10-CM | POA: Diagnosis not present

## 2023-05-10 DIAGNOSIS — Z8673 Personal history of transient ischemic attack (TIA), and cerebral infarction without residual deficits: Secondary | ICD-10-CM | POA: Diagnosis not present

## 2023-05-10 DIAGNOSIS — D72829 Elevated white blood cell count, unspecified: Secondary | ICD-10-CM | POA: Diagnosis not present

## 2023-05-10 DIAGNOSIS — N183 Chronic kidney disease, stage 3 unspecified: Secondary | ICD-10-CM | POA: Diagnosis not present

## 2023-05-10 DIAGNOSIS — Z96641 Presence of right artificial hip joint: Secondary | ICD-10-CM | POA: Diagnosis not present

## 2023-05-10 DIAGNOSIS — K219 Gastro-esophageal reflux disease without esophagitis: Secondary | ICD-10-CM | POA: Diagnosis not present

## 2023-05-10 DIAGNOSIS — Z7902 Long term (current) use of antithrombotics/antiplatelets: Secondary | ICD-10-CM | POA: Diagnosis not present

## 2023-05-10 DIAGNOSIS — E871 Hypo-osmolality and hyponatremia: Secondary | ICD-10-CM | POA: Diagnosis not present

## 2023-05-10 DIAGNOSIS — E785 Hyperlipidemia, unspecified: Secondary | ICD-10-CM | POA: Diagnosis not present

## 2023-05-10 DIAGNOSIS — I129 Hypertensive chronic kidney disease with stage 1 through stage 4 chronic kidney disease, or unspecified chronic kidney disease: Secondary | ICD-10-CM | POA: Diagnosis not present

## 2023-05-10 DIAGNOSIS — N390 Urinary tract infection, site not specified: Secondary | ICD-10-CM | POA: Diagnosis not present

## 2023-05-10 DIAGNOSIS — E1122 Type 2 diabetes mellitus with diabetic chronic kidney disease: Secondary | ICD-10-CM | POA: Diagnosis not present

## 2023-05-10 DIAGNOSIS — M4316 Spondylolisthesis, lumbar region: Secondary | ICD-10-CM | POA: Diagnosis not present

## 2023-05-10 DIAGNOSIS — I2510 Atherosclerotic heart disease of native coronary artery without angina pectoris: Secondary | ICD-10-CM | POA: Diagnosis not present

## 2023-05-10 DIAGNOSIS — M5136 Other intervertebral disc degeneration, lumbar region: Secondary | ICD-10-CM | POA: Diagnosis not present

## 2023-05-10 DIAGNOSIS — M47816 Spondylosis without myelopathy or radiculopathy, lumbar region: Secondary | ICD-10-CM | POA: Diagnosis not present

## 2023-05-10 DIAGNOSIS — Z604 Social exclusion and rejection: Secondary | ICD-10-CM | POA: Diagnosis not present

## 2023-05-10 DIAGNOSIS — E1140 Type 2 diabetes mellitus with diabetic neuropathy, unspecified: Secondary | ICD-10-CM | POA: Diagnosis not present

## 2023-05-10 DIAGNOSIS — K5900 Constipation, unspecified: Secondary | ICD-10-CM | POA: Diagnosis not present

## 2023-05-10 DIAGNOSIS — M79651 Pain in right thigh: Secondary | ICD-10-CM | POA: Diagnosis not present

## 2023-05-10 DIAGNOSIS — M48061 Spinal stenosis, lumbar region without neurogenic claudication: Secondary | ICD-10-CM | POA: Diagnosis not present

## 2023-05-10 NOTE — Transitions of Care (Post Inpatient/ED Visit) (Signed)
   05/10/2023  Name: Willmer Hilton MRN: [id number] DOB: October 27, 1945  Today's TOC FU Call Status: Today's TOC FU Call Status:: Successful TOC FU Call Completed TOC FU Call Complete Date: 05/10/23  Transition Care Management Follow-up Telephone Call Date of Discharge: 05/09/23 Discharge Facility: Other ([facility]) Name of Other ([facility]) [facility]charge Facility: [facility] Type of Discharge: Inpatient Admission Primary Inpatient Discharge Diagnosis:: Other intervertebral disc degeneration, lumbar region How have you been since you were released from the hospital?: Better Any questions or concerns?: No  Items Reviewed: Did you receive and understand the discharge instructions provided?: Yes Medications obtained,verified, and reconciled?: Yes (Medications Reviewed) Any new allergies since your discharge?: No Dietary orders reviewed?: Yes Do you have support at home?: Yes People in Home: spouse  Medications Reviewed Today: Medications Reviewed Today   Medications were not reviewed in this encounter     Home Care and Equipment/Supplies: Were Home Health Services Ordered?: No Any new equipment or medical supplies ordered?: No  Functional Questionnaire: Do you need assistance with bathing/showering or dressing?: No Do you need assistance with meal preparation?: No Do you need assistance with eating?: No Do you have difficulty maintaining continence: No Do you need assistance with getting out of bed/getting out of a chair/moving?: Yes Do you have difficulty managing or taking your medications?: No  Follow up appointments reviewed: PCP Follow-up appointment confirmed?: Yes Date of PCP follow-up appointment?: 05/14/23 Specialist Hospital Follow-up appointment confirmed?: NA Do you need transportation to your follow-up appointment?: No Do you understand care options if your condition(s) worsen?: Yes-patient verbalized understanding    SIGNATURE tb,cma

## 2023-05-10 NOTE — Telephone Encounter (Signed)
Arlys John with [employer] called for verbal orders for pt. Frequency would be 2 times a week for 4 weeks then 1 time a week for 4 weeks for strength and balance training. Arlys John can be reached at [phone]. It is ok to leave a VM. Please advise

## 2023-05-10 NOTE — Telephone Encounter (Signed)
Yes okay 

## 2023-05-13 DIAGNOSIS — E1122 Type 2 diabetes mellitus with diabetic chronic kidney disease: Secondary | ICD-10-CM | POA: Diagnosis not present

## 2023-05-13 DIAGNOSIS — K5900 Constipation, unspecified: Secondary | ICD-10-CM | POA: Diagnosis not present

## 2023-05-13 DIAGNOSIS — D72829 Elevated white blood cell count, unspecified: Secondary | ICD-10-CM | POA: Diagnosis not present

## 2023-05-13 DIAGNOSIS — M79651 Pain in right thigh: Secondary | ICD-10-CM | POA: Diagnosis not present

## 2023-05-13 DIAGNOSIS — N183 Chronic kidney disease, stage 3 unspecified: Secondary | ICD-10-CM | POA: Diagnosis not present

## 2023-05-13 DIAGNOSIS — E1140 Type 2 diabetes mellitus with diabetic neuropathy, unspecified: Secondary | ICD-10-CM | POA: Diagnosis not present

## 2023-05-13 DIAGNOSIS — I129 Hypertensive chronic kidney disease with stage 1 through stage 4 chronic kidney disease, or unspecified chronic kidney disease: Secondary | ICD-10-CM | POA: Diagnosis not present

## 2023-05-13 DIAGNOSIS — I2510 Atherosclerotic heart disease of native coronary artery without angina pectoris: Secondary | ICD-10-CM | POA: Diagnosis not present

## 2023-05-13 DIAGNOSIS — M47816 Spondylosis without myelopathy or radiculopathy, lumbar region: Secondary | ICD-10-CM | POA: Diagnosis not present

## 2023-05-13 DIAGNOSIS — M48061 Spinal stenosis, lumbar region without neurogenic claudication: Secondary | ICD-10-CM | POA: Diagnosis not present

## 2023-05-13 DIAGNOSIS — F17210 Nicotine dependence, cigarettes, uncomplicated: Secondary | ICD-10-CM | POA: Diagnosis not present

## 2023-05-13 DIAGNOSIS — M5136 Other intervertebral disc degeneration, lumbar region: Secondary | ICD-10-CM | POA: Diagnosis not present

## 2023-05-13 DIAGNOSIS — K219 Gastro-esophageal reflux disease without esophagitis: Secondary | ICD-10-CM | POA: Diagnosis not present

## 2023-05-13 DIAGNOSIS — N390 Urinary tract infection, site not specified: Secondary | ICD-10-CM | POA: Diagnosis not present

## 2023-05-13 DIAGNOSIS — E871 Hypo-osmolality and hyponatremia: Secondary | ICD-10-CM | POA: Diagnosis not present

## 2023-05-13 DIAGNOSIS — E785 Hyperlipidemia, unspecified: Secondary | ICD-10-CM | POA: Diagnosis not present

## 2023-05-13 NOTE — Telephone Encounter (Signed)
Tried [employer], unable to LVM.

## 2023-05-14 ENCOUNTER — Ambulatory Visit (INDEPENDENT_AMBULATORY_CARE_PROVIDER_SITE_OTHER): Payer: Medicare Other | Admitting: Family Medicine

## 2023-05-14 ENCOUNTER — Encounter: Payer: Self-pay | Admitting: Family Medicine

## 2023-05-14 VITALS — BP 132/78 | HR 96 | Wt 147.4 lb

## 2023-05-14 DIAGNOSIS — E871 Hypo-osmolality and hyponatremia: Secondary | ICD-10-CM

## 2023-05-14 DIAGNOSIS — M79651 Pain in right thigh: Secondary | ICD-10-CM | POA: Diagnosis not present

## 2023-05-14 LAB — BASIC METABOLIC PANEL
BUN: 8 mg/dL (ref 6–23)
CO2: 22 mEq/L (ref 19–32)
Calcium: 9.2 mg/dL (ref 8.4–10.5)
Chloride: 96 mEq/L (ref 96–112)
Creatinine, Ser: 1.21 mg/dL (ref 0.40–1.50)
GFR: 58.54 mL/min — ABNORMAL LOW (ref >60.00)
Glucose, Bld: 173 mg/dL — ABNORMAL HIGH (ref 70–99)
Potassium: 4.3 mEq/L (ref 3.5–5.1)
Sodium: 127 mEq/L — ABNORMAL LOW (ref 135–145)

## 2023-05-14 MED ORDER — SODIUM CHLORIDE 1 G PO TABS: 1.0000 g | ORAL_TABLET | Freq: Two times a day (BID) | ORAL | 1 refills | Status: DC

## 2023-05-14 MED ORDER — TRAZODONE HCL 50 MG PO TABS: ORAL_TABLET | ORAL | 1 refills | Status: DC

## 2023-05-14 MED ORDER — PANTOPRAZOLE SODIUM 40 MG PO TBEC: 40.0000 mg | DELAYED_RELEASE_TABLET | Freq: Every day | ORAL | 1 refills | Status: DC

## 2023-05-14 MED ORDER — CLOPIDOGREL BISULFATE 75 MG PO TABS: 75.0000 mg | ORAL_TABLET | Freq: Every day | ORAL | 1 refills | Status: DC

## 2023-05-14 NOTE — Patient Instructions (Signed)
Stop gabapentin.

## 2023-05-14 NOTE — Progress Notes (Signed)
OFFICE VISIT  05/14/2023  CC:  Chief Complaint  Patient presents with   Hospitalization Follow-up    Pt was admitted for additional testing regarding his knee/leg. Testing did not find anything. Pt states leg is still causing pain.     Patient is a 77 y.o. male who presents for follow-up recent hospitalization and return to the ED visit. He was hospitalized from 7/19 to 04/23/2023 and he returned to the emergency department on 04/25/2023. His hospital admission was for progressive right thigh pain.  Imaging of the thigh with x-ray and MRI did not show any acute findings. Imaging of the lumbar spine showed diffuse degenerative degenerative disc disease and facet arthropathy.  Radiographs of the right hip showed no acute abnormality. He was treated for Enterobacter cloacae UTI--> Cipro x 5 days.  He has asymptomatic chronic hypotonic hyponatremia.  Discharge medications were Cipro, Plavix, pantoprazole, MiraLAX, stool softener, and trazodone He was discharged to a skilled nursing facility.  Repeat labs there showed low calcium and low sodium and he was instructed by the facility to go back to the emergency department.  He was without any new symptoms at that time.  In the ED his sodium was 124, serum creatinine 1.35 (baseline), calcium 8.5, albumin 3.1. No treatment was required.  INTERIM HX: He stayed in rehab for 3 weeks and was discharged to home 5 days ago. He is doing pretty good, only some pain in his right hip and thigh when he walks.  He can ambulate anywhere from 10 steps on a bad day to 30 steps on a good day using a walker. He is otherwise doing a self-propelled wheelchair. He takes gabapentin 100 mg a day and says this makes him feel no different regarding his pain. He would rather just get off this medication.  He sleeps well with trazodone 50 mg every night.   Past Medical History:  Diagnosis Date   Atherosclerosis of coronary artery    On chest CT->calcif in LAD and RCA    Chronic low back pain    MR 11/2021 showed L4-5 foraminal stenosis--> plan per Ortho was to do L4-5 injection   Chronic prostatitis    Very mild elevation of PSA (>4), referred to [facility] where PSA repeat was 1.0 on 06/16/13.  Annual PSA repeat is all that is needed now per urologist.  Most recent 07/2015 was 0.45.   Chronic renal insufficiency, stage III (moderate) (HCC)    Baseline GFR as of 2019= 50s.     Closed right hip fracture (HCC) 03/2021   THA   Diabetes mellitus with complication (HCC) Dx'd fall 2011   HTN (hypertension)    Renal/aortic doppler u/s normal 06/2010   Hypercalcemia    Hyperlipidemia 2016   Statin intolerant--myalgias.  Pt refuses any further trial of statin as of 2018.   Nonspecific abnormal electrocardiogram (ECG) (EKG) Fall 2011   TWI in inferior leads: myocardial perfusion scan neg and echo normal 07/2010   Pseudomonas aeruginosa colonization    12/2021 urine clx   TIA (transient ischemic attack)    12/2021- CT angio head/neck no high grade stenosis.  MR showed old lacunar infarcts corona radiata, basal ganglia, thalamus.  Echo normal.   Tobacco dependence    UTI (lower urinary tract infection)    Feb 2012 (klebsiella--dx at [facility]); 03/2013 e coli.     Past Surgical History:  Procedure Laterality Date   CARDIOVASCULAR STRESS TEST  07/01/2010   Myocardial perfusion scan neg/low risk.   CATARACT  EXTRACTION  2007, 2008   Bilateral ([facility] on [address].   COLONOSCOPY  12/20/04;12/2014   2016 no polyps: recall 5 yrs due to FH of colon ca   ESOPHAGOGASTRODUODENOSCOPY  11/29/2004   esoph stricture/dilation   TOTAL HIP ARTHROPLASTY Right 04/23/2021   Procedure: TOTAL HIP ARTHROPLASTY ANTERIOR APPROACH;  Surgeon: Cammy Copa, MD;  Location: [facility];  Service: Orthopedics;  Laterality: Right;  Hana, C-arm, Depuy   TRANSTHORACIC ECHOCARDIOGRAM  07/01/2010   2011 EF =>55%; LA mildly dilated; trace MR/TR.  12/2021 EF 60-65%, DD indeterm,  valves nl.    Outpatient Medications Prior to Visit  Medication Sig Dispense Refill   Docusate Calcium (STOOL SOFTENER PO) Take 1 tablet by mouth daily as needed (constipation).     ipratropium (ATROVENT) 0.03 % nasal spray USE 2 SPRAY(S) IN EACH NOSTRIL EVERY 12 HOURS (Patient taking differently: Place 1 spray into both nostrils as needed for rhinitis.) 30 mL 11   OVER THE COUNTER MEDICATION Take 1 tablet by mouth daily. Elderberry, zinc, and vitamin C combo medication OTC.     polyethylene glycol (MIRALAX / GLYCOLAX) 17 g packet Take 17 g by mouth daily as needed for mild constipation.     clopidogrel (PLAVIX) 75 MG tablet Take 1 tablet by mouth once daily 90 tablet 0   gabapentin (NEURONTIN) 100 MG capsule Take 100 mg by mouth daily.     pantoprazole (PROTONIX) 40 MG tablet Take 1 tablet by mouth once daily 90 tablet 1   traZODone (DESYREL) 50 MG tablet TAKE 1 TABLET BY MOUTH EVERY DAY AT BEDTIME AS NEEDED FOR SLEEP (Patient taking differently: Take 50 mg by mouth at bedtime as needed for sleep.) 90 tablet 0   acetaminophen (TYLENOL) 500 MG tablet Take 1,000 mg by mouth as needed for moderate pain. (Patient not taking: Reported on 05/14/2023)     No facility-administered medications prior to visit.    Allergies  Allergen Reactions   Bactrim [Sulfamethoxazole-Trimethoprim] Nausea Only    Review of Systems As per HPI  PE:    05/14/2023    9:09 AM 04/25/2023   10:30 PM 04/25/2023    9:45 PM  Vitals with BMI  Weight 147 lbs 6 oz    BMI 24.53    Systolic 132 173 829  Diastolic 78 86 80  Pulse 96 67 65     Physical Exam  Gen: Alert, well appearing.  Patient is oriented to person, place, time, and situation. AFFECT: pleasant, lucid thought and speech. Extremities: No edema.  LABS:  Last CBC Lab Results  Component Value Date   WBC 6.3 04/25/2023   HGB 15.3 04/25/2023   HCT 45.1 04/25/2023   MCV 86.4 04/25/2023   MCH 29.3 04/25/2023   RDW 13.8 04/25/2023   PLT 317  04/25/2023   Last metabolic panel Lab Results  Component Value Date   GLUCOSE 163 (H) 04/25/2023   NA 124 (L) 04/25/2023   K 4.3 04/25/2023   CL 91 (L) 04/25/2023   CO2 22 04/25/2023   BUN 15 04/25/2023   CREATININE 1.35 (H) 04/25/2023   GFRNONAA 55 (L) 04/25/2023   CALCIUM 8.5 (L) 04/25/2023   PHOS 3.7 04/24/2021   PROT 6.5 04/25/2023   ALBUMIN 3.1 (L) 04/25/2023   LABGLOB 2.3 02/16/2019   AGRATIO 1.9 02/16/2019   BILITOT 0.5 04/25/2023   ALKPHOS 101 04/25/2023   AST 25 04/25/2023   ALT 35 04/25/2023   ANIONGAP 11 04/25/2023   Last lipids Lab Results  Component Value Date   CHOL 135 01/05/2022   HDL 46 01/05/2022   LDLCALC 70 01/05/2022   TRIG 95 01/05/2022   CHOLHDL 2.9 01/05/2022   Last hemoglobin A1c Lab Results  Component Value Date   HGBA1C 6.2 (A) 02/11/2023   HGBA1C 6.2 02/11/2023   HGBA1C 6.2 02/11/2023   HGBA1C 6.2 02/11/2023   Last thyroid functions Lab Results  Component Value Date   TSH 1.120 04/20/2023   IMPRESSION AND PLAN:  #1 chronic right hip and thigh pain. Low back and hip and thigh imaging in the hospital were without acute abnormality. Suspect this is due to alteration in mechanics ever since his hip fracture and subsequent hip replacement back in 2022. He progressed with rehab well at skilled nursing. Home health PT and OT is in the process of getting set up.  #2 hypotonic hyponatremia. Chronic. Asymptomatic. He is on sodium chloride 1 g tab twice a day now.  We talked about limiting free water intake. Monitor electrolytes and creatinine today.  3.  Diabetes. Diet controlled at this point. Last A1c 3 months ago was 6.2%. Will do A1c in 6 weeks.  An After Visit Summary was printed and given to the patient.  FOLLOW UP: Return in about 6 weeks (around 06/25/2023) for routine chronic illness f/u.  Signed:  Santiago Bumpers, MD           05/14/2023

## 2023-05-16 DIAGNOSIS — E1122 Type 2 diabetes mellitus with diabetic chronic kidney disease: Secondary | ICD-10-CM | POA: Diagnosis not present

## 2023-05-16 DIAGNOSIS — K5900 Constipation, unspecified: Secondary | ICD-10-CM | POA: Diagnosis not present

## 2023-05-16 DIAGNOSIS — E871 Hypo-osmolality and hyponatremia: Secondary | ICD-10-CM | POA: Diagnosis not present

## 2023-05-16 DIAGNOSIS — K219 Gastro-esophageal reflux disease without esophagitis: Secondary | ICD-10-CM | POA: Diagnosis not present

## 2023-05-16 DIAGNOSIS — F17210 Nicotine dependence, cigarettes, uncomplicated: Secondary | ICD-10-CM | POA: Diagnosis not present

## 2023-05-16 DIAGNOSIS — N183 Chronic kidney disease, stage 3 unspecified: Secondary | ICD-10-CM | POA: Diagnosis not present

## 2023-05-16 DIAGNOSIS — I2510 Atherosclerotic heart disease of native coronary artery without angina pectoris: Secondary | ICD-10-CM | POA: Diagnosis not present

## 2023-05-16 DIAGNOSIS — M5136 Other intervertebral disc degeneration, lumbar region: Secondary | ICD-10-CM | POA: Diagnosis not present

## 2023-05-16 DIAGNOSIS — N390 Urinary tract infection, site not specified: Secondary | ICD-10-CM | POA: Diagnosis not present

## 2023-05-16 DIAGNOSIS — I129 Hypertensive chronic kidney disease with stage 1 through stage 4 chronic kidney disease, or unspecified chronic kidney disease: Secondary | ICD-10-CM | POA: Diagnosis not present

## 2023-05-16 DIAGNOSIS — E1140 Type 2 diabetes mellitus with diabetic neuropathy, unspecified: Secondary | ICD-10-CM | POA: Diagnosis not present

## 2023-05-16 DIAGNOSIS — M79651 Pain in right thigh: Secondary | ICD-10-CM | POA: Diagnosis not present

## 2023-05-16 DIAGNOSIS — M48061 Spinal stenosis, lumbar region without neurogenic claudication: Secondary | ICD-10-CM | POA: Diagnosis not present

## 2023-05-16 DIAGNOSIS — E785 Hyperlipidemia, unspecified: Secondary | ICD-10-CM | POA: Diagnosis not present

## 2023-05-16 DIAGNOSIS — M47816 Spondylosis without myelopathy or radiculopathy, lumbar region: Secondary | ICD-10-CM | POA: Diagnosis not present

## 2023-05-16 DIAGNOSIS — D72829 Elevated white blood cell count, unspecified: Secondary | ICD-10-CM | POA: Diagnosis not present

## 2023-05-23 ENCOUNTER — Ambulatory Visit: Payer: Medicare Other | Admitting: Neurology

## 2023-05-23 DIAGNOSIS — M47816 Spondylosis without myelopathy or radiculopathy, lumbar region: Secondary | ICD-10-CM | POA: Diagnosis not present

## 2023-05-23 DIAGNOSIS — K219 Gastro-esophageal reflux disease without esophagitis: Secondary | ICD-10-CM | POA: Diagnosis not present

## 2023-05-23 DIAGNOSIS — E1140 Type 2 diabetes mellitus with diabetic neuropathy, unspecified: Secondary | ICD-10-CM | POA: Diagnosis not present

## 2023-05-23 DIAGNOSIS — N183 Chronic kidney disease, stage 3 unspecified: Secondary | ICD-10-CM | POA: Diagnosis not present

## 2023-05-23 DIAGNOSIS — F17210 Nicotine dependence, cigarettes, uncomplicated: Secondary | ICD-10-CM | POA: Diagnosis not present

## 2023-05-23 DIAGNOSIS — N390 Urinary tract infection, site not specified: Secondary | ICD-10-CM | POA: Diagnosis not present

## 2023-05-23 DIAGNOSIS — E871 Hypo-osmolality and hyponatremia: Secondary | ICD-10-CM | POA: Diagnosis not present

## 2023-05-23 DIAGNOSIS — E1122 Type 2 diabetes mellitus with diabetic chronic kidney disease: Secondary | ICD-10-CM | POA: Diagnosis not present

## 2023-05-23 DIAGNOSIS — E785 Hyperlipidemia, unspecified: Secondary | ICD-10-CM | POA: Diagnosis not present

## 2023-05-23 DIAGNOSIS — M5136 Other intervertebral disc degeneration, lumbar region: Secondary | ICD-10-CM | POA: Diagnosis not present

## 2023-05-23 DIAGNOSIS — M48061 Spinal stenosis, lumbar region without neurogenic claudication: Secondary | ICD-10-CM | POA: Diagnosis not present

## 2023-05-23 DIAGNOSIS — I2510 Atherosclerotic heart disease of native coronary artery without angina pectoris: Secondary | ICD-10-CM | POA: Diagnosis not present

## 2023-05-23 DIAGNOSIS — I129 Hypertensive chronic kidney disease with stage 1 through stage 4 chronic kidney disease, or unspecified chronic kidney disease: Secondary | ICD-10-CM | POA: Diagnosis not present

## 2023-05-23 DIAGNOSIS — M79651 Pain in right thigh: Secondary | ICD-10-CM | POA: Diagnosis not present

## 2023-05-23 DIAGNOSIS — K5900 Constipation, unspecified: Secondary | ICD-10-CM | POA: Diagnosis not present

## 2023-05-23 DIAGNOSIS — D72829 Elevated white blood cell count, unspecified: Secondary | ICD-10-CM | POA: Diagnosis not present

## 2023-05-24 ENCOUNTER — Telehealth: Payer: Self-pay

## 2023-05-24 DIAGNOSIS — Z7902 Long term (current) use of antithrombotics/antiplatelets: Secondary | ICD-10-CM

## 2023-05-24 DIAGNOSIS — E785 Hyperlipidemia, unspecified: Secondary | ICD-10-CM | POA: Diagnosis not present

## 2023-05-24 DIAGNOSIS — M79651 Pain in right thigh: Secondary | ICD-10-CM | POA: Diagnosis not present

## 2023-05-24 DIAGNOSIS — K219 Gastro-esophageal reflux disease without esophagitis: Secondary | ICD-10-CM | POA: Diagnosis not present

## 2023-05-24 DIAGNOSIS — N183 Chronic kidney disease, stage 3 unspecified: Secondary | ICD-10-CM | POA: Diagnosis not present

## 2023-05-24 DIAGNOSIS — Z8673 Personal history of transient ischemic attack (TIA), and cerebral infarction without residual deficits: Secondary | ICD-10-CM

## 2023-05-24 DIAGNOSIS — I2510 Atherosclerotic heart disease of native coronary artery without angina pectoris: Secondary | ICD-10-CM | POA: Diagnosis not present

## 2023-05-24 DIAGNOSIS — D72829 Elevated white blood cell count, unspecified: Secondary | ICD-10-CM | POA: Diagnosis not present

## 2023-05-24 DIAGNOSIS — M48061 Spinal stenosis, lumbar region without neurogenic claudication: Secondary | ICD-10-CM | POA: Diagnosis not present

## 2023-05-24 DIAGNOSIS — F17210 Nicotine dependence, cigarettes, uncomplicated: Secondary | ICD-10-CM | POA: Diagnosis not present

## 2023-05-24 DIAGNOSIS — N390 Urinary tract infection, site not specified: Secondary | ICD-10-CM | POA: Diagnosis not present

## 2023-05-24 DIAGNOSIS — M4316 Spondylolisthesis, lumbar region: Secondary | ICD-10-CM

## 2023-05-24 DIAGNOSIS — I700 Atherosclerosis of aorta: Secondary | ICD-10-CM

## 2023-05-24 DIAGNOSIS — E1140 Type 2 diabetes mellitus with diabetic neuropathy, unspecified: Secondary | ICD-10-CM | POA: Diagnosis not present

## 2023-05-24 DIAGNOSIS — M5136 Other intervertebral disc degeneration, lumbar region: Secondary | ICD-10-CM | POA: Diagnosis not present

## 2023-05-24 DIAGNOSIS — Z604 Social exclusion and rejection: Secondary | ICD-10-CM

## 2023-05-24 DIAGNOSIS — E871 Hypo-osmolality and hyponatremia: Secondary | ICD-10-CM | POA: Diagnosis not present

## 2023-05-24 DIAGNOSIS — I129 Hypertensive chronic kidney disease with stage 1 through stage 4 chronic kidney disease, or unspecified chronic kidney disease: Secondary | ICD-10-CM | POA: Diagnosis not present

## 2023-05-24 DIAGNOSIS — E1122 Type 2 diabetes mellitus with diabetic chronic kidney disease: Secondary | ICD-10-CM | POA: Diagnosis not present

## 2023-05-24 DIAGNOSIS — K5900 Constipation, unspecified: Secondary | ICD-10-CM | POA: Diagnosis not present

## 2023-05-24 DIAGNOSIS — M47816 Spondylosis without myelopathy or radiculopathy, lumbar region: Secondary | ICD-10-CM | POA: Diagnosis not present

## 2023-05-24 DIAGNOSIS — Z96641 Presence of right artificial hip joint: Secondary | ICD-10-CM

## 2023-05-24 NOTE — Telephone Encounter (Signed)
Signed and put in box to go up front. Signed:  Santiago Bumpers, MD           05/24/2023

## 2023-05-24 NOTE — Telephone Encounter (Signed)
Patient daughter came by office said that home health nurse would not see patient today because of his blood pressure elevated. She thinks it was 190/78.  Daughter states that patient is not taking any blood pressure meds.  His last visit with Dr. Milinda Cave (hospital follow up) on patient med list, there is not any meds for blood pressure. She doesn't know what to do.  Please advise [phone]

## 2023-05-24 NOTE — Telephone Encounter (Signed)
Home health orders received 05/22/23 for [facility] health initiation orders: Yes.  Home health re-certification orders: No. Patient last seen by ordering physician for this condition: 05/14/23. Must be less than 90 days for re-certification and less than 30 days prior for initiation. Visit must have been for the condition the orders are being placed.  Patient meets criteria for Physician to sign orders: Yes.        Current med list has been attached: No        Orders placed on physicians desk for signature: 05/24/23 (date) If patient does not meet criteria for orders to be signed: pt was called to schedule appt. Appt is scheduled for 06/25/23.   Antonio Serrano

## 2023-05-27 DIAGNOSIS — E785 Hyperlipidemia, unspecified: Secondary | ICD-10-CM | POA: Diagnosis not present

## 2023-05-27 DIAGNOSIS — I129 Hypertensive chronic kidney disease with stage 1 through stage 4 chronic kidney disease, or unspecified chronic kidney disease: Secondary | ICD-10-CM | POA: Diagnosis not present

## 2023-05-27 DIAGNOSIS — D72829 Elevated white blood cell count, unspecified: Secondary | ICD-10-CM | POA: Diagnosis not present

## 2023-05-27 DIAGNOSIS — K219 Gastro-esophageal reflux disease without esophagitis: Secondary | ICD-10-CM | POA: Diagnosis not present

## 2023-05-27 DIAGNOSIS — N183 Chronic kidney disease, stage 3 unspecified: Secondary | ICD-10-CM | POA: Diagnosis not present

## 2023-05-27 DIAGNOSIS — F17210 Nicotine dependence, cigarettes, uncomplicated: Secondary | ICD-10-CM | POA: Diagnosis not present

## 2023-05-27 DIAGNOSIS — E1122 Type 2 diabetes mellitus with diabetic chronic kidney disease: Secondary | ICD-10-CM | POA: Diagnosis not present

## 2023-05-27 DIAGNOSIS — E871 Hypo-osmolality and hyponatremia: Secondary | ICD-10-CM | POA: Diagnosis not present

## 2023-05-27 DIAGNOSIS — M79651 Pain in right thigh: Secondary | ICD-10-CM | POA: Diagnosis not present

## 2023-05-27 DIAGNOSIS — K5900 Constipation, unspecified: Secondary | ICD-10-CM | POA: Diagnosis not present

## 2023-05-27 DIAGNOSIS — E1140 Type 2 diabetes mellitus with diabetic neuropathy, unspecified: Secondary | ICD-10-CM | POA: Diagnosis not present

## 2023-05-27 DIAGNOSIS — N390 Urinary tract infection, site not specified: Secondary | ICD-10-CM | POA: Diagnosis not present

## 2023-05-27 DIAGNOSIS — M47816 Spondylosis without myelopathy or radiculopathy, lumbar region: Secondary | ICD-10-CM | POA: Diagnosis not present

## 2023-05-27 DIAGNOSIS — I2510 Atherosclerotic heart disease of native coronary artery without angina pectoris: Secondary | ICD-10-CM | POA: Diagnosis not present

## 2023-05-27 DIAGNOSIS — M5136 Other intervertebral disc degeneration, lumbar region: Secondary | ICD-10-CM | POA: Diagnosis not present

## 2023-05-27 DIAGNOSIS — M48061 Spinal stenosis, lumbar region without neurogenic claudication: Secondary | ICD-10-CM | POA: Diagnosis not present

## 2023-05-28 ENCOUNTER — Telehealth: Payer: Self-pay

## 2023-05-28 NOTE — Telephone Encounter (Signed)
Patient was told by Dr. Milinda Cave to call with blood pressure readings.  8/24 9am   130/92 2pm  140/89 9pm 170/91  8/25 9am 140/90 3pm 140/91 6pm 142/91  8/26 9am 158/90 3pm 160/92 9pm 170/89  8/27 9am 155/90

## 2023-05-28 NOTE — Telephone Encounter (Signed)
Noted  

## 2023-05-29 ENCOUNTER — Telehealth: Payer: Self-pay | Admitting: Family Medicine

## 2023-05-29 NOTE — Telephone Encounter (Signed)
Noted  

## 2023-05-29 NOTE — Telephone Encounter (Signed)
Received message of more blood pressures from home: Over the last 3 days measurements have been in the 130 -170 systolic range over the 89-92 diastolic range. I recommend patient restart amlodipine 2.5 mg daily (he should have this medication at home.  If he does not please send in prescription for amlodipine 2.5 mg tabs, 1 tab p.o. daily, #30, no refill.) Arrange follow-up in 10 to 14 days if not already scheduled with me.

## 2023-05-29 NOTE — Telephone Encounter (Signed)
A user error has taken place: encounter opened in error, closed for administrative reasons.

## 2023-05-30 DIAGNOSIS — E1140 Type 2 diabetes mellitus with diabetic neuropathy, unspecified: Secondary | ICD-10-CM | POA: Diagnosis not present

## 2023-05-30 DIAGNOSIS — D72829 Elevated white blood cell count, unspecified: Secondary | ICD-10-CM | POA: Diagnosis not present

## 2023-05-30 DIAGNOSIS — K5900 Constipation, unspecified: Secondary | ICD-10-CM | POA: Diagnosis not present

## 2023-05-30 DIAGNOSIS — F17210 Nicotine dependence, cigarettes, uncomplicated: Secondary | ICD-10-CM | POA: Diagnosis not present

## 2023-05-30 DIAGNOSIS — M5136 Other intervertebral disc degeneration, lumbar region: Secondary | ICD-10-CM | POA: Diagnosis not present

## 2023-05-30 DIAGNOSIS — M79651 Pain in right thigh: Secondary | ICD-10-CM | POA: Diagnosis not present

## 2023-05-30 DIAGNOSIS — E785 Hyperlipidemia, unspecified: Secondary | ICD-10-CM | POA: Diagnosis not present

## 2023-05-30 DIAGNOSIS — I129 Hypertensive chronic kidney disease with stage 1 through stage 4 chronic kidney disease, or unspecified chronic kidney disease: Secondary | ICD-10-CM | POA: Diagnosis not present

## 2023-05-30 DIAGNOSIS — E1122 Type 2 diabetes mellitus with diabetic chronic kidney disease: Secondary | ICD-10-CM | POA: Diagnosis not present

## 2023-05-30 DIAGNOSIS — M47816 Spondylosis without myelopathy or radiculopathy, lumbar region: Secondary | ICD-10-CM | POA: Diagnosis not present

## 2023-05-30 DIAGNOSIS — E871 Hypo-osmolality and hyponatremia: Secondary | ICD-10-CM | POA: Diagnosis not present

## 2023-05-30 DIAGNOSIS — M48061 Spinal stenosis, lumbar region without neurogenic claudication: Secondary | ICD-10-CM | POA: Diagnosis not present

## 2023-05-30 DIAGNOSIS — K219 Gastro-esophageal reflux disease without esophagitis: Secondary | ICD-10-CM | POA: Diagnosis not present

## 2023-05-30 DIAGNOSIS — N390 Urinary tract infection, site not specified: Secondary | ICD-10-CM | POA: Diagnosis not present

## 2023-05-30 DIAGNOSIS — I2510 Atherosclerotic heart disease of native coronary artery without angina pectoris: Secondary | ICD-10-CM | POA: Diagnosis not present

## 2023-05-30 DIAGNOSIS — N183 Chronic kidney disease, stage 3 unspecified: Secondary | ICD-10-CM | POA: Diagnosis not present

## 2023-05-30 MED ORDER — AMLODIPINE BESYLATE 2.5 MG PO TABS: 2.5000 mg | ORAL_TABLET | Freq: Every day | ORAL | 1 refills | Status: DC

## 2023-05-30 NOTE — Telephone Encounter (Signed)
Pt advised results. New rx sent to pharmacy. Appt scheduled 9/9 at 11, slot currently held to be scheduled

## 2023-06-05 DIAGNOSIS — E785 Hyperlipidemia, unspecified: Secondary | ICD-10-CM | POA: Diagnosis not present

## 2023-06-05 DIAGNOSIS — K219 Gastro-esophageal reflux disease without esophagitis: Secondary | ICD-10-CM | POA: Diagnosis not present

## 2023-06-05 DIAGNOSIS — M5136 Other intervertebral disc degeneration, lumbar region: Secondary | ICD-10-CM | POA: Diagnosis not present

## 2023-06-05 DIAGNOSIS — K5900 Constipation, unspecified: Secondary | ICD-10-CM | POA: Diagnosis not present

## 2023-06-05 DIAGNOSIS — D72829 Elevated white blood cell count, unspecified: Secondary | ICD-10-CM | POA: Diagnosis not present

## 2023-06-05 DIAGNOSIS — E1140 Type 2 diabetes mellitus with diabetic neuropathy, unspecified: Secondary | ICD-10-CM | POA: Diagnosis not present

## 2023-06-05 DIAGNOSIS — E1122 Type 2 diabetes mellitus with diabetic chronic kidney disease: Secondary | ICD-10-CM | POA: Diagnosis not present

## 2023-06-05 DIAGNOSIS — I2510 Atherosclerotic heart disease of native coronary artery without angina pectoris: Secondary | ICD-10-CM | POA: Diagnosis not present

## 2023-06-05 DIAGNOSIS — E871 Hypo-osmolality and hyponatremia: Secondary | ICD-10-CM | POA: Diagnosis not present

## 2023-06-05 DIAGNOSIS — I129 Hypertensive chronic kidney disease with stage 1 through stage 4 chronic kidney disease, or unspecified chronic kidney disease: Secondary | ICD-10-CM | POA: Diagnosis not present

## 2023-06-05 DIAGNOSIS — N390 Urinary tract infection, site not specified: Secondary | ICD-10-CM | POA: Diagnosis not present

## 2023-06-05 DIAGNOSIS — M79651 Pain in right thigh: Secondary | ICD-10-CM | POA: Diagnosis not present

## 2023-06-05 DIAGNOSIS — N183 Chronic kidney disease, stage 3 unspecified: Secondary | ICD-10-CM | POA: Diagnosis not present

## 2023-06-05 DIAGNOSIS — F17210 Nicotine dependence, cigarettes, uncomplicated: Secondary | ICD-10-CM | POA: Diagnosis not present

## 2023-06-05 DIAGNOSIS — M47816 Spondylosis without myelopathy or radiculopathy, lumbar region: Secondary | ICD-10-CM | POA: Diagnosis not present

## 2023-06-05 DIAGNOSIS — M48061 Spinal stenosis, lumbar region without neurogenic claudication: Secondary | ICD-10-CM | POA: Diagnosis not present

## 2023-06-10 ENCOUNTER — Ambulatory Visit (INDEPENDENT_AMBULATORY_CARE_PROVIDER_SITE_OTHER): Payer: Medicare Other | Admitting: Family Medicine

## 2023-06-10 ENCOUNTER — Encounter: Payer: Self-pay | Admitting: Family Medicine

## 2023-06-10 VITALS — BP 145/80 | HR 93 | Temp 98.7°F | Wt 147.0 lb

## 2023-06-10 DIAGNOSIS — Z23 Encounter for immunization: Secondary | ICD-10-CM | POA: Diagnosis not present

## 2023-06-10 DIAGNOSIS — I10 Essential (primary) hypertension: Secondary | ICD-10-CM

## 2023-06-10 DIAGNOSIS — I951 Orthostatic hypotension: Secondary | ICD-10-CM | POA: Diagnosis not present

## 2023-06-10 MED ORDER — AMLODIPINE BESYLATE 2.5 MG PO TABS: 2.5000 mg | ORAL_TABLET | Freq: Every day | ORAL | 1 refills | Status: AC

## 2023-06-10 NOTE — Progress Notes (Signed)
OFFICE VISIT  06/29/2023  CC:  Chief Complaint  Patient presents with   Follow-up    Patient is a 77 y.o. male who presents for follow-up hypertension.  INTERIM HX: Patient called and reported home blood pressures were elevated so I restarted him on amlodipine 2.5 mg a day. BP 130/90 yesterday.  Says bp often low/normal and he has some dizziness in mornings but, a bit up in evenings.   Past Medical History:  Diagnosis Date   Atherosclerosis of coronary artery    On chest CT->calcif in LAD and RCA   Cerebrovascular disease    03/2023 MRI brain showed chronic microvasc isch changes and small remote infarcts   Chronic low back pain    MR 11/2021 showed L4-5 foraminal stenosis--> plan per Ortho was to do L4-5 injection   Chronic prostatitis    Very mild elevation of PSA (>4), referred to [facility] where PSA repeat was 1.0 on 06/16/13.  Annual PSA repeat is all that is needed now per urologist.  Most recent 07/2015 was 0.45.   Chronic renal insufficiency, stage III (moderate) (HCC)    Baseline GFR as of 2019= 50s.     Closed right hip fracture (HCC) 03/2021   THA   Diabetes mellitus with complication (HCC) Dx'd fall 2011   HTN (hypertension)    Renal/aortic doppler u/s normal 06/2010   Hypercalcemia    Hyperlipidemia 2016   Statin intolerant--myalgias.  Pt refuses any further trial of statin as of 2018.   Nonspecific abnormal electrocardiogram (ECG) (EKG) Fall 2011   TWI in inferior leads: myocardial perfusion scan neg and echo normal 07/2010   Pseudomonas aeruginosa colonization    12/2021 urine clx   TIA (transient ischemic attack)    12/2021- CT angio head/neck no high grade stenosis.  MR showed old lacunar infarcts corona radiata, basal ganglia, thalamus.  Echo normal.   Tobacco dependence    UTI (lower urinary tract infection)    Feb 2012 (klebsiella--dx at [facility]); 03/2013 e coli.     Past Surgical History:  Procedure Laterality Date   BIOPSY  06/16/2023   Procedure: BIOPSY;   Surgeon: Meridee Score Netty Starring., MD;  Location: [facility];  Service: Gastroenterology;;   CARDIOVASCULAR STRESS TEST  07/01/2010   Myocardial perfusion scan neg/low risk.   CATARACT EXTRACTION  2007, 2008   Bilateral ([facility] on [address].   COLONOSCOPY  12/20/04;12/2014   2016 no polyps: recall 5 yrs due to FH of colon ca   ESOPHAGOGASTRODUODENOSCOPY  11/29/2004   esoph stricture/dilation   ESOPHAGOGASTRODUODENOSCOPY N/A 06/16/2023   Procedure: ESOPHAGOGASTRODUODENOSCOPY (EGD);  Surgeon: Lemar Lofty., MD;  Location: [facility];  Service: Gastroenterology;  Laterality: N/A;   SAVORY DILATION N/A 06/16/2023   Procedure: SAVORY DILATION;  Surgeon: Meridee Score Netty Starring., MD;  Location: [facility];  Service: Gastroenterology;  Laterality: N/A;   TOTAL HIP ARTHROPLASTY Right 04/23/2021   Procedure: TOTAL HIP ARTHROPLASTY ANTERIOR APPROACH;  Surgeon: Cammy Copa, MD;  Location: [facility];  Service: Orthopedics;  Laterality: Right;  Hana, C-arm, Depuy   TRANSTHORACIC ECHOCARDIOGRAM  07/01/2010   2011 EF =>55%; LA mildly dilated; trace MR/TR.  12/2021 EF 60-65%, DD indeterm, valves nl.    Outpatient Medications Prior to Visit  Medication Sig Dispense Refill   clopidogrel (PLAVIX) 75 MG tablet Take 1 tablet (75 mg total) by mouth daily. 90 tablet 1   Docusate Calcium (STOOL SOFTENER PO) Take 1 tablet by mouth daily as needed (constipation).     ipratropium (  ATROVENT) 0.03 % nasal spray USE 2 SPRAY(S) IN EACH NOSTRIL EVERY 12 HOURS (Patient taking differently: Place 1 spray into both nostrils as needed for rhinitis.) 30 mL 11   OVER THE COUNTER MEDICATION Take 1 tablet by mouth daily. Elderberry, zinc, and vitamin C combo medication OTC.     polyethylene glycol (MIRALAX / GLYCOLAX) 17 g packet Take 17 g by mouth daily as needed for mild constipation.     sodium chloride 1 g tablet Take 1 tablet (1 g total) by mouth 2 (two) times daily with a meal. 60 tablet  1   traZODone (DESYREL) 50 MG tablet TAKE 1 TABLET BY MOUTH EVERY DAY AT BEDTIME AS NEEDED FOR SLEEP 90 tablet 1   pantoprazole (PROTONIX) 40 MG tablet Take 1 tablet (40 mg total) by mouth daily. 90 tablet 1   acetaminophen (TYLENOL) 500 MG tablet Take 1,000 mg by mouth as needed for moderate pain.     amLODipine (NORVASC) 2.5 MG tablet Take 1 tablet (2.5 mg total) by mouth daily. 30 tablet 1   No facility-administered medications prior to visit.    Allergies  Allergen Reactions   Bactrim [Sulfamethoxazole-Trimethoprim] Nausea Only    Review of Systems As per HPI  PE:    06/19/2023   10:32 AM 06/19/2023    4:55 AM 06/18/2023    9:29 PM  Vitals with BMI  Systolic 114 150 865  Diastolic 82 62 92  Pulse  65 67     Physical Exam  Gen: alert, chronically ill-appearing, sitting in WC. Lucid thought and speech   LABS:  Last metabolic panel Lab Results  Component Value Date   GLUCOSE 87 06/18/2023   NA 128 (L) 06/18/2023   K 3.6 06/18/2023   CL 97 (L) 06/18/2023   CO2 22 06/18/2023   BUN 7 (L) 06/18/2023   CREATININE 0.90 06/18/2023   GFR 58.54 (L) 05/14/2023   CALCIUM 8.7 (L) 06/18/2023   PHOS 3.7 04/24/2021   PROT 7.6 06/14/2023   ALBUMIN 4.2 06/14/2023   LABGLOB 2.3 02/16/2019   AGRATIO 1.9 02/16/2019   BILITOT 1.2 06/14/2023   ALKPHOS 141 (H) 06/14/2023   AST 19 06/14/2023   ALT 14 06/14/2023   ANIONGAP 9 06/18/2023   IMPRESSION AND PLAN:  HTN, hx ortho hypotension. Doing better on amlodipine 2.5mg , take at noon. Permissive HTN, goal <150/100.  An After Visit Summary was printed and given to the patient.  FOLLOW UP: Return for Keep appt already set for 9/24.  Signed:  Santiago Bumpers, MD           06/29/2023

## 2023-06-10 NOTE — Patient Instructions (Signed)
[facility] Address: [address], [place], [place] [address] Phone: [phone]  Check your blood pressure and heart rate daily, write down the numbers and bring to review during your next follow-up on 9/24.

## 2023-06-14 ENCOUNTER — Inpatient Hospital Stay (HOSPITAL_COMMUNITY)
Admission: EM | Admit: 2023-06-14 | Discharge: 2023-06-19 | DRG: 391 | Disposition: A | Discharge status: Skilled Nursing Facility | Payer: Medicare Other | Attending: Internal Medicine | Admitting: Internal Medicine

## 2023-06-14 ENCOUNTER — Encounter (HOSPITAL_COMMUNITY): Payer: Self-pay

## 2023-06-14 ENCOUNTER — Other Ambulatory Visit: Payer: Self-pay

## 2023-06-14 ENCOUNTER — Emergency Department (HOSPITAL_COMMUNITY): Payer: Medicare Other

## 2023-06-14 DIAGNOSIS — K3189 Other diseases of stomach and duodenum: Secondary | ICD-10-CM | POA: Diagnosis not present

## 2023-06-14 DIAGNOSIS — K922 Gastrointestinal hemorrhage, unspecified: Secondary | ICD-10-CM | POA: Diagnosis not present

## 2023-06-14 DIAGNOSIS — I2510 Atherosclerotic heart disease of native coronary artery without angina pectoris: Secondary | ICD-10-CM | POA: Diagnosis not present

## 2023-06-14 DIAGNOSIS — Z7902 Long term (current) use of antithrombotics/antiplatelets: Secondary | ICD-10-CM

## 2023-06-14 DIAGNOSIS — E785 Hyperlipidemia, unspecified: Secondary | ICD-10-CM | POA: Diagnosis present

## 2023-06-14 DIAGNOSIS — K2971 Gastritis, unspecified, with bleeding: Secondary | ICD-10-CM | POA: Diagnosis not present

## 2023-06-14 DIAGNOSIS — I129 Hypertensive chronic kidney disease with stage 1 through stage 4 chronic kidney disease, or unspecified chronic kidney disease: Secondary | ICD-10-CM | POA: Diagnosis not present

## 2023-06-14 DIAGNOSIS — Z800 Family history of malignant neoplasm of digestive organs: Secondary | ICD-10-CM

## 2023-06-14 DIAGNOSIS — K2950 Unspecified chronic gastritis without bleeding: Secondary | ICD-10-CM | POA: Diagnosis not present

## 2023-06-14 DIAGNOSIS — F17210 Nicotine dependence, cigarettes, uncomplicated: Secondary | ICD-10-CM | POA: Diagnosis not present

## 2023-06-14 DIAGNOSIS — N1831 Chronic kidney disease, stage 3a: Secondary | ICD-10-CM | POA: Diagnosis present

## 2023-06-14 DIAGNOSIS — K449 Diaphragmatic hernia without obstruction or gangrene: Secondary | ICD-10-CM | POA: Diagnosis present

## 2023-06-14 DIAGNOSIS — R1319 Other dysphagia: Secondary | ICD-10-CM

## 2023-06-14 DIAGNOSIS — K2970 Gastritis, unspecified, without bleeding: Secondary | ICD-10-CM

## 2023-06-14 DIAGNOSIS — Z833 Family history of diabetes mellitus: Secondary | ICD-10-CM

## 2023-06-14 DIAGNOSIS — E871 Hypo-osmolality and hyponatremia: Secondary | ICD-10-CM | POA: Diagnosis not present

## 2023-06-14 DIAGNOSIS — K222 Esophageal obstruction: Principal | ICD-10-CM | POA: Diagnosis present

## 2023-06-14 DIAGNOSIS — D62 Acute posthemorrhagic anemia: Secondary | ICD-10-CM | POA: Diagnosis present

## 2023-06-14 DIAGNOSIS — R0781 Pleurodynia: Secondary | ICD-10-CM | POA: Diagnosis not present

## 2023-06-14 DIAGNOSIS — Z96641 Presence of right artificial hip joint: Secondary | ICD-10-CM | POA: Diagnosis present

## 2023-06-14 DIAGNOSIS — Z043 Encounter for examination and observation following other accident: Secondary | ICD-10-CM | POA: Diagnosis not present

## 2023-06-14 DIAGNOSIS — K920 Hematemesis: Secondary | ICD-10-CM

## 2023-06-14 DIAGNOSIS — Z881 Allergy status to other antibiotic agents status: Secondary | ICD-10-CM

## 2023-06-14 DIAGNOSIS — N183 Chronic kidney disease, stage 3 unspecified: Secondary | ICD-10-CM | POA: Diagnosis present

## 2023-06-14 DIAGNOSIS — M25511 Pain in right shoulder: Secondary | ICD-10-CM | POA: Diagnosis not present

## 2023-06-14 DIAGNOSIS — Z8673 Personal history of transient ischemic attack (TIA), and cerebral infarction without residual deficits: Secondary | ICD-10-CM

## 2023-06-14 DIAGNOSIS — Z79899 Other long term (current) drug therapy: Secondary | ICD-10-CM | POA: Diagnosis not present

## 2023-06-14 DIAGNOSIS — R112 Nausea with vomiting, unspecified: Secondary | ICD-10-CM | POA: Diagnosis not present

## 2023-06-14 DIAGNOSIS — E1122 Type 2 diabetes mellitus with diabetic chronic kidney disease: Secondary | ICD-10-CM | POA: Diagnosis not present

## 2023-06-14 DIAGNOSIS — E861 Hypovolemia: Secondary | ICD-10-CM | POA: Diagnosis not present

## 2023-06-14 DIAGNOSIS — E876 Hypokalemia: Secondary | ICD-10-CM | POA: Diagnosis present

## 2023-06-14 DIAGNOSIS — R531 Weakness: Secondary | ICD-10-CM | POA: Diagnosis not present

## 2023-06-14 DIAGNOSIS — R1314 Dysphagia, pharyngoesophageal phase: Secondary | ICD-10-CM | POA: Diagnosis present

## 2023-06-14 DIAGNOSIS — K229 Disease of esophagus, unspecified: Secondary | ICD-10-CM | POA: Diagnosis not present

## 2023-06-14 DIAGNOSIS — F17200 Nicotine dependence, unspecified, uncomplicated: Secondary | ICD-10-CM | POA: Diagnosis not present

## 2023-06-14 DIAGNOSIS — M19011 Primary osteoarthritis, right shoulder: Secondary | ICD-10-CM | POA: Diagnosis not present

## 2023-06-14 LAB — URINALYSIS, ROUTINE W REFLEX MICROSCOPIC
Bilirubin Urine: NEGATIVE
Glucose, UA: NEGATIVE mg/dL
Hgb urine dipstick: NEGATIVE
Ketones, ur: NEGATIVE mg/dL
Leukocytes,Ua: NEGATIVE
Nitrite: NEGATIVE
Protein, ur: 30 mg/dL — AB
Specific Gravity, Urine: 1.014 (ref 1.005–1.030)
pH: 5 (ref 5.0–8.0)

## 2023-06-14 LAB — CBC
HCT: 45.7 % (ref 39.0–52.0)
Hemoglobin: 16.1 g/dL (ref 13.0–17.0)
MCH: 29.5 pg (ref 26.0–34.0)
MCHC: 35.2 g/dL (ref 30.0–36.0)
MCV: 83.7 fL (ref 80.0–100.0)
Platelets: 363 10*3/uL (ref 150–400)
RBC: 5.46 MIL/uL (ref 4.22–5.81)
RDW: 14.6 % (ref 11.5–15.5)
WBC: 8.3 10*3/uL (ref 4.0–10.5)
nRBC: 0 % (ref 0.0–0.2)

## 2023-06-14 LAB — COMPREHENSIVE METABOLIC PANEL
ALT: 14 U/L (ref 0–44)
AST: 19 U/L (ref 15–41)
Albumin: 4.2 g/dL (ref 3.5–5.0)
Alkaline Phosphatase: 141 U/L — ABNORMAL HIGH (ref 38–126)
Anion gap: 12 (ref 5–15)
BUN: 8 mg/dL (ref 8–23)
CO2: 22 mmol/L (ref 22–32)
Calcium: 9.2 mg/dL (ref 8.9–10.3)
Chloride: 90 mmol/L — ABNORMAL LOW (ref 98–111)
Creatinine, Ser: 1.25 mg/dL — ABNORMAL HIGH (ref 0.61–1.24)
GFR, Estimated: >60 mL/min (ref >60)
Glucose, Bld: 173 mg/dL — ABNORMAL HIGH (ref 70–99)
Potassium: 3.8 mmol/L (ref 3.5–5.1)
Sodium: 124 mmol/L — ABNORMAL LOW (ref 135–145)
Total Bilirubin: 1.2 mg/dL (ref 0.3–1.2)
Total Protein: 7.6 g/dL (ref 6.5–8.1)

## 2023-06-14 LAB — LIPASE, BLOOD: Lipase: 48 U/L (ref 11–51)

## 2023-06-14 MED ORDER — ACETAMINOPHEN 325 MG PO TABS
650.0000 mg | ORAL_TABLET | Freq: Four times a day (QID) | ORAL | Status: DC | PRN
  Administered 2023-06-17: 650 mg via ORAL
  Filled 2023-06-14 (×2): qty 2

## 2023-06-14 MED ORDER — ACETAMINOPHEN 650 MG RE SUPP: 650.0000 mg | Freq: Four times a day (QID) | RECTAL | Status: DC | PRN

## 2023-06-14 MED ORDER — ONDANSETRON HCL 4 MG PO TABS: 4.0000 mg | ORAL_TABLET | Freq: Four times a day (QID) | ORAL | Status: DC | PRN

## 2023-06-14 MED ORDER — LACTATED RINGERS IV SOLN: INTRAVENOUS | Status: DC

## 2023-06-14 MED ORDER — TRAZODONE HCL 50 MG PO TABS: 25.0000 mg | ORAL_TABLET | Freq: Every evening | ORAL | Status: DC | PRN

## 2023-06-14 MED ORDER — LACTATED RINGERS IV BOLUS
1000.0000 mL | Freq: Once | INTRAVENOUS | Status: AC
  Administered 2023-06-14: 1000 mL via INTRAVENOUS

## 2023-06-14 MED ORDER — ONDANSETRON HCL 4 MG/2ML IJ SOLN: 4.0000 mg | Freq: Four times a day (QID) | INTRAMUSCULAR | Status: DC | PRN

## 2023-06-14 MED ORDER — PANTOPRAZOLE SODIUM 40 MG IV SOLR
40.0000 mg | Freq: Once | INTRAVENOUS | Status: AC
  Administered 2023-06-14: 40 mg via INTRAVENOUS
  Filled 2023-06-14: qty 10

## 2023-06-14 MED ORDER — SODIUM CHLORIDE 1 G PO TABS
1.0000 g | ORAL_TABLET | Freq: Two times a day (BID) | ORAL | Status: DC
  Administered 2023-06-14 – 2023-06-19 (×10): 1 g via ORAL
  Filled 2023-06-14 (×13): qty 1

## 2023-06-14 NOTE — ED Triage Notes (Addendum)
Pt arrived via EMS, from home. C/o n/v since last night. States dark red blood in emesis. Denies any pain. Also c/o right shoulder and rib pain after mechanical fall x1 week ago.

## 2023-06-14 NOTE — H&P (Signed)
History and Physical  Antonio Serrano NKN:[id number] DOB: 1946-07-29 DOA: 06/14/2023  PCP: Jeoffrey Massed, MD   Chief Complaint: Coffee-ground emesis  HPI: Antonio Serrano is a 77 y.o. male with medical history significant for chronic hyponatremia, CKD stage III, hypertension, prior CVA on Plavix being admitted to the hospital with concern for upper GI bleed.  Patient states he has been having nausea for the last 4 days, with vomiting many times he eats.  Starting a couple days ago, he noticed that he had dark coffee-ground-like material in his emesis, did not see any bright red blood.  He denies prior history of any GI bleeding.  Denies chest pain, shortness of breath, or dizziness.  ED Course: Vital signs in the emergency department are unremarkable.  Lab work shows normal CBC, CMP with chronic hyponatremia and chronic renal insufficiency.  Review of Systems: Please see HPI for pertinent positives and negatives. A complete 10 system review of systems are otherwise negative.  Past Medical History:  Diagnosis Date   Atherosclerosis of coronary artery    On chest CT->calcif in LAD and RCA   Chronic low back pain    MR 11/2021 showed L4-5 foraminal stenosis--> plan per Ortho was to do L4-5 injection   Chronic prostatitis    Very mild elevation of PSA (>4), referred to [facility] where PSA repeat was 1.0 on 06/16/13.  Annual PSA repeat is all that is needed now per urologist.  Most recent 07/2015 was 0.45.   Chronic renal insufficiency, stage III (moderate) (HCC)    Baseline GFR as of 2019= 50s.     Closed right hip fracture (HCC) 03/2021   Antonio   Diabetes mellitus with complication (HCC) Dx'd fall 2011   HTN (hypertension)    Renal/aortic doppler u/s normal 06/2010   Hypercalcemia    Hyperlipidemia 2016   Statin intolerant--myalgias.  Pt refuses any further trial of statin as of 2018.   Nonspecific abnormal electrocardiogram (ECG) (EKG) Fall 2011   TWI in inferior leads: myocardial perfusion scan  neg and echo normal 07/2010   Pseudomonas aeruginosa colonization    12/2021 urine clx   TIA (transient ischemic attack)    12/2021- CT angio head/neck no high grade stenosis.  MR showed old lacunar infarcts corona radiata, basal ganglia, thalamus.  Echo normal.   Tobacco dependence    UTI (lower urinary tract infection)    Feb 2012 (klebsiella--dx at [facility]); 03/2013 e coli.    Past Surgical History:  Procedure Laterality Date   CARDIOVASCULAR STRESS TEST  07/01/2010   Myocardial perfusion scan neg/low risk.   CATARACT EXTRACTION  2007, 2008   Bilateral ([facility] on [address].   COLONOSCOPY  12/20/04;12/2014   2016 no polyps: recall 5 yrs due to FH of colon ca   ESOPHAGOGASTRODUODENOSCOPY  11/29/2004   esoph stricture/dilation   TOTAL HIP ARTHROPLASTY Right 04/23/2021   Procedure: TOTAL HIP ARTHROPLASTY ANTERIOR APPROACH;  Surgeon: Cammy Copa, MD;  Location: [facility];  Service: Orthopedics;  Laterality: Right;  Hana, C-arm, Depuy   TRANSTHORACIC ECHOCARDIOGRAM  07/01/2010   2011 EF =>55%; LA mildly dilated; trace MR/TR.  12/2021 EF 60-65%, DD indeterm, valves nl.    Social History:  reports that he has been smoking cigarettes. He has a 12.5 pack-year smoking history. He has never used smokeless tobacco. He reports that he does not currently use alcohol. He reports that he does not use drugs.   Allergies  Allergen Reactions   Bactrim [Sulfamethoxazole-Trimethoprim] Nausea Only  Family History  Problem Relation Age of Onset   Dementia Mother    Pneumonia Father        died in 56's of pneumonia, was otherwise healthy   Cancer Sister        colon cancer.  Died age 9   Diabetes Paternal Aunt    Diabetes Sister    Colon cancer Neg Hx      Prior to Admission medications   Medication Sig Start Date End Date Taking? Authorizing Provider  acetaminophen (TYLENOL) 500 MG tablet Take 1,000 mg by mouth as needed for moderate pain.   Yes [provider]  amLODipine (NORVASC) 2.5 MG tablet Take 1 tablet (2.5 mg total) by mouth daily. 06/10/23  Yes [name], [name], MD  ipratropium (ATROVENT) 0.03 % nasal spray USE 2 SPRAY(S) IN EACH NOSTRIL EVERY 12 HOURS Patient taking differently: Place 1 spray into both nostrils as needed for rhinitis. 09/13/21  Yes [name], [name], MD  traZODone (DESYREL) 50 MG tablet TAKE 1 TABLET BY MOUTH EVERY DAY AT BEDTIME AS NEEDED FOR SLEEP 05/14/23  Yes [name], [name], MD  clopidogrel (PLAVIX) 75 MG tablet Take 1 tablet (75 mg total) by mouth daily. 05/14/23   [name], [name], MD  Docusate Calcium (STOOL SOFTENER PO) Take 1 tablet by mouth daily as needed (constipation).    [provider]  OVER THE COUNTER MEDICATION Take 1 tablet by mouth daily. Elderberry, zinc, and vitamin C combo medication OTC.    [provider]  pantoprazole (PROTONIX) 40 MG tablet Take 1 tablet (40 mg total) by mouth daily. 05/14/23   [name], [name], MD  polyethylene glycol (MIRALAX / GLYCOLAX) 17 g packet Take 17 g by mouth daily as needed for mild constipation. 04/23/23   Pokhrel, Rebekah Chesterfield, MD  sodium chloride 1 g tablet Take 1 tablet (1 g total) by mouth 2 (two) times daily with a meal. 05/14/23   [name], [name], MD    Physical Exam: BP 114/77   Pulse 64   Temp (!) 97.3 F (36.3 C) (Oral)   Resp 17   SpO2 99%   General:  Alert, orientedx4, calm, in no acute distress  Eyes: EOMI, clear conjuctivae, white sclerea Neck: supple, no masses, trachea mildline  Cardiovascular: RRR, no murmurs or rubs, no peripheral edema  Respiratory: clear to auscultation bilaterally, no wheezes, no crackles  Abdomen: soft, nontender, nondistended, normal bowel tones heard  Skin: dry, no rashes  Musculoskeletal: no joint effusions, normal range of motion  Psychiatric: appropriate affect, normal speech  Neurologic: extraocular muscles intact, clear speech, moving all extremities with intact sensorium         Labs  on Admission:  Basic Metabolic Panel: Recent Labs  Lab 06/14/23 1242  NA 124*  K 3.8  CL 90*  CO2 22  GLUCOSE 173*  BUN 8  CREATININE 1.25*  CALCIUM 9.2   Liver Function Tests: Recent Labs  Lab 06/14/23 1242  AST 19  ALT 14  ALKPHOS 141*  BILITOT 1.2  PROT 7.6  ALBUMIN 4.2   Recent Labs  Lab 06/14/23 1242  LIPASE 48   No results for input(s): "AMMONIA" in the last 168 hours. CBC: Recent Labs  Lab 06/14/23 1242  WBC 8.3  HGB 16.1  HCT 45.7  MCV 83.7  PLT 363   Cardiac Enzymes: No results for input(s): "CKTOTAL", "CKMB", "CKMBINDEX", "TROPONINI" in the last 168 hours.  BNP (last 3 results) No results for input(s): "BNP" in the  last 8760 hours.  ProBNP (last 3 results) No results for input(s): "PROBNP" in the last 8760 hours.  CBG: No results for input(s): "GLUCAP" in the last 168 hours.  Radiological Exams on Admission: DG Ribs Unilateral W/Chest Right  Result Date: 06/14/2023 CLINICAL DATA:  Mechanical fall 1 week ago with persistent right shoulder and rib pain. EXAM: RIGHT RIBS AND CHEST - 3+ VIEW COMPARISON:  Right shoulder radiographs-earlier same day; chest radiograph-02/05/2022 FINDINGS: Old/healed right ninth rib fracture, similar to chest radiograph performed 02/05/2022. No acute displaced fractures. Regional soft tissues appear normal. No radiopaque foreign body. Limited visualization of the adjacent thorax is normal given obliquity and technique. Stigmata of dish within the thoracic spine. IMPRESSION: 1. No acute displaced fractures. 2. Old/healed right ninth rib fracture. Electronically Signed   By: Simonne Come M.D.   On: 06/14/2023 15:23   DG Shoulder Right  Result Date: 06/14/2023 CLINICAL DATA:  Mechanical fall 1 week ago with persistent right shoulder pain. EXAM: RIGHT SHOULDER - 2+ VIEW COMPARISON:  Right rib radiographic series-earlier same day FINDINGS: No fracture or dislocation. Glenohumeral joint spaces appear preserved. Moderate  degenerative change of the right AC joint with joint space loss, subchondral sclerosis and inferiorly directed osteophytosis. No evidence of calcific tendinitis. Old/healed right-sided rib fracture. Limited visualization of the adjacent thorax is otherwise normal. Regional soft tissues appear normal. IMPRESSION: 1. No acute findings. 2. Moderate degenerative change of the right AC joint. Electronically Signed   By: Simonne Come M.D.   On: 06/14/2023 15:21    Assessment/Plan Antonio Serrano is a 77 y.o. male with medical history significant for chronic hyponatremia, CKD stage III, hypertension, diabetes not on medication, prior CVA on Plavix being admitted to the hospital with concern for upper GI bleed.  Upper GI bleed-hemodynamically stable, with normal hemoglobin.  Concern due to nausea and coffee-ground emesis.  However no abdominal pain. -Inpatient admission -Avoid blood thinners -Clear liquid diet for now -EDP has consulted Dr. Elnoria Howard who will see in consultation -IV PPI  CKD stage III-renal function appears to be at baseline  Hyponatremia-chronic, asymptomatic, continue salt tabs  History of CVA-hold Plavix in the setting of suspected upper GI bleed  DVT prophylaxis: SCDs only     Code Status: Full Code  Consults called: GI Dr. Elnoria Howard  Admission status: The appropriate patient status for this patient is INPATIENT. Inpatient status is judged to be reasonable and necessary in order to provide the required intensity of service to ensure the patient's safety. The patient's presenting symptoms, physical exam findings, and initial radiographic and laboratory data in the context of their chronic comorbidities is felt to place them at high risk for further clinical deterioration. Furthermore, it is not anticipated that the patient will be medically stable for discharge from the hospital within 2 midnights of admission.    I certify that at the point of admission it is my clinical judgment that the  patient will require inpatient hospital care spanning beyond 2 midnights from the point of admission due to high intensity of service, high risk for further deterioration and high frequency of surveillance required  Time spent: 65 minutes  Burman Bruington Sharlette Dense MD [facility] Pager [phone]  If 7PM-7AM, please contact night-coverage [email or web address] Password [email or web address]  06/14/2023, 4:47 PM

## 2023-06-14 NOTE — Plan of Care (Signed)
Problem: Education: Goal: Knowledge of General Education information will improve Description: Including pain rating scale, medication(s)/side effects and non-pharmacologic comfort measures Outcome: Progressing   Problem: Clinical Measurements: Goal: Ability to maintain clinical measurements within normal limits will improve Outcome: Progressing   Problem: Health Behavior/Discharge Planning: Goal: Ability to manage health-related needs will improve Outcome: Not Progressing   Problem: Nutrition: Goal: Adequate nutrition will be maintained Outcome: Not Progressing

## 2023-06-14 NOTE — ED Provider Notes (Signed)
[place] EMERGENCY DEPARTMENT AT [facility] Provider Note   CSN: [id number] Arrival date & time: 06/14/23  1156     History  Chief Complaint  Patient presents with   Emesis    Antonio Serrano is a 77 y.o. male.  77 year old male who presents with emesis x 2 days.  States that it is been bloody in nature today.  Notes some weakness.  Denies any black stools.  No abdominal discomfort with it.  Patient does take Plavix for history of stroke in the past.  Denies any frank hematemesis.  No fever or chills.       Home Medications Prior to Admission medications   Medication Sig Start Date End Date Taking? Authorizing Provider  acetaminophen (TYLENOL) 500 MG tablet Take 1,000 mg by mouth as needed for moderate pain. Patient not taking: Reported on 05/14/2023    [provider]  amLODipine (NORVASC) 2.5 MG tablet Take 1 tablet (2.5 mg total) by mouth daily. 06/10/23   [name], [name], MD  clopidogrel (PLAVIX) 75 MG tablet Take 1 tablet (75 mg total) by mouth daily. 05/14/23   [name], [name], MD  Docusate Calcium (STOOL SOFTENER PO) Take 1 tablet by mouth daily as needed (constipation).    [provider]  ipratropium (ATROVENT) 0.03 % nasal spray USE 2 SPRAY(S) IN EACH NOSTRIL EVERY 12 HOURS Patient taking differently: Place 1 spray into both nostrils as needed for rhinitis. 09/13/21   [name], [name], MD  OVER THE COUNTER MEDICATION Take 1 tablet by mouth daily. Elderberry, zinc, and vitamin C combo medication OTC.    [provider]  pantoprazole (PROTONIX) 40 MG tablet Take 1 tablet (40 mg total) by mouth daily. 05/14/23   [name], [name], MD  polyethylene glycol (MIRALAX / GLYCOLAX) 17 g packet Take 17 g by mouth daily as needed for mild constipation. 04/23/23   Pokhrel, Rebekah Chesterfield, MD  sodium chloride 1 g tablet Take 1 tablet (1 g total) by mouth 2 (two) times daily with a meal. 05/14/23   [name], [name], MD  traZODone (DESYREL) 50 MG tablet  TAKE 1 TABLET BY MOUTH EVERY DAY AT BEDTIME AS NEEDED FOR SLEEP 05/14/23   [name], [name], MD      Allergies    Bactrim [sulfamethoxazole-trimethoprim]    Review of Systems   Review of Systems  All other systems reviewed and are negative.   Physical Exam Updated Vital Signs BP (!) 127/90   Pulse 79   Temp (!) 97.3 F (36.3 C) (Oral)   Resp 17   SpO2 99%  Physical Exam Vitals and nursing note reviewed.  Constitutional:      General: He is not in acute distress.    Appearance: Normal appearance. He is well-developed. He is not toxic-appearing.  HENT:     Head: Normocephalic and atraumatic.  Eyes:     General: Lids are normal.     Conjunctiva/sclera: Conjunctivae normal.     Pupils: Pupils are equal, round, and reactive to light.  Neck:     Thyroid: No thyroid mass.     Trachea: No tracheal deviation.  Cardiovascular:     Rate and Rhythm: Normal rate and regular rhythm.     Heart sounds: Normal heart sounds. No murmur heard.    No gallop.  Pulmonary:     Effort: Pulmonary effort is normal. No respiratory distress.     Breath sounds: Normal breath sounds. No stridor. No decreased breath sounds, wheezing, rhonchi or  rales.  Abdominal:     General: There is no distension.     Palpations: Abdomen is soft.     Tenderness: There is no abdominal tenderness. There is no rebound.  Musculoskeletal:        General: No tenderness. Normal range of motion.     Cervical back: Normal range of motion and neck supple.  Skin:    General: Skin is warm and dry.     Findings: No abrasion or rash.  Neurological:     Mental Status: He is alert and oriented to person, place, and time. Mental status is at baseline.     GCS: GCS eye subscore is 4. GCS verbal subscore is 5. GCS motor subscore is 6.     Cranial Nerves: Cranial nerves are intact. No cranial nerve deficit.     Sensory: No sensory deficit.     Motor: Motor function is intact.  Psychiatric:        Attention and Perception:  Attention normal.        Speech: Speech normal.        Behavior: Behavior normal.     ED Results / Procedures / Treatments   Labs (all labs ordered are listed, but only abnormal results are displayed) Labs Reviewed  COMPREHENSIVE METABOLIC PANEL - Abnormal; Notable for the following components:      Result Value   Sodium 124 (*)    Chloride 90 (*)    Glucose, Bld 173 (*)    Creatinine, Ser 1.25 (*)    Alkaline Phosphatase 141 (*)    All other components within normal limits  LIPASE, BLOOD  CBC  URINALYSIS, ROUTINE W REFLEX MICROSCOPIC    EKG None  Radiology DG Ribs Unilateral W/Chest Right  Result Date: 06/14/2023 CLINICAL DATA:  Mechanical fall 1 week ago with persistent right shoulder and rib pain. EXAM: RIGHT RIBS AND CHEST - 3+ VIEW COMPARISON:  Right shoulder radiographs-earlier same day; chest radiograph-02/05/2022 FINDINGS: Old/healed right ninth rib fracture, similar to chest radiograph performed 02/05/2022. No acute displaced fractures. Regional soft tissues appear normal. No radiopaque foreign body. Limited visualization of the adjacent thorax is normal given obliquity and technique. Stigmata of dish within the thoracic spine. IMPRESSION: 1. No acute displaced fractures. 2. Old/healed right ninth rib fracture. Electronically Signed   By: Simonne Come M.D.   On: 06/14/2023 15:23   DG Shoulder Right  Result Date: 06/14/2023 CLINICAL DATA:  Mechanical fall 1 week ago with persistent right shoulder pain. EXAM: RIGHT SHOULDER - 2+ VIEW COMPARISON:  Right rib radiographic series-earlier same day FINDINGS: No fracture or dislocation. Glenohumeral joint spaces appear preserved. Moderate degenerative change of the right AC joint with joint space loss, subchondral sclerosis and inferiorly directed osteophytosis. No evidence of calcific tendinitis. Old/healed right-sided rib fracture. Limited visualization of the adjacent thorax is otherwise normal. Regional soft tissues appear normal.  IMPRESSION: 1. No acute findings. 2. Moderate degenerative change of the right AC joint. Electronically Signed   By: Simonne Come M.D.   On: 06/14/2023 15:21    Procedures Procedures    Medications Ordered in ED Medications  lactated ringers bolus 1,000 mL (has no administration in time range)  lactated ringers infusion (has no administration in time range)  pantoprazole (PROTONIX) injection 40 mg (has no administration in time range)    ED Course/ Medical Decision Making/ A&P  Medical Decision Making Amount and/or Complexity of Data Reviewed Labs: ordered. Radiology: ordered.  Risk Prescription drug management.   Patient likely upper GI bleed and is on blood thinners.  Started on IV Protonix.  Will send secure chat message to GI and admit to the medicine service        Final Clinical Impression(s) / ED Diagnoses Final diagnoses:  None    Rx / DC Orders ED Discharge Orders     None         Lorre Nick, MD 06/14/23 1626

## 2023-06-15 DIAGNOSIS — R1319 Other dysphagia: Secondary | ICD-10-CM

## 2023-06-15 DIAGNOSIS — D62 Acute posthemorrhagic anemia: Secondary | ICD-10-CM | POA: Insufficient documentation

## 2023-06-15 DIAGNOSIS — N1831 Chronic kidney disease, stage 3a: Secondary | ICD-10-CM

## 2023-06-15 DIAGNOSIS — K920 Hematemesis: Secondary | ICD-10-CM

## 2023-06-15 DIAGNOSIS — K922 Gastrointestinal hemorrhage, unspecified: Secondary | ICD-10-CM | POA: Diagnosis not present

## 2023-06-15 DIAGNOSIS — E871 Hypo-osmolality and hyponatremia: Secondary | ICD-10-CM

## 2023-06-15 DIAGNOSIS — Z7902 Long term (current) use of antithrombotics/antiplatelets: Secondary | ICD-10-CM | POA: Diagnosis not present

## 2023-06-15 LAB — BASIC METABOLIC PANEL
Anion gap: 9 (ref 5–15)
BUN: 9 mg/dL (ref 8–23)
CO2: 24 mmol/L (ref 22–32)
Calcium: 8.6 mg/dL — ABNORMAL LOW (ref 8.9–10.3)
Chloride: 95 mmol/L — ABNORMAL LOW (ref 98–111)
Creatinine, Ser: 1.13 mg/dL (ref 0.61–1.24)
GFR, Estimated: >60 mL/min (ref >60)
Glucose, Bld: 93 mg/dL (ref 70–99)
Potassium: 3.2 mmol/L — ABNORMAL LOW (ref 3.5–5.1)
Sodium: 128 mmol/L — ABNORMAL LOW (ref 135–145)

## 2023-06-15 LAB — CBC
HCT: 40.4 % (ref 39.0–52.0)
Hemoglobin: 14.1 g/dL (ref 13.0–17.0)
MCH: 29.7 pg (ref 26.0–34.0)
MCHC: 34.9 g/dL (ref 30.0–36.0)
MCV: 85.2 fL (ref 80.0–100.0)
Platelets: 281 10*3/uL (ref 150–400)
RBC: 4.74 MIL/uL (ref 4.22–5.81)
RDW: 14.6 % (ref 11.5–15.5)
WBC: 6 10*3/uL (ref 4.0–10.5)
nRBC: 0 % (ref 0.0–0.2)

## 2023-06-15 LAB — PROTIME-INR
INR: 1.1 (ref 0.8–1.2)
Prothrombin Time: 14.1 s (ref 11.4–15.2)

## 2023-06-15 MED ORDER — PANTOPRAZOLE SODIUM 40 MG PO TBEC
40.0000 mg | DELAYED_RELEASE_TABLET | Freq: Every day | ORAL | Status: DC
  Administered 2023-06-15: 40 mg via ORAL
  Filled 2023-06-15: qty 1

## 2023-06-15 MED ORDER — POTASSIUM CHLORIDE IN NACL 40-0.9 MEQ/L-% IV SOLN
INTRAVENOUS | Status: DC
  Filled 2023-06-15 (×2): qty 1000

## 2023-06-15 MED ORDER — HYDRALAZINE HCL 10 MG PO TABS: 10.0000 mg | ORAL_TABLET | Freq: Four times a day (QID) | ORAL | Status: DC | PRN

## 2023-06-15 MED ORDER — AMLODIPINE BESYLATE 5 MG PO TABS
2.5000 mg | ORAL_TABLET | Freq: Every day | ORAL | Status: DC
  Administered 2023-06-15 – 2023-06-19 (×4): 2.5 mg via ORAL
  Filled 2023-06-15 (×5): qty 1

## 2023-06-15 MED ORDER — TRAZODONE HCL 50 MG PO TABS
50.0000 mg | ORAL_TABLET | Freq: Every evening | ORAL | Status: DC | PRN
  Administered 2023-06-15 – 2023-06-18 (×2): 50 mg via ORAL
  Filled 2023-06-15 (×2): qty 1

## 2023-06-15 NOTE — Progress Notes (Signed)
[facility] PROGRESS NOTE    Progress Note  Antonio Serrano  UUV:[id number] DOB: [birth year] DOA: 06/14/2023 PCP: Jeoffrey Massed, MD     Brief Narrative:   Antonio Serrano is an 77 y.o. male past medical history significant for chronic hyponatremia, chronic kidney stage III, essential hypertension, prior history of CVA on Plavix admitted with an upper GI bleed was having nausea and vomiting for at least 4 days prior to admission 1 day prior to admission started having hematemesis, and eventually coffee-ground.  He does relate he had about a fall about 10 to 7 days prior to admission where he hit his chest, chest x-ray showed a nondisplaced fracture and old here ninth rib fracture   Assessment/Plan:   Upper GI bleed: Plavix has been held.  He denies any NSAID use. Currently NPO. Started on IV Protonix twice a day.  Check PT and INR GI has been consulted.  His hemoglobin has dropped from 16-14, which is about his previous range. Change IV fluids to normal saline with potassium supplements recheck basic metabolic panel in the morning. Continue to monitor hemoglobin. He relates he is uses dentures but he does not relate any cuts or sores.  Question Mallory-Weiss tears. Denies any recent diarrhea or gastroenteritis.  Hypovolemic hyponatremia: Appears to be chronic currently asymptomatic on salt tablets at home.  Baseline leg around 128. Change IV fluids to normal saline sodium has improved continue to monitor.  Chronic kidney see stage II: With a baseline creatinine of around 1 on admission 1.2 continue to monitor closely.  Type 2 diabetes mellitus with stage 3a chronic kidney disease, without long-term current use of insulin  With a last A1c of 6.5 about a year ago. Continue check CBGs closely he is on no oral hypoglycemic agents at home.   DVT prophylaxis: scd Family Communication:none Status is: Inpatient Remains inpatient appropriate because: Acute upper GI  bleed    Code Status:     Code Status Orders  (From admission, onward)           Start     Ordered   06/14/23 1642  Full code  Continuous       Question:  By:  Answer:  Consent: discussion documented in EHR   06/14/23 1642           Code Status History     Date Active Date Inactive Code Status Order ID Comments User Context   04/19/2023 1611 04/24/2023 0045 Full Code [id number]  Nolberto Hanlon, MD ED   01/04/2022 1529 01/05/2022 2003 Full Code [id number]  Emeline General, MD ED   04/23/2021 0118 04/25/2021 1649 Full Code [id number]  Marlow Baars, MD ED      Advance Directive Documentation    Flowsheet Row Most Recent Value  Type of Advance Directive Living will  Pre-existing out of facility DNR order (yellow form or pink MOST form) --  "MOST" Form in Place? --         IV Access:   Peripheral IV   Procedures and diagnostic studies:   DG Ribs Unilateral W/Chest Right  Result Date: 06/14/2023 CLINICAL DATA:  Mechanical fall 1 week ago with persistent right shoulder and rib pain. EXAM: RIGHT RIBS AND CHEST - 3+ VIEW COMPARISON:  Right shoulder radiographs-earlier same day; chest radiograph-02/05/2022 FINDINGS: Old/healed right ninth rib fracture, similar to chest radiograph performed 02/05/2022. No acute displaced fractures. Regional soft tissues appear normal. No radiopaque foreign body. Limited visualization of the adjacent thorax is normal given obliquity  and technique. Stigmata of dish within the thoracic spine. IMPRESSION: 1. No acute displaced fractures. 2. Old/healed right ninth rib fracture. Electronically Signed   By: Simonne Come M.D.   On: 06/14/2023 15:23   DG Shoulder Right  Result Date: 06/14/2023 CLINICAL DATA:  Mechanical fall 1 week ago with persistent right shoulder pain. EXAM: RIGHT SHOULDER - 2+ VIEW COMPARISON:  Right rib radiographic series-earlier same day FINDINGS: No fracture or dislocation. Glenohumeral joint spaces appear preserved. Moderate  degenerative change of the right AC joint with joint space loss, subchondral sclerosis and inferiorly directed osteophytosis. No evidence of calcific tendinitis. Old/healed right-sided rib fracture. Limited visualization of the adjacent thorax is otherwise normal. Regional soft tissues appear normal. IMPRESSION: 1. No acute findings. 2. Moderate degenerative change of the right AC joint. Electronically Signed   By: Simonne Come M.D.   On: 06/14/2023 15:21     Medical Consultants:   None.   Subjective:    Antonio Serrano no complaints relates he is hungry this morning.  Objective:    Vitals:   06/14/23 2009 06/15/23 0011 06/15/23 0435 06/15/23 0438  BP:  (!) 159/92 (!) 171/94 (!) 169/94  Pulse:  66 61 (!) 58  Resp:  18 15   Temp:  97.7 F (36.5 C) 97.7 F (36.5 C)   TempSrc:  Oral Oral   SpO2:  100% 99%   Weight: 64 kg     Height: 5\' 6"  (1.676 m)      SpO2: 99 %   Intake/Output Summary (Last 24 hours) at 06/15/2023 0715 Last data filed at 06/15/2023 0865 Gross per 24 hour  Intake 1241.52 ml  Output 900 ml  Net 341.52 ml   Filed Weights   06/14/23 2009  Weight: 64 kg    Exam: General exam: In no acute distress. Respiratory system: Good air movement and clear to auscultation. Cardiovascular system: S1 & S2 heard, RRR. No JVD. Gastrointestinal system: Abdomen is nondistended, soft and nontender.  Extremities: No pedal edema. Skin: Wounds along the right side of the chest around T4 level Psychiatry: Judgement and insight appear normal. Mood & affect appropriate.    Data Reviewed:    Labs: Basic Metabolic Panel: Recent Labs  Lab 06/14/23 1242 06/15/23 0446  NA 124* 128*  K 3.8 3.2*  CL 90* 95*  CO2 22 24  GLUCOSE 173* 93  BUN 8 9  CREATININE 1.25* 1.13  CALCIUM 9.2 8.6*   GFR Estimated Creatinine Clearance: 51 mL/min (by C-G formula based on SCr of 1.13 mg/dL). Liver Function Tests: Recent Labs  Lab 06/14/23 1242  AST 19  ALT 14  ALKPHOS 141*   BILITOT 1.2  PROT 7.6  ALBUMIN 4.2   Recent Labs  Lab 06/14/23 1242  LIPASE 48   No results for input(s): "AMMONIA" in the last 168 hours. Coagulation profile No results for input(s): "INR", "PROTIME" in the last 168 hours. COVID-19 Labs  No results for input(s): "DDIMER", "FERRITIN", "LDH", "CRP" in the last 72 hours.  Lab Results  Component Value Date   SARSCOV2NAA NEGATIVE 01/04/2022   SARSCOV2NAA NEGATIVE 04/23/2021   SARSCOV2NAA NEGATIVE 02/04/2021    CBC: Recent Labs  Lab 06/14/23 1242 06/15/23 0446  WBC 8.3 6.0  HGB 16.1 14.1  HCT 45.7 40.4  MCV 83.7 85.2  PLT 363 281   Cardiac Enzymes: No results for input(s): "CKTOTAL", "CKMB", "CKMBINDEX", "TROPONINI" in the last 168 hours. BNP (last 3 results) No results for input(s): "PROBNP" in the last 8760 hours.  CBG: No results for input(s): "GLUCAP" in the last 168 hours. D-Dimer: No results for input(s): "DDIMER" in the last 72 hours. Hgb A1c: No results for input(s): "HGBA1C" in the last 72 hours. Lipid Profile: No results for input(s): "CHOL", "HDL", "LDLCALC", "TRIG", "CHOLHDL", "LDLDIRECT" in the last 72 hours. Thyroid function studies: No results for input(s): "TSH", "T4TOTAL", "T3FREE", "THYROIDAB" in the last 72 hours.  Invalid input(s): "FREET3" Anemia work up: No results for input(s): "VITAMINB12", "FOLATE", "FERRITIN", "TIBC", "IRON", "RETICCTPCT" in the last 72 hours. Sepsis Labs: Recent Labs  Lab 06/14/23 1242 06/15/23 0446  WBC 8.3 6.0   Microbiology No results found for this or any previous visit (from the past 240 hour(s)).   Medications:    sodium chloride  1 g Oral BID WC   Continuous Infusions:  lactated ringers 125 mL/hr at 06/15/23 0652      LOS: 1 day   Marinda Elk  [facility]  06/15/2023, 7:15 AM

## 2023-06-15 NOTE — H&P (View-Only) (Signed)
Gastroenterology Inpatient Consultation   Attending Requesting Consult Marinda Elk, MD  [facility] Day: 2  Reason for Consult CGE/Hematemesis on AntiPLT therapy while nauseated    History of Present Illness  Antonio Serrano is a 77 y.o. male with a pmh significant for prior CVA (on Plavix), hypertension, CRI, hyperlipidemia, chronic hyponatremia, diverticulosis, hemorrhoids.  The GI service is consulted for evaluation and management of coffee-ground emesis/hematemesis on antiplatelet therapy.  The patient presents with 4 to 5 days worth of significant nausea vomiting and abdominal discomfort.  He has had bilious vomiting until the day of his presentation when he had an episode of bright red blood/hematemesis and coffee-ground emesis.  He does not take nonsteroidals on a regular basis.  He has never had anything like this previously although when you look in the chart he ended up coming into the hospital in July and was having nausea and vomiting as well but he did not recall this.  Patient has had a previous endoscopy though it has been 18 years.  He has had mild episodes of dysphagia but nothing like it was back in 2006 before his dilation.  His hemoglobin has been documented while here in the emergency department and in the hospital and has down trended slightly.  BUN not significantly elevated.  He would like to try to know why he had this episode of bleeding if possible.  GI Review of Systems Positive as above Negative for odynophagia, change in bowel habits, melena, hematochezia   Review of Systems  General: Denies fevers/chills/weight loss unintentionally Cardiovascular: Denies chest pain Pulmonary: Denies shortness of breath Gastroenterological: See HPI Genitourinary: Denies darkened urine Hematological: Denies easy bruising/bleeding Dermatological: Denies jaundice Psychological: Mood is stable   Histories  Past Medical History Past Medical History:   Diagnosis Date   Atherosclerosis of coronary artery    On chest CT->calcif in LAD and RCA   Chronic low back pain    MR 11/2021 showed L4-5 foraminal stenosis--> plan per Ortho was to do L4-5 injection   Chronic prostatitis    Very mild elevation of PSA (>4), referred to [facility] where PSA repeat was 1.0 on 06/16/13.  Annual PSA repeat is all that is needed now per urologist.  Most recent 07/2015 was 0.45.   Chronic renal insufficiency, stage III (moderate) (HCC)    Baseline GFR as of 2019= 50s.     Closed right hip fracture (HCC) 03/2021   THA   Diabetes mellitus with complication (HCC) Dx'd fall 2011   HTN (hypertension)    Renal/aortic doppler u/s normal 06/2010   Hypercalcemia    Hyperlipidemia 2016   Statin intolerant--myalgias.  Pt refuses any further trial of statin as of 2018.   Nonspecific abnormal electrocardiogram (ECG) (EKG) Fall 2011   TWI in inferior leads: myocardial perfusion scan neg and echo normal 07/2010   Pseudomonas aeruginosa colonization    12/2021 urine clx   TIA (transient ischemic attack)    12/2021- CT angio head/neck no high grade stenosis.  MR showed old lacunar infarcts corona radiata, basal ganglia, thalamus.  Echo normal.   Tobacco dependence    UTI (lower urinary tract infection)    Feb 2012 (klebsiella--dx at [facility]); 03/2013 e coli.    Past Surgical History:  Procedure Laterality Date   CARDIOVASCULAR STRESS TEST  07/01/2010   Myocardial perfusion scan neg/low risk.   CATARACT EXTRACTION  2007, 2008   Bilateral ([facility] on [address].   COLONOSCOPY  12/20/04;12/2014  2016 no polyps: recall 5 yrs due to FH of colon ca   ESOPHAGOGASTRODUODENOSCOPY  11/29/2004   esoph stricture/dilation   TOTAL HIP ARTHROPLASTY Right 04/23/2021   Procedure: TOTAL HIP ARTHROPLASTY ANTERIOR APPROACH;  Surgeon: Cammy Copa, MD;  Location: [facility];  Service: Orthopedics;  Laterality: Right;  Hana, C-arm, Depuy   TRANSTHORACIC ECHOCARDIOGRAM   07/01/2010   2011 EF =>55%; Antonio mildly dilated; trace MR/TR.  12/2021 EF 60-65%, DD indeterm, valves nl.    Allergies Allergies  Allergen Reactions   Bactrim [Sulfamethoxazole-Trimethoprim] Nausea Only    Family History Family History  Problem Relation Age of Onset   Dementia Mother    Pneumonia Father        died in 47's of pneumonia, was otherwise healthy   Cancer Sister        colon cancer.  Died age 69   Diabetes Paternal Aunt    Diabetes Sister    Colon cancer Neg Hx     Social History Social History   Socioeconomic History   Marital status: Married    Spouse name: Not on file   Number of children: Not on file   Years of education: Not on file   Highest education level: Not on file  Occupational History   Not on file  Tobacco Use   Smoking status: Every Day    Current packs/day: 0.25    Average packs/day: 0.3 packs/day for 50.0 years (12.5 ttl pk-yrs)    Types: Cigarettes   Smokeless tobacco: Never  Vaping Use   Vaping status: Never Used  Substance and Sexual Activity   Alcohol use: Not Currently    Alcohol/week: 0.0 standard drinks of alcohol    Comment: rare alcohol   Drug use: No   Sexual activity: Not on file  Other Topics Concern   Not on file  Social History Narrative   Married, 4 grown children, 6 grandchildren.   Works in Production designer, theatre/television/film at [employer] in [place], [place].  Worked in [employer] all his life.   Lives in [place].   25 pack-yr tobacco hx--current as of 05/2014.   Drinks about a six pack per week.  Enjoys golf--six handicap at one point.  No formal exercise.   Walks a lot for his job.         Social Determinants of Health   Financial Resource Strain: Low Risk  (11/28/2022)   Overall Financial Resource Strain (CARDIA)    Difficulty of Paying Living Expenses: Not hard at all  Food Insecurity: No Food Insecurity (06/14/2023)   Hunger Vital Sign    Worried About Running Out of Food in the Last Year: Never true    Ran Out of Food in  the Last Year: Never true  Transportation Needs: No Transportation Needs (06/14/2023)   PRAPARE - Administrator, Civil Service (Medical): No    Lack of Transportation (Non-Medical): No  Physical Activity: Insufficiently Active (11/28/2022)   Exercise Vital Sign    Days of Exercise per Week: 5 days    Minutes of Exercise per Session: 20 min  Stress: No Stress Concern Present (11/28/2022)   [employer] of Occupational Health - Occupational Stress Questionnaire    Feeling of Stress : Not at all  Social Connections: Moderately Isolated (11/28/2022)   Social Connection and Isolation Panel [NHANES]    Frequency of Communication with Friends and Family: More than three times a week    Frequency of Social Gatherings with Friends and Family: Once  a week    Attends Religious Services: Never    Active Member of Clubs or Organizations: No    Attends Banker Meetings: Never    Marital Status: Married  Catering manager Violence: Not At Risk (06/14/2023)   Humiliation, Afraid, Rape, and Kick questionnaire    Fear of Current or Ex-Partner: No    Emotionally Abused: No    Physically Abused: No    Sexually Abused: No    Medications  Home Medications No current facility-administered medications on file prior to encounter.   Current Outpatient Medications on File Prior to Encounter  Medication Sig Dispense Refill   acetaminophen (TYLENOL) 500 MG tablet Take 1,000 mg by mouth as needed for moderate pain.     amLODipine (NORVASC) 2.5 MG tablet Take 1 tablet (2.5 mg total) by mouth daily. 90 tablet 1   clopidogrel (PLAVIX) 75 MG tablet Take 1 tablet (75 mg total) by mouth daily. 90 tablet 1   ipratropium (ATROVENT) 0.03 % nasal spray USE 2 SPRAY(S) IN EACH NOSTRIL EVERY 12 HOURS (Patient taking differently: Place 1 spray into both nostrils as needed for rhinitis.) 30 mL 11   OVER THE COUNTER MEDICATION Take 1 tablet by mouth daily. Elderberry, zinc, and vitamin C combo  medication OTC.     pantoprazole (PROTONIX) 40 MG tablet Take 1 tablet (40 mg total) by mouth daily. 90 tablet 1   sodium chloride 1 g tablet Take 1 tablet (1 g total) by mouth 2 (two) times daily with a meal. 60 tablet 1   traZODone (DESYREL) 50 MG tablet TAKE 1 TABLET BY MOUTH EVERY DAY AT BEDTIME AS NEEDED FOR SLEEP 90 tablet 1   Docusate Calcium (STOOL SOFTENER PO) Take 1 tablet by mouth daily as needed (constipation). (Patient not taking: Reported on 06/14/2023)     polyethylene glycol (MIRALAX / GLYCOLAX) 17 g packet Take 17 g by mouth daily as needed for mild constipation. (Patient not taking: Reported on 06/14/2023)     Scheduled Inpatient Medications  sodium chloride  1 g Oral BID WC   Continuous Inpatient Infusions  0.9 % NaCl with KCl 40 mEq / L     PRN Inpatient Medications acetaminophen **OR** acetaminophen, ondansetron **OR** ondansetron (ZOFRAN) IV, traZODone   Physical Examination  BP (!) 169/94 (BP Location: Right Arm)   Pulse (!) 58   Temp 97.7 F (36.5 C) (Oral)   Resp 15   Ht 5\' 6"  (1.676 m)   Wt 64 kg   SpO2 99%   BMI 22.77 kg/m  GEN: NAD, appears stated age, doesn't appear chronically ill PSYCH: Cooperative, without pressured speech EYE: Conjunctivae pink, sclerae anicteric ENT: MMM CV: Nontachycardic RESP: No audible wheezing GI: NABS, soft, protuberant abdomen, rounded, nontender, no rebound or guarding MSK/EXT: No significant lower extremity edema SKIN: Bilateral pedal edema is present NEURO:  Alert & Oriented x 3, no focal deficits   Review of Data  I reviewed the following data at the time of this encounter:  Laboratory Studies   Recent Labs  Lab 06/15/23 0446  NA 128*  K 3.2*  CL 95*  CO2 24  BUN 9  CREATININE 1.13  GLUCOSE 93  CALCIUM 8.6*   Recent Labs  Lab 06/14/23 1242  AST 19  ALT 14  ALKPHOS 141*    Recent Labs  Lab 06/14/23 1242 06/15/23 0446  WBC 8.3 6.0  HGB 16.1 14.1  HCT 45.7 40.4  PLT 363 281   No results  for input(s): "  APTT", "INR" in the last 168 hours.  Imaging Studies  July 2024 CT abdomen pelvis without contrast IMPRESSION: 1. No acute localizing process in the abdomen or pelvis. 2. Small hiatal hernia. 3. Stable right renal atrophy. 4. Prostatomegaly.  GI Procedures and Studies  Prior EGD in 2006 With report of a distal esophageal stenosis dilated with Elease Hashimoto with moderate resistance.  No other findings.  Assessment  Antonio Serrano is a 77 y.o. male with a pmh significant for prior CVA (on Plavix), hypertension, CRI, hyperlipidemia, chronic hyponatremia, diverticulosis, hemorrhoids.  The GI service is consulted for evaluation and management of coffee-ground emesis/hematemesis on antiplatelet therapy.  The patient is hemodynamically and clinically stable at this time.  The etiology of his nausea and vomiting and abdominal discomfort is not clearly defined based on his evaluation to peptic ulcer disease is certainly possible.  I do suspect that he probably had some sort of viral/microbial/chemical gastroenteritis however.  With this being said, I do think it is reasonable for an upper endoscopy to be pursued as he also describes some intermittent episodes of dysphagia.  Pending anesthesia not being available for an endoscopy today, he will be scheduled for an endoscopy tomorrow.  He can be on full liquid diet today.  The risks and benefits of endoscopic evaluation were discussed with the patient; these include but are not limited to the risk of perforation, infection, bleeding, missed lesions, lack of diagnosis, severe illness requiring hospitalization, as well as anesthesia and sedation related illnesses.  The patient and/or family is agreeable to proceed.  I think it is important for [place] to do this since he needs to go back on antiplatelet therapy.  His chronic hyponatremia should hopefully not be an issue for [place] in regards to sedation.  All patient questions were answered to the best of my  ability, and the patient agrees to the aforementioned plan of action with follow-up as indicated.   Plan/Recommendations  No further anesthesia time available today for endoscopic procedures Full liquid diet today N.p.o. at midnight (other than for medications) EGD diagnostic with possible therapeutic intent for 9/15 Continue PPI twice daily   Thank you for this consult.  We will continue to follow.  Please page/call with questions or concerns.   Corliss Parish, MD [facility] Office # [phone]

## 2023-06-15 NOTE — Progress Notes (Signed)
Mobility Specialist - Progress Note   06/15/23 1424  Mobility  Activity Ambulated with assistance in hallway  Level of Assistance Minimal assist, patient does 75% or more  Assistive Device Front wheel walker  Distance Ambulated (ft) 50 ft  Range of Motion/Exercises Active Assistive  Activity Response Tolerated fair  Mobility Referral Yes  $Mobility charge 1 Mobility  Mobility Specialist Start Time (ACUTE ONLY) 1405  Mobility Specialist Stop Time (ACUTE ONLY) 1424  Mobility Specialist Time Calculation (min) (ACUTE ONLY) 19 min   Pt was found in bed and agreeable to ambulate. Was min-A for bed mobility and STS. CG for ambulation. C/o pain from R leg during session and walked with a limp. At EOS returned to bed with all needs met. Call bell in reach.   Billey Chang Mobility Specialist

## 2023-06-15 NOTE — Anesthesia Preprocedure Evaluation (Signed)
Anesthesia Evaluation  Patient identified by MRN, date of birth, ID band Patient awake    Reviewed: Allergy & Precautions, NPO status , Patient's Chart, lab work & pertinent test results  Airway Mallampati: III  TM Distance: <3 FB Neck ROM: Full    Dental no notable dental hx. (+) Upper Dentures, Missing,    Pulmonary Current Smoker and Patient abstained from smoking.   Pulmonary exam normal breath sounds clear to auscultation       Cardiovascular hypertension, Pt. on medications + CAD  Normal cardiovascular exam Rhythm:Regular Rate:Normal  03/2022 Echo  1. Left ventricular ejection fraction, by estimation, is 60 to 65%. The  left ventricle has normal function. The left ventricle has no regional  wall motion abnormalities. There is mild left ventricular hypertrophy.  Left ventricular diastolic parameters  are indeterminate.     Neuro/Psych CVA (on plavix R leg weakness), Residual Symptoms    GI/Hepatic   Endo/Other  diabetes    Renal/GU Renal InsufficiencyRenal diseaseLab Results      Component                Value               Date                      NA                       128 (L)             06/15/2023                         K                        3.2 (L)             06/15/2023                     CREATININE               1.13                06/15/2023                GFRNONAA                 >60                 06/15/2023                    GLUCOSE                  93                  06/15/2023                Musculoskeletal   Abdominal   Peds  Hematology Lab Results      Component                Value               Date                      WBC                      6.0  06/15/2023                HGB                      14.1                06/15/2023                HCT                      40.4                06/15/2023                MCV                      85.2                 06/15/2023                PLT                      281                 06/15/2023              Anesthesia Other Findings   Reproductive/Obstetrics                              Anesthesia Physical Anesthesia Plan  ASA: 3  Anesthesia Plan: MAC   Post-op Pain Management:    Induction: Intravenous  PONV Risk Score and Plan: Propofol infusion and Treatment may vary due to age or medical condition  Airway Management Planned: Natural Airway and Nasal Cannula  Additional Equipment: None  Intra-op Plan:   Post-operative Plan:   Informed Consent: I have reviewed the patients History and Physical, chart, labs and discussed the procedure including the risks, benefits and alternatives for the proposed anesthesia with the patient or authorized representative who has indicated his/her understanding and acceptance.     Dental advisory given  Plan Discussed with: CRNA  Anesthesia Plan Comments: (EGD For GI bleeed)         Anesthesia Quick Evaluation

## 2023-06-15 NOTE — Progress Notes (Signed)
   06/15/23 1715  TOC Brief Assessment  Insurance and Status Reviewed  Patient has primary care physician Yes  Home environment has been reviewed From home, with spouse  Prior level of function: Independent  Prior/Current Home Services No current home services  Social Determinants of Health Reivew SDOH reviewed no interventions necessary  Readmission risk has been reviewed Yes  Transition of care needs no transition of care needs at this time

## 2023-06-15 NOTE — Consult Note (Signed)
Gastroenterology Inpatient Consultation   Attending Requesting Consult Marinda Elk, MD  [facility] Day: 2  Reason for Consult CGE/Hematemesis on AntiPLT therapy while nauseated    History of Present Illness  Antonio Serrano is a 77 y.o. male with a pmh significant for prior CVA (on Plavix), hypertension, CRI, hyperlipidemia, chronic hyponatremia, diverticulosis, hemorrhoids.  The GI service is consulted for evaluation and management of coffee-ground emesis/hematemesis on antiplatelet therapy.  The patient presents with 4 to 5 days worth of significant nausea vomiting and abdominal discomfort.  He has had bilious vomiting until the day of his presentation when he had an episode of bright red blood/hematemesis and coffee-ground emesis.  He does not take nonsteroidals on a regular basis.  He has never had anything like this previously although when you look in the chart he ended up coming into the hospital in July and was having nausea and vomiting as well but he did not recall this.  Patient has had a previous endoscopy though it has been 18 years.  He has had mild episodes of dysphagia but nothing like it was back in 2006 before his dilation.  His hemoglobin has been documented while here in the emergency department and in the hospital and has down trended slightly.  BUN not significantly elevated.  He would like to try to know why he had this episode of bleeding if possible.  GI Review of Systems Positive as above Negative for odynophagia, change in bowel habits, melena, hematochezia   Review of Systems  General: Denies fevers/chills/weight loss unintentionally Cardiovascular: Denies chest pain Pulmonary: Denies shortness of breath Gastroenterological: See HPI Genitourinary: Denies darkened urine Hematological: Denies easy bruising/bleeding Dermatological: Denies jaundice Psychological: Mood is stable   Histories  Past Medical History Past Medical History:   Diagnosis Date   Atherosclerosis of coronary artery    On chest CT->calcif in LAD and RCA   Chronic low back pain    MR 11/2021 showed L4-5 foraminal stenosis--> plan per Ortho was to do L4-5 injection   Chronic prostatitis    Very mild elevation of PSA (>4), referred to [facility] where PSA repeat was 1.0 on 06/16/13.  Annual PSA repeat is all that is needed now per urologist.  Most recent 07/2015 was 0.45.   Chronic renal insufficiency, stage III (moderate) (HCC)    Baseline GFR as of 2019= 50s.     Closed right hip fracture (HCC) 03/2021   THA   Diabetes mellitus with complication (HCC) Dx'd fall 2011   HTN (hypertension)    Renal/aortic doppler u/s normal 06/2010   Hypercalcemia    Hyperlipidemia 2016   Statin intolerant--myalgias.  Pt refuses any further trial of statin as of 2018.   Nonspecific abnormal electrocardiogram (ECG) (EKG) Fall 2011   TWI in inferior leads: myocardial perfusion scan neg and echo normal 07/2010   Pseudomonas aeruginosa colonization    12/2021 urine clx   TIA (transient ischemic attack)    12/2021- CT angio head/neck no high grade stenosis.  MR showed old lacunar infarcts corona radiata, basal ganglia, thalamus.  Echo normal.   Tobacco dependence    UTI (lower urinary tract infection)    Feb 2012 (klebsiella--dx at [facility]); 03/2013 e coli.    Past Surgical History:  Procedure Laterality Date   CARDIOVASCULAR STRESS TEST  07/01/2010   Myocardial perfusion scan neg/low risk.   CATARACT EXTRACTION  2007, 2008   Bilateral ([facility] on [address].   COLONOSCOPY  12/20/04;12/2014  2016 no polyps: recall 5 yrs due to FH of colon ca   ESOPHAGOGASTRODUODENOSCOPY  11/29/2004   esoph stricture/dilation   TOTAL HIP ARTHROPLASTY Right 04/23/2021   Procedure: TOTAL HIP ARTHROPLASTY ANTERIOR APPROACH;  Surgeon: Cammy Copa, MD;  Location: [facility];  Service: Orthopedics;  Laterality: Right;  Hana, C-arm, Depuy   TRANSTHORACIC ECHOCARDIOGRAM   07/01/2010   2011 EF =>55%; Antonio mildly dilated; trace MR/TR.  12/2021 EF 60-65%, DD indeterm, valves nl.    Allergies Allergies  Allergen Reactions   Bactrim [Sulfamethoxazole-Trimethoprim] Nausea Only    Family History Family History  Problem Relation Age of Onset   Dementia Mother    Pneumonia Father        died in 47's of pneumonia, was otherwise healthy   Cancer Sister        colon cancer.  Died age 69   Diabetes Paternal Aunt    Diabetes Sister    Colon cancer Neg Hx     Social History Social History   Socioeconomic History   Marital status: Married    Spouse name: Not on file   Number of children: Not on file   Years of education: Not on file   Highest education level: Not on file  Occupational History   Not on file  Tobacco Use   Smoking status: Every Day    Current packs/day: 0.25    Average packs/day: 0.3 packs/day for 50.0 years (12.5 ttl pk-yrs)    Types: Cigarettes   Smokeless tobacco: Never  Vaping Use   Vaping status: Never Used  Substance and Sexual Activity   Alcohol use: Not Currently    Alcohol/week: 0.0 standard drinks of alcohol    Comment: rare alcohol   Drug use: No   Sexual activity: Not on file  Other Topics Concern   Not on file  Social History Narrative   Married, 4 grown children, 6 grandchildren.   Works in Production designer, theatre/television/film at [employer] in [place], [place].  Worked in [employer] all his life.   Lives in [place].   25 pack-yr tobacco hx--current as of 05/2014.   Drinks about a six pack per week.  Enjoys golf--six handicap at one point.  No formal exercise.   Walks a lot for his job.         Social Determinants of Health   Financial Resource Strain: Low Risk  (11/28/2022)   Overall Financial Resource Strain (CARDIA)    Difficulty of Paying Living Expenses: Not hard at all  Food Insecurity: No Food Insecurity (06/14/2023)   Hunger Vital Sign    Worried About Running Out of Food in the Last Year: Never true    Ran Out of Food in  the Last Year: Never true  Transportation Needs: No Transportation Needs (06/14/2023)   PRAPARE - Administrator, Civil Service (Medical): No    Lack of Transportation (Non-Medical): No  Physical Activity: Insufficiently Active (11/28/2022)   Exercise Vital Sign    Days of Exercise per Week: 5 days    Minutes of Exercise per Session: 20 min  Stress: No Stress Concern Present (11/28/2022)   [employer] of Occupational Health - Occupational Stress Questionnaire    Feeling of Stress : Not at all  Social Connections: Moderately Isolated (11/28/2022)   Social Connection and Isolation Panel [NHANES]    Frequency of Communication with Friends and Family: More than three times a week    Frequency of Social Gatherings with Friends and Family: Once  a week    Attends Religious Services: Never    Active Member of Clubs or Organizations: No    Attends Banker Meetings: Never    Marital Status: Married  Catering manager Violence: Not At Risk (06/14/2023)   Humiliation, Afraid, Rape, and Kick questionnaire    Fear of Current or Ex-Partner: No    Emotionally Abused: No    Physically Abused: No    Sexually Abused: No    Medications  Home Medications No current facility-administered medications on file prior to encounter.   Current Outpatient Medications on File Prior to Encounter  Medication Sig Dispense Refill   acetaminophen (TYLENOL) 500 MG tablet Take 1,000 mg by mouth as needed for moderate pain.     amLODipine (NORVASC) 2.5 MG tablet Take 1 tablet (2.5 mg total) by mouth daily. 90 tablet 1   clopidogrel (PLAVIX) 75 MG tablet Take 1 tablet (75 mg total) by mouth daily. 90 tablet 1   ipratropium (ATROVENT) 0.03 % nasal spray USE 2 SPRAY(S) IN EACH NOSTRIL EVERY 12 HOURS (Patient taking differently: Place 1 spray into both nostrils as needed for rhinitis.) 30 mL 11   OVER THE COUNTER MEDICATION Take 1 tablet by mouth daily. Elderberry, zinc, and vitamin C combo  medication OTC.     pantoprazole (PROTONIX) 40 MG tablet Take 1 tablet (40 mg total) by mouth daily. 90 tablet 1   sodium chloride 1 g tablet Take 1 tablet (1 g total) by mouth 2 (two) times daily with a meal. 60 tablet 1   traZODone (DESYREL) 50 MG tablet TAKE 1 TABLET BY MOUTH EVERY DAY AT BEDTIME AS NEEDED FOR SLEEP 90 tablet 1   Docusate Calcium (STOOL SOFTENER PO) Take 1 tablet by mouth daily as needed (constipation). (Patient not taking: Reported on 06/14/2023)     polyethylene glycol (MIRALAX / GLYCOLAX) 17 g packet Take 17 g by mouth daily as needed for mild constipation. (Patient not taking: Reported on 06/14/2023)     Scheduled Inpatient Medications  sodium chloride  1 g Oral BID WC   Continuous Inpatient Infusions  0.9 % NaCl with KCl 40 mEq / L     PRN Inpatient Medications acetaminophen **OR** acetaminophen, ondansetron **OR** ondansetron (ZOFRAN) IV, traZODone   Physical Examination  BP (!) 169/94 (BP Location: Right Arm)   Pulse (!) 58   Temp 97.7 F (36.5 C) (Oral)   Resp 15   Ht 5\' 6"  (1.676 m)   Wt 64 kg   SpO2 99%   BMI 22.77 kg/m  GEN: NAD, appears stated age, doesn't appear chronically ill PSYCH: Cooperative, without pressured speech EYE: Conjunctivae pink, sclerae anicteric ENT: MMM CV: Nontachycardic RESP: No audible wheezing GI: NABS, soft, protuberant abdomen, rounded, nontender, no rebound or guarding MSK/EXT: No significant lower extremity edema SKIN: Bilateral pedal edema is present NEURO:  Alert & Oriented x 3, no focal deficits   Review of Data  I reviewed the following data at the time of this encounter:  Laboratory Studies   Recent Labs  Lab 06/15/23 0446  NA 128*  K 3.2*  CL 95*  CO2 24  BUN 9  CREATININE 1.13  GLUCOSE 93  CALCIUM 8.6*   Recent Labs  Lab 06/14/23 1242  AST 19  ALT 14  ALKPHOS 141*    Recent Labs  Lab 06/14/23 1242 06/15/23 0446  WBC 8.3 6.0  HGB 16.1 14.1  HCT 45.7 40.4  PLT 363 281   No results  for input(s): "  APTT", "INR" in the last 168 hours.  Imaging Studies  July 2024 CT abdomen pelvis without contrast IMPRESSION: 1. No acute localizing process in the abdomen or pelvis. 2. Small hiatal hernia. 3. Stable right renal atrophy. 4. Prostatomegaly.  GI Procedures and Studies  Prior EGD in 2006 With report of a distal esophageal stenosis dilated with Elease Hashimoto with moderate resistance.  No other findings.  Assessment  Antonio Serrano is a 77 y.o. male with a pmh significant for prior CVA (on Plavix), hypertension, CRI, hyperlipidemia, chronic hyponatremia, diverticulosis, hemorrhoids.  The GI service is consulted for evaluation and management of coffee-ground emesis/hematemesis on antiplatelet therapy.  The patient is hemodynamically and clinically stable at this time.  The etiology of his nausea and vomiting and abdominal discomfort is not clearly defined based on his evaluation to peptic ulcer disease is certainly possible.  I do suspect that he probably had some sort of viral/microbial/chemical gastroenteritis however.  With this being said, I do think it is reasonable for an upper endoscopy to be pursued as he also describes some intermittent episodes of dysphagia.  Pending anesthesia not being available for an endoscopy today, he will be scheduled for an endoscopy tomorrow.  He can be on full liquid diet today.  The risks and benefits of endoscopic evaluation were discussed with the patient; these include but are not limited to the risk of perforation, infection, bleeding, missed lesions, lack of diagnosis, severe illness requiring hospitalization, as well as anesthesia and sedation related illnesses.  The patient and/or family is agreeable to proceed.  I think it is important for [place] to do this since he needs to go back on antiplatelet therapy.  His chronic hyponatremia should hopefully not be an issue for [place] in regards to sedation.  All patient questions were answered to the best of my  ability, and the patient agrees to the aforementioned plan of action with follow-up as indicated.   Plan/Recommendations  No further anesthesia time available today for endoscopic procedures Full liquid diet today N.p.o. at midnight (other than for medications) EGD diagnostic with possible therapeutic intent for 9/15 Continue PPI twice daily   Thank you for this consult.  We will continue to follow.  Please page/call with questions or concerns.   Corliss Parish, MD [facility] Office # [phone]

## 2023-06-16 ENCOUNTER — Inpatient Hospital Stay (HOSPITAL_COMMUNITY): Payer: Medicare Other | Admitting: Anesthesiology

## 2023-06-16 ENCOUNTER — Encounter (HOSPITAL_COMMUNITY)
Admission: EM | Disposition: A | Discharge status: Skilled Nursing Facility | Payer: Self-pay | Source: Home / Self Care | Attending: Internal Medicine

## 2023-06-16 ENCOUNTER — Encounter (HOSPITAL_COMMUNITY): Payer: Self-pay | Admitting: Internal Medicine

## 2023-06-16 DIAGNOSIS — K2970 Gastritis, unspecified, without bleeding: Secondary | ICD-10-CM

## 2023-06-16 DIAGNOSIS — F17210 Nicotine dependence, cigarettes, uncomplicated: Secondary | ICD-10-CM

## 2023-06-16 DIAGNOSIS — D62 Acute posthemorrhagic anemia: Secondary | ICD-10-CM | POA: Diagnosis not present

## 2023-06-16 DIAGNOSIS — I129 Hypertensive chronic kidney disease with stage 1 through stage 4 chronic kidney disease, or unspecified chronic kidney disease: Secondary | ICD-10-CM | POA: Diagnosis not present

## 2023-06-16 DIAGNOSIS — I2510 Atherosclerotic heart disease of native coronary artery without angina pectoris: Secondary | ICD-10-CM | POA: Diagnosis not present

## 2023-06-16 DIAGNOSIS — K922 Gastrointestinal hemorrhage, unspecified: Secondary | ICD-10-CM | POA: Diagnosis not present

## 2023-06-16 DIAGNOSIS — E871 Hypo-osmolality and hyponatremia: Secondary | ICD-10-CM | POA: Diagnosis not present

## 2023-06-16 DIAGNOSIS — K222 Esophageal obstruction: Secondary | ICD-10-CM

## 2023-06-16 DIAGNOSIS — R1319 Other dysphagia: Secondary | ICD-10-CM

## 2023-06-16 DIAGNOSIS — K920 Hematemesis: Secondary | ICD-10-CM

## 2023-06-16 DIAGNOSIS — N1831 Chronic kidney disease, stage 3a: Secondary | ICD-10-CM | POA: Diagnosis not present

## 2023-06-16 HISTORY — PX: SAVORY DILATION: SHX5439

## 2023-06-16 HISTORY — PX: BIOPSY: SHX5522

## 2023-06-16 HISTORY — PX: ESOPHAGOGASTRODUODENOSCOPY: SHX5428

## 2023-06-16 HISTORY — DX: Gastritis, unspecified, without bleeding: K29.70

## 2023-06-16 LAB — CBC
HCT: 42.9 % (ref 39.0–52.0)
Hemoglobin: 14.4 g/dL (ref 13.0–17.0)
MCH: 29.4 pg (ref 26.0–34.0)
MCHC: 33.6 g/dL (ref 30.0–36.0)
MCV: 87.6 fL (ref 80.0–100.0)
Platelets: 262 10*3/uL (ref 150–400)
RBC: 4.9 MIL/uL (ref 4.22–5.81)
RDW: 14.5 % (ref 11.5–15.5)
WBC: 6.4 10*3/uL (ref 4.0–10.5)
nRBC: 0 % (ref 0.0–0.2)

## 2023-06-16 LAB — BASIC METABOLIC PANEL
Anion gap: 7 (ref 5–15)
BUN: 7 mg/dL — ABNORMAL LOW (ref 8–23)
CO2: 24 mmol/L (ref 22–32)
Calcium: 8.5 mg/dL — ABNORMAL LOW (ref 8.9–10.3)
Chloride: 99 mmol/L (ref 98–111)
Creatinine, Ser: 0.99 mg/dL (ref 0.61–1.24)
GFR, Estimated: >60 mL/min (ref >60)
Glucose, Bld: 83 mg/dL (ref 70–99)
Potassium: 3.4 mmol/L — ABNORMAL LOW (ref 3.5–5.1)
Sodium: 130 mmol/L — ABNORMAL LOW (ref 135–145)

## 2023-06-16 SURGERY — EGD (ESOPHAGOGASTRODUODENOSCOPY)
Anesthesia: Monitor Anesthesia Care

## 2023-06-16 MED ORDER — GLYCOPYRROLATE 0.2 MG/ML IJ SOLN
INTRAMUSCULAR | Status: DC | PRN
Start: 2023-06-16 — End: 2023-06-16
  Administered 2023-06-16: .2 mg via INTRAVENOUS

## 2023-06-16 MED ORDER — POTASSIUM CHLORIDE IN NACL 40-0.9 MEQ/L-% IV SOLN
INTRAVENOUS | Status: AC
  Filled 2023-06-16: qty 1000

## 2023-06-16 MED ORDER — POTASSIUM CHLORIDE IN NACL 40-0.9 MEQ/L-% IV SOLN
INTRAVENOUS | Status: DC
  Filled 2023-06-16: qty 1000

## 2023-06-16 MED ORDER — PROPOFOL 500 MG/50ML IV EMUL
INTRAVENOUS | Status: DC | PRN
  Administered 2023-06-16: 100 ug/kg/min via INTRAVENOUS

## 2023-06-16 MED ORDER — LACTATED RINGERS IV SOLN
INTRAVENOUS | Status: AC | PRN
  Administered 2023-06-16: 1000 mL via INTRAVENOUS

## 2023-06-16 MED ORDER — PANTOPRAZOLE SODIUM 40 MG PO TBEC
40.0000 mg | DELAYED_RELEASE_TABLET | Freq: Two times a day (BID) | ORAL | Status: DC
  Administered 2023-06-16 – 2023-06-19 (×6): 40 mg via ORAL
  Filled 2023-06-16 (×7): qty 1

## 2023-06-16 MED ORDER — POTASSIUM CHLORIDE 10 MEQ/100ML IV SOLN
10.0000 meq | INTRAVENOUS | Status: AC
  Administered 2023-06-16 (×5): 10 meq via INTRAVENOUS
  Filled 2023-06-16 (×5): qty 100

## 2023-06-16 MED ORDER — LIDOCAINE HCL (CARDIAC) PF 100 MG/5ML IV SOSY
PREFILLED_SYRINGE | INTRAVENOUS | Status: DC | PRN
Start: 2023-06-16 — End: 2023-06-16
  Administered 2023-06-16: 60 mg via INTRATRACHEAL

## 2023-06-16 MED ORDER — LACTATED RINGERS IV SOLN: INTRAVENOUS | Status: DC | PRN

## 2023-06-16 MED ORDER — SUCRALFATE 1 GM/10ML PO SUSP
1.0000 g | Freq: Three times a day (TID) | ORAL | Status: DC
  Administered 2023-06-16 – 2023-06-19 (×13): 1 g via ORAL
  Filled 2023-06-16 (×14): qty 10

## 2023-06-16 MED ORDER — PHENYLEPHRINE HCL (PRESSORS) 10 MG/ML IV SOLN
INTRAVENOUS | Status: DC | PRN
Start: 2023-06-16 — End: 2023-06-16
  Administered 2023-06-16 (×2): 80 ug via INTRAVENOUS

## 2023-06-16 NOTE — Anesthesia Postprocedure Evaluation (Signed)
Anesthesia Post Note  Patient: Antonio Serrano  Procedure(s) Performed: ESOPHAGOGASTRODUODENOSCOPY (EGD) SAVORY DILATION BIOPSY     Patient location during evaluation: PACU Anesthesia Type: MAC Level of consciousness: awake and alert Pain management: pain level controlled Vital Signs Assessment: post-procedure vital signs reviewed and stable Respiratory status: spontaneous breathing, nonlabored ventilation, respiratory function stable and patient connected to nasal cannula oxygen Cardiovascular status: stable and blood pressure returned to baseline Postop Assessment: no apparent nausea or vomiting Anesthetic complications: no   No notable events documented.  Last Vitals:  Vitals:   06/16/23 0957 06/16/23 1000  BP: 122/78 117/72  Pulse: 69 67  Resp: 16 10  Temp: (!) 36.2 C   SpO2: 98% 99%    Last Pain:  Vitals:   06/16/23 0957  TempSrc:   PainSc: 0-No pain                 Trevor Iha

## 2023-06-16 NOTE — Op Note (Signed)
[facility] Patient Name: Antonio Serrano Procedure Date: 06/16/2023 MRN: [id number] Attending MD: Corliss Parish , MD, [id number] Date of Birth: 19-Feb-1946 CSN: [id number] Age: 77 Admit Type: Inpatient Procedure:                Upper GI endoscopy Indications:              Dyspepsia, Dysphagia, Coffee-ground emesis,                            Hematemesis, Recent gastrointestinal bleeding,                            Nausea with vomiting Providers:                Corliss Parish, MD, Eliberto Ivory, RN, Salley Scarlet, Technician, Marye Round, CRNA Referring MD:             Inpatient Medical service Medicines:                Monitored Anesthesia Care Complications:            No immediate complications. Estimated Blood Loss:     Estimated blood loss was minimal. Procedure:                Pre-Anesthesia Assessment:                           - Prior to the procedure, a History and Physical                            was performed, and patient medications and                            allergies were reviewed. The patient's tolerance of                            previous anesthesia was also reviewed. The risks                            and benefits of the procedure and the sedation                            options and risks were discussed with the patient.                            All questions were answered, and informed consent                            was obtained. Prior Anticoagulants: The patient has                            taken Plavix (clopidogrel), last dose was 3 days  prior to procedure. ASA Grade Assessment: III - A                            patient with severe systemic disease. After                            reviewing the risks and benefits, the patient was                            deemed in satisfactory condition to undergo the                            procedure.                            After obtaining informed consent, the endoscope was                            passed under direct vision. Throughout the                            procedure, the patient's blood pressure, pulse, and                            oxygen saturations were monitored continuously. The                            [id number]-H190 ([id number] Olympus endoscope was introduced                            through the mouth, and advanced to the second part                            of duodenum. The upper GI endoscopy was                            accomplished without difficulty. The patient                            tolerated the procedure. Scope In: Scope Out: Findings:      The examined esophagus was significantly tortuous.      One benign-appearing, intrinsic moderate (circumferential scarring or       stenosis; an endoscope may pass) stenosis was found 18 to 19 cm from the       incisors. This stenosis measured 1 cm (inner diameter) x less than one       cm (in length). The stenosis was traversed.      No other gross mucosal lesions were noted in the entire esophagus.       Biopsies were taken with a cold forceps for histology to rule out       EOE/LOE.      A low-grade of narrowing Schatzki ring was found at the gastroesophageal       junction. Disruption biopsies were taken with a cold forceps for       histology.  After the rest of the EGD was completed, a guidewire was placed and the       scope was withdrawn. Dilation was performed in the esophagus with a       Savary dilator with moderate resistance at 12 mm. The dilation site was       examined following endoscope reinsertion and showed mild mucosal       disruption, mild improvement in luminal narrowing and no perforation in       the region below the UES at the previously noted narrowing.      A 3 cm hiatal hernia was present.      Segmental moderate inflammation characterized by congestion (edema),       erosions, erythema and granularity  was found in the cardia, in the       gastric fundus and in the gastric body.      No other gross lesions were noted in the entire examined stomach.       Biopsies were taken with a cold forceps for histology and Helicobacter       pylori testing.      No gross lesions were noted in the duodenal bulb, in the first portion       of the duodenum and in the second portion of the duodenum. Impression:               - Tortuous esophagus.                           - Benign-appearing esophageal stenosis noted just                            below the UES.                           - No gross lesions in the entire esophagus.                            Biopsied.                           - Low-grade of narrowing Schatzki ring. Disrupted.                           - Savory dilation performed in the esophagus up to                            12 mm with mucosal wrent noted just below the UES                            and area of previously noted stenosis (only 3 days                            off Plavix or did not dilate further).                           - 3 cm hiatal hernia.                           -  Gastritis noted proximally. No other gross                            lesions in the entire stomach. Biopsied.                           - No gross lesions in the duodenal bulb, in the                            first portion of the duodenum and in the second                            portion of the duodenum. Moderate Sedation:      Not Applicable - Patient had care per Anesthesia. Recommendation:           - The patient will be observed post-procedure,                            until all discharge criteria are met.                           - Return patient to hospital ward for ongoing care.                           - Advance diet as tolerated full liquids and then                            soft for next 72 hours.                           - PPI 40 mg BID.                           -  Antiemetics PRN.                           - Carafate 1 g liquid QAC + QHS x 1 month then BID                            dosing and can transition that to tablet form                            thereafter.                           - May restart anti-PLT therapy in 48 hours to                            decrease risk of post-interventional bleeding.                           - Please use Cepacol or Halls Lozenges +/-  Chloraseptic spray for next 72-96 hours to aid in                            sore thoat should you experience this.                           - Await pathology results.                           - Observe patient's clinical course.                           - If patient desires followup in clinic, we can                            arrange. Further dilation could be considered but                            would need to be off antiplatelet therapy for full                            5 days. He is overdue for colonoscopy as well.                           - The findings and recommendations were discussed                            with the patient.                           - The findings and recommendations were discussed                            with the referring physician. Procedure Code(s):        --- Professional ---                           [phone], Esophagogastroduodenoscopy, flexible,                            transoral; with insertion of guide wire followed by                            passage of dilator(s) through esophagus over guide                            wire                           43239, 59, Esophagogastroduodenoscopy, flexible,                            transoral; with biopsy, single or multiple Diagnosis Code(s):        --- Professional ---  Q39.9, Congenital malformation of esophagus,                            unspecified                           K22.2, Esophageal obstruction                            K44.9, Diaphragmatic hernia without obstruction or                            gangrene                           K29.70, Gastritis, unspecified, without bleeding                           R10.13, Epigastric pain                           R13.10, Dysphagia, unspecified                           K92.0, Hematemesis                           K92.2, Gastrointestinal hemorrhage, unspecified                           R11.2, Nausea with vomiting, unspecified CPT copyright 2022 American Medical Association. All rights reserved. The codes documented in this report are preliminary and upon coder review may  be revised to meet current compliance requirements. Corliss Parish, MD 06/16/2023 9:53:36 AM Number of Addenda: 0

## 2023-06-16 NOTE — Transfer of Care (Signed)
Immediate Anesthesia Transfer of Care Note  Patient: Antonio Serrano  Procedure(s) Performed: ESOPHAGOGASTRODUODENOSCOPY (EGD) SAVORY DILATION BIOPSY  Patient Location: PACU  Anesthesia Type:MAC  Level of Consciousness: awake and alert   Airway & Oxygen Therapy: Patient Spontanous Breathing and Patient connected to nasal cannula oxygen  Post-op Assessment: Report given to RN and Post -op Vital signs reviewed and stable  Post vital signs: Reviewed and stable  Last Vitals:  Vitals Value Taken Time  BP 122/78 06/16/23 0956  Temp    Pulse 68 06/16/23 0958  Resp 13 06/16/23 0958  SpO2 100 % 06/16/23 0958  Vitals shown include unfiled device data.  Last Pain:  Vitals:   06/16/23 0902  TempSrc: Temporal  PainSc: 0-No pain         Complications: No notable events documented.

## 2023-06-16 NOTE — Interval H&P Note (Signed)
History and Physical Interval Note:  06/16/2023 8:45 AM  Antonio Serrano  has presented today for surgery, with the diagnosis of CGE on antiPLT therapy.  The various methods of treatment have been discussed with the patient and family. After consideration of risks, benefits and other options for treatment, the patient has consented to  Procedure(s): ESOPHAGOGASTRODUODENOSCOPY (EGD) (N/A) as a surgical intervention.  The patient's history has been reviewed, patient examined, no change in status, stable for surgery.  I have reviewed the patient's chart and labs.  Questions were answered to the patient's satisfaction.     [employer]

## 2023-06-16 NOTE — Progress Notes (Signed)
[facility] PROGRESS NOTE    Progress Note  Antonio Serrano  WUJ:[id number] DOB: 24-Jan-1946 DOA: 06/14/2023 PCP: Jeoffrey Massed, MD     Brief Narrative:   Antonio Serrano is an 76 y.o. male past medical history significant for chronic hyponatremia, chronic kidney stage III, essential hypertension, prior history of CVA on Plavix admitted with an upper GI bleed was having nausea and vomiting for at least 4 days prior to admission 1 day prior to admission started having hematemesis, and eventually coffee-ground.  He does relate he had about a fall about 10 to 7 days prior to admission where he hit his chest, chest x-ray showed a nondisplaced fracture and old here ninth rib fracture   Assessment/Plan:   Upper GI bleed: Plavix has been held.  He denies any NSAID use. GI was consulted who recommended an EGD on 06/16/2023. Continue IV Protonix, hemoglobin has remained stable. Denies any recent diarrhea or gastroenteritis.  Hypovolemic hyponatremia: Appears to be chronic currently asymptomatic on salt tablets at home.   Sodium this morning is 134 potassium is low replete IV recheck in the morning.  Hypokalemia: Replete IV recheck in the morning.  Chronic kidney see stage II: With a baseline creatinine of around 1 on admission 1.2 continue to monitor closely.  Type 2 diabetes mellitus with stage 3a chronic kidney disease, without long-term current use of insulin  With a last A1c of 6.5 about a year ago. Continue to hold oral hypoglycemics continue CBGs check.   DVT prophylaxis: scd Family Communication:none Status is: Inpatient Remains inpatient appropriate because: Acute upper GI bleed    Code Status:     Code Status Orders  (From admission, onward)           Start     Ordered   06/14/23 1642  Full code  Continuous       Question:  By:  Answer:  Consent: discussion documented in EHR   06/14/23 1642           Code Status History     Date Active Date  Inactive Code Status Order ID Comments User Context   04/19/2023 1611 04/24/2023 0045 Full Code [id number]  Nolberto Hanlon, MD ED   01/04/2022 1529 01/05/2022 2003 Full Code [id number]  Emeline General, MD ED   04/23/2021 0118 04/25/2021 1649 Full Code [id number]  Marlow Baars, MD ED      Advance Directive Documentation    Flowsheet Row Most Recent Value  Type of Advance Directive Living will  Pre-existing out of facility DNR order (yellow form or pink MOST form) --  "MOST" Form in Place? --         IV Access:   Peripheral IV   Procedures and diagnostic studies:   DG Ribs Unilateral W/Chest Right  Result Date: 06/14/2023 CLINICAL DATA:  Mechanical fall 1 week ago with persistent right shoulder and rib pain. EXAM: RIGHT RIBS AND CHEST - 3+ VIEW COMPARISON:  Right shoulder radiographs-earlier same day; chest radiograph-02/05/2022 FINDINGS: Old/healed right ninth rib fracture, similar to chest radiograph performed 02/05/2022. No acute displaced fractures. Regional soft tissues appear normal. No radiopaque foreign body. Limited visualization of the adjacent thorax is normal given obliquity and technique. Stigmata of dish within the thoracic spine. IMPRESSION: 1. No acute displaced fractures. 2. Old/healed right ninth rib fracture. Electronically Signed   By: Simonne Come M.D.   On: 06/14/2023 15:23   DG Shoulder Right  Result Date: 06/14/2023 CLINICAL DATA:  Mechanical fall 1 week ago  with persistent right shoulder pain. EXAM: RIGHT SHOULDER - 2+ VIEW COMPARISON:  Right rib radiographic series-earlier same day FINDINGS: No fracture or dislocation. Glenohumeral joint spaces appear preserved. Moderate degenerative change of the right AC joint with joint space loss, subchondral sclerosis and inferiorly directed osteophytosis. No evidence of calcific tendinitis. Old/healed right-sided rib fracture. Limited visualization of the adjacent thorax is otherwise normal. Regional soft tissues appear normal.  IMPRESSION: 1. No acute findings. 2. Moderate degenerative change of the right AC joint. Electronically Signed   By: Simonne Come M.D.   On: 06/14/2023 15:21     Medical Consultants:   None.   Subjective:    Antonio Serrano no complaints this morning, he relates he is ready to get this over with..  Objective:    Vitals:   06/15/23 0942 06/15/23 1339 06/15/23 2229 06/16/23 0511  BP: (!) 166/91 (!) 161/80 (!) 161/84 (!) 158/89  Pulse: (!) 54 67 65 62  Resp: 20 20 17 18   Temp: 98 F (36.7 C) 98.6 F (37 C) 97.8 F (36.6 C) (!) 97.5 F (36.4 C)  TempSrc: Oral Oral Oral Oral  SpO2: 96% 96% 99% 96%  Weight:      Height:       SpO2: 96 %   Intake/Output Summary (Last 24 hours) at 06/16/2023 0728 Last data filed at 06/16/2023 0512 Gross per 24 hour  Intake 805.72 ml  Output 1730 ml  Net -924.28 ml   Filed Weights   06/14/23 2009  Weight: 64 kg    Exam: General exam: In no acute distress. Respiratory system: Good air movement and clear to auscultation. Cardiovascular system: S1 & S2 heard, RRR. No JVD. Gastrointestinal system: Abdomen is nondistended, soft and nontender.  Extremities: No pedal edema. Skin: No rashes, lesions or ulcers Psychiatry: Judgement and insight appear normal. Mood & affect appropriate.  Data Reviewed:    Labs: Basic Metabolic Panel: Recent Labs  Lab 06/14/23 1242 06/15/23 0446 06/16/23 0349  NA 124* 128* 130*  K 3.8 3.2* 3.4*  CL 90* 95* 99  CO2 22 24 24   GLUCOSE 173* 93 83  BUN 8 9 7*  CREATININE 1.25* 1.13 0.99  CALCIUM 9.2 8.6* 8.5*   GFR Estimated Creatinine Clearance: 58.2 mL/min (by C-G formula based on SCr of 0.99 mg/dL). Liver Function Tests: Recent Labs  Lab 06/14/23 1242  AST 19  ALT 14  ALKPHOS 141*  BILITOT 1.2  PROT 7.6  ALBUMIN 4.2   Recent Labs  Lab 06/14/23 1242  LIPASE 48   No results for input(s): "AMMONIA" in the last 168 hours. Coagulation profile Recent Labs  Lab 06/15/23 0714  INR 1.1    COVID-19 Labs  No results for input(s): "DDIMER", "FERRITIN", "LDH", "CRP" in the last 72 hours.  Lab Results  Component Value Date   SARSCOV2NAA NEGATIVE 01/04/2022   SARSCOV2NAA NEGATIVE 04/23/2021   SARSCOV2NAA NEGATIVE 02/04/2021    CBC: Recent Labs  Lab 06/14/23 1242 06/15/23 0446 06/16/23 0349  WBC 8.3 6.0 6.4  HGB 16.1 14.1 14.4  HCT 45.7 40.4 42.9  MCV 83.7 85.2 87.6  PLT 363 281 262   Cardiac Enzymes: No results for input(s): "CKTOTAL", "CKMB", "CKMBINDEX", "TROPONINI" in the last 168 hours. BNP (last 3 results) No results for input(s): "PROBNP" in the last 8760 hours. CBG: No results for input(s): "GLUCAP" in the last 168 hours. D-Dimer: No results for input(s): "DDIMER" in the last 72 hours. Hgb A1c: No results for input(s): "HGBA1C" in the last 72  hours. Lipid Profile: No results for input(s): "CHOL", "HDL", "LDLCALC", "TRIG", "CHOLHDL", "LDLDIRECT" in the last 72 hours. Thyroid function studies: No results for input(s): "TSH", "T4TOTAL", "T3FREE", "THYROIDAB" in the last 72 hours.  Invalid input(s): "FREET3" Anemia work up: No results for input(s): "VITAMINB12", "FOLATE", "FERRITIN", "TIBC", "IRON", "RETICCTPCT" in the last 72 hours. Sepsis Labs: Recent Labs  Lab 06/14/23 1242 06/15/23 0446 06/16/23 0349  WBC 8.3 6.0 6.4   Microbiology No results found for this or any previous visit (from the past 240 hour(s)).   Medications:    amLODipine  2.5 mg Oral Daily   pantoprazole  40 mg Oral Daily   sodium chloride  1 g Oral BID WC   Continuous Infusions:  0.9 % NaCl with KCl 40 mEq / L 50 mL/hr at 06/15/23 1529      LOS: 2 days   Marinda Elk  [facility]  06/16/2023, 7:28 AM

## 2023-06-16 NOTE — Progress Notes (Signed)
Vitals taken at 3:56pm, 06/16/2023 by Haroldine Laws Student Nurses Dhwa Haywood Pao and Abbe Amsterdam

## 2023-06-17 ENCOUNTER — Encounter (HOSPITAL_COMMUNITY): Payer: Self-pay | Admitting: Gastroenterology

## 2023-06-17 DIAGNOSIS — E871 Hypo-osmolality and hyponatremia: Secondary | ICD-10-CM | POA: Diagnosis not present

## 2023-06-17 DIAGNOSIS — K922 Gastrointestinal hemorrhage, unspecified: Secondary | ICD-10-CM | POA: Diagnosis not present

## 2023-06-17 DIAGNOSIS — N1831 Chronic kidney disease, stage 3a: Secondary | ICD-10-CM | POA: Diagnosis not present

## 2023-06-17 DIAGNOSIS — D62 Acute posthemorrhagic anemia: Secondary | ICD-10-CM | POA: Diagnosis not present

## 2023-06-17 LAB — BASIC METABOLIC PANEL
Anion gap: 9 (ref 5–15)
BUN: 7 mg/dL — ABNORMAL LOW (ref 8–23)
CO2: 19 mmol/L — ABNORMAL LOW (ref 22–32)
Calcium: 8.7 mg/dL — ABNORMAL LOW (ref 8.9–10.3)
Chloride: 98 mmol/L (ref 98–111)
Creatinine, Ser: 1.02 mg/dL (ref 0.61–1.24)
GFR, Estimated: >60 mL/min (ref >60)
Glucose, Bld: 69 mg/dL — ABNORMAL LOW (ref 70–99)
Potassium: 3.8 mmol/L (ref 3.5–5.1)
Sodium: 126 mmol/L — ABNORMAL LOW (ref 135–145)

## 2023-06-17 MED ORDER — SODIUM CHLORIDE 0.9 % IV SOLN: INTRAVENOUS | Status: AC

## 2023-06-17 MED ORDER — SODIUM CHLORIDE 0.9 % IV SOLN: INTRAVENOUS | Status: DC

## 2023-06-17 NOTE — Progress Notes (Signed)
[facility] PROGRESS NOTE    Progress Note  Antonio Serrano  VWU:[id number] DOB: [birth year] DOA: 06/14/2023 PCP: Jeoffrey Massed, MD     Brief Narrative:   Antonio Serrano is an 77 y.o. male past medical history significant for chronic hyponatremia, chronic kidney stage III, essential hypertension, prior history of CVA on Plavix admitted with an upper GI bleed was having nausea and vomiting for at least 4 days prior to admission 1 day prior to admission started having hematemesis, and eventually coffee-ground.  He does relate he had about a fall about 10 to 7 days prior to admission where he hit his chest, chest x-ray showed a nondisplaced fracture and old here ninth rib fracture   Assessment/Plan:   Upper GI bleed: Plavix has been held.  He denies any NSAID use. GI was consulted who recommended an EGD on 06/16/2023 that showed normal-appearing esophagus low-grade narrowing Schatzki ring hiatal hernia a proximal gastritis biopsy taken. GI recommended to continue Protonix Hemoglobin has been stable. Resume Plavix in 48 hours. PT OT evaluation is pending.  Hypovolemic hyponatremia: Appears to be chronic currently asymptomatic on salt tablets at home.   Sodium this morning is 126 start him on IV fluids recheck in the morning.  Hypokalemia: Resolved  Chronic kidney see stage II: With a baseline creatinine of around 1 on admission 1.2 continue to monitor closely.  Type 2 diabetes mellitus with stage 3a chronic kidney disease, without long-term current use of insulin  With a last A1c of 6.5 about a year ago. Blood glucose is well-controlled on no oral hypoglycemic agents.   DVT prophylaxis: scd Family Communication:none Status is: Inpatient Remains inpatient appropriate because: Acute upper GI bleed    Code Status:     Code Status Orders  (From admission, onward)           Start     Ordered   06/14/23 1642  Full code  Continuous       Question:  By:  Answer:   Consent: discussion documented in EHR   06/14/23 1642           Code Status History     Date Active Date Inactive Code Status Order ID Comments User Context   04/19/2023 1611 04/24/2023 0045 Full Code [id number]  Nolberto Hanlon, MD ED   01/04/2022 1529 01/05/2022 2003 Full Code [id number]  Emeline General, MD ED   04/23/2021 0118 04/25/2021 1649 Full Code [id number]  Marlow Baars, MD ED      Advance Directive Documentation    Flowsheet Row Most Recent Value  Type of Advance Directive Living will  Pre-existing out of facility DNR order (yellow form or pink MOST form) --  "MOST" Form in Place? --         IV Access:   Peripheral IV   Procedures and diagnostic studies:   No results found.   Medical Consultants:   None.   Subjective:    Antonio Serrano no complaints he was gone wants to go home.  Objective:    Vitals:   06/16/23 1220 06/16/23 1500 06/16/23 2038 06/17/23 0509  BP: 133/67 (!) 155/81 (!) 160/81 130/86  Pulse:  68 60 71  Resp:  14 17 15   Temp:  97.6 F (36.4 C) 97.6 F (36.4 C) 97.8 F (36.6 C)  TempSrc:  Oral Oral Oral  SpO2:  100% 100% 97%  Weight:      Height:       SpO2: 97 % O2 Flow Rate (L/min):  6 L/min   Intake/Output Summary (Last 24 hours) at 06/17/2023 0854 Last data filed at 06/17/2023 0130 Gross per 24 hour  Intake 919.38 ml  Output 1285 ml  Net -365.62 ml   Filed Weights   06/14/23 2009 06/16/23 0902  Weight: 64 kg 64 kg    Exam: General exam: In no acute distress. Respiratory system: Good air movement and clear to auscultation. Cardiovascular system: S1 & S2 heard, RRR. No JVD. Gastrointestinal system: Abdomen is nondistended, soft and nontender.  Extremities: No pedal edema. Skin: No rashes, lesions or ulcers Psychiatry: Judgement and insight appear normal. Mood & affect appropriate. Data Reviewed:    Labs: Basic Metabolic Panel: Recent Labs  Lab 06/14/23 1242 06/15/23 0446 06/16/23 0349 06/17/23 0413  NA 124*  128* 130* 126*  K 3.8 3.2* 3.4* 3.8  CL 90* 95* 99 98  CO2 22 24 24  19*  GLUCOSE 173* 93 83 69*  BUN 8 9 7* 7*  CREATININE 1.25* 1.13 0.99 1.02  CALCIUM 9.2 8.6* 8.5* 8.7*   GFR Estimated Creatinine Clearance: 56.5 mL/min (by C-G formula based on SCr of 1.02 mg/dL). Liver Function Tests: Recent Labs  Lab 06/14/23 1242  AST 19  ALT 14  ALKPHOS 141*  BILITOT 1.2  PROT 7.6  ALBUMIN 4.2   Recent Labs  Lab 06/14/23 1242  LIPASE 48   No results for input(s): "AMMONIA" in the last 168 hours. Coagulation profile Recent Labs  Lab 06/15/23 0714  INR 1.1   COVID-19 Labs  No results for input(s): "DDIMER", "FERRITIN", "LDH", "CRP" in the last 72 hours.  Lab Results  Component Value Date   SARSCOV2NAA NEGATIVE 01/04/2022   SARSCOV2NAA NEGATIVE 04/23/2021   SARSCOV2NAA NEGATIVE 02/04/2021    CBC: Recent Labs  Lab 06/14/23 1242 06/15/23 0446 06/16/23 0349  WBC 8.3 6.0 6.4  HGB 16.1 14.1 14.4  HCT 45.7 40.4 42.9  MCV 83.7 85.2 87.6  PLT 363 281 262   Cardiac Enzymes: No results for input(s): "CKTOTAL", "CKMB", "CKMBINDEX", "TROPONINI" in the last 168 hours. BNP (last 3 results) No results for input(s): "PROBNP" in the last 8760 hours. CBG: No results for input(s): "GLUCAP" in the last 168 hours. D-Dimer: No results for input(s): "DDIMER" in the last 72 hours. Hgb A1c: No results for input(s): "HGBA1C" in the last 72 hours. Lipid Profile: No results for input(s): "CHOL", "HDL", "LDLCALC", "TRIG", "CHOLHDL", "LDLDIRECT" in the last 72 hours. Thyroid function studies: No results for input(s): "TSH", "T4TOTAL", "T3FREE", "THYROIDAB" in the last 72 hours.  Invalid input(s): "FREET3" Anemia work up: No results for input(s): "VITAMINB12", "FOLATE", "FERRITIN", "TIBC", "IRON", "RETICCTPCT" in the last 72 hours. Sepsis Labs: Recent Labs  Lab 06/14/23 1242 06/15/23 0446 06/16/23 0349  WBC 8.3 6.0 6.4   Microbiology No results found for this or any previous  visit (from the past 240 hour(s)).   Medications:    amLODipine  2.5 mg Oral Daily   pantoprazole  40 mg Oral BID AC   sodium chloride  1 g Oral BID WC   sucralfate  1 g Oral TID WC & HS   Continuous Infusions:  sodium chloride        LOS: 3 days   Marinda Elk  [facility]  06/17/2023, 8:54 AM

## 2023-06-17 NOTE — Progress Notes (Signed)
Mobility Specialist - Progress Note   06/17/23 0954  Mobility  Activity Transferred from bed to chair  Level of Assistance Minimal assist, patient does 75% or more  Assistive Device Front wheel walker  Distance Ambulated (ft) 2 ft  Range of Motion/Exercises Active Assistive  Activity Response Tolerated fair  Mobility Referral Yes  $Mobility charge 1 Mobility  Mobility Specialist Start Time (ACUTE ONLY) 0940  Mobility Specialist Stop Time (ACUTE ONLY) 0954  Mobility Specialist Time Calculation (min) (ACUTE ONLY) 14 min   Pt was found in bed and agreeable to ambulate. Pt was min-A for bed mobility going from supine>sit and sit>stand. Stated feeling lightheaded sitting EOB and BP taken 126/76 mmHg (91 MAP) and pulse of 86. Pt attempted ambulation but legs were stiffened and pt stated wanting to sit down. Pt was left on recliner chair at EOS with all needs met. Call bell in reach and NT notified.  Billey Chang Mobility Specialist

## 2023-06-17 NOTE — TOC Progression Note (Addendum)
Transition of Care [facility]) - Progression Note    Patient Details  Name: Antonio Serrano MRN: [id number] Date of Birth: 05-Feb-1946  [facility] CM/SW Contact  Howell Rucks, RN Phone Number: 06/17/2023, 11:40 AM  Clinical Narrative:   PT eval, await recommendation. Text received from [place] with [facility], confirmed pt active with [facility] PT.       Barriers to Discharge: Continued Medical Work up  Expected Discharge Plan and Services                                               Social Determinants of Health (SDOH) Interventions SDOH Screenings   Food Insecurity: No Food Insecurity (06/14/2023)  Housing: Low Risk  (06/14/2023)  Transportation Needs: No Transportation Needs (06/14/2023)  Utilities: Not At Risk (06/14/2023)  Alcohol Screen: Low Risk  (11/16/2020)  Depression (PHQ2-9): High Risk (06/10/2023)  Financial Resource Strain: Low Risk  (11/28/2022)  Physical Activity: Insufficiently Active (11/28/2022)  Social Connections: Moderately Isolated (11/28/2022)  Stress: No Stress Concern Present (11/28/2022)  Tobacco Use: High Risk (06/16/2023)    Readmission Risk Interventions    04/21/2023    3:12 PM  Readmission Risk Prevention Plan  Transportation Screening Complete  PCP or Specialist Appt within 5-7 Days Complete  Home Care Screening Complete  Medication Review (RN CM) Complete

## 2023-06-18 ENCOUNTER — Encounter: Payer: Self-pay | Admitting: Gastroenterology

## 2023-06-18 DIAGNOSIS — N1831 Chronic kidney disease, stage 3a: Secondary | ICD-10-CM | POA: Diagnosis not present

## 2023-06-18 DIAGNOSIS — D62 Acute posthemorrhagic anemia: Secondary | ICD-10-CM | POA: Diagnosis not present

## 2023-06-18 DIAGNOSIS — K922 Gastrointestinal hemorrhage, unspecified: Secondary | ICD-10-CM | POA: Diagnosis not present

## 2023-06-18 DIAGNOSIS — E871 Hypo-osmolality and hyponatremia: Secondary | ICD-10-CM | POA: Diagnosis not present

## 2023-06-18 LAB — SURGICAL PATHOLOGY

## 2023-06-18 LAB — BASIC METABOLIC PANEL
Anion gap: 9 (ref 5–15)
BUN: 7 mg/dL — ABNORMAL LOW (ref 8–23)
CO2: 22 mmol/L (ref 22–32)
Calcium: 8.7 mg/dL — ABNORMAL LOW (ref 8.9–10.3)
Chloride: 97 mmol/L — ABNORMAL LOW (ref 98–111)
Creatinine, Ser: 0.9 mg/dL (ref 0.61–1.24)
GFR, Estimated: >60 mL/min (ref >60)
Glucose, Bld: 87 mg/dL (ref 70–99)
Potassium: 3.6 mmol/L (ref 3.5–5.1)
Sodium: 128 mmol/L — ABNORMAL LOW (ref 135–145)

## 2023-06-18 MED ORDER — SODIUM CHLORIDE 0.9 % IV SOLN: INTRAVENOUS | Status: AC

## 2023-06-18 NOTE — Evaluation (Addendum)
Physical Therapy Evaluation Patient Details Name: Antonio Serrano MRN: [id number] DOB: 03-24-1946 Today's Date: 06/18/2023  History of Present Illness  77 yo male admitted with UGIB, fall at home. Hx of CKD, CVA, DM, R THA, falls, old 9th rib fx  Clinical Impression  On eval, pt required Min A for mobility. He walked ~50 feet with a RW. Pt presents with general weakness, and impaired gait and balance. He tolerated activity fairly well. He exhibits some risk for falls when mobilizing. Briefly discussed d/c plan and PT recommendation. Pt reports he prefers to d/c home and resume HHPT. Will plan to follow and progress activity if pt does not d/c home on today.         If plan is discharge home, recommend the following: A little help with walking and/or transfers;A little help with bathing/dressing/bathroom;Assistance with cooking/housework;Assist for transportation;Help with stairs or ramp for entrance   Can travel by private vehicle        Equipment Recommendations None recommended by PT  Recommendations for Other Services       Functional Status Assessment Patient has had a recent decline in their functional status and demonstrates the ability to make significant improvements in function in a reasonable and predictable amount of time.     Precautions / Restrictions Precautions Precautions: Fall Restrictions Weight Bearing Restrictions: No      Mobility  Bed Mobility Overal bed mobility: Needs Assistance Bed Mobility: Supine to Sit     Supine to sit: Supervision, HOB elevated     General bed mobility comments: Supv for safety. Pt relied on bedrails. Task was effortful for pt. Increased time.    Transfers Overall transfer level: Needs assistance Equipment used: Rolling walker (2 wheels) Transfers: Sit to/from Stand Sit to Stand: Min assist, From elevated surface           General transfer comment: Pt unable to rise from bed unassisted. Assist to power up into  standing, stabilize once standing, and to control descent to bed. Cues for safety, technique, hand/feet placement. Pt reported dizziness initially that resolved enough to progress activity.    Ambulation/Gait Ambulation/Gait assistance: Min assist Gait Distance (Feet): 50 Feet Assistive device: Rolling walker (2 wheels) Gait Pattern/deviations: Step-to pattern       General Gait Details: Cues for safety. Min A to steady for ~15 feet. Pt then progress to CGA. Tolerated distance fairly well.  Stairs            Wheelchair Mobility     Tilt Bed    Modified Rankin (Stroke Patients Only)       Balance Overall balance assessment: Needs assistance, History of Falls         Standing balance support: Bilateral upper extremity supported, Reliant on assistive device for balance, During functional activity Standing balance-Leahy Scale: Poor                               Pertinent Vitals/Pain Pain Assessment Pain Assessment: Faces Faces Pain Scale: Hurts a little bit Pain Location: R lower leg Pain Descriptors / Indicators: Aching Pain Intervention(s): Monitored during session    Home Living Family/patient expects to be discharged to:: Private residence Living Arrangements: Spouse/significant other Available Help at Discharge: Family Type of Home: House Home Access: Stairs to enter Entrance Stairs-Rails: Lawyer of Steps: 5   Home Layout: One level Home Equipment: Agricultural consultant (2 wheels);Rollator (4 wheels)  Prior Function Prior Level of Function : Independent/Modified Independent             Mobility Comments: uses rollator ADLs Comments: reports independence     Extremity/Trunk Assessment   Upper Extremity Assessment Upper Extremity Assessment: Generalized weakness    Lower Extremity Assessment Lower Extremity Assessment: Generalized weakness    Cervical / Trunk Assessment Cervical / Trunk Assessment:  Kyphotic  Communication   Communication Communication: No apparent difficulties  Cognition Arousal: Alert Behavior During Therapy: WFL for tasks assessed/performed Overall Cognitive Status: Within Functional Limits for tasks assessed                                          General Comments      Exercises     Assessment/Plan    PT Assessment Patient needs continued PT services  PT Problem List Decreased strength;Decreased activity tolerance;Decreased balance;Decreased mobility;Decreased knowledge of use of DME       PT Treatment Interventions DME instruction;Gait training;Functional mobility training;Therapeutic activities;Therapeutic exercise;Patient/family education;Balance training    PT Goals (Current goals can be found in the Care Plan section)  Acute Rehab PT Goals Patient Stated Goal: home PT Goal Formulation: With patient Time For Goal Achievement: 07/02/23 Potential to Achieve Goals: Good    Frequency Min 1X/week     Co-evaluation               AM-PAC PT "6 Clicks" Mobility  Outcome Measure Help needed turning from your back to your side while in a flat bed without using bedrails?: None Help needed moving from lying on your back to sitting on the side of a flat bed without using bedrails?: None Help needed moving to and from a bed to a chair (including a wheelchair)?: A Little Help needed standing up from a chair using your arms (e.g., wheelchair or bedside chair)?: A Little Help needed to walk in hospital room?: A Little Help needed climbing 3-5 steps with a railing? : A Little 6 Click Score: 20    End of Session Equipment Utilized During Treatment: Gait belt Activity Tolerance: Patient tolerated treatment well Patient left: in bed;with call bell/phone within reach;with bed alarm set   PT Visit Diagnosis: History of falling (Z91.81);Unsteadiness on feet (R26.81);Muscle weakness (generalized) (M62.81)    Time: 6045-4098 PT Time  Calculation (min) (ACUTE ONLY): 18 min   Charges:   PT Evaluation $PT Eval Low Complexity: 1 Low   PT General Charges $$ ACUTE PT VISIT: 1 Visit           Faye Ramsay, PT Acute Rehabilitation  Office: [phone]

## 2023-06-18 NOTE — Progress Notes (Signed)
[facility] PROGRESS NOTE    Progress Note  Antonio Serrano  XBM:[id number] DOB: 06/18/1946 DOA: 06/14/2023 PCP: Jeoffrey Massed, MD     Brief Narrative:   Antonio Serrano is an 77 y.o. male past medical history significant for chronic hyponatremia, chronic kidney stage III, essential hypertension, prior history of CVA on Plavix admitted with an upper GI bleed was having nausea and vomiting for at least 4 days prior to admission 1 day prior to admission started having hematemesis, and eventually coffee-ground.  He does relate he had about a fall about 10 to 7 days prior to admission where he hit his chest, chest x-ray showed a nondisplaced fracture and old here ninth rib fracture   Assessment/Plan:   Upper GI bleed probably due to [facility] ring and gastritis Plavix has been held.  Resume Plavix in 24 hours. Hemoglobin has been stable. PT OT eval is pending.  Hypovolemic hyponatremia: Appears to be chronic currently asymptomatic on salt tablets at home.   Improved with IV fluids.  Hypokalemia: Resolved  Chronic kidney see stage II: With a baseline creatinine of around 1 on admission 1.2 continue to monitor closely.  Type 2 diabetes mellitus with stage 3a chronic kidney disease, without long-term current use of insulin  With a last A1c of 6.5 about a year ago. Blood glucose is well-controlled on no oral hypoglycemic agents.   DVT prophylaxis: scd Family Communication:none Status is: Inpatient Remains inpatient appropriate because: Acute upper GI bleed    Code Status:     Code Status Orders  (From admission, onward)           Start     Ordered   06/14/23 1642  Full code  Continuous       Question:  By:  Answer:  Consent: discussion documented in EHR   06/14/23 1642           Code Status History     Date Active Date Inactive Code Status Order ID Comments User Context   04/19/2023 1611 04/24/2023 0045 Full Code [id number]  Antonio Hanlon, MD ED   01/04/2022  1529 01/05/2022 2003 Full Code [id number]  Antonio General, MD ED   04/23/2021 0118 04/25/2021 1649 Full Code [id number]  Antonio Baars, MD ED      Advance Directive Documentation    Flowsheet Row Most Recent Value  Type of Advance Directive Living will  Pre-existing out of facility DNR order (yellow form or pink MOST form) --  "MOST" Form in Place? --         IV Access:   Peripheral IV   Procedures and diagnostic studies:   No results found.   Medical Consultants:   None.   Subjective:    Antonio Serrano no complaints this morning  Objective:    Vitals:   06/17/23 1008 06/17/23 1234 06/17/23 2125 06/18/23 0437  BP: 117/85 (!) 158/85 (!) 162/79 (!) 177/94  Pulse: 77 62 65 (!) 56  Resp:  18 18 20   Temp:  97.8 F (36.6 C) 98.1 F (36.7 C) 97.8 F (36.6 C)  TempSrc:  Oral Oral Oral  SpO2:  98% 99% 97%  Weight:      Height:       SpO2: 97 % O2 Flow Rate (L/min): 6 L/min   Intake/Output Summary (Last 24 hours) at 06/18/2023 0751 Last data filed at 06/18/2023 0400 Gross per 24 hour  Intake 873.4 ml  Output 2200 ml  Net -1326.6 ml   [employer]  06/14/23 2009 06/16/23 0902  Weight: 64 kg 64 kg    Exam: Serrano exam: In no acute distress. Respiratory system: Good air movement and clear to auscultation. Cardiovascular system: S1 & S2 heard, RRR. No JVD. Gastrointestinal system: Abdomen is nondistended, soft and nontender.  Extremities: No pedal edema. Skin: No rashes, lesions or ulcers Psychiatry: Judgement and insight appear normal. Mood & affect appropriate. Data Reviewed:    Labs: Basic Metabolic Panel: Recent Labs  Lab 06/14/23 1242 06/15/23 0446 06/16/23 0349 06/17/23 0413 06/18/23 0409  NA 124* 128* 130* 126* 128*  K 3.8 3.2* 3.4* 3.8 3.6  CL 90* 95* 99 98 97*  CO2 22 24 24  19* 22  GLUCOSE 173* 93 83 69* 87  BUN 8 9 7* 7* 7*  CREATININE 1.25* 1.13 0.99 1.02 0.90  CALCIUM 9.2 8.6* 8.5* 8.7* 8.7*   GFR Estimated Creatinine  Clearance: 64 mL/min (by C-G formula based on SCr of 0.9 mg/dL). Liver Function Tests: Recent Labs  Lab 06/14/23 1242  AST 19  ALT 14  ALKPHOS 141*  BILITOT 1.2  PROT 7.6  ALBUMIN 4.2   Recent Labs  Lab 06/14/23 1242  LIPASE 48   No results for input(s): "AMMONIA" in the last 168 hours. Coagulation profile Recent Labs  Lab 06/15/23 0714  INR 1.1   COVID-19 Labs  No results for input(s): "DDIMER", "FERRITIN", "LDH", "CRP" in the last 72 hours.  Lab Results  Component Value Date   SARSCOV2NAA NEGATIVE 01/04/2022   SARSCOV2NAA NEGATIVE 04/23/2021   SARSCOV2NAA NEGATIVE 02/04/2021    CBC: Recent Labs  Lab 06/14/23 1242 06/15/23 0446 06/16/23 0349  WBC 8.3 6.0 6.4  HGB 16.1 14.1 14.4  HCT 45.7 40.4 42.9  MCV 83.7 85.2 87.6  PLT 363 281 262   Cardiac Enzymes: No results for input(s): "CKTOTAL", "CKMB", "CKMBINDEX", "TROPONINI" in the last 168 hours. BNP (last 3 results) No results for input(s): "PROBNP" in the last 8760 hours. CBG: No results for input(s): "GLUCAP" in the last 168 hours. D-Dimer: No results for input(s): "DDIMER" in the last 72 hours. Hgb A1c: No results for input(s): "HGBA1C" in the last 72 hours. Lipid Profile: No results for input(s): "CHOL", "HDL", "LDLCALC", "TRIG", "CHOLHDL", "LDLDIRECT" in the last 72 hours. Thyroid function studies: No results for input(s): "TSH", "T4TOTAL", "T3FREE", "THYROIDAB" in the last 72 hours.  Invalid input(s): "FREET3" Anemia work up: No results for input(s): "VITAMINB12", "FOLATE", "FERRITIN", "TIBC", "IRON", "RETICCTPCT" in the last 72 hours. Sepsis Labs: Recent Labs  Lab 06/14/23 1242 06/15/23 0446 06/16/23 0349  WBC 8.3 6.0 6.4   Microbiology No results found for this or any previous visit (from the past 240 hour(s)).   Medications:    amLODipine  2.5 mg Oral Daily   pantoprazole  40 mg Oral BID AC   sodium chloride  1 g Oral BID WC   sucralfate  1 g Oral TID WC & HS   Continuous  Infusions:  sodium chloride        LOS: 4 days   Marinda Elk  [facility]  06/18/2023, 7:51 AM

## 2023-06-18 NOTE — NC FL2 (Signed)
[place] MEDICAID FL2 LEVEL OF CARE FORM     IDENTIFICATION  Patient Name: Antonio Serrano Birthdate: 1946/09/07 Sex: male Admission Date (Current Location): 06/14/2023  [facility] and [place] Number:  Producer, television/film/video and Address:  [facility],  [address] [place], [place] [address]      Provider Number: [id number]  Attending Physician Name and Address:  David Stall, Darin Engels, MD  Relative Name and Phone Number:  Chalmers, Poyser (Spouse)  [phone] (Mobile    Current Level of Care: Hospital Recommended Level of Care: Skilled Nursing Facility Prior Approval Number:    Date Approved/Denied:   PASRR Number: [id number]  Discharge Plan: SNF    Current Diagnoses: Patient Active Problem List   Diagnosis Date Noted   Esophageal dysphagia 06/16/2023   Gastritis and gastroduodenitis 06/16/2023   Coffee ground emesis 06/16/2023   Acute blood loss anemia 06/15/2023   Upper GI bleed 06/14/2023   UTI (urinary tract infection) 04/19/2023   Thigh pain 04/19/2023   Vomiting 04/19/2023   TIA (transient ischemic attack) 01/04/2022   Hyponatremia    Closed right hip fracture, initial encounter (HCC) 04/23/2021   Diabetes mellitus with complication (HCC) 03/27/2016   Hyperlipidemia 07/29/2015   Conjunctivitis 06/27/2015   Healthcare maintenance 06/27/2015   Type 2 diabetes mellitus with stage 3a chronic kidney disease, without long-term current use of insulin (HCC) 07/16/2014   Constipation, chronic 07/16/2014   Preventative health care 05/18/2014   Testicular pain, right 06/25/2013   Erectile dysfunction 05/31/2013   Chronic renal insufficiency, stage III (moderate) (HCC)    HTN (hypertension), benign 01/30/2012   Tobacco dependence 01/30/2012    Orientation RESPIRATION BLADDER Height & Weight     Self, Time, Situation, Place  Normal Continent Weight: 64 kg Height:  5\' 6"  (167.6 cm)  BEHAVIORAL SYMPTOMS/MOOD NEUROLOGICAL BOWEL NUTRITION STATUS      Continent  Diet (soft diet)  AMBULATORY STATUS COMMUNICATION OF NEEDS Skin   Limited Assist Verbally Other (Comment) (Ecchymosis to Right arm and hand and bilateral buttocks)                       Personal Care Assistance Level of Assistance  Bathing, Feeding, Dressing Bathing Assistance: Limited assistance Feeding assistance: Limited assistance Dressing Assistance: Limited assistance     Functional Limitations Info  Sight, Hearing, Speech Sight Info: Impaired Hearing Info: Impaired Speech Info: Adequate    SPECIAL CARE FACTORS FREQUENCY  PT (By licensed PT), OT (By licensed OT)     PT Frequency: 5x/wk OT Frequency: 5x/wk            Contractures Contractures Info: Not present    Additional Factors Info  Code Status, Allergies, Psychotropic Code Status Info: Full Code Allergies Info: Bactrim (Sulfamethoxazole-trimethoprim) Psychotropic Info: N/A         Current Medications (06/18/2023):  This is the current hospital active medication list Current Facility-Administered Medications  Medication Dose Route Frequency Provider Last Rate Last Admin   0.9 %  sodium chloride infusion   Intravenous Continuous Marinda Elk, MD       acetaminophen (TYLENOL) tablet 650 mg  650 mg Oral Q6H PRN Maryln Gottron, MD   650 mg at 06/17/23 1033   Or   acetaminophen (TYLENOL) suppository 650 mg  650 mg Rectal Q6H PRN Kirby Crigler, Mir M, MD       amLODipine (NORVASC) tablet 2.5 mg  2.5 mg Oral Daily Marinda Elk, MD   2.5 mg at 06/18/23 [phone]  hydrALAZINE (APRESOLINE) tablet 10 mg  10 mg Oral Q6H PRN Marinda Elk, MD       ondansetron [facility]) tablet 4 mg  4 mg Oral Q6H PRN Kirby Crigler, Mir M, MD       pantoprazole (PROTONIX) EC tablet 40 mg  40 mg Oral BID AC Mansouraty, Netty Starring., MD   40 mg at 06/18/23 0846   sodium chloride tablet 1 g  1 g Oral BID WC Kirby Crigler, Mir M, MD   1 g at 06/18/23 0846   sucralfate (CARAFATE) 1 GM/10ML suspension 1 g  1 g Oral TID WC & HS  Mansouraty, Netty Starring., MD   1 g at 06/18/23 0845   traZODone (DESYREL) tablet 50 mg  50 mg Oral QHS PRN Marinda Elk, MD   50 mg at 06/15/23 2226     Discharge Medications: Please see discharge summary for a list of discharge medications.  Relevant Imaging Results:  Relevant Lab Results:   Additional Information SSN: [id number]  Howell Rucks, RN

## 2023-06-18 NOTE — TOC Progression Note (Addendum)
Transition of Care [facility]) - Progression Note    Patient Details  Name: Antonio Serrano MRN: [id number] Date of Birth: Jun 26, 1946  [facility] CM/SW Contact  Howell Rucks, RN Phone Number: 06/18/2023, 12:25 PM  Clinical Narrative: PT eval completed, recommendation for short term rehab-SNF, pt agreeable, prefers [facility] in [place], faxed out for  bed offers.      -3:09pm Bed offer received from [facility] in [place], [place], pt accepted bed offer. [employer] [place] auth initiated via [place], [place]ley [place] ID [id number], pending, await determination.       Barriers to Discharge: Continued Medical Work up  Expected Discharge Plan and Services                                               Social Determinants of Health (SDOH) Interventions SDOH Screenings   Food Insecurity: No Food Insecurity (06/14/2023)  Housing: Low Risk  (06/14/2023)  Transportation Needs: No Transportation Needs (06/14/2023)  Utilities: Not At Risk (06/14/2023)  Alcohol Screen: Low Risk  (11/16/2020)  Depression (PHQ2-9): High Risk (06/10/2023)  Financial Resource Strain: Low Risk  (11/28/2022)  Physical Activity: Insufficiently Active (11/28/2022)  Social Connections: Moderately Isolated (11/28/2022)  Stress: No Stress Concern Present (11/28/2022)  Tobacco Use: High Risk (06/16/2023)    Readmission Risk Interventions    06/18/2023   10:27 AM 04/21/2023    3:12 PM  Readmission Risk Prevention Plan  Transportation Screening Complete Complete  PCP or Specialist Appt within 5-7 Days Complete Complete  Home Care Screening Complete Complete  Medication Review (RN CM) Complete Complete

## 2023-06-18 NOTE — Plan of Care (Signed)
°  Problem: Clinical Measurements: °Goal: Diagnostic test results will improve °Outcome: Progressing °  °Problem: Activity: °Goal: Risk for activity intolerance will decrease °Outcome: Progressing °  °Problem: Coping: °Goal: Level of anxiety will decrease °Outcome: Progressing °  °Problem: Pain Managment: °Goal: General experience of comfort will improve °Outcome: Progressing °  °

## 2023-06-19 DIAGNOSIS — K922 Gastrointestinal hemorrhage, unspecified: Secondary | ICD-10-CM | POA: Diagnosis not present

## 2023-06-19 DIAGNOSIS — N1831 Chronic kidney disease, stage 3a: Secondary | ICD-10-CM | POA: Diagnosis not present

## 2023-06-19 DIAGNOSIS — R1319 Other dysphagia: Secondary | ICD-10-CM | POA: Diagnosis not present

## 2023-06-19 DIAGNOSIS — D62 Acute posthemorrhagic anemia: Secondary | ICD-10-CM | POA: Diagnosis not present

## 2023-06-19 MED ORDER — PANTOPRAZOLE SODIUM 40 MG PO TBEC: 40.0000 mg | DELAYED_RELEASE_TABLET | Freq: Two times a day (BID) | ORAL | Status: AC

## 2023-06-19 MED ORDER — SUCRALFATE 1 GM/10ML PO SUSP: 1.0000 g | Freq: Three times a day (TID) | ORAL | 0 refills | Status: AC

## 2023-06-19 NOTE — Discharge Summary (Addendum)
Physician Discharge Summary  Antonio Serrano OZH:[id number] DOB: 08/27/1946 DOA: 06/14/2023  PCP: Jeoffrey Massed, MD  Admit date: 06/14/2023 Discharge date: 06/19/2023  Admitted From: Home Disposition:  SNF  Recommendations for Outpatient Follow-up:  Follow up with PCP in 1-2 weeks Please obtain BMP/CBC in one week   Home Health:No Equipment/Devices:None  Discharge Condition:Stable CODE STATUS:Full Diet recommendation: Heart Healthy  Brief/Interim Summary:  76 y.o. male past medical history significant for chronic hyponatremia, chronic kidney stage III, essential hypertension, prior history of CVA on Plavix admitted with an upper GI bleed was having nausea and vomiting for at least 4 days prior to admission 1 day prior to admission started having hematemesis, and eventually coffee-ground.  He does relate he had about a fall about 10 to 7 days prior to admission where he hit his chest, chest x-ray showed a nondisplaced fracture and old here ninth rib fracture   Discharge Diagnoses:  Principal Problem:   Upper GI bleed Active Problems:   Chronic renal insufficiency, stage III (moderate) (HCC)   Type 2 diabetes mellitus with stage 3a chronic kidney disease, without long-term current use of insulin (HCC)   Hyponatremia   Acute blood loss anemia   Esophageal dysphagia   Gastritis and gastroduodenitis   Coffee ground emesis  Upper GI bleed likely due gastritis probably due to Schatzki ring and gastritis : Started on IV Protonix. GI was consulted who performed EGD that showed gastritis and narrowing Schatzki ring status post dilation. GI recommended Protonix and sucralfate hemoglobin remained stable he will resume Plavix tomorrow. Follow-up with GI as an outpatient.  Hypovolemic hyponatremia: Resolved with IV fluids.  Hypokalemia: Repleted now resolved.  Chronic kidney see stage II: Creatinine remained at baseline.  Diabetes mellitus type 2 with stage III chronic kidney  disease without long-term use of insulin: His A1c 6.5, he is on no oral hypoglycemic agents at home.   Discharge Instructions  Discharge Instructions     Diet - low sodium heart healthy   Complete by: As directed    Increase activity slowly   Complete by: As directed       Allergies as of 06/19/2023       Reactions   Bactrim [sulfamethoxazole-trimethoprim] Nausea Only        Medication List     TAKE these medications    acetaminophen 500 MG tablet Commonly known as: TYLENOL Take 1,000 mg by mouth as needed for moderate pain.   amLODipine 2.5 MG tablet Commonly known as: NORVASC Take 1 tablet (2.5 mg total) by mouth daily.   clopidogrel 75 MG tablet Commonly known as: PLAVIX Take 1 tablet (75 mg total) by mouth daily.   ipratropium 0.03 % nasal spray Commonly known as: ATROVENT USE 2 SPRAY(S) IN EACH NOSTRIL EVERY 12 HOURS What changed:  how much to take how to take this when to take this reasons to take this additional instructions   OVER THE COUNTER MEDICATION Take 1 tablet by mouth daily. Elderberry, zinc, and vitamin C combo medication OTC.   pantoprazole 40 MG tablet Commonly known as: PROTONIX Take 1 tablet (40 mg total) by mouth 2 (two) times daily before a meal. What changed: when to take this   polyethylene glycol 17 g packet Commonly known as: MIRALAX / GLYCOLAX Take 17 g by mouth daily as needed for mild constipation.   sodium chloride 1 g tablet Take 1 tablet (1 g total) by mouth 2 (two) times daily with a meal.   STOOL SOFTENER PO  Take 1 tablet by mouth daily as needed (constipation).   sucralfate 1 GM/10ML suspension Commonly known as: CARAFATE Take 10 mLs (1 g total) by mouth 4 (four) times daily -  with meals and at bedtime.   traZODone 50 MG tablet Commonly known as: DESYREL TAKE 1 TABLET BY MOUTH EVERY DAY AT BEDTIME AS NEEDED FOR SLEEP        Contact information for after-discharge care     Destination      [facility] Preferred SNF .   Service: Skilled Nursing Contact information: [address] [place] [address] [phone]                    Allergies  Allergen Reactions   Bactrim [Sulfamethoxazole-Trimethoprim] Nausea Only    Consultations: GI   Procedures/Studies: DG Ribs Unilateral W/Chest Right  Result Date: 06/14/2023 CLINICAL DATA:  Mechanical fall 1 week ago with persistent right shoulder and rib pain. EXAM: RIGHT RIBS AND CHEST - 3+ VIEW COMPARISON:  Right shoulder radiographs-earlier same day; chest radiograph-02/05/2022 FINDINGS: Old/healed right ninth rib fracture, similar to chest radiograph performed 02/05/2022. No acute displaced fractures. Regional soft tissues appear normal. No radiopaque foreign body. Limited visualization of the adjacent thorax is normal given obliquity and technique. Stigmata of dish within the thoracic spine. IMPRESSION: 1. No acute displaced fractures. 2. Old/healed right ninth rib fracture. Electronically Signed   By: Simonne Come M.D.   On: 06/14/2023 15:23   DG Shoulder Right  Result Date: 06/14/2023 CLINICAL DATA:  Mechanical fall 1 week ago with persistent right shoulder pain. EXAM: RIGHT SHOULDER - 2+ VIEW COMPARISON:  Right rib radiographic series-earlier same day FINDINGS: No fracture or dislocation. Glenohumeral joint spaces appear preserved. Moderate degenerative change of the right AC joint with joint space loss, subchondral sclerosis and inferiorly directed osteophytosis. No evidence of calcific tendinitis. Old/healed right-sided rib fracture. Limited visualization of the adjacent thorax is otherwise normal. Regional soft tissues appear normal. IMPRESSION: 1. No acute findings. 2. Moderate degenerative change of the right AC joint. Electronically Signed   By: Simonne Come M.D.   On: 06/14/2023 15:21     Subjective: No complains  Discharge Exam: Vitals:   06/18/23  2129 06/19/23 0455  BP: (!) 158/92 (!) 150/62  Pulse: 67 65  Resp: 16 16  Temp: 97.9 F (36.6 C) 98.4 F (36.9 C)  SpO2: 97% 97%   Vitals:   06/18/23 0437 06/18/23 1350 06/18/23 2129 06/19/23 0455  BP: (!) 177/94 (!) 167/92 (!) 158/92 (!) 150/62  Pulse: (!) 56 67 67 65  Resp: 20 16 16 16   Temp: 97.8 F (36.6 C) 98.4 F (36.9 C) 97.9 F (36.6 C) 98.4 F (36.9 C)  TempSrc: Oral Oral Oral Oral  SpO2: 97% 97% 97% 97%  Weight:      Height:        General: Pt is alert, awake, not in acute distress Cardiovascular: RRR, S1/S2 +, no rubs, no gallops Respiratory: CTA bilaterally, no wheezing, no rhonchi Abdominal: Soft, NT, ND, bowel sounds + Extremities: no edema, no cyanosis    The results of significant diagnostics from this hospitalization (including imaging, microbiology, ancillary and laboratory) are listed below for reference.     Microbiology: No results found for this or any previous visit (from the past 240 hour(s)).   Labs: BNP (last 3 results) No results for input(s): "BNP" in the last 8760 hours. Basic Metabolic Panel: Recent Labs  Lab 06/14/23  1242 06/15/23 0446 06/16/23 0349 06/17/23 0413 06/18/23 0409  NA 124* 128* 130* 126* 128*  K 3.8 3.2* 3.4* 3.8 3.6  CL 90* 95* 99 98 97*  CO2 22 24 24  19* 22  GLUCOSE 173* 93 83 69* 87  BUN 8 9 7* 7* 7*  CREATININE 1.25* 1.13 0.99 1.02 0.90  CALCIUM 9.2 8.6* 8.5* 8.7* 8.7*   Liver Function Tests: Recent Labs  Lab 06/14/23 1242  AST 19  ALT 14  ALKPHOS 141*  BILITOT 1.2  PROT 7.6  ALBUMIN 4.2   Recent Labs  Lab 06/14/23 1242  LIPASE 48   No results for input(s): "AMMONIA" in the last 168 hours. CBC: Recent Labs  Lab 06/14/23 1242 06/15/23 0446 06/16/23 0349  WBC 8.3 6.0 6.4  HGB 16.1 14.1 14.4  HCT 45.7 40.4 42.9  MCV 83.7 85.2 87.6  PLT 363 281 262   Cardiac Enzymes: No results for input(s): "CKTOTAL", "CKMB", "CKMBINDEX", "TROPONINI" in the last 168 hours. BNP: Invalid input(s):  "POCBNP" CBG: No results for input(s): "GLUCAP" in the last 168 hours. D-Dimer No results for input(s): "DDIMER" in the last 72 hours. Hgb A1c No results for input(s): "HGBA1C" in the last 72 hours. Lipid Profile No results for input(s): "CHOL", "HDL", "LDLCALC", "TRIG", "CHOLHDL", "LDLDIRECT" in the last 72 hours. Thyroid function studies No results for input(s): "TSH", "T4TOTAL", "T3FREE", "THYROIDAB" in the last 72 hours.  Invalid input(s): "FREET3" Anemia work up No results for input(s): "VITAMINB12", "FOLATE", "FERRITIN", "TIBC", "IRON", "RETICCTPCT" in the last 72 hours. Urinalysis    Component Value Date/Time   COLORURINE AMBER (A) 06/14/2023 1643   APPEARANCEUR CLEAR 06/14/2023 1643   LABSPEC 1.014 06/14/2023 1643   PHURINE 5.0 06/14/2023 1643   GLUCOSEU NEGATIVE 06/14/2023 1643   HGBUR NEGATIVE 06/14/2023 1643   BILIRUBINUR NEGATIVE 06/14/2023 1643   BILIRUBINUR neg 11/12/2022 1138   KETONESUR NEGATIVE 06/14/2023 1643   PROTEINUR 30 (A) 06/14/2023 1643   UROBILINOGEN 0.2 11/12/2022 1138   NITRITE NEGATIVE 06/14/2023 1643   LEUKOCYTESUR NEGATIVE 06/14/2023 1643   Sepsis Labs Recent Labs  Lab 06/14/23 1242 06/15/23 0446 06/16/23 0349  WBC 8.3 6.0 6.4   Microbiology No results found for this or any previous visit (from the past 240 hour(s)).   Time coordinating discharge: Over 35 minutes  SIGNED:   Marinda Elk, MD  [facility] 06/19/2023, 8:53 AM Pager   If 7PM-7AM, please contact night-coverage [email or web address] Password [email or web address]

## 2023-06-19 NOTE — Progress Notes (Signed)
Patient received discharge orders to go home with home health PT/OT. Patient was given discharge paperwork/instructions. Another RN assisted primary RN in going over discharge paperwork/instructions with the patient and family member. All questions/concerns were addressed/answered during that time. Patient left the hospital stable, had discharge paperwork/instructions, and had all personal belongings.

## 2023-06-19 NOTE — TOC Transition Note (Addendum)
Transition of Care [facility]) - CM/SW Discharge Note   Patient Details  Name: Antonio Serrano MRN: [id number] Date of Birth: [birth year]  Transition of Care [facility]) CM/SW Contact:  Howell Rucks, RN Phone Number: 06/19/2023, 10:23 AM   Clinical Narrative:  DC to short term rehab/SNF today-[facility], met with pt at bedside, confirmed his dtr will transport him to the facility.  Call received from [facility] with [facility], informed of insurance approval for SNF-short term rehab, confirmed bed available today for transfer. RM 39, Nurse Call Report [phone]. No further TOC needs identified.   -11:05am Text received from [facility] rep-Kristine, reports family onsite to sign admission paperwork, states family unable to afford co payments. Spoke with pt's dtr [name]) on speaker phone and pt at bedside, offered review of other short term rehab/SNF bed offers,dtr/pt declined, request HH PT/OT, no preference, NCM will assist with [facility] PT/OT provider, dtr reports on her way to hospital for transport of pt, team notified.    -11:11am [facility] PT/OT, rep-Ashley, added to AVS.     Final next level of care: Skilled Nursing Facility Barriers to Discharge: Barriers Resolved   Patient Goals and CMS Choice CMS Medicare.gov Compare Post Acute Care list provided to:: Patient Choice offered to / list presented to : Patient  Discharge Placement                Patient chooses bed at: [facility] Patient to be transferred to facility by: family member (dtr) Name of family member notified: Lawson Fiscal [phone]; [name] [phone] (notfiied by pt) Patient and family notified of of transfer: 06/19/23  Discharge Plan and Services Additional resources added to the After Visit Summary for                                       Social Determinants of Health (SDOH) Interventions SDOH Screenings   Food Insecurity: No Food Insecurity (06/14/2023)  Housing: Low Risk   (06/14/2023)  Transportation Needs: No Transportation Needs (06/14/2023)  Utilities: Not At Risk (06/14/2023)  Alcohol Screen: Low Risk  (11/16/2020)  Depression (PHQ2-9): High Risk (06/10/2023)  Financial Resource Strain: Low Risk  (11/28/2022)  Physical Activity: Insufficiently Active (11/28/2022)  Social Connections: Moderately Isolated (11/28/2022)  Stress: No Stress Concern Present (11/28/2022)  Tobacco Use: High Risk (06/16/2023)     Readmission Risk Interventions    06/19/2023   10:18 AM 06/18/2023   10:27 AM 04/21/2023    3:12 PM  Readmission Risk Prevention Plan  Transportation Screening Complete Complete Complete  PCP or Specialist Appt within 5-7 Days Complete Complete Complete  Home Care Screening Complete Complete Complete  Medication Review (RN CM) Complete Complete Complete

## 2023-06-19 NOTE — Plan of Care (Signed)

## 2023-06-19 NOTE — TOC Progression Note (Signed)
Transition of Care [facility]) - Progression Note    Patient Details  Name: Antonio Serrano MRN: [id number] Date of Birth: 27-May-1946  Transition of Care [facility]) CM/SW Contact  Howell Rucks, RN Phone Number: 06/19/2023, 9:31 AM  Clinical Narrative:   Per Fransico Him, request for short term rehab-SNF approved Auth ID [id number], days approved 06/18/2023-06/18/2023-06/20/2023, team notified      Barriers to Discharge: Continued Medical Work up  Expected Discharge Plan and Services         Expected Discharge Date: 06/19/23                                     Social Determinants of Health (SDOH) Interventions SDOH Screenings   Food Insecurity: No Food Insecurity (06/14/2023)  Housing: Low Risk  (06/14/2023)  Transportation Needs: No Transportation Needs (06/14/2023)  Utilities: Not At Risk (06/14/2023)  Alcohol Screen: Low Risk  (11/16/2020)  Depression (PHQ2-9): High Risk (06/10/2023)  Financial Resource Strain: Low Risk  (11/28/2022)  Physical Activity: Insufficiently Active (11/28/2022)  Social Connections: Moderately Isolated (11/28/2022)  Stress: No Stress Concern Present (11/28/2022)  Tobacco Use: High Risk (06/16/2023)    Readmission Risk Interventions    06/18/2023   10:27 AM 04/21/2023    3:12 PM  Readmission Risk Prevention Plan  Transportation Screening Complete Complete  PCP or Specialist Appt within 5-7 Days Complete Complete  Home Care Screening Complete Complete  Medication Review (RN CM) Complete Complete

## 2023-06-20 ENCOUNTER — Telehealth: Payer: Self-pay | Admitting: Family Medicine

## 2023-06-20 DIAGNOSIS — E1122 Type 2 diabetes mellitus with diabetic chronic kidney disease: Secondary | ICD-10-CM | POA: Diagnosis not present

## 2023-06-20 DIAGNOSIS — M48061 Spinal stenosis, lumbar region without neurogenic claudication: Secondary | ICD-10-CM | POA: Diagnosis not present

## 2023-06-20 DIAGNOSIS — K2990 Gastroduodenitis, unspecified, without bleeding: Secondary | ICD-10-CM | POA: Diagnosis not present

## 2023-06-20 DIAGNOSIS — Z8673 Personal history of transient ischemic attack (TIA), and cerebral infarction without residual deficits: Secondary | ICD-10-CM | POA: Diagnosis not present

## 2023-06-20 DIAGNOSIS — Z7902 Long term (current) use of antithrombotics/antiplatelets: Secondary | ICD-10-CM | POA: Diagnosis not present

## 2023-06-20 DIAGNOSIS — K222 Esophageal obstruction: Secondary | ICD-10-CM | POA: Diagnosis not present

## 2023-06-20 DIAGNOSIS — I2510 Atherosclerotic heart disease of native coronary artery without angina pectoris: Secondary | ICD-10-CM | POA: Diagnosis not present

## 2023-06-20 DIAGNOSIS — D62 Acute posthemorrhagic anemia: Secondary | ICD-10-CM | POA: Diagnosis not present

## 2023-06-20 DIAGNOSIS — M19011 Primary osteoarthritis, right shoulder: Secondary | ICD-10-CM | POA: Diagnosis not present

## 2023-06-20 DIAGNOSIS — K920 Hematemesis: Secondary | ICD-10-CM | POA: Diagnosis not present

## 2023-06-20 DIAGNOSIS — R1310 Dysphagia, unspecified: Secondary | ICD-10-CM | POA: Diagnosis not present

## 2023-06-20 DIAGNOSIS — E1140 Type 2 diabetes mellitus with diabetic neuropathy, unspecified: Secondary | ICD-10-CM | POA: Diagnosis not present

## 2023-06-20 DIAGNOSIS — Z9181 History of falling: Secondary | ICD-10-CM | POA: Diagnosis not present

## 2023-06-20 DIAGNOSIS — Z604 Social exclusion and rejection: Secondary | ICD-10-CM | POA: Diagnosis not present

## 2023-06-20 DIAGNOSIS — K5790 Diverticulosis of intestine, part unspecified, without perforation or abscess without bleeding: Secondary | ICD-10-CM | POA: Diagnosis not present

## 2023-06-20 DIAGNOSIS — E785 Hyperlipidemia, unspecified: Secondary | ICD-10-CM | POA: Diagnosis not present

## 2023-06-20 DIAGNOSIS — Z96641 Presence of right artificial hip joint: Secondary | ICD-10-CM | POA: Diagnosis not present

## 2023-06-20 DIAGNOSIS — I129 Hypertensive chronic kidney disease with stage 1 through stage 4 chronic kidney disease, or unspecified chronic kidney disease: Secondary | ICD-10-CM | POA: Diagnosis not present

## 2023-06-20 DIAGNOSIS — S2239XD Fracture of one rib, unspecified side, subsequent encounter for fracture with routine healing: Secondary | ICD-10-CM | POA: Diagnosis not present

## 2023-06-20 DIAGNOSIS — Z87440 Personal history of urinary (tract) infections: Secondary | ICD-10-CM | POA: Diagnosis not present

## 2023-06-20 DIAGNOSIS — K449 Diaphragmatic hernia without obstruction or gangrene: Secondary | ICD-10-CM | POA: Diagnosis not present

## 2023-06-20 DIAGNOSIS — F17210 Nicotine dependence, cigarettes, uncomplicated: Secondary | ICD-10-CM | POA: Diagnosis not present

## 2023-06-20 DIAGNOSIS — N1831 Chronic kidney disease, stage 3a: Secondary | ICD-10-CM | POA: Diagnosis not present

## 2023-06-20 NOTE — Telephone Encounter (Signed)
Caller/Agency: Brian/ [facility]  Callback Number: [phone] Requesting OT/PT/Skilled Nursing/Social Work/Speech Therapy: Didn't specify and forgot to ask  Frequency: 2 week 4 and 1 week 4

## 2023-06-21 ENCOUNTER — Telehealth: Payer: Self-pay

## 2023-06-21 NOTE — Telephone Encounter (Signed)
Antonio Serrano was advised approval for requested v/o.

## 2023-06-21 NOTE — Transitions of Care (Post Inpatient/ED Visit) (Signed)
06/21/2023  Name: Antonio Serrano MRN: [id number] DOB: [birth year]  Today's TOC FU Call Status: Today's TOC FU Call Status:: Successful TOC FU Call Completed TOC FU Call Complete Date: 06/21/23 Patient's Name and Date of Birth confirmed.  Transition Care Management Follow-up Telephone Call Date of Discharge: 06/19/23 Discharge Facility: [facility]) Type of Discharge: Inpatient Admission Primary Inpatient Discharge Diagnosis:: "upper GI bleed" How have you been since you were released from the hospital?: Same (Wife voices that pt -"remains weak-fell earlier this AM trying to walk to bathroom-has skin tear to right arm-no other injuries"-Appetite decresed-drinking nutritional shakes 1-2/day. LBM yest.) Any questions or concerns?: Yes Patient Questions/Concerns:: wife voices that they were unable to afford SNF co-pays so pt unable to go to facility at time of d/c-sent home w/ [facility] services Patient Questions/Concerns Addressed: Other: (Wife declined SW referral for possible in-home support/resources & discussion about other options-states "they will be alright")  Items Reviewed: Did you receive and understand the discharge instructions provided?: Yes Medications obtained,verified, and reconciled?: Yes (Medications Reviewed) Any new allergies since your discharge?: No Dietary orders reviewed?: Yes Type of Diet Ordered:: low salt/heart healthy Do you have support at home?: Yes People in Home: spouse, child(ren), adult Name of Support/Comfort Primary Source: wife, daughter comes in and helps about 3-4hrs/day  Medications Reviewed Today: Medications Reviewed Today     Reviewed by Charlyn Minerva, RN (Registered Nurse) on 06/21/23 at 1321  Med List Status: <None>   Medication Order Taking? Sig Documenting Provider Last Dose Status Informant  acetaminophen (TYLENOL) 500 MG tablet [id number] Yes Take 1,000 mg by mouth as needed for moderate pain. [provider] Taking  Active Self  amLODipine (NORVASC) 2.5 MG tablet [id number] Yes Take 1 tablet (2.5 mg total) by mouth daily. Jeoffrey Massed, MD Taking Active Self  clopidogrel (PLAVIX) 75 MG tablet [id number] Yes Take 1 tablet (75 mg total) by mouth daily. Jeoffrey Massed, MD Taking Active Self  Docusate Calcium (STOOL SOFTENER PO) [id number] Yes Take 1 tablet by mouth daily as needed (constipation). [provider] Taking Active Self           Med Note Charmayne Sheer, SUSAN A   Fri Jun 14, 2023  4:47 PM)    ipratropium (ATROVENT) 0.03 % nasal spray [id number] Yes USE 2 SPRAY(S) IN EACH NOSTRIL EVERY 12 HOURS  Patient taking differently: Place 1 spray into both nostrils as needed for rhinitis.   [name], [name], MD Taking Active Self           Med Note Kandis Cocking Ronnald Nian   Fri Jun 14, 2023  4:46 PM)    OVER THE COUNTER MEDICATION [id number] Yes Take 1 tablet by mouth daily. Elderberry, zinc, and vitamin C combo medication OTC. [provider] Taking Active Self           Med Note (CRUTHIS, CHLOE C   Fri Apr 19, 2023  3:07 PM)    pantoprazole (PROTONIX) 40 MG tablet [id number] Yes Take 1 tablet (40 mg total) by mouth 2 (two) times daily before a meal. Marinda Elk, MD Taking Active   polyethylene glycol (MIRALAX / GLYCOLAX) 17 g packet [id number] Yes Take 17 g by mouth daily as needed for mild constipation. Pokhrel, Rebekah Chesterfield, MD Taking Active Self  sodium chloride 1 g tablet [id number] Yes Take 1 tablet (1 g total) by mouth 2 (two) times daily with a meal. [name], [name], MD Taking Active Self  sucralfate (CARAFATE) 1 GM/10ML  suspension [id number] Yes Take 10 mLs (1 g total) by mouth 4 (four) times daily -  with meals and at bedtime. Marinda Elk, MD Taking Active   traZODone (DESYREL) 50 MG tablet [id number] Yes TAKE 1 TABLET BY MOUTH EVERY DAY AT BEDTIME AS NEEDED FOR SLEEP [name], [name], MD Taking Active Self            Home Care and  Equipment/Supplies: Were Home Health Services Ordered?: Yes Name of Home Health Agency:: [facility] Has Agency set up a time to come to your home?: Yes First Home Health Visit Date: 06/20/23 (Wife voices HHPT came out yesterday and RN coming out this weekend) Any new equipment or medical supplies ordered?: NA  Functional Questionnaire: Do you need assistance with bathing/showering or dressing?: Yes (family assisting with ADLs/IADLs) Do you need assistance with meal preparation?: Yes Do you need assistance with eating?: No Do you have difficulty maintaining continence: No Do you need assistance with getting out of bed/getting out of a chair/moving?: No Do you have difficulty managing or taking your medications?: Yes  Follow up appointments reviewed: PCP Follow-up appointment confirmed?: Yes Date of PCP follow-up appointment?: 06/25/23 Follow-up Provider: Dr. Milinda Cave Specialist [facility] Follow-up appointment confirmed?: NA Do you need transportation to your follow-up appointment?: No (wife confirms that she and daughter will get pt to appt) Do you understand care options if your condition(s) worsen?: Yes-patient verbalized understanding  SDOH Interventions Today    Flowsheet Row Most Recent Value  SDOH Interventions   Food Insecurity Interventions Intervention Not Indicated  Transportation Interventions Intervention Not Indicated      TOC Interventions Today    Flowsheet Row Most Recent Value  TOC Interventions   TOC Interventions Discussed/Reviewed TOC Interventions Discussed      Interventions Today    Flowsheet Row Most Recent Value  Chronic Disease   Chronic disease during today's visit Atrial Fibrillation (AFib), Congestive Heart Failure (CHF)  General Interventions   General Interventions Discussed/Reviewed General Interventions Discussed, Durable Medical Equipment (DME), Doctor Visits, Referral to Nurse  [facility] declined]  Doctor Visits Discussed/Reviewed PCP,  Specialist, Doctor Visits Discussed  Durable Medical Equipment (DME) Dan Humphreys, BP Cuff  PCP/Specialist Visits Compliance with follow-up visit  Education Interventions   Education Provided Provided Education  Provided Verbal Education On Nutrition, When to see the doctor, Medication  Mental Health Interventions   Mental Health Discussed/Reviewed Refer to Social Work for resources  Refer to Social Work for resources regarding Adult Daycare, Other  [wife declined SW referral for in-home support assistance/resources]  Nutrition Interventions   Nutrition Discussed/Reviewed Nutrition Discussed, Adding fruits and vegetables, Decreasing salt, Increasing proteins, Decreasing sugar intake, Fluid intake, Supplemental nutrition  Pharmacy Interventions   Pharmacy Dicussed/Reviewed Pharmacy Topics Discussed, Medications and their functions  Safety Interventions   Safety Discussed/Reviewed Safety Discussed, Fall Risk, Home Safety  Home Safety Assistive Devices       [place], [place] [facility] Care Management Care Management Community Coordinator Direct Phone: [phone] Toll Free: [phone] Fax: [phone]

## 2023-06-21 NOTE — Telephone Encounter (Signed)
Okay for orders?

## 2023-06-21 NOTE — Telephone Encounter (Signed)
Yes, okay.

## 2023-06-24 DIAGNOSIS — M19011 Primary osteoarthritis, right shoulder: Secondary | ICD-10-CM | POA: Diagnosis not present

## 2023-06-24 DIAGNOSIS — K5790 Diverticulosis of intestine, part unspecified, without perforation or abscess without bleeding: Secondary | ICD-10-CM | POA: Diagnosis not present

## 2023-06-24 DIAGNOSIS — K222 Esophageal obstruction: Secondary | ICD-10-CM | POA: Diagnosis not present

## 2023-06-24 DIAGNOSIS — D62 Acute posthemorrhagic anemia: Secondary | ICD-10-CM | POA: Diagnosis not present

## 2023-06-24 DIAGNOSIS — S2239XD Fracture of one rib, unspecified side, subsequent encounter for fracture with routine healing: Secondary | ICD-10-CM | POA: Diagnosis not present

## 2023-06-24 DIAGNOSIS — N1831 Chronic kidney disease, stage 3a: Secondary | ICD-10-CM | POA: Diagnosis not present

## 2023-06-24 DIAGNOSIS — E1140 Type 2 diabetes mellitus with diabetic neuropathy, unspecified: Secondary | ICD-10-CM | POA: Diagnosis not present

## 2023-06-24 DIAGNOSIS — I129 Hypertensive chronic kidney disease with stage 1 through stage 4 chronic kidney disease, or unspecified chronic kidney disease: Secondary | ICD-10-CM | POA: Diagnosis not present

## 2023-06-24 DIAGNOSIS — K920 Hematemesis: Secondary | ICD-10-CM | POA: Diagnosis not present

## 2023-06-24 DIAGNOSIS — R1310 Dysphagia, unspecified: Secondary | ICD-10-CM | POA: Diagnosis not present

## 2023-06-24 DIAGNOSIS — F17210 Nicotine dependence, cigarettes, uncomplicated: Secondary | ICD-10-CM | POA: Diagnosis not present

## 2023-06-24 DIAGNOSIS — E1122 Type 2 diabetes mellitus with diabetic chronic kidney disease: Secondary | ICD-10-CM | POA: Diagnosis not present

## 2023-06-24 DIAGNOSIS — K2990 Gastroduodenitis, unspecified, without bleeding: Secondary | ICD-10-CM | POA: Diagnosis not present

## 2023-06-24 DIAGNOSIS — I2510 Atherosclerotic heart disease of native coronary artery without angina pectoris: Secondary | ICD-10-CM | POA: Diagnosis not present

## 2023-06-24 DIAGNOSIS — K449 Diaphragmatic hernia without obstruction or gangrene: Secondary | ICD-10-CM | POA: Diagnosis not present

## 2023-06-24 DIAGNOSIS — E785 Hyperlipidemia, unspecified: Secondary | ICD-10-CM | POA: Diagnosis not present

## 2023-06-25 ENCOUNTER — Ambulatory Visit: Payer: Medicare Other | Admitting: Family Medicine

## 2023-06-25 NOTE — Progress Notes (Deleted)
06/25/2023  CC: No chief complaint on file.   Patient is a 77 y.o. male who presents for  hospital follow up, specifically Transitional Care Services face-to-face visit. Dates hospitalized: 9/13 to 06/19/2023 Days since d/c from hospital: 6 days Patient was discharged from hospital to : SNF short-term was the plan but patient unable to afford co-pays so he was discharged to home with home health care arrangements. Reason for admission to hospital: Upper GI bleed Date of interactive (phone) contact with patient and/or caregiver: 06/21/2023.  I have reviewed patient's discharge summary plus pertinent specific notes, labs, and imaging from the hospitalization.  He presented with coffee-ground emesis.  Hemoglobin was normal.  Plavix was held short-term. EGD on 06/16/2023 showed reflux esophagitis, gastritis, H. pylori negative. He was put on Protonix 40 mg twice daily, Carafate, and Plavix was resumed.  {current status of patient, symptoms, etc}  Medication reconciliation was done today and patient {is not} {is} taking meds as recommended by discharging hospitalist/specialist.   Discharge medications were: Tylenol 1000 mg every 6 hours as needed pain, amlodipine 2.5 mg daily, Plavix 75 mg daily, pantoprazole 40 mg twice a day, MiraLAX 1 capful as needed, stool softener as needed, Carafate 10 mL every 4 hours, trazodone 50 mg nightly as needed.  ROS: ***  PMH:  Past Medical History:  Diagnosis Date   Atherosclerosis of coronary artery    On chest CT->calcif in LAD and RCA   Chronic low back pain    MR 11/2021 showed L4-5 foraminal stenosis--> plan per Ortho was to do L4-5 injection   Chronic prostatitis    Very mild elevation of PSA (>4), referred to [facility] where PSA repeat was 1.0 on 06/16/13.  Annual PSA repeat is all that is needed now per urologist.  Most recent 07/2015 was 0.45.   Chronic renal insufficiency, stage III (moderate) (HCC)    Baseline GFR as of 2019= 50s.     Closed right  hip fracture (HCC) 03/2021   THA   Diabetes mellitus with complication (HCC) Dx'd fall 2011   HTN (hypertension)    Renal/aortic doppler u/s normal 06/2010   Hypercalcemia    Hyperlipidemia 2016   Statin intolerant--myalgias.  Pt refuses any further trial of statin as of 2018.   Nonspecific abnormal electrocardiogram (ECG) (EKG) Fall 2011   TWI in inferior leads: myocardial perfusion scan neg and echo normal 07/2010   Pseudomonas aeruginosa colonization    12/2021 urine clx   TIA (transient ischemic attack)    12/2021- CT angio head/neck no high grade stenosis.  MR showed old lacunar infarcts corona radiata, basal ganglia, thalamus.  Echo normal.   Tobacco dependence    UTI (lower urinary tract infection)    Feb 2012 (klebsiella--dx at [facility]); 03/2013 e coli.     PSH:  Past Surgical History:  Procedure Laterality Date   BIOPSY  06/16/2023   Procedure: BIOPSY;  Surgeon: Meridee Score Netty Starring., MD;  Location: [facility];  Service: Gastroenterology;;   CARDIOVASCULAR STRESS TEST  07/01/2010   Myocardial perfusion scan neg/low risk.   CATARACT EXTRACTION  2007, 2008   Bilateral ([facility] on [address].   COLONOSCOPY  12/20/04;12/2014   2016 no polyps: recall 5 yrs due to FH of colon ca   ESOPHAGOGASTRODUODENOSCOPY  11/29/2004   esoph stricture/dilation   ESOPHAGOGASTRODUODENOSCOPY N/A 06/16/2023   Procedure: ESOPHAGOGASTRODUODENOSCOPY (EGD);  Surgeon: Lemar Lofty., MD;  Location: [facility];  Service: Gastroenterology;  Laterality: N/A;   SAVORY DILATION N/A 06/16/2023  Procedure: SAVORY DILATION;  Surgeon: Lemar Lofty., MD;  Location: [facility];  Service: Gastroenterology;  Laterality: N/A;   TOTAL HIP ARTHROPLASTY Right 04/23/2021   Procedure: TOTAL HIP ARTHROPLASTY ANTERIOR APPROACH;  Surgeon: Cammy Copa, MD;  Location: [facility];  Service: Orthopedics;  Laterality: Right;  Hana, C-arm, Depuy   TRANSTHORACIC ECHOCARDIOGRAM   07/01/2010   2011 EF =>55%; LA mildly dilated; trace MR/TR.  12/2021 EF 60-65%, DD indeterm, valves nl.    MEDS:  Outpatient Medications Prior to Visit  Medication Sig Dispense Refill   acetaminophen (TYLENOL) 500 MG tablet Take 1,000 mg by mouth as needed for moderate pain.     amLODipine (NORVASC) 2.5 MG tablet Take 1 tablet (2.5 mg total) by mouth daily. 90 tablet 1   clopidogrel (PLAVIX) 75 MG tablet Take 1 tablet (75 mg total) by mouth daily. 90 tablet 1   Docusate Calcium (STOOL SOFTENER PO) Take 1 tablet by mouth daily as needed (constipation).     ipratropium (ATROVENT) 0.03 % nasal spray USE 2 SPRAY(S) IN EACH NOSTRIL EVERY 12 HOURS (Patient taking differently: Place 1 spray into both nostrils as needed for rhinitis.) 30 mL 11   OVER THE COUNTER MEDICATION Take 1 tablet by mouth daily. Elderberry, zinc, and vitamin C combo medication OTC.     pantoprazole (PROTONIX) 40 MG tablet Take 1 tablet (40 mg total) by mouth 2 (two) times daily before a meal.     polyethylene glycol (MIRALAX / GLYCOLAX) 17 g packet Take 17 g by mouth daily as needed for mild constipation.     sodium chloride 1 g tablet Take 1 tablet (1 g total) by mouth 2 (two) times daily with a meal. 60 tablet 1   sucralfate (CARAFATE) 1 GM/10ML suspension Take 10 mLs (1 g total) by mouth 4 (four) times daily -  with meals and at bedtime. 420 mL 0   traZODone (DESYREL) 50 MG tablet TAKE 1 TABLET BY MOUTH EVERY DAY AT BEDTIME AS NEEDED FOR SLEEP 90 tablet 1   No facility-administered medications prior to visit.    Physical Exam    06/19/2023   10:32 AM 06/19/2023    4:55 AM 06/18/2023    9:29 PM  Vitals with BMI  Systolic 114 150 409  Diastolic 82 62 92  Pulse  65 67   ***   Pertinent labs/imaging Last CBC Lab Results  Component Value Date   WBC 6.4 06/16/2023   HGB 14.4 06/16/2023   HCT 42.9 06/16/2023   MCV 87.6 06/16/2023   MCH 29.4 06/16/2023   RDW 14.5 06/16/2023   PLT 262 06/16/2023   Last metabolic  panel Lab Results  Component Value Date   GLUCOSE 87 06/18/2023   NA 128 (L) 06/18/2023   K 3.6 06/18/2023   CL 97 (L) 06/18/2023   CO2 22 06/18/2023   BUN 7 (L) 06/18/2023   CREATININE 0.90 06/18/2023   GFRNONAA >60 06/18/2023   CALCIUM 8.7 (L) 06/18/2023   PHOS 3.7 04/24/2021   PROT 7.6 06/14/2023   ALBUMIN 4.2 06/14/2023   LABGLOB 2.3 02/16/2019   AGRATIO 1.9 02/16/2019   BILITOT 1.2 06/14/2023   ALKPHOS 141 (H) 06/14/2023   AST 19 06/14/2023   ALT 14 06/14/2023   ANIONGAP 9 06/18/2023   Last hemoglobin A1c Lab Results  Component Value Date   HGBA1C 6.2 (A) 02/11/2023   HGBA1C 6.2 02/11/2023   HGBA1C 6.2 02/11/2023   HGBA1C 6.2 02/11/2023   ASSESSMENT/PLAN:  ***  {  Medical decision making of moderate complexity was utilized today} 99495  {Medical decision making of high complexity was utilized today} 99496  FOLLOW UP:  ***  Signed:  Santiago Bumpers, MD           06/25/2023

## 2023-06-26 DIAGNOSIS — K2990 Gastroduodenitis, unspecified, without bleeding: Secondary | ICD-10-CM | POA: Diagnosis not present

## 2023-06-26 DIAGNOSIS — E785 Hyperlipidemia, unspecified: Secondary | ICD-10-CM | POA: Diagnosis not present

## 2023-06-26 DIAGNOSIS — M19011 Primary osteoarthritis, right shoulder: Secondary | ICD-10-CM | POA: Diagnosis not present

## 2023-06-26 DIAGNOSIS — N1831 Chronic kidney disease, stage 3a: Secondary | ICD-10-CM | POA: Diagnosis not present

## 2023-06-26 DIAGNOSIS — D62 Acute posthemorrhagic anemia: Secondary | ICD-10-CM | POA: Diagnosis not present

## 2023-06-26 DIAGNOSIS — E1140 Type 2 diabetes mellitus with diabetic neuropathy, unspecified: Secondary | ICD-10-CM | POA: Diagnosis not present

## 2023-06-26 DIAGNOSIS — K222 Esophageal obstruction: Secondary | ICD-10-CM | POA: Diagnosis not present

## 2023-06-26 DIAGNOSIS — R1310 Dysphagia, unspecified: Secondary | ICD-10-CM | POA: Diagnosis not present

## 2023-06-26 DIAGNOSIS — K449 Diaphragmatic hernia without obstruction or gangrene: Secondary | ICD-10-CM | POA: Diagnosis not present

## 2023-06-26 DIAGNOSIS — I2510 Atherosclerotic heart disease of native coronary artery without angina pectoris: Secondary | ICD-10-CM | POA: Diagnosis not present

## 2023-06-26 DIAGNOSIS — K920 Hematemesis: Secondary | ICD-10-CM | POA: Diagnosis not present

## 2023-06-26 DIAGNOSIS — F17210 Nicotine dependence, cigarettes, uncomplicated: Secondary | ICD-10-CM | POA: Diagnosis not present

## 2023-06-26 DIAGNOSIS — E1122 Type 2 diabetes mellitus with diabetic chronic kidney disease: Secondary | ICD-10-CM | POA: Diagnosis not present

## 2023-06-26 DIAGNOSIS — S2239XD Fracture of one rib, unspecified side, subsequent encounter for fracture with routine healing: Secondary | ICD-10-CM | POA: Diagnosis not present

## 2023-06-26 DIAGNOSIS — K5790 Diverticulosis of intestine, part unspecified, without perforation or abscess without bleeding: Secondary | ICD-10-CM | POA: Diagnosis not present

## 2023-06-26 DIAGNOSIS — I129 Hypertensive chronic kidney disease with stage 1 through stage 4 chronic kidney disease, or unspecified chronic kidney disease: Secondary | ICD-10-CM | POA: Diagnosis not present

## 2023-06-28 DIAGNOSIS — F17210 Nicotine dependence, cigarettes, uncomplicated: Secondary | ICD-10-CM | POA: Diagnosis not present

## 2023-06-28 DIAGNOSIS — S2239XD Fracture of one rib, unspecified side, subsequent encounter for fracture with routine healing: Secondary | ICD-10-CM | POA: Diagnosis not present

## 2023-06-28 DIAGNOSIS — K449 Diaphragmatic hernia without obstruction or gangrene: Secondary | ICD-10-CM | POA: Diagnosis not present

## 2023-06-28 DIAGNOSIS — N1831 Chronic kidney disease, stage 3a: Secondary | ICD-10-CM | POA: Diagnosis not present

## 2023-06-28 DIAGNOSIS — K5790 Diverticulosis of intestine, part unspecified, without perforation or abscess without bleeding: Secondary | ICD-10-CM | POA: Diagnosis not present

## 2023-06-28 DIAGNOSIS — I2510 Atherosclerotic heart disease of native coronary artery without angina pectoris: Secondary | ICD-10-CM | POA: Diagnosis not present

## 2023-06-28 DIAGNOSIS — K222 Esophageal obstruction: Secondary | ICD-10-CM | POA: Diagnosis not present

## 2023-06-28 DIAGNOSIS — R1310 Dysphagia, unspecified: Secondary | ICD-10-CM | POA: Diagnosis not present

## 2023-06-28 DIAGNOSIS — E785 Hyperlipidemia, unspecified: Secondary | ICD-10-CM | POA: Diagnosis not present

## 2023-06-28 DIAGNOSIS — K2990 Gastroduodenitis, unspecified, without bleeding: Secondary | ICD-10-CM | POA: Diagnosis not present

## 2023-06-28 DIAGNOSIS — D62 Acute posthemorrhagic anemia: Secondary | ICD-10-CM | POA: Diagnosis not present

## 2023-06-28 DIAGNOSIS — E1122 Type 2 diabetes mellitus with diabetic chronic kidney disease: Secondary | ICD-10-CM | POA: Diagnosis not present

## 2023-06-28 DIAGNOSIS — I129 Hypertensive chronic kidney disease with stage 1 through stage 4 chronic kidney disease, or unspecified chronic kidney disease: Secondary | ICD-10-CM | POA: Diagnosis not present

## 2023-06-28 DIAGNOSIS — E1140 Type 2 diabetes mellitus with diabetic neuropathy, unspecified: Secondary | ICD-10-CM | POA: Diagnosis not present

## 2023-06-28 DIAGNOSIS — M19011 Primary osteoarthritis, right shoulder: Secondary | ICD-10-CM | POA: Diagnosis not present

## 2023-06-28 DIAGNOSIS — K920 Hematemesis: Secondary | ICD-10-CM | POA: Diagnosis not present

## 2023-06-29 ENCOUNTER — Encounter: Payer: Self-pay | Admitting: Family Medicine

## 2023-07-02 ENCOUNTER — Telehealth: Payer: Self-pay | Admitting: Family Medicine

## 2023-07-02 DIAGNOSIS — E1140 Type 2 diabetes mellitus with diabetic neuropathy, unspecified: Secondary | ICD-10-CM | POA: Diagnosis not present

## 2023-07-02 DIAGNOSIS — F17210 Nicotine dependence, cigarettes, uncomplicated: Secondary | ICD-10-CM | POA: Diagnosis not present

## 2023-07-02 DIAGNOSIS — I129 Hypertensive chronic kidney disease with stage 1 through stage 4 chronic kidney disease, or unspecified chronic kidney disease: Secondary | ICD-10-CM | POA: Diagnosis not present

## 2023-07-02 DIAGNOSIS — S2239XD Fracture of one rib, unspecified side, subsequent encounter for fracture with routine healing: Secondary | ICD-10-CM | POA: Diagnosis not present

## 2023-07-02 DIAGNOSIS — E1122 Type 2 diabetes mellitus with diabetic chronic kidney disease: Secondary | ICD-10-CM | POA: Diagnosis not present

## 2023-07-02 DIAGNOSIS — E785 Hyperlipidemia, unspecified: Secondary | ICD-10-CM | POA: Diagnosis not present

## 2023-07-02 DIAGNOSIS — Z993 Dependence on wheelchair: Secondary | ICD-10-CM

## 2023-07-02 DIAGNOSIS — R296 Repeated falls: Secondary | ICD-10-CM

## 2023-07-02 DIAGNOSIS — R5381 Other malaise: Secondary | ICD-10-CM

## 2023-07-02 DIAGNOSIS — I2510 Atherosclerotic heart disease of native coronary artery without angina pectoris: Secondary | ICD-10-CM | POA: Diagnosis not present

## 2023-07-02 DIAGNOSIS — N1831 Chronic kidney disease, stage 3a: Secondary | ICD-10-CM | POA: Diagnosis not present

## 2023-07-02 DIAGNOSIS — K449 Diaphragmatic hernia without obstruction or gangrene: Secondary | ICD-10-CM | POA: Diagnosis not present

## 2023-07-02 DIAGNOSIS — K222 Esophageal obstruction: Secondary | ICD-10-CM | POA: Diagnosis not present

## 2023-07-02 DIAGNOSIS — K2990 Gastroduodenitis, unspecified, without bleeding: Secondary | ICD-10-CM | POA: Diagnosis not present

## 2023-07-02 DIAGNOSIS — R1310 Dysphagia, unspecified: Secondary | ICD-10-CM | POA: Diagnosis not present

## 2023-07-02 DIAGNOSIS — D62 Acute posthemorrhagic anemia: Secondary | ICD-10-CM | POA: Diagnosis not present

## 2023-07-02 DIAGNOSIS — K920 Hematemesis: Secondary | ICD-10-CM | POA: Diagnosis not present

## 2023-07-02 DIAGNOSIS — K5790 Diverticulosis of intestine, part unspecified, without perforation or abscess without bleeding: Secondary | ICD-10-CM | POA: Diagnosis not present

## 2023-07-02 DIAGNOSIS — M19011 Primary osteoarthritis, right shoulder: Secondary | ICD-10-CM | POA: Diagnosis not present

## 2023-07-02 NOTE — Telephone Encounter (Signed)
Arielle with [facility] called to request an order for a Psychologist, sport and exercise . Arielle can be reached at [phone].

## 2023-07-03 ENCOUNTER — Encounter: Payer: Self-pay | Admitting: Family Medicine

## 2023-07-03 ENCOUNTER — Ambulatory Visit (INDEPENDENT_AMBULATORY_CARE_PROVIDER_SITE_OTHER): Payer: Medicare Other | Admitting: Family Medicine

## 2023-07-03 VITALS — BP 114/72 | HR 51 | Wt 139.6 lb

## 2023-07-03 DIAGNOSIS — E871 Hypo-osmolality and hyponatremia: Secondary | ICD-10-CM

## 2023-07-03 DIAGNOSIS — I4891 Unspecified atrial fibrillation: Secondary | ICD-10-CM

## 2023-07-03 DIAGNOSIS — N1831 Chronic kidney disease, stage 3a: Secondary | ICD-10-CM

## 2023-07-03 DIAGNOSIS — N401 Enlarged prostate with lower urinary tract symptoms: Secondary | ICD-10-CM

## 2023-07-03 DIAGNOSIS — I499 Cardiac arrhythmia, unspecified: Secondary | ICD-10-CM

## 2023-07-03 DIAGNOSIS — R531 Weakness: Secondary | ICD-10-CM

## 2023-07-03 DIAGNOSIS — N138 Other obstructive and reflux uropathy: Secondary | ICD-10-CM

## 2023-07-03 DIAGNOSIS — N3281 Overactive bladder: Secondary | ICD-10-CM

## 2023-07-03 DIAGNOSIS — R5381 Other malaise: Secondary | ICD-10-CM | POA: Diagnosis not present

## 2023-07-03 DIAGNOSIS — R296 Repeated falls: Secondary | ICD-10-CM

## 2023-07-03 DIAGNOSIS — Z993 Dependence on wheelchair: Secondary | ICD-10-CM

## 2023-07-03 MED ORDER — MIRABEGRON ER 25 MG PO TB24: 25.0000 mg | ORAL_TABLET | Freq: Every day | ORAL | 1 refills | Status: DC

## 2023-07-03 NOTE — Patient Instructions (Signed)
Do not take any fluid pills.  I have sent in myrbetriq to help you empty your bladder better.

## 2023-07-03 NOTE — Progress Notes (Signed)
OFFICE VISIT  07/10/2023  CC:  Chief Complaint  Patient presents with   Fall    A few days ago pt fell twice in one day; pt had to call EMS for help to get up. When pt fell it caused scratches and bruising on R arm.      Patient is a 77 y.o. male who presents accompanied by his daughter for recurrent falls at home.  HPI: He is essentially wheelchair and bedbound.  When he tries to get up and walk he usually falls.  No injuries. Additionally, he takes Lasix regularly with the intention to make himself pee more.  He feels like he does not urinate a sufficient volume. He has been taking his wife's Myrbetriq because it has helped him empty his bladder better.  Denies palpitations, chest pain, racing heart, lower extremity swelling, or presyncope. He has a chronic low-level of dizziness that gets worse when he stands up and tries to walk. No dysuria or hematuria.  Past Medical History:  Diagnosis Date   Atherosclerosis of coronary artery    On chest CT->calcif in LAD and RCA   Cerebrovascular disease    03/2023 MRI brain showed chronic microvasc isch changes and small remote infarcts   Chronic low back pain    MR 11/2021 showed L4-5 foraminal stenosis--> plan per Ortho was to do L4-5 injection   Chronic prostatitis    Very mild elevation of PSA (>4), referred to Urol where PSA repeat was 1.0 on 06/16/13.  Annual PSA repeat is all that is needed now per urologist.  Most recent 07/2015 was 0.45.   Chronic renal insufficiency, stage III (moderate) (HCC)    Baseline GFR as of 2019= 50s.     Closed right hip fracture (HCC) 03/2021   THA   Diabetes mellitus with complication (HCC) Dx'd fall 2011   HTN (hypertension)    Renal/aortic doppler u/s normal 06/2010   Hypercalcemia    Hyperlipidemia 2016   Statin intolerant--myalgias.  Pt refuses any further trial of statin as of 2018.   Nonspecific abnormal electrocardiogram (ECG) (EKG) Fall 2011   TWI in inferior leads: myocardial perfusion scan  neg and echo normal 07/2010   Pseudomonas aeruginosa colonization    12/2021 urine clx   TIA (transient ischemic attack)    12/2021- CT angio head/neck no high grade stenosis.  MR showed old lacunar infarcts corona radiata, basal ganglia, thalamus.  Echo normal.   Tobacco dependence    UTI (lower urinary tract infection)    Feb 2012 (klebsiella--dx at [facility]); 03/2013 e coli.     Past Surgical History:  Procedure Laterality Date   BIOPSY  06/16/2023   Procedure: BIOPSY;  Surgeon: Meridee Score Netty Starring., MD;  Location: [facility];  Service: Gastroenterology;;   CARDIOVASCULAR STRESS TEST  07/01/2010   Myocardial perfusion scan neg/low risk.   CATARACT EXTRACTION  2007, 2008   Bilateral ([facility] on [address].   COLONOSCOPY  12/20/04;12/2014   2016 no polyps: recall 5 yrs due to FH of colon ca   ESOPHAGOGASTRODUODENOSCOPY  11/29/2004   esoph stricture/dilation   ESOPHAGOGASTRODUODENOSCOPY N/A 06/16/2023   Procedure: ESOPHAGOGASTRODUODENOSCOPY (EGD);  Surgeon: Lemar Lofty., MD;  Location: [facility];  Service: Gastroenterology;  Laterality: N/A;   SAVORY DILATION N/A 06/16/2023   Procedure: SAVORY DILATION;  Surgeon: Meridee Score Netty Starring., MD;  Location: [facility];  Service: Gastroenterology;  Laterality: N/A;   TOTAL HIP ARTHROPLASTY Right 04/23/2021   Procedure: TOTAL HIP ARTHROPLASTY ANTERIOR APPROACH;  Surgeon:  Cammy Copa, MD;  Location: [facility];  Service: Orthopedics;  Laterality: Right;  Hana, C-arm, Depuy   TRANSTHORACIC ECHOCARDIOGRAM  07/01/2010   2011 EF =>55%; LA mildly dilated; trace MR/TR.  12/2021 EF 60-65%, DD indeterm, valves nl.    Outpatient Medications Prior to Visit  Medication Sig Dispense Refill   acetaminophen (TYLENOL) 500 MG tablet Take 1,000 mg by mouth as needed for moderate pain.     amLODipine (NORVASC) 2.5 MG tablet Take 1 tablet (2.5 mg total) by mouth daily. 90 tablet 1   clopidogrel (PLAVIX) 75 MG tablet Take  1 tablet (75 mg total) by mouth daily. 90 tablet 1   Docusate Calcium (STOOL SOFTENER PO) Take 1 tablet by mouth daily as needed (constipation).     ipratropium (ATROVENT) 0.03 % nasal spray USE 2 SPRAY(S) IN EACH NOSTRIL EVERY 12 HOURS (Patient taking differently: Place 1 spray into both nostrils as needed for rhinitis.) 30 mL 11   OVER THE COUNTER MEDICATION Take 1 tablet by mouth daily. Elderberry, zinc, and vitamin C combo medication OTC.     pantoprazole (PROTONIX) 40 MG tablet Take 1 tablet (40 mg total) by mouth 2 (two) times daily before a meal.     polyethylene glycol (MIRALAX / GLYCOLAX) 17 g packet Take 17 g by mouth daily as needed for mild constipation.     sodium chloride 1 g tablet Take 1 tablet (1 g total) by mouth 2 (two) times daily with a meal. 60 tablet 1   sucralfate (CARAFATE) 1 GM/10ML suspension Take 10 mLs (1 g total) by mouth 4 (four) times daily -  with meals and at bedtime. 420 mL 0   traZODone (DESYREL) 50 MG tablet TAKE 1 TABLET BY MOUTH EVERY DAY AT BEDTIME AS NEEDED FOR SLEEP 90 tablet 1   No facility-administered medications prior to visit.    Allergies  Allergen Reactions   Bactrim [Sulfamethoxazole-Trimethoprim] Nausea Only    Review of Systems  As per HPI  PE:    07/10/2023    2:29 PM 07/10/2023    2:28 PM 07/03/2023    1:08 PM  Vitals with BMI  Weight   139 lbs 10 oz  BMI   22.54  Systolic 160 140 161  Diastolic 123 120 72  Pulse   51     Physical Exam  General: Alert, tired and chronically ill-appearing.  Not acutely ill. He is oriented and attends well. Cardiovascular: Irregularly irregular, rate approximately 90. Extremities: No edema.  LABS:  Last CBC Lab Results  Component Value Date   WBC 6.4 06/16/2023   HGB 14.4 06/16/2023   HCT 42.9 06/16/2023   MCV 87.6 06/16/2023   MCH 29.4 06/16/2023   RDW 14.5 06/16/2023   PLT 262 06/16/2023   Last metabolic panel Lab Results  Component Value Date   GLUCOSE 124 (H) 07/03/2023    NA 131 (L) 07/03/2023   K 3.0 (L) 07/03/2023   CL 91 (L) 07/03/2023   CO2 28 07/03/2023   BUN 10 07/03/2023   CREATININE 1.53 (H) 07/03/2023   GFRNONAA >60 06/18/2023   CALCIUM 9.0 07/03/2023   PHOS 3.7 04/24/2021   PROT 7.6 06/14/2023   ALBUMIN 4.2 06/14/2023   LABGLOB 2.3 02/16/2019   AGRATIO 1.9 02/16/2019   BILITOT 1.2 06/14/2023   ALKPHOS 141 (H) 06/14/2023   AST 19 06/14/2023   ALT 14 06/14/2023   ANIONGAP 9 06/18/2023   Last hemoglobin A1c Lab Results  Component Value Date  HGBA1C 6.2 (A) 02/11/2023   HGBA1C 6.2 02/11/2023   HGBA1C 6.2 02/11/2023   HGBA1C 6.2 02/11/2023   Lab Results  Component Value Date   TSH 2.01 07/03/2023   12 lead EKG today: Atrial fibrillation, rate 97, no ischemic changes. Compared to 03/18/2023 A-fib is new.  IMPRESSION AND PLAN:  #1 generalized weakness, debilitated patient. HH referral to discuss needs/ALF options.  #2 inappropriate Lasix use. Check labs for acute renal failure, hyponatremia, hypokalemia, hypomagnesemia. Once again, reiterated the need to avoid diuretic use at this time. He has no edema.  3. voiding dysfunction. He has a mixture of BPH with overflow incontinence as well as overactive bladder. Myrbetriq ok b/c says helps empty bladder.  #4 new diagnosis atrial fibrillation. Ventricular response is in the 90s. Patient is asymptomatic. Recurrent falls/ongoing high fall risk ----> anticoagulation is contraindicated. BMET, mag, tsh today.  F/u 1 wk  An After Visit Summary was printed and given to the patient.  FOLLOW UP: Return in about 1 week (around 07/10/2023) for f/u debilitation and hyponatremia.  Signed:  Santiago Bumpers, MD           07/10/2023

## 2023-07-03 NOTE — Telephone Encounter (Signed)
Yes, this is good.  Please order.

## 2023-07-04 LAB — BASIC METABOLIC PANEL
BUN: 10 mg/dL (ref 6–23)
CO2: 28 meq/L (ref 19–32)
Calcium: 9 mg/dL (ref 8.4–10.5)
Chloride: 91 meq/L — ABNORMAL LOW (ref 96–112)
Creatinine, Ser: 1.53 mg/dL — ABNORMAL HIGH (ref 0.40–1.50)
GFR: 44.13 mL/min — ABNORMAL LOW (ref >60.00)
Glucose, Bld: 124 mg/dL — ABNORMAL HIGH (ref 70–99)
Potassium: 3 meq/L — ABNORMAL LOW (ref 3.5–5.1)
Sodium: 131 meq/L — ABNORMAL LOW (ref 135–145)

## 2023-07-04 LAB — MAGNESIUM: Magnesium: 1.6 mg/dL (ref 1.5–2.5)

## 2023-07-04 LAB — TSH: TSH: 2.01 u[IU]/mL (ref 0.35–5.50)

## 2023-07-04 NOTE — Telephone Encounter (Signed)
Referral pending, please confirm dx

## 2023-07-04 NOTE — Telephone Encounter (Signed)
Please complete order thur Epic.

## 2023-07-05 ENCOUNTER — Telehealth: Payer: Self-pay | Admitting: *Deleted

## 2023-07-05 NOTE — Telephone Encounter (Signed)
Okay, order completed.

## 2023-07-05 NOTE — Progress Notes (Signed)
Care Coordination   Note   07/05/2023 Name: Hendrik Kor MRN: [id number] DOB: 1946/08/28  Angelito Verdejo is a 77 y.o. year old male who sees [name], [name], MD for primary care. I reached out to [name] by phone today to offer care coordination services.  Mr. Belaire was given information about Care Coordination services today including:   The Care Coordination services include support from the care team which includes your Nurse Coordinator, Clinical Social Worker, or Pharmacist.  The Care Coordination team is here to help remove barriers to the health concerns and goals most important to you. Care Coordination services are voluntary, and the patient may decline or stop services at any time by request to their care team member.   Care Coordination Consent Status: Patient agreed to services and verbal consent obtained.   Follow up plan:  Telephone appointment with care coordination team member scheduled for:  07/09/2023  Encounter Outcome:  Patient Scheduled  Burman Nieves, [facility] Care Coordination Care Guide Direct Dial: [phone]

## 2023-07-06 DIAGNOSIS — I2510 Atherosclerotic heart disease of native coronary artery without angina pectoris: Secondary | ICD-10-CM | POA: Diagnosis not present

## 2023-07-06 DIAGNOSIS — S2239XD Fracture of one rib, unspecified side, subsequent encounter for fracture with routine healing: Secondary | ICD-10-CM | POA: Diagnosis not present

## 2023-07-06 DIAGNOSIS — K449 Diaphragmatic hernia without obstruction or gangrene: Secondary | ICD-10-CM | POA: Diagnosis not present

## 2023-07-06 DIAGNOSIS — E785 Hyperlipidemia, unspecified: Secondary | ICD-10-CM | POA: Diagnosis not present

## 2023-07-06 DIAGNOSIS — K5790 Diverticulosis of intestine, part unspecified, without perforation or abscess without bleeding: Secondary | ICD-10-CM | POA: Diagnosis not present

## 2023-07-06 DIAGNOSIS — D62 Acute posthemorrhagic anemia: Secondary | ICD-10-CM | POA: Diagnosis not present

## 2023-07-06 DIAGNOSIS — R1310 Dysphagia, unspecified: Secondary | ICD-10-CM | POA: Diagnosis not present

## 2023-07-06 DIAGNOSIS — M19011 Primary osteoarthritis, right shoulder: Secondary | ICD-10-CM | POA: Diagnosis not present

## 2023-07-06 DIAGNOSIS — K222 Esophageal obstruction: Secondary | ICD-10-CM | POA: Diagnosis not present

## 2023-07-06 DIAGNOSIS — E1140 Type 2 diabetes mellitus with diabetic neuropathy, unspecified: Secondary | ICD-10-CM | POA: Diagnosis not present

## 2023-07-06 DIAGNOSIS — K920 Hematemesis: Secondary | ICD-10-CM | POA: Diagnosis not present

## 2023-07-06 DIAGNOSIS — N1831 Chronic kidney disease, stage 3a: Secondary | ICD-10-CM | POA: Diagnosis not present

## 2023-07-06 DIAGNOSIS — F17210 Nicotine dependence, cigarettes, uncomplicated: Secondary | ICD-10-CM | POA: Diagnosis not present

## 2023-07-06 DIAGNOSIS — E1122 Type 2 diabetes mellitus with diabetic chronic kidney disease: Secondary | ICD-10-CM | POA: Diagnosis not present

## 2023-07-06 DIAGNOSIS — I129 Hypertensive chronic kidney disease with stage 1 through stage 4 chronic kidney disease, or unspecified chronic kidney disease: Secondary | ICD-10-CM | POA: Diagnosis not present

## 2023-07-06 DIAGNOSIS — K2990 Gastroduodenitis, unspecified, without bleeding: Secondary | ICD-10-CM | POA: Diagnosis not present

## 2023-07-08 DIAGNOSIS — M19011 Primary osteoarthritis, right shoulder: Secondary | ICD-10-CM | POA: Diagnosis not present

## 2023-07-08 DIAGNOSIS — K449 Diaphragmatic hernia without obstruction or gangrene: Secondary | ICD-10-CM | POA: Diagnosis not present

## 2023-07-08 DIAGNOSIS — N1831 Chronic kidney disease, stage 3a: Secondary | ICD-10-CM | POA: Diagnosis not present

## 2023-07-08 DIAGNOSIS — K222 Esophageal obstruction: Secondary | ICD-10-CM | POA: Diagnosis not present

## 2023-07-08 DIAGNOSIS — E1122 Type 2 diabetes mellitus with diabetic chronic kidney disease: Secondary | ICD-10-CM | POA: Diagnosis not present

## 2023-07-08 DIAGNOSIS — E1140 Type 2 diabetes mellitus with diabetic neuropathy, unspecified: Secondary | ICD-10-CM | POA: Diagnosis not present

## 2023-07-08 DIAGNOSIS — I129 Hypertensive chronic kidney disease with stage 1 through stage 4 chronic kidney disease, or unspecified chronic kidney disease: Secondary | ICD-10-CM | POA: Diagnosis not present

## 2023-07-08 DIAGNOSIS — I2510 Atherosclerotic heart disease of native coronary artery without angina pectoris: Secondary | ICD-10-CM | POA: Diagnosis not present

## 2023-07-08 DIAGNOSIS — K2990 Gastroduodenitis, unspecified, without bleeding: Secondary | ICD-10-CM | POA: Diagnosis not present

## 2023-07-08 DIAGNOSIS — K5790 Diverticulosis of intestine, part unspecified, without perforation or abscess without bleeding: Secondary | ICD-10-CM | POA: Diagnosis not present

## 2023-07-08 DIAGNOSIS — F17210 Nicotine dependence, cigarettes, uncomplicated: Secondary | ICD-10-CM | POA: Diagnosis not present

## 2023-07-08 DIAGNOSIS — D62 Acute posthemorrhagic anemia: Secondary | ICD-10-CM | POA: Diagnosis not present

## 2023-07-08 DIAGNOSIS — K920 Hematemesis: Secondary | ICD-10-CM | POA: Diagnosis not present

## 2023-07-08 DIAGNOSIS — R1310 Dysphagia, unspecified: Secondary | ICD-10-CM | POA: Diagnosis not present

## 2023-07-08 DIAGNOSIS — E785 Hyperlipidemia, unspecified: Secondary | ICD-10-CM | POA: Diagnosis not present

## 2023-07-08 DIAGNOSIS — S2239XD Fracture of one rib, unspecified side, subsequent encounter for fracture with routine healing: Secondary | ICD-10-CM | POA: Diagnosis not present

## 2023-07-09 ENCOUNTER — Ambulatory Visit: Payer: Self-pay | Admitting: Licensed Clinical Social Worker

## 2023-07-09 DIAGNOSIS — E1122 Type 2 diabetes mellitus with diabetic chronic kidney disease: Secondary | ICD-10-CM

## 2023-07-09 DIAGNOSIS — I10 Essential (primary) hypertension: Secondary | ICD-10-CM

## 2023-07-09 DIAGNOSIS — N1831 Chronic kidney disease, stage 3a: Secondary | ICD-10-CM

## 2023-07-10 ENCOUNTER — Encounter: Payer: Self-pay | Admitting: Family Medicine

## 2023-07-10 ENCOUNTER — Telehealth (INDEPENDENT_AMBULATORY_CARE_PROVIDER_SITE_OTHER): Payer: Medicare Other | Admitting: Family Medicine

## 2023-07-10 VITALS — BP 160/123

## 2023-07-10 DIAGNOSIS — R5381 Other malaise: Secondary | ICD-10-CM

## 2023-07-10 DIAGNOSIS — I4891 Unspecified atrial fibrillation: Secondary | ICD-10-CM

## 2023-07-10 DIAGNOSIS — N1831 Chronic kidney disease, stage 3a: Secondary | ICD-10-CM

## 2023-07-10 DIAGNOSIS — E876 Hypokalemia: Secondary | ICD-10-CM

## 2023-07-10 DIAGNOSIS — E871 Hypo-osmolality and hyponatremia: Secondary | ICD-10-CM

## 2023-07-10 DIAGNOSIS — N179 Acute kidney failure, unspecified: Secondary | ICD-10-CM

## 2023-07-10 NOTE — Progress Notes (Signed)
Virtual Visit via Video Note  I connected with Antonio Serrano  on 07/10/23 at  2:40 PM EDT by telephone.   Patient did not have a video enabled telemedicine application.  I verified that am speaking with the correct person using two identifiers.  Location patient: [place] Location provider:work or home office Persons participating in the virtual visit: patient, provider  I discussed the limitations and requested verbal permission for telephone visit. The patient expressed understanding and agreed to proceed.  HPI: 77 year old male being seen today for 1 week follow-up debilitation, hyponatremia, acute on chronic renal failure, new diagnosis A-fib, and urinary incontinence.  Last visit I reiterated that he is not to use any more diuretic. Kept him on Myrbetriq. New dx a-fib-->rate was fine.  He is very high fall risk so anticoagulation is contraindicated. His Na and K were low and sCr rose from 0.9 to 1.53.  Emptying bladder fine.  Initially felt like he was not urinating very much but notes that this is improved over the last several days. He drinks about 65 ounces of clear fluids daily. No palpitations, racing heart, chest pain, lower extremity swelling, or presyncope.  He has 2 blood pressure cuffs.  He reports blood pressure today of 140s to 160s over 120s. He reports that sometimes the cuff does not blow up all the way and starts over halfway. Sometimes he gets a blood pressure that is around 115/70. He denies headaches or visual abnormalities.  ROS: See pertinent positives and negatives per HPI.  Past Medical History:  Diagnosis Date   Atherosclerosis of coronary artery    On chest CT->calcif in LAD and RCA   Cerebrovascular disease    03/2023 MRI brain showed chronic microvasc isch changes and small remote infarcts   Chronic low back pain    MR 11/2021 showed L4-5 foraminal stenosis--> plan per Ortho was to do L4-5 injection   Chronic prostatitis    Very mild elevation of PSA (>4),  referred to Urol where PSA repeat was 1.0 on 06/16/13.  Annual PSA repeat is all that is needed now per urologist.  Most recent 07/2015 was 0.45.   Chronic renal insufficiency, stage III (moderate) (HCC)    Baseline GFR as of 2019= 50s.     Closed right hip fracture (HCC) 03/2021   THA   Diabetes mellitus with complication (HCC) Dx'd fall 2011   HTN (hypertension)    Renal/aortic doppler u/s normal 06/2010   Hypercalcemia    Hyperlipidemia 2016   Statin intolerant--myalgias.  Pt refuses any further trial of statin as of 2018.   Nonspecific abnormal electrocardiogram (ECG) (EKG) Fall 2011   TWI in inferior leads: myocardial perfusion scan neg and echo normal 07/2010   Pseudomonas aeruginosa colonization    12/2021 urine clx   TIA (transient ischemic attack)    12/2021- CT angio head/neck no high grade stenosis.  MR showed old lacunar infarcts corona radiata, basal ganglia, thalamus.  Echo normal.   Tobacco dependence    UTI (lower urinary tract infection)    Feb 2012 (klebsiella--dx at [facility]); 03/2013 e coli.     Past Surgical History:  Procedure Laterality Date   BIOPSY  06/16/2023   Procedure: BIOPSY;  Surgeon: Meridee Score Netty Starring., MD;  Location: [facility];  Service: Gastroenterology;;   CARDIOVASCULAR STRESS TEST  07/01/2010   Myocardial perfusion scan neg/low risk.   CATARACT EXTRACTION  2007, 2008   Bilateral ([facility] on [address].   COLONOSCOPY  12/20/04;12/2014   2016 no  polyps: recall 5 yrs due to FH of colon ca   ESOPHAGOGASTRODUODENOSCOPY  11/29/2004   esoph stricture/dilation   ESOPHAGOGASTRODUODENOSCOPY N/A 06/16/2023   Procedure: ESOPHAGOGASTRODUODENOSCOPY (EGD);  Surgeon: Lemar Lofty., MD;  Location: [facility];  Service: Gastroenterology;  Laterality: N/A;   SAVORY DILATION N/A 06/16/2023   Procedure: SAVORY DILATION;  Surgeon: Meridee Score Netty Starring., MD;  Location: [facility];  Service: Gastroenterology;  Laterality: N/A;    TOTAL HIP ARTHROPLASTY Right 04/23/2021   Procedure: TOTAL HIP ARTHROPLASTY ANTERIOR APPROACH;  Surgeon: Cammy Copa, MD;  Location: [facility];  Service: Orthopedics;  Laterality: Right;  Hana, C-arm, Depuy   TRANSTHORACIC ECHOCARDIOGRAM  07/01/2010   2011 EF =>55%; LA mildly dilated; trace MR/TR.  12/2021 EF 60-65%, DD indeterm, valves nl.     Current Outpatient Medications:    acetaminophen (TYLENOL) 500 MG tablet, Take 1,000 mg by mouth as needed for moderate pain., Disp: , Rfl:    amLODipine (NORVASC) 2.5 MG tablet, Take 1 tablet (2.5 mg total) by mouth daily., Disp: 90 tablet, Rfl: 1   clopidogrel (PLAVIX) 75 MG tablet, Take 1 tablet (75 mg total) by mouth daily., Disp: 90 tablet, Rfl: 1   Docusate Calcium (STOOL SOFTENER PO), Take 1 tablet by mouth daily as needed (constipation)., Disp: , Rfl:    ipratropium (ATROVENT) 0.03 % nasal spray, USE 2 SPRAY(S) IN EACH NOSTRIL EVERY 12 HOURS (Patient taking differently: Place 1 spray into both nostrils as needed for rhinitis.), Disp: 30 mL, Rfl: 11   mirabegron ER (MYRBETRIQ) 25 MG TB24 tablet, Take 1 tablet (25 mg total) by mouth daily., Disp: 30 tablet, Rfl: 1   OVER THE COUNTER MEDICATION, Take 1 tablet by mouth daily. Elderberry, zinc, and vitamin C combo medication OTC., Disp: , Rfl:    pantoprazole (PROTONIX) 40 MG tablet, Take 1 tablet (40 mg total) by mouth 2 (two) times daily before a meal., Disp: , Rfl:    polyethylene glycol (MIRALAX / GLYCOLAX) 17 g packet, Take 17 g by mouth daily as needed for mild constipation., Disp: , Rfl:    sodium chloride 1 g tablet, Take 1 tablet (1 g total) by mouth 2 (two) times daily with a meal., Disp: 60 tablet, Rfl: 1   sucralfate (CARAFATE) 1 GM/10ML suspension, Take 10 mLs (1 g total) by mouth 4 (four) times daily -  with meals and at bedtime., Disp: 420 mL, Rfl: 0   traZODone (DESYREL) 50 MG tablet, TAKE 1 TABLET BY MOUTH EVERY DAY AT BEDTIME AS NEEDED FOR SLEEP, Disp: 90 tablet, Rfl:  1  EXAM:  VITALS per patient if applicable:     07/10/2023    2:29 PM 07/10/2023    2:28 PM 07/03/2023    1:08 PM  Vitals with BMI  Weight   139 lbs 10 oz  BMI   22.54  Systolic 160 140 725  Diastolic 123 120 72  Pulse   51  Pt sounds alert, well, lucid. No further exam b/c audio visit only.   LABS: none today   Chemistry      Component Value Date/Time   NA 131 (L) 07/03/2023 1339   NA 126 (A) 02/17/2019 0000   K 3.0 (L) 07/03/2023 1339   CL 91 (L) 07/03/2023 1339   CO2 28 07/03/2023 1339   BUN 10 07/03/2023 1339   BUN 12 02/17/2019 0000   CREATININE 1.53 (H) 07/03/2023 1339   CREATININE 1.87 (H) 05/29/2012 1610   GLU 100 02/17/2019 0000  Component Value Date/Time   CALCIUM 9.0 07/03/2023 1339   ALKPHOS 141 (H) 06/14/2023 1242   AST 19 06/14/2023 1242   ALT 14 06/14/2023 1242   BILITOT 1.2 06/14/2023 1242   BILITOT 0.3 02/16/2019 0953     ASSESSMENT AND PLAN:  Discussed the following assessment and plan:  #1 debilitated patient.  Social worker visited him yesterday and he says that they recommended a hospital bed and a ramp for his home. I have not received any paperwork yet.  2.  Hypertension.  History of orthostatic hypotension.  He has a chronic low level of dizziness that is not positional. He has had hypotension when BP meds titrated up in the past. Unsure of the accuracy/validity of his home blood pressure measurements. For now, continue current regimen of amlodipine 2.5 mg a day. He will continue to monitor and call if consistently greater than 150 on top and greater than 95 on bottom. Permissive hypertension as our goal at this point.  #3 acute kidney injury superimposed on chronic renal insufficiency stage III. He was overusing his Lasix. He does drink an adequate amount of fluids now. He will return within the next week to recheck basic metabolic panel. I believe his hyponatremia and hypokalemia are due to his diuretic use as well.  4.  New  atrial fibrillation, normal ventricular response. This certainly could have been related to the hypokalemia he had at the time of this diagnosis. He is asymptomatic. Anticoagulant contraindicated due to his recurrent falls/ongoing high fall risk.  5.  Urinary incontinence. I think he has a component of BPH with overflow incontinence as well has some overactive bladder symptoms. He seems to be doing well on Myrbetriq alone, 25 mg daily.  Spent 8 min with pt today on telephone reviewing HPI, reviewing relevant past history, doing exam, reviewing and discussing lab and imaging data, and formulating plans.  I discussed the assessment and treatment plan with the patient. The patient was provided an opportunity to ask questions and all were answered. The patient agreed with the plan and demonstrated an understanding of the instructions.   F/U: Within the next week for lab visit to recheck bmet.  Next office visit 1 month.    Signed:  Santiago Bumpers, MD           07/10/2023

## 2023-07-11 ENCOUNTER — Telehealth: Payer: Self-pay | Admitting: Family Medicine

## 2023-07-11 DIAGNOSIS — D62 Acute posthemorrhagic anemia: Secondary | ICD-10-CM | POA: Diagnosis not present

## 2023-07-11 DIAGNOSIS — K920 Hematemesis: Secondary | ICD-10-CM | POA: Diagnosis not present

## 2023-07-11 DIAGNOSIS — E785 Hyperlipidemia, unspecified: Secondary | ICD-10-CM | POA: Diagnosis not present

## 2023-07-11 DIAGNOSIS — E1140 Type 2 diabetes mellitus with diabetic neuropathy, unspecified: Secondary | ICD-10-CM | POA: Diagnosis not present

## 2023-07-11 DIAGNOSIS — F17210 Nicotine dependence, cigarettes, uncomplicated: Secondary | ICD-10-CM | POA: Diagnosis not present

## 2023-07-11 DIAGNOSIS — R1310 Dysphagia, unspecified: Secondary | ICD-10-CM | POA: Diagnosis not present

## 2023-07-11 DIAGNOSIS — K449 Diaphragmatic hernia without obstruction or gangrene: Secondary | ICD-10-CM | POA: Diagnosis not present

## 2023-07-11 DIAGNOSIS — E1122 Type 2 diabetes mellitus with diabetic chronic kidney disease: Secondary | ICD-10-CM | POA: Diagnosis not present

## 2023-07-11 DIAGNOSIS — M19011 Primary osteoarthritis, right shoulder: Secondary | ICD-10-CM | POA: Diagnosis not present

## 2023-07-11 DIAGNOSIS — N1831 Chronic kidney disease, stage 3a: Secondary | ICD-10-CM | POA: Diagnosis not present

## 2023-07-11 DIAGNOSIS — K2990 Gastroduodenitis, unspecified, without bleeding: Secondary | ICD-10-CM | POA: Diagnosis not present

## 2023-07-11 DIAGNOSIS — I2510 Atherosclerotic heart disease of native coronary artery without angina pectoris: Secondary | ICD-10-CM | POA: Diagnosis not present

## 2023-07-11 DIAGNOSIS — S2239XD Fracture of one rib, unspecified side, subsequent encounter for fracture with routine healing: Secondary | ICD-10-CM | POA: Diagnosis not present

## 2023-07-11 DIAGNOSIS — K5790 Diverticulosis of intestine, part unspecified, without perforation or abscess without bleeding: Secondary | ICD-10-CM | POA: Diagnosis not present

## 2023-07-11 DIAGNOSIS — K222 Esophageal obstruction: Secondary | ICD-10-CM | POA: Diagnosis not present

## 2023-07-11 DIAGNOSIS — I129 Hypertensive chronic kidney disease with stage 1 through stage 4 chronic kidney disease, or unspecified chronic kidney disease: Secondary | ICD-10-CM | POA: Diagnosis not present

## 2023-07-11 NOTE — Telephone Encounter (Signed)
LVM for Arielle to return call.   Note: we do not have the phone number for the social worker, only her name Bridgett Larsson, LCSW

## 2023-07-11 NOTE — Patient Instructions (Signed)
Visit Information  Thank you for taking time to visit with me today. Please don't hesitate to contact me if I can be of assistance to you.   Following are the goals we discussed today:   Goals Addressed             This Visit's Progress    Obtain Supportive Resources   On track    Activities and task to complete in order to accomplish goals.   Keep all upcoming appointments discussed today Continue with compliance of taking medication prescribed by Doctor Implement healthy coping skills discussed to assist with management of symptoms         Our next appointment is by telephone on 10/16 at 9:30 AM  Please call the care guide team at [phone] if you need to cancel or reschedule your appointment.   If you are experiencing a Mental Health or Behavioral Health Crisis or need someone to talk to, please call the Suicide and Crisis Lifeline: 988 call 911   The patient verbalized understanding of instructions, educational materials, and care plan provided today and DECLINED offer to receive copy of patient instructions, educational materials, and care plan.   Jenel Lucks, MSW, LCSW [facility] Care Management   [facility] [email or web address] Phone [phone] 6:11 PM

## 2023-07-11 NOTE — Patient Outreach (Signed)
Care Coordination   Initial Visit Note   07/09/2023 Name: Antonio Serrano MRN: [id number] DOB: [birth year]  Antonio Serrano is a 77 y.o. year old male who sees [name], Antonio Morn, MD for primary care. I spoke with  Antonio Serrano and spouse by phone today.  What matters to the patients health and wellness today?  Supportive Resources    Goals Addressed             This Visit's Progress    Obtain Supportive Resources   On track    Activities and task to complete in order to accomplish goals.   Keep all upcoming appointments discussed today Continue with compliance of taking medication prescribed by Doctor Implement healthy coping skills discussed to assist with management of symptoms         SDOH assessments and interventions completed:  No     Care Coordination Interventions:  Yes, provided  Interventions Today    Flowsheet Row Most Recent Value  Chronic Disease   Chronic disease during today's visit Hypertension (HTN), Diabetes  General Interventions   General Interventions Discussed/Reviewed General Interventions Discussed, [facility], Doctor Visits, Communication with, Horticulturist, commercial (DME)  [Patient has HH PT 2 x weekly. Endorses difficulty managing chronic health conditions, financial strain due to medical bills, and mobility concerns in home]  Doctor Visits Discussed/Reviewed Doctor Visits Discussed  Durable Medical Equipment (DME) Antonio Serrano is interested in obtaining a ramp. Family owns home and has five stairs outside, negatively impacting safety while ambulating outside]  Communication with --  [LCSW made care guide referral to assist with home safety]  Mental Health Interventions   Mental Health Discussed/Reviewed Mental Health Discussed, Coping Strategies  [Caregiver Strain endorsed by spouse. Validation and encouragement provided. Strategies to assist with stress management discussed. Daughter visits daily 2-3 hrs]  Nutrition Interventions    Nutrition Discussed/Reviewed Nutrition Discussed  Pharmacy Interventions   Pharmacy Dicussed/Reviewed Pharmacy Topics Discussed, Medication Adherence  Safety Interventions   Safety Discussed/Reviewed Safety Discussed, Fall Risk, Home Safety       Follow up plan: Follow up call scheduled for 2-4 weeks    Encounter Outcome:  Patient Visit Completed   Antonio Serrano, MSW, LCSW [facility] Care Management [facility]  [facility] [email or web address] Phone [phone] 6:09 PM

## 2023-07-11 NOTE — Telephone Encounter (Signed)
Arielle with [facility] called requesting contact information regarding Ship [employer]. She can reached at [phone]. She reports that Kyser was not able to provide the information.

## 2023-07-12 ENCOUNTER — Telehealth: Payer: Self-pay

## 2023-07-12 ENCOUNTER — Encounter (HOSPITAL_COMMUNITY): Payer: Self-pay

## 2023-07-12 ENCOUNTER — Telehealth: Payer: Self-pay | Admitting: Family Medicine

## 2023-07-12 ENCOUNTER — Emergency Department (HOSPITAL_COMMUNITY)
Admission: EM | Admit: 2023-07-12 | Discharge: 2023-07-12 | Disposition: A | Discharge status: Home / Self Care | Payer: Medicare Other

## 2023-07-12 ENCOUNTER — Emergency Department (HOSPITAL_COMMUNITY): Payer: Medicare Other

## 2023-07-12 DIAGNOSIS — R1030 Lower abdominal pain, unspecified: Secondary | ICD-10-CM | POA: Diagnosis not present

## 2023-07-12 DIAGNOSIS — Z79899 Other long term (current) drug therapy: Secondary | ICD-10-CM | POA: Diagnosis not present

## 2023-07-12 DIAGNOSIS — R339 Retention of urine, unspecified: Secondary | ICD-10-CM | POA: Diagnosis not present

## 2023-07-12 DIAGNOSIS — E871 Hypo-osmolality and hyponatremia: Secondary | ICD-10-CM | POA: Diagnosis not present

## 2023-07-12 DIAGNOSIS — Z7902 Long term (current) use of antithrombotics/antiplatelets: Secondary | ICD-10-CM | POA: Diagnosis not present

## 2023-07-12 DIAGNOSIS — Q2733 Arteriovenous malformation of digestive system vessel: Secondary | ICD-10-CM | POA: Diagnosis not present

## 2023-07-12 DIAGNOSIS — I728 Aneurysm of other specified arteries: Secondary | ICD-10-CM | POA: Diagnosis not present

## 2023-07-12 LAB — COMPREHENSIVE METABOLIC PANEL
ALT: 15 U/L (ref 0–44)
AST: 18 U/L (ref 15–41)
Albumin: 3.7 g/dL (ref 3.5–5.0)
Alkaline Phosphatase: 172 U/L — ABNORMAL HIGH (ref 38–126)
Anion gap: 14 (ref 5–15)
BUN: 10 mg/dL (ref 8–23)
CO2: 26 mmol/L (ref 22–32)
Calcium: 8.6 mg/dL — ABNORMAL LOW (ref 8.9–10.3)
Chloride: 89 mmol/L — ABNORMAL LOW (ref 98–111)
Creatinine, Ser: 1.38 mg/dL — ABNORMAL HIGH (ref 0.61–1.24)
GFR, Estimated: 53 mL/min — ABNORMAL LOW (ref >60)
Glucose, Bld: 147 mg/dL — ABNORMAL HIGH (ref 70–99)
Potassium: 2.9 mmol/L — ABNORMAL LOW (ref 3.5–5.1)
Sodium: 129 mmol/L — ABNORMAL LOW (ref 135–145)
Total Bilirubin: 0.6 mg/dL (ref 0.3–1.2)
Total Protein: 7.1 g/dL (ref 6.5–8.1)

## 2023-07-12 LAB — URINALYSIS, ROUTINE W REFLEX MICROSCOPIC
Bilirubin Urine: NEGATIVE
Glucose, UA: 50 mg/dL — AB
Hgb urine dipstick: NEGATIVE
Ketones, ur: NEGATIVE mg/dL
Leukocytes,Ua: NEGATIVE
Nitrite: NEGATIVE
Protein, ur: NEGATIVE mg/dL
Specific Gravity, Urine: 1.004 — ABNORMAL LOW (ref 1.005–1.030)
pH: 7 (ref 5.0–8.0)

## 2023-07-12 LAB — CBC
HCT: 46.1 % (ref 39.0–52.0)
Hemoglobin: 15.9 g/dL (ref 13.0–17.0)
MCH: 29.4 pg (ref 26.0–34.0)
MCHC: 34.5 g/dL (ref 30.0–36.0)
MCV: 85.2 fL (ref 80.0–100.0)
Platelets: 344 10*3/uL (ref 150–400)
RBC: 5.41 MIL/uL (ref 4.22–5.81)
RDW: 14.8 % (ref 11.5–15.5)
WBC: 8.3 10*3/uL (ref 4.0–10.5)
nRBC: 0 % (ref 0.0–0.2)

## 2023-07-12 MED ORDER — IOHEXOL 300 MG/ML  SOLN
100.0000 mL | Freq: Once | INTRAMUSCULAR | Status: AC | PRN
  Administered 2023-07-12: 100 mL via INTRAVENOUS

## 2023-07-12 MED ORDER — POTASSIUM CHLORIDE CRYS ER 20 MEQ PO TBCR
40.0000 meq | EXTENDED_RELEASE_TABLET | Freq: Once | ORAL | Status: AC
  Administered 2023-07-12: 40 meq via ORAL
  Filled 2023-07-12: qty 2

## 2023-07-12 MED ORDER — METOCLOPRAMIDE HCL 5 MG/ML IJ SOLN: 5.0000 mg | Freq: Once | INTRAMUSCULAR | Status: DC

## 2023-07-12 NOTE — ED Notes (Signed)
When discharged call daughter judy at [phone]

## 2023-07-12 NOTE — ED Triage Notes (Signed)
Patient reports he is unable to urinate since this morning around 6am.

## 2023-07-12 NOTE — Discharge Instructions (Addendum)
Please follow-up with your primary doctor.  Please return immediately for fevers, chills, chest pain, shortness of breath, worsening abdominal pain, inability to urinate, or develop any new or worsening symptoms that are concerning to you.  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  We are also giving you the number to urology.  Please call them and follow-up regarding your urinary symptoms.

## 2023-07-12 NOTE — Telephone Encounter (Signed)
[facility] faxed plan of care, to be filled out by provider. Patient requested to send it back via Fax within ASAP. Document is located in providers tray at front office.Please advise at [facility] [phone]

## 2023-07-12 NOTE — ED Provider Notes (Signed)
[place] EMERGENCY DEPARTMENT AT [facility] Provider Note   CSN: [id number] Arrival date & time: 07/12/23  1126     History  Chief Complaint  Patient presents with   Urinary Retention    Last Urinated this morning at [facility] Thum is a 77 y.o. male.  This is a 77 year old male present emergency department for lower abdominal pain difficulty urinating.  Reports he has some chronic BPH and states he feels like he cannot pee.  Last urinated at 6 AM this morning after he took some Lasix.  Since then he has had worsening lower abdominal pain.  No nausea no vomiting.  Normal bowel movements.        Home Medications Prior to Admission medications   Medication Sig Start Date End Date Taking? Authorizing Provider  acetaminophen (TYLENOL) 500 MG tablet Take 1,000 mg by mouth as needed for moderate pain.    [provider]  amLODipine (NORVASC) 2.5 MG tablet Take 1 tablet (2.5 mg total) by mouth daily. 06/10/23   [name], [name], MD  clopidogrel (PLAVIX) 75 MG tablet Take 1 tablet (75 mg total) by mouth daily. 05/14/23   [name], [name], MD  Docusate Calcium (STOOL SOFTENER PO) Take 1 tablet by mouth daily as needed (constipation).    [provider]  ipratropium (ATROVENT) 0.03 % nasal spray USE 2 SPRAY(S) IN EACH NOSTRIL EVERY 12 HOURS Patient taking differently: Place 1 spray into both nostrils as needed for rhinitis. 09/13/21   [name], [name], MD  mirabegron ER (MYRBETRIQ) 25 MG TB24 tablet Take 1 tablet (25 mg total) by mouth daily. 07/03/23   [name], [name], MD  OVER THE COUNTER MEDICATION Take 1 tablet by mouth daily. Elderberry, zinc, and vitamin C combo medication OTC.    [provider]  pantoprazole (PROTONIX) 40 MG tablet Take 1 tablet (40 mg total) by mouth 2 (two) times daily before a meal. 06/19/23   Marinda Elk, MD  polyethylene glycol (MIRALAX / GLYCOLAX) 17 g packet Take 17 g by mouth daily as needed for mild  constipation. 04/23/23   Pokhrel, Rebekah [place], MD  sodium chloride 1 g tablet Take 1 tablet (1 g total) by mouth 2 (two) times daily with a meal. 05/14/23   [name], [name], MD  sucralfate (CARAFATE) 1 GM/10ML suspension Take 10 mLs (1 g total) by mouth 4 (four) times daily -  with meals and at bedtime. 06/19/23 07/19/23  Marinda Elk, MD  traZODone (DESYREL) 50 MG tablet TAKE 1 TABLET BY MOUTH EVERY DAY AT BEDTIME AS NEEDED FOR SLEEP 05/14/23   [name], [name], MD      Allergies    Bactrim [sulfamethoxazole-trimethoprim]    Review of Systems   Review of Systems  Physical Exam Updated Vital Signs BP 123/62   Pulse 78   Resp 18   SpO2 100%  Physical Exam Vitals and nursing note reviewed.  Constitutional:      General: He is not in acute distress.    Appearance: He is not toxic-appearing.  HENT:     Head: Normocephalic.     Nose: Nose normal.     Mouth/Throat:     Mouth: Mucous membranes are moist.  Eyes:     Conjunctiva/sclera: Conjunctivae normal.  Cardiovascular:     Rate and Rhythm: Normal rate and regular rhythm.  Pulmonary:     Effort: Pulmonary effort is normal.  Abdominal:     General: Abdomen is flat.  There is no distension.     Palpations: Abdomen is soft.     Tenderness: There is abdominal tenderness (lower quadrants). There is no guarding or rebound.  Musculoskeletal:     Right lower leg: No edema.     Left lower leg: No edema.  Skin:    General: Skin is warm and dry.  Neurological:     Mental Status: He is alert and oriented to person, place, and time.  Psychiatric:        Mood and Affect: Mood normal.        Behavior: Behavior normal.     ED Results / Procedures / Treatments   Labs (all labs ordered are listed, but only abnormal results are displayed) Labs Reviewed  URINALYSIS, ROUTINE W REFLEX MICROSCOPIC - Abnormal; Notable for the following components:      Result Value   Color, Urine STRAW (*)    Specific Gravity, Urine 1.004 (*)     Glucose, UA 50 (*)    All other components within normal limits  COMPREHENSIVE METABOLIC PANEL - Abnormal; Notable for the following components:   Sodium 129 (*)    Potassium 2.9 (*)    Chloride 89 (*)    Glucose, Bld 147 (*)    Creatinine, Ser 1.38 (*)    Calcium 8.6 (*)    Alkaline Phosphatase 172 (*)    GFR, Estimated 53 (*)    All other components within normal limits  CBC    EKG None  Radiology No results found.  Procedures Procedures    Medications Ordered in ED Medications  iohexol (OMNIPAQUE) 300 MG/ML solution 100 mL (100 mLs Intravenous Contrast Given 07/12/23 1341)  potassium chloride SA (KLOR-CON M) CR tablet 40 mEq (40 mEq Oral Given 07/12/23 1421)    ED Course/ Medical Decision Making/ A&P Clinical Course as of 07/17/23 0710  Fri Jul 12, 2023  1148 Per pcp note: HPI: He is essentially wheelchair and bedbound.  When he tries to get up and walk he usually falls.  No injuries. Additionally, he takes Lasix regularly with the intention to make himself pee more.  He feels like he does not urinate a sufficient volume. He has been taking his wife's Myrbetriq because it has helped him empty his bladder better.   IMPRESSION AND PLAN:   #1 generalized weakness, debilitated patient. HH referral to discuss needs/ALF options.   #2 inappropriate Lasix use. Check labs for acute renal failure, hyponatremia, hypokalemia, hypomagnesemia. Once again, reiterated the need to avoid diuretic use at this time. He has no edema.   3. voiding dysfunction. He has a mixture of BPH with overflow incontinence as well as overactive bladder. Myrbetriq ok b/c says helps empty bladder." [TY]  1324 Comprehensive metabolic panel(!) Chronic hyponatremia; appears to bear near baseline. Potassium low; replete.  Appears to have elevated cr, similar to baseline. No elevation in AST/ALT that would explain lower abd pain.  [TY]  1614 Reevaluated, patient feeling improved.  Labs showed chronic  hyponatremia, hypokalemia.  Potassium was repleted.  No leukocytosis to suggest systemic infection.  Urine without infection.  No significant urinary retention on bladder scan with 100 mL of fluid.  He provided urine sample, and has urine in bedside urinal.  CT scan showed incidental saccular aneurysm, but no other acute pathology.  Discussed follow-up with vascular surgery.  Patient was quite concerned about his urinary retention difficulty urinating.  Will give him referral to urology.  He is stable for discharge at this time.  He was counseled to stop taking Lasix and follow-up with his primary doctor. [TY]    Clinical Course User Index [TY] Coral Spikes, DO                                 Medical Decision Making 52 male present emergency department with lower abdominal pain in the setting of BPH.  Vital signs reassuring.  Did have some mild tenderness in lower quadrants.  Patient concerned of obstruction; however bladder scan with roughly 100 mL of urine.  Given patient's tenderness diffusely across suprapubic region and lower quadrants will get labs and CT scan to evaluate for alternative cause of his abdominal pain.  See ED course for further MDM disposition.  Amount and/or Complexity of Data Reviewed External Data Reviewed:     Details: See ED course Labs: ordered. Decision-making details documented in ED Course. Radiology: ordered. Decision-making details documented in ED Course. ECG/medicine tests: ordered.  Risk Prescription drug management.          Final Clinical Impression(s) / ED Diagnoses Final diagnoses:  Lower abdominal pain  Aneurysm artery, celiac (HCC)  Chronic hyponatremia    Rx / DC Orders ED Discharge Orders     None         Coral Spikes, DO 07/17/23 0710

## 2023-07-12 NOTE — Telephone Encounter (Signed)
Telephone encounter was:  Successful.  07/12/2023 Name: Antonio Serrano MRN: [id number] DOB: 06-Oct-1945  Antonio Serrano is a 77 y.o. year old male who is a primary care patient of [name], [name], MD . The community resource team was consulted for assistance with Home Modifications  Care guide performed the following interventions: Patient provided with information about care guide support team and interviewed to confirm resource needs.Patient requested I mail the resources. I will be sending [facility] resources and [facility] resources to him   Follow Up Plan:  No further follow up planned at this time. The patient has been provided with needed resources.    Lenard Forth Organ  [facility] Guide, Phone: [phone] Website: [email or web address]

## 2023-07-12 NOTE — ED Provider Triage Note (Signed)
Emergency Medicine Provider Triage Evaluation Note  Antonio Serrano , a 77 y.o. male  was evaluated in triage.  Pt complains of difficulty urinating.  Pt reports taking a fluid pill but now not urinating  Review of Systems  Positive: discomfort Negative: fever  Physical Exam  There were no vitals taken for this visit. Gen:   Awake, no distress   Resp:  Normal effort  MSK:   Moves extremities without difficulty  Other:    Medical Decision Making  Medically screening exam initiated at 11:38 AM.  Appropriate orders placed.  Antonio Serrano was informed that the remainder of the evaluation will be completed by another provider, this initial triage assessment does not replace that evaluation, and the importance of remaining in the ED until their evaluation is complete.     Elson Areas, [place] 07/12/23 1139

## 2023-07-15 ENCOUNTER — Other Ambulatory Visit: Payer: Medicare Other

## 2023-07-15 ENCOUNTER — Telehealth: Payer: Self-pay

## 2023-07-15 NOTE — Telephone Encounter (Signed)
Home health orders received 07/12/23 for [facility] health initiation orders: Yes.  Home health re-certification orders: No. Patient last seen by ordering physician for this condition: 07/12/23. Must be less than 90 days for re-certification and less than 30 days prior for initiation. Visit must have been for the condition the orders are being placed.  Patient meets criteria for Physician to sign orders: Yes.        Current med list has been attached: Yes        Orders placed on physicians desk for signature: 07/15/23 (date) If patient does not meet criteria for orders to be signed: pt was called to schedule appt. Appt is scheduled for 07/16/23.    Placed on PCP desk to review and sign, if appropriate.  Pollie Meyer

## 2023-07-15 NOTE — Telephone Encounter (Signed)
Signed and put in box to go up front. Signed:  Santiago Bumpers, MD           07/15/2023

## 2023-07-15 NOTE — Telephone Encounter (Signed)
Patient was seen in ED on 10/11.  Discharge notes and AVS states patient needs referral to [facility]     Reason for Referral Request:  Urology & Vascular Surgeon  Has patient been seen PCP for this complaint?  Patient was seen in ED 10/11, Patient has appt tomorrow with Dr. Milinda Cave 10/15  No,  please schedule patient for appointment for complaint.  Patient scheduled on:  10/15  Yes, please find out following information.  Referral for which specialty: Urology & Vascular Surgeon  Preferred office/provider: [facility]

## 2023-07-15 NOTE — Telephone Encounter (Signed)
Please Advise

## 2023-07-15 NOTE — Telephone Encounter (Signed)
Jenel Lucks, MSW, LCSW [facility] Care Management [place]  [facility] [email or web address] Phone [phone]

## 2023-07-15 NOTE — Patient Instructions (Signed)

## 2023-07-16 ENCOUNTER — Ambulatory Visit: Payer: Medicare Other | Admitting: Family Medicine

## 2023-07-16 DIAGNOSIS — E876 Hypokalemia: Secondary | ICD-10-CM

## 2023-07-16 DIAGNOSIS — N1831 Chronic kidney disease, stage 3a: Secondary | ICD-10-CM

## 2023-07-16 DIAGNOSIS — N179 Acute kidney failure, unspecified: Secondary | ICD-10-CM

## 2023-07-16 DIAGNOSIS — E871 Hypo-osmolality and hyponatremia: Secondary | ICD-10-CM

## 2023-07-16 DIAGNOSIS — I4891 Unspecified atrial fibrillation: Secondary | ICD-10-CM

## 2023-07-16 NOTE — Progress Notes (Signed)
 No show

## 2023-07-17 ENCOUNTER — Telehealth: Payer: Self-pay | Admitting: Family Medicine

## 2023-07-17 ENCOUNTER — Ambulatory Visit: Payer: Self-pay | Admitting: Licensed Clinical Social Worker

## 2023-07-17 DIAGNOSIS — K2990 Gastroduodenitis, unspecified, without bleeding: Secondary | ICD-10-CM | POA: Diagnosis not present

## 2023-07-17 DIAGNOSIS — R1310 Dysphagia, unspecified: Secondary | ICD-10-CM | POA: Diagnosis not present

## 2023-07-17 DIAGNOSIS — N1831 Chronic kidney disease, stage 3a: Secondary | ICD-10-CM | POA: Diagnosis not present

## 2023-07-17 DIAGNOSIS — F17210 Nicotine dependence, cigarettes, uncomplicated: Secondary | ICD-10-CM | POA: Diagnosis not present

## 2023-07-17 DIAGNOSIS — S2239XD Fracture of one rib, unspecified side, subsequent encounter for fracture with routine healing: Secondary | ICD-10-CM | POA: Diagnosis not present

## 2023-07-17 DIAGNOSIS — I129 Hypertensive chronic kidney disease with stage 1 through stage 4 chronic kidney disease, or unspecified chronic kidney disease: Secondary | ICD-10-CM | POA: Diagnosis not present

## 2023-07-17 DIAGNOSIS — K222 Esophageal obstruction: Secondary | ICD-10-CM | POA: Diagnosis not present

## 2023-07-17 DIAGNOSIS — K5790 Diverticulosis of intestine, part unspecified, without perforation or abscess without bleeding: Secondary | ICD-10-CM | POA: Diagnosis not present

## 2023-07-17 DIAGNOSIS — E1122 Type 2 diabetes mellitus with diabetic chronic kidney disease: Secondary | ICD-10-CM | POA: Diagnosis not present

## 2023-07-17 DIAGNOSIS — K920 Hematemesis: Secondary | ICD-10-CM | POA: Diagnosis not present

## 2023-07-17 DIAGNOSIS — M19011 Primary osteoarthritis, right shoulder: Secondary | ICD-10-CM | POA: Diagnosis not present

## 2023-07-17 DIAGNOSIS — E1140 Type 2 diabetes mellitus with diabetic neuropathy, unspecified: Secondary | ICD-10-CM | POA: Diagnosis not present

## 2023-07-17 DIAGNOSIS — D62 Acute posthemorrhagic anemia: Secondary | ICD-10-CM | POA: Diagnosis not present

## 2023-07-17 DIAGNOSIS — K449 Diaphragmatic hernia without obstruction or gangrene: Secondary | ICD-10-CM | POA: Diagnosis not present

## 2023-07-17 DIAGNOSIS — I2510 Atherosclerotic heart disease of native coronary artery without angina pectoris: Secondary | ICD-10-CM | POA: Diagnosis not present

## 2023-07-17 DIAGNOSIS — E785 Hyperlipidemia, unspecified: Secondary | ICD-10-CM | POA: Diagnosis not present

## 2023-07-17 NOTE — Telephone Encounter (Signed)
Toniann Fail advised.

## 2023-07-17 NOTE — Telephone Encounter (Signed)
Yes, okay.

## 2023-07-17 NOTE — Telephone Encounter (Signed)
Toniann Fail with [facility] called to request verbal orders for Antonio Serrano to have an ASO on the right lower extremity. Please give Toniann Fail a call at [phone]

## 2023-07-17 NOTE — Telephone Encounter (Signed)
Toniann Fail with [facility] is calling to request a written RX for AFO for [facility]. She is requesting Demo and clinical notes. She can be reached at [phone]

## 2023-07-17 NOTE — Telephone Encounter (Signed)
Is this ok?

## 2023-07-18 ENCOUNTER — Encounter: Payer: Self-pay | Admitting: Licensed Clinical Social Worker

## 2023-07-18 NOTE — Patient Outreach (Signed)
Care Coordination   Multidisciplinary Case Review Note    07/18/2023 Name: Antonio Serrano MRN: [id number] DOB: 04/10/1946  Antonio Serrano is a 77 y.o. year old male who sees [name], [name], MD for primary care.  The  multidisciplinary care team met today to review patient care needs and barriers.    Care Coordination Interventions: Multidisciplinary case discussion to review patient ongoing care coordination needs  Patient to be engaged ongoing by LCSW Jenel Lucks, who will also refer to [facility] to address disease management and social support needs   SDOH assessments and interventions completed:  No     Care Coordination Interventions Activated:  Yes   Care Coordination Interventions:  Yes, provided   Follow up plan:  LCSW will f/up with family to provide supportive resources discussed in case review    Multidisciplinary Team Attendees:   Antionette Fairy, [facility] RNCM Dionne Longton, RNCM [place], [place] Jenel Lucks, [place]  Scribe for Multidisciplinary Case Review:   Jenel Lucks, MSW, LCSW [facility] Care Management [facility] Health  [facility] [email or web address] Phone [phone] 6:37 PM

## 2023-07-18 NOTE — Patient Outreach (Signed)
Care Coordination   Follow Up Visit Note   07/17/2023 Name: Antonio Serrano MRN: [id number] DOB: 1946-09-27  Drayson Latimer is a 77 y.o. year old male who sees [name], [name], MD for primary care. I spoke with  Curlie Begley's spouse by phone today.  What matters to the patients health and wellness today?  Supportive Resources    Goals Addressed             This Visit's Progress    Obtain Supportive Resources   On track    Activities and task to complete in order to accomplish goals.   Keep all upcoming appointments discussed today Continue with compliance of taking medication prescribed by Doctor Implement healthy coping skills discussed to assist with management of symptoms F/up with [facility] as needed         SDOH assessments and interventions completed:  No     Care Coordination Interventions:  Yes, provided  Interventions Today    Flowsheet Row Most Recent Value  Chronic Disease   Chronic disease during today's visit Hypertension (HTN), Diabetes  General Interventions   General Interventions Discussed/Reviewed General Interventions Reviewed, Doctor Visits, Durable Medical Equipment (DME)  [Pt was recently seen in ED and completed HFU appt with PCP. HH has recommended hospital bed,  however, family is unsure of status. LCSW encouraged them to f/up with [facility] team]  Doctor Visits Discussed/Reviewed Doctor Visits Reviewed  Durable Medical Equipment (DME) Other  [Hospital bed]  Mental Health Interventions   Mental Health Discussed/Reviewed Mental Health Reviewed, Coping Strategies, Anxiety       Follow up plan: Follow up call scheduled for 1-2 weeks    Encounter Outcome:  Patient Visit Completed   Jenel Lucks, MSW, LCSW [facility] Care Management [facility] Health  [facility] [email or web address] Phone [phone] 9:15 AM

## 2023-07-18 NOTE — Patient Instructions (Signed)
Visit Information  Thank you for taking time to visit with me today. Please don't hesitate to contact me if I can be of assistance to you.   Following are the goals we discussed today:   Goals Addressed             This Visit's Progress    Obtain Supportive Resources   On track    Activities and task to complete in order to accomplish goals.   Keep all upcoming appointments discussed today Continue with compliance of taking medication prescribed by Doctor Implement healthy coping skills discussed to assist with management of symptoms F/up with [facility] as needed         Please call the care guide team at [phone] if you need to cancel or reschedule your appointment.   If you are experiencing a Mental Health or Behavioral Health Crisis or need someone to talk to, please call the Suicide and Crisis Lifeline: 988 call 911   The patient verbalized understanding of instructions, educational materials, and care plan provided today and DECLINED offer to receive copy of patient instructions, educational materials, and care plan.   Jenel Lucks, MSW, LCSW [facility] Care Management [place]  [facility] [email or web address] Phone [phone] 9:15 AM

## 2023-07-20 DIAGNOSIS — F17210 Nicotine dependence, cigarettes, uncomplicated: Secondary | ICD-10-CM | POA: Diagnosis not present

## 2023-07-20 DIAGNOSIS — R1310 Dysphagia, unspecified: Secondary | ICD-10-CM | POA: Diagnosis not present

## 2023-07-20 DIAGNOSIS — D62 Acute posthemorrhagic anemia: Secondary | ICD-10-CM | POA: Diagnosis not present

## 2023-07-20 DIAGNOSIS — E1122 Type 2 diabetes mellitus with diabetic chronic kidney disease: Secondary | ICD-10-CM | POA: Diagnosis not present

## 2023-07-20 DIAGNOSIS — Z604 Social exclusion and rejection: Secondary | ICD-10-CM | POA: Diagnosis not present

## 2023-07-20 DIAGNOSIS — K222 Esophageal obstruction: Secondary | ICD-10-CM | POA: Diagnosis not present

## 2023-07-20 DIAGNOSIS — K5790 Diverticulosis of intestine, part unspecified, without perforation or abscess without bleeding: Secondary | ICD-10-CM | POA: Diagnosis not present

## 2023-07-20 DIAGNOSIS — Z9181 History of falling: Secondary | ICD-10-CM | POA: Diagnosis not present

## 2023-07-20 DIAGNOSIS — Z87440 Personal history of urinary (tract) infections: Secondary | ICD-10-CM | POA: Diagnosis not present

## 2023-07-20 DIAGNOSIS — E785 Hyperlipidemia, unspecified: Secondary | ICD-10-CM | POA: Diagnosis not present

## 2023-07-20 DIAGNOSIS — E1140 Type 2 diabetes mellitus with diabetic neuropathy, unspecified: Secondary | ICD-10-CM | POA: Diagnosis not present

## 2023-07-20 DIAGNOSIS — I2510 Atherosclerotic heart disease of native coronary artery without angina pectoris: Secondary | ICD-10-CM | POA: Diagnosis not present

## 2023-07-20 DIAGNOSIS — Z96641 Presence of right artificial hip joint: Secondary | ICD-10-CM | POA: Diagnosis not present

## 2023-07-20 DIAGNOSIS — Z7902 Long term (current) use of antithrombotics/antiplatelets: Secondary | ICD-10-CM | POA: Diagnosis not present

## 2023-07-20 DIAGNOSIS — M19011 Primary osteoarthritis, right shoulder: Secondary | ICD-10-CM | POA: Diagnosis not present

## 2023-07-20 DIAGNOSIS — M48061 Spinal stenosis, lumbar region without neurogenic claudication: Secondary | ICD-10-CM | POA: Diagnosis not present

## 2023-07-20 DIAGNOSIS — K449 Diaphragmatic hernia without obstruction or gangrene: Secondary | ICD-10-CM | POA: Diagnosis not present

## 2023-07-20 DIAGNOSIS — N1831 Chronic kidney disease, stage 3a: Secondary | ICD-10-CM | POA: Diagnosis not present

## 2023-07-20 DIAGNOSIS — Z8673 Personal history of transient ischemic attack (TIA), and cerebral infarction without residual deficits: Secondary | ICD-10-CM | POA: Diagnosis not present

## 2023-07-20 DIAGNOSIS — I129 Hypertensive chronic kidney disease with stage 1 through stage 4 chronic kidney disease, or unspecified chronic kidney disease: Secondary | ICD-10-CM | POA: Diagnosis not present

## 2023-07-20 DIAGNOSIS — S2239XD Fracture of one rib, unspecified side, subsequent encounter for fracture with routine healing: Secondary | ICD-10-CM | POA: Diagnosis not present

## 2023-07-20 DIAGNOSIS — K920 Hematemesis: Secondary | ICD-10-CM | POA: Diagnosis not present

## 2023-07-20 DIAGNOSIS — K2990 Gastroduodenitis, unspecified, without bleeding: Secondary | ICD-10-CM | POA: Diagnosis not present

## 2023-07-22 NOTE — Telephone Encounter (Signed)
Patient daughter states home health does not have ASO orders for [facility].  Please advise.

## 2023-07-22 NOTE — Telephone Encounter (Signed)
Please review and advise.

## 2023-07-23 ENCOUNTER — Telehealth: Payer: Self-pay | Admitting: Family Medicine

## 2023-07-23 NOTE — Telephone Encounter (Signed)
Please advise 

## 2023-07-23 NOTE — Telephone Encounter (Signed)
Antonio Serrano daughter called to report that Antonio Serrano continues to take his wife's medication (Lasix) She reports that on Thursday he took her pills and was laid out on the floor while they were gone to vote. She reports that she did not seek medical attention for Antonio Serrano at the time. He was instructed by Antonio Serrano to not take this medication and continues to do so because he has trouble going to the bathroom and believes "fluid pills" will help. She reports that she has since been keeping her mothers medication at her home. She ask that a CMA give Antonio Serrano her mother a call since she is not on the [place]. Antonio Serrano can be reached at [phone]

## 2023-07-25 ENCOUNTER — Encounter: Payer: Self-pay | Admitting: Licensed Clinical Social Worker

## 2023-07-25 DIAGNOSIS — K2990 Gastroduodenitis, unspecified, without bleeding: Secondary | ICD-10-CM | POA: Diagnosis not present

## 2023-07-25 DIAGNOSIS — I129 Hypertensive chronic kidney disease with stage 1 through stage 4 chronic kidney disease, or unspecified chronic kidney disease: Secondary | ICD-10-CM | POA: Diagnosis not present

## 2023-07-25 DIAGNOSIS — K920 Hematemesis: Secondary | ICD-10-CM | POA: Diagnosis not present

## 2023-07-25 DIAGNOSIS — M19011 Primary osteoarthritis, right shoulder: Secondary | ICD-10-CM | POA: Diagnosis not present

## 2023-07-25 DIAGNOSIS — D62 Acute posthemorrhagic anemia: Secondary | ICD-10-CM | POA: Diagnosis not present

## 2023-07-25 DIAGNOSIS — E785 Hyperlipidemia, unspecified: Secondary | ICD-10-CM | POA: Diagnosis not present

## 2023-07-25 DIAGNOSIS — F17210 Nicotine dependence, cigarettes, uncomplicated: Secondary | ICD-10-CM | POA: Diagnosis not present

## 2023-07-25 DIAGNOSIS — I2510 Atherosclerotic heart disease of native coronary artery without angina pectoris: Secondary | ICD-10-CM | POA: Diagnosis not present

## 2023-07-25 DIAGNOSIS — R1310 Dysphagia, unspecified: Secondary | ICD-10-CM | POA: Diagnosis not present

## 2023-07-25 DIAGNOSIS — K449 Diaphragmatic hernia without obstruction or gangrene: Secondary | ICD-10-CM | POA: Diagnosis not present

## 2023-07-25 DIAGNOSIS — K5790 Diverticulosis of intestine, part unspecified, without perforation or abscess without bleeding: Secondary | ICD-10-CM | POA: Diagnosis not present

## 2023-07-25 DIAGNOSIS — E1140 Type 2 diabetes mellitus with diabetic neuropathy, unspecified: Secondary | ICD-10-CM | POA: Diagnosis not present

## 2023-07-25 DIAGNOSIS — K222 Esophageal obstruction: Secondary | ICD-10-CM | POA: Diagnosis not present

## 2023-07-25 DIAGNOSIS — S2239XD Fracture of one rib, unspecified side, subsequent encounter for fracture with routine healing: Secondary | ICD-10-CM | POA: Diagnosis not present

## 2023-07-25 DIAGNOSIS — N1831 Chronic kidney disease, stage 3a: Secondary | ICD-10-CM | POA: Diagnosis not present

## 2023-07-25 DIAGNOSIS — E1122 Type 2 diabetes mellitus with diabetic chronic kidney disease: Secondary | ICD-10-CM | POA: Diagnosis not present

## 2023-07-25 NOTE — Patient Outreach (Signed)
Care Coordination   Collaborative  Visit Note   07/25/2023 Name: Antonio Serrano MRN: [id number] DOB: December 17, 1945  Eliya Blankman is a 77 y.o. year old male who sees [name], [name], MD for primary care. I  engaged with [facility] PT  What matters to the patients health and wellness today?  Patient was not engaged during this encounter    Goals Addressed             This Visit's Progress    Obtain Supportive Resources   On track    Activities and task to complete in order to accomplish goals.   Keep all upcoming appointments discussed today Continue with compliance of taking medication prescribed by Doctor Implement healthy coping skills discussed to assist with management of symptoms F/up with [facility] as needed         SDOH assessments and interventions completed:  No     Care Coordination Interventions:  Yes, provided  Interventions Today    Flowsheet Row Most Recent Value  Chronic Disease   Chronic disease during today's visit Hypertension (HTN), Diabetes  General Interventions   General Interventions Discussed/Reviewed Communication with, Level of Care  Communication with --  [LCSW collaborated with Chesley Noon, Physical Therapist with [facility]. Concerns for pt's mobility and safety in the home discussed. Strategies discussed to assist with strenghtening support for family. Ms. Claris Che has notified PCP of concerns]  Level of Care Assisted Living  Safety Interventions   Safety Discussed/Reviewed Safety Discussed, Fall Risk, Home Safety       Follow up plan: Follow up call scheduled for 1 week    Encounter Outcome:  Patient Visit Completed   Jenel Lucks, MSW, LCSW [facility] Care Management [facility] Health  [facility] [email or web address] Phone [phone] 5:39 PM

## 2023-07-26 NOTE — Telephone Encounter (Signed)
I called pt. Plan is for him to come in 10/28 for bmet.

## 2023-07-30 ENCOUNTER — Telehealth: Payer: Self-pay

## 2023-07-30 NOTE — Patient Outreach (Signed)
Care Coordination   Initial Visit Note   07/30/2023 Name: Antonio Serrano MRN: [id number] DOB: 31-Dec-1945  Antonio Serrano is a 77 y.o. year old male who sees [name], [name], MD for primary care. I  spoke with spouse [place] by phone today  What matters to the patients health and wellness today?  Maintain health    Goals Addressed             This Visit's Progress    Health Maintenance-HTN, DM       Patient Goals/Self Care Activities: -Patient/Caregiver will take medications as prescribed   -Patient/Caregiver will attend all scheduled provider appointments -Patient/Caregiver will call provider office for new concerns or questions   -Calls provider office for new concerns, questions, or BP outside discussed parameters -Checks BP and records as discussed -Follows a low sodium diet/DASH diet -check blood sugar at prescribed times -record values and write them down take them to all doctor visits     Spoke with spouse [place].  She reports that patient not doing as well that he is sleeping most of the day.  Patient sits in the recliner all day and night as wife reports she cannot get him up and daughter only in the home a couple of hours as day.  He has HH PT from [facility].  Wife reports daughter assists with person car and patient able to take his medications.  Patient diet controlled diabetic.  Daughter checks sugars but not regularly per spouse.  Discussed diabetes and management.        SDOH assessments and interventions completed:  Yes  SDOH Interventions Today    Flowsheet Row Most Recent Value  SDOH Interventions   Food Insecurity Interventions Intervention Not Indicated  Housing Interventions Intervention Not Indicated  Transportation Interventions Intervention Not Indicated        Care Coordination Interventions:  Yes, provided   Follow up plan: Follow up call scheduled for November    Encounter Outcome:  Patient Visit Completed   Bary Leriche, RN,  MSN [place]  [facility], [facility] Management Community Coordinator Direct Dial: [phone]  Fax: [phone] Website: [email or web address]

## 2023-07-30 NOTE — Patient Instructions (Signed)
Visit Information  Thank you for taking time to visit with me today. Please don't hesitate to contact me if I can be of assistance to you.   Following are the goals we discussed today:   Goals Addressed             This Visit's Progress    Health Maintenance-HTN, DM       Patient Goals/Self Care Activities: -Patient/Caregiver will take medications as prescribed   -Patient/Caregiver will attend all scheduled provider appointments -Patient/Caregiver will call provider office for new concerns or questions   -Calls provider office for new concerns, questions, or BP outside discussed parameters -Checks BP and records as discussed -Follows a low sodium diet/DASH diet -check blood sugar at prescribed times -record values and write them down take them to all doctor visits     Spoke with spouse [place].  She reports that patient not doing as well that he is sleeping most of the day.  Patient sits in the recliner all day and night as wife reports she cannot get him up and daughter only in the home a couple of hours as day.  He has HH PT from [facility].  Wife reports daughter assists with person car and patient able to take his medications.  Patient diet controlled diabetic.  Daughter checks sugars but not regularly per spouse.  Discussed diabetes and management.        Our next appointment is by telephone on 08/13/23 at 1030 am  Please call the care guide team at [phone] if you need to cancel or reschedule your appointment.   If you are experiencing a Mental Health or Behavioral Health Crisis or need someone to talk to, please call the Suicide and Crisis Lifeline: 988   The patient verbalized understanding of instructions, educational materials, and care plan provided today and DECLINED offer to receive copy of patient instructions, educational materials, and care plan.   The patient has been provided with contact information for the care management team and has been advised to call  with any health related questions or concerns.   Bary Leriche, RN, MSN [facility], [facility] Management Community Coordinator Direct Dial: [phone]  Fax: [phone] Website: [email or web address]

## 2023-07-31 ENCOUNTER — Encounter: Payer: Self-pay | Admitting: Licensed Clinical Social Worker

## 2023-07-31 DIAGNOSIS — M19011 Primary osteoarthritis, right shoulder: Secondary | ICD-10-CM | POA: Diagnosis not present

## 2023-07-31 DIAGNOSIS — S2239XD Fracture of one rib, unspecified side, subsequent encounter for fracture with routine healing: Secondary | ICD-10-CM | POA: Diagnosis not present

## 2023-07-31 DIAGNOSIS — N1831 Chronic kidney disease, stage 3a: Secondary | ICD-10-CM | POA: Diagnosis not present

## 2023-07-31 DIAGNOSIS — E785 Hyperlipidemia, unspecified: Secondary | ICD-10-CM | POA: Diagnosis not present

## 2023-07-31 DIAGNOSIS — K2990 Gastroduodenitis, unspecified, without bleeding: Secondary | ICD-10-CM | POA: Diagnosis not present

## 2023-07-31 DIAGNOSIS — D62 Acute posthemorrhagic anemia: Secondary | ICD-10-CM | POA: Diagnosis not present

## 2023-07-31 DIAGNOSIS — I2510 Atherosclerotic heart disease of native coronary artery without angina pectoris: Secondary | ICD-10-CM | POA: Diagnosis not present

## 2023-07-31 DIAGNOSIS — K5790 Diverticulosis of intestine, part unspecified, without perforation or abscess without bleeding: Secondary | ICD-10-CM | POA: Diagnosis not present

## 2023-07-31 DIAGNOSIS — R1310 Dysphagia, unspecified: Secondary | ICD-10-CM | POA: Diagnosis not present

## 2023-07-31 DIAGNOSIS — K222 Esophageal obstruction: Secondary | ICD-10-CM | POA: Diagnosis not present

## 2023-07-31 DIAGNOSIS — K920 Hematemesis: Secondary | ICD-10-CM | POA: Diagnosis not present

## 2023-07-31 DIAGNOSIS — I129 Hypertensive chronic kidney disease with stage 1 through stage 4 chronic kidney disease, or unspecified chronic kidney disease: Secondary | ICD-10-CM | POA: Diagnosis not present

## 2023-07-31 DIAGNOSIS — E1140 Type 2 diabetes mellitus with diabetic neuropathy, unspecified: Secondary | ICD-10-CM | POA: Diagnosis not present

## 2023-07-31 DIAGNOSIS — K449 Diaphragmatic hernia without obstruction or gangrene: Secondary | ICD-10-CM | POA: Diagnosis not present

## 2023-07-31 DIAGNOSIS — E1122 Type 2 diabetes mellitus with diabetic chronic kidney disease: Secondary | ICD-10-CM | POA: Diagnosis not present

## 2023-07-31 DIAGNOSIS — F17210 Nicotine dependence, cigarettes, uncomplicated: Secondary | ICD-10-CM | POA: Diagnosis not present

## 2023-08-01 NOTE — Patient Outreach (Signed)
Care Coordination   Collaboration  Visit Note   07/31/2023 Name: Antonio Serrano MRN: [id number] DOB: 03/02/1946  Antonio Serrano is a 77 y.o. year old male who sees [name], [name], MD for primary care. I  engaged with RNCM  What matters to the patients health and wellness today?  Patient and family was not engaged during this encounter    SDOH assessments and interventions completed:  No     Care Coordination Interventions:  Yes, provided  Interventions Today    Flowsheet Row Most Recent Value  Chronic Disease   Chronic disease during today's visit Hypertension (HTN), Diabetes  General Interventions   General Interventions Discussed/Reviewed Communication with  Communication with RN  [LCSW and RNCM collaborated regarding patient care needs, barriers, and strategies to strenghten support for family]  Safety Interventions   Safety Discussed/Reviewed Safety Discussed       Follow up plan: Follow up call scheduled for 1-2 weeks    Encounter Outcome:  Patient Visit Completed   Jenel Lucks, MSW, LCSW [facility] Care Management [facility]  [facility] [email or web address] Phone [phone] 5:04 PM

## 2023-08-02 ENCOUNTER — Other Ambulatory Visit: Payer: Self-pay | Admitting: Family Medicine

## 2023-08-06 ENCOUNTER — Other Ambulatory Visit: Payer: Self-pay | Admitting: Family Medicine

## 2023-08-06 MED ORDER — SODIUM CHLORIDE 1 G PO TABS: 1.0000 g | ORAL_TABLET | Freq: Two times a day (BID) | ORAL | 1 refills | Status: AC

## 2023-08-06 NOTE — Telephone Encounter (Signed)
Please refer to detailed telephone encounter from 11/5, request to provider for approval

## 2023-08-06 NOTE — Telephone Encounter (Signed)
Spoke with pt's wife, unable to get patient in the car for appt. They are currently working on getting him into a nursing home facility.   Please fill, if appropriate. Pt had last OV on 07/10/23. Last refill 05/14/23 (60,1)

## 2023-08-06 NOTE — Telephone Encounter (Signed)
Prescription Request  08/06/2023  LOV: 07/16/2023  What is the name of the medication or equipment? sodium chloride 1 g tablet   Have you contacted your pharmacy to request a refill? Yes   Which pharmacy would you like this sent to?  [facility] [address] ([address] [place] - [address] [place]) [place] [address] Phone: [phone] Fax: [phone]    Patient notified that their request is being sent to the clinical staff for review and that they should receive a response within 2 business days.   Please advise at [facility] [phone]

## 2023-08-06 NOTE — Telephone Encounter (Signed)
Rx sent 

## 2023-08-07 DIAGNOSIS — I2510 Atherosclerotic heart disease of native coronary artery without angina pectoris: Secondary | ICD-10-CM | POA: Diagnosis not present

## 2023-08-07 DIAGNOSIS — E785 Hyperlipidemia, unspecified: Secondary | ICD-10-CM | POA: Diagnosis not present

## 2023-08-07 DIAGNOSIS — R1310 Dysphagia, unspecified: Secondary | ICD-10-CM | POA: Diagnosis not present

## 2023-08-07 DIAGNOSIS — F17210 Nicotine dependence, cigarettes, uncomplicated: Secondary | ICD-10-CM | POA: Diagnosis not present

## 2023-08-07 DIAGNOSIS — K5790 Diverticulosis of intestine, part unspecified, without perforation or abscess without bleeding: Secondary | ICD-10-CM | POA: Diagnosis not present

## 2023-08-07 DIAGNOSIS — I129 Hypertensive chronic kidney disease with stage 1 through stage 4 chronic kidney disease, or unspecified chronic kidney disease: Secondary | ICD-10-CM | POA: Diagnosis not present

## 2023-08-07 DIAGNOSIS — D62 Acute posthemorrhagic anemia: Secondary | ICD-10-CM | POA: Diagnosis not present

## 2023-08-07 DIAGNOSIS — K2990 Gastroduodenitis, unspecified, without bleeding: Secondary | ICD-10-CM | POA: Diagnosis not present

## 2023-08-07 DIAGNOSIS — K222 Esophageal obstruction: Secondary | ICD-10-CM | POA: Diagnosis not present

## 2023-08-07 DIAGNOSIS — E1122 Type 2 diabetes mellitus with diabetic chronic kidney disease: Secondary | ICD-10-CM | POA: Diagnosis not present

## 2023-08-07 DIAGNOSIS — K449 Diaphragmatic hernia without obstruction or gangrene: Secondary | ICD-10-CM | POA: Diagnosis not present

## 2023-08-07 DIAGNOSIS — K920 Hematemesis: Secondary | ICD-10-CM | POA: Diagnosis not present

## 2023-08-07 DIAGNOSIS — N1831 Chronic kidney disease, stage 3a: Secondary | ICD-10-CM | POA: Diagnosis not present

## 2023-08-07 DIAGNOSIS — E1140 Type 2 diabetes mellitus with diabetic neuropathy, unspecified: Secondary | ICD-10-CM | POA: Diagnosis not present

## 2023-08-07 DIAGNOSIS — M19011 Primary osteoarthritis, right shoulder: Secondary | ICD-10-CM | POA: Diagnosis not present

## 2023-08-07 DIAGNOSIS — S2239XD Fracture of one rib, unspecified side, subsequent encounter for fracture with routine healing: Secondary | ICD-10-CM | POA: Diagnosis not present

## 2023-08-09 ENCOUNTER — Ambulatory Visit (INDEPENDENT_AMBULATORY_CARE_PROVIDER_SITE_OTHER): Payer: Medicare Other | Admitting: Family Medicine

## 2023-08-09 ENCOUNTER — Encounter: Payer: Self-pay | Admitting: Family Medicine

## 2023-08-09 VITALS — BP 102/72 | HR 86 | Wt 138.6 lb

## 2023-08-09 DIAGNOSIS — N411 Chronic prostatitis: Secondary | ICD-10-CM | POA: Diagnosis not present

## 2023-08-09 DIAGNOSIS — N3281 Overactive bladder: Secondary | ICD-10-CM

## 2023-08-09 DIAGNOSIS — N401 Enlarged prostate with lower urinary tract symptoms: Secondary | ICD-10-CM

## 2023-08-09 DIAGNOSIS — E1121 Type 2 diabetes mellitus with diabetic nephropathy: Secondary | ICD-10-CM

## 2023-08-09 DIAGNOSIS — N3001 Acute cystitis with hematuria: Secondary | ICD-10-CM

## 2023-08-09 DIAGNOSIS — R5381 Other malaise: Secondary | ICD-10-CM

## 2023-08-09 DIAGNOSIS — R8290 Unspecified abnormal findings in urine: Secondary | ICD-10-CM

## 2023-08-09 DIAGNOSIS — N138 Other obstructive and reflux uropathy: Secondary | ICD-10-CM

## 2023-08-09 DIAGNOSIS — E871 Hypo-osmolality and hyponatremia: Secondary | ICD-10-CM | POA: Diagnosis not present

## 2023-08-09 LAB — POCT URINALYSIS DIPSTICK
Bilirubin, UA: NEGATIVE
Glucose, UA: POSITIVE — AB
Ketones, UA: NEGATIVE
Nitrite, UA: NEGATIVE
Protein, UA: POSITIVE — AB
Spec Grav, UA: 1.015 (ref 1.010–1.025)
Urobilinogen, UA: NEGATIVE U/dL — AB
pH, UA: 6 (ref 5.0–8.0)

## 2023-08-09 LAB — POCT GLYCOSYLATED HEMOGLOBIN (HGB A1C)
HbA1c POC (<> result, manual entry): 6.4 % (ref 4.0–5.6)
HbA1c, POC (controlled diabetic range): 6.4 % (ref 0.0–7.0)
HbA1c, POC (prediabetic range): 6.4 % (ref 5.7–6.4)
Hemoglobin A1C: 6.4 % — AB (ref 4.0–5.6)

## 2023-08-09 MED ORDER — CIPROFLOXACIN HCL 250 MG PO TABS: 250.0000 mg | ORAL_TABLET | Freq: Two times a day (BID) | ORAL | 0 refills | Status: DC

## 2023-08-09 MED ORDER — MIRABEGRON ER 50 MG PO TB24: 50.0000 mg | ORAL_TABLET | Freq: Every day | ORAL | 0 refills | Status: DC

## 2023-08-09 NOTE — Progress Notes (Signed)
OFFICE VISIT  08/09/2023  CC:  Chief Complaint  Patient presents with   Debilitation Concern    Pt's wife and pt agree to discussing options for a nursing facility. Pt's wife states pt has fell twice this week. They have no preferences on certain facility. Pt would like it to be somewhere closer to home.     Patient is a 77 y.o. male who presents accompanied by his wife and daughter for chronic debilitation, discussion of placement.  INTERIM HX: Antonio Serrano has significant mobility deficits.  He is essentially wheelchair-bound.  He can get in and out of his bed with assistance, can only ambulate a few steps with a walker.  He has a bedside commode that he uses sometimes.  Still having problems with some urge incontinence and urinary frequency.  No fevers.  No flank or abdominal pain.  No blood in urine.  [facility] social work consult was ordered by [place] about 1 month ago.    Past Medical History:  Diagnosis Date   Atherosclerosis of coronary artery    On chest CT->calcif in LAD and RCA   Cerebrovascular disease    03/2023 MRI brain showed chronic microvasc isch changes and small remote infarcts   Chronic low back pain    MR 11/2021 showed L4-5 foraminal stenosis--> plan per Ortho was to do L4-5 injection   Chronic prostatitis    Very mild elevation of PSA (>4), referred to [facility] where PSA repeat was 1.0 on 06/16/13.  Annual PSA repeat is all that is needed now per urologist.  Most recent 07/2015 was 0.45.   Chronic renal insufficiency, stage III (moderate) (HCC)    Baseline GFR as of 2019= 50s.     Closed right hip fracture (HCC) 03/2021   THA   Diabetes mellitus with complication (HCC) Dx'd fall 2011   HTN (hypertension)    Renal/aortic doppler u/s normal 06/2010   Hypercalcemia    Hyperlipidemia 2016   Statin intolerant--myalgias.  Pt refuses any further trial of statin as of 2018.   Nonspecific abnormal electrocardiogram (ECG) (EKG) Fall 2011   TWI in inferior leads: myocardial  perfusion scan neg and echo normal 07/2010   Pseudomonas aeruginosa colonization    12/2021 urine clx   TIA (transient ischemic attack)    12/2021- CT angio head/neck no high grade stenosis.  MR showed old lacunar infarcts corona radiata, basal ganglia, thalamus.  Echo normal.   Tobacco dependence    UTI (lower urinary tract infection)    Feb 2012 (klebsiella--dx at [facility]); 03/2013 e coli.     Past Surgical History:  Procedure Laterality Date   BIOPSY  06/16/2023   Procedure: BIOPSY;  Surgeon: Meridee Score Netty Starring., MD;  Location: [facility];  Service: Gastroenterology;;   CARDIOVASCULAR STRESS TEST  07/01/2010   Myocardial perfusion scan neg/low risk.   CATARACT EXTRACTION  2007, 2008   Bilateral ([facility] [address].   COLONOSCOPY  12/20/04;12/2014   2016 no polyps: recall 5 yrs due to FH of colon ca   ESOPHAGOGASTRODUODENOSCOPY  11/29/2004   esoph stricture/dilation   ESOPHAGOGASTRODUODENOSCOPY N/A 06/16/2023   Procedure: ESOPHAGOGASTRODUODENOSCOPY (EGD);  Surgeon: Lemar Lofty., MD;  Location: [facility];  Service: Gastroenterology;  Laterality: N/A;   SAVORY DILATION N/A 06/16/2023   Procedure: SAVORY DILATION;  Surgeon: Meridee Score Netty Starring., MD;  Location: [facility];  Service: Gastroenterology;  Laterality: N/A;   TOTAL HIP ARTHROPLASTY Right 04/23/2021   Procedure: TOTAL HIP ARTHROPLASTY ANTERIOR APPROACH;  Surgeon: August Saucer,  Corrie Mckusick, MD;  Location: [facility];  Service: Orthopedics;  Laterality: Right;  Hana, C-arm, Depuy   TRANSTHORACIC ECHOCARDIOGRAM  07/01/2010   2011 EF =>55%; LA mildly dilated; trace MR/TR.  12/2021 EF 60-65%, DD indeterm, valves nl.    Outpatient Medications Prior to Visit  Medication Sig Dispense Refill   acetaminophen (TYLENOL) 500 MG tablet Take 1,000 mg by mouth as needed for moderate pain.     amLODipine (NORVASC) 2.5 MG tablet Take 1 tablet (2.5 mg total) by mouth daily. 90 tablet 1   clopidogrel (PLAVIX) 75  MG tablet Take 1 tablet (75 mg total) by mouth daily. 90 tablet 1   Docusate Calcium (STOOL SOFTENER PO) Take 1 tablet by mouth daily as needed (constipation).     ipratropium (ATROVENT) 0.03 % nasal spray USE 2 SPRAY(S) IN EACH NOSTRIL EVERY 12 HOURS (Patient taking differently: Place 1 spray into both nostrils as needed for rhinitis.) 30 mL 11   mirabegron ER (MYRBETRIQ) 25 MG TB24 tablet Take 1 tablet (25 mg total) by mouth daily. 30 tablet 1   OVER THE COUNTER MEDICATION Take 1 tablet by mouth daily. Elderberry, zinc, and vitamin C combo medication OTC.     pantoprazole (PROTONIX) 40 MG tablet Take 1 tablet (40 mg total) by mouth 2 (two) times daily before a meal.     polyethylene glycol (MIRALAX / GLYCOLAX) 17 g packet Take 17 g by mouth daily as needed for mild constipation.     sodium chloride 1 g tablet Take 1 tablet (1 g total) by mouth 2 (two) times daily with a meal. 60 tablet 1   sucralfate (CARAFATE) 1 GM/10ML suspension Take 10 mLs (1 g total) by mouth 4 (four) times daily -  with meals and at bedtime. 420 mL 0   traZODone (DESYREL) 50 MG tablet TAKE 1 TABLET BY MOUTH EVERY DAY AT BEDTIME AS NEEDED FOR SLEEP (Patient not taking: Reported on 07/30/2023) 90 tablet 1   No facility-administered medications prior to visit.    Allergies  Allergen Reactions   Bactrim [Sulfamethoxazole-Trimethoprim] Nausea Only    Review of Systems As per HPI  PE:    08/09/2023    1:44 PM 07/12/2023   12:47 PM 07/12/2023   12:00 PM  Vitals with BMI  Weight 138 lbs 10 oz    Systolic 102 123 161  Diastolic 72 62 80  Pulse 86 78 45  02 sat 97% RA  Physical Exam  General: Tired and frail appearing sitting in wheelchair.  He is alert and oriented and interactive.   LABS:  Last CBC Lab Results  Component Value Date   WBC 8.3 07/12/2023   HGB 15.9 07/12/2023   HCT 46.1 07/12/2023   MCV 85.2 07/12/2023   MCH 29.4 07/12/2023   RDW 14.8 07/12/2023   PLT 344 07/12/2023   Last metabolic  panel Lab Results  Component Value Date   GLUCOSE 147 (H) 07/12/2023   NA 129 (L) 07/12/2023   K 2.9 (L) 07/12/2023   CL 89 (L) 07/12/2023   CO2 26 07/12/2023   BUN 10 07/12/2023   CREATININE 1.38 (H) 07/12/2023   GFRNONAA 53 (L) 07/12/2023   CALCIUM 8.6 (L) 07/12/2023   PHOS 3.7 04/24/2021   PROT 7.1 07/12/2023   ALBUMIN 3.7 07/12/2023   LABGLOB 2.3 02/16/2019   AGRATIO 1.9 02/16/2019   BILITOT 0.6 07/12/2023   ALKPHOS 172 (H) 07/12/2023   AST 18 07/12/2023   ALT 15 07/12/2023   ANIONGAP 14  07/12/2023   Last hemoglobin A1c Lab Results  Component Value Date   HGBA1C 6.4 (A) 08/09/2023   HGBA1C 6.4 08/09/2023   HGBA1C 6.4 08/09/2023   HGBA1C 6.4 08/09/2023   IMPRESSION AND PLAN:  #1 debilitated patient. Family seeking nursing home placement because he is simply beyond the level that they can adequately care for.  Will try to start the arrangements today utilizing home health social work.  #2 BPH and overactive bladder, suspect chronic prostatitis. Cipro 250 twice daily x 14 days prescribed today. He is not clear whether or not he has failed an alpha-blocker in the past--will consider adding in future. He endorses improvement on Myrbetriq 25 mg a day so we will increase to 50.  #3 diabetes with nephropathy. Good control. A1c today is 6.4%  [facility] in the past [facility] social worker note in EMR dated 07/17/23).  An After Visit Summary was printed and given to the patient.  FOLLOW UP: 1 wk  Signed:  Santiago Bumpers, MD           08/09/2023

## 2023-08-09 NOTE — Patient Instructions (Addendum)
Please ask care coordinator for assistance for looking into skilled nursing facility options.   The next call is scheduled for Tuesday 11/12 at 10:30am.   Give 1/2 cap miralax powder in 4-6 ounces of water every 8 hours until you have 1-2 large bowel movements.  I increased your myrbetriq dose to 50mg  once a day.  I sent in prescription for antibiotic to take for 2 weeks (ciprofloxacin).  I have ordered a urology referral.  You will get a call to schedule appointment.

## 2023-08-10 LAB — BASIC METABOLIC PANEL
BUN: 9 mg/dL (ref 7–25)
CO2: 22 mmol/L (ref 20–32)
Calcium: 9.4 mg/dL (ref 8.6–10.3)
Chloride: 94 mmol/L — ABNORMAL LOW (ref 98–110)
Creat: 1.27 mg/dL (ref 0.70–1.28)
Glucose, Bld: 172 mg/dL — ABNORMAL HIGH (ref 65–99)
Potassium: 4 mmol/L (ref 3.5–5.3)
Sodium: 128 mmol/L — ABNORMAL LOW (ref 135–146)

## 2023-08-11 LAB — URINE CULTURE
MICRO NUMBER:: 15706104
SPECIMEN QUALITY:: ADEQUATE

## 2023-08-13 ENCOUNTER — Ambulatory Visit: Payer: Self-pay

## 2023-08-13 NOTE — Patient Instructions (Signed)
Visit Information  Thank you for taking time to visit with me today. Please don't hesitate to contact me if I can be of assistance to you.   Following are the goals we discussed today:   Goals Addressed             This Visit's Progress    Health Maintenance-HTN, DM       Patient Goals/Self Care Activities: -Patient/Caregiver will take medications as prescribed   -Patient/Caregiver will attend all scheduled provider appointments -Patient/Caregiver will call provider office for new concerns or questions   -Calls provider office for new concerns, questions, or BP outside discussed parameters -Checks BP and records as discussed -Follows a low sodium diet/DASH diet -check blood sugar at prescribed times -record values and write them down take them to all doctor visits     Spoke with spouse [place].  She reports that patient doing okay. Patient taken out for MD appointment with help of neighbor.  She reports he hurt his arm trying to get to the doctor.  She reports site is okay, she is keeping it bandaged up.  Discussed signs and symptoms of infection.  Discussed his care. She states daughter is unable to come over as used to daily as she hurt her knee and she is trying to care for him herself and it has been hard.   They discussed placement with physician.  Advised that CM would notify active social worker to assist with placement.  She verbalized understanding.           Our next appointment is by telephone on 08/28/23 at 1100 am  Please call the care guide team at [phone] if you need to cancel or reschedule your appointment.   If you are experiencing a Mental Health or Behavioral Health Crisis or need someone to talk to, please call the Suicide and Crisis Lifeline: 988   The patient verbalized understanding of instructions, educational materials, and care plan provided today and DECLINED offer to receive copy of patient instructions, educational materials, and care plan.    The patient has been provided with contact information for the care management team and has been advised to call with any health related questions or concerns.   Bary Leriche, RN, MSN [facility], [facility] Management Community Coordinator Direct Dial: [phone]  Fax: [phone] Website: [email or web address]

## 2023-08-13 NOTE — Patient Outreach (Signed)
  Care Coordination   Follow Up Visit Note   08/13/2023 Name: Antonio Serrano MRN: [id number] DOB: August 16, 1946  Antonio Serrano is a 77 y.o. year old male who sees [name], [name], MD for primary care. I  spoke with spouse [place] by phone today.  What matters to the patients health and wellness today?  Needs placement    Goals Addressed             This Visit's Progress    Health Maintenance-HTN, DM       Patient Goals/Self Care Activities: -Patient/Caregiver will take medications as prescribed   -Patient/Caregiver will attend all scheduled provider appointments -Patient/Caregiver will call provider office for new concerns or questions   -Calls provider office for new concerns, questions, or BP outside discussed parameters -Checks BP and records as discussed -Follows a low sodium diet/DASH diet -check blood sugar at prescribed times -record values and write them down take them to all doctor visits     Spoke with spouse [place].  She reports that patient doing okay. Patient taken out for MD appointment with help of neighbor.  She reports he hurt his arm trying to get to the doctor.  She reports site is okay, she is keeping it bandaged up.  Discussed signs and symptoms of infection.  Discussed his care. She states daughter is unable to come over as used to daily as she hurt her knee and she is trying to care for him herself and it has been hard.   They discussed placement with physician.  Advised that CM would notify active social worker to assist with placement.  She verbalized understanding.           SDOH assessments and interventions completed:  Yes     Care Coordination Interventions:  Yes, provided   Follow up plan: Follow up call scheduled for 08/28/23    Encounter Outcome:  Patient Visit Completed   Corbin Hott Idelle Jo, RN, MSN [place]  [facility], [facility] Management Community Coordinator Direct Dial: [phone]  Fax:  [phone] Website: [email or web address]

## 2023-08-14 DIAGNOSIS — F17210 Nicotine dependence, cigarettes, uncomplicated: Secondary | ICD-10-CM | POA: Diagnosis not present

## 2023-08-14 DIAGNOSIS — K920 Hematemesis: Secondary | ICD-10-CM | POA: Diagnosis not present

## 2023-08-14 DIAGNOSIS — E1140 Type 2 diabetes mellitus with diabetic neuropathy, unspecified: Secondary | ICD-10-CM | POA: Diagnosis not present

## 2023-08-14 DIAGNOSIS — I129 Hypertensive chronic kidney disease with stage 1 through stage 4 chronic kidney disease, or unspecified chronic kidney disease: Secondary | ICD-10-CM | POA: Diagnosis not present

## 2023-08-14 DIAGNOSIS — N1831 Chronic kidney disease, stage 3a: Secondary | ICD-10-CM | POA: Diagnosis not present

## 2023-08-14 DIAGNOSIS — M19011 Primary osteoarthritis, right shoulder: Secondary | ICD-10-CM | POA: Diagnosis not present

## 2023-08-14 DIAGNOSIS — K222 Esophageal obstruction: Secondary | ICD-10-CM | POA: Diagnosis not present

## 2023-08-14 DIAGNOSIS — K2990 Gastroduodenitis, unspecified, without bleeding: Secondary | ICD-10-CM | POA: Diagnosis not present

## 2023-08-14 DIAGNOSIS — K449 Diaphragmatic hernia without obstruction or gangrene: Secondary | ICD-10-CM | POA: Diagnosis not present

## 2023-08-14 DIAGNOSIS — D62 Acute posthemorrhagic anemia: Secondary | ICD-10-CM | POA: Diagnosis not present

## 2023-08-14 DIAGNOSIS — E1122 Type 2 diabetes mellitus with diabetic chronic kidney disease: Secondary | ICD-10-CM | POA: Diagnosis not present

## 2023-08-14 DIAGNOSIS — R1310 Dysphagia, unspecified: Secondary | ICD-10-CM | POA: Diagnosis not present

## 2023-08-14 DIAGNOSIS — S2239XD Fracture of one rib, unspecified side, subsequent encounter for fracture with routine healing: Secondary | ICD-10-CM | POA: Diagnosis not present

## 2023-08-14 DIAGNOSIS — K5790 Diverticulosis of intestine, part unspecified, without perforation or abscess without bleeding: Secondary | ICD-10-CM | POA: Diagnosis not present

## 2023-08-14 DIAGNOSIS — E785 Hyperlipidemia, unspecified: Secondary | ICD-10-CM | POA: Diagnosis not present

## 2023-08-14 DIAGNOSIS — I2510 Atherosclerotic heart disease of native coronary artery without angina pectoris: Secondary | ICD-10-CM | POA: Diagnosis not present

## 2023-08-15 ENCOUNTER — Telehealth: Payer: Self-pay | Admitting: Licensed Clinical Social Worker

## 2023-08-15 ENCOUNTER — Encounter: Payer: Self-pay | Admitting: Family Medicine

## 2023-08-15 ENCOUNTER — Ambulatory Visit: Payer: Self-pay | Admitting: Licensed Clinical Social Worker

## 2023-08-15 NOTE — Patient Outreach (Signed)
  Care Coordination   Follow Up Visit Note   08/15/2023 Name: Antonio Serrano MRN: [id number] DOB: Jun 10, 1946  Antonio Serrano is a 77 y.o. year old male who sees [name], [name], MD for primary care. I spoke with  Antonio Serrano's wife and adult daughter by phone today.  What matters to the patients health and wellness today?  Level of Care, Caregiver Stress, Symptom Management    Goals Addressed             This Visit's Progress    Obtain Supportive Resources   On track    Activities and task to complete in order to accomplish goals.   Keep all upcoming appointments discussed today Continue with compliance of taking medication prescribed by Doctor Implement healthy coping skills discussed to assist with management of symptoms Follow up with Department of Social Services  Review booklet ''When a loved one need long-term care"          SDOH assessments and interventions completed:  No     Care Coordination Interventions:  Yes, provided  Interventions Today    Flowsheet Row Most Recent Value  Chronic Disease   Chronic disease during today's visit Diabetes, Hypertension (HTN)  General Interventions   General Interventions Discussed/Reviewed Level of Care, Doctor Visits, General Interventions Reviewed  Doctor Visits Discussed/Reviewed Doctor Visits Reviewed  Level of Care Skilled Nursing Facility  Mental Health Interventions   Mental Health Discussed/Reviewed Mental Health Reviewed, Coping Strategies, Anxiety, Depression  Nutrition Interventions   Nutrition Discussed/Reviewed Nutrition Reviewed  Safety Interventions   Safety Discussed/Reviewed Safety Reviewed, Fall Risk, Home Safety       Follow up plan: Follow up call scheduled for within 1 week    Encounter Outcome:  Patient Visit Completed   Jenel Lucks, MSW, LCSW [facility] Care Management [facility]  [facility] [email or web address] Phone [phone] 6:16 PM

## 2023-08-15 NOTE — Patient Outreach (Signed)
  Care Coordination   08/15/2023 Name: Antonio Serrano MRN: [id number] DOB: 1945/10/22   Care Coordination Outreach Attempts:  An unsuccessful telephone outreach was attempted for a scheduled appointment today.  Follow Up Plan:  Additional outreach attempts will be made to offer the patient care coordination information and services.   Encounter Outcome:  No Answer   Care Coordination Interventions:  No, not indicated    Jenel Lucks, MSW, LCSW [facility] Care Management [place]  [facility] [email or web address] Phone [phone] 9:12 AM

## 2023-08-15 NOTE — Patient Instructions (Signed)
Visit Information  Thank you for taking time to visit with me today. Please don't hesitate to contact me if I can be of assistance to you.   Following are the goals we discussed today:   Goals Addressed             This Visit's Progress    Obtain Supportive Resources   On track    Activities and task to complete in order to accomplish goals.   Keep all upcoming appointments discussed today Continue with compliance of taking medication prescribed by Doctor Implement healthy coping skills discussed to assist with management of symptoms Follow up with Department of Social Services  Review booklet ''When a loved one need long-term care"          Our next appointment is by telephone on 11/19 at 3 PM  Please call the care guide team at [phone] if you need to cancel or reschedule your appointment.   If you are experiencing a Mental Health or Behavioral Health Crisis or need someone to talk to, please call the Suicide and Crisis Lifeline: 988 call 911   The patient verbalized understanding of instructions, educational materials, and care plan provided today and DECLINED offer to receive copy of patient instructions, educational materials, and care plan.   Jenel Lucks, MSW, LCSW [facility] Care Management [place]  [facility] [email or web address] Phone [phone] 6:17 PM

## 2023-08-16 ENCOUNTER — Telehealth: Payer: Medicare Other | Admitting: Family Medicine

## 2023-08-16 ENCOUNTER — Encounter: Payer: Self-pay | Admitting: Gastroenterology

## 2023-08-16 ENCOUNTER — Encounter: Payer: Self-pay | Admitting: Family Medicine

## 2023-08-16 ENCOUNTER — Telehealth: Payer: Self-pay | Admitting: Licensed Clinical Social Worker

## 2023-08-16 VITALS — BP 124/83

## 2023-08-16 NOTE — Progress Notes (Signed)
 Patient was not seen today.

## 2023-08-16 NOTE — Patient Instructions (Signed)
Visit Information  Thank you for taking time to visit with me today. Please don't hesitate to contact me if I can be of assistance to you.   Following are the goals we discussed today:   Goals Addressed             This Visit's Progress    Obtain Supportive Resources   On track    Activities and task to complete in order to accomplish goals.   Keep all upcoming appointments discussed today Continue with compliance of taking medication prescribed by Doctor Implement healthy coping skills discussed to assist with management of symptoms Follow up with Department of Social Services  Review booklet ''When a loved one need long-term care"          Our next appointment is by telephone on 11/14  at 9 AM  Please call the care guide team at [phone] if you need to cancel or reschedule your appointment.   If you are experiencing a Mental Health or Behavioral Health Crisis or need someone to talk to, please call the Suicide and Crisis Lifeline: 988 call 911   The patient verbalized understanding of instructions, educational materials, and care plan provided today and DECLINED offer to receive copy of patient instructions, educational materials, and care plan.   Jenel Lucks, MSW, LCSW [facility] Care Management [place]  [facility] [email or web address] Phone [phone] 5:37 AM

## 2023-08-16 NOTE — Patient Outreach (Signed)
  Care Coordination   Follow Up Visit Note   08/13/2023 Name: Antonio Serrano MRN: [id number] DOB: [birth year]  Antonio Serrano is a 77 y.o. year old male who sees [name], [name], MD for primary care. I spoke with  Antonio Serrano's wife by phone today.  What matters to the patients health and wellness today?  Pt's wife agreed to schedule appt with LCSW to discuss placement needs.    Goals Addressed             This Visit's Progress    Obtain Supportive Resources   On track    Activities and task to complete in order to accomplish goals.   Keep all upcoming appointments discussed today Continue with compliance of taking medication prescribed by Doctor Implement healthy coping skills discussed to assist with management of symptoms Follow up with Department of Social Services  Review booklet ''When a loved one need long-term care"          SDOH assessments and interventions completed:  No     Care Coordination Interventions:  Yes, provided  Interventions Today    Flowsheet Row Most Recent Value  Chronic Disease   Chronic disease during today's visit Diabetes, Hypertension (HTN)  General Interventions   General Interventions Discussed/Reviewed General Interventions Reviewed, Level of Care, Communication with  Communication with RN  [LCSW collaborated with [facility] about patient care needs]       Follow up plan: Follow up call scheduled for 08/15/23    Encounter Outcome:  Patient Visit Completed   Jenel Lucks, MSW, LCSW [facility] Care Management [facility]  [facility] [email or web address] Phone [phone] 5:37 AM

## 2023-08-19 ENCOUNTER — Encounter: Payer: Self-pay | Admitting: Family Medicine

## 2023-08-19 ENCOUNTER — Telehealth (INDEPENDENT_AMBULATORY_CARE_PROVIDER_SITE_OTHER): Payer: Medicare Other | Admitting: Family Medicine

## 2023-08-19 DIAGNOSIS — N411 Chronic prostatitis: Secondary | ICD-10-CM | POA: Diagnosis not present

## 2023-08-19 DIAGNOSIS — R531 Weakness: Secondary | ICD-10-CM | POA: Diagnosis not present

## 2023-08-19 DIAGNOSIS — N401 Enlarged prostate with lower urinary tract symptoms: Secondary | ICD-10-CM

## 2023-08-19 DIAGNOSIS — N3281 Overactive bladder: Secondary | ICD-10-CM | POA: Diagnosis not present

## 2023-08-19 DIAGNOSIS — R5381 Other malaise: Secondary | ICD-10-CM | POA: Diagnosis not present

## 2023-08-19 DIAGNOSIS — N138 Other obstructive and reflux uropathy: Secondary | ICD-10-CM

## 2023-08-19 NOTE — Progress Notes (Signed)
Virtual Visit via Video Note  I connected with Antonio Serrano  on 08/19/23 at  1:20 PM EST by a video enabled telemedicine application and verified that I am speaking with the correct person using two identifiers.  Location patient: [place] Location provider:work or home office Persons participating in the virtual visit: patient, provider  I discussed the limitations and requested verbal permission for telemedicine visit. The patient expressed understanding and agreed to proceed.  HPI: 77 year old male being seen today accompanied by his wife [place] and daughter [name] for 10-day follow-up UTI/prostatitis and chronic debilitation.  Cipro 250 twice daily x 14 days prescribed last visit for prostatitis.  Urine culture grew Aerococcus urinae. I increased his Myrbetriq to 50 mg a day.    Update: Urinary symptoms not significantly changed.  He has no pain or blood when urinating.  He just seems to have chronic urinary urgency and frequency and feels like he does not empty all that well. No suprapubic pain or prostate pain. No fever, no nausea.  He does not eat very well per family but he seems to drink fluids fairly well.  They are making some progress towards getting him into a nursing home.  ROS: See pertinent positives and negatives per HPI.  Past Medical History:  Diagnosis Date   Atherosclerosis of coronary artery    On chest CT->calcif in LAD and RCA   Cerebrovascular disease    03/2023 MRI brain showed chronic microvasc isch changes and small remote infarcts   Chronic low back pain    MR 11/2021 showed L4-5 foraminal stenosis--> plan per Ortho was to do L4-5 injection   Chronic prostatitis    Very mild elevation of PSA (>4), referred to [facility] where PSA repeat was 1.0 on 06/16/13.  Annual PSA repeat is all that is needed now per urologist.  Most recent 07/2015 was 0.45.   Chronic renal insufficiency, stage III (moderate) (HCC)    Baseline GFR as of 2019= 50s.     Closed right hip fracture  (HCC) 03/2021   THA   Diabetes mellitus with complication (HCC) Dx'd fall 2011   HTN (hypertension)    Renal/aortic doppler u/s normal 06/2010   Hypercalcemia    Hyperlipidemia 2016   Statin intolerant--myalgias.  Pt refuses any further trial of statin as of 2018.   Nonspecific abnormal electrocardiogram (ECG) (EKG) Fall 2011   TWI in inferior leads: myocardial perfusion scan neg and echo normal 07/2010   Pseudomonas aeruginosa colonization    12/2021 urine clx   TIA (transient ischemic attack)    12/2021- CT angio head/neck no high grade stenosis.  MR showed old lacunar infarcts corona radiata, basal ganglia, thalamus.  Echo normal.   Tobacco dependence    UTI (lower urinary tract infection)    Feb 2012 (klebsiella--dx at [facility]); 03/2013 e coli.     Past Surgical History:  Procedure Laterality Date   BIOPSY  06/16/2023   Procedure: BIOPSY;  Surgeon: Meridee Score Netty Starring., MD;  Location: [facility];  Service: Gastroenterology;;   CARDIOVASCULAR STRESS TEST  07/01/2010   Myocardial perfusion scan neg/low risk.   CATARACT EXTRACTION  2007, 2008   Bilateral ([facility] on [address].   COLONOSCOPY  12/20/04;12/2014   2016 no polyps: recall 5 yrs due to FH of colon ca   ESOPHAGOGASTRODUODENOSCOPY  11/29/2004   esoph stricture/dilation   ESOPHAGOGASTRODUODENOSCOPY N/A 06/16/2023   Procedure: ESOPHAGOGASTRODUODENOSCOPY (EGD);  Surgeon: Lemar Lofty., MD;  Location: [facility];  Service: Gastroenterology;  Laterality: N/A;  SAVORY DILATION N/A 06/16/2023   Procedure: SAVORY DILATION;  Surgeon: Meridee Score Netty Starring., MD;  Location: [facility];  Service: Gastroenterology;  Laterality: N/A;   TOTAL HIP ARTHROPLASTY Right 04/23/2021   Procedure: TOTAL HIP ARTHROPLASTY ANTERIOR APPROACH;  Surgeon: Cammy Copa, MD;  Location: [facility];  Service: Orthopedics;  Laterality: Right;  Hana, C-arm, Depuy   TRANSTHORACIC ECHOCARDIOGRAM  07/01/2010   2011 EF  =>55%; LA mildly dilated; trace MR/TR.  12/2021 EF 60-65%, DD indeterm, valves nl.     Current Outpatient Medications:    acetaminophen (TYLENOL) 500 MG tablet, Take 1,000 mg by mouth as needed for moderate pain., Disp: , Rfl:    amLODipine (NORVASC) 2.5 MG tablet, Take 1 tablet (2.5 mg total) by mouth daily., Disp: 90 tablet, Rfl: 1   ciprofloxacin (CIPRO) 250 MG tablet, Take 1 tablet (250 mg total) by mouth 2 (two) times daily., Disp: 28 tablet, Rfl: 0   clopidogrel (PLAVIX) 75 MG tablet, Take 1 tablet (75 mg total) by mouth daily., Disp: 90 tablet, Rfl: 1   Docusate Calcium (STOOL SOFTENER PO), Take 1 tablet by mouth daily as needed (constipation)., Disp: , Rfl:    ipratropium (ATROVENT) 0.03 % nasal spray, USE 2 SPRAY(S) IN EACH NOSTRIL EVERY 12 HOURS (Patient taking differently: Place 1 spray into both nostrils as needed for rhinitis.), Disp: 30 mL, Rfl: 11   mirabegron ER (MYRBETRIQ) 50 MG TB24 tablet, Take 1 tablet (50 mg total) by mouth daily., Disp: 30 tablet, Rfl: 0   OVER THE COUNTER MEDICATION, Take 1 tablet by mouth daily. Elderberry, zinc, and vitamin C combo medication OTC., Disp: , Rfl:    pantoprazole (PROTONIX) 40 MG tablet, Take 1 tablet (40 mg total) by mouth 2 (two) times daily before a meal., Disp: , Rfl:    polyethylene glycol (MIRALAX / GLYCOLAX) 17 g packet, Take 17 g by mouth daily as needed for mild constipation., Disp: , Rfl:    sodium chloride 1 g tablet, Take 1 tablet (1 g total) by mouth 2 (two) times daily with a meal., Disp: 60 tablet, Rfl: 1   sucralfate (CARAFATE) 1 GM/10ML suspension, Take 10 mLs (1 g total) by mouth 4 (four) times daily -  with meals and at bedtime., Disp: 420 mL, Rfl: 0   traZODone (DESYREL) 50 MG tablet, TAKE 1 TABLET BY MOUTH EVERY DAY AT BEDTIME AS NEEDED FOR SLEEP (Patient not taking: Reported on 07/30/2023), Disp: 90 tablet, Rfl: 1  EXAM:  VITALS per patient if applicable:     08/16/2023    3:08 PM 08/09/2023    1:44 PM 07/12/2023    12:47 PM  Vitals with BMI  Weight  138 lbs 10 oz   Systolic 124 102 841  Diastolic 83 72 62  Pulse  86 78     GENERAL: alert, oriented, appears well and in no acute distress  HEENT: atraumatic, conjunttiva clear, no obvious abnormalities on inspection of external nose and ears  NECK: normal movements of the head and neck  LUNGS: on inspection no signs of respiratory distress, breathing rate appears normal, no obvious gross SOB, gasping or wheezing  CV: no obvious cyanosis  MS: moves all visible extremities without noticeable abnormality  PSYCH/NEURO: pleasant and cooperative, no obvious depression or anxiety, speech and thought processing grossly intact  LABS: none today    Chemistry      Component Value Date/Time   NA 128 (L) 08/09/2023 1435   NA 126 (A) 02/17/2019 0000   K  4.0 08/09/2023 1435   CL 94 (L) 08/09/2023 1435   CO2 22 08/09/2023 1435   BUN 9 08/09/2023 1435   BUN 12 02/17/2019 0000   CREATININE 1.27 08/09/2023 1435   GLU 100 02/17/2019 0000      Component Value Date/Time   CALCIUM 9.4 08/09/2023 1435   ALKPHOS 172 (H) 07/12/2023 1237   AST 18 07/12/2023 1237   ALT 15 07/12/2023 1237   BILITOT 0.6 07/12/2023 1237   BILITOT 0.3 02/16/2019 0953     Lab Results  Component Value Date   HGBA1C 6.4 (A) 08/09/2023   HGBA1C 6.4 08/09/2023   HGBA1C 6.4 08/09/2023   HGBA1C 6.4 08/09/2023   ASSESSMENT AND PLAN:  Discussed the following assessment and plan:  #1 BPH with lower urinary tract obstructive symptoms, also overactive bladder symptoms, with recent superimposed UTI.  I think he is stable on Myrbetriq 50 mg a day. He will finish out his Cipro in the next few days.  Encouraged good p.o. intake.  2.  Generalized weakness, debilitation. Unchanged. He spends all of his time in his lazy boy chair or a bed. With full assistance he can get onto a bedside potty. He pees in a jug.  His family is being helped by a Child psychotherapist to get things  arranged for him to go into a nursing home.  [facility] is the one that they are going to try first. I told them to have the nursing home fax [place] an FL 2 form when they are ready.  I discussed the assessment and treatment plan with the patient. The patient was provided an opportunity to ask questions and all were answered. The patient agreed with the plan and demonstrated an understanding of the instructions.   F/u: 2 weeks  Signed:  Santiago Bumpers, MD           08/19/2023

## 2023-08-20 ENCOUNTER — Ambulatory Visit: Payer: Self-pay | Admitting: Licensed Clinical Social Worker

## 2023-08-20 ENCOUNTER — Telehealth: Payer: Self-pay | Admitting: Licensed Clinical Social Worker

## 2023-08-20 NOTE — Patient Instructions (Signed)
Visit Information  Thank you for taking time to visit with me today. Please don't hesitate to contact me if I can be of assistance to you.   Following are the goals we discussed today:   Goals Addressed             This Visit's Progress    Obtain Supportive Resources   On track    Activities and task to complete in order to accomplish goals.   Keep all upcoming appointments discussed today Continue with compliance of taking medication prescribed by Doctor Implement healthy coping skills discussed to assist with management of symptoms Follow up with Department of Social Services  Review booklet ''When a loved one need long-term care"          Our next appointment is by telephone on 11/19 at 3 PM  Please call the care guide team at [phone] if you need to cancel or reschedule your appointment.   If you are experiencing a Mental Health or Behavioral Health Crisis or need someone to talk to, please call the Suicide and Crisis Lifeline: 988 call 911   The patient verbalized understanding of instructions, educational materials, and care plan provided today and DECLINED offer to receive copy of patient instructions, educational materials, and care plan.   Jenel Lucks, MSW, LCSW [facility] Care Management [place]  [facility] [email or web address] Phone [phone] 4:22 PM

## 2023-08-20 NOTE — Patient Outreach (Signed)
  Care Coordination   Follow Up Visit Note   08/19/2023 Name: Antonio Serrano MRN: [id number] DOB: 1946/04/06  Antonio Serrano is a 77 y.o. year old male who sees [name], [name], MD for primary care. I spoke with  Antonio Serrano's daughter by phone today.  What matters to the patients health and wellness today?  Level of Care     Goals Addressed             This Visit's Progress    Obtain Supportive Resources   On track    Activities and task to complete in order to accomplish goals.   Keep all upcoming appointments discussed today Continue with compliance of taking medication prescribed by Doctor Implement healthy coping skills discussed to assist with management of symptoms Follow up with Department of Social Services  Review booklet ''When a loved one need long-term care"          SDOH assessments and interventions completed:  No     Care Coordination Interventions:  Yes, provided  Interventions Today    Flowsheet Row Most Recent Value  Chronic Disease   Chronic disease during today's visit Diabetes, Hypertension (HTN)  General Interventions   General Interventions Discussed/Reviewed General Interventions Reviewed, Level of Care, Doctor Visits  Doctor Visits Discussed/Reviewed Doctor Visits Reviewed  Level of Care Applications  Applications Medicaid, FL-2  Education Interventions   Applications Medicaid, [place]-2  Mental Health Interventions   Mental Health Discussed/Reviewed Mental Health Reviewed, Coping Strategies       Follow up plan: Follow up call scheduled for 11/19    Encounter Outcome:  Patient Visit Completed   Jenel Lucks, MSW, LCSW [facility] Care Management [facility]  [facility] [email or web address] Phone [phone] 4:22 PM

## 2023-08-21 NOTE — Patient Instructions (Signed)
Visit Information  Thank you for taking time to visit with me today. Please don't hesitate to contact me if I can be of assistance to you.   Following are the goals we discussed today:   Goals Addressed             This Visit's Progress    Obtain Supportive Resources   On track    Activities and task to complete in order to accomplish goals.   Keep all upcoming appointments discussed today Continue with compliance of taking medication prescribed by Doctor Implement healthy coping skills discussed to assist with management of symptoms Follow up with Department of Social Services  Review booklet ''When a loved one need long-term care"          Our next appointment is by telephone on 12/2 at 3 PM  Please call the care guide team at [phone] if you need to cancel or reschedule your appointment.   If you are experiencing a Mental Health or Behavioral Health Crisis or need someone to talk to, please call the Suicide and Crisis Lifeline: 988 call 911   The patient verbalized understanding of instructions, educational materials, and care plan provided today and DECLINED offer to receive copy of patient instructions, educational materials, and care plan.   Jenel Lucks, MSW, LCSW [facility] Care Management [place]  [facility] [email or web address] Phone [phone] 9:05 AM

## 2023-08-21 NOTE — Patient Outreach (Signed)
  Care Coordination   Follow Up Visit Note   08/20/2023 Name: Antonio Serrano MRN: [id number] DOB: [birth year]  Antonio Serrano is a 77 y.o. year old male who sees [name], [name], MD for primary care. I spoke with  Antonio Serrano by phone today.  What matters to the patients health and wellness today?  Level of Care and Caregiver Stress    Goals Addressed             This Visit's Progress    Obtain Supportive Resources   On track    Activities and task to complete in order to accomplish goals.   Keep all upcoming appointments discussed today Continue with compliance of taking medication prescribed by Doctor Implement healthy coping skills discussed to assist with management of symptoms Follow up with Department of Social Services  Review booklet ''When a loved one need long-term care"          SDOH assessments and interventions completed:  No     Care Coordination Interventions:  Yes, provided  Interventions Today    Flowsheet Row Most Recent Value  Chronic Disease   Chronic disease during today's visit Diabetes, Hypertension (HTN)  General Interventions   General Interventions Discussed/Reviewed Level of Care, Doctor Visits, General Interventions Reviewed  Doctor Visits Discussed/Reviewed Doctor Visits Reviewed  Applications FL-2  [Facility ([facility]) has faxed FL2 to PCP office for completion. Documents for LTC MA is in process of submission to DSS]  Education Interventions   Applications FL-2  [Facility ([facility]) has faxed FL2 to PCP office for completion. Documents for LTC MA is in process of submission to DSS]  Mental Health Interventions   Mental Health Discussed/Reviewed Mental Health Reviewed, Coping Strategies, Anxiety  [Caregiver Stress discussed. Stress and anxiety management strategies discussed]  Safety Interventions   Safety Discussed/Reviewed Safety Reviewed, Fall Risk       Follow up plan: Follow up call scheduled for 1-2 weeks    Encounter  Outcome:  Patient Visit Completed   Jenel Lucks, MSW, LCSW [facility] Care Management [facility]  [facility] [email or web address] Phone [phone] 9:04 AM

## 2023-08-22 ENCOUNTER — Telehealth: Payer: Self-pay

## 2023-08-22 NOTE — Telephone Encounter (Signed)
Requested they send FL2 to be filled out. They are to call or fax

## 2023-08-27 DIAGNOSIS — Z0279 Encounter for issue of other medical certificate: Secondary | ICD-10-CM

## 2023-08-27 NOTE — Telephone Encounter (Signed)
Patient daughter calling to check status on FL2 Form. Family is trying to get patient into [facility]. From review of chart, our office has not rec'd FL2 form from [facility]. I called and left voicemail for [facility] in Admissions at [phone]. I left detailed message regarding patient. Requested Belenda Cruise to fax FL2 form to our office,provided her with fax #.

## 2023-08-27 NOTE — Telephone Encounter (Signed)
Placed on PCP desk for signature. 

## 2023-08-27 NOTE — Telephone Encounter (Signed)
Type of forms received: FL2 Form  Routed to:  Team [name]   Paperwork received by :  via fax   Individual made aware of 5-7 business day turn around (Y/N): n/a  Form completed and patient made aware of charges(Y/N): n/a   Form location:  handed to [facility]

## 2023-08-28 ENCOUNTER — Other Ambulatory Visit: Payer: Self-pay | Admitting: *Deleted

## 2023-08-28 NOTE — Patient Outreach (Signed)
Care Management   Visit Note  08/28/2023 Name: Antonio Serrano MRN: [id number] DOB: 10/30/1945  Subjective: Antonio Serrano is a 77 y.o. year old male who is a primary care patient of [name], [name], MD. The Care Management team was consulted for assistance.      Engaged with patient spoke with the family member (POA, [place], [place]).    Goals Addressed             This Visit's Progress    RNCM Care Management Expected Outcomes: Monitor, Self-Manage and Reduce Symptoms of: DM, HTN, OAB & Debility       Current Barriers:  Knowledge Deficits related to plan of care for management of HTN, DMII, Overactive Bladder, and Debility  Chronic Disease Management support and education needs related to HTN, DMII, Overactive Bladder, and Debility  Lacks caregiver support. Family seeking SNF placement at [facility] in [place], [place] due to patient debility. Family expresses it is difficult to continue providing the care he needs at home.  RNCM Clinical Goal(s):  Patient will verbalize basic understanding of  HTN, DMII, Overactive Bladder, and Debility disease process and self health management plan as evidenced by verbal explanation, recognizing symptoms, lifestyle modifications take all medications exactly as prescribed and will call provider for medication related questions as evidenced by compliance with all medications attend all scheduled medical appointments: with primary care provider and specialist as evidenced by keeping all scheduled appointments demonstrate Improved and Ongoing adherence to prescribed treatment plan for HTN, DMII, Overactive Bladder, and Debility as evidenced by consistent medication compliance, symptom monitoring, continued lifestyle modifications continue to work with RN Care Manager to address care management and care coordination needs related to  HTN, DMII, Overactive Bladder, and Debility as evidenced by adherence to CM Team Scheduled appointments work with  Child psychotherapist to address  related to the management of Level of care concerns related to the management of debility as evidenced by review of EMR and patient or social worker report through collaboration with Medical illustrator, provider, and care team. Has scheduled follow up SW on 09-02-2023. Maintain or improve functional ability by safely performing ADLs independently or with minimal assistance Optimize nutrition by maintain weight with adequate protein intake  Interventions: Evaluation of current treatment plan related to  self management and patient's adherence to plan as established by provider   Diabetes Interventions:  (Status:  Goal on track:  Yes. and Condition stable.  Not addressed this visit.) Long Term Goal Assessed patient's understanding of A1c goal: <6.5% Provided education to patient about basic DM disease process. Per patient & [name], patient does not regularly check his blood sugar and when he does it ranges between 90s-100s. No reports of hypoglycemia noted. DM is diet controlled at this time. Reviewed medications with patient and discussed importance of medication adherence Discussed plans with patient for ongoing care management follow up and provided patient with direct contact information for care management team Provided patient with written educational materials related to hypo and hyperglycemia and importance of correct treatment Review of patient status, including review of consultants reports, relevant laboratory and other test results, and medications completed Lab Results  Component Value Date   HGBA1C 6.4 (A) 08/09/2023   HGBA1C 6.4 08/09/2023   HGBA1C 6.4 08/09/2023   HGBA1C 6.4 08/09/2023    Falls Interventions:  (Status:  New goal. and Goal on track:  Yes.) Long Term Goal Provided written and verbal education re: potential causes of falls and Fall prevention strategies Reviewed medications  and discussed potential side effects of medications such as dizziness  and frequent urination Advised patient of importance of notifying provider of falls Assessed for falls since last encounter. Per patient, his last fall was approximately 2 weeks ago with injury noted to his hand which are almost healed. Assessed patients knowledge of fall risk prevention secondary to previously provided education   Debility  (Status:  New goal. and Goal on track:  Yes.)  Long Term Goal Gradual activity progression by encouraging gentle exercises such as chair exercise and range of motion and gradually increase intensity as tolerated. Per [name], home health has signed off and is no longer following the patient. RNCM advised to continue to follow the exercises that were provided by home health PT/OT until patient has been successfully placed in a SNF. Per [name], she made a request for an FL2 to be completed and signed by the provider but it has not be received. She expresses concerns because this is needed for the SNF as well as Medicaid as she is apply for LTC Medicaid for placement purposes. RNCM will send reminder to provider for follow up on FL2. Pace activities by scheduling rest periods between activities to conserve energy Utilize assistive devices such as canes, walks or wheelchairs as needed to support mobility. Per [name], patient is in need of a new wheelchair, RNCM advised a request to provider would be sent. Work closely with the healthcare team to ensure all needs are being met.  RNCM encourage frequent position changes every 2 hours due to patient being positioned in recliner day/night as this will help prevent any skin breakdown.  Hypertension Interventions:  (Status:  New goal. and Goal on track:  Yes.) Long Term Goal Last practice recorded BP readings:  BP Readings from Last 3 Encounters:  08/16/23 124/83  08/09/23 102/72  07/12/23 123/62   Most recent eGFR/CrCl: No results found for: "EGFR"  No components found for: "CRCL"  Evaluation of current treatment plan  related to hypertension self management and patient's adherence to plan as established by provider Provided education to patient re: stroke prevention, s/s of heart attack and stroke Reviewed medications with patient and discussed importance of compliance Counseled on the importance of exercise goals with target of 150 minutes per week Discussed plans with patient for ongoing care management follow up and provided patient with direct contact information for care management team Advised patient, providing education and rationale, to monitor blood pressure daily and record, calling PCP for findings outside established parameters. Per patient & [name], he use to frequently check his blood pressure but recently has stopped and only checks when he feels differently. RNCM advised to continue checking BP frequently and record results. Reviewed scheduled/upcoming provider appointments including: 12-04-2022 for AWV Discussed complications of poorly controlled blood pressure such as heart disease, stroke, circulatory complications, vision complications, kidney impairment, sexual dysfunction   Overactive Bladder  (Status:  New goal. and Goal on track:  Yes.)  Long Term Goal Monitor fluid intake throughout the day Identify and avoid bladder irritants such as caffeine, alcohol, carbonated beverages Bladder training: establish a timed voiding schedule, if needed Compliance with prescribed medication, Myrbetriq for overactive bladder Monitor for signs/symptoms of UTI. Patient recently finished Cipro for Aerococcus urinae.  Patient Goals/Self-Care Activities: Take all medications as prescribed Attend all scheduled provider appointments Call pharmacy for medication refills 3-7 days in advance of running out of medications Attend church or other social activities Call provider office for new concerns or questions  Work  with the social worker to address care coordination needs and will continue to work with the  clinical team to address health care and disease management related needs check blood pressure daily write blood pressure results in a log or diary  Follow Up Plan:  Telephone follow up appointment with care management team member scheduled for:  09-27-2023 at 1:45 pm             Consent to Services:  Patient was given information about care management services, agreed to services, and gave verbal consent to participate.   Plan: Telephone follow up appointment with care management team member scheduled for:09-27-2023 at 1:45 pm  Danise Edge, BSN RN RN Care Manager  [facility]  Ambulatory Care Management  Direct Number: [phone]

## 2023-08-28 NOTE — Patient Instructions (Signed)
Visit Information  Thank you for taking time to visit with me today. Please don't hesitate to contact me if I can be of assistance to you before our next scheduled telephone appointment.  Following are the goals we discussed today:   Goals Addressed             This Visit's Progress    RNCM Care Management Expected Outcomes: Monitor, Self-Manage and Reduce Symptoms of: DM, HTN, OAB & Debility       Current Barriers:  Knowledge Deficits related to plan of care for management of HTN, DMII, Overactive Bladder, and Debility  Chronic Disease Management support and education needs related to HTN, DMII, Overactive Bladder, and Debility  Lacks caregiver support. Family seeking SNF placement at [facility] in [place], [place] due to patient debility. Family expresses it is difficult to continue providing the care he needs at home.  RNCM Clinical Goal(s):  Patient will verbalize basic understanding of  HTN, DMII, Overactive Bladder, and Debility disease process and self health management plan as evidenced by verbal explanation, recognizing symptoms, lifestyle modifications take all medications exactly as prescribed and will call provider for medication related questions as evidenced by compliance with all medications attend all scheduled medical appointments: with primary care provider and specialist as evidenced by keeping all scheduled appointments demonstrate Improved and Ongoing adherence to prescribed treatment plan for HTN, DMII, Overactive Bladder, and Debility as evidenced by consistent medication compliance, symptom monitoring, continued lifestyle modifications continue to work with RN Care Manager to address care management and care coordination needs related to  HTN, DMII, Overactive Bladder, and Debility as evidenced by adherence to CM Team Scheduled appointments work with Child psychotherapist to address  related to the management of Level of care concerns related to the management of debility  as evidenced by review of EMR and patient or social worker report through collaboration with Medical illustrator, provider, and care team. Has scheduled follow up SW on 09-02-2023. Maintain or improve functional ability by safely performing ADLs independently or with minimal assistance Optimize nutrition by maintain weight with adequate protein intake  Interventions: Evaluation of current treatment plan related to  self management and patient's adherence to plan as established by provider   Diabetes Interventions:  (Status:  Goal on track:  Yes. and Condition stable.  Not addressed this visit.) Long Term Goal Assessed patient's understanding of A1c goal: <6.5% Provided education to patient about basic DM disease process. Per patient & [name], patient does not regularly check his blood sugar and when he does it ranges between 90s-100s. No reports of hypoglycemia noted. DM is diet controlled at this time. Reviewed medications with patient and discussed importance of medication adherence Discussed plans with patient for ongoing care management follow up and provided patient with direct contact information for care management team Provided patient with written educational materials related to hypo and hyperglycemia and importance of correct treatment Review of patient status, including review of consultants reports, relevant laboratory and other test results, and medications completed Lab Results  Component Value Date   HGBA1C 6.4 (A) 08/09/2023   HGBA1C 6.4 08/09/2023   HGBA1C 6.4 08/09/2023   HGBA1C 6.4 08/09/2023    Falls Interventions:  (Status:  New goal. and Goal on track:  Yes.) Long Term Goal Provided written and verbal education re: potential causes of falls and Fall prevention strategies Reviewed medications and discussed potential side effects of medications such as dizziness and frequent urination Advised patient of importance of notifying provider of  falls Assessed for falls since last  encounter. Per patient, his last fall was approximately 2 weeks ago with injury noted to his hand which are almost healed. Assessed patients knowledge of fall risk prevention secondary to previously provided education   Debility  (Status:  New goal. and Goal on track:  Yes.)  Long Term Goal Gradual activity progression by encouraging gentle exercises such as chair exercise and range of motion and gradually increase intensity as tolerated. Per [name], home health has signed off and is no longer following the patient. RNCM advised to continue to follow the exercises that were provided by home health PT/OT until patient has been successfully placed in a SNF. Per [name], she made a request for an FL2 to be completed and signed by the provider but it has not be received. She expresses concerns because this is needed for the SNF as well as Medicaid as she is apply for LTC Medicaid for placement purposes. RNCM will send reminder to provider for follow up on FL2. Pace activities by scheduling rest periods between activities to conserve energy Utilize assistive devices such as canes, walks or wheelchairs as needed to support mobility. Per [name], patient is in need of a new wheelchair, RNCM advised a request to provider would be sent. Work closely with the healthcare team to ensure all needs are being met.  RNCM encourage frequent position changes every 2 hours due to patient being positioned in recliner day/night as this will help prevent any skin breakdown.  Hypertension Interventions:  (Status:  New goal. and Goal on track:  Yes.) Long Term Goal Last practice recorded BP readings:  BP Readings from Last 3 Encounters:  08/16/23 124/83  08/09/23 102/72  07/12/23 123/62   Most recent eGFR/CrCl: No results found for: "EGFR"  No components found for: "CRCL"  Evaluation of current treatment plan related to hypertension self management and patient's adherence to plan as established by provider Provided education  to patient re: stroke prevention, s/s of heart attack and stroke Reviewed medications with patient and discussed importance of compliance Counseled on the importance of exercise goals with target of 150 minutes per week Discussed plans with patient for ongoing care management follow up and provided patient with direct contact information for care management team Advised patient, providing education and rationale, to monitor blood pressure daily and record, calling PCP for findings outside established parameters. Per patient & [name], he use to frequently check his blood pressure but recently has stopped and only checks when he feels differently. RNCM advised to continue checking BP frequently and record results. Reviewed scheduled/upcoming provider appointments including: 12-04-2022 for AWV Discussed complications of poorly controlled blood pressure such as heart disease, stroke, circulatory complications, vision complications, kidney impairment, sexual dysfunction   Overactive Bladder  (Status:  New goal. and Goal on track:  Yes.)  Long Term Goal Monitor fluid intake throughout the day Identify and avoid bladder irritants such as caffeine, alcohol, carbonated beverages Bladder training: establish a timed voiding schedule, if needed Compliance with prescribed medication, Myrbetriq for overactive bladder Monitor for signs/symptoms of UTI. Patient recently finished Cipro for Aerococcus urinae.  Patient Goals/Self-Care Activities: Take all medications as prescribed Attend all scheduled provider appointments Call pharmacy for medication refills 3-7 days in advance of running out of medications Attend church or other social activities Call provider office for new concerns or questions  Work with the social worker to address care coordination needs and will continue to work with the clinical team to address health  care and disease management related needs check blood pressure daily write blood pressure  results in a log or diary  Follow Up Plan:  Telephone follow up appointment with care management team member scheduled for:  09-27-2023 at 1:45 pm           Our next appointment is by telephone on 09-27-2023 at 1:45 pm  Please call the care guide team at [phone] if you need to cancel or reschedule your appointment.   If you are experiencing a Mental Health or Behavioral Health Crisis or need someone to talk to, please call the Suicide and Crisis Lifeline: 988 call the [place] National Suicide Prevention Lifeline: 859 710 0392 or TTY: 269-861-1038 TTY 856-627-9595) to talk to a trained counselor call 1-800-273-TALK (toll free, 24 hour hotline) go to [facility] [address] [place] [phone]) call 911   The patient verbalized understanding of instructions, educational materials, and care plan provided today and DECLINED offer to receive copy of patient instructions, educational materials, and care plan.   Danise Edge, BSN RN RN Care Manager  [place]  Ambulatory Care Management  Direct Number: [phone]

## 2023-09-02 ENCOUNTER — Telehealth: Payer: Self-pay | Admitting: Licensed Clinical Social Worker

## 2023-09-02 ENCOUNTER — Encounter: Payer: Self-pay | Admitting: Licensed Clinical Social Worker

## 2023-09-02 NOTE — Patient Outreach (Signed)
  Care Coordination   09/02/2023 Name: Antonio Serrano MRN: [id number] DOB: 06-26-1946   Care Coordination Outreach Attempts:  An unsuccessful telephone outreach was attempted for a scheduled appointment today.  Follow Up Plan:  Additional outreach attempts will be made to offer the patient care coordination information and services.   Encounter Outcome:  No Answer   Care Coordination Interventions:  No, not indicated    Jenel Lucks, MSW, LCSW [facility] Care Management [place]  [facility] [email or web address] Phone [phone] 3:51 PM

## 2023-09-20 ENCOUNTER — Other Ambulatory Visit: Payer: Self-pay | Admitting: Family Medicine

## 2023-09-23 ENCOUNTER — Telehealth: Payer: Self-pay | Admitting: Family Medicine

## 2023-09-23 NOTE — Telephone Encounter (Unsigned)
Copied from CRM [id number]. Topic: General - Other >> Sep 23, 2023  2:58 PM Leavy Cella D wrote: Reason for CRM: Patients wife called on behalf of patient stating that the patient does not want to be admitted into nursing home .Wife has set patient up with home nurse and a hospital bed .

## 2023-09-25 ENCOUNTER — Inpatient Hospital Stay (HOSPITAL_COMMUNITY)
Admission: EM | Admit: 2023-09-25 | Discharge: 2023-10-11 | DRG: 336 | Disposition: A | Discharge status: Skilled Nursing Facility | Payer: Medicare Other | Attending: Internal Medicine | Admitting: Internal Medicine

## 2023-09-25 ENCOUNTER — Emergency Department (HOSPITAL_COMMUNITY): Payer: Medicare Other

## 2023-09-25 ENCOUNTER — Other Ambulatory Visit: Payer: Self-pay

## 2023-09-25 ENCOUNTER — Encounter (HOSPITAL_COMMUNITY): Payer: Self-pay

## 2023-09-25 DIAGNOSIS — K56609 Unspecified intestinal obstruction, unspecified as to partial versus complete obstruction: Secondary | ICD-10-CM | POA: Diagnosis not present

## 2023-09-25 DIAGNOSIS — Z515 Encounter for palliative care: Secondary | ICD-10-CM | POA: Diagnosis not present

## 2023-09-25 DIAGNOSIS — R4589 Other symptoms and signs involving emotional state: Secondary | ICD-10-CM | POA: Diagnosis not present

## 2023-09-25 DIAGNOSIS — I679 Cerebrovascular disease, unspecified: Secondary | ICD-10-CM | POA: Diagnosis not present

## 2023-09-25 DIAGNOSIS — K389 Disease of appendix, unspecified: Secondary | ICD-10-CM | POA: Diagnosis not present

## 2023-09-25 DIAGNOSIS — Z87440 Personal history of urinary (tract) infections: Secondary | ICD-10-CM

## 2023-09-25 DIAGNOSIS — F17210 Nicotine dependence, cigarettes, uncomplicated: Secondary | ICD-10-CM | POA: Diagnosis not present

## 2023-09-25 DIAGNOSIS — E876 Hypokalemia: Secondary | ICD-10-CM | POA: Diagnosis not present

## 2023-09-25 DIAGNOSIS — E785 Hyperlipidemia, unspecified: Secondary | ICD-10-CM | POA: Diagnosis present

## 2023-09-25 DIAGNOSIS — R5381 Other malaise: Secondary | ICD-10-CM | POA: Diagnosis not present

## 2023-09-25 DIAGNOSIS — N179 Acute kidney failure, unspecified: Secondary | ICD-10-CM | POA: Diagnosis present

## 2023-09-25 DIAGNOSIS — B970 Adenovirus as the cause of diseases classified elsewhere: Secondary | ICD-10-CM | POA: Diagnosis not present

## 2023-09-25 DIAGNOSIS — Z22322 Carrier or suspected carrier of Methicillin resistant Staphylococcus aureus: Secondary | ICD-10-CM

## 2023-09-25 DIAGNOSIS — K36 Other appendicitis: Secondary | ICD-10-CM | POA: Diagnosis not present

## 2023-09-25 DIAGNOSIS — I2510 Atherosclerotic heart disease of native coronary artery without angina pectoris: Secondary | ICD-10-CM | POA: Diagnosis present

## 2023-09-25 DIAGNOSIS — M8448XA Pathological fracture, other site, initial encounter for fracture: Secondary | ICD-10-CM | POA: Diagnosis not present

## 2023-09-25 DIAGNOSIS — A0811 Acute gastroenteropathy due to Norwalk agent: Secondary | ICD-10-CM | POA: Diagnosis not present

## 2023-09-25 DIAGNOSIS — Z800 Family history of malignant neoplasm of digestive organs: Secondary | ICD-10-CM | POA: Diagnosis not present

## 2023-09-25 DIAGNOSIS — Z2239 Carrier of other specified bacterial diseases: Secondary | ICD-10-CM

## 2023-09-25 DIAGNOSIS — R918 Other nonspecific abnormal finding of lung field: Secondary | ICD-10-CM | POA: Diagnosis not present

## 2023-09-25 DIAGNOSIS — E1122 Type 2 diabetes mellitus with diabetic chronic kidney disease: Secondary | ICD-10-CM | POA: Diagnosis present

## 2023-09-25 DIAGNOSIS — E872 Acidosis, unspecified: Secondary | ICD-10-CM | POA: Diagnosis present

## 2023-09-25 DIAGNOSIS — Z7401 Bed confinement status: Secondary | ICD-10-CM | POA: Diagnosis not present

## 2023-09-25 DIAGNOSIS — K5909 Other constipation: Secondary | ICD-10-CM | POA: Diagnosis present

## 2023-09-25 DIAGNOSIS — K56699 Other intestinal obstruction unspecified as to partial versus complete obstruction: Secondary | ICD-10-CM | POA: Diagnosis not present

## 2023-09-25 DIAGNOSIS — R262 Difficulty in walking, not elsewhere classified: Secondary | ICD-10-CM | POA: Diagnosis not present

## 2023-09-25 DIAGNOSIS — K2990 Gastroduodenitis, unspecified, without bleeding: Secondary | ICD-10-CM | POA: Diagnosis present

## 2023-09-25 DIAGNOSIS — K388 Other specified diseases of appendix: Secondary | ICD-10-CM | POA: Diagnosis not present

## 2023-09-25 DIAGNOSIS — R531 Weakness: Secondary | ICD-10-CM | POA: Diagnosis not present

## 2023-09-25 DIAGNOSIS — R112 Nausea with vomiting, unspecified: Secondary | ICD-10-CM | POA: Diagnosis not present

## 2023-09-25 DIAGNOSIS — N1831 Chronic kidney disease, stage 3a: Secondary | ICD-10-CM | POA: Diagnosis not present

## 2023-09-25 DIAGNOSIS — K565 Intestinal adhesions [bands], unspecified as to partial versus complete obstruction: Principal | ICD-10-CM | POA: Diagnosis present

## 2023-09-25 DIAGNOSIS — Z4682 Encounter for fitting and adjustment of non-vascular catheter: Secondary | ICD-10-CM | POA: Diagnosis not present

## 2023-09-25 DIAGNOSIS — K219 Gastro-esophageal reflux disease without esophagitis: Secondary | ICD-10-CM | POA: Diagnosis present

## 2023-09-25 DIAGNOSIS — Z8673 Personal history of transient ischemic attack (TIA), and cerebral infarction without residual deficits: Secondary | ICD-10-CM

## 2023-09-25 DIAGNOSIS — N411 Chronic prostatitis: Secondary | ICD-10-CM | POA: Diagnosis present

## 2023-09-25 DIAGNOSIS — Z833 Family history of diabetes mellitus: Secondary | ICD-10-CM

## 2023-09-25 DIAGNOSIS — G8929 Other chronic pain: Secondary | ICD-10-CM | POA: Diagnosis present

## 2023-09-25 DIAGNOSIS — Z7902 Long term (current) use of antithrombotics/antiplatelets: Secondary | ICD-10-CM

## 2023-09-25 DIAGNOSIS — I10 Essential (primary) hypertension: Secondary | ICD-10-CM | POA: Diagnosis not present

## 2023-09-25 DIAGNOSIS — D72829 Elevated white blood cell count, unspecified: Secondary | ICD-10-CM | POA: Diagnosis not present

## 2023-09-25 DIAGNOSIS — I129 Hypertensive chronic kidney disease with stage 1 through stage 4 chronic kidney disease, or unspecified chronic kidney disease: Secondary | ICD-10-CM | POA: Diagnosis not present

## 2023-09-25 DIAGNOSIS — K5730 Diverticulosis of large intestine without perforation or abscess without bleeding: Secondary | ICD-10-CM | POA: Diagnosis not present

## 2023-09-25 DIAGNOSIS — R109 Unspecified abdominal pain: Secondary | ICD-10-CM | POA: Diagnosis not present

## 2023-09-25 DIAGNOSIS — Z96641 Presence of right artificial hip joint: Secondary | ICD-10-CM | POA: Diagnosis present

## 2023-09-25 DIAGNOSIS — K2970 Gastritis, unspecified, without bleeding: Secondary | ICD-10-CM | POA: Diagnosis present

## 2023-09-25 DIAGNOSIS — E119 Type 2 diabetes mellitus without complications: Secondary | ICD-10-CM | POA: Diagnosis not present

## 2023-09-25 DIAGNOSIS — Z882 Allergy status to sulfonamides status: Secondary | ICD-10-CM

## 2023-09-25 DIAGNOSIS — R079 Chest pain, unspecified: Secondary | ICD-10-CM | POA: Diagnosis not present

## 2023-09-25 DIAGNOSIS — Z79899 Other long term (current) drug therapy: Secondary | ICD-10-CM

## 2023-09-25 DIAGNOSIS — K5900 Constipation, unspecified: Secondary | ICD-10-CM | POA: Diagnosis not present

## 2023-09-25 DIAGNOSIS — Z7189 Other specified counseling: Secondary | ICD-10-CM | POA: Diagnosis not present

## 2023-09-25 DIAGNOSIS — N281 Cyst of kidney, acquired: Secondary | ICD-10-CM | POA: Diagnosis not present

## 2023-09-25 DIAGNOSIS — N183 Chronic kidney disease, stage 3 unspecified: Secondary | ICD-10-CM | POA: Diagnosis present

## 2023-09-25 DIAGNOSIS — K567 Ileus, unspecified: Secondary | ICD-10-CM | POA: Diagnosis not present

## 2023-09-25 DIAGNOSIS — E871 Hypo-osmolality and hyponatremia: Secondary | ICD-10-CM | POA: Diagnosis present

## 2023-09-25 DIAGNOSIS — K5669 Other partial intestinal obstruction: Secondary | ICD-10-CM | POA: Diagnosis not present

## 2023-09-25 DIAGNOSIS — I959 Hypotension, unspecified: Secondary | ICD-10-CM | POA: Diagnosis not present

## 2023-09-25 DIAGNOSIS — M791 Myalgia, unspecified site: Secondary | ICD-10-CM | POA: Diagnosis present

## 2023-09-25 DIAGNOSIS — I700 Atherosclerosis of aorta: Secondary | ICD-10-CM | POA: Diagnosis not present

## 2023-09-25 DIAGNOSIS — R1111 Vomiting without nausea: Secondary | ICD-10-CM | POA: Diagnosis not present

## 2023-09-25 LAB — URINALYSIS, ROUTINE W REFLEX MICROSCOPIC
Bacteria, UA: NONE SEEN
Bilirubin Urine: NEGATIVE
Glucose, UA: 50 mg/dL — AB
Ketones, ur: NEGATIVE mg/dL
Nitrite: NEGATIVE
Protein, ur: 30 mg/dL — AB
Specific Gravity, Urine: 1.017 (ref 1.005–1.030)
WBC, UA: >50 WBC/hpf (ref 0–5)
pH: 5 (ref 5.0–8.0)

## 2023-09-25 LAB — BASIC METABOLIC PANEL
Anion gap: 12 (ref 5–15)
BUN: 47 mg/dL — ABNORMAL HIGH (ref 8–23)
CO2: 19 mmol/L — ABNORMAL LOW (ref 22–32)
Calcium: 8.6 mg/dL — ABNORMAL LOW (ref 8.9–10.3)
Chloride: 96 mmol/L — ABNORMAL LOW (ref 98–111)
Creatinine, Ser: 3.69 mg/dL — ABNORMAL HIGH (ref 0.61–1.24)
GFR, Estimated: 16 mL/min — ABNORMAL LOW (ref >60)
Glucose, Bld: 132 mg/dL — ABNORMAL HIGH (ref 70–99)
Potassium: 4.6 mmol/L (ref 3.5–5.1)
Sodium: 127 mmol/L — ABNORMAL LOW (ref 135–145)

## 2023-09-25 LAB — CBC WITH DIFFERENTIAL/PLATELET
Abs Immature Granulocytes: 0.07 10*3/uL (ref 0.00–0.07)
Basophils Absolute: 0 10*3/uL (ref 0.0–0.1)
Basophils Relative: 0 %
Eosinophils Absolute: 0 10*3/uL (ref 0.0–0.5)
Eosinophils Relative: 0 %
HCT: 44.8 % (ref 39.0–52.0)
Hemoglobin: 15.4 g/dL (ref 13.0–17.0)
Immature Granulocytes: 0 %
Lymphocytes Relative: 7 %
Lymphs Abs: 1.2 10*3/uL (ref 0.7–4.0)
MCH: 29.7 pg (ref 26.0–34.0)
MCHC: 34.4 g/dL (ref 30.0–36.0)
MCV: 86.3 fL (ref 80.0–100.0)
Monocytes Absolute: 0.8 10*3/uL (ref 0.1–1.0)
Monocytes Relative: 5 %
Neutro Abs: 13.9 10*3/uL — ABNORMAL HIGH (ref 1.7–7.7)
Neutrophils Relative %: 88 %
Platelets: 307 10*3/uL (ref 150–400)
RBC: 5.19 MIL/uL (ref 4.22–5.81)
RDW: 14.9 % (ref 11.5–15.5)
WBC: 16 10*3/uL — ABNORMAL HIGH (ref 4.0–10.5)
nRBC: 0 % (ref 0.0–0.2)

## 2023-09-25 LAB — CBC
HCT: 45 % (ref 39.0–52.0)
Hemoglobin: 15.6 g/dL (ref 13.0–17.0)
MCH: 30.1 pg (ref 26.0–34.0)
MCHC: 34.7 g/dL (ref 30.0–36.0)
MCV: 86.7 fL (ref 80.0–100.0)
Platelets: 306 10*3/uL (ref 150–400)
RBC: 5.19 MIL/uL (ref 4.22–5.81)
RDW: 14.8 % (ref 11.5–15.5)
WBC: 10.3 10*3/uL (ref 4.0–10.5)
nRBC: 0 % (ref 0.0–0.2)

## 2023-09-25 LAB — COMPREHENSIVE METABOLIC PANEL
ALT: 10 U/L (ref 0–44)
AST: 17 U/L (ref 15–41)
Albumin: 3.7 g/dL (ref 3.5–5.0)
Alkaline Phosphatase: 105 U/L (ref 38–126)
Anion gap: 14 (ref 5–15)
BUN: 48 mg/dL — ABNORMAL HIGH (ref 8–23)
CO2: 18 mmol/L — ABNORMAL LOW (ref 22–32)
Calcium: 9.1 mg/dL (ref 8.9–10.3)
Chloride: 95 mmol/L — ABNORMAL LOW (ref 98–111)
Creatinine, Ser: 4.19 mg/dL — ABNORMAL HIGH (ref 0.61–1.24)
GFR, Estimated: 14 mL/min — ABNORMAL LOW (ref >60)
Glucose, Bld: 188 mg/dL — ABNORMAL HIGH (ref 70–99)
Potassium: 5 mmol/L (ref 3.5–5.1)
Sodium: 127 mmol/L — ABNORMAL LOW (ref 135–145)
Total Bilirubin: 0.9 mg/dL (ref <1.2)
Total Protein: 6.9 g/dL (ref 6.5–8.1)

## 2023-09-25 LAB — SODIUM, URINE, RANDOM: Sodium, Ur: 34 mmol/L

## 2023-09-25 LAB — CREATININE, URINE, RANDOM: Creatinine, Urine: 186 mg/dL

## 2023-09-25 LAB — I-STAT CG4 LACTIC ACID, ED: Lactic Acid, Venous: 2 mmol/L (ref 0.5–1.9)

## 2023-09-25 LAB — MAGNESIUM: Magnesium: 2 mg/dL (ref 1.7–2.4)

## 2023-09-25 LAB — TROPONIN I (HIGH SENSITIVITY): Troponin I (High Sensitivity): 12 ng/L (ref <18)

## 2023-09-25 MED ORDER — SODIUM CHLORIDE 0.9 % IV SOLN
2.0000 g | INTRAVENOUS | Status: DC
  Administered 2023-09-25: 2 g via INTRAVENOUS
  Filled 2023-09-25: qty 20

## 2023-09-25 MED ORDER — SODIUM BICARBONATE 8.4 % IV SOLN
INTRAVENOUS | Status: AC
  Filled 2023-09-25 (×3): qty 150

## 2023-09-25 MED ORDER — ONDANSETRON HCL 4 MG/2ML IJ SOLN
4.0000 mg | Freq: Once | INTRAMUSCULAR | Status: AC
  Administered 2023-09-25: 4 mg via INTRAVENOUS
  Filled 2023-09-25: qty 2

## 2023-09-25 MED ORDER — LIDOCAINE HCL 4 % EX SOLN: Freq: Two times a day (BID) | CUTANEOUS | Status: DC | PRN

## 2023-09-25 MED ORDER — PANTOPRAZOLE SODIUM 40 MG IV SOLR
40.0000 mg | Freq: Every day | INTRAVENOUS | Status: DC
  Administered 2023-09-25 – 2023-09-28 (×4): 40 mg via INTRAVENOUS
  Filled 2023-09-25 (×4): qty 10

## 2023-09-25 MED ORDER — DIATRIZOATE MEGLUMINE & SODIUM 66-10 % PO SOLN
90.0000 mL | Freq: Once | ORAL | Status: AC
  Administered 2023-09-25: 90 mL via NASOGASTRIC
  Filled 2023-09-25: qty 90

## 2023-09-25 MED ORDER — ACETAMINOPHEN 325 MG PO TABS: 650.0000 mg | ORAL_TABLET | Freq: Four times a day (QID) | ORAL | Status: DC | PRN

## 2023-09-25 MED ORDER — ONDANSETRON 4 MG PO TBDP
4.0000 mg | ORAL_TABLET | Freq: Once | ORAL | Status: DC
  Filled 2023-09-25: qty 1

## 2023-09-25 MED ORDER — ACETAMINOPHEN 650 MG RE SUPP: 650.0000 mg | Freq: Four times a day (QID) | RECTAL | Status: DC | PRN

## 2023-09-25 MED ORDER — HEPARIN SODIUM (PORCINE) 5000 UNIT/ML IJ SOLN
5000.0000 [IU] | Freq: Three times a day (TID) | INTRAMUSCULAR | Status: DC
  Administered 2023-09-26 – 2023-10-11 (×47): 5000 [IU] via SUBCUTANEOUS
  Filled 2023-09-25 (×47): qty 1

## 2023-09-25 MED ORDER — SODIUM CHLORIDE 0.9% FLUSH
3.0000 mL | Freq: Two times a day (BID) | INTRAVENOUS | Status: DC
  Administered 2023-09-25 – 2023-09-27 (×5): 3 mL via INTRAVENOUS

## 2023-09-25 MED ORDER — SODIUM CHLORIDE 0.9 % IV BOLUS
1000.0000 mL | Freq: Once | INTRAVENOUS | Status: AC
  Administered 2023-09-25: 1000 mL via INTRAVENOUS

## 2023-09-25 MED ORDER — HYDROMORPHONE HCL 1 MG/ML IJ SOLN: 0.5000 mg | INTRAMUSCULAR | Status: DC | PRN

## 2023-09-25 MED ORDER — MORPHINE SULFATE (PF) 4 MG/ML IV SOLN
4.0000 mg | Freq: Once | INTRAVENOUS | Status: AC
  Administered 2023-09-25: 4 mg via INTRAVENOUS
  Filled 2023-09-25: qty 1

## 2023-09-25 MED ORDER — POLYETHYLENE GLYCOL 3350 17 G PO PACK: 17.0000 g | PACK | Freq: Every day | ORAL | Status: DC | PRN

## 2023-09-25 MED ORDER — METRONIDAZOLE 500 MG/100ML IV SOLN
500.0000 mg | Freq: Two times a day (BID) | INTRAVENOUS | Status: DC
  Administered 2023-09-25 – 2023-09-26 (×2): 500 mg via INTRAVENOUS
  Filled 2023-09-25 (×2): qty 100

## 2023-09-25 NOTE — ED Notes (Signed)
Pt. I-stat CG4 Lactic Acid result 2.0, MD., Charmayne Sheer made aware.

## 2023-09-25 NOTE — ED Provider Notes (Signed)
[place] EMERGENCY DEPARTMENT AT [facility] Provider Note   CSN: [id number] Arrival date & time: 09/25/23  1501     History  Chief Complaint  Patient presents with   Emesis    Antonio Serrano is a 77 y.o. male with a history of TIA, stage III kidney disease, diabetes mellitus, and hypertension who presents the ED today via EMS for vomiting.  Patient reports that he again feeling nauseous and was vomiting yesterday afternoon until about 9 PM last night.  He states that he has been feeling some nausea since but has not thrown up today.  Additionally, he endorses lower abdominal pain that began last night as well which has persisted to today with associated dysuria.  He states that he has not urinated since last night despite drinking plenty of fluids this morning.  He denies fevers, diarrhea or constipation, or back pain.  No recent sick contact.  He denies any recent new foods.  No additional complaints or concerns at this time.    Home Medications Prior to Admission medications   Medication Sig Start Date End Date Taking? Authorizing Provider  amLODipine (NORVASC) 2.5 MG tablet Take 1 tablet (2.5 mg total) by mouth daily. 06/10/23  Yes [name], [name], MD  clopidogrel (PLAVIX) 75 MG tablet Take 1 tablet (75 mg total) by mouth daily. 05/14/23  Yes [name], [name], MD  ipratropium (ATROVENT) 0.03 % nasal spray USE 2 SPRAY(S) IN EACH NOSTRIL EVERY 12 HOURS Patient taking differently: Place 2 sprays into both nostrils every 12 (twelve) hours as needed for rhinitis. 09/13/21  Yes [name], [name], MD  MYRBETRIQ 50 MG TB24 tablet Take 1 tablet by mouth once daily 09/20/23  Yes [name], [name], MD  pantoprazole (PROTONIX) 40 MG tablet Take 1 tablet (40 mg total) by mouth 2 (two) times daily before a meal. Patient taking differently: Take 40 mg by mouth daily before breakfast. 06/19/23  Yes Marinda Elk, MD  polyethylene glycol (MIRALAX / GLYCOLAX) 17 g packet Take 17 g by  mouth daily as needed for mild constipation. Patient taking differently: Take 8.5 g by mouth daily as needed for mild constipation (HOLD FOR DIARRHEA). 04/23/23  Yes Pokhrel, Rebekah Chesterfield, MD  sodium chloride 1 g tablet Take 1 tablet (1 g total) by mouth 2 (two) times daily with a meal. 08/06/23  Yes [name], [name], MD  ciprofloxacin (CIPRO) 250 MG tablet Take 1 tablet (250 mg total) by mouth 2 (two) times daily. Patient not taking: Reported on 09/25/2023 08/09/23   Jeoffrey Massed, MD  sucralfate (CARAFATE) 1 GM/10ML suspension Take 10 mLs (1 g total) by mouth 4 (four) times daily -  with meals and at bedtime. Patient not taking: Reported on 09/25/2023 06/19/23 09/25/23  Marinda Elk, MD  traZODone (DESYREL) 50 MG tablet TAKE 1 TABLET BY MOUTH EVERY DAY AT BEDTIME AS NEEDED FOR SLEEP Patient not taking: Reported on 07/30/2023 05/14/23   [name], [name], MD      Allergies    Bactrim [sulfamethoxazole-trimethoprim]    Review of Systems   Review of Systems  Gastrointestinal:  Positive for vomiting.  All other systems reviewed and are negative.   Physical Exam Updated Vital Signs BP 137/85 (BP Location: Right Arm)   Pulse 82   Temp 98.3 F (36.8 C) (Oral)   Resp 14   Ht 5\' 6"  (1.676 m)   Wt 72.6 kg   SpO2 99%   BMI 25.82 kg/m  Physical Exam Vitals  and nursing note reviewed.  Constitutional:      General: He is not in acute distress.    Appearance: Normal appearance.  HENT:     Head: Normocephalic and atraumatic.     Mouth/Throat:     Mouth: Mucous membranes are moist.  Eyes:     Conjunctiva/sclera: Conjunctivae normal.     Pupils: Pupils are equal, round, and reactive to light.  Cardiovascular:     Rate and Rhythm: Normal rate and regular rhythm.     Pulses: Normal pulses.     Heart sounds: Normal heart sounds.  Pulmonary:     Effort: Pulmonary effort is normal.     Breath sounds: Normal breath sounds.  Abdominal:     General: There is no distension.      Palpations: Abdomen is soft.     Tenderness: There is abdominal tenderness. There is no right CVA tenderness, left CVA tenderness, guarding or rebound.     Comments: Suprapubic tenderness to palpation without guarding or rebound.  No CVA tenderness.  Musculoskeletal:        General: Normal range of motion.  Skin:    General: Skin is warm and dry.     Findings: No rash.  Neurological:     General: No focal deficit present.     Mental Status: He is alert.     Sensory: No sensory deficit.     Motor: No weakness.  Psychiatric:        Mood and Affect: Mood normal.        Behavior: Behavior normal.    ED Results / Procedures / Treatments   Labs (all labs ordered are listed, but only abnormal results are displayed) Labs Reviewed  CBC WITH DIFFERENTIAL/PLATELET - Abnormal; Notable for the following components:      Result Value   WBC 16.0 (*)    Neutro Abs 13.9 (*)    All other components within normal limits  URINALYSIS, ROUTINE W REFLEX MICROSCOPIC - Abnormal; Notable for the following components:   Color, Urine AMBER (*)    APPearance CLOUDY (*)    Glucose, UA 50 (*)    Hgb urine dipstick SMALL (*)    Protein, ur 30 (*)    Leukocytes,Ua MODERATE (*)    All other components within normal limits  COMPREHENSIVE METABOLIC PANEL - Abnormal; Notable for the following components:   Sodium 127 (*)    Chloride 95 (*)    CO2 18 (*)    Glucose, Bld 188 (*)    BUN 48 (*)    Creatinine, Ser 4.19 (*)    GFR, Estimated 14 (*)    All other components within normal limits  URINE CULTURE  CULTURE, BLOOD (ROUTINE X 2)  CULTURE, BLOOD (ROUTINE X 2)  MAGNESIUM  SODIUM, URINE, RANDOM  OSMOLALITY  CREATININE, URINE, RANDOM  APTT  PROTIME-INR  CBC  BASIC METABOLIC PANEL  CBC  BASIC METABOLIC PANEL  I-STAT CHEM 8, ED  I-STAT CG4 LACTIC ACID, ED  TROPONIN I (HIGH SENSITIVITY)    EKG EKG Interpretation Date/Time:  Wednesday September 25 2023 15:34:22 EST Ventricular Rate:  87 PR  Interval:  274 QRS Duration:  91 QT Interval:  372 QTC Calculation: 448 R Axis:   -1  Text Interpretation: Sinus or ectopic atrial rhythm Supraventricular bigeminy Prolonged PR interval No significant change since last tracing Confirmed by Richardean Canal [phone]) on 09/25/2023 4:12:22 PM  Radiology DG Abd Portable 1V-Small Bowel Protocol-Position Verification Result Date: 09/25/2023 CLINICAL DATA:  Enteric tube placement EXAM: PORTABLE ABDOMEN - 1 VIEW COMPARISON:  CT abdomen dated 09/25/2023 FINDINGS: Gastric/enteric tube tip projects over the stomach with side hole projecting over the gastroesophageal junction. Partially imaged dilated small bowel throughout the abdomen. IMPRESSION: Gastric/enteric tube tip projects over the stomach with side hole projecting over the gastroesophageal junction. Recommend advancing by at least 5 cm for more optimal positioning. Electronically Signed   By: Agustin Cree M.D.   On: 09/25/2023 20:39   CT ABDOMEN PELVIS WO CONTRAST Result Date: 09/25/2023 CLINICAL DATA:  Nausea, vomiting.  Abdominal pain. EXAM: CT ABDOMEN AND PELVIS WITHOUT CONTRAST TECHNIQUE: Multidetector CT imaging of the abdomen and pelvis was performed following the standard protocol without IV contrast. RADIATION DOSE REDUCTION: This exam was performed according to the departmental dose-optimization program which includes automated exposure control, adjustment of the mA and/or kV according to patient size and/or use of iterative reconstruction technique. COMPARISON:  05/12/2023 FINDINGS: Lower chest: Dependent atelectasis in the lower lobes. Coronary artery and aortic atherosclerosis. Hepatobiliary: No focal hepatic abnormality. Gallbladder unremarkable. Pancreas: No focal abnormality or ductal dilatation. Spleen: No focal abnormality.  Normal size. Adrenals/Urinary Tract: Adrenal glands unremarkable. Severely atrophic right kidney. Stable cyst in the upper pole of the right kidney and cyst in the mid to  lower pole of the left kidney. No follow-up imaging recommended. No stones or hydronephrosis. Urinary bladder unremarkable. Stomach/Bowel: Dilated fluid-filled small bowel loops into the pelvis. Distal small bowel is decompressed. Findings compatible with distal small bowel obstruction. Exact transition point not visualized. Stomach and large bowel unremarkable. Vascular/Lymphatic: Aortic atherosclerosis. No evidence of aneurysm or adenopathy. Reproductive: No visible focal abnormality. Other: No free fluid or free air. Musculoskeletal: No acute bony abnormality. IMPRESSION: Dilated fluid-filled small bowel loops into the pelvis. Distal small bowel decompressed. Findings compatible with distal small bowel obstruction. Aortic atherosclerosis, coronary artery disease. Dependent atelectasis in the lower lobes. Electronically Signed   By: Charlett Nose M.D.   On: 09/25/2023 19:44    Procedures Procedures: not indicated.   Medications Ordered in ED Medications  cefTRIAXone (ROCEPHIN) 2 g in sodium chloride 0.9 % 100 mL IVPB (has no administration in time range)  metroNIDAZOLE (FLAGYL) IVPB 500 mg (has no administration in time range)  pantoprazole (PROTONIX) injection 40 mg (has no administration in time range)  sodium bicarbonate 150 mEq in dextrose 5 % 1,150 mL infusion (has no administration in time range)  diatrizoate meglumine-sodium (GASTROGRAFIN) 66-10 % solution 90 mL (has no administration in time range)  acetaminophen (TYLENOL) tablet 650 mg (has no administration in time range)    Or  acetaminophen (TYLENOL) suppository 650 mg (has no administration in time range)  polyethylene glycol (MIRALAX / GLYCOLAX) packet 17 g (has no administration in time range)  heparin injection 5,000 Units (has no administration in time range)  sodium chloride flush (NS) 0.9 % injection 3 mL (has no administration in time range)  HYDROmorphone (DILAUDID) injection 0.5 mg (has no administration in time range)   lidocaine (XYLOCAINE) 4 % external solution (has no administration in time range)  morphine (PF) 4 MG/ML injection 4 mg (4 mg Intravenous Given 09/25/23 1629)  ondansetron (ZOFRAN) injection 4 mg (4 mg Intravenous Given 09/25/23 1629)  sodium chloride 0.9 % bolus 1,000 mL (0 mLs Intravenous Stopped 09/25/23 1845)  sodium chloride 0.9 % bolus 1,000 mL (1,000 mLs Intravenous New Bag/Given 09/25/23 1845)    ED Course/ Medical Decision Making/ A&P  Medical Decision Making Amount and/or Complexity of Data Reviewed Labs: ordered. Radiology: ordered.  Risk Prescription drug management. Decision regarding hospitalization.   This patient presents to the ED for concern of emesis, this involves an extensive number of treatment options, and is a complaint that carries with it a high risk of complications and morbidity.   Differential diagnosis includes: gastritis, gastroenteritis, IBS, IBD, bowel obstruction, volvulus, perforation, UTI, etc.   Comorbidities  See HPI above   Additional History  Additional history obtained from prior records.   Cardiac Monitoring / EKG  The patient was maintained on a cardiac monitor.  I personally viewed and interpreted the cardiac monitored which showed: Sinus or ectopic atrial rhythm with a heart rate of 87 bpm.   Lab Tests  I ordered and personally interpreted labs.  The pertinent results include:   WBC of 16.0 Sodium of 127, 48, creatinine of 4.19, GFR 14 Contaminated urine specimen - moderate leukocytes, hematuria with 11-20 RBC/HPF. Culture pending.   Imaging Studies  Bedside ultrasound performed by my attending, Dr. Silverio Lay, shows 208 mL urine in the bladder. I ordered imaging studies including CT abdomen/pelvis  I independently visualized and interpreted imaging which showed: dilated fluid-filled small bowl loops into the pelvis. Distal small bowel decompressed. Findings compatible with distal small bowel  obstruction. Abdominal x-ray was ordered after NG tube was placed.  Radiologist recommended to be lowered 5 cm. Tube was then moved. I agree with the radiologist interpretation   Consultations  I requested consultation with Dr. Maryjean Ka with [facility],  and discussed lab and imaging findings as well as pertinent plan - they recommend: admit patient for further evaluation and management. I requested consultation with Dr. Derrell Lolling with general surgery,  and discussed lab and imaging findings as well as pertinent plan - they recommend: they will consult and see patient in the morning.   Problem List / ED Course / Critical Interventions / Medication Management  Nausea and vomiting since yesterday Patient's son and daughter-in-law were at bedside on reevaluation. They said that he was having intermittent hiccups since the vomiting started as well. I ordered medications including: Morphine for abdominal pain Zofran for nausea NS for AKI  Reevaluation of the patient after these medicines showed that the patient improved. NG tube placed after imaging showed SBO. Fluid immediately came out and patient reported feeling better. I have reviewed the patients home medicines and have made adjustments as needed I called patient's son and daughter-in-law and updated them on the imaging findings.  All questions were answered.   Social Determinants of Health  Physical activity   Test / Admission - Considered  Discussed findings with patient.  He is agreeable with plan for admission.       Final Clinical Impression(s) / ED Diagnoses Final diagnoses:  Small bowel obstruction (HCC)  AKI (acute kidney injury) [facility])    Rx / DC Orders ED Discharge Orders     None         Maxwell Marion, PA-C 09/25/23 2140    Charlynne Pander, MD 09/25/23 2227

## 2023-09-25 NOTE — Assessment & Plan Note (Signed)
It is marked. Possible enteritis given presentation. Check lactate. Draw blood cultures and treat with ceftriaxoen and flagyl. Luekocytoes noted in urine. Will f/u urine cultures. Check CXR.

## 2023-09-25 NOTE — Assessment & Plan Note (Addendum)
On CKD in the setting of marked h/o vomtiing. A.w. normal anion gap acidosis metablic. Check baldder scan. Moinitor I/o. S.p 2 liter fluid in er. Will treat with furhter sodium bicarbonate infusion. Trend creatinine.

## 2023-09-25 NOTE — H&P (Signed)
History and Physical    Patient: Antonio Serrano EAV:[id number] DOB: [birth year] DOA: 09/25/2023 DOS: the patient was seen and examined on 09/25/2023 PCP: Jeoffrey Massed, MD  Patient coming from: Home  Chief Complaint:  Chief Complaint  Patient presents with   Emesis   HPI: Antonio Serrano is a 77 y.o. male with medical history significant of no reported intra-abdominal surgery.  Patient was in his usual state of health till approximately day before yesterday when he reports some mild onset of crampy abdominal pain and more than 10 episodes of vomiting.  Patient reports that the episodes of vomiting continued yesterday as well.  But they ceased today.  Patient has been unable to tolerate p.o. diet however because of nausea.  Patient denies any fever or diarrhea.  Patient reports that he was actually passing gas last night.  However currently not passing gas.  Patient still has mild lower abdominal crampy discomfort/pain.  Patient came to the ER at the insistence of his wife.  Patient denies any similar episodes in the past.  In the ER patient's CAT scan is concerning for small bowel obstruction, patient has had nasogastric tube placed with about 850 cc of dark brown/feculent aspiration thus far.  Patient has received antiemetics and fluids.  Medical evaluation is sought.  Patient denies any trouble urinating   Review of Systems: As mentioned in the history of present illness. All other systems reviewed and are negative. Past Medical History:  Diagnosis Date   Atherosclerosis of coronary artery    On chest CT->calcif in LAD and RCA   Cerebrovascular disease    03/2023 MRI brain showed chronic microvasc isch changes and small remote infarcts   Chronic low back pain    MR 11/2021 showed L4-5 foraminal stenosis--> plan per Ortho was to do L4-5 injection   Chronic prostatitis    Very mild elevation of PSA (>4), referred to [facility] where PSA repeat was 1.0 on 06/16/13.  Annual PSA repeat is all that  is needed now per urologist.  Most recent 07/2015 was 0.45.   Chronic renal insufficiency, stage III (moderate) (HCC)    Baseline GFR as of 2019= 50s.     Closed right hip fracture (HCC) 03/2021   THA   Diabetes mellitus with complication (HCC) Dx'd fall 2011   HTN (hypertension)    Renal/aortic doppler u/s normal 06/2010   Hypercalcemia    Hyperlipidemia 2016   Statin intolerant--myalgias.  Pt refuses any further trial of statin as of 2018.   Nonspecific abnormal electrocardiogram (ECG) (EKG) Fall 2011   TWI in inferior leads: myocardial perfusion scan neg and echo normal 07/2010   Pseudomonas aeruginosa colonization    12/2021 urine clx   TIA (transient ischemic attack)    12/2021- CT angio head/neck no high grade stenosis.  MR showed old lacunar infarcts corona radiata, basal ganglia, thalamus.  Echo normal.   Tobacco dependence    UTI (lower urinary tract infection)    Feb 2012 (klebsiella--dx at [facility]); 03/2013 e coli.    Past Surgical History:  Procedure Laterality Date   BIOPSY  06/16/2023   Procedure: BIOPSY;  Surgeon: Meridee Score Netty Starring., MD;  Location: [facility];  Service: Gastroenterology;;   CARDIOVASCULAR STRESS TEST  07/01/2010   Myocardial perfusion scan neg/low risk.   CATARACT EXTRACTION  2007, 2008   Bilateral ([facility] on [address].   COLONOSCOPY  12/20/04;12/2014   2016 no polyps: recall 5 yrs due to FH of colon ca   ESOPHAGOGASTRODUODENOSCOPY  11/29/2004   esoph stricture/dilation   ESOPHAGOGASTRODUODENOSCOPY N/A 06/16/2023   Procedure: ESOPHAGOGASTRODUODENOSCOPY (EGD);  Surgeon: Lemar Lofty., MD;  Location: [facility];  Service: Gastroenterology;  Laterality: N/A;   SAVORY DILATION N/A 06/16/2023   Procedure: SAVORY DILATION;  Surgeon: Meridee Score Netty Starring., MD;  Location: [facility];  Service: Gastroenterology;  Laterality: N/A;   TOTAL HIP ARTHROPLASTY Right 04/23/2021   Procedure: TOTAL HIP ARTHROPLASTY ANTERIOR  APPROACH;  Surgeon: Cammy Copa, MD;  Location: [facility];  Service: Orthopedics;  Laterality: Right;  Hana, C-arm, Depuy   TRANSTHORACIC ECHOCARDIOGRAM  07/01/2010   2011 EF =>55%; LA mildly dilated; trace MR/TR.  12/2021 EF 60-65%, DD indeterm, valves nl.   Social History:  reports that he has been smoking cigarettes. He has a 12.5 pack-year smoking history. He has never used smokeless tobacco. He reports that he does not currently use alcohol. He reports that he does not use drugs.  Allergies  Allergen Reactions   Bactrim [Sulfamethoxazole-Trimethoprim] Nausea Only    Family History  Problem Relation Age of Onset   Dementia Mother    Pneumonia Father        died in 17's of pneumonia, was otherwise healthy   Cancer Sister        colon cancer.  Died age 57   Diabetes Paternal Aunt    Diabetes Sister    Colon cancer Neg Hx     Prior to Admission medications   Medication Sig Start Date End Date Taking? Authorizing Provider  acetaminophen (TYLENOL) 500 MG tablet Take 1,000 mg by mouth as needed for moderate pain.    [provider]  amLODipine (NORVASC) 2.5 MG tablet Take 1 tablet (2.5 mg total) by mouth daily. 06/10/23   [name], [name], MD  ciprofloxacin (CIPRO) 250 MG tablet Take 1 tablet (250 mg total) by mouth 2 (two) times daily. 08/09/23   [name], [name], MD  clopidogrel (PLAVIX) 75 MG tablet Take 1 tablet (75 mg total) by mouth daily. 05/14/23   [name], [name], MD  Docusate Calcium (STOOL SOFTENER PO) Take 1 tablet by mouth daily as needed (constipation).    [provider]  ipratropium (ATROVENT) 0.03 % nasal spray USE 2 SPRAY(S) IN EACH NOSTRIL EVERY 12 HOURS Patient taking differently: Place 1 spray into both nostrils as needed for rhinitis. 09/13/21   Jeoffrey Massed, MD  MYRBETRIQ 50 MG TB24 tablet Take 1 tablet by mouth once daily 09/20/23   [name], [name], MD  OVER THE COUNTER MEDICATION Take 1 tablet by mouth daily. Elderberry, zinc, and  vitamin C combo medication OTC.    [provider]  pantoprazole (PROTONIX) 40 MG tablet Take 1 tablet (40 mg total) by mouth 2 (two) times daily before a meal. 06/19/23   Marinda Elk, MD  polyethylene glycol (MIRALAX / GLYCOLAX) 17 g packet Take 17 g by mouth daily as needed for mild constipation. 04/23/23   Pokhrel, Rebekah Chesterfield, MD  sodium chloride 1 g tablet Take 1 tablet (1 g total) by mouth 2 (two) times daily with a meal. 08/06/23   [name], [name], MD  sucralfate (CARAFATE) 1 GM/10ML suspension Take 10 mLs (1 g total) by mouth 4 (four) times daily -  with meals and at bedtime. 06/19/23 07/19/23  Marinda Elk, MD  traZODone (DESYREL) 50 MG tablet TAKE 1 TABLET BY MOUTH EVERY DAY AT BEDTIME AS NEEDED FOR SLEEP Patient not taking: Reported on 07/30/2023 05/14/23   [name], [name], MD  Reports no recent medication changes. Physical Exam: Vitals:   09/25/23 1630 09/25/23 1700 09/25/23 1929 09/25/23 1932  BP: 129/78 118/86  137/85  Pulse: 82 85  82  Resp: 20 18  14   Temp:   98.3 F (36.8 C)   TempSrc:   Oral   SpO2: 98% 96%  99%  Weight:      Height:       General: Nasogastric tube in situ.  Patient does not appear distressed.  However has apparent fatigue.  Trap has recently been changed and is mostly empty Respiratory exam: Bilateral intravesicular Cardiovascular exam S1-S2 normal Abdomen: Bowel sounds are not heard/markedly sluggish all quadrants are soft nontender Extremities warm without edema no focal motor deficit. Data Reviewed:  Labs on Admission:  Results for orders placed or performed during the hospital encounter of 09/25/23 (from the past 24 hours)  CBC with Differential     Status: Abnormal   Collection Time: 09/25/23  3:30 PM  Result Value Ref Range   WBC 16.0 (H) 4.0 - 10.5 K/uL   RBC 5.19 4.22 - 5.81 MIL/uL   Hemoglobin 15.4 13.0 - 17.0 g/dL   HCT 16.1 09.6 - 04.5 %   MCV 86.3 80.0 - 100.0 fL   MCH 29.7 26.0 - 34.0 pg   MCHC 34.4 30.0 -  36.0 g/dL   RDW 40.9 81.1 - 91.4 %   Platelets 307 150 - 400 K/uL   nRBC 0.0 0.0 - 0.2 %   Neutrophils Relative % 88 %   Neutro Abs 13.9 (H) 1.7 - 7.7 K/uL   Lymphocytes Relative 7 %   Lymphs Abs 1.2 0.7 - 4.0 K/uL   Monocytes Relative 5 %   Monocytes Absolute 0.8 0.1 - 1.0 K/uL   Eosinophils Relative 0 %   Eosinophils Absolute 0.0 0.0 - 0.5 K/uL   Basophils Relative 0 %   Basophils Absolute 0.0 0.0 - 0.1 K/uL   Immature Granulocytes 0 %   Abs Immature Granulocytes 0.07 0.00 - 0.07 K/uL  Comprehensive metabolic panel     Status: Abnormal   Collection Time: 09/25/23  5:11 PM  Result Value Ref Range   Sodium 127 (L) 135 - 145 mmol/L   Potassium 5.0 3.5 - 5.1 mmol/L   Chloride 95 (L) 98 - 111 mmol/L   CO2 18 (L) 22 - 32 mmol/L   Glucose, Bld 188 (H) 70 - 99 mg/dL   BUN 48 (H) 8 - 23 mg/dL   Creatinine, Ser 7.82 (H) 0.61 - 1.24 mg/dL   Calcium 9.1 8.9 - 95.6 mg/dL   Total Protein 6.9 6.5 - 8.1 g/dL   Albumin 3.7 3.5 - 5.0 g/dL   AST 17 15 - 41 U/L   ALT 10 0 - 44 U/L   Alkaline Phosphatase 105 38 - 126 U/L   Total Bilirubin 0.9 <1.2 mg/dL   GFR, Estimated 14 (L) >60 mL/min   Anion gap 14 5 - 15  Urinalysis, Routine w reflex microscopic -Urine, Clean Catch     Status: Abnormal   Collection Time: 09/25/23  7:36 PM  Result Value Ref Range   Color, Urine AMBER (A) YELLOW   APPearance CLOUDY (A) CLEAR   Specific Gravity, Urine 1.017 1.005 - 1.030   pH 5.0 5.0 - 8.0   Glucose, UA 50 (A) NEGATIVE mg/dL   Hgb urine dipstick SMALL (A) NEGATIVE   Bilirubin Urine NEGATIVE NEGATIVE   Ketones, ur NEGATIVE NEGATIVE mg/dL   Protein, ur 30 (A) NEGATIVE  mg/dL   Nitrite NEGATIVE NEGATIVE   Leukocytes,Ua MODERATE (A) NEGATIVE   RBC / HPF 11-20 0 - 5 RBC/hpf   WBC, UA >50 0 - 5 WBC/hpf   Bacteria, UA NONE SEEN NONE SEEN   Squamous Epithelial / HPF 6-10 0 - 5 /HPF   Mucus PRESENT    Hyaline Casts, UA PRESENT    Sperm, UA PRESENT    Basic Metabolic Panel: Recent Labs  Lab  09/25/23 1711  NA 127*  K 5.0  CL 95*  CO2 18*  GLUCOSE 188*  BUN 48*  CREATININE 4.19*  CALCIUM 9.1   Liver Function Tests: Recent Labs  Lab 09/25/23 1711  AST 17  ALT 10  ALKPHOS 105  BILITOT 0.9  PROT 6.9  ALBUMIN 3.7   No results for input(s): "LIPASE", "AMYLASE" in the last 168 hours. No results for input(s): "AMMONIA" in the last 168 hours. CBC: Recent Labs  Lab 09/25/23 1530  WBC 16.0*  NEUTROABS 13.9*  HGB 15.4  HCT 44.8  MCV 86.3  PLT 307   Cardiac Enzymes: No results for input(s): "CKTOTAL", "CKMB", "CKMBINDEX", "TROPONINIHS" in the last 168 hours.  BNP (last 3 results) No results for input(s): "PROBNP" in the last 8760 hours. CBG: No results for input(s): "GLUCAP" in the last 168 hours.  Radiological Exams on Admission:  DG Abd Portable 1V-Small Bowel Protocol-Position Verification Result Date: 09/25/2023 CLINICAL DATA:  Enteric tube placement EXAM: PORTABLE ABDOMEN - 1 VIEW COMPARISON:  CT abdomen dated 09/25/2023 FINDINGS: Gastric/enteric tube tip projects over the stomach with side hole projecting over the gastroesophageal junction. Partially imaged dilated small bowel throughout the abdomen. IMPRESSION: Gastric/enteric tube tip projects over the stomach with side hole projecting over the gastroesophageal junction. Recommend advancing by at least 5 cm for more optimal positioning. Electronically Signed   By: Agustin Cree M.D.   On: 09/25/2023 20:39   CT ABDOMEN PELVIS WO CONTRAST Result Date: 09/25/2023 CLINICAL DATA:  Nausea, vomiting.  Abdominal pain. EXAM: CT ABDOMEN AND PELVIS WITHOUT CONTRAST TECHNIQUE: Multidetector CT imaging of the abdomen and pelvis was performed following the standard protocol without IV contrast. RADIATION DOSE REDUCTION: This exam was performed according to the departmental dose-optimization program which includes automated exposure control, adjustment of the mA and/or kV according to patient size and/or use of iterative  reconstruction technique. COMPARISON:  05/12/2023 FINDINGS: Lower chest: Dependent atelectasis in the lower lobes. Coronary artery and aortic atherosclerosis. Hepatobiliary: No focal hepatic abnormality. Gallbladder unremarkable. Pancreas: No focal abnormality or ductal dilatation. Spleen: No focal abnormality.  Normal size. Adrenals/Urinary Tract: Adrenal glands unremarkable. Severely atrophic right kidney. Stable cyst in the upper pole of the right kidney and cyst in the mid to lower pole of the left kidney. No follow-up imaging recommended. No stones or hydronephrosis. Urinary bladder unremarkable. Stomach/Bowel: Dilated fluid-filled small bowel loops into the pelvis. Distal small bowel is decompressed. Findings compatible with distal small bowel obstruction. Exact transition point not visualized. Stomach and large bowel unremarkable. Vascular/Lymphatic: Aortic atherosclerosis. No evidence of aneurysm or adenopathy. Reproductive: No visible focal abnormality. Other: No free fluid or free air. Musculoskeletal: No acute bony abnormality. IMPRESSION: Dilated fluid-filled small bowel loops into the pelvis. Distal small bowel decompressed. Findings compatible with distal small bowel obstruction. Aortic atherosclerosis, coronary artery disease. Dependent atelectasis in the lower lobes. Electronically Signed   By: Charlett Nose M.D.   On: 09/25/2023 19:44    EKG: Independently reviewed. NSR PAC   Assessment and Plan: * SBO (small bowel  obstruction) (HCC) See CT above. In the setting of no prior intra-abd surgery per aptient. Will c.w. NGT placed in ER. Abd benign to palpation at this time and patient pretty comfortable. Will cw. Fluids as mentioned in AKI. Surgical input has been sought be ER attending, with Dr. Derrell Lolling who will see patinet in AM. Keep NGT to low intermitent suction for now. Bowel sounds were not heard during author exam. So will wait for bowel function to return for now. Check magnesium  level.  AKI (acute kidney injury) (HCC) On CKD in the setting of marked h/o vomtiing. A.w. normal anion gap acidosis metablic. Check baldder scan. Moinitor I/o. S.p 2 liter fluid in er. Will treat with furhter sodium bicarbonate infusion. Trend creatinine.   Leukocytosis It is marked. Possible enteritis given presentation. Check lactate. Draw blood cultures and treat with ceftriaxoen and flagyl. Luekocytoes noted in urine. Will f/u urine cultures. Check CXR.  Hyponatremia Moderate chronic asymptaotmic. Check serum osm, urine sodium. Likely is prerenal. At this time patient needs fluid rescucitation, will trend.   Home medication reconciliation pending pharmacy input.    Advance Care Planning:   Code Status: Prior full code.  Consults: Dr. Derrell Lolling of Surgery.  Family Communication: per patient wife aware of hospitalisation.  Severity of Illness: The appropriate patient status for this patient is INPATIENT. Inpatient status is judged to be reasonable and necessary in order to provide the required intensity of service to ensure the patient's safety. The patient's presenting symptoms, physical exam findings, and initial radiographic and laboratory data in the context of their chronic comorbidities is felt to place them at high risk for further clinical deterioration. Furthermore, it is not anticipated that the patient will be medically stable for discharge from the hospital within 2 midnights of admission.   * I certify that at the point of admission it is my clinical judgment that the patient will require inpatient hospital care spanning beyond 2 midnights from the point of admission due to high intensity of service, high risk for further deterioration and high frequency of surveillance required.*  Author: Nolberto Hanlon, MD 09/25/2023 9:11 PM  For on call review [email or web address].

## 2023-09-25 NOTE — ED Notes (Signed)
ED TO INPATIENT HANDOFF REPORT  Name/Age/Gender Antonio Serrano 77 y.o. male  Code Status    Code Status Orders  (From admission, onward)           Start     Ordered   09/25/23 2128  Full code  Continuous       Question:  By:  Answer:  Consent: discussion documented in EHR   09/25/23 2127           Code Status History     Date Active Date Inactive Code Status Order ID Comments User Context   06/14/2023 1642 06/19/2023 1652 Full Code [id number]  Maryln Gottron, MD ED   04/19/2023 1611 04/24/2023 0045 Full Code [id number]  Nolberto Hanlon, MD ED   01/04/2022 1529 01/05/2022 2003 Full Code [id number]  Emeline General, MD ED   04/23/2021 0118 04/25/2021 1649 Full Code [id number]  Marlow Baars, MD ED       Home/SNF/Other Home  Chief Complaint SBO (small bowel obstruction) (HCC) [K56.609]  Level of Care/Admitting Diagnosis ED Disposition     ED Disposition  Admit   Condition  --   Comment  Hospital Area: [facility] [[id number]]  Level of Care: Telemetry [5]  Admit to tele based on following criteria: Monitor QTC interval  May admit patient to [facility] or [facility] if equivalent level of care is available:: No  Covid Evaluation: Asymptomatic - no recent exposure (last 10 days) testing not required  Diagnosis: SBO (small bowel obstruction) [facility]) [[id number]]  Admitting Physician: Nolberto Hanlon [[id number]]  Attending Physician: Nolberto Hanlon [[id number]]  Certification:: I certify this patient will need inpatient services for at least 2 midnights  Expected Medical Readiness: 09/27/2023          Medical History Past Medical History:  Diagnosis Date   Atherosclerosis of coronary artery    On chest CT->calcif in LAD and RCA   Cerebrovascular disease    03/2023 MRI brain showed chronic microvasc isch changes and small remote infarcts   Chronic low back pain    MR 11/2021 showed L4-5 foraminal stenosis--> plan per Ortho was to do L4-5 injection   Chronic  prostatitis    Very mild elevation of PSA (>4), referred to [facility] where PSA repeat was 1.0 on 06/16/13.  Annual PSA repeat is all that is needed now per urologist.  Most recent 07/2015 was 0.45.   Chronic renal insufficiency, stage III (moderate) (HCC)    Baseline GFR as of 2019= 50s.     Closed right hip fracture (HCC) 03/2021   THA   Diabetes mellitus with complication (HCC) Dx'd fall 2011   HTN (hypertension)    Renal/aortic doppler u/s normal 06/2010   Hypercalcemia    Hyperlipidemia 2016   Statin intolerant--myalgias.  Pt refuses any further trial of statin as of 2018.   Nonspecific abnormal electrocardiogram (ECG) (EKG) Fall 2011   TWI in inferior leads: myocardial perfusion scan neg and echo normal 07/2010   Pseudomonas aeruginosa colonization    12/2021 urine clx   TIA (transient ischemic attack)    12/2021- CT angio head/neck no high grade stenosis.  MR showed old lacunar infarcts corona radiata, basal ganglia, thalamus.  Echo normal.   Tobacco dependence    UTI (lower urinary tract infection)    Feb 2012 (klebsiella--dx at [facility]); 03/2013 e coli.     Allergies Allergies  Allergen Reactions   Bactrim [Sulfamethoxazole-Trimethoprim] Nausea Only    IV Location/Drains/Wounds Patient Lines/Drains/Airways Status  Active Line/Drains/Airways     Name Placement date Placement time Site Days   Peripheral IV 09/25/23 20 G Anterior;Left Forearm 09/25/23  --  Forearm  less than 1   Peripheral IV 09/25/23 20 G Anterior;Right Forearm 09/25/23  2132  Forearm  less than 1   NG/OG Vented/Dual Lumen 16 Fr. Right nare External length of tube 60 cm 09/25/23  2015  Right nare  less than 1            Labs/Imaging Results for orders placed or performed during the hospital encounter of 09/25/23 (from the past 48 hours)  CBC with Differential     Status: Abnormal   Collection Time: 09/25/23  3:30 PM  Result Value Ref Range   WBC 16.0 (H) 4.0 - 10.5 K/uL   RBC 5.19 4.22 - 5.81 MIL/uL    Hemoglobin 15.4 13.0 - 17.0 g/dL   HCT 81.1 91.4 - 78.2 %   MCV 86.3 80.0 - 100.0 fL   MCH 29.7 26.0 - 34.0 pg   MCHC 34.4 30.0 - 36.0 g/dL   RDW 95.6 21.3 - 08.6 %   Platelets 307 150 - 400 K/uL   nRBC 0.0 0.0 - 0.2 %   Neutrophils Relative % 88 %   Neutro Abs 13.9 (H) 1.7 - 7.7 K/uL   Lymphocytes Relative 7 %   Lymphs Abs 1.2 0.7 - 4.0 K/uL   Monocytes Relative 5 %   Monocytes Absolute 0.8 0.1 - 1.0 K/uL   Eosinophils Relative 0 %   Eosinophils Absolute 0.0 0.0 - 0.5 K/uL   Basophils Relative 0 %   Basophils Absolute 0.0 0.0 - 0.1 K/uL   Immature Granulocytes 0 %   Abs Immature Granulocytes 0.07 0.00 - 0.07 K/uL    Comment: Performed at [facility], [address], [place], [place] [address]  Comprehensive metabolic panel     Status: Abnormal   Collection Time: 09/25/23  5:11 PM  Result Value Ref Range   Sodium 127 (L) 135 - 145 mmol/L   Potassium 5.0 3.5 - 5.1 mmol/L   Chloride 95 (L) 98 - 111 mmol/L   CO2 18 (L) 22 - 32 mmol/L   Glucose, Bld 188 (H) 70 - 99 mg/dL    Comment: Glucose reference range applies only to samples taken after fasting for at least 8 hours.   BUN 48 (H) 8 - 23 mg/dL   Creatinine, Ser 9.62 (H) 0.61 - 1.24 mg/dL   Calcium 9.1 8.9 - 95.2 mg/dL   Total Protein 6.9 6.5 - 8.1 g/dL   Albumin 3.7 3.5 - 5.0 g/dL   AST 17 15 - 41 U/L   ALT 10 0 - 44 U/L   Alkaline Phosphatase 105 38 - 126 U/L   Total Bilirubin 0.9 <1.2 mg/dL   GFR, Estimated 14 (L) >60 mL/min    Comment: (NOTE) Calculated using the CKD-EPI Creatinine Equation (2021)    Anion gap 14 5 - 15    Comment: Performed at [facility], [address], [place], [place] [address]  Urinalysis, Routine w reflex microscopic -Urine, Clean Catch     Status: Abnormal   Collection Time: 09/25/23  7:36 PM  Result Value Ref Range   Color, Urine AMBER (A) YELLOW    Comment: BIOCHEMICALS MAY BE AFFECTED BY COLOR   APPearance CLOUDY (A) CLEAR   Specific Gravity,  Urine 1.017 1.005 - 1.030   pH 5.0 5.0 - 8.0   Glucose, UA 50 (  A) NEGATIVE mg/dL   Hgb urine dipstick SMALL (A) NEGATIVE   Bilirubin Urine NEGATIVE NEGATIVE   Ketones, ur NEGATIVE NEGATIVE mg/dL   Protein, ur 30 (A) NEGATIVE mg/dL   Nitrite NEGATIVE NEGATIVE   Leukocytes,Ua MODERATE (A) NEGATIVE   RBC / HPF 11-20 0 - 5 RBC/hpf   WBC, UA >50 0 - 5 WBC/hpf   Bacteria, UA NONE SEEN NONE SEEN   Squamous Epithelial / HPF 6-10 0 - 5 /HPF   Mucus PRESENT    Hyaline Casts, UA PRESENT    Sperm, UA PRESENT     Comment: Performed at [facility], [address], [place], [place] [address]  CBC     Status: None   Collection Time: 09/25/23  9:40 PM  Result Value Ref Range   WBC 10.3 4.0 - 10.5 K/uL   RBC 5.19 4.22 - 5.81 MIL/uL   Hemoglobin 15.6 13.0 - 17.0 g/dL   HCT 28.4 13.2 - 44.0 %   MCV 86.7 80.0 - 100.0 fL   MCH 30.1 26.0 - 34.0 pg   MCHC 34.7 30.0 - 36.0 g/dL   RDW 10.2 72.5 - 36.6 %   Platelets 306 150 - 400 K/uL   nRBC 0.0 0.0 - 0.2 %    Comment: Performed at [facility], [address] [place], [place] [address]  I-Stat CG4 Lactic Acid     Status: Abnormal   Collection Time: 09/25/23 10:02 PM  Result Value Ref Range   Lactic Acid, Venous 2.0 (HH) 0.5 - 1.9 mmol/L   DG CHEST PORT 1 VIEW Result Date: 09/25/2023 CLINICAL DATA:  NG tube positioning. EXAM: PORTABLE CHEST 1 VIEW COMPARISON:  June 14, 2023 FINDINGS: An enteric tube is seen with its distal tip noted within the body of the stomach. Its distal side hole is approximately 4.3 cm distal to the expected region of the gastroesophageal junction. The heart size and mediastinal contours are within normal limits. Very mild atelectasis and/or infiltrate is suspected within the lateral aspect of the right lung base. No pleural effusion or pneumothorax is identified. A chronic ninth right rib fracture is seen. IMPRESSION: 1. Enteric tube positioning, as described above. 2. Very mild right  basilar atelectasis and/or infiltrate. Electronically Signed   By: Aram Candela M.D.   On: 09/25/2023 22:02   DG Abd Portable 1V-Small Bowel Protocol-Position Verification Result Date: 09/25/2023 CLINICAL DATA:  Enteric tube placement EXAM: PORTABLE ABDOMEN - 1 VIEW COMPARISON:  Earlier same day abdominal radiograph at 8:25 p.m. FINDINGS: Interval advancement of the enteric tube with tip projecting over the left upper quadrant. The side hole now projects over the gastric cardia. Partially imaged small bowel dilation. IMPRESSION: Interval advancement of the enteric tube with tip projecting over the left upper quadrant. The side hole now projects over the gastric cardia. Further advancement can be considered. Electronically Signed   By: Agustin Cree M.D.   On: 09/25/2023 21:59   DG Abd Portable 1V-Small Bowel Protocol-Position Verification Result Date: 09/25/2023 CLINICAL DATA:  Enteric tube placement EXAM: PORTABLE ABDOMEN - 1 VIEW COMPARISON:  CT abdomen dated 09/25/2023 FINDINGS: Gastric/enteric tube tip projects over the stomach with side hole projecting over the gastroesophageal junction. Partially imaged dilated small bowel throughout the abdomen. IMPRESSION: Gastric/enteric tube tip projects over the stomach with side hole projecting over the gastroesophageal junction. Recommend advancing by at least 5 cm for more optimal positioning. Electronically Signed   By: Agustin Cree M.D.   On: 09/25/2023  20:39   CT ABDOMEN PELVIS WO CONTRAST Result Date: 09/25/2023 CLINICAL DATA:  Nausea, vomiting.  Abdominal pain. EXAM: CT ABDOMEN AND PELVIS WITHOUT CONTRAST TECHNIQUE: Multidetector CT imaging of the abdomen and pelvis was performed following the standard protocol without IV contrast. RADIATION DOSE REDUCTION: This exam was performed according to the departmental dose-optimization program which includes automated exposure control, adjustment of the mA and/or kV according to patient size and/or use of  iterative reconstruction technique. COMPARISON:  05/12/2023 FINDINGS: Lower chest: Dependent atelectasis in the lower lobes. Coronary artery and aortic atherosclerosis. Hepatobiliary: No focal hepatic abnormality. Gallbladder unremarkable. Pancreas: No focal abnormality or ductal dilatation. Spleen: No focal abnormality.  Normal size. Adrenals/Urinary Tract: Adrenal glands unremarkable. Severely atrophic right kidney. Stable cyst in the upper pole of the right kidney and cyst in the mid to lower pole of the left kidney. No follow-up imaging recommended. No stones or hydronephrosis. Urinary bladder unremarkable. Stomach/Bowel: Dilated fluid-filled small bowel loops into the pelvis. Distal small bowel is decompressed. Findings compatible with distal small bowel obstruction. Exact transition point not visualized. Stomach and large bowel unremarkable. Vascular/Lymphatic: Aortic atherosclerosis. No evidence of aneurysm or adenopathy. Reproductive: No visible focal abnormality. Other: No free fluid or free air. Musculoskeletal: No acute bony abnormality. IMPRESSION: Dilated fluid-filled small bowel loops into the pelvis. Distal small bowel decompressed. Findings compatible with distal small bowel obstruction. Aortic atherosclerosis, coronary artery disease. Dependent atelectasis in the lower lobes. Electronically Signed   By: Charlett Nose M.D.   On: 09/25/2023 19:44    Pending Labs Unresulted Labs (From admission, onward)     Start     Ordered   09/26/23 0500  APTT  Tomorrow morning,   R        09/25/23 2127   09/26/23 0500  Protime-INR  Tomorrow morning,   R        09/25/23 2127   09/26/23 0500  Basic metabolic panel  Tomorrow morning,   R        09/25/23 2127   09/26/23 0500  CBC  Tomorrow morning,   R        09/25/23 2127   09/25/23 2222  Osmolality  Once,   AD        09/25/23 2222   09/25/23 2129  Basic metabolic panel  ONCE - URGENT,   URGENT        09/25/23 2129   09/25/23 2114  Creatinine, urine,  random  Add-on,   AD        09/25/23 2113   09/25/23 2102  Sodium, urine, random  Add-on,   AD        09/25/23 2101   09/25/23 2101  Magnesium  Add-on,   AD        09/25/23 2101   09/25/23 2100  Culture, blood (Routine X 2) w Reflex to ID Panel  BLOOD CULTURE X 2,   R (with STAT occurrences)      09/25/23 2059   09/25/23 2009  Urine Culture  Add-on,   AD       Question:  Indication  Answer:  Acute gross hematuria   09/25/23 2008            Vitals/Pain Today's Vitals   09/25/23 1738 09/25/23 1754 09/25/23 1929 09/25/23 1932  BP:    137/85  Pulse:    82  Resp:    14  Temp:   98.3 F (36.8 C)   TempSrc:   Oral   SpO2:  99%  Weight:      Height:      PainSc: 1  1       Isolation Precautions No active isolations  Medications Medications  cefTRIAXone (ROCEPHIN) 2 g in sodium chloride 0.9 % 100 mL IVPB (2 g Intravenous New Bag/Given 09/25/23 2147)  metroNIDAZOLE (FLAGYL) IVPB 500 mg (has no administration in time range)  pantoprazole (PROTONIX) injection 40 mg (40 mg Intravenous Given 09/25/23 2203)  sodium bicarbonate 150 mEq in dextrose 5 % 1,150 mL infusion ( Intravenous New Bag/Given 09/25/23 2202)  diatrizoate meglumine-sodium (GASTROGRAFIN) 66-10 % solution 90 mL (has no administration in time range)  acetaminophen (TYLENOL) tablet 650 mg (has no administration in time range)    Or  acetaminophen (TYLENOL) suppository 650 mg (has no administration in time range)  polyethylene glycol (MIRALAX / GLYCOLAX) packet 17 g (has no administration in time range)  heparin injection 5,000 Units (has no administration in time range)  sodium chloride flush (NS) 0.9 % injection 3 mL (3 mLs Intravenous Given 09/25/23 2205)  HYDROmorphone (DILAUDID) injection 0.5 mg (has no administration in time range)  lidocaine (XYLOCAINE) 4 % external solution (has no administration in time range)  morphine (PF) 4 MG/ML injection 4 mg (4 mg Intravenous Given 09/25/23 1629)  ondansetron  (ZOFRAN) injection 4 mg (4 mg Intravenous Given 09/25/23 1629)  sodium chloride 0.9 % bolus 1,000 mL (0 mLs Intravenous Stopped 09/25/23 1845)  sodium chloride 0.9 % bolus 1,000 mL (0 mLs Intravenous Stopped 09/25/23 2149)    Mobility non-ambulatory

## 2023-09-25 NOTE — Assessment & Plan Note (Signed)
Moderate chronic asymptaotmic. Check serum osm, urine sodium. Likely is prerenal. At this time patient needs fluid rescucitation, will trend.

## 2023-09-25 NOTE — Assessment & Plan Note (Addendum)
See CT above. In the setting of no prior intra-abd surgery per aptient. Will c.w. NGT placed in ER. Abd benign to palpation at this time and patient pretty comfortable. Will cw. Fluids as mentioned in AKI. Surgical input has been sought be ER attending, with Dr. Derrell Lolling who will see patinet in AM. Keep NGT to low intermitent suction for now. Bowel sounds were not heard during author exam. So will wait for bowel function to return for now. Check magnesium level.

## 2023-09-25 NOTE — ED Notes (Signed)
Advanced NG tube 5cm per KUB results and Maxwell Marion PA. Patient tolerated well.

## 2023-09-25 NOTE — ED Triage Notes (Signed)
Patient presented to ER via [facility] from home, patient has been nauseous/vomiting for 24 hours. Patient is mostly bed bound at home, lives with wife. Patient denies vomiting any blood, takes blood thinner for history of Afib.

## 2023-09-26 ENCOUNTER — Inpatient Hospital Stay (HOSPITAL_COMMUNITY): Payer: Medicare Other

## 2023-09-26 DIAGNOSIS — Z96641 Presence of right artificial hip joint: Secondary | ICD-10-CM | POA: Diagnosis not present

## 2023-09-26 DIAGNOSIS — I10 Essential (primary) hypertension: Secondary | ICD-10-CM | POA: Diagnosis not present

## 2023-09-26 DIAGNOSIS — I700 Atherosclerosis of aorta: Secondary | ICD-10-CM | POA: Diagnosis not present

## 2023-09-26 DIAGNOSIS — E119 Type 2 diabetes mellitus without complications: Secondary | ICD-10-CM | POA: Diagnosis not present

## 2023-09-26 DIAGNOSIS — K5669 Other partial intestinal obstruction: Secondary | ICD-10-CM | POA: Diagnosis not present

## 2023-09-26 DIAGNOSIS — Z4682 Encounter for fitting and adjustment of non-vascular catheter: Secondary | ICD-10-CM | POA: Diagnosis not present

## 2023-09-26 DIAGNOSIS — K56609 Unspecified intestinal obstruction, unspecified as to partial versus complete obstruction: Secondary | ICD-10-CM | POA: Diagnosis not present

## 2023-09-26 DIAGNOSIS — K5900 Constipation, unspecified: Secondary | ICD-10-CM | POA: Diagnosis not present

## 2023-09-26 LAB — BASIC METABOLIC PANEL
Anion gap: 11 (ref 5–15)
BUN: 44 mg/dL — ABNORMAL HIGH (ref 8–23)
CO2: 22 mmol/L (ref 22–32)
Calcium: 8.6 mg/dL — ABNORMAL LOW (ref 8.9–10.3)
Chloride: 95 mmol/L — ABNORMAL LOW (ref 98–111)
Creatinine, Ser: 3.13 mg/dL — ABNORMAL HIGH (ref 0.61–1.24)
GFR, Estimated: 20 mL/min — ABNORMAL LOW (ref >60)
Glucose, Bld: 191 mg/dL — ABNORMAL HIGH (ref 70–99)
Potassium: 4.6 mmol/L (ref 3.5–5.1)
Sodium: 128 mmol/L — ABNORMAL LOW (ref 135–145)

## 2023-09-26 LAB — CBC
HCT: 44 % (ref 39.0–52.0)
Hemoglobin: 15.2 g/dL (ref 13.0–17.0)
MCH: 30.1 pg (ref 26.0–34.0)
MCHC: 34.5 g/dL (ref 30.0–36.0)
MCV: 87.1 fL (ref 80.0–100.0)
Platelets: 271 10*3/uL (ref 150–400)
RBC: 5.05 MIL/uL (ref 4.22–5.81)
RDW: 14.9 % (ref 11.5–15.5)
WBC: 6.9 10*3/uL (ref 4.0–10.5)
nRBC: 0 % (ref 0.0–0.2)

## 2023-09-26 LAB — PROTIME-INR
INR: 1.2 (ref 0.8–1.2)
Prothrombin Time: 15.6 s — ABNORMAL HIGH (ref 11.4–15.2)

## 2023-09-26 LAB — OSMOLALITY: Osmolality: 302 mosm/kg — ABNORMAL HIGH (ref 275–295)

## 2023-09-26 LAB — APTT: aPTT: 29 s (ref 24–36)

## 2023-09-26 MED ORDER — SODIUM CHLORIDE 0.9 % IV SOLN: INTRAVENOUS | Status: DC | PRN

## 2023-09-26 MED ORDER — SODIUM CHLORIDE 0.9% FLUSH
3.0000 mL | INTRAVENOUS | Status: DC | PRN
Start: 2023-09-26 — End: 2023-10-04

## 2023-09-26 MED ORDER — SODIUM CHLORIDE 0.9% FLUSH
3.0000 mL | Freq: Two times a day (BID) | INTRAVENOUS | Status: DC
Start: 2023-09-26 — End: 2023-09-28

## 2023-09-26 NOTE — Progress Notes (Signed)
PROGRESS NOTE    Antonio Serrano  ZOX:[id number] DOB: 1946-04-05 DOA: 09/25/2023 PCP: Jeoffrey Massed, MD     Brief Narrative:  Antonio Serrano is a 77 y.o. male with medical history significant of no reported intra-abdominal surgery.  Patient was in his usual state of health till approximately day before yesterday when he reports some mild onset of crampy abdominal pain and more than 10 episodes of vomiting. Patient has been unable to tolerate p.o. diet however because of nausea.  In the ER patient's CAT scan is concerning for small bowel obstruction, patient has had nasogastric tube placed with about 850 cc of dark brown/feculent aspiration thus far.  General surgery was consulted.  New events last 24 hours / Subjective: Patient without any abdominal pain or nausea today.  Not passed any flatus or bowel movement yet  Assessment & Plan:   Principal Problem:   SBO (small bowel obstruction) (HCC) Active Problems:   Hyponatremia   Leukocytosis   AKI (acute kidney injury) (HCC)  SBO -NG tube in place  -Continue SBO protocol -General Surgery following  AKI -Baseline creatinine 1.3 -Improving with IV fluid -continue  Holding home oral medications including Norvasc, Plavix, Myrbetriq    DVT prophylaxis:  heparin injection 5,000 Units Start: 09/26/23 0600 SCDs Start: 09/25/23 2127  Code Status: Full code Family Communication: At bedside Disposition Plan: Home Status is: Inpatient Remains inpatient appropriate because: SBO treatment    Antimicrobials:  Anti-infectives (From admission, onward)    Start     Dose/Rate Route Frequency Ordered Stop   09/25/23 2200  metroNIDAZOLE (FLAGYL) IVPB 500 mg        500 mg 100 mL/hr over 60 Minutes Intravenous Every 12 hours 09/25/23 2059     09/25/23 2115  cefTRIAXone (ROCEPHIN) 2 g in sodium chloride 0.9 % 100 mL IVPB        2 g 200 mL/hr over 30 Minutes Intravenous Every 24 hours 09/25/23 2059          Objective: Vitals:    09/25/23 2243 09/26/23 0309 09/26/23 0636 09/26/23 0919  BP: 120/71 125/82 130/75 118/75  Pulse: 88 94 84 83  Resp: 16 16 16  (!) 22  Temp: 98.2 F (36.8 C) (!) 97.5 F (36.4 C) 97.8 F (36.6 C) 97.7 F (36.5 C)  TempSrc: Oral Oral Oral   SpO2: 100% 98% 99% 100%  Weight:      Height:        Intake/Output Summary (Last 24 hours) at 09/26/2023 1137 Last data filed at 09/26/2023 1001 Gross per 24 hour  Intake 1424.6 ml  Output 2300 ml  Net -875.4 ml   Filed Weights   09/25/23 1516  Weight: 72.6 kg    Examination:  General exam: Appears calm and comfortable  Respiratory system: Clear to auscultation. Respiratory effort normal. No respiratory distress. No conversational dyspnea.  Cardiovascular system: S1 & S2 heard, RRR. No murmurs. No pedal edema. Gastrointestinal system: Abdomen is nondistended, soft and +faint bowel sounds  Central nervous system: Alert and oriented. No focal neurological deficits. Speech clear.  Extremities: Symmetric in appearance  Skin: No rashes, lesions or ulcers on exposed skin  Psychiatry: Judgement and insight appear normal. Mood & affect appropriate.   Data Reviewed: I have personally reviewed following labs and imaging studies  CBC: Recent Labs  Lab 09/25/23 1530 09/25/23 2140 09/26/23 0444  WBC 16.0* 10.3 6.9  NEUTROABS 13.9*  --   --   HGB 15.4 15.6 15.2  HCT 44.8 45.0 44.0  MCV 86.3 86.7 87.1  PLT 307 306 271   Basic Metabolic Panel: Recent Labs  Lab 09/25/23 1711 09/25/23 2140 09/26/23 0444  NA 127* 127* 128*  K 5.0 4.6 4.6  CL 95* 96* 95*  CO2 18* 19* 22  GLUCOSE 188* 132* 191*  BUN 48* 47* 44*  CREATININE 4.19* 3.69* 3.13*  CALCIUM 9.1 8.6* 8.6*  MG  --  2.0  --    GFR: Estimated Creatinine Clearance: 18.4 mL/min (A) (by C-G formula based on SCr of 3.13 mg/dL (H)). Liver Function Tests: Recent Labs  Lab 09/25/23 1711  AST 17  ALT 10  ALKPHOS 105  BILITOT 0.9  PROT 6.9  ALBUMIN 3.7   No results for  input(s): "LIPASE", "AMYLASE" in the last 168 hours. No results for input(s): "AMMONIA" in the last 168 hours. Coagulation Profile: Recent Labs  Lab 09/26/23 0444  INR 1.2   Cardiac Enzymes: No results for input(s): "CKTOTAL", "CKMB", "CKMBINDEX", "TROPONINI" in the last 168 hours. BNP (last 3 results) No results for input(s): "PROBNP" in the last 8760 hours. HbA1C: No results for input(s): "HGBA1C" in the last 72 hours. CBG: No results for input(s): "GLUCAP" in the last 168 hours. Lipid Profile: No results for input(s): "CHOL", "HDL", "LDLCALC", "TRIG", "CHOLHDL", "LDLDIRECT" in the last 72 hours. Thyroid Function Tests: No results for input(s): "TSH", "T4TOTAL", "FREET4", "T3FREE", "THYROIDAB" in the last 72 hours. Anemia Panel: No results for input(s): "VITAMINB12", "FOLATE", "FERRITIN", "TIBC", "IRON", "RETICCTPCT" in the last 72 hours. Sepsis Labs: Recent Labs  Lab 09/25/23 2202  LATICACIDVEN 2.0*    Recent Results (from the past 240 hours)  Culture, blood (Routine X 2) w Reflex to ID Panel     Status: None (Preliminary result)   Collection Time: 09/25/23  9:30 PM   Specimen: BLOOD RIGHT FOREARM  Result Value Ref Range Status   Specimen Description   Final    BLOOD RIGHT FOREARM Performed at [facility], [address], [place], [place] [address]    Special Requests   Final    BOTTLES DRAWN AEROBIC AND ANAEROBIC Blood Culture results may not be optimal due to an inadequate volume of blood received in culture bottles Performed at [facility], [address] [place], [place] [address]    Culture   Final    NO GROWTH < 12 HOURS Performed at [facility], [address], [place], [place] [address]    Report Status PENDING  Incomplete  Culture, blood (Routine X 2) w Reflex to ID Panel     Status: None (Preliminary result)   Collection Time: 09/25/23  9:40 PM   Specimen: BLOOD  Result Value Ref Range Status   Specimen  Description   Final    BLOOD RIGHT ANTECUBITAL Performed at [facility], [address] [place], [place] [address]    Special Requests   Final    BOTTLES DRAWN AEROBIC AND ANAEROBIC Blood Culture adequate volume Performed at [facility], [address], [place], [place] [address]    Culture   Final    NO GROWTH < 12 HOURS Performed at [facility], [address], [place], [place] [address]    Report Status PENDING  Incomplete      Radiology Studies: DG Abd Portable 1V-Small Bowel Obstruction Protocol-initial, 8 hr delay Result Date: 09/26/2023 CLINICAL DATA:  8 hour delayed small-bowel follow-through. EXAM: PORTABLE ABDOMEN - 1 VIEW COMPARISON:  Film yesterday at 9:33  p.m., and CT yesterday at 7:12 p.m. FINDINGS: Supine portable AP study of the abdomen was obtained at 7:18 a.m. NGT is in the stomach with the tip in the proximal lumen with the side hole at about the EG junction and should be advanced further in 8-10 cm. There is contrast in the gastric fundus and faintly opacifying dilated small bowel segments in the upper to mid abdomen. There are no visible opacified decompressed bowel segments in the lower abdomen or pelvis and there is no opacification in the ascending colon. Small bowel dilatation in the upper to mid abdomen is little if any changed measuring 4.7 cm. Findings remain consistent with a high-grade obstruction to the mid to distal small bowel. Moderate retained stool continues to be seen in the ascending colon. There is no supine evidence for free air. Right hip arthroplasty with degenerative change of the spine and osteopenia. Lung bases are clear.  There is aortic atherosclerosis. IMPRESSION: 1. NGT tip in the proximal stomach with the side hole at about the EG junction and should be advanced further in 8-10 cm. 2. Contrast in the gastric fundus and faintly opacifying dilated small bowel segments in the upper to mid abdomen. No  visible opacified decompressed bowel segments in the lower abdomen or pelvis and no opacification in the ascending colon. 3. Small bowel dilatation in the upper to mid abdomen is little if any changed measuring 4.7 cm. 4. Findings remain consistent with a high-grade mid to distal SBO. 5. Moderate retained stool in the ascending colon. 6. Aortic atherosclerosis. Electronically Signed   By: Almira Bar M.D.   On: 09/26/2023 07:49   DG CHEST PORT 1 VIEW Result Date: 09/25/2023 CLINICAL DATA:  NG tube positioning. EXAM: PORTABLE CHEST 1 VIEW COMPARISON:  June 14, 2023 FINDINGS: An enteric tube is seen with its distal tip noted within the body of the stomach. Its distal side hole is approximately 4.3 cm distal to the expected region of the gastroesophageal junction. The heart size and mediastinal contours are within normal limits. Very mild atelectasis and/or infiltrate is suspected within the lateral aspect of the right lung base. No pleural effusion or pneumothorax is identified. A chronic ninth right rib fracture is seen. IMPRESSION: 1. Enteric tube positioning, as described above. 2. Very mild right basilar atelectasis and/or infiltrate. Electronically Signed   By: Aram Candela M.D.   On: 09/25/2023 22:02   DG Abd Portable 1V-Small Bowel Protocol-Position Verification Result Date: 09/25/2023 CLINICAL DATA:  Enteric tube placement EXAM: PORTABLE ABDOMEN - 1 VIEW COMPARISON:  Earlier same day abdominal radiograph at 8:25 p.m. FINDINGS: Interval advancement of the enteric tube with tip projecting over the left upper quadrant. The side hole now projects over the gastric cardia. Partially imaged small bowel dilation. IMPRESSION: Interval advancement of the enteric tube with tip projecting over the left upper quadrant. The side hole now projects over the gastric cardia. Further advancement can be considered. Electronically Signed   By: Agustin Cree M.D.   On: 09/25/2023 21:59   DG Abd Portable 1V-Small  Bowel Protocol-Position Verification Result Date: 09/25/2023 CLINICAL DATA:  Enteric tube placement EXAM: PORTABLE ABDOMEN - 1 VIEW COMPARISON:  CT abdomen dated 09/25/2023 FINDINGS: Gastric/enteric tube tip projects over the stomach with side hole projecting over the gastroesophageal junction. Partially imaged dilated small bowel throughout the abdomen. IMPRESSION: Gastric/enteric tube tip projects over the stomach with side hole projecting over the gastroesophageal junction. Recommend advancing by at least 5 cm for more optimal positioning. Electronically  Signed   By: Agustin Cree M.D.   On: 09/25/2023 20:39   CT ABDOMEN PELVIS WO CONTRAST Result Date: 09/25/2023 CLINICAL DATA:  Nausea, vomiting.  Abdominal pain. EXAM: CT ABDOMEN AND PELVIS WITHOUT CONTRAST TECHNIQUE: Multidetector CT imaging of the abdomen and pelvis was performed following the standard protocol without IV contrast. RADIATION DOSE REDUCTION: This exam was performed according to the departmental dose-optimization program which includes automated exposure control, adjustment of the mA and/or kV according to patient size and/or use of iterative reconstruction technique. COMPARISON:  05/12/2023 FINDINGS: Lower chest: Dependent atelectasis in the lower lobes. Coronary artery and aortic atherosclerosis. Hepatobiliary: No focal hepatic abnormality. Gallbladder unremarkable. Pancreas: No focal abnormality or ductal dilatation. Spleen: No focal abnormality.  Normal size. Adrenals/Urinary Tract: Adrenal glands unremarkable. Severely atrophic right kidney. Stable cyst in the upper pole of the right kidney and cyst in the mid to lower pole of the left kidney. No follow-up imaging recommended. No stones or hydronephrosis. Urinary bladder unremarkable. Stomach/Bowel: Dilated fluid-filled small bowel loops into the pelvis. Distal small bowel is decompressed. Findings compatible with distal small bowel obstruction. Exact transition point not visualized.  Stomach and large bowel unremarkable. Vascular/Lymphatic: Aortic atherosclerosis. No evidence of aneurysm or adenopathy. Reproductive: No visible focal abnormality. Other: No free fluid or free air. Musculoskeletal: No acute bony abnormality. IMPRESSION: Dilated fluid-filled small bowel loops into the pelvis. Distal small bowel decompressed. Findings compatible with distal small bowel obstruction. Aortic atherosclerosis, coronary artery disease. Dependent atelectasis in the lower lobes. Electronically Signed   By: Charlett Nose M.D.   On: 09/25/2023 19:44      Scheduled Meds:  heparin  5,000 Units Subcutaneous Q8H   pantoprazole (PROTONIX) IV  40 mg Intravenous QHS   sodium chloride flush  3 mL Intravenous Q12H   sodium chloride flush  3-10 mL Intravenous Q12H   Continuous Infusions:  cefTRIAXone (ROCEPHIN)  IV Stopped (09/25/23 2217)   metronidazole 500 mg (09/26/23 0905)   sodium bicarbonate 150 mEq in dextrose 5 % 1,150 mL infusion 125 mL/hr at 09/26/23 0845     LOS: 1 day   Time spent: 25 minutes   Noralee Stain, DO [facility] 09/26/2023, 11:37 AM   Available via Epic secure chat 7am-7pm After these hours, please refer to coverage provider listed on [email or web address]

## 2023-09-26 NOTE — Progress Notes (Signed)
   09/26/23 1248  TOC Brief Assessment  Insurance and Status Reviewed  Patient has primary care physician Yes  Home environment has been reviewed Resides with spouse and children  Prior level of function: Bedbound at baseline  Prior/Current Home Services No current home services  Social Drivers of Health Review SDOH reviewed no interventions necessary  Readmission risk has been reviewed Yes  Transition of care needs no transition of care needs at this time

## 2023-09-26 NOTE — Consult Note (Signed)
Reason for Consult: SBO Referring Physician: Dr. [name] is an 77 y.o. male.  HPI: Patient is a 77 year old male, who comes in with history of chronic renal insufficiency, diabetes, hypertension, hyperlipidemia history of TIAs, and 1 day history of nausea vomiting.  Patient states he had no real abdominal pain.  Patient states that he had not had any previous occurrences.  Patient denies any previous abdominal surgery.  On evaluation the ER he underwent CT scan.  This was significant for dilated loops of small bowel in the pelvis with distal decompressed loops.  Patient with no leukocytosis.  I did review the patient's CT scan laboratory studies personally.  NG tube was placed in ER.  General surgery was consulted for further evaluation  Past Medical History:  Diagnosis Date   Atherosclerosis of coronary artery    On chest CT->calcif in LAD and RCA   Cerebrovascular disease    03/2023 MRI brain showed chronic microvasc isch changes and small remote infarcts   Chronic low back pain    MR 11/2021 showed L4-5 foraminal stenosis--> plan per Ortho was to do L4-5 injection   Chronic prostatitis    Very mild elevation of PSA (>4), referred to [facility] where PSA repeat was 1.0 on 06/16/13.  Annual PSA repeat is all that is needed now per urologist.  Most recent 07/2015 was 0.45.   Chronic renal insufficiency, stage III (moderate) (HCC)    Baseline GFR as of 2019= 50s.     Closed right hip fracture (HCC) 03/2021   THA   Diabetes mellitus with complication (HCC) Dx'd fall 2011   HTN (hypertension)    Renal/aortic doppler u/s normal 06/2010   Hypercalcemia    Hyperlipidemia 2016   Statin intolerant--myalgias.  Pt refuses any further trial of statin as of 2018.   Nonspecific abnormal electrocardiogram (ECG) (EKG) Fall 2011   TWI in inferior leads: myocardial perfusion scan neg and echo normal 07/2010   Pseudomonas aeruginosa colonization    12/2021 urine clx   TIA (transient ischemic  attack)    12/2021- CT angio head/neck no high grade stenosis.  MR showed old lacunar infarcts corona radiata, basal ganglia, thalamus.  Echo normal.   Tobacco dependence    UTI (lower urinary tract infection)    Feb 2012 (klebsiella--dx at [facility]); 03/2013 e coli.     Past Surgical History:  Procedure Laterality Date   BIOPSY  06/16/2023   Procedure: BIOPSY;  Surgeon: Antonio Serrano., MD;  Location: [facility];  Service: Gastroenterology;;   CARDIOVASCULAR STRESS TEST  07/01/2010   Myocardial perfusion scan neg/low risk.   CATARACT EXTRACTION  2007, 2008   Bilateral ([facility] [address].   COLONOSCOPY  12/20/04;12/2014   2016 no polyps: recall 5 yrs due to FH of colon ca   ESOPHAGOGASTRODUODENOSCOPY  11/29/2004   esoph stricture/dilation   ESOPHAGOGASTRODUODENOSCOPY N/A 06/16/2023   Procedure: ESOPHAGOGASTRODUODENOSCOPY (EGD);  Surgeon: Antonio Serrano., MD;  Location: [facility];  Service: Gastroenterology;  Laterality: N/A;   SAVORY DILATION N/A 06/16/2023   Procedure: SAVORY DILATION;  Surgeon: Antonio Serrano., MD;  Location: [facility];  Service: Gastroenterology;  Laterality: N/A;   TOTAL HIP ARTHROPLASTY Right 04/23/2021   Procedure: TOTAL HIP ARTHROPLASTY ANTERIOR APPROACH;  Surgeon: Antonio Copa, MD;  Location: [facility];  Service: Orthopedics;  Laterality: Right;  Antonio Serrano, C-arm, Depuy   TRANSTHORACIC ECHOCARDIOGRAM  07/01/2010   2011 EF =>55%; LA mildly dilated; trace MR/TR.  12/2021 EF 60-65%, DD indeterm,  valves nl.    Family History  Problem Relation Age of Onset   Dementia Mother    Pneumonia Father        died in 7's of pneumonia, was otherwise healthy   Cancer Sister        colon cancer.  Died age 35   Diabetes Paternal Aunt    Diabetes Sister    Colon cancer Neg Hx     Social History:  reports that he has been smoking cigarettes. He has a 12.5 pack-year smoking history. He has never used smokeless tobacco. He  reports that he does not currently use alcohol. He reports that he does not use drugs.  Allergies:  Allergies  Allergen Reactions   Bactrim [Sulfamethoxazole-Trimethoprim] Nausea Only    Medications: I have reviewed the patient's current medications.  Results for orders placed or performed during the hospital encounter of 09/25/23 (from the past 48 hours)  CBC with Differential     Status: Abnormal   Collection Time: 09/25/23  3:30 PM  Result Value Ref Range   WBC 16.0 (H) 4.0 - 10.5 K/uL   RBC 5.19 4.22 - 5.81 MIL/uL   Hemoglobin 15.4 13.0 - 17.0 g/dL   HCT 81.8 29.9 - 37.1 %   MCV 86.3 80.0 - 100.0 fL   MCH 29.7 26.0 - 34.0 pg   MCHC 34.4 30.0 - 36.0 g/dL   RDW 69.6 78.9 - 38.1 %   Platelets 307 150 - 400 K/uL   nRBC 0.0 0.0 - 0.2 %   Neutrophils Relative % 88 %   Neutro Abs 13.9 (H) 1.7 - 7.7 K/uL   Lymphocytes Relative 7 %   Lymphs Abs 1.2 0.7 - 4.0 K/uL   Monocytes Relative 5 %   Monocytes Absolute 0.8 0.1 - 1.0 K/uL   Eosinophils Relative 0 %   Eosinophils Absolute 0.0 0.0 - 0.5 K/uL   Basophils Relative 0 %   Basophils Absolute 0.0 0.0 - 0.1 K/uL   Immature Granulocytes 0 %   Abs Immature Granulocytes 0.07 0.00 - 0.07 K/uL    Comment: Performed at [facility], [address] [place], [place] [address]  Comprehensive metabolic panel     Status: Abnormal   Collection Time: 09/25/23  5:11 PM  Result Value Ref Range   Sodium 127 (L) 135 - 145 mmol/L   Potassium 5.0 3.5 - 5.1 mmol/L   Chloride 95 (L) 98 - 111 mmol/L   CO2 18 (L) 22 - 32 mmol/L   Glucose, Bld 188 (H) 70 - 99 mg/dL    Comment: Glucose reference range applies only to samples taken after fasting for at least 8 hours.   BUN 48 (H) 8 - 23 mg/dL   Creatinine, Ser 0.25 (H) 0.61 - 1.24 mg/dL   Calcium 9.1 8.9 - 85.2 mg/dL   Total Protein 6.9 6.5 - 8.1 g/dL   Albumin 3.7 3.5 - 5.0 g/dL   AST 17 15 - 41 U/L   ALT 10 0 - 44 U/L   Alkaline Phosphatase 105 38 - 126 U/L   Total Bilirubin  0.9 <1.2 mg/dL   GFR, Estimated 14 (L) >60 mL/min    Comment: (NOTE) Calculated using the CKD-EPI Creatinine Equation (2021)    Anion gap 14 5 - 15    Comment: Performed at [facility], [address] [place], [place] [address]  Urinalysis, Routine w reflex microscopic -Urine, Clean Catch     Status: Abnormal   Collection  Time: 09/25/23  7:36 PM  Result Value Ref Range   Color, Urine AMBER (A) YELLOW    Comment: BIOCHEMICALS MAY BE AFFECTED BY COLOR   APPearance CLOUDY (A) CLEAR   Specific Gravity, Urine 1.017 1.005 - 1.030   pH 5.0 5.0 - 8.0   Glucose, UA 50 (A) NEGATIVE mg/dL   Hgb urine dipstick SMALL (A) NEGATIVE   Bilirubin Urine NEGATIVE NEGATIVE   Ketones, ur NEGATIVE NEGATIVE mg/dL   Protein, ur 30 (A) NEGATIVE mg/dL   Nitrite NEGATIVE NEGATIVE   Leukocytes,Ua MODERATE (A) NEGATIVE   RBC / HPF 11-20 0 - 5 RBC/hpf   WBC, UA >50 0 - 5 WBC/hpf   Bacteria, UA NONE SEEN NONE SEEN   Squamous Epithelial / HPF 6-10 0 - 5 /HPF   Mucus PRESENT    Hyaline Casts, UA PRESENT    Sperm, UA PRESENT     Comment: Performed at [facility], [address], [place], [place] [address]  Sodium, urine, random     Status: None   Collection Time: 09/25/23  7:36 PM  Result Value Ref Range   Sodium, Ur 34 mmol/L    Comment: Performed at [facility], [address] [place], [place] [address]  Creatinine, urine, random     Status: None   Collection Time: 09/25/23  7:36 PM  Result Value Ref Range   Creatinine, Urine 186 mg/dL    Comment: Performed at [facility], [address], [place], [place] [address]  Culture, blood (Routine X 2) w Reflex to ID Panel     Status: None (Preliminary result)   Collection Time: 09/25/23  9:30 PM   Specimen: BLOOD RIGHT FOREARM  Result Value Ref Range   Specimen Description      BLOOD RIGHT FOREARM Performed at [facility], [address] [place], [place]  [address]    Special Requests      BOTTLES DRAWN AEROBIC AND ANAEROBIC Blood Culture results may not be optimal due to an inadequate volume of blood received in culture bottles Performed at [facility], [address], [place], [place] [address]    Culture      NO GROWTH < 12 HOURS Performed at [facility], [address], [place], [place] [address]    Report Status PENDING   Culture, blood (Routine X 2) w Reflex to ID Panel     Status: None (Preliminary result)   Collection Time: 09/25/23  9:40 PM   Specimen: BLOOD  Result Value Ref Range   Specimen Description      BLOOD RIGHT ANTECUBITAL Performed at [facility], [address] [place], [place] [address]    Special Requests      BOTTLES DRAWN AEROBIC AND ANAEROBIC Blood Culture adequate volume Performed at [facility], [address] [place], [place] [address]    Culture      NO GROWTH < 12 HOURS Performed at [facility], [address], [place], [place] [address]    Report Status PENDING   Magnesium     Status: None   Collection Time: 09/25/23  9:40 PM  Result Value Ref Range   Magnesium 2.0 1.7 - 2.4 mg/dL    Comment: Performed at [facility], [address], [place], [place] [address]  Troponin I (High Sensitivity)     Status: None   Collection Time: 09/25/23  9:40 PM  Result  Value Ref Range   Troponin I (High Sensitivity) 12 <18 ng/L    Comment: (NOTE) Elevated high sensitivity troponin I (hsTnI) values and significant  changes across serial measurements may suggest ACS but many other  chronic and acute conditions are known to elevate hsTnI results.  Refer to the "Links" section for chest pain algorithms and additional  guidance. Performed at [facility], [address] [place], [place] [address]   CBC     Status: None   Collection Time: 09/25/23  9:40 PM  Result Value Ref Range   WBC 10.3 4.0 - 10.5  K/uL   RBC 5.19 4.22 - 5.81 MIL/uL   Hemoglobin 15.6 13.0 - 17.0 g/dL   HCT 78.2 95.6 - 21.3 %   MCV 86.7 80.0 - 100.0 fL   MCH 30.1 26.0 - 34.0 pg   MCHC 34.7 30.0 - 36.0 g/dL   RDW 08.6 57.8 - 46.9 %   Platelets 306 150 - 400 K/uL   nRBC 0.0 0.0 - 0.2 %    Comment: Performed at [facility], [address], [place], [place] [address]  Basic metabolic panel     Status: Abnormal   Collection Time: 09/25/23  9:40 PM  Result Value Ref Range   Sodium 127 (L) 135 - 145 mmol/L   Potassium 4.6 3.5 - 5.1 mmol/L   Chloride 96 (L) 98 - 111 mmol/L   CO2 19 (L) 22 - 32 mmol/L   Glucose, Bld 132 (H) 70 - 99 mg/dL    Comment: Glucose reference range applies only to samples taken after fasting for at least 8 hours.   BUN 47 (H) 8 - 23 mg/dL   Creatinine, Ser 8.41 (H) 0.61 - 1.24 mg/dL   Calcium 8.6 (L) 8.9 - 10.3 mg/dL   GFR, Estimated 16 (L) >60 mL/min    Comment: (NOTE) Calculated using the CKD-EPI Creatinine Equation (2021)    Anion gap 12 5 - 15    Comment: Performed at [facility], [address] [place], [place] [address]  I-Stat CG4 Lactic Acid     Status: Abnormal   Collection Time: 09/25/23 10:02 PM  Result Value Ref Range   Lactic Acid, Venous 2.0 (HH) 0.5 - 1.9 mmol/L  APTT     Status: None   Collection Time: 09/26/23  4:44 AM  Result Value Ref Range   aPTT 29 24 - 36 seconds    Comment: Performed at [facility], [address] [place], [place] [address]  Protime-INR     Status: Abnormal   Collection Time: 09/26/23  4:44 AM  Result Value Ref Range   Prothrombin Time 15.6 (H) 11.4 - 15.2 seconds   INR 1.2 0.8 - 1.2    Comment: (NOTE) INR goal varies based on device and disease states. Performed at [facility], [address] [place], [place] [address]   Basic metabolic panel     Status: Abnormal   Collection Time: 09/26/23  4:44 AM  Result Value Ref Range   Sodium 128 (L) 135 - 145 mmol/L    Potassium 4.6 3.5 - 5.1 mmol/L   Chloride 95 (L) 98 - 111 mmol/L   CO2 22 22 - 32 mmol/L   Glucose, Bld 191 (H) 70 - 99 mg/dL    Comment: Glucose reference range applies only to samples taken after fasting for at least 8 hours.   BUN 44 (H) 8 - 23 mg/dL  Creatinine, Ser 3.13 (H) 0.61 - 1.24 mg/dL   Calcium 8.6 (L) 8.9 - 10.3 mg/dL   GFR, Estimated 20 (L) >60 mL/min    Comment: (NOTE) Calculated using the CKD-EPI Creatinine Equation (2021)    Anion gap 11 5 - 15    Comment: Performed at [facility], [address], [place], [place] [address]  CBC     Status: None   Collection Time: 09/26/23  4:44 AM  Result Value Ref Range   WBC 6.9 4.0 - 10.5 K/uL   RBC 5.05 4.22 - 5.81 MIL/uL   Hemoglobin 15.2 13.0 - 17.0 g/dL   HCT 95.2 84.1 - 32.4 %   MCV 87.1 80.0 - 100.0 fL   MCH 30.1 26.0 - 34.0 pg   MCHC 34.5 30.0 - 36.0 g/dL   RDW 40.1 02.7 - 25.3 %   Platelets 271 150 - 400 K/uL   nRBC 0.0 0.0 - 0.2 %    Comment: Performed at [facility], [address], [place], [place] [address]    DG Abd Portable 1V-Small Bowel Obstruction Protocol-initial, 8 hr delay Result Date: 09/26/2023 CLINICAL DATA:  8 hour delayed small-bowel follow-through. EXAM: PORTABLE ABDOMEN - 1 VIEW COMPARISON:  Film yesterday at 9:33 p.m., and CT yesterday at 7:12 p.m. FINDINGS: Supine portable AP study of the abdomen was obtained at 7:18 a.m. NGT is in the stomach with the tip in the proximal lumen with the side hole at about the EG junction and should be advanced further in 8-10 cm. There is contrast in the gastric fundus and faintly opacifying dilated small bowel segments in the upper to mid abdomen. There are no visible opacified decompressed bowel segments in the lower abdomen or pelvis and there is no opacification in the ascending colon. Small bowel dilatation in the upper to mid abdomen is little if any changed measuring 4.7 cm. Findings remain consistent with a high-grade  obstruction to the mid to distal small bowel. Moderate retained stool continues to be seen in the ascending colon. There is no supine evidence for free air. Right hip arthroplasty with degenerative change of the spine and osteopenia. Lung bases are clear.  There is aortic atherosclerosis. IMPRESSION: 1. NGT tip in the proximal stomach with the side hole at about the EG junction and should be advanced further in 8-10 cm. 2. Contrast in the gastric fundus and faintly opacifying dilated small bowel segments in the upper to mid abdomen. No visible opacified decompressed bowel segments in the lower abdomen or pelvis and no opacification in the ascending colon. 3. Small bowel dilatation in the upper to mid abdomen is little if any changed measuring 4.7 cm. 4. Findings remain consistent with a high-grade mid to distal SBO. 5. Moderate retained stool in the ascending colon. 6. Aortic atherosclerosis. Electronically Signed   By: Almira Bar M.D.   On: 09/26/2023 07:49   DG CHEST PORT 1 VIEW Result Date: 09/25/2023 CLINICAL DATA:  NG tube positioning. EXAM: PORTABLE CHEST 1 VIEW COMPARISON:  June 14, 2023 FINDINGS: An enteric tube is seen with its distal tip noted within the body of the stomach. Its distal side hole is approximately 4.3 cm distal to the expected region of the gastroesophageal junction. The heart size and mediastinal contours are within normal limits. Very mild atelectasis and/or infiltrate is suspected within the lateral aspect of the right lung base. No pleural effusion or pneumothorax is identified. A chronic ninth right rib fracture is seen. IMPRESSION: 1.  Enteric tube positioning, as described above. 2. Very mild right basilar atelectasis and/or infiltrate. Electronically Signed   By: Aram Candela M.D.   On: 09/25/2023 22:02   DG Abd Portable 1V-Small Bowel Protocol-Position Verification Result Date: 09/25/2023 CLINICAL DATA:  Enteric tube placement EXAM: PORTABLE ABDOMEN - 1 VIEW  COMPARISON:  Earlier same day abdominal radiograph at 8:25 p.m. FINDINGS: Interval advancement of the enteric tube with tip projecting over the left upper quadrant. The side hole now projects over the gastric cardia. Partially imaged small bowel dilation. IMPRESSION: Interval advancement of the enteric tube with tip projecting over the left upper quadrant. The side hole now projects over the gastric cardia. Further advancement can be considered. Electronically Signed   By: Agustin Cree M.D.   On: 09/25/2023 21:59   DG Abd Portable 1V-Small Bowel Protocol-Position Verification Result Date: 09/25/2023 CLINICAL DATA:  Enteric tube placement EXAM: PORTABLE ABDOMEN - 1 VIEW COMPARISON:  CT abdomen dated 09/25/2023 FINDINGS: Gastric/enteric tube tip projects over the stomach with side hole projecting over the gastroesophageal junction. Partially imaged dilated small bowel throughout the abdomen. IMPRESSION: Gastric/enteric tube tip projects over the stomach with side hole projecting over the gastroesophageal junction. Recommend advancing by at least 5 cm for more optimal positioning. Electronically Signed   By: Agustin Cree M.D.   On: 09/25/2023 20:39   CT ABDOMEN PELVIS WO CONTRAST Result Date: 09/25/2023 CLINICAL DATA:  Nausea, vomiting.  Abdominal pain. EXAM: CT ABDOMEN AND PELVIS WITHOUT CONTRAST TECHNIQUE: Multidetector CT imaging of the abdomen and pelvis was performed following the standard protocol without IV contrast. RADIATION DOSE REDUCTION: This exam was performed according to the departmental dose-optimization program which includes automated exposure control, adjustment of the mA and/or kV according to patient size and/or use of iterative reconstruction technique. COMPARISON:  05/12/2023 FINDINGS: Lower chest: Dependent atelectasis in the lower lobes. Coronary artery and aortic atherosclerosis. Hepatobiliary: No focal hepatic abnormality. Gallbladder unremarkable. Pancreas: No focal abnormality or ductal  dilatation. Spleen: No focal abnormality.  Normal size. Adrenals/Urinary Tract: Adrenal glands unremarkable. Severely atrophic right kidney. Stable cyst in the upper pole of the right kidney and cyst in the mid to lower pole of the left kidney. No follow-up imaging recommended. No stones or hydronephrosis. Urinary bladder unremarkable. Stomach/Bowel: Dilated fluid-filled small bowel loops into the pelvis. Distal small bowel is decompressed. Findings compatible with distal small bowel obstruction. Exact transition point not visualized. Stomach and large bowel unremarkable. Vascular/Lymphatic: Aortic atherosclerosis. No evidence of aneurysm or adenopathy. Reproductive: No visible focal abnormality. Other: No free fluid or free air. Musculoskeletal: No acute bony abnormality. IMPRESSION: Dilated fluid-filled small bowel loops into the pelvis. Distal small bowel decompressed. Findings compatible with distal small bowel obstruction. Aortic atherosclerosis, coronary artery disease. Dependent atelectasis in the lower lobes. Electronically Signed   By: Charlett Nose M.D.   On: 09/25/2023 19:44    Review of Systems  Constitutional:  Negative for chills and fever.  HENT:  Negative for ear discharge, hearing loss and sore throat.   Eyes:  Negative for discharge.  Respiratory:  Negative for cough and shortness of breath.   Cardiovascular:  Negative for chest pain and leg swelling.  Gastrointestinal:  Positive for nausea and vomiting. Negative for abdominal pain, constipation and diarrhea.  Musculoskeletal:  Negative for myalgias and neck pain.  Skin:  Negative for rash.  Allergic/Immunologic: Negative for environmental allergies.  Neurological:  Negative for dizziness and seizures.  Hematological:  Does not bruise/bleed easily.  Psychiatric/Behavioral:  Negative for  suicidal ideas.   All other systems reviewed and are negative.  Blood pressure 130/75, pulse 84, temperature 97.8 F (36.6 C), temperature source  Oral, resp. rate 16, height 5\' 6"  (1.676 m), weight 72.6 kg, SpO2 99%. Physical Exam Constitutional:      Appearance: He is well-developed.     Comments: Conversant No acute distress  HENT:     Head: Normocephalic and atraumatic.  Eyes:     General: Lids are normal. No scleral icterus.    Pupils: Pupils are equal, round, and reactive to light.     Comments: Pupils are equal round and reactive No lid lag Moist conjunctiva  Neck:     Thyroid: No thyromegaly.     Trachea: No tracheal tenderness.     Comments: No cervical lymphadenopathy Cardiovascular:     Rate and Rhythm: Normal rate and regular rhythm.     Heart sounds: No murmur heard. Pulmonary:     Effort: Pulmonary effort is normal.     Breath sounds: Normal breath sounds. No wheezing or rales.  Abdominal:     General: There is no distension.     Tenderness: There is no abdominal tenderness.     Hernia: No hernia is present.  Musculoskeletal:     Cervical back: Normal range of motion and neck supple.  Skin:    General: Skin is warm.     Findings: No rash.     Nails: There is no clubbing.     Comments: Normal skin turgor  Neurological:     Mental Status: He is alert and oriented to person, place, and time.     Comments: Normal gait and station  Psychiatric:        Mood and Affect: Mood normal.        Thought Content: Thought content normal.        Judgment: Judgment normal.     Comments: Appropriate affect     Assessment/Plan: 77 year old male with SBO Hypertension Diabetes History of TIAs  1.  Continue with SBO protocol.  Patient had no bowel function at this time.  X-rays show no contrast in the colon as of yet. 2.  I discussed with the patient we will get him 24 to 48 hours to see if bowel function returns.  If things improve we can DC NG tube and start clears.  If not patient may require surgery. 3.  Will follow along.   Axel Filler 09/26/2023, 8:06 AM

## 2023-09-27 ENCOUNTER — Other Ambulatory Visit: Payer: Self-pay | Admitting: *Deleted

## 2023-09-27 ENCOUNTER — Inpatient Hospital Stay (HOSPITAL_COMMUNITY): Payer: Medicare Other

## 2023-09-27 DIAGNOSIS — I700 Atherosclerosis of aorta: Secondary | ICD-10-CM | POA: Diagnosis not present

## 2023-09-27 DIAGNOSIS — K56609 Unspecified intestinal obstruction, unspecified as to partial versus complete obstruction: Secondary | ICD-10-CM | POA: Diagnosis not present

## 2023-09-27 DIAGNOSIS — Z4682 Encounter for fitting and adjustment of non-vascular catheter: Secondary | ICD-10-CM | POA: Diagnosis not present

## 2023-09-27 DIAGNOSIS — Z96641 Presence of right artificial hip joint: Secondary | ICD-10-CM | POA: Diagnosis not present

## 2023-09-27 DIAGNOSIS — K5669 Other partial intestinal obstruction: Secondary | ICD-10-CM | POA: Diagnosis not present

## 2023-09-27 LAB — BASIC METABOLIC PANEL
Anion gap: 12 (ref 5–15)
BUN: 25 mg/dL — ABNORMAL HIGH (ref 8–23)
CO2: 34 mmol/L — ABNORMAL HIGH (ref 22–32)
Calcium: 8.2 mg/dL — ABNORMAL LOW (ref 8.9–10.3)
Chloride: 86 mmol/L — ABNORMAL LOW (ref 98–111)
Creatinine, Ser: 1.39 mg/dL — ABNORMAL HIGH (ref 0.61–1.24)
GFR, Estimated: 53 mL/min — ABNORMAL LOW (ref >60)
Glucose, Bld: 158 mg/dL — ABNORMAL HIGH (ref 70–99)
Potassium: 2.9 mmol/L — ABNORMAL LOW (ref 3.5–5.1)
Sodium: 132 mmol/L — ABNORMAL LOW (ref 135–145)

## 2023-09-27 LAB — URINALYSIS, ROUTINE W REFLEX MICROSCOPIC
Bacteria, UA: NONE SEEN
Bilirubin Urine: NEGATIVE
Glucose, UA: 50 mg/dL — AB
Hgb urine dipstick: NEGATIVE
Ketones, ur: NEGATIVE mg/dL
Leukocytes,Ua: NEGATIVE
Nitrite: NEGATIVE
Protein, ur: 100 mg/dL — AB
Specific Gravity, Urine: 1.015 (ref 1.005–1.030)
pH: 9 — ABNORMAL HIGH (ref 5.0–8.0)

## 2023-09-27 MED ORDER — PROCHLORPERAZINE EDISYLATE 10 MG/2ML IJ SOLN: 5.0000 mg | INTRAMUSCULAR | Status: DC | PRN

## 2023-09-27 MED ORDER — SALINE SPRAY 0.65 % NA SOLN: 1.0000 | Freq: Four times a day (QID) | NASAL | Status: DC | PRN

## 2023-09-27 MED ORDER — BISACODYL 10 MG RE SUPP
10.0000 mg | Freq: Every day | RECTAL | Status: DC
  Administered 2023-09-27 – 2023-10-02 (×4): 10 mg via RECTAL
  Filled 2023-09-27 (×6): qty 1

## 2023-09-27 MED ORDER — MAGIC MOUTHWASH: 15.0000 mL | Freq: Four times a day (QID) | ORAL | Status: DC | PRN

## 2023-09-27 MED ORDER — POTASSIUM CHLORIDE 10 MEQ/100ML IV SOLN
10.0000 meq | INTRAVENOUS | Status: AC
  Administered 2023-09-27 (×6): 10 meq via INTRAVENOUS
  Filled 2023-09-27 (×6): qty 100

## 2023-09-27 MED ORDER — SODIUM BICARBONATE 8.4 % IV SOLN
INTRAVENOUS | Status: DC
  Filled 2023-09-27: qty 150
  Filled 2023-09-27: qty 1000

## 2023-09-27 MED ORDER — PHENOL 1.4 % MT LIQD: 2.0000 | OROMUCOSAL | Status: DC | PRN

## 2023-09-27 MED ORDER — SODIUM CHLORIDE 0.9 % IV SOLN: INTRAVENOUS | Status: DC

## 2023-09-27 MED ORDER — ALUM & MAG HYDROXIDE-SIMETH 200-200-20 MG/5ML PO SUSP
30.0000 mL | Freq: Four times a day (QID) | ORAL | Status: DC | PRN
  Administered 2023-10-02: 30 mL via ORAL
  Filled 2023-09-27: qty 30

## 2023-09-27 MED ORDER — MENTHOL 3 MG MT LOZG: 1.0000 | LOZENGE | OROMUCOSAL | Status: DC | PRN

## 2023-09-27 MED ORDER — SIMETHICONE 40 MG/0.6ML PO SUSP: 80.0000 mg | Freq: Four times a day (QID) | ORAL | Status: DC | PRN

## 2023-09-27 MED ORDER — LACTATED RINGERS IV BOLUS: 1000.0000 mL | Freq: Three times a day (TID) | INTRAVENOUS | Status: DC | PRN

## 2023-09-27 MED ORDER — NAPHAZOLINE-GLYCERIN 0.012-0.25 % OP SOLN: 1.0000 [drp] | Freq: Four times a day (QID) | OPHTHALMIC | Status: DC | PRN

## 2023-09-27 NOTE — Progress Notes (Signed)
PROGRESS NOTE    Antonio Serrano  IEP:[id number] DOB: Jan 28, 1946 DOA: 09/25/2023 PCP: Jeoffrey Massed, MD     Brief Narrative:  Antonio Serrano is a 77 y.o. male with medical history significant of no reported intra-abdominal surgery.  Patient was in his usual state of health till approximately day before yesterday when he reports some mild onset of crampy abdominal pain and more than 10 episodes of vomiting. Patient has been unable to tolerate p.o. diet however because of nausea.  In the ER patient's CAT scan is concerning for small bowel obstruction, patient has had nasogastric tube placed with about 850 cc of dark brown/feculent aspiration thus far.  General surgery was consulted.  New events last 24 hours / Subjective: Denies any abdominal pain or nausea.  Passing gas but no bowel movement yet  Assessment & Plan:   Principal Problem:   SBO (small bowel obstruction) (HCC) Active Problems:   Hyponatremia   Leukocytosis   AKI (acute kidney injury) (HCC)  SBO -NG tube in place  -Continue SBO protocol -General Surgery following -Continue IV fluid while n.p.o.  AKI -Baseline creatinine 1.3 -Resolving with IV fluid  Hypokalemia -Replace  Holding home oral medications including Norvasc, Plavix, Myrbetriq    DVT prophylaxis:  heparin injection 5,000 Units Start: 09/26/23 0600 SCDs Start: 09/25/23 2127  Code Status: Full code Family Communication: None at bedside Disposition Plan: Home Status is: Inpatient Remains inpatient appropriate because: SBO treatment    Antimicrobials:  Anti-infectives (From admission, onward)    Start     Dose/Rate Route Frequency Ordered Stop   09/25/23 2200  metroNIDAZOLE (FLAGYL) IVPB 500 mg  Status:  Discontinued        500 mg 100 mL/hr over 60 Minutes Intravenous Every 12 hours 09/25/23 2059 09/26/23 1142   09/25/23 2115  cefTRIAXone (ROCEPHIN) 2 g in sodium chloride 0.9 % 100 mL IVPB  Status:  Discontinued        2 g 200 mL/hr over  30 Minutes Intravenous Every 24 hours 09/25/23 2059 09/26/23 1142        Objective: Vitals:   09/26/23 1326 09/26/23 1706 09/26/23 2102 09/27/23 0544  BP: 132/88 122/76 130/70 116/65  Pulse: 70 83 82 86  Resp: 18 17 18 18   Temp: (!) 97.5 F (36.4 C) 97.7 F (36.5 C) 97.7 F (36.5 C) 97.9 F (36.6 C)  TempSrc:  Oral Oral Oral  SpO2: 93% 99% 97% 93%  Weight:      Height:        Intake/Output Summary (Last 24 hours) at 09/27/2023 1240 Last data filed at 09/27/2023 1224 Gross per 24 hour  Intake 556.24 ml  Output 2776 ml  Net -2219.76 ml   Filed Weights   09/25/23 1516  Weight: 72.6 kg    Examination:  General exam: Appears calm and comfortable  Respiratory system: Clear to auscultation. Respiratory effort normal. No respiratory distress. No conversational dyspnea.  Cardiovascular system: S1 & S2 heard, RRR. No murmurs. No pedal edema. Gastrointestinal system: Abdomen is nondistended, soft and not TTP, no bowel sounds heard  Central nervous system: Alert and oriented. No focal neurological deficits. Speech clear.  Extremities: Symmetric in appearance  Skin: No rashes, lesions or ulcers on exposed skin  Psychiatry: Judgement and insight appear normal. Mood & affect appropriate.   Data Reviewed: I have personally reviewed following labs and imaging studies  CBC: Recent Labs  Lab 09/25/23 1530 09/25/23 2140 09/26/23 0444  WBC 16.0* 10.3 6.9  NEUTROABS 13.9*  --   --  HGB 15.4 15.6 15.2  HCT 44.8 45.0 44.0  MCV 86.3 86.7 87.1  PLT 307 306 271   Basic Metabolic Panel: Recent Labs  Lab 09/25/23 1711 09/25/23 2140 09/26/23 0444 09/27/23 0425  NA 127* 127* 128* 132*  K 5.0 4.6 4.6 2.9*  CL 95* 96* 95* 86*  CO2 18* 19* 22 34*  GLUCOSE 188* 132* 191* 158*  BUN 48* 47* 44* 25*  CREATININE 4.19* 3.69* 3.13* 1.39*  CALCIUM 9.1 8.6* 8.6* 8.2*  MG  --  2.0  --   --    GFR: Estimated Creatinine Clearance: 41.4 mL/min (A) (by C-G formula based on SCr of 1.39  mg/dL (H)). Liver Function Tests: Recent Labs  Lab 09/25/23 1711  AST 17  ALT 10  ALKPHOS 105  BILITOT 0.9  PROT 6.9  ALBUMIN 3.7   No results for input(s): "LIPASE", "AMYLASE" in the last 168 hours. No results for input(s): "AMMONIA" in the last 168 hours. Coagulation Profile: Recent Labs  Lab 09/26/23 0444  INR 1.2   Cardiac Enzymes: No results for input(s): "CKTOTAL", "CKMB", "CKMBINDEX", "TROPONINI" in the last 168 hours. BNP (last 3 results) No results for input(s): "PROBNP" in the last 8760 hours. HbA1C: No results for input(s): "HGBA1C" in the last 72 hours. CBG: No results for input(s): "GLUCAP" in the last 168 hours. Lipid Profile: No results for input(s): "CHOL", "HDL", "LDLCALC", "TRIG", "CHOLHDL", "LDLDIRECT" in the last 72 hours. Thyroid Function Tests: No results for input(s): "TSH", "T4TOTAL", "FREET4", "T3FREE", "THYROIDAB" in the last 72 hours. Anemia Panel: No results for input(s): "VITAMINB12", "FOLATE", "FERRITIN", "TIBC", "IRON", "RETICCTPCT" in the last 72 hours. Sepsis Labs: Recent Labs  Lab 09/25/23 2202  LATICACIDVEN 2.0*    Recent Results (from the past 240 hours)  Urine Culture     Status: Abnormal (Preliminary result)   Collection Time: 09/25/23  7:36 PM   Specimen: Urine, Clean Catch  Result Value Ref Range Status   Specimen Description   Final    URINE, CLEAN CATCH Performed at [facility], [address], [place], [place] [address]    Special Requests   Final    NONE Performed at [facility], [address], [place], [place] [address]    Culture (A)  Final    >=100,000 COLONIES/mL STAPHYLOCOCCUS AUREUS SUSCEPTIBILITIES TO FOLLOW Performed at [facility], [address], [place], [place] [address]    Report Status PENDING  Incomplete  Culture, blood (Routine X 2) w Reflex to ID Panel     Status: None (Preliminary result)   Collection Time: 09/25/23  9:30 PM   Specimen: BLOOD  RIGHT FOREARM  Result Value Ref Range Status   Specimen Description   Final    BLOOD RIGHT FOREARM Performed at [facility], [address] [place], [place] [address]    Special Requests   Final    BOTTLES DRAWN AEROBIC AND ANAEROBIC Blood Culture results may not be optimal due to an inadequate volume of blood received in culture bottles Performed at [facility], [address] [place], [place] [address]    Culture   Final    NO GROWTH 2 DAYS Performed at [facility], [address], [place], [place] [address]    Report Status PENDING  Incomplete  Culture, blood (Routine X 2) w Reflex to ID Panel     Status: None (Preliminary result)   Collection Time: 09/25/23  9:40 PM   Specimen:  BLOOD  Result Value Ref Range Status   Specimen Description   Final    BLOOD RIGHT ANTECUBITAL Performed at [facility], [address] [place], [place] [address]    Special Requests   Final    BOTTLES DRAWN AEROBIC AND ANAEROBIC Blood Culture adequate volume Performed at [facility], [address], [place], [place] [address]    Culture   Final    NO GROWTH 2 DAYS Performed at [facility], [address] [place], [place] [address]    Report Status PENDING  Incomplete      Radiology Studies: DG Abd Portable 1V Result Date: 09/27/2023 CLINICAL DATA:  Small bowel obstruction. EXAM: PORTABLE ABDOMEN - 1 VIEW COMPARISON:  09/26/2023 FINDINGS: Nasogastric tube tip is in the gastric fundus region. Persistent dilated loops of small bowel in the mid abdomen that have minimally changed. Again noted is a large amount of stool in the right colon. Patient has a right hip arthroplasty. Aortic atherosclerotic calcifications. IMPRESSION: 1. Persistent dilated loops of small bowel in the mid abdomen, minimal change from the exam on 09/26/2023. Findings are compatible with small bowel obstruction. 2. Large amount of stool  in the right colon. Electronically Signed   By: Richarda Overlie M.D.   On: 09/27/2023 09:09   DG Abd Portable 1V-Small Bowel Obstruction Protocol-initial, 8 hr delay Result Date: 09/26/2023 CLINICAL DATA:  8 hour delayed small-bowel follow-through. EXAM: PORTABLE ABDOMEN - 1 VIEW COMPARISON:  Film yesterday at 9:33 p.m., and CT yesterday at 7:12 p.m. FINDINGS: Supine portable AP study of the abdomen was obtained at 7:18 a.m. NGT is in the stomach with the tip in the proximal lumen with the side hole at about the EG junction and should be advanced further in 8-10 cm. There is contrast in the gastric fundus and faintly opacifying dilated small bowel segments in the upper to mid abdomen. There are no visible opacified decompressed bowel segments in the lower abdomen or pelvis and there is no opacification in the ascending colon. Small bowel dilatation in the upper to mid abdomen is little if any changed measuring 4.7 cm. Findings remain consistent with a high-grade obstruction to the mid to distal small bowel. Moderate retained stool continues to be seen in the ascending colon. There is no supine evidence for free air. Right hip arthroplasty with degenerative change of the spine and osteopenia. Lung bases are clear.  There is aortic atherosclerosis. IMPRESSION: 1. NGT tip in the proximal stomach with the side hole at about the EG junction and should be advanced further in 8-10 cm. 2. Contrast in the gastric fundus and faintly opacifying dilated small bowel segments in the upper to mid abdomen. No visible opacified decompressed bowel segments in the lower abdomen or pelvis and no opacification in the ascending colon. 3. Small bowel dilatation in the upper to mid abdomen is little if any changed measuring 4.7 cm. 4. Findings remain consistent with a high-grade mid to distal SBO. 5. Moderate retained stool in the ascending colon. 6. Aortic atherosclerosis. Electronically Signed   By: Almira Bar M.D.   On: 09/26/2023  07:49   DG CHEST PORT 1 VIEW Result Date: 09/25/2023 CLINICAL DATA:  NG tube positioning. EXAM: PORTABLE CHEST 1 VIEW COMPARISON:  June 14, 2023 FINDINGS: An enteric tube is seen with its distal tip noted within the body of the stomach. Its distal side hole is approximately 4.3 cm distal to the expected region of the gastroesophageal junction.  The heart size and mediastinal contours are within normal limits. Very mild atelectasis and/or infiltrate is suspected within the lateral aspect of the right lung base. No pleural effusion or pneumothorax is identified. A chronic ninth right rib fracture is seen. IMPRESSION: 1. Enteric tube positioning, as described above. 2. Very mild right basilar atelectasis and/or infiltrate. Electronically Signed   By: Aram Candela M.D.   On: 09/25/2023 22:02   DG Abd Portable 1V-Small Bowel Protocol-Position Verification Result Date: 09/25/2023 CLINICAL DATA:  Enteric tube placement EXAM: PORTABLE ABDOMEN - 1 VIEW COMPARISON:  Earlier same day abdominal radiograph at 8:25 p.m. FINDINGS: Interval advancement of the enteric tube with tip projecting over the left upper quadrant. The side hole now projects over the gastric cardia. Partially imaged small bowel dilation. IMPRESSION: Interval advancement of the enteric tube with tip projecting over the left upper quadrant. The side hole now projects over the gastric cardia. Further advancement can be considered. Electronically Signed   By: Agustin Cree M.D.   On: 09/25/2023 21:59   DG Abd Portable 1V-Small Bowel Protocol-Position Verification Result Date: 09/25/2023 CLINICAL DATA:  Enteric tube placement EXAM: PORTABLE ABDOMEN - 1 VIEW COMPARISON:  CT abdomen dated 09/25/2023 FINDINGS: Gastric/enteric tube tip projects over the stomach with side hole projecting over the gastroesophageal junction. Partially imaged dilated small bowel throughout the abdomen. IMPRESSION: Gastric/enteric tube tip projects over the stomach with  side hole projecting over the gastroesophageal junction. Recommend advancing by at least 5 cm for more optimal positioning. Electronically Signed   By: Agustin Cree M.D.   On: 09/25/2023 20:39   CT ABDOMEN PELVIS WO CONTRAST Result Date: 09/25/2023 CLINICAL DATA:  Nausea, vomiting.  Abdominal pain. EXAM: CT ABDOMEN AND PELVIS WITHOUT CONTRAST TECHNIQUE: Multidetector CT imaging of the abdomen and pelvis was performed following the standard protocol without IV contrast. RADIATION DOSE REDUCTION: This exam was performed according to the departmental dose-optimization program which includes automated exposure control, adjustment of the mA and/or kV according to patient size and/or use of iterative reconstruction technique. COMPARISON:  05/12/2023 FINDINGS: Lower chest: Dependent atelectasis in the lower lobes. Coronary artery and aortic atherosclerosis. Hepatobiliary: No focal hepatic abnormality. Gallbladder unremarkable. Pancreas: No focal abnormality or ductal dilatation. Spleen: No focal abnormality.  Normal size. Adrenals/Urinary Tract: Adrenal glands unremarkable. Severely atrophic right kidney. Stable cyst in the upper pole of the right kidney and cyst in the mid to lower pole of the left kidney. No follow-up imaging recommended. No stones or hydronephrosis. Urinary bladder unremarkable. Stomach/Bowel: Dilated fluid-filled small bowel loops into the pelvis. Distal small bowel is decompressed. Findings compatible with distal small bowel obstruction. Exact transition point not visualized. Stomach and large bowel unremarkable. Vascular/Lymphatic: Aortic atherosclerosis. No evidence of aneurysm or adenopathy. Reproductive: No visible focal abnormality. Other: No free fluid or free air. Musculoskeletal: No acute bony abnormality. IMPRESSION: Dilated fluid-filled small bowel loops into the pelvis. Distal small bowel decompressed. Findings compatible with distal small bowel obstruction. Aortic atherosclerosis,  coronary artery disease. Dependent atelectasis in the lower lobes. Electronically Signed   By: Charlett Nose M.D.   On: 09/25/2023 19:44      Scheduled Meds:  bisacodyl  10 mg Rectal Daily   heparin  5,000 Units Subcutaneous Q8H   pantoprazole (PROTONIX) IV  40 mg Intravenous QHS   sodium chloride flush  3 mL Intravenous Q12H   sodium chloride flush  3-10 mL Intravenous Q12H   Continuous Infusions:  sodium chloride 50 mL/hr at 09/27/23 0905   lactated  ringers     potassium chloride 10 mEq (09/27/23 1143)     LOS: 2 days   Time spent: 25 minutes   Noralee Stain, DO [facility] 09/27/2023, 12:40 PM   Available via Epic secure chat 7am-7pm After these hours, please refer to coverage provider listed on [email or web address]

## 2023-09-27 NOTE — Progress Notes (Signed)
09/27/2023  Antonio Serrano [id number] 03-Dec-1945  CARE TEAM: PCP: Jeoffrey Massed, MD  Outpatient Care Team: Patient Care Team: Jeoffrey Massed, MD as PCP - General (Family Medicine) Leilani Able (Dentistry) Fredrich Birks, OD as Consulting Physician (Optometry) Annie Sable, MD as Consulting Physician (Nephrology) [facility], Rachelle Hora, MD as Consulting Physician (Cardiology) Ricky Stabs, RN as VBCI Care Management (General Practice)  Inpatient Treatment Team: Treatment Team:  Noralee Stain, DO Lilyan Gilford, MD Ccs, Md, MD Vella Raring, [place] Hessie Knows, [facility] Merlyn Albert, RN Helyn Numbers, RN   Problem List:   Principal Problem:   SBO (small bowel obstruction) [facility]) Active Problems:   Hyponatremia   Leukocytosis   AKI (acute kidney injury) (HCC)   * No surgery found *      Assessment [facility] Stay = 2 days)      Small bowel obstruction Transition point down the pelvis.  No prior surgeries but there is no mass or tumor or suspicious.  Most likely some omental and other adhesions causing a transition point.    Plan:  Patient feeling better overall.  His abdomen is soft and nontender.  He is not toxic at this time.  With his history of TIAs, I do not know how reliable narrated is but seems to have some objective proof things are improving.  Continue small bowel protocol with nasogastric tube decompression.  Abdominal exams.  Follow-up films tomorrow.  If he does not improve in the next day or so, may require operative exploration since he has had no prior surgeries.  Perhaps can start out with diagnostic laparoscopy.  He is not toxic nor sickly at this time and feels much better.  NG tube output has gone down.  We will see.  VTE prophylaxis- SCDs, etc  mobilize as tolerated to help recovery  -Disposition: TBD       I reviewed nursing notes, ED provider notes, hospitalist notes, last 24  h vitals and pain scores, last 48 h intake and output, last 24 h labs and trends, and last 24 h imaging results.  I have reviewed this patient's available data, including medical history, events of note, test results, etc as part of my evaluation.   A significant portion of that time was spent in counseling. Care during the described time interval was provided by me.  This care required moderate level of medical decision making.  09/27/2023    Subjective: (Chief complaint)  Patient denies any abdominal pain.  Feels much better overall.  Thinks he is starting to pass gas.  Objective:  Vital signs:  Vitals:   09/26/23 1326 09/26/23 1706 09/26/23 2102 09/27/23 0544  BP: 132/88 122/76 130/70 116/65  Pulse: 70 83 82 86  Resp: 18 17 18 18   Temp: (!) 97.5 F (36.4 C) 97.7 F (36.5 C) 97.7 F (36.5 C) 97.9 F (36.6 C)  TempSrc:  Oral Oral Oral  SpO2: 93% 99% 97% 93%  Weight:      Height:        Last BM Date : 09/24/23  Intake/Output   Yesterday:  12/26 0701 - 12/27 0700 In: 1337 [I.V.:1237; IV Piggyback:100] Out: 3150 [Urine:1350; Emesis/NG output:1800] This shift:  Total I/O In: -  Out: 100 [Urine:100]  Bowel function:  Flatus: YES  BM:  No  Drain: Nasogastric tube with thin dark green effluent in canister.   Physical Exam:  General: Pt awake/alert in no acute distress Eyes: PERRL, normal EOM.  Sclera clear.  No icterus Neuro: CN II-XII intact w/o focal sensory/motor deficits. Lymph: No head/neck/groin lymphadenopathy Psych:  No delerium/psychosis/paranoia.  Oriented x 3 HENT: Normocephalic, Mucus membranes moist.  No thrush Neck: Supple, No tracheal deviation.  No obvious thyromegaly Chest: No pain to chest wall compression.  Good respiratory excursion.  No audible wheezing CV:  Pulses intact.  Regular rhythm.  No major extremity edema MS: Normal AROM mjr joints.  No obvious deformity  Abdomen: Soft.  Mildy distended.  Nontender.  No evidence of  peritonitis.  No incarcerated hernias.  Ext:   No deformity.  No mjr edema.  No cyanosis Skin: No petechiae / purpurea.  No major sores.  Warm and dry    Results:   Cultures: Recent Results (from the past 720 hours)  Culture, blood (Routine X 2) w Reflex to ID Panel     Status: None (Preliminary result)   Collection Time: 09/25/23  9:30 PM   Specimen: BLOOD RIGHT FOREARM  Result Value Ref Range Status   Specimen Description   Final    BLOOD RIGHT FOREARM Performed at [facility], [address], [place], [place] [address]    Special Requests   Final    BOTTLES DRAWN AEROBIC AND ANAEROBIC Blood Culture results may not be optimal due to an inadequate volume of blood received in culture bottles Performed at [facility], [address], [place], [place] [address]    Culture   Final    NO GROWTH 2 DAYS Performed at [facility], [address], [place], [place] [address]    Report Status PENDING  Incomplete  Culture, blood (Routine X 2) w Reflex to ID Panel     Status: None (Preliminary result)   Collection Time: 09/25/23  9:40 PM   Specimen: BLOOD  Result Value Ref Range Status   Specimen Description   Final    BLOOD RIGHT ANTECUBITAL Performed at [facility], [address] [place], [place] [address]    Special Requests   Final    BOTTLES DRAWN AEROBIC AND ANAEROBIC Blood Culture adequate volume Performed at [facility], [address], [place], [place] [address]    Culture   Final    NO GROWTH 2 DAYS Performed at [facility], [address], [place], [place] [address]    Report Status PENDING  Incomplete    Labs: Results for orders placed or performed during the hospital encounter of 09/25/23 (from the past 48 hours)  CBC with Differential     Status: Abnormal   Collection Time: 09/25/23  3:30 PM  Result Value Ref Range   WBC 16.0 (H) 4.0 - 10.5 K/uL   RBC 5.19 4.22 - 5.81  MIL/uL   Hemoglobin 15.4 13.0 - 17.0 g/dL   HCT 57.8 46.9 - 62.9 %   MCV 86.3 80.0 - 100.0 fL   MCH 29.7 26.0 - 34.0 pg   MCHC 34.4 30.0 - 36.0 g/dL   RDW 52.8 41.3 - 24.4 %   Platelets 307 150 - 400 K/uL   nRBC 0.0 0.0 - 0.2 %   Neutrophils Relative % 88 %   Neutro Abs 13.9 (H) 1.7 - 7.7 K/uL   Lymphocytes Relative 7 %   Lymphs Abs 1.2 0.7 - 4.0 K/uL   Monocytes Relative 5 %   Monocytes Absolute 0.8 0.1 - 1.0 K/uL   Eosinophils Relative 0 %   Eosinophils Absolute 0.0 0.0 - 0.5 K/uL  Basophils Relative 0 %   Basophils Absolute 0.0 0.0 - 0.1 K/uL   Immature Granulocytes 0 %   Abs Immature Granulocytes 0.07 0.00 - 0.07 K/uL    Comment: Performed at [facility], [address] [place], [place] [address]  Comprehensive metabolic panel     Status: Abnormal   Collection Time: 09/25/23  5:11 PM  Result Value Ref Range   Sodium 127 (L) 135 - 145 mmol/L   Potassium 5.0 3.5 - 5.1 mmol/L   Chloride 95 (L) 98 - 111 mmol/L   CO2 18 (L) 22 - 32 mmol/L   Glucose, Bld 188 (H) 70 - 99 mg/dL    Comment: Glucose reference range applies only to samples taken after fasting for at least 8 hours.   BUN 48 (H) 8 - 23 mg/dL   Creatinine, Ser 1.32 (H) 0.61 - 1.24 mg/dL   Calcium 9.1 8.9 - 44.0 mg/dL   Total Protein 6.9 6.5 - 8.1 g/dL   Albumin 3.7 3.5 - 5.0 g/dL   AST 17 15 - 41 U/L   ALT 10 0 - 44 U/L   Alkaline Phosphatase 105 38 - 126 U/L   Total Bilirubin 0.9 <1.2 mg/dL   GFR, Estimated 14 (L) >60 mL/min    Comment: (NOTE) Calculated using the CKD-EPI Creatinine Equation (2021)    Anion gap 14 5 - 15    Comment: Performed at [facility], [address] [place], [place] [address]  Urinalysis, Routine w reflex microscopic -Urine, Clean Catch     Status: Abnormal   Collection Time: 09/25/23  7:36 PM  Result Value Ref Range   Color, Urine AMBER (A) YELLOW    Comment: BIOCHEMICALS MAY BE AFFECTED BY COLOR   APPearance CLOUDY (A) CLEAR   Specific  Gravity, Urine 1.017 1.005 - 1.030   pH 5.0 5.0 - 8.0   Glucose, UA 50 (A) NEGATIVE mg/dL   Hgb urine dipstick SMALL (A) NEGATIVE   Bilirubin Urine NEGATIVE NEGATIVE   Ketones, ur NEGATIVE NEGATIVE mg/dL   Protein, ur 30 (A) NEGATIVE mg/dL   Nitrite NEGATIVE NEGATIVE   Leukocytes,Ua MODERATE (A) NEGATIVE   RBC / HPF 11-20 0 - 5 RBC/hpf   WBC, UA >50 0 - 5 WBC/hpf   Bacteria, UA NONE SEEN NONE SEEN   Squamous Epithelial / HPF 6-10 0 - 5 /HPF   Mucus PRESENT    Hyaline Casts, UA PRESENT    Sperm, UA PRESENT     Comment: Performed at [facility], [address] [place], [place] [address]  Sodium, urine, random     Status: None   Collection Time: 09/25/23  7:36 PM  Result Value Ref Range   Sodium, Ur 34 mmol/L    Comment: Performed at [facility], [address] [place], [place] [address]  Creatinine, urine, random     Status: None   Collection Time: 09/25/23  7:36 PM  Result Value Ref Range   Creatinine, Urine 186 mg/dL    Comment: Performed at [facility], [address] [place], [place] [address]  Culture, blood (Routine X 2) w Reflex to ID Panel     Status: None (Preliminary result)   Collection Time: 09/25/23  9:30 PM   Specimen: BLOOD RIGHT FOREARM  Result Value Ref Range   Specimen Description      BLOOD RIGHT FOREARM Performed at [facility], [address], [place], [place] [address]  Special Requests      BOTTLES DRAWN AEROBIC AND ANAEROBIC Blood Culture results may not be optimal due to an inadequate volume of blood received in culture bottles Performed at [facility], [address], [place], [place] [address]    Culture      NO GROWTH 2 DAYS Performed at [facility], [address] [place], [place] [address]    Report Status PENDING   Culture, blood (Routine X 2) w Reflex to ID Panel     Status: None (Preliminary result)   Collection Time: 09/25/23   9:40 PM   Specimen: BLOOD  Result Value Ref Range   Specimen Description      BLOOD RIGHT ANTECUBITAL Performed at [facility], [address], [place], [place] [address]    Special Requests      BOTTLES DRAWN AEROBIC AND ANAEROBIC Blood Culture adequate volume Performed at [facility], [address], [place], [place] [address]    Culture      NO GROWTH 2 DAYS Performed at [facility], [address] [place], [place] [address]    Report Status PENDING   Magnesium     Status: None   Collection Time: 09/25/23  9:40 PM  Result Value Ref Range   Magnesium 2.0 1.7 - 2.4 mg/dL    Comment: Performed at [facility], [address] [place], [place] [address]  Troponin I (High Sensitivity)     Status: None   Collection Time: 09/25/23  9:40 PM  Result Value Ref Range   Troponin I (High Sensitivity) 12 <18 ng/L    Comment: (NOTE) Elevated high sensitivity troponin I (hsTnI) values and significant  changes across serial measurements may suggest ACS but many other  chronic and acute conditions are known to elevate hsTnI results.  Refer to the "Links" section for chest pain algorithms and additional  guidance. Performed at [facility], [address] [place], [place] [address]   CBC     Status: None   Collection Time: 09/25/23  9:40 PM  Result Value Ref Range   WBC 10.3 4.0 - 10.5 K/uL   RBC 5.19 4.22 - 5.81 MIL/uL   Hemoglobin 15.6 13.0 - 17.0 g/dL   HCT 64.3 32.9 - 51.8 %   MCV 86.7 80.0 - 100.0 fL   MCH 30.1 26.0 - 34.0 pg   MCHC 34.7 30.0 - 36.0 g/dL   RDW 84.1 66.0 - 63.0 %   Platelets 306 150 - 400 K/uL   nRBC 0.0 0.0 - 0.2 %    Comment: Performed at [facility], [address] [place], [place] [address]  Basic metabolic panel     Status: Abnormal   Collection Time: 09/25/23  9:40 PM  Result Value Ref Range   Sodium 127 (L) 135 - 145 mmol/L   Potassium 4.6 3.5 -  5.1 mmol/L   Chloride 96 (L) 98 - 111 mmol/L   CO2 19 (L) 22 - 32 mmol/L   Glucose, Bld 132 (H) 70 - 99 mg/dL    Comment: Glucose reference range applies only to samples taken after fasting for at least 8 hours.   BUN 47 (H) 8 - 23 mg/dL   Creatinine, Ser 9.32 (H) 0.61 - 1.24 mg/dL   Calcium 8.6 (L) 8.9 - 10.3 mg/dL   GFR, Estimated 16 (L) >60 mL/min    Comment: (NOTE) Calculated using the CKD-EPI Creatinine Equation (2021)  Anion gap 12 5 - 15    Comment: Performed at [facility], [address], [place], [place] [address]  I-Stat CG4 Lactic Acid     Status: Abnormal   Collection Time: 09/25/23 10:02 PM  Result Value Ref Range   Lactic Acid, Venous 2.0 (HH) 0.5 - 1.9 mmol/L  APTT     Status: None   Collection Time: 09/26/23  4:44 AM  Result Value Ref Range   aPTT 29 24 - 36 seconds    Comment: Performed at [facility], [address], [place], [place] [address]  Protime-INR     Status: Abnormal   Collection Time: 09/26/23  4:44 AM  Result Value Ref Range   Prothrombin Time 15.6 (H) 11.4 - 15.2 seconds   INR 1.2 0.8 - 1.2    Comment: (NOTE) INR goal varies based on device and disease states. Performed at [facility], [address], [place], [place] [address]   Basic metabolic panel     Status: Abnormal   Collection Time: 09/26/23  4:44 AM  Result Value Ref Range   Sodium 128 (L) 135 - 145 mmol/L   Potassium 4.6 3.5 - 5.1 mmol/L   Chloride 95 (L) 98 - 111 mmol/L   CO2 22 22 - 32 mmol/L   Glucose, Bld 191 (H) 70 - 99 mg/dL    Comment: Glucose reference range applies only to samples taken after fasting for at least 8 hours.   BUN 44 (H) 8 - 23 mg/dL   Creatinine, Ser 0.93 (H) 0.61 - 1.24 mg/dL   Calcium 8.6 (L) 8.9 - 10.3 mg/dL   GFR, Estimated 20 (L) >60 mL/min    Comment: (NOTE) Calculated using the CKD-EPI Creatinine Equation (2021)    Anion gap 11 5 - 15    Comment: Performed at [facility], [address] [place], [place] [address]  CBC     Status: None   Collection Time: 09/26/23  4:44 AM  Result Value Ref Range   WBC 6.9 4.0 - 10.5 K/uL   RBC 5.05 4.22 - 5.81 MIL/uL   Hemoglobin 15.2 13.0 - 17.0 g/dL   HCT 93.7 16.9 - 67.8 %   MCV 87.1 80.0 - 100.0 fL   MCH 30.1 26.0 - 34.0 pg   MCHC 34.5 30.0 - 36.0 g/dL   RDW 93.8 10.1 - 75.1 %   Platelets 271 150 - 400 K/uL   nRBC 0.0 0.0 - 0.2 %    Comment: Performed at [facility], [address], [place], [place] [address]  Osmolality     Status: Abnormal   Collection Time: 09/26/23  4:44 AM  Result Value Ref Range   Osmolality 302 (H) 275 - 295 mOsm/kg    Comment: Performed at [facility], [address], [place], [place] [address]  Basic metabolic panel     Status: Abnormal   Collection Time: 09/27/23  4:25 AM  Result Value Ref Range   Sodium 132 (L) 135 - 145 mmol/L   Potassium 2.9 (L) 3.5 - 5.1 mmol/L    Comment: DELTA CHECK NOTED   Chloride 86 (L) 98 - 111 mmol/L   CO2 34 (H) 22 - 32 mmol/L   Glucose, Bld 158 (H) 70 - 99 mg/dL    Comment: Glucose reference range applies only to samples taken after fasting for at least 8 hours.   BUN 25 (H) 8 - 23 mg/dL  Creatinine, Ser 1.39 (H) 0.61 - 1.24 mg/dL    Comment: DELTA CHECK NOTED   Calcium 8.2 (L) 8.9 - 10.3 mg/dL   GFR, Estimated 53 (L) >60 mL/min    Comment: (NOTE) Calculated using the CKD-EPI Creatinine Equation (2021)    Anion gap 12 5 - 15    Comment: Performed at [facility], [address], [place], [place] [address]    Imaging / Studies: DG Abd Portable 1V-Small Bowel Obstruction Protocol-initial, 8 hr delay Result Date: 09/26/2023 CLINICAL DATA:  8 hour delayed small-bowel follow-through. EXAM: PORTABLE ABDOMEN - 1 VIEW COMPARISON:  Film yesterday at 9:33 p.m., and CT yesterday at 7:12 p.m. FINDINGS: Supine portable AP study of the abdomen was obtained at 7:18 a.m. NGT is in the stomach with the tip  in the proximal lumen with the side hole at about the EG junction and should be advanced further in 8-10 cm. There is contrast in the gastric fundus and faintly opacifying dilated small bowel segments in the upper to mid abdomen. There are no visible opacified decompressed bowel segments in the lower abdomen or pelvis and there is no opacification in the ascending colon. Small bowel dilatation in the upper to mid abdomen is little if any changed measuring 4.7 cm. Findings remain consistent with a high-grade obstruction to the mid to distal small bowel. Moderate retained stool continues to be seen in the ascending colon. There is no supine evidence for free air. Right hip arthroplasty with degenerative change of the spine and osteopenia. Lung bases are clear.  There is aortic atherosclerosis. IMPRESSION: 1. NGT tip in the proximal stomach with the side hole at about the EG junction and should be advanced further in 8-10 cm. 2. Contrast in the gastric fundus and faintly opacifying dilated small bowel segments in the upper to mid abdomen. No visible opacified decompressed bowel segments in the lower abdomen or pelvis and no opacification in the ascending colon. 3. Small bowel dilatation in the upper to mid abdomen is little if any changed measuring 4.7 cm. 4. Findings remain consistent with a high-grade mid to distal SBO. 5. Moderate retained stool in the ascending colon. 6. Aortic atherosclerosis. Electronically Signed   By: Almira Bar M.D.   On: 09/26/2023 07:49   DG CHEST PORT 1 VIEW Result Date: 09/25/2023 CLINICAL DATA:  NG tube positioning. EXAM: PORTABLE CHEST 1 VIEW COMPARISON:  June 14, 2023 FINDINGS: An enteric tube is seen with its distal tip noted within the body of the stomach. Its distal side hole is approximately 4.3 cm distal to the expected region of the gastroesophageal junction. The heart size and mediastinal contours are within normal limits. Very mild atelectasis and/or infiltrate is  suspected within the lateral aspect of the right lung base. No pleural effusion or pneumothorax is identified. A chronic ninth right rib fracture is seen. IMPRESSION: 1. Enteric tube positioning, as described above. 2. Very mild right basilar atelectasis and/or infiltrate. Electronically Signed   By: Aram Candela M.D.   On: 09/25/2023 22:02   DG Abd Portable 1V-Small Bowel Protocol-Position Verification Result Date: 09/25/2023 CLINICAL DATA:  Enteric tube placement EXAM: PORTABLE ABDOMEN - 1 VIEW COMPARISON:  Earlier same day abdominal radiograph at 8:25 p.m. FINDINGS: Interval advancement of the enteric tube with tip projecting over the left upper quadrant. The side hole now projects over the gastric cardia. Partially imaged small bowel dilation. IMPRESSION: Interval advancement of the enteric tube with tip projecting over the left upper quadrant. The  side hole now projects over the gastric cardia. Further advancement can be considered. Electronically Signed   By: Agustin Cree M.D.   On: 09/25/2023 21:59   DG Abd Portable 1V-Small Bowel Protocol-Position Verification Result Date: 09/25/2023 CLINICAL DATA:  Enteric tube placement EXAM: PORTABLE ABDOMEN - 1 VIEW COMPARISON:  CT abdomen dated 09/25/2023 FINDINGS: Gastric/enteric tube tip projects over the stomach with side hole projecting over the gastroesophageal junction. Partially imaged dilated small bowel throughout the abdomen. IMPRESSION: Gastric/enteric tube tip projects over the stomach with side hole projecting over the gastroesophageal junction. Recommend advancing by at least 5 cm for more optimal positioning. Electronically Signed   By: Agustin Cree M.D.   On: 09/25/2023 20:39   CT ABDOMEN PELVIS WO CONTRAST Result Date: 09/25/2023 CLINICAL DATA:  Nausea, vomiting.  Abdominal pain. EXAM: CT ABDOMEN AND PELVIS WITHOUT CONTRAST TECHNIQUE: Multidetector CT imaging of the abdomen and pelvis was performed following the standard protocol without IV  contrast. RADIATION DOSE REDUCTION: This exam was performed according to the departmental dose-optimization program which includes automated exposure control, adjustment of the mA and/or kV according to patient size and/or use of iterative reconstruction technique. COMPARISON:  05/12/2023 FINDINGS: Lower chest: Dependent atelectasis in the lower lobes. Coronary artery and aortic atherosclerosis. Hepatobiliary: No focal hepatic abnormality. Gallbladder unremarkable. Pancreas: No focal abnormality or ductal dilatation. Spleen: No focal abnormality.  Normal size. Adrenals/Urinary Tract: Adrenal glands unremarkable. Severely atrophic right kidney. Stable cyst in the upper pole of the right kidney and cyst in the mid to lower pole of the left kidney. No follow-up imaging recommended. No stones or hydronephrosis. Urinary bladder unremarkable. Stomach/Bowel: Dilated fluid-filled small bowel loops into the pelvis. Distal small bowel is decompressed. Findings compatible with distal small bowel obstruction. Exact transition point not visualized. Stomach and large bowel unremarkable. Vascular/Lymphatic: Aortic atherosclerosis. No evidence of aneurysm or adenopathy. Reproductive: No visible focal abnormality. Other: No free fluid or free air. Musculoskeletal: No acute bony abnormality. IMPRESSION: Dilated fluid-filled small bowel loops into the pelvis. Distal small bowel decompressed. Findings compatible with distal small bowel obstruction. Aortic atherosclerosis, coronary artery disease. Dependent atelectasis in the lower lobes. Electronically Signed   By: Charlett Nose M.D.   On: 09/25/2023 19:44    Medications / Allergies: per chart  Antibiotics: Anti-infectives (From admission, onward)    Start     Dose/Rate Route Frequency Ordered Stop   09/25/23 2200  metroNIDAZOLE (FLAGYL) IVPB 500 mg  Status:  Discontinued        500 mg 100 mL/hr over 60 Minutes Intravenous Every 12 hours 09/25/23 2059 09/26/23 1142    09/25/23 2115  cefTRIAXone (ROCEPHIN) 2 g in sodium chloride 0.9 % 100 mL IVPB  Status:  Discontinued        2 g 200 mL/hr over 30 Minutes Intravenous Every 24 hours 09/25/23 2059 09/26/23 1142         Note: Portions of this report may have been transcribed using voice recognition software. Every effort was made to ensure accuracy; however, inadvertent computerized transcription errors may be present.   Any transcriptional errors that result from this process are unintentional.    Ardeth Sportsman, MD, FACS, MASCRS Esophageal, Gastrointestinal & Colorectal Surgery Robotic and Minimally Invasive Surgery  [facility] [address], [address] [place], [place] [address] [phone] Fax [phone] Main  CONTACT INFORMATION: Weekday (9AM-5PM): Call [facility] main office at [phone] Weeknight (5PM-9AM) or Weekend/Holiday: Check EPIC "Web Links" tab &  use "AMION" (password " [email or web address]") for General Surgery [facility] coverage  Please, DO NOT use SecureChat  (it is not reliable communication to reach operating surgeons & will lead to a delay in care).   Epic staff messaging available for outptient concerns needing 1-2 business day response.      09/27/2023  8:20 AM

## 2023-09-28 ENCOUNTER — Encounter (HOSPITAL_COMMUNITY): Payer: Self-pay | Admitting: Internal Medicine

## 2023-09-28 ENCOUNTER — Inpatient Hospital Stay (HOSPITAL_COMMUNITY): Payer: Medicare Other

## 2023-09-28 DIAGNOSIS — E876 Hypokalemia: Secondary | ICD-10-CM | POA: Insufficient documentation

## 2023-09-28 DIAGNOSIS — Z4682 Encounter for fitting and adjustment of non-vascular catheter: Secondary | ICD-10-CM | POA: Diagnosis not present

## 2023-09-28 DIAGNOSIS — K5669 Other partial intestinal obstruction: Secondary | ICD-10-CM | POA: Diagnosis not present

## 2023-09-28 DIAGNOSIS — K56609 Unspecified intestinal obstruction, unspecified as to partial versus complete obstruction: Secondary | ICD-10-CM | POA: Diagnosis not present

## 2023-09-28 LAB — URINE CULTURE: Culture: >=100000 — AB

## 2023-09-28 LAB — MAGNESIUM: Magnesium: 2 mg/dL (ref 1.7–2.4)

## 2023-09-28 LAB — BASIC METABOLIC PANEL
Anion gap: 13 (ref 5–15)
BUN: 22 mg/dL (ref 8–23)
CO2: 29 mmol/L (ref 22–32)
Calcium: 8.3 mg/dL — ABNORMAL LOW (ref 8.9–10.3)
Chloride: 92 mmol/L — ABNORMAL LOW (ref 98–111)
Creatinine, Ser: 1.43 mg/dL — ABNORMAL HIGH (ref 0.61–1.24)
GFR, Estimated: 51 mL/min — ABNORMAL LOW (ref >60)
Glucose, Bld: 87 mg/dL (ref 70–99)
Potassium: 3 mmol/L — ABNORMAL LOW (ref 3.5–5.1)
Sodium: 134 mmol/L — ABNORMAL LOW (ref 135–145)

## 2023-09-28 MED ORDER — POTASSIUM CHLORIDE 10 MEQ/100ML IV SOLN
10.0000 meq | INTRAVENOUS | Status: DC
  Administered 2023-09-28 (×2): 10 meq via INTRAVENOUS
  Filled 2023-09-28 (×3): qty 100

## 2023-09-28 MED ORDER — KCL IN DEXTROSE-NACL 40-5-0.45 MEQ/L-%-% IV SOLN
INTRAVENOUS | Status: DC
  Filled 2023-09-28 (×2): qty 1000

## 2023-09-28 MED ORDER — CARMEX CLASSIC LIP BALM EX OINT
TOPICAL_OINTMENT | CUTANEOUS | Status: DC | PRN
  Administered 2023-09-28: 1 via TOPICAL
  Filled 2023-09-28: qty 10

## 2023-09-28 MED ORDER — LACTATED RINGERS IV BOLUS: 1000.0000 mL | Freq: Three times a day (TID) | INTRAVENOUS | Status: DC | PRN

## 2023-09-28 NOTE — Progress Notes (Signed)
09/28/2023  [name] [id number] 12/13/1945  CARE TEAM: PCP: Jeoffrey Massed, MD  Outpatient Care Team: Patient Care Team: Jeoffrey Massed, MD as PCP - General (Family Medicine) Leilani Able (Dentistry) Fredrich Birks, OD as Consulting Physician (Optometry) Annie Sable, MD as Consulting Physician (Nephrology) [facility], Rachelle Hora, MD as Consulting Physician (Cardiology) Ricky Stabs, RN as VBCI Care Management (General Practice)  Inpatient Treatment Team: Treatment Team:  Noralee Stain, DO Lilyan Gilford, MD Montez Morita, Md, MD Diona Browner, RN Deveron Furlong, RN Laural Benes [facility]   Problem List:   Principal Problem:   SBO (small bowel obstruction) [facility]) Active Problems:   Hyponatremia   Leukocytosis   AKI (acute kidney injury) (HCC)   * No surgery found *      Assessment [facility] Stay = 3 days)      Small bowel obstruction Transition point down the pelvis.  No prior surgeries but there is no mass or tumor or suspicious.  Most likely some omental and other adhesions causing a transition point.    Plan:  Patient feeling better overall.  His abdomen is soft and nontender.  He is not toxic at this time.  With his history of TIAs, I do not know how reliable of a narrator that he is, but seems to have some objective proof things are not worsening.  Continue small bowel protocol with nasogastric tube decompression.  Continue abdominal exams.  Patient claims to have flatus and bowel movements which are documented but NG tube output still moderately high.  Will get x-ray films.  If contrast in colon, then consider NGT clamping trial w clears  Hypokalemia getting replaced.  Will switch IV fluids to add potassium as long as primary service agrees.  No evidence of any hypomagnesemia  If films not improved by tomorrow morning,  may require operative exploration since he has had no prior surgeries.  Perhaps can start out with  diagnostic laparoscopy.  He is not toxic nor sickly at this time and feels much better.  NG tube output has gone down.  We will see.  VTE prophylaxis- SCDs, etc  mobilize as tolerated to help recovery  -Disposition: TBD       I reviewed nursing notes, ED provider notes, hospitalist notes, last 24 h vitals and pain scores, last 48 h intake and output, last 24 h labs and trends, and last 24 h imaging results.  I have reviewed this patient's available data, including medical history, events of note, test results, etc as part of my evaluation.   A significant portion of that time was spent in counseling. Care during the described time interval was provided by me.  This care required moderate level of medical decision making.  09/28/2023    Subjective: (Chief complaint)  Patient denies any abdominal pain.  Feels much better overall.  Claims to have loose bowel movements.  Nurse Rosey Bath in room.  Objective:  Vital signs:  Vitals:   09/27/23 0544 09/27/23 1314 09/27/23 2041 09/28/23 0548  BP: 116/65 105/61 119/66 100/71  Pulse: 86 74 97 83  Resp: 18 15 18 16   Temp: 97.9 F (36.6 C) 97.7 F (36.5 C) 99.6 F (37.6 C) 97.7 F (36.5 C)  TempSrc: Oral  Oral Oral  SpO2: 93% 97% 97% 98%  Weight:      Height:        Last BM Date : 09/27/23  Intake/Output   Yesterday:  12/27 0701 - 12/28 0700 In: 1589.6 [P.O.:30; I.V.:1057.8; IV  Piggyback:501.9] Out: 2052 [Urine:750; Emesis/NG output:1300; Stool:2] This shift:  No intake/output data recorded.  Bowel function:  Flatus: YES  BM:  No  Drain: Nasogastric tube with thin dark green effluent in canister.   Physical Exam:  General: Pt awake/alert in no acute distress Eyes: PERRL, normal EOM.  Sclera clear.  No icterus Neuro: CN II-XII intact w/o focal sensory/motor deficits. Lymph: No head/neck/groin lymphadenopathy Psych:  No delerium/psychosis/paranoia.  Oriented x 3 HENT: Normocephalic, Mucus membranes moist.   No thrush Neck: Supple, No tracheal deviation.  No obvious thyromegaly Chest: No pain to chest wall compression.  Good respiratory excursion.  No audible wheezing CV:  Pulses intact.  Regular rhythm.  No major extremity edema MS: Normal AROM mjr joints.  No obvious deformity  Abdomen: Soft.  Nondistended.  Nontender.  No evidence of peritonitis.  No incarcerated hernias.  Ext:   No deformity.  No mjr edema.  No cyanosis Skin: No petechiae / purpurea.  No major sores.  Warm and dry    Results:   Cultures: Recent Results (from the past 720 hours)  Urine Culture     Status: Abnormal (Preliminary result)   Collection Time: 09/25/23  7:36 PM   Specimen: Urine, Clean Catch  Result Value Ref Range Status   Specimen Description   Final    URINE, CLEAN CATCH Performed at [facility], [address] [place], [place] [address]    Special Requests   Final    NONE Performed at [facility], [address] [place], [place] [address]    Culture (A)  Final    >=100,000 COLONIES/mL STAPHYLOCOCCUS AUREUS SUSCEPTIBILITIES TO FOLLOW Performed at [facility], [address], [place], [place] [address]    Report Status PENDING  Incomplete  Culture, blood (Routine X 2) w Reflex to ID Panel     Status: None (Preliminary result)   Collection Time: 09/25/23  9:30 PM   Specimen: BLOOD RIGHT FOREARM  Result Value Ref Range Status   Specimen Description   Final    BLOOD RIGHT FOREARM Performed at [facility], [address] [place], [place] [address]    Special Requests   Final    BOTTLES DRAWN AEROBIC AND ANAEROBIC Blood Culture results may not be optimal due to an inadequate volume of blood received in culture bottles Performed at [facility], [address], [place], [place] [address]    Culture   Final    NO GROWTH 3 DAYS Performed at [facility], [address], [place], [place] [address]     Report Status PENDING  Incomplete  Culture, blood (Routine X 2) w Reflex to ID Panel     Status: None (Preliminary result)   Collection Time: 09/25/23  9:40 PM   Specimen: BLOOD  Result Value Ref Range Status   Specimen Description   Final    BLOOD RIGHT ANTECUBITAL Performed at [facility], [address], [place], [place] [address]    Special Requests   Final    BOTTLES DRAWN AEROBIC AND ANAEROBIC Blood Culture adequate volume Performed at [facility], [address], [place], [place] [address]    Culture   Final    NO GROWTH 3 DAYS Performed at [facility], [address], [place], [place] [address]    Report Status PENDING  Incomplete    Labs: Results for orders placed or performed during the hospital encounter  of 09/25/23 (from the past 48 hours)  Basic metabolic panel     Status: Abnormal   Collection Time: 09/27/23  4:25 AM  Result Value Ref Range   Sodium 132 (L) 135 - 145 mmol/L   Potassium 2.9 (L) 3.5 - 5.1 mmol/L    Comment: DELTA CHECK NOTED   Chloride 86 (L) 98 - 111 mmol/L   CO2 34 (H) 22 - 32 mmol/L   Glucose, Bld 158 (H) 70 - 99 mg/dL    Comment: Glucose reference range applies only to samples taken after fasting for at least 8 hours.   BUN 25 (H) 8 - 23 mg/dL   Creatinine, Ser 1.61 (H) 0.61 - 1.24 mg/dL    Comment: DELTA CHECK NOTED   Calcium 8.2 (L) 8.9 - 10.3 mg/dL   GFR, Estimated 53 (L) >60 mL/min    Comment: (NOTE) Calculated using the CKD-EPI Creatinine Equation (2021)    Anion gap 12 5 - 15    Comment: Performed at [facility], [address] [place], [place] [address]  Urinalysis, Routine w reflex microscopic -Urine, Clean Catch     Status: Abnormal   Collection Time: 09/27/23  2:28 PM  Result Value Ref Range   Color, Urine YELLOW YELLOW   APPearance CLEAR CLEAR   Specific Gravity, Urine 1.015 1.005 - 1.030   pH 9.0 (H) 5.0 - 8.0   Glucose, UA 50 (A) NEGATIVE mg/dL   Hgb  urine dipstick NEGATIVE NEGATIVE   Bilirubin Urine NEGATIVE NEGATIVE   Ketones, ur NEGATIVE NEGATIVE mg/dL   Protein, ur 540 (A) NEGATIVE mg/dL   Nitrite NEGATIVE NEGATIVE   Leukocytes,Ua NEGATIVE NEGATIVE   RBC / HPF 0-5 0 - 5 RBC/hpf   WBC, UA 0-5 0 - 5 WBC/hpf   Bacteria, UA NONE SEEN NONE SEEN   Squamous Epithelial / HPF 0-5 0 - 5 /HPF    Comment: Performed at [facility], [address], [place], [place] [address]  Basic metabolic panel     Status: Abnormal   Collection Time: 09/28/23  5:15 AM  Result Value Ref Range   Sodium 134 (L) 135 - 145 mmol/L   Potassium 3.0 (L) 3.5 - 5.1 mmol/L   Chloride 92 (L) 98 - 111 mmol/L   CO2 29 22 - 32 mmol/L   Glucose, Bld 87 70 - 99 mg/dL    Comment: Glucose reference range applies only to samples taken after fasting for at least 8 hours.   BUN 22 8 - 23 mg/dL   Creatinine, Ser 1.47 (H) 0.61 - 1.24 mg/dL   Calcium 8.3 (L) 8.9 - 10.3 mg/dL   GFR, Estimated 51 (L) >60 mL/min    Comment: (NOTE) Calculated using the CKD-EPI Creatinine Equation (2021)    Anion gap 13 5 - 15    Comment: Performed at [facility], [address] [place], [place] [address]  Magnesium     Status: None   Collection Time: 09/28/23  5:15 AM  Result Value Ref Range   Magnesium 2.0 1.7 - 2.4 mg/dL    Comment: Performed at [facility], [address], [place], [place] [address]    Imaging / Studies: DG Abd Portable 1V Result Date: 09/27/2023 CLINICAL DATA:  Small bowel obstruction. EXAM: PORTABLE ABDOMEN - 1 VIEW COMPARISON:  09/26/2023 FINDINGS: Nasogastric tube tip is in the gastric fundus region. Persistent dilated loops of small bowel in the mid abdomen that have minimally changed. Again noted  is a large amount of stool in the right colon. Patient has a right hip arthroplasty. Aortic atherosclerotic calcifications. IMPRESSION: 1. Persistent dilated loops of small bowel in the mid abdomen, minimal change  from the exam on 09/26/2023. Findings are compatible with small bowel obstruction. 2. Large amount of stool in the right colon. Electronically Signed   By: Richarda Overlie M.D.   On: 09/27/2023 09:09    Medications / Allergies: per chart  Antibiotics: Anti-infectives (From admission, onward)    Start     Dose/Rate Route Frequency Ordered Stop   09/25/23 2200  metroNIDAZOLE (FLAGYL) IVPB 500 mg  Status:  Discontinued        500 mg 100 mL/hr over 60 Minutes Intravenous Every 12 hours 09/25/23 2059 09/26/23 1142   09/25/23 2115  cefTRIAXone (ROCEPHIN) 2 g in sodium chloride 0.9 % 100 mL IVPB  Status:  Discontinued        2 g 200 mL/hr over 30 Minutes Intravenous Every 24 hours 09/25/23 2059 09/26/23 1142         Note: Portions of this report may have been transcribed using voice recognition software. Every effort was made to ensure accuracy; however, inadvertent computerized transcription errors may be present.   Any transcriptional errors that result from this process are unintentional.    Ardeth Sportsman, MD, FACS, MASCRS Esophageal, Gastrointestinal & Colorectal Surgery Robotic and Minimally Invasive Surgery  [facility] [address] [place], [place] [address] [phone] Fax [phone] Main  CONTACT INFORMATION: Weekday (9AM-5PM): Call [facility] main office at [phone] Weeknight (5PM-9AM) or Weekend/Holiday: Check EPIC "Web Links" tab & use "AMION" (password " [email or web address]") for General Surgery [facility] coverage  Please, DO NOT use SecureChat  (it is not reliable communication to reach operating surgeons & will lead to a delay in care).   Epic staff messaging available for outptient concerns needing 1-2 business day response.      09/28/2023  8:50 AM

## 2023-09-28 NOTE — Plan of Care (Signed)
  Problem: Clinical Measurements: Goal: Respiratory complications will improve Outcome: Progressing   

## 2023-09-28 NOTE — Progress Notes (Signed)
PROGRESS NOTE    Antonio Serrano  BMW:[id number] DOB: 1946/02/25 DOA: 09/25/2023 PCP: Jeoffrey Massed, MD     Brief Narrative:  Antonio Serrano is a 77 y.o. male with medical history significant of no reported intra-abdominal surgery.  Patient was in his usual state of health till approximately day before yesterday when he reports some mild onset of crampy abdominal pain and more than 10 episodes of vomiting. Patient has been unable to tolerate p.o. diet however because of nausea.  In the ER patient's CAT scan is concerning for small bowel obstruction, patient has had nasogastric tube placed with about 850 cc of dark brown/feculent aspiration thus far.  General surgery was consulted.  New events last 24 hours / Subjective: Passing gas and having bowel movement x2. NG output recorded yesterday. No complaints of abd pain   Assessment & Plan:   Principal Problem:   SBO (small bowel obstruction) (HCC) Active Problems:   Chronic renal insufficiency, stage III (moderate) (HCC)   Type 2 diabetes mellitus with stage 3a chronic kidney disease, without long-term current use of insulin (HCC)   Constipation, chronic   Hyponatremia   Leukocytosis   AKI (acute kidney injury) (HCC)   Hypokalemia  SBO -NG tube in place  -Continue SBO protocol -General Surgery following -Continue IV fluid   AKI -Baseline creatinine 1.3 -Resolving with IV fluid  Hypokalemia -Replace  Holding home oral medications including Norvasc, Plavix, Myrbetriq    DVT prophylaxis:  heparin injection 5,000 Units Start: 09/26/23 0600 SCDs Start: 09/25/23 2127  Code Status: Full code Family Communication: None at bedside Disposition Plan: Home Status is: Inpatient Remains inpatient appropriate because: SBO treatment    Antimicrobials:  Anti-infectives (From admission, onward)    Start     Dose/Rate Route Frequency Ordered Stop   09/25/23 2200  metroNIDAZOLE (FLAGYL) IVPB 500 mg  Status:  Discontinued         500 mg 100 mL/hr over 60 Minutes Intravenous Every 12 hours 09/25/23 2059 09/26/23 1142   09/25/23 2115  cefTRIAXone (ROCEPHIN) 2 g in sodium chloride 0.9 % 100 mL IVPB  Status:  Discontinued        2 g 200 mL/hr over 30 Minutes Intravenous Every 24 hours 09/25/23 2059 09/26/23 1142        Objective: Vitals:   09/27/23 0544 09/27/23 1314 09/27/23 2041 09/28/23 0548  BP: 116/65 105/61 119/66 100/71  Pulse: 86 74 97 83  Resp: 18 15 18 16   Temp: 97.9 F (36.6 C) 97.7 F (36.5 C) 99.6 F (37.6 C) 97.7 F (36.5 C)  TempSrc: Oral  Oral Oral  SpO2: 93% 97% 97% 98%  Weight:      Height:        Intake/Output Summary (Last 24 hours) at 09/28/2023 1106 Last data filed at 09/28/2023 0943 Gross per 24 hour  Intake 1589.61 ml  Output 2802 ml  Net -1212.39 ml   Filed Weights   09/25/23 1516  Weight: 72.6 kg    Examination:  General exam: Appears calm and comfortable  Respiratory system: Clear to auscultation. Respiratory effort normal. No respiratory distress. No conversational dyspnea.  Cardiovascular system: S1 & S2 heard, RRR. No murmurs. No pedal edema. Gastrointestinal system: Abdomen is nondistended, soft and not TTP, faint bowel sounds heard  Central nervous system: Alert and oriented. No focal neurological deficits. Speech clear.  Extremities: Symmetric in appearance  Skin: No rashes, lesions or ulcers on exposed skin  Psychiatry: Judgement and insight appear normal. Mood &  affect appropriate.   Data Reviewed: I have personally reviewed following labs and imaging studies  CBC: Recent Labs  Lab 09/25/23 1530 09/25/23 2140 09/26/23 0444  WBC 16.0* 10.3 6.9  NEUTROABS 13.9*  --   --   HGB 15.4 15.6 15.2  HCT 44.8 45.0 44.0  MCV 86.3 86.7 87.1  PLT 307 306 271   Basic Metabolic Panel: Recent Labs  Lab 09/25/23 1711 09/25/23 2140 09/26/23 0444 09/27/23 0425 09/28/23 0515  NA 127* 127* 128* 132* 134*  K 5.0 4.6 4.6 2.9* 3.0*  CL 95* 96* 95* 86* 92*   CO2 18* 19* 22 34* 29  GLUCOSE 188* 132* 191* 158* 87  BUN 48* 47* 44* 25* 22  CREATININE 4.19* 3.69* 3.13* 1.39* 1.43*  CALCIUM 9.1 8.6* 8.6* 8.2* 8.3*  MG  --  2.0  --   --  2.0   GFR: Estimated Creatinine Clearance: 40.3 mL/min (A) (by C-G formula based on SCr of 1.43 mg/dL (H)). Liver Function Tests: Recent Labs  Lab 09/25/23 1711  AST 17  ALT 10  ALKPHOS 105  BILITOT 0.9  PROT 6.9  ALBUMIN 3.7   No results for input(s): "LIPASE", "AMYLASE" in the last 168 hours. No results for input(s): "AMMONIA" in the last 168 hours. Coagulation Profile: Recent Labs  Lab 09/26/23 0444  INR 1.2   Cardiac Enzymes: No results for input(s): "CKTOTAL", "CKMB", "CKMBINDEX", "TROPONINI" in the last 168 hours. BNP (last 3 results) No results for input(s): "PROBNP" in the last 8760 hours. HbA1C: No results for input(s): "HGBA1C" in the last 72 hours. CBG: No results for input(s): "GLUCAP" in the last 168 hours. Lipid Profile: No results for input(s): "CHOL", "HDL", "LDLCALC", "TRIG", "CHOLHDL", "LDLDIRECT" in the last 72 hours. Thyroid Function Tests: No results for input(s): "TSH", "T4TOTAL", "FREET4", "T3FREE", "THYROIDAB" in the last 72 hours. Anemia Panel: No results for input(s): "VITAMINB12", "FOLATE", "FERRITIN", "TIBC", "IRON", "RETICCTPCT" in the last 72 hours. Sepsis Labs: Recent Labs  Lab 09/25/23 2202  LATICACIDVEN 2.0*    Recent Results (from the past 240 hours)  Urine Culture     Status: Abnormal   Collection Time: 09/25/23  7:36 PM   Specimen: Urine, Clean Catch  Result Value Ref Range Status   Specimen Description   Final    URINE, CLEAN CATCH Performed at [facility], [address], [place], [place] [address]    Special Requests   Final    NONE Performed at [facility], [address], [place], [place] [address]    Culture (A)  Final    >=100,000 COLONIES/mL METHICILLIN RESISTANT STAPHYLOCOCCUS AUREUS   Report  Status 09/28/2023 FINAL  Final   Organism ID, Bacteria METHICILLIN RESISTANT STAPHYLOCOCCUS AUREUS (A)  Final      Susceptibility   Methicillin resistant staphylococcus aureus - MIC*    CIPROFLOXACIN >=8 RESISTANT Resistant     GENTAMICIN <=0.5 SENSITIVE Sensitive     NITROFURANTOIN <=16 SENSITIVE Sensitive     OXACILLIN >=4 RESISTANT Resistant     TETRACYCLINE <=1 SENSITIVE Sensitive     VANCOMYCIN <=0.5 SENSITIVE Sensitive     TRIMETH/SULFA <=10 SENSITIVE Sensitive     RIFAMPIN <=0.5 SENSITIVE Sensitive     Inducible Clindamycin NEGATIVE Sensitive     LINEZOLID 2 SENSITIVE Sensitive     * >=100,000 COLONIES/mL METHICILLIN RESISTANT STAPHYLOCOCCUS AUREUS  Culture, blood (Routine X 2) w Reflex to ID Panel     Status: None (Preliminary result)   Collection Time: 09/25/23  9:30 PM   Specimen: BLOOD RIGHT FOREARM  Result Value Ref Range Status   Specimen Description   Final    BLOOD RIGHT FOREARM Performed at [facility], [address], [place], [place] [address]    Special Requests   Final    BOTTLES DRAWN AEROBIC AND ANAEROBIC Blood Culture results may not be optimal due to an inadequate volume of blood received in culture bottles Performed at [facility], [address] [place], [place] [address]    Culture   Final    NO GROWTH 3 DAYS Performed at [facility], [address], [place], [place] [address]    Report Status PENDING  Incomplete  Culture, blood (Routine X 2) w Reflex to ID Panel     Status: None (Preliminary result)   Collection Time: 09/25/23  9:40 PM   Specimen: BLOOD  Result Value Ref Range Status   Specimen Description   Final    BLOOD RIGHT ANTECUBITAL Performed at [facility], [address], [place], [place] [address]    Special Requests   Final    BOTTLES DRAWN AEROBIC AND ANAEROBIC Blood Culture adequate volume Performed at [facility], [address],  [place], [place] [address]    Culture   Final    NO GROWTH 3 DAYS Performed at [facility], [address], [place], [place] [address]    Report Status PENDING  Incomplete      Radiology Studies: DG Abd Portable 1V Result Date: 09/27/2023 CLINICAL DATA:  Small bowel obstruction. EXAM: PORTABLE ABDOMEN - 1 VIEW COMPARISON:  09/26/2023 FINDINGS: Nasogastric tube tip is in the gastric fundus region. Persistent dilated loops of small bowel in the mid abdomen that have minimally changed. Again noted is a large amount of stool in the right colon. Patient has a right hip arthroplasty. Aortic atherosclerotic calcifications. IMPRESSION: 1. Persistent dilated loops of small bowel in the mid abdomen, minimal change from the exam on 09/26/2023. Findings are compatible with small bowel obstruction. 2. Large amount of stool in the right colon. Electronically Signed   By: Richarda Overlie M.D.   On: 09/27/2023 09:09      Scheduled Meds:  bisacodyl  10 mg Rectal Daily   heparin  5,000 Units Subcutaneous Q8H   pantoprazole (PROTONIX) IV  40 mg Intravenous QHS   Continuous Infusions:  dextrose 5 % and 0.45 % NaCl with KCl 40 mEq/L     lactated ringers     lactated ringers       LOS: 3 days   Time spent: 25 minutes   Noralee Stain, DO [facility] 09/28/2023, 11:06 AM   Available via Epic secure chat 7am-7pm After these hours, please refer to coverage provider listed on [email or web address]

## 2023-09-28 NOTE — Plan of Care (Signed)
?  Problem: Coping: ?Goal: Level of anxiety will decrease ?Outcome: Progressing ?  ?Problem: Safety: ?Goal: Ability to remain free from injury will improve ?Outcome: Progressing ?  ?

## 2023-09-29 ENCOUNTER — Inpatient Hospital Stay (HOSPITAL_COMMUNITY): Payer: Medicare Other

## 2023-09-29 ENCOUNTER — Inpatient Hospital Stay (HOSPITAL_COMMUNITY): Payer: Medicare Other | Admitting: Anesthesiology

## 2023-09-29 ENCOUNTER — Encounter (HOSPITAL_COMMUNITY)
Admission: EM | Disposition: A | Discharge status: Skilled Nursing Facility | Payer: Self-pay | Source: Home / Self Care | Attending: Internal Medicine

## 2023-09-29 ENCOUNTER — Other Ambulatory Visit: Payer: Self-pay

## 2023-09-29 DIAGNOSIS — K5669 Other partial intestinal obstruction: Secondary | ICD-10-CM | POA: Diagnosis not present

## 2023-09-29 DIAGNOSIS — K389 Disease of appendix, unspecified: Secondary | ICD-10-CM | POA: Diagnosis not present

## 2023-09-29 DIAGNOSIS — I129 Hypertensive chronic kidney disease with stage 1 through stage 4 chronic kidney disease, or unspecified chronic kidney disease: Secondary | ICD-10-CM | POA: Diagnosis not present

## 2023-09-29 DIAGNOSIS — Z22322 Carrier or suspected carrier of Methicillin resistant Staphylococcus aureus: Secondary | ICD-10-CM

## 2023-09-29 DIAGNOSIS — K56609 Unspecified intestinal obstruction, unspecified as to partial versus complete obstruction: Secondary | ICD-10-CM

## 2023-09-29 DIAGNOSIS — K388 Other specified diseases of appendix: Secondary | ICD-10-CM | POA: Diagnosis not present

## 2023-09-29 DIAGNOSIS — K565 Intestinal adhesions [bands], unspecified as to partial versus complete obstruction: Secondary | ICD-10-CM | POA: Diagnosis not present

## 2023-09-29 DIAGNOSIS — N1831 Chronic kidney disease, stage 3a: Secondary | ICD-10-CM | POA: Diagnosis not present

## 2023-09-29 DIAGNOSIS — Z4682 Encounter for fitting and adjustment of non-vascular catheter: Secondary | ICD-10-CM | POA: Diagnosis not present

## 2023-09-29 HISTORY — PX: LAPAROSCOPY: SHX197

## 2023-09-29 LAB — BASIC METABOLIC PANEL
Anion gap: 10 (ref 5–15)
BUN: 20 mg/dL (ref 8–23)
CO2: 27 mmol/L (ref 22–32)
Calcium: 8.5 mg/dL — ABNORMAL LOW (ref 8.9–10.3)
Chloride: 97 mmol/L — ABNORMAL LOW (ref 98–111)
Creatinine, Ser: 1.3 mg/dL — ABNORMAL HIGH (ref 0.61–1.24)
GFR, Estimated: 57 mL/min — ABNORMAL LOW (ref >60)
Glucose, Bld: 138 mg/dL — ABNORMAL HIGH (ref 70–99)
Potassium: 3.3 mmol/L — ABNORMAL LOW (ref 3.5–5.1)
Sodium: 134 mmol/L — ABNORMAL LOW (ref 135–145)

## 2023-09-29 LAB — CBC
HCT: 39.7 % (ref 39.0–52.0)
Hemoglobin: 13.5 g/dL (ref 13.0–17.0)
MCH: 30.4 pg (ref 26.0–34.0)
MCHC: 34 g/dL (ref 30.0–36.0)
MCV: 89.4 fL (ref 80.0–100.0)
Platelets: 269 10*3/uL (ref 150–400)
RBC: 4.44 MIL/uL (ref 4.22–5.81)
RDW: 14.5 % (ref 11.5–15.5)
WBC: 6.6 10*3/uL (ref 4.0–10.5)
nRBC: 0 % (ref 0.0–0.2)

## 2023-09-29 LAB — TYPE AND SCREEN
ABO/RH(D): O POS
Antibody Screen: NEGATIVE

## 2023-09-29 LAB — MAGNESIUM: Magnesium: 1.9 mg/dL (ref 1.7–2.4)

## 2023-09-29 LAB — SURGICAL PCR SCREEN
MRSA, PCR: POSITIVE — AB
Staphylococcus aureus: POSITIVE — AB

## 2023-09-29 LAB — GLUCOSE, CAPILLARY: Glucose-Capillary: 150 mg/dL — ABNORMAL HIGH (ref 70–99)

## 2023-09-29 SURGERY — LAPAROSCOPY, DIAGNOSTIC
Anesthesia: General | Site: Abdomen

## 2023-09-29 MED ORDER — LACTATED RINGERS IV SOLN: INTRAVENOUS | Status: DC | PRN

## 2023-09-29 MED ORDER — SODIUM CHLORIDE 0.9 % IV SOLN
2.0000 g | Freq: Two times a day (BID) | INTRAVENOUS | Status: AC
  Administered 2023-09-29: 2 g via INTRAVENOUS
  Filled 2023-09-29: qty 2

## 2023-09-29 MED ORDER — ONDANSETRON HCL 4 MG/2ML IJ SOLN
4.0000 mg | Freq: Once | INTRAMUSCULAR | Status: DC | PRN
Start: 2023-09-29 — End: 2023-09-29

## 2023-09-29 MED ORDER — KETAMINE HCL 10 MG/ML IJ SOLN
INTRAMUSCULAR | Status: DC | PRN
  Administered 2023-09-29: 20 mg via INTRAVENOUS

## 2023-09-29 MED ORDER — FENTANYL CITRATE (PF) 100 MCG/2ML IJ SOLN
INTRAMUSCULAR | Status: DC | PRN
  Administered 2023-09-29 (×3): 50 ug via INTRAVENOUS

## 2023-09-29 MED ORDER — SODIUM CHLORIDE (PF) 0.9 % IJ SOLN
INTRAMUSCULAR | Status: AC
  Filled 2023-09-29: qty 20

## 2023-09-29 MED ORDER — LIDOCAINE HCL (CARDIAC) PF 100 MG/5ML IV SOSY
PREFILLED_SYRINGE | INTRAVENOUS | Status: DC | PRN
  Administered 2023-09-29: 60 mg via INTRAVENOUS

## 2023-09-29 MED ORDER — BUPIVACAINE LIPOSOME 1.3 % IJ SUSP
INTRAMUSCULAR | Status: AC
  Filled 2023-09-29: qty 20

## 2023-09-29 MED ORDER — HYDROMORPHONE HCL 1 MG/ML IJ SOLN: 0.2500 mg | INTRAMUSCULAR | Status: DC | PRN

## 2023-09-29 MED ORDER — SODIUM CHLORIDE 0.9 % IV SOLN
INTRAVENOUS | Status: AC
  Filled 2023-09-29: qty 2

## 2023-09-29 MED ORDER — BUPIVACAINE LIPOSOME 1.3 % IJ SUSP
INTRAMUSCULAR | Status: DC | PRN
  Administered 2023-09-29: 20 mL

## 2023-09-29 MED ORDER — ROCURONIUM BROMIDE 10 MG/ML (PF) SYRINGE
PREFILLED_SYRINGE | INTRAVENOUS | Status: AC
  Filled 2023-09-29: qty 10

## 2023-09-29 MED ORDER — PROPOFOL 10 MG/ML IV BOLUS
INTRAVENOUS | Status: DC | PRN
  Administered 2023-09-29: 130 mg via INTRAVENOUS

## 2023-09-29 MED ORDER — SUCCINYLCHOLINE CHLORIDE 200 MG/10ML IV SOSY
PREFILLED_SYRINGE | INTRAVENOUS | Status: AC
  Filled 2023-09-29: qty 10

## 2023-09-29 MED ORDER — PANTOPRAZOLE SODIUM 40 MG IV SOLR
40.0000 mg | Freq: Two times a day (BID) | INTRAVENOUS | Status: DC
  Administered 2023-09-29 – 2023-10-02 (×8): 40 mg via INTRAVENOUS
  Filled 2023-09-29 (×8): qty 10

## 2023-09-29 MED ORDER — DEXAMETHASONE SODIUM PHOSPHATE 10 MG/ML IJ SOLN
INTRAMUSCULAR | Status: DC | PRN
  Administered 2023-09-29: 10 mg via INTRAVENOUS

## 2023-09-29 MED ORDER — FENTANYL CITRATE (PF) 250 MCG/5ML IJ SOLN
INTRAMUSCULAR | Status: AC
  Filled 2023-09-29: qty 5

## 2023-09-29 MED ORDER — BUPIVACAINE-EPINEPHRINE 0.25% -1:200000 IJ SOLN
INTRAMUSCULAR | Status: DC | PRN
  Administered 2023-09-29: 30 mL

## 2023-09-29 MED ORDER — PHENYLEPHRINE 80 MCG/ML (10ML) SYRINGE FOR IV PUSH (FOR BLOOD PRESSURE SUPPORT)
PREFILLED_SYRINGE | INTRAVENOUS | Status: DC | PRN
  Administered 2023-09-29: 240 ug via INTRAVENOUS

## 2023-09-29 MED ORDER — ROCURONIUM BROMIDE 100 MG/10ML IV SOLN
INTRAVENOUS | Status: DC | PRN
  Administered 2023-09-29: 50 mg via INTRAVENOUS

## 2023-09-29 MED ORDER — PHENYLEPHRINE HCL-NACL 20-0.9 MG/250ML-% IV SOLN
INTRAVENOUS | Status: DC | PRN
  Administered 2023-09-29: 50 ug/min via INTRAVENOUS

## 2023-09-29 MED ORDER — LIDOCAINE HCL (PF) 2 % IJ SOLN
INTRAMUSCULAR | Status: AC
  Filled 2023-09-29: qty 5

## 2023-09-29 MED ORDER — SUGAMMADEX SODIUM 200 MG/2ML IV SOLN
INTRAVENOUS | Status: DC | PRN
  Administered 2023-09-29: 200 mg via INTRAVENOUS

## 2023-09-29 MED ORDER — SODIUM CHLORIDE 0.9 % IV SOLN
2.0000 g | INTRAVENOUS | Status: AC
  Administered 2023-09-29: 2 g via INTRAVENOUS
  Filled 2023-09-29: qty 2

## 2023-09-29 MED ORDER — DEXAMETHASONE SODIUM PHOSPHATE 10 MG/ML IJ SOLN
INTRAMUSCULAR | Status: AC
Start: 2023-09-29 — End: ?
  Filled 2023-09-29: qty 1

## 2023-09-29 MED ORDER — CHLORHEXIDINE GLUCONATE CLOTH 2 % EX PADS
6.0000 | MEDICATED_PAD | Freq: Every day | CUTANEOUS | Status: AC
  Administered 2023-09-30 – 2023-10-04 (×5): 6 via TOPICAL

## 2023-09-29 MED ORDER — MUPIROCIN 2 % EX OINT
1.0000 | TOPICAL_OINTMENT | Freq: Two times a day (BID) | CUTANEOUS | Status: AC
  Administered 2023-09-29 – 2023-10-04 (×10): 1 via NASAL
  Filled 2023-09-29: qty 22

## 2023-09-29 MED ORDER — PROPOFOL 10 MG/ML IV BOLUS
INTRAVENOUS | Status: AC
  Filled 2023-09-29: qty 20

## 2023-09-29 MED ORDER — ONDANSETRON HCL 4 MG/2ML IJ SOLN
INTRAMUSCULAR | Status: AC
  Filled 2023-09-29: qty 2

## 2023-09-29 MED ORDER — VANCOMYCIN HCL IN DEXTROSE 1-5 GM/200ML-% IV SOLN
1000.0000 mg | INTRAVENOUS | Status: AC
  Administered 2023-09-29: 1000 mg via INTRAVENOUS
  Filled 2023-09-29: qty 200

## 2023-09-29 MED ORDER — ARTIFICIAL TEARS OPHTHALMIC OINT
TOPICAL_OINTMENT | OPHTHALMIC | Status: AC
  Filled 2023-09-29: qty 3.5

## 2023-09-29 MED ORDER — AMISULPRIDE (ANTIEMETIC) 5 MG/2ML IV SOLN: 10.0000 mg | Freq: Once | INTRAVENOUS | Status: DC | PRN

## 2023-09-29 MED ORDER — ONDANSETRON HCL 4 MG/2ML IJ SOLN
INTRAMUSCULAR | Status: DC | PRN
  Administered 2023-09-29: 4 mg via INTRAVENOUS

## 2023-09-29 MED ORDER — KETAMINE HCL 50 MG/5ML IJ SOSY
PREFILLED_SYRINGE | INTRAMUSCULAR | Status: AC
  Filled 2023-09-29: qty 5

## 2023-09-29 MED ORDER — ACETAMINOPHEN 10 MG/ML IV SOLN
INTRAVENOUS | Status: AC
  Filled 2023-09-29: qty 100

## 2023-09-29 MED ORDER — POTASSIUM CHLORIDE 10 MEQ/100ML IV SOLN
10.0000 meq | INTRAVENOUS | Status: AC
  Filled 2023-09-29: qty 100

## 2023-09-29 MED ORDER — KCL IN DEXTROSE-NACL 40-5-0.45 MEQ/L-%-% IV SOLN
INTRAVENOUS | Status: DC
  Filled 2023-09-29 (×2): qty 1000

## 2023-09-29 MED ORDER — BUPIVACAINE-EPINEPHRINE 0.25% -1:200000 IJ SOLN
INTRAMUSCULAR | Status: AC
  Filled 2023-09-29: qty 1

## 2023-09-29 MED ORDER — SUCCINYLCHOLINE CHLORIDE 200 MG/10ML IV SOSY
PREFILLED_SYRINGE | INTRAVENOUS | Status: DC | PRN
  Administered 2023-09-29: 140 mg via INTRAVENOUS

## 2023-09-29 MED ORDER — ACETAMINOPHEN 10 MG/ML IV SOLN
INTRAVENOUS | Status: DC | PRN
  Administered 2023-09-29: 1000 mg via INTRAVENOUS

## 2023-09-29 MED ORDER — CHLORHEXIDINE GLUCONATE CLOTH 2 % EX PADS
6.0000 | MEDICATED_PAD | Freq: Once | CUTANEOUS | Status: AC
  Administered 2023-09-29: 6 via TOPICAL

## 2023-09-29 MED ORDER — VANCOMYCIN HCL IN DEXTROSE 1-5 GM/200ML-% IV SOLN
INTRAVENOUS | Status: AC
  Filled 2023-09-29: qty 200

## 2023-09-29 SURGICAL SUPPLY — 47 items
BAG COUNTER SPONGE SURGICOUNT (BAG) IMPLANT
CABLE HIGH FREQUENCY MONO STRZ (ELECTRODE) ×1 IMPLANT
COVER SURGICAL LIGHT HANDLE (MISCELLANEOUS) ×1 IMPLANT
DEVICE TROCAR PUNCTURE CLOSURE (ENDOMECHANICALS) IMPLANT
DRAPE UTILITY XL STRL (DRAPES) ×1 IMPLANT
DRAPE WARM FLUID 44X44 (DRAPES) ×1 IMPLANT
DRSG TEGADERM 2-3/8X2-3/4 SM (GAUZE/BANDAGES/DRESSINGS) IMPLANT
DRSG TEGADERM 4X4.75 (GAUZE/BANDAGES/DRESSINGS) IMPLANT
ELECT REM PT RETURN 15FT ADLT (MISCELLANEOUS) ×1 IMPLANT
GAUZE SPONGE 2X2 8PLY STRL LF (GAUZE/BANDAGES/DRESSINGS) ×1 IMPLANT
GAUZE SPONGE 4X4 12PLY STRL (GAUZE/BANDAGES/DRESSINGS) IMPLANT
GLOVE ECLIPSE 8.0 STRL XLNG CF (GLOVE) ×1 IMPLANT
GLOVE INDICATOR 8.0 STRL GRN (GLOVE) ×1 IMPLANT
GOWN STRL REUS W/ TWL XL LVL3 (GOWN DISPOSABLE) ×4 IMPLANT
IRRIG SUCT STRYKERFLOW 2 WTIP (MISCELLANEOUS) ×1
IRRIGATION SUCT STRKRFLW 2 WTP (MISCELLANEOUS) ×1 IMPLANT
KIT BASIN OR (CUSTOM PROCEDURE TRAY) ×1 IMPLANT
KIT TURNOVER KIT A (KITS) IMPLANT
NDL INSUFFLATION 14GA 120MM (NEEDLE) IMPLANT
NEEDLE INSUFFLATION 14GA 120MM (NEEDLE) ×1 IMPLANT
PAD POSITIONING PINK XL (MISCELLANEOUS) ×1 IMPLANT
POUCH RETRIEVAL ECOSAC 10 (ENDOMECHANICALS) IMPLANT
PROTECTOR NERVE ULNAR (MISCELLANEOUS) IMPLANT
RELOAD STAPLE 60 3.6 BLU REG (STAPLE) IMPLANT
RELOAD STAPLER BLUE 60MM (STAPLE) ×1 IMPLANT
RETRACTOR WND ALEXIS 25 LRG (MISCELLANEOUS) IMPLANT
RTRCTR WOUND ALEXIS 25CM LRG (MISCELLANEOUS)
SCISSORS LAP 5X35 DISP (ENDOMECHANICALS) ×1 IMPLANT
SEALER TISSUE G2 STRG ARTC 35C (ENDOMECHANICALS) IMPLANT
SET TUBE SMOKE EVAC HIGH FLOW (TUBING) ×1 IMPLANT
SLEEVE ADV FIXATION 5X100MM (TROCAR) ×2 IMPLANT
SLEEVE Z-THREAD 5X100MM (TROCAR) IMPLANT
SPIKE FLUID TRANSFER (MISCELLANEOUS) ×1 IMPLANT
STAPLE ECHEON FLEX 60 POW ENDO (STAPLE) IMPLANT
STAPLER RELOAD BLUE 60MM (STAPLE) ×1
SUT MNCRL AB 4-0 PS2 18 (SUTURE) ×1 IMPLANT
SUT PDS AB 1 CTXB1 36 (SUTURE) IMPLANT
SUT SILK 2 0 SH CR/8 (SUTURE) ×1 IMPLANT
SUT SILK 2-0 18XBRD TIE 12 (SUTURE) ×1 IMPLANT
SUT VLOC 180 2-0 6IN GS21 (SUTURE) ×2 IMPLANT
TOWEL OR 17X26 10 PK STRL BLUE (TOWEL DISPOSABLE) ×1 IMPLANT
TRAY FOLEY MTR SLVR 16FR STAT (SET/KITS/TRAYS/PACK) IMPLANT
TRAY LAPAROSCOPIC (CUSTOM PROCEDURE TRAY) ×1 IMPLANT
TROCAR 11X100 Z THREAD (TROCAR) IMPLANT
TROCAR ADV FIXATION 5X100MM (TROCAR) ×1 IMPLANT
TROCAR Z THREAD OPTICAL 12X100 (TROCAR) IMPLANT
TROCAR Z-THREAD OPTICAL 5X100M (TROCAR) ×1 IMPLANT

## 2023-09-29 NOTE — Op Note (Addendum)
PATIENT:  Antonio Serrano  77 y.o. male  Patient Care Team: Jeoffrey Massed, MD as PCP - General (Family Medicine) Leilani Able (Dentistry) Fredrich Birks, OD as Consulting Physician (Optometry) Annie Sable, MD as Consulting Physician (Nephrology) [facility], Rachelle Hora, MD as Consulting Physician (Cardiology) Ricky Stabs, RN as [facility] Care Management (General Practice)  PRE-OPERATIVE DIAGNOSIS:  SMALL BOWEL OBSTRUCTION  POST-OPERATIVE DIAGNOSIS:   CLOSED LOOP BOWEL OBSTRUCTION DUE TO ADHESION TO ABNORMAL APPENDIX  PROCEDURE:  DIAGNOSTIC LAPAROSCOPY WITH LYSIS OF ADHESIONS LAPAROSCOPIC APPENDECTOMY TRANSVERSUS ABDOMINIS PLANE (TAP) BLOCK - BILATERAL  SURGEON:  Ardeth Sportsman, MD  ASSISTANT:  (n/a)   ANESTHESIA:  General endotracheal intubation anesthesia (GETA) and Regional TRANSVERSUS ABDOMINIS PLANE (TAP) nerve block -BILATERAL for perioperative & postoperative pain control at the level of the transverse abdominis & preperitoneal spaces along the flank at the anterior axillary line, from subcostal ridge to iliac crest under laparoscopic guidance provided with liposomal bupivacaine (Experel) 20mL mixed with 60 mL of bupivicaine 0.25% with epinephrine  Estimated Blood Loss (EBL):   Total I/O In: 200 [IV Piggyback:200] Out: 505 [Urine:285; Other:200; Blood:20].   (See anesthesia record)  Delay start of Pharmacological VTE agent (>24hrs) due to concerns of significant anemia, surgical blood loss, or risk of bleeding?:  no  DRAINS: (None)  SPECIMEN:  Appendix  DISPOSITION OF SPECIMEN:  Pathology  COUNTS:  Sponge, needle, & instrument counts CORRECT  PLAN OF CARE: Admit to inpatient   PATIENT DISPOSITION:  PACU - hemodynamically stable.   INDICATIONS: Patient with concerning symptoms of small bowel obstruction.  No prior abdominal surgery.  Underwent small bowel protocol with nasogastric decompression.  Patiently had some flatus with  decompression but then got distended again and no improvement on films after 72 hours.  Recommendation made for laparoscopic possible open exploration.  Possible need for bowel resection  The anatomy & physiology of the digestive tract was discussed.  The pathophysiology of intestinal obstruction was discussed.  Natural history risks without surgery was discussed.   I feel the patient has failed non-operative therapies.  The risks of no intervention will lead to serious problems such as necrosis, perforation, dehydration, etc. that outweigh the operative risks; therefore, I recommended abdominal exploration to diagnose & treat the source of the problem.  Minimally Invasive & open techniques were discussed.   I expressed a good likelihood that surgery will treat the problem.  Risks such as bleeding, infection, abscess, leak, reoperation, bowel resection, possible ostomy, hernia, injury to other organs, need for repair of tissues / organs, need for further treatment, heart attack, death, and other risks were discussed.   I noted a good likelihood this will help address the problem.  Goals of post-operative recovery were discussed as well.  We will work to minimize complications. Questions were answered.  The patient expresses understanding & wishes to proceed with surgery.   OR FINDINGS: Closed-loop obstruction of mid jejunum due to adhesion from sigmoid colon epiploic appendage to tip of appendix and right lower quadrant.  Significant ischemia but markedly improved after decompression.  No necrosis or perforation or stricture.  Therefore no small bowel resection needed.  Very long appendix with some twisting and abnormal thickening at the tip of adhesion.  No obvious mucin or mass/tumor.  Mildly floppy high riding cecum and shortened ascending colon.  Not classic for appendicitis but appendectomy done.  CASE DATA:  Type of patient?: LDOW CASE (Surgical Hospitalist [facility] Inpatient)  Status of Case? URGENT  Add On  Infection Present At Time Of Surgery (PATOS)?  NO  DESCRIPTION:   Informed consent was confirmed.  The patient underwent general anaesthesia without difficulty.  The patient was positioned appropriately.  VTE prevention in place.  The patient's abdomen was clipped, prepped, & draped in a sterile fashion.  Surgical timeout confirmed our plan.  Peritoneal entry with a laparoscopic port was obtained using Varess spring needle entry technique in the left upper abdomen as the patient was positioned in reverse Trendelenburg.  I induced carbon dioxide insufflation.  No change in end tidal CO2 measurements.  Full symmetrical abdominal distention.  Initial port was carefully placed.  Camera inspection revealed no injury.  Extra ports were carefully placed under direct laparoscopic visualization.  Small bowel was rather dilated.  I could see some red-purple congested bowel with some ischemia in the lower abdomen.  I isolated the ileocecal region and ran the decompressed terminal ileum more proximally.  Mostly ileum was decompressed but in this to mid small bowel it became sweep underneath and adhesion of the sigmoid colon to the right side.  He could not be decompressed.  Was able to find bowel proximal to this trapped area that was dilated in the mid to proximal jejunum.  Saw no other abnormalities.  I was able to eventually isolate the mid sigmoid colon epiploic appendage adhesion going to the right retroperitoneum.  After isolating conferment transected it with bipolar Enseal vessel sealer given the its thickness.  That immediately released the closed-loop obstruction.  There was some ecchymosis of the mesentery but the bowel perked up.  I did irrigation.  We ran the small bowel from the ileocecal valve to ligament of Treitz.  There was some pinching of kinking at the closed-loop areas in the distal jejunum and proximal ileum but they dilated up and perked up.  Inspection noted that he had a very long  appendix somewhat twisted and this was where the adhesion of epiploic appendage was to it.  Mostly appendix seems soft but the distal couple centimeters were thickened.  Hard to know if this is secondary inflammation or not.  We waited an additional 15 minutes and the small bowel that had been trapped in the closed-loop looks much better and more healthy.  Some ecchymosis but no ischemia or necrosis or perforation.  I held off on bowel resection.  However given the abnormal appendix with a thickened tipped I decided to go and proceed with appendectomy.  The cecum was somewhat mobile but tethered due to the appendix adhesions to the right posterior retroperitoneum.  Freed the appendix off the retroperitoneum and elevated it.  Transected through the mesoappendix until I came to its takeoff from the cecal wall.  I was able to free off the base of the appendix which was still viable.  I stapled the appendix off the cecum using a laparoscopic stapler. I took a healthy cuff of viable cecum. I ligated the mesoappendix and assured hemostasis in the mesentery.  I placed the appendix inside an EcoSac bag and removed out the 12mm stapler port.  I did copious irrigation. Hemostasis was good in the mesoappendix, colon mesentery, and retroperitoneum. Staple line was intact on the cecum with no bleeding. I washed out the pelvis, retrohepatic space and right paracolic gutter. I washed out the left side as well.  Hemostasis is good.  Ran the small bowel 1 more time and confirmed that the bowel was much healthier.  The prior decompressed bowel was dilating up easily.  There  was no significant kinking and think closed-loop obstruction points and it dilated well argue against any permanent stricture.  The ischemic closed-loop section of the distal jejunum had perked up and looked much more normal and just slightly ecchymotic.  Again held off on bowel resection.  There was no perforation or injury.   Because the area cleaned up well  after irrigation, I did not place a drain.  I closed the 12 mm stapler port site fascia with #1 PDS suture using a suture passer under direct laparoscopic visualization.   I aspirated the carbon dioxide. Ports removed.  I closed skin using 4-0 monocryl stitch.  Sterile dressings applied.  Patient was extubated and sent to the recovery room.  PACU nurse at bedside.  Comfortable.  No hemodynamic instability, so should be able to go back to the floor.  NG tube until return of bowel function and then NG tube clamping trial.  Probably just needs antibiotics overnight from surgery standpoint.  Family wanted me to call the daughter-in-law, but I do not have her number.  Her spouse, the patient's son, I called.  No answer & VM full.  I called the floor but they are not there.  Will try and reach later.   Ardeth Sportsman, M.D., F.A.C.S. Gastrointestinal and Minimally Invasive Surgery [facility] [address] [place], [place] [address] [phone] Main / Paging  09/29/2023 11:44 AM

## 2023-09-29 NOTE — Progress Notes (Signed)
09/29/2023  Antonio Serrano [id number] 1946-03-08  CARE TEAM: PCP: Jeoffrey Massed, MD  Outpatient Care Team: Patient Care Team: Jeoffrey Massed, MD as PCP - General (Family Medicine) Leilani Able (Dentistry) Fredrich Birks, OD as Consulting Physician (Optometry) Annie Sable, MD as Consulting Physician (Nephrology) [facility] Croitoru, Rachelle Hora, MD as Consulting Physician (Cardiology) Ricky Stabs, RN as [facility] Care Management (General Practice)  Inpatient Treatment Team: Treatment Team:  Noralee Stain, DO Lilyan Gilford, MD [facility], Md, MD Fuller Canada, NT Paudel, Bishnu D, RN Diona Browner, RN Merwyn Katos, NT Deveron Furlong, RN   Problem List:   Principal Problem:   SBO (small bowel obstruction) [facility]) Active Problems:   Chronic renal insufficiency, stage III (moderate) (HCC)   Type 2 diabetes mellitus with stage 3a chronic kidney disease, without long-term current use of insulin (HCC)   Constipation, chronic   Hyponatremia   Leukocytosis   AKI (acute kidney injury) (HCC)   Hypokalemia   * No surgery found *      Assessment [facility] Stay = 4 days)      Small bowel obstruction Transition point down the pelvis.  No prior surgeries but there is no mass or tumor or suspicious.  Most likely some omental and other adhesions causing a transition point.    Plan:  Persistent obstruction.  Now seeming to go being obstipated.  Films showing no improvement.  I think he requires surgical exploration.  He is not massively distended nor in severe shock so reasonable to start out laparoscopically with low threshold for open lyse lesions.  Rule out intraluminal tumor mass that could be the source of obstruction.  Patient agrees with proceeding with surgery today since he has not gotten better and actually feels a little worse the past 24 hours  The anatomy & physiology of the digestive tract was discussed.  The pathophysiology of  intestinal obstruction was discussed.  Natural history risks without surgery was discussed.   I feel the patient has failed non-operative therapies.  The risks of no intervention will lead to serious problems such as necrosis, perforation, dehydration, etc. that outweigh the operative risks; therefore, I recommended abdominal exploration to diagnose & treat the source of the problem.  Minimally Invasive & open techniques were discussed.   I expressed a good likelihood that surgery will treat the problem.  Risks such as bleeding, infection, abscess, leak, reoperation, bowel resection, possible ostomy, hernia, injury to other organs, need for repair of tissues / organs, need for further treatment, heart attack, death, and other risks were discussed.   I noted a good likelihood this will help address the problem.  Goals of post-operative recovery were discussed as well.  We will work to minimize complications. Questions were answered.  The patient expresses understanding & wishes to proceed with surgery.  Hypokalemia getting replaced.  Will switch IV fluids to add potassium as long as primary service agrees.  Potassium proved to 3.3.  Will give an additional just in case.  History of some gastritis and GERD.  Already on IV PPI.  Will double for right now given darker NGT output.  Patient has MRSA on urine culture but primary service thinks this is perhaps just colonization.  No antibiotics yet.  Defer to primary medicine service.  Will add vancomycin IV perioperatively just in case.  MRSA screening occurring.  VTE prophylaxis- SCDs, etc  mobilize as tolerated to help recovery  -Disposition: TBD       I reviewed nursing  notes, ED provider notes, hospitalist notes, last 24 h vitals and pain scores, last 48 h intake and output, last 24 h labs and trends, and last 24 h imaging results.  I have reviewed this patient's available data, including medical history, events of note, test results, etc as part  of my evaluation.   A significant portion of that time was spent in counseling. Care during the described time interval was provided by me.  This care required moderate level of medical decision making.  09/29/2023    Subjective: (Chief complaint)  Patient complaining more of a sore throat.  Abdomen little more crampy.  No major events.  Has not had a bowel movement.  Wishes to reconsider surgery  Objective:  Vital signs:  Vitals:   09/28/23 0548 09/28/23 1259 09/28/23 2152 09/29/23 0640  BP: 100/71 139/84 122/77 119/81  Pulse: 83 88 93 86  Resp: 16  19 16   Temp: 97.7 F (36.5 C) 100.2 F (37.9 C) 97.7 F (36.5 C) 98.2 F (36.8 C)  TempSrc: Oral Oral Oral Oral  SpO2: 98% 100% 97% 96%  Weight:      Height:        Last BM Date :  (pt said he had BM today)  Intake/Output   Yesterday:  12/28 0701 - 12/29 0700 In: 1950 [P.O.:150; I.V.:1800] Out: 1570 [Urine:220; Emesis/NG output:1350] This shift:  No intake/output data recorded.  Bowel function:  Flatus: YES  BM:  No  Drain: Nasogastric tube with thin dark green effluent in canister.   Physical Exam:  General: Pt awake/alert in no acute distress Eyes: PERRL, normal EOM.  Sclera clear.  No icterus Neuro: CN II-XII intact w/o focal sensory/motor deficits. Lymph: No head/neck/groin lymphadenopathy Psych:  No delerium/psychosis/paranoia.  Oriented x 3 HENT: Normocephalic, Mucus membranes moist.  No thrush Neck: Supple, No tracheal deviation.  No obvious thyromegaly Chest: No pain to chest wall compression.  Good respiratory excursion.  No audible wheezing CV:  Pulses intact.  Regular rhythm.  No major extremity edema MS: Normal AROM mjr joints.  No obvious deformity  Abdomen: Soft.  Nondistended.  Mild abdominal discomfort.  No guarding.  No evidence of peritonitis.  No incarcerated hernias.  Ext:   No deformity.  No mjr edema.  No cyanosis Skin: No petechiae / purpurea.  No major sores.  Warm and  dry    Results:   Cultures: Recent Results (from the past 720 hours)  Urine Culture     Status: Abnormal   Collection Time: 09/25/23  7:36 PM   Specimen: Urine, Clean Catch  Result Value Ref Range Status   Specimen Description   Final    URINE, CLEAN CATCH Performed at [facility], [address] [place], [place] [address]    Special Requests   Final    NONE Performed at [facility], [address] [place], [place] [address]    Culture (A)  Final    >=100,000 COLONIES/mL METHICILLIN RESISTANT STAPHYLOCOCCUS AUREUS   Report Status 09/28/2023 FINAL  Final   Organism ID, Bacteria METHICILLIN RESISTANT STAPHYLOCOCCUS AUREUS (A)  Final      Susceptibility   Methicillin resistant staphylococcus aureus - MIC*    CIPROFLOXACIN >=8 RESISTANT Resistant     GENTAMICIN <=0.5 SENSITIVE Sensitive     NITROFURANTOIN <=16 SENSITIVE Sensitive     OXACILLIN >=4 RESISTANT Resistant     TETRACYCLINE <=1 SENSITIVE Sensitive     VANCOMYCIN <=0.5 SENSITIVE Sensitive  TRIMETH/SULFA <=10 SENSITIVE Sensitive     RIFAMPIN <=0.5 SENSITIVE Sensitive     Inducible Clindamycin NEGATIVE Sensitive     LINEZOLID 2 SENSITIVE Sensitive     * >=100,000 COLONIES/mL METHICILLIN RESISTANT STAPHYLOCOCCUS AUREUS  Culture, blood (Routine X 2) w Reflex to ID Panel     Status: None (Preliminary result)   Collection Time: 09/25/23  9:30 PM   Specimen: BLOOD RIGHT FOREARM  Result Value Ref Range Status   Specimen Description   Final    BLOOD RIGHT FOREARM Performed at [facility], [address] [place], [place] [address]    Special Requests   Final    BOTTLES DRAWN AEROBIC AND ANAEROBIC Blood Culture results may not be optimal due to an inadequate volume of blood received in culture bottles Performed at [facility], [address] [place], [place] [address]    Culture   Final    NO GROWTH 3 DAYS Performed at [facility], [address], [place], [place] [address]    Report Status PENDING  Incomplete  Culture, blood (Routine X 2) w Reflex to ID Panel     Status: None (Preliminary result)   Collection Time: 09/25/23  9:40 PM   Specimen: BLOOD  Result Value Ref Range Status   Specimen Description   Final    BLOOD RIGHT ANTECUBITAL Performed at [facility], [address] [place], [place] [address]    Special Requests   Final    BOTTLES DRAWN AEROBIC AND ANAEROBIC Blood Culture adequate volume Performed at [facility], [address], [place], [place] [address]    Culture   Final    NO GROWTH 3 DAYS Performed at [facility], [address], [place], [place] [address]    Report Status PENDING  Incomplete    Labs: Results for orders placed or performed during the hospital encounter of 09/25/23 (from the past 48 hours)  Urinalysis, Routine w reflex microscopic -Urine, Clean Catch     Status: Abnormal   Collection Time: 09/27/23  2:28 PM  Result Value Ref Range   Color, Urine YELLOW YELLOW   APPearance CLEAR CLEAR   Specific Gravity, Urine 1.015 1.005 - 1.030   pH 9.0 (H) 5.0 - 8.0   Glucose, UA 50 (A) NEGATIVE mg/dL   Hgb urine dipstick NEGATIVE NEGATIVE   Bilirubin Urine NEGATIVE NEGATIVE   Ketones, ur NEGATIVE NEGATIVE mg/dL   Protein, ur 528 (A) NEGATIVE mg/dL   Nitrite NEGATIVE NEGATIVE   Leukocytes,Ua NEGATIVE NEGATIVE   RBC / HPF 0-5 0 - 5 RBC/hpf   WBC, UA 0-5 0 - 5 WBC/hpf   Bacteria, UA NONE SEEN NONE SEEN   Squamous Epithelial / HPF 0-5 0 - 5 /HPF    Comment: Performed at [facility], [address], [place], [place] [address]  Basic metabolic panel     Status: Abnormal   Collection Time: 09/28/23  5:15 AM  Result Value Ref Range   Sodium 134 (L) 135 - 145 mmol/L   Potassium 3.0 (L) 3.5 - 5.1 mmol/L   Chloride 92 (L) 98 - 111 mmol/L   CO2 29 22 - 32 mmol/L   Glucose, Bld 87 70 - 99 mg/dL    Comment:  Glucose reference range applies only to samples taken after fasting for at least 8 hours.   BUN 22 8 - 23 mg/dL   Creatinine, Ser 4.01 (H) 0.61 - 1.24  mg/dL   Calcium 8.3 (L) 8.9 - 10.3 mg/dL   GFR, Estimated 51 (L) >60 mL/min    Comment: (NOTE) Calculated using the CKD-EPI Creatinine Equation (2021)    Anion gap 13 5 - 15    Comment: Performed at [facility], [address], [place], [place] [address]  Magnesium     Status: None   Collection Time: 09/28/23  5:15 AM  Result Value Ref Range   Magnesium 2.0 1.7 - 2.4 mg/dL    Comment: Performed at [facility], [address] [place], [place] [address]  Basic metabolic panel     Status: Abnormal   Collection Time: 09/29/23  4:24 AM  Result Value Ref Range   Sodium 134 (L) 135 - 145 mmol/L   Potassium 3.3 (L) 3.5 - 5.1 mmol/L   Chloride 97 (L) 98 - 111 mmol/L   CO2 27 22 - 32 mmol/L   Glucose, Bld 138 (H) 70 - 99 mg/dL    Comment: Glucose reference range applies only to samples taken after fasting for at least 8 hours.   BUN 20 8 - 23 mg/dL   Creatinine, Ser 8.29 (H) 0.61 - 1.24 mg/dL   Calcium 8.5 (L) 8.9 - 10.3 mg/dL   GFR, Estimated 57 (L) >60 mL/min    Comment: (NOTE) Calculated using the CKD-EPI Creatinine Equation (2021)    Anion gap 10 5 - 15    Comment: Performed at [facility], [address], [place], [place] [address]  Magnesium     Status: None   Collection Time: 09/29/23  4:24 AM  Result Value Ref Range   Magnesium 1.9 1.7 - 2.4 mg/dL    Comment: Performed at [facility], [address], [place], [place] [address]    Imaging / Studies: DG Abd Portable 2V Result Date: 09/28/2023 CLINICAL DATA:  [id number] SBO (small bowel obstruction) (HCC) [id number] EXAM: PORTABLE ABDOMEN - 2 VIEW COMPARISON:  09/27/2023 FINDINGS: Enteric tube remains coiled in the stomach. Persistently dilated loops of small bowel within the mid and upper abdomen measuring  up to 5.1 cm in diameter, minimally increased compared to the prior study. No Jaylene Arrowood free intraperitoneal air. IMPRESSION: Findings of small-bowel obstruction with slightly increasing small-bowel caliber compared to the prior study. Electronically Signed   By: Duanne Guess D.O.   On: 09/28/2023 13:45    Medications / Allergies: per chart  Antibiotics: Anti-infectives (From admission, onward)    Start     Dose/Rate Route Frequency Ordered Stop   09/25/23 2200  metroNIDAZOLE (FLAGYL) IVPB 500 mg  Status:  Discontinued        500 mg 100 mL/hr over 60 Minutes Intravenous Every 12 hours 09/25/23 2059 09/26/23 1142   09/25/23 2115  cefTRIAXone (ROCEPHIN) 2 g in sodium chloride 0.9 % 100 mL IVPB  Status:  Discontinued        2 g 200 mL/hr over 30 Minutes Intravenous Every 24 hours 09/25/23 2059 09/26/23 1142         Note: Portions of this report may have been transcribed using voice recognition software. Every effort was made to ensure accuracy; however, inadvertent computerized transcription errors may be present.   Any transcriptional errors that result from this process are unintentional.    Ardeth Sportsman, MD, FACS, MASCRS Esophageal, Gastrointestinal & Colorectal Surgery Robotic and Minimally Invasive Surgery  [facility] [address] [place], [place] [address] (  [phone] Fax [phone] Main  CONTACT INFORMATION: Weekday (9AM-5PM): Call [facility] main office at [phone] Weeknight (5PM-9AM) or Weekend/Holiday: Check EPIC "Web Links" tab & use "AMION" (password " [email or web address]") for General Surgery [facility] coverage  Please, DO NOT use SecureChat  (it is not reliable communication to reach operating surgeons & will lead to a delay in care).   Epic staff messaging available for outptient concerns needing 1-2 business day response.      09/29/2023  7:41 AM

## 2023-09-29 NOTE — Anesthesia Preprocedure Evaluation (Addendum)
Anesthesia Evaluation  Patient identified by MRN, date of birth, ID band Patient awake    Reviewed: Allergy & Precautions, NPO status , Patient's Chart, lab work & pertinent test results  Airway Mallampati: III  TM Distance: >3 FB Neck ROM: Full    Dental  (+) Edentulous Upper, Missing, Dental Advisory Given, Poor Dentition   Pulmonary Current Smoker and Patient abstained from smoking. 3-4 cigg/d No inhalers   Pulmonary exam normal breath sounds clear to auscultation       Cardiovascular hypertension (92/73 preop), + dysrhythmias (EKG on 12/25 admissionin bigeminy. rhythm preop NSR with singificant ectopy/PACs)  Rhythm:Irregular Rate:Normal  Echo 12/2021  1. Left ventricular ejection fraction, by estimation, is 60 to 65%. The  left ventricle has normal function. The left ventricle has no regional  wall motion abnormalities. There is mild left ventricular hypertrophy.  Left ventricular diastolic parameters  are indeterminate.   2. Right ventricular systolic function was not well visualized. The right  ventricular size is normal.   3. The mitral valve is grossly normal. Trivial mitral valve  regurgitation. No evidence of mitral stenosis.   4. The aortic valve was not well visualized. Aortic valve regurgitation  is not visualized. No aortic stenosis is present.     Neuro/Psych TIA negative psych ROS   GI/Hepatic Neg liver ROS,GERD  Medicated and Controlled,,SBO, NGT    Endo/Other  diabetes    Renal/GU Renal Insufficiency and CRFRenal diseaseCr 1.3  negative genitourinary   Musculoskeletal negative musculoskeletal ROS (+)    Abdominal   Peds  Hematology negative hematology ROS (+) Hb 15.2   Anesthesia Other Findings Plavix LD 12/25 (4d)  Reproductive/Obstetrics negative OB ROS                             Anesthesia Physical Anesthesia Plan  ASA: 3  Anesthesia Plan: General   Post-op  Pain Management: Ofirmev IV (intra-op)*, Dilaudid IV and Ketamine IV*   Induction: Intravenous  PONV Risk Score and Plan: Ondansetron, Dexamethasone and Treatment may vary due to age or medical condition  Airway Management Planned: Oral ETT  Additional Equipment: None  Intra-op Plan:   Post-operative Plan: Extubation in OR  Informed Consent: I have reviewed the patients History and Physical, chart, labs and discussed the procedure including the risks, benefits and alternatives for the proposed anesthesia with the patient or authorized representative who has indicated his/her understanding and acceptance.     Dental advisory given  Plan Discussed with: CRNA  Anesthesia Plan Comments:         Anesthesia Quick Evaluation

## 2023-09-29 NOTE — Transfer of Care (Signed)
Immediate Anesthesia Transfer of Care Note  Patient: Antonio Serrano  Procedure(s) Performed: LAPAROSCOPY DIAGNOSTIC; BILATERAL TAP BLOCK; LAPAROSCOPIC APPENDECTOMY; LYSIS OF ADHESION (Abdomen)  Patient Location: PACU  Anesthesia Type:General  Level of Consciousness: responds to stimulation  Airway & Oxygen Therapy: Patient Spontanous Breathing and Patient connected to face mask oxygen  Post-op Assessment: Report given to RN and Post -op Vital signs reviewed and stable  Post vital signs: Reviewed and stable  Last Vitals:  Vitals Value Taken Time  BP 142/58 09/29/23 1152  Temp    Pulse 59 09/29/23 1156  Resp 17 09/29/23 1156  SpO2 100 % 09/29/23 1156  Vitals shown include unfiled device data.  Last Pain:  Vitals:   09/29/23 0915  TempSrc:   PainSc: 0-No pain         Complications: No notable events documented.

## 2023-09-29 NOTE — Progress Notes (Signed)
PROGRESS NOTE    Antonio Serrano  GNF:[id number] DOB: 1945-12-08 DOA: 09/25/2023 PCP: Jeoffrey Massed, MD     Brief Narrative:  Antonio Serrano is a 77 y.o. male with medical history significant of no reported intra-abdominal surgery.  Patient was in his usual state of health till approximately day before yesterday when he reports some mild onset of crampy abdominal pain and more than 10 episodes of vomiting. Patient has been unable to tolerate p.o. diet however because of nausea.  In the ER patient's CAT scan is concerning for small bowel obstruction, patient has had nasogastric tube placed with about 850 cc of dark brown/feculent aspiration thus far.  General surgery was consulted.  New events last 24 hours / Subjective: Patient was urgently taken to the OR this morning due to lack of improvement in his small bowel obstruction.  He underwent diagnostic laparoscopy with lysis of adhesion, laparoscopic appendectomy.  Patient seen postoperatively, feeling well overall.  NG tube is in place.  He denies any nausea, severe abdominal pain.  Assessment & Plan:   Principal Problem:   SBO (small bowel obstruction) (HCC) Active Problems:   Chronic renal insufficiency, stage III (moderate) (HCC)   Type 2 diabetes mellitus with stage 3a chronic kidney disease, without long-term current use of insulin (HCC)   Constipation, chronic   Hyponatremia   Leukocytosis   AKI (acute kidney injury) (HCC)   Hypokalemia   MRSA (methicillin resistant Staphylococcus aureus) colonization  SBO -Status post laparoscopic lysis of adhesion, laparoscopic appendectomy -NG tube in place  -General Surgery following -Remains n.p.o.  AKI -Baseline creatinine 1.3 -Resolved with IV fluid  Hypokalemia -Replace  MRSA in urine -Urine culture from 12/25 showed MRSA. Patient has no urinary symptoms, fever, blood cultures are negative. No systemic signs of infection. Repeat UA on 12/27 is negative. Discussed with ID on  call. In setting of no systemic signs and symptoms, this is likely a contaminant. Monitor.   Holding home oral medications including Norvasc, Plavix, Myrbetriq    DVT prophylaxis:  heparin injection 5,000 Units Start: 09/26/23 0600 SCDs Start: 09/25/23 2127  Code Status: Full code Family Communication: None at bedside this afternoon Disposition Plan: Home Status is: Inpatient Remains inpatient appropriate because: Postop course    Antimicrobials:  Anti-infectives (From admission, onward)    Start     Dose/Rate Route Frequency Ordered Stop   09/29/23 2200  cefoTEtan (CEFOTAN) 2 g in sodium chloride 0.9 % 100 mL IVPB        2 g 200 mL/hr over 30 Minutes Intravenous Every 12 hours 09/29/23 1313 09/30/23 0959   09/29/23 1215  cefoTEtan (CEFOTAN) 2 g in sodium chloride 0.9 % 100 mL IVPB        2 g 200 mL/hr over 30 Minutes Intravenous On call to O.R. 09/29/23 0759 09/29/23 1058   09/29/23 1200  vancomycin (VANCOCIN) IVPB 1000 mg/200 mL premix       Note to Pharmacy: Pharmacy may adjust dosing strength, interval, or rate of medication as needed for optimal therapy for the patient  Send with patient on call to the OR.  Anesthesia to complete antibiotic administration <47min prior to incision per [facility].   1,000 mg 200 mL/hr over 60 Minutes Intravenous On call to O.R. 09/29/23 0748 09/29/23 1055   09/29/23 0923  sodium chloride 0.9 % with cefoTEtan (CEFOTAN) ADS Med       Note to Pharmacy: Myrlene Broker M: cabinet override      09/29/23 8469 09/29/23  1028   09/29/23 0923  vancomycin (VANCOCIN) 1-5 GM/200ML-% IVPB       Note to Pharmacy: Myrlene Broker M: cabinet override      09/29/23 0923 09/29/23 1028   09/25/23 2200  metroNIDAZOLE (FLAGYL) IVPB 500 mg  Status:  Discontinued        500 mg 100 mL/hr over 60 Minutes Intravenous Every 12 hours 09/25/23 2059 09/26/23 1142   09/25/23 2115  cefTRIAXone (ROCEPHIN) 2 g in sodium chloride 0.9 % 100 mL IVPB  Status:  Discontinued         2 g 200 mL/hr over 30 Minutes Intravenous Every 24 hours 09/25/23 2059 09/26/23 1142        Objective: Vitals:   09/29/23 1215 09/29/23 1230 09/29/23 1245 09/29/23 1309  BP: (!) 144/72 127/68 122/67 (!) 140/68  Pulse: 68 69  66  Resp: 18 18  18   Temp:  (!) 97.4 F (36.3 C)  (!) 97.5 F (36.4 C)  TempSrc:    Oral  SpO2: 100% 97%  95%  Weight:      Height:        Intake/Output Summary (Last 24 hours) at 09/29/2023 1351 Last data filed at 09/29/2023 1234 Gross per 24 hour  Intake 2350 ml  Output 1175 ml  Net 1175 ml   Filed Weights   09/25/23 1516  Weight: 72.6 kg    Examination:  General exam: Appears calm and comfortable  Respiratory system: Clear to auscultation. Respiratory effort normal. No respiratory distress. No conversational dyspnea.  Cardiovascular system: S1 & S2 heard, RRR. No murmurs. No pedal edema. Gastrointestinal system: Abdomen is nondistended, soft, NGT in place, no BS heard Central nervous system: Alert and oriented. No focal neurological deficits. Speech clear.  Extremities: Symmetric in appearance  Skin: No rashes, lesions or ulcers on exposed skin  Psychiatry: Judgement and insight appear normal. Mood & affect appropriate.   Data Reviewed: I have personally reviewed following labs and imaging studies  CBC: Recent Labs  Lab 09/25/23 1530 09/25/23 2140 09/26/23 0444 09/29/23 0423  WBC 16.0* 10.3 6.9 6.6  NEUTROABS 13.9*  --   --   --   HGB 15.4 15.6 15.2 13.5  HCT 44.8 45.0 44.0 39.7  MCV 86.3 86.7 87.1 89.4  PLT 307 306 271 269   Basic Metabolic Panel: Recent Labs  Lab 09/25/23 2140 09/26/23 0444 09/27/23 0425 09/28/23 0515 09/29/23 0424  NA 127* 128* 132* 134* 134*  K 4.6 4.6 2.9* 3.0* 3.3*  CL 96* 95* 86* 92* 97*  CO2 19* 22 34* 29 27  GLUCOSE 132* 191* 158* 87 138*  BUN 47* 44* 25* 22 20  CREATININE 3.69* 3.13* 1.39* 1.43* 1.30*  CALCIUM 8.6* 8.6* 8.2* 8.3* 8.5*  MG 2.0  --   --  2.0 1.9   GFR: Estimated Creatinine  Clearance: 44.3 mL/min (A) (by C-G formula based on SCr of 1.3 mg/dL (H)). Liver Function Tests: Recent Labs  Lab 09/25/23 1711  AST 17  ALT 10  ALKPHOS 105  BILITOT 0.9  PROT 6.9  ALBUMIN 3.7   No results for input(s): "LIPASE", "AMYLASE" in the last 168 hours. No results for input(s): "AMMONIA" in the last 168 hours. Coagulation Profile: Recent Labs  Lab 09/26/23 0444  INR 1.2   Cardiac Enzymes: No results for input(s): "CKTOTAL", "CKMB", "CKMBINDEX", "TROPONINI" in the last 168 hours. BNP (last 3 results) No results for input(s): "PROBNP" in the last 8760 hours. HbA1C: No results for input(s): "HGBA1C" in the  last 72 hours. CBG: No results for input(s): "GLUCAP" in the last 168 hours. Lipid Profile: No results for input(s): "CHOL", "HDL", "LDLCALC", "TRIG", "CHOLHDL", "LDLDIRECT" in the last 72 hours. Thyroid Function Tests: No results for input(s): "TSH", "T4TOTAL", "FREET4", "T3FREE", "THYROIDAB" in the last 72 hours. Anemia Panel: No results for input(s): "VITAMINB12", "FOLATE", "FERRITIN", "TIBC", "IRON", "RETICCTPCT" in the last 72 hours. Sepsis Labs: Recent Labs  Lab 09/25/23 2202  LATICACIDVEN 2.0*    Recent Results (from the past 240 hours)  Urine Culture     Status: Abnormal   Collection Time: 09/25/23  7:36 PM   Specimen: Urine, Clean Catch  Result Value Ref Range Status   Specimen Description   Final    URINE, CLEAN CATCH Performed at [facility], [address] [place], [place] [address]    Special Requests   Final    NONE Performed at [facility], [address] [place], [place] [address]    Culture (A)  Final    >=100,000 COLONIES/mL METHICILLIN RESISTANT STAPHYLOCOCCUS AUREUS   Report Status 09/28/2023 FINAL  Final   Organism ID, Bacteria METHICILLIN RESISTANT STAPHYLOCOCCUS AUREUS (A)  Final      Susceptibility   Methicillin resistant staphylococcus aureus - MIC*    CIPROFLOXACIN >=8 RESISTANT  Resistant     GENTAMICIN <=0.5 SENSITIVE Sensitive     NITROFURANTOIN <=16 SENSITIVE Sensitive     OXACILLIN >=4 RESISTANT Resistant     TETRACYCLINE <=1 SENSITIVE Sensitive     VANCOMYCIN <=0.5 SENSITIVE Sensitive     TRIMETH/SULFA <=10 SENSITIVE Sensitive     RIFAMPIN <=0.5 SENSITIVE Sensitive     Inducible Clindamycin NEGATIVE Sensitive     LINEZOLID 2 SENSITIVE Sensitive     * >=100,000 COLONIES/mL METHICILLIN RESISTANT STAPHYLOCOCCUS AUREUS  Culture, blood (Routine X 2) w Reflex to ID Panel     Status: None (Preliminary result)   Collection Time: 09/25/23  9:30 PM   Specimen: BLOOD RIGHT FOREARM  Result Value Ref Range Status   Specimen Description   Final    BLOOD RIGHT FOREARM Performed at [facility], [address] [place], [place] [address]    Special Requests   Final    BOTTLES DRAWN AEROBIC AND ANAEROBIC Blood Culture results may not be optimal due to an inadequate volume of blood received in culture bottles Performed at [facility], [address], [place], [place] [address]    Culture   Final    NO GROWTH 4 DAYS Performed at [facility], [address], [place], [place] [address]    Report Status PENDING  Incomplete  Culture, blood (Routine X 2) w Reflex to ID Panel     Status: None (Preliminary result)   Collection Time: 09/25/23  9:40 PM   Specimen: BLOOD  Result Value Ref Range Status   Specimen Description   Final    BLOOD RIGHT ANTECUBITAL Performed at [facility], [address] [place], [place] [address]    Special Requests   Final    BOTTLES DRAWN AEROBIC AND ANAEROBIC Blood Culture adequate volume Performed at [facility], [address], [place], [place] [address]    Culture   Final    NO GROWTH 4 DAYS Performed at [facility], [address], [place], [place] [address]    Report Status PENDING  Incomplete      Radiology Studies: DG Abd Portable  2V Result Date: 09/29/2023  CLINICAL DATA:  Small-bowel obstruction EXAM: PORTABLE ABDOMEN - 2 VIEW COMPARISON:  09/28/2023 FINDINGS: Enteric tube remains coiled in the stomach. Numerous dilated small bowel loops are again seen measuring up to 4.3 cm in diameter, previously 5.1 cm. No gross free intraperitoneal air. IMPRESSION: Persistent small bowel obstruction, slightly improved. Electronically Signed   By: Duanne Guess D.O.   On: 09/29/2023 10:47   DG Abd Portable 2V Result Date: 09/28/2023 CLINICAL DATA:  [id number] SBO (small bowel obstruction) (HCC) [id number] EXAM: PORTABLE ABDOMEN - 2 VIEW COMPARISON:  09/27/2023 FINDINGS: Enteric tube remains coiled in the stomach. Persistently dilated loops of small bowel within the mid and upper abdomen measuring up to 5.1 cm in diameter, minimally increased compared to the prior study. No gross free intraperitoneal air. IMPRESSION: Findings of small-bowel obstruction with slightly increasing small-bowel caliber compared to the prior study. Electronically Signed   By: Duanne Guess D.O.   On: 09/28/2023 13:45      Scheduled Meds:  bisacodyl  10 mg Rectal Daily   heparin  5,000 Units Subcutaneous Q8H   pantoprazole (PROTONIX) IV  40 mg Intravenous Q12H   Continuous Infusions:  cefoTEtan (CEFOTAN) IV     dextrose 5 % and 0.45 % NaCl with KCl 40 mEq/L 75 mL/hr at 09/29/23 1315   lactated ringers       LOS: 4 days   Time spent: 25 minutes   Noralee Stain, DO [facility] 09/29/2023, 1:51 PM   Available via Epic secure chat 7am-7pm After these hours, please refer to coverage provider listed on [email or web address]

## 2023-09-29 NOTE — Progress Notes (Signed)
I updated the patient's status to the  patient, his son and daughter-in-law at bedside.  Also nurse Rosey Bath.   Recommendations were made.  Questions were answered.  They expressed understanding & appreciation.  All agree with proceeding with surgery

## 2023-09-29 NOTE — Anesthesia Procedure Notes (Signed)
Procedure Name: Intubation Date/Time: 09/29/2023 10:22 AM  Performed by: Burgess Estelle, CRNAPre-anesthesia Checklist: Patient identified, Emergency Drugs available, Suction available and Patient being monitored Patient Re-evaluated:Patient Re-evaluated prior to induction Oxygen Delivery Method: Circle System Utilized Preoxygenation: Pre-oxygenation with 100% oxygen Induction Type: IV induction and Rapid sequence Laryngoscope Size: Mac and 4 Grade View: Grade I Tube type: Oral Tube size: 8.0 mm Number of attempts: 1 Airway Equipment and Method: Stylet and Oral airway Placement Confirmation: ETT inserted through vocal cords under direct vision, positive ETCO2 and breath sounds checked- equal and bilateral Secured at: 23 cm Tube secured with: Tape Dental Injury: Teeth and Oropharynx as per pre-operative assessment

## 2023-09-29 NOTE — Progress Notes (Signed)
Unable to ambulate pt this pm (4 hours postop) d/t pt weakness and sedation.

## 2023-09-29 NOTE — Discharge Instructions (Signed)
################################################################  LAPAROSCOPIC SURGERY: POST OP INSTRUCTIONS  ######################################################################  EAT Gradually transition to a high fiber diet with a fiber supplement over the next few weeks after discharge.  Start with a pureed / full liquid diet (see below)  WALK Walk an hour a day.  Control your pain to do that.    CONTROL PAIN Control pain so that you can walk, sleep, tolerate sneezing/coughing, go up/down stairs.  HAVE A BOWEL MOVEMENT DAILY Keep your bowels regular to avoid problems.  OK to try a laxative to override constipation.  OK to use an antidairrheal to slow down diarrhea.  Call if not better after 2 tries  CALL IF YOU HAVE PROBLEMS/CONCERNS Call if you are still struggling despite following these instructions. Call if you have concerns not answered by these instructions  ######################################################################    DIET: Follow a light bland diet & liquids the first 24 hours after arrival home, such as soup, liquids, starches, etc.  Be sure to drink plenty of fluids.  Quickly advance to a usual solid diet within a few days.  Avoid fast food or heavy meals as your are more likely to get nauseated or have irregular bowels.  A low-fat, high-fiber diet for the rest of your life is ideal.  Take your usually prescribed home medications unless otherwise directed. Blood thinners:  You can restart any strong blood thinners after the second postoperative day  for example: COUMADIN (warfarin), XERELTO (rivaroxaban), ELIQUIS (apixaban), PLAVIX (clopidigrel), BRILINTA (ticagrelor), EFFIENT (prasugrel), PRADAXA (dabigatran), etc  Continue aspirin before & after surgery..     Some oozing/bleeding the first 1-2 weeks is common but should taper down & be small volume.    If you are passing many large clots or having uncontrolling bleeding, call your surgeon  PAIN  CONTROL: Pain is best controlled by a usual combination of three different methods TOGETHER: Ice/Heat Over the counter pain medication Prescription pain medication Most patients will experience some swelling and bruising around the incisions.  Ice packs or heating pads (30-60 minutes up to 6 times a day) will help. Use ice for the first few days to help decrease swelling and bruising, then switch to heat to help relax tight/sore spots and speed recovery.  Some people prefer to use ice alone, heat alone, alternating between ice & heat.  Experiment to what works for you.  Swelling and bruising can take several weeks to resolve.   It is helpful to take an over-the-counter pain medication regularly for the first few weeks.  Choose one of the following that works best for you: Naproxen (Aleve, etc)  Two 220mg  tabs twice a day Ibuprofen (Advil, etc) Three 200mg  tabs four times a day (every meal & bedtime) Acetaminophen (Tylenol, etc) 500-650mg  four times a day (every meal & bedtime) A  prescription for pain medication (such as oxycodone, hydrocodone, tramadol, gabapentin, methocarbamol, etc) should be given to you upon discharge.  Take your pain medication as prescribed.  If you are having problems/concerns with the prescription medicine (does not control pain, nausea, vomiting, rash, itching, etc), please call us [phone] to see if we need to switch you to a different pain medicine that will work better for you and/or control your side effect better. If you need a refill on your pain medication, please give [place] 48 hour notice.  contact your pharmacy.  They will contact our office to request authorization. Prescriptions will not be filled after 5 pm or on week-ends  AVOID GETTING CONSTIPATED.   a.  Between the surgery and the pain medications, it is common to experience some constipation.  b.  Drink plenty of liquids c   ake a fiber supplement 2 times day (such as Metamucil, Citrucel, FiberCon,  MiraLax, etc) to have a bowel movement every day. d.  If you have not had a BM by 2 days after surgery: -drink liquids only until you have a bowel movement - take MiraLAX 2 doses every 2 hours until you have a bowel movement   Watch out for diarrhea.   If you have many loose bowel movements, simplify your diet to bland foods & liquids for a few days.   Stop any stool softeners and decrease your fiber supplement.   Switching to mild anti-diarrheal medications (Kayopectate, Pepto Bismol) can help.   If this worsens or does not improve, please call us.  Wash / shower every day.  You may shower over the dressings as they are waterproof.  Continue to shower over incision(s) after the dressing is off.  It is good for closed incisions and even open wounds to be washed every day.  Shower every day.  Short baths are fine.  Wash the incisions and wounds clean with soap & water.    You may leave closed incisions open to air if it is dry.   You may cover the incision with clean gauze & replace it after your daily shower for comfort.  TEGADERM:  You have clear gauze band-aid dressings over your closed incision(s).  Remove the dressings 2 days after surgery = 12/31.    ACTIVITIES as tolerated:   You may resume regular (light) daily activities beginning the next day--such as daily self-care, walking, climbing stairs--gradually increasing activities as tolerated.  If you can walk 30 minutes without difficulty, it is safe to try more intense activity such as jogging, treadmill, bicycling, low-impact aerobics, swimming, etc. Save the most intensive and strenuous activity for last such as sit-ups, heavy lifting, contact sports, etc  Refrain from any heavy lifting or straining until you are off narcotics for pain control.   DO NOT PUSH THROUGH PAIN.  Let pain be your guide: If it hurts to do something, don't do it.  Pain is your body warning you to avoid that activity for another week until the pain goes  down. You may drive when you are no longer taking prescription pain medication, you can comfortably wear a seatbelt, and you can safely maneuver your car and apply brakes. You may have sexual intercourse when it is comfortable.  FOLLOW UP in our office Please call [facility] at [phone] to set up an appointment to see your surgeon in the office for a follow-up appointment approximately 2-3 weeks after your surgery. Make sure that you call for this appointment the day you arrive home to insure a convenient appointment time.  10. IF YOU HAVE DISABILITY OR FAMILY LEAVE FORMS, BRING THEM TO THE OFFICE FOR PROCESSING.  DO NOT GIVE THEM TO YOUR DOCTOR.   WHEN TO CALL us [phone]: Poor pain control Reactions / problems with new medications (rash/itching, nausea, etc)  Fever over 101.5 F (38.5 C) Inability to urinate Nausea and/or vomiting Worsening swelling or bruising Continued bleeding from incision. Increased pain, redness, or drainage from the incision   The clinic staff is available to answer your questions during regular business hours (8:30am-5pm).  Please dont hesitate to call and ask to speak to one of our nurses for clinical concerns.   If you have a  medical emergency, go to the nearest emergency room or call 911.  A surgeon from [facility] is always on call at the [facility], [place] [address], [place], [place]  [address] ? MAIN: [phone] ? TOLL FREE: [phone] ?  FAX [phone] [email or web address]  ##############################################################    GETTING TO GOOD BOWEL HEALTH.  ######################################################################  EAT Gradually transition to a high fiber diet with a fiber supplement over the next few weeks after discharge.  Start with a pureed / full liquid diet (see below)  WALK Walk an hour a day.  Control your pain to do that.     HAVE A BOWEL MOVEMENT DAILY Keep your bowels regular to avoid problems.  OK to try a laxative to override constipation.  OK to use an antidairrheal to slow down diarrhea.  Call if not better after 2 tries  CALL IF YOU HAVE PROBLEMS/CONCERNS Call if you are still struggling despite following these instructions. Call if you have concerns not answered by these instructions  ######################################################################   Irregular bowel habits such as constipation and diarrhea can lead to many problems over time.  Having one soft bowel movement a day is the most important way to prevent further problems.  The anorectal canal is designed to handle stretching and feces to safely manage our ability to get rid of solid waste (feces, poop, stool) out of our body.  BUT, hard constipated stools can act like ripping concrete bricks and diarrhea can be a burning fire to this very sensitive area of our body, causing inflamed hemorrhoids, anal fissures, increasing risk is perirectal abscesses, abdominal pain/bloating, an making irritable bowel worse.      The goal: ONE SOFT BOWEL MOVEMENT A DAY!  To have soft, regular bowel movements:  Drink plenty of fluids, consider 4-6 tall glasses of water a day.   Take plenty of fiber.  Fiber is the undigested part of plant food that passes into the colon, acting s natures broom to encourage bowel motility and movement.  Fiber can absorb and hold large amounts of water. This results in a larger, bulkier stool, which is soft and easier to pass. Work gradually over several weeks up to 6 servings a day of fiber (25g a day even more if needed) in the form of: Vegetables -- Root (potatoes, carrots, turnips), leafy green (lettuce, salad greens, celery, spinach), or cooked high residue (cabbage, broccoli, etc) Fruit -- Fresh (unpeeled skin & pulp), Dried (prunes, apricots, cherries, etc ),  or stewed ( applesauce)  Whole grain breads, pasta, etc  (whole wheat)  Bran cereals  Bulking Agents -- This type of water-retaining fiber generally is easily obtained each day by one of the following:  Psyllium bran -- The psyllium plant is remarkable because its ground seeds can retain so much water. This product is available as Metamucil, Konsyl, Effersyllium, Per Diem Fiber, or the less expensive generic preparation in drug and health food stores. Although labeled a laxative, it really is not a laxative.  Methylcellulose -- This is another fiber derived from wood which also retains water. It is available as Citrucel. Polyethylene Glycol - and artificial fiber commonly called Miralax or Glycolax.  It is helpful for people with gassy or bloated feelings with regular fiber Flax Seed - a less gassy fiber than psyllium No reading or other relaxing activity while on the toilet. If bowel movements take longer than 5 minutes, you are too constipated AVOID CONSTIPATION.  High  fiber and water intake usually takes care of this.  Sometimes a laxative is needed to stimulate more frequent bowel movements, but  Laxatives are not a good long-term solution as it can wear the colon out.  They can help jump-start bowels if constipated, but should be relied on constantly without discussing with your doctor Osmotics (Milk of Magnesia, Fleets phosphosoda, Magnesium citrate, MiraLax, GoLytely) are safer than  Stimulants (Senokot, Castor Oil, Dulcolax, Ex Lax)    Avoid taking laxatives for more than 7 days in a row.  IF SEVERELY CONSTIPATED, try a Bowel Retraining Program: Do not use laxatives.  Eat a diet high in roughage, such as bran cereals and leafy vegetables.  Drink six (6) ounces of prune or apricot juice each morning.  Eat two (2) large servings of stewed fruit each day.  Take one (1) heaping tablespoon of a psyllium-based bulking agent twice a day. Use sugar-free sweetener when possible to avoid excessive calories.  Eat a normal breakfast.  Set aside 15  minutes after breakfast to sit on the toilet, but do not strain to have a bowel movement.  If you do not have a bowel movement by the third day, use an enema and repeat the above steps.   CONTROLLING DIARRHEA  TAKE A FIBER SUPPLEMENT (FiberCon or Benefiner soluble fiber) twice a day - to thicken stools by absorbing excess fluid and retrain the intestines to act more normally.  Slowly increase the dose over a few weeks.  Too much fiber too soon can backfire and cause cramping & bloating.  TAKE AN IRON SUPPLEMENT twice a day to naturally constipate your bowels.  Usually ferrous sulfate 325mg  twice a day)  TAKE ANTI-DIARRHEAL MEDICINES: Loperamide (Imodium) can slow down diarrhea.  Start with two tablets (= 4mg ) first and then try one tablet every 6 hours.  Can go up to 2 pills four times day (8 pills of 2mg  max) Avoid if you are having fevers or severe pain.  If you are not better or start feeling worse, stop all medicines and call your doctor for advice LoMotil (Diphenoxylate / Atropine) is another medicine that can constipate & slow down bowel moevements Pepto Bismol (bismuth) can gently thicken bowels as well  If diarrhea is worse,: drink plenty of liquids and try simpler foods for a few days to avoid stressing your intestines further. Avoid dairy products (especially milk & ice cream) for a short time.  The intestines often can lose the ability to digest lactose when stressed. Avoid foods that cause gassiness or bloating.  Typical foods include beans and other legumes, cabbage, broccoli, and dairy foods.  Every person has some sensitivity to other foods, so listen to our body and avoid those foods that trigger problems for you.Call your doctor if you are getting worse or not better.  Sometimes further testing (cultures, endoscopy, X-ray studies, bloodwork, etc) may be needed to help diagnose and treat the cause of the diarrhea. Take extra anti-diarrheal medicines (maximum is 8 pills of 2mg   loperamide a day)  TROUBLESHOOTING IRREGULAR BOWELS 1) Avoid extremes of bowel movements (no bad constipation/diarrhea) 2) Miralax 17gm mixed in 8oz. water or juice-daily. May use BID as needed.  3) Gas-x,Phazyme, etc. as needed for gas & bloating.  4) Soft,bland diet. No spicy,greasy,fried foods.  5) Prilosec over-the-counter as needed  6) May hold gluten/wheat products from diet to see if symptoms improve.  7)  May try probiotics (Align, Activa, etc) to help calm the bowels down 7) If symptoms  become worse call back immediately.

## 2023-09-30 ENCOUNTER — Encounter (HOSPITAL_COMMUNITY): Payer: Self-pay | Admitting: Surgery

## 2023-09-30 DIAGNOSIS — K56609 Unspecified intestinal obstruction, unspecified as to partial versus complete obstruction: Secondary | ICD-10-CM | POA: Diagnosis not present

## 2023-09-30 LAB — CULTURE, BLOOD (ROUTINE X 2)
Culture: NO GROWTH
Culture: NO GROWTH
Special Requests: ADEQUATE

## 2023-09-30 LAB — BASIC METABOLIC PANEL
Anion gap: 8 (ref 5–15)
BUN: 17 mg/dL (ref 8–23)
CO2: 27 mmol/L (ref 22–32)
Calcium: 8.2 mg/dL — ABNORMAL LOW (ref 8.9–10.3)
Chloride: 100 mmol/L (ref 98–111)
Creatinine, Ser: 1.48 mg/dL — ABNORMAL HIGH (ref 0.61–1.24)
GFR, Estimated: 49 mL/min — ABNORMAL LOW (ref >60)
Glucose, Bld: 173 mg/dL — ABNORMAL HIGH (ref 70–99)
Potassium: 4.7 mmol/L (ref 3.5–5.1)
Sodium: 135 mmol/L (ref 135–145)

## 2023-09-30 LAB — PREALBUMIN: Prealbumin: 8 mg/dL — ABNORMAL LOW (ref 18–38)

## 2023-09-30 LAB — CBC
HCT: 37.4 % — ABNORMAL LOW (ref 39.0–52.0)
Hemoglobin: 12.7 g/dL — ABNORMAL LOW (ref 13.0–17.0)
MCH: 30.5 pg (ref 26.0–34.0)
MCHC: 34 g/dL (ref 30.0–36.0)
MCV: 89.9 fL (ref 80.0–100.0)
Platelets: 252 10*3/uL (ref 150–400)
RBC: 4.16 MIL/uL — ABNORMAL LOW (ref 4.22–5.81)
RDW: 14.4 % (ref 11.5–15.5)
WBC: 8.8 10*3/uL (ref 4.0–10.5)
nRBC: 0 % (ref 0.0–0.2)

## 2023-09-30 LAB — MAGNESIUM: Magnesium: 1.9 mg/dL (ref 1.7–2.4)

## 2023-09-30 MED ORDER — LACTATED RINGERS IV SOLN: INTRAVENOUS | Status: DC

## 2023-09-30 NOTE — Evaluation (Addendum)
Physical Therapy Evaluation Patient Details Name: Antonio Serrano MRN: [id number] DOB: 08/14/1946 Today's Date: 09/30/2023  History of Present Illness  77 y.o. male presented with  mild crampy abdominal pain and more than 10 episodes of vomiting.  In the ER patient's CAT scan is concerning for small bowel obstruction, patient has had nasogastric tube placed. General surgery was consulted.  Due to lack of improvement, patient underwent diagnostic laparoscopy with lysis of adhesion, laparoscopic appendectomy by Dr. Michaell Cowing on 12/29. PMH: TIA, HTN, THA, CKD III  Clinical Impression  Pt admitted with above diagnosis.  PT amb with rollator at his baseline; reports dtr in law assists with iADLs. Pt agreeable to sit EOB however feels too fatigued to attempt incr amb, pt agrees to mobilize more in future therapy sessions.  May benefit from HHPT pending progress and LOS  Pt currently with functional limitations due to the deficits listed below (see PT Problem List). Pt will benefit from acute skilled PT to increase their independence and safety with mobility to allow discharge.           If plan is discharge home, recommend the following: A little help with walking and/or transfers;A little help with bathing/dressing/bathroom;Help with stairs or ramp for entrance;Assist for transportation;Assistance with cooking/housework   Can travel by private vehicle        Equipment Recommendations None recommended by PT  Recommendations for Other Services       Functional Status Assessment Patient has had a recent decline in their functional status and demonstrates the ability to make significant improvements in function in a reasonable and predictable amount of time.     Precautions / Restrictions Precautions Precautions: Fall Precaution Comments: NGT Restrictions Weight Bearing Restrictions Per Provider Order: No      Mobility  Bed Mobility Overal bed mobility: Needs Assistance Bed Mobility: Supine  to Sit, Sit to Supine     Supine to sit: Contact guard, Min assist Sit to supine: Contact guard assist   General bed mobility comments: incr time and effort, assist to elevate trunk    Transfers                   General transfer comment: declines today    Ambulation/Gait                  Stairs            Wheelchair Mobility     Tilt Bed    Modified Rankin (Stroke Patients Only)       Balance Overall balance assessment: Needs assistance (denies falls in last 3 months) Sitting-balance support: Feet supported, No upper extremity supported Sitting balance-Leahy Scale: Fair                                       Pertinent Vitals/Pain Pain Assessment Pain Assessment: No/denies pain    Home Living Family/patient expects to be discharged to:: Private residence Living Arrangements: Spouse/significant other Available Help at Discharge: Family Type of Home: House Home Access: Stairs to enter Entrance Stairs-Rails: Lawyer of Steps: 5   Home Layout: One level Home Equipment: Agricultural consultant (2 wheels);Rollator (4 wheels)      Prior Function Prior Level of Function : Independent/Modified Independent             Mobility Comments: uses rollator, denies falls ADLs Comments: reports independence     Extremity/Trunk Assessment  Upper Extremity Assessment Upper Extremity Assessment: Defer to OT evaluation    Lower Extremity Assessment Lower Extremity Assessment: Generalized weakness       Communication   Communication Communication: No apparent difficulties  Cognition Arousal: Alert Behavior During Therapy: WFL for tasks assessed/performed Overall Cognitive Status: Within Functional Limits for tasks assessed                                          General Comments      Exercises     Assessment/Plan    PT Assessment Patient needs continued PT services  PT Problem  List Decreased strength;Decreased range of motion;Decreased activity tolerance;Decreased balance;Decreased mobility       PT Treatment Interventions DME instruction;Gait training;Functional mobility training;Therapeutic activities;Therapeutic exercise;Patient/family education;Stair training    PT Goals (Current goals can be found in the Care Plan section)  Acute Rehab PT Goals PT Goal Formulation: With patient Time For Goal Achievement: 10/15/23 Potential to Achieve Goals: Good    Frequency Min 1X/week     Co-evaluation               AM-PAC PT "6 Clicks" Mobility  Outcome Measure Help needed turning from your back to your side while in a flat bed without using bedrails?: A Little Help needed moving from lying on your back to sitting on the side of a flat bed without using bedrails?: A Little Help needed moving to and from a bed to a chair (including a wheelchair)?: A Little Help needed standing up from a chair using your arms (e.g., wheelchair or bedside chair)?: A Little Help needed to walk in hospital room?: A Lot Help needed climbing 3-5 steps with a railing? : A Lot 6 Click Score: 16    End of Session   Activity Tolerance: Patient limited by fatigue Patient left: in bed;with call bell/phone within reach;with bed alarm set   PT Visit Diagnosis: Other abnormalities of gait and mobility (R26.89)    Time: 1610-9604 PT Time Calculation (min) (ACUTE ONLY): 15 min   Charges:   PT Evaluation $PT Eval Low Complexity: 1 Low   PT General Charges $$ ACUTE PT VISIT: 1 Visit         Tondra Reierson, PT  Acute Rehab Dept [facility]) [phone]  09/30/2023   [facility] 09/30/2023, 3:40 PM

## 2023-09-30 NOTE — Progress Notes (Signed)
1 Day Post-Op lap LOA, appendectomy Subjective: Feels a bit better, no bowel function yet  Objective: Vital signs in last 24 hours: Temp:  [97.4 F (36.3 C)-98.4 F (36.9 C)] 98.4 F (36.9 C) (12/30 0523) Pulse Rate:  [61-91] 73 (12/30 0523) Resp:  [13-21] 18 (12/30 0523) BP: (117-162)/(58-94) 140/73 (12/30 0523) SpO2:  [95 %-100 %] 95 % (12/30 0523)   Intake/Output from previous day: 12/29 0701 - 12/30 0700 In: 2045.1 [I.V.:1845.1; IV Piggyback:200] Out: 1705 [Urine:835; Emesis/NG output:650; Blood:20] Intake/Output this shift: No intake/output data recorded.   General appearance: alert and cooperative GI: soft, non-distended  Incision: no significant drainage  Lab Results:  Recent Labs    09/29/23 0423 09/30/23 0406  WBC 6.6 8.8  HGB 13.5 12.7*  HCT 39.7 37.4*  PLT 269 252   BMET Recent Labs    09/29/23 0424 09/30/23 0406  NA 134* 135  K 3.3* 4.7  CL 97* 100  CO2 27 27  GLUCOSE 138* 173*  BUN 20 17  CREATININE 1.30* 1.48*  CALCIUM 8.5* 8.2*   PT/INR No results for input(s): "LABPROT", "INR" in the last 72 hours. ABG No results for input(s): "PHART", "HCO3" in the last 72 hours.  Invalid input(s): "PCO2", "PO2"  MEDS, Scheduled  bisacodyl  10 mg Rectal Daily   Chlorhexidine Gluconate Cloth  6 each Topical Q0600   heparin  5,000 Units Subcutaneous Q8H   mupirocin ointment  1 Application Nasal BID   pantoprazole (PROTONIX) IV  40 mg Intravenous Q12H    Studies/Results: DG Abd Portable 2V Result Date: 09/29/2023 CLINICAL DATA:  Small-bowel obstruction EXAM: PORTABLE ABDOMEN - 2 VIEW COMPARISON:  09/28/2023 FINDINGS: Enteric tube remains coiled in the stomach. Numerous dilated small bowel loops are again seen measuring up to 4.3 cm in diameter, previously 5.1 cm. No gross free intraperitoneal air. IMPRESSION: Persistent small bowel obstruction, slightly improved. Electronically Signed   By: Duanne Guess D.O.   On: 09/29/2023 10:47   DG Abd  Portable 2V Result Date: 09/28/2023 CLINICAL DATA:  [id number] SBO (small bowel obstruction) (HCC) [id number] EXAM: PORTABLE ABDOMEN - 2 VIEW COMPARISON:  09/27/2023 FINDINGS: Enteric tube remains coiled in the stomach. Persistently dilated loops of small bowel within the mid and upper abdomen measuring up to 5.1 cm in diameter, minimally increased compared to the prior study. No gross free intraperitoneal air. IMPRESSION: Findings of small-bowel obstruction with slightly increasing small-bowel caliber compared to the prior study. Electronically Signed   By: Duanne Guess D.O.   On: 09/28/2023 13:45    Assessment: s/p Procedure(s): LAPAROSCOPY DIAGNOSTIC; BILATERAL TAP BLOCK; LAPAROSCOPIC APPENDECTOMY; LYSIS OF ADHESION Patient Active Problem List   Diagnosis Date Noted   MRSA (methicillin resistant Staphylococcus aureus) colonization 09/29/2023   Hypokalemia 09/28/2023   Leukocytosis 09/25/2023   AKI (acute kidney injury) (HCC) 09/25/2023   SBO (small bowel obstruction) (HCC) 09/25/2023   Esophageal dysphagia 06/16/2023   Gastritis and gastroduodenitis 06/16/2023   Coffee ground emesis 06/16/2023   Acute blood loss anemia 06/15/2023   Upper GI bleed 06/14/2023   UTI (urinary tract infection) 04/19/2023   Thigh pain 04/19/2023   Vomiting 04/19/2023   TIA (transient ischemic attack) 01/04/2022   Hyponatremia    Closed right hip fracture, initial encounter (HCC) 04/23/2021   Diabetes mellitus with complication (HCC) 03/27/2016   Hyperlipidemia 07/29/2015   Conjunctivitis 06/27/2015   Healthcare maintenance 06/27/2015   Type 2 diabetes mellitus with stage 3a chronic kidney disease, without long-term current use of insulin (HCC) 07/16/2014  Constipation, chronic 07/16/2014   Preventative health care 05/18/2014   Testicular pain, right 06/25/2013   Erectile dysfunction 05/31/2013   Chronic renal insufficiency, stage III (moderate) (HCC)    HTN (hypertension), benign 01/30/2012   Tobacco  dependence 01/30/2012    Expected post op course  Plan: Cont NG until return of bowel function   LOS: 5 days     .Vanita Panda, MD [facility], [place]    09/30/2023 8:18 AM

## 2023-09-30 NOTE — Plan of Care (Signed)
  Problem: Clinical Measurements: Goal: Will remain free from infection Outcome: Progressing   Problem: Skin Integrity: Goal: Risk for impaired skin integrity will decrease Outcome: Progressing   

## 2023-09-30 NOTE — Progress Notes (Signed)
OT Cancellation Note  Patient Details Name: Antonio Serrano MRN: [id number] DOB: June 04, 1946   Cancelled Treatment:    Reason Eval/Treat Not Completed: Patient declined, no reason specified Patient approached later this AM as per patient request with patient continuing to decline to participate. OT to continue to follow [name], MS Acute Rehabilitation Department Office# [phone]  09/30/2023, 11:21 AM

## 2023-09-30 NOTE — Plan of Care (Signed)
  Problem: Clinical Measurements: Goal: Ability to maintain clinical measurements within normal limits will improve Outcome: Progressing   Problem: Coping: Goal: Level of anxiety will decrease Outcome: Progressing   

## 2023-09-30 NOTE — Progress Notes (Signed)
OT Cancellation Note  Patient Details Name: Antonio Serrano MRN: [id number] DOB: 14-Nov-1945   Cancelled Treatment:    Reason Eval/Treat Not Completed: Patient declined, no reason specified Patient currently has suppository in process. OT to continue to follow and check back as schedule will allow.  [name], MS Acute Rehabilitation Department Office# [phone]  09/30/2023, 8:30 AM

## 2023-09-30 NOTE — Progress Notes (Signed)
PROGRESS NOTE    Antonio Serrano  ZOX:[id number] DOB: 1946-04-15 DOA: 09/25/2023 PCP: Jeoffrey Massed, MD     Brief Narrative:  Antonio Serrano is a 77 y.o. male with medical history significant of no reported intra-abdominal surgery.  Patient was in his usual state of health till approximately day before yesterday when he reports some mild onset of crampy abdominal pain and more than 10 episodes of vomiting. Patient has been unable to tolerate p.o. diet however because of nausea.  In the ER patient's CAT scan is concerning for small bowel obstruction, patient has had nasogastric tube placed with about 850 cc of dark brown/feculent aspiration thus far.  General surgery was consulted.  Due to lack of improvement, patient underwent diagnostic laparoscopy with lysis of adhesion, laparoscopic appendectomy by Dr. Michaell Cowing on 12/29.  New events last 24 hours / Subjective: No new complaints.  No bowel movement.  Asking when he can get his NG tube taken out.  Assessment & Plan:   Principal Problem:   SBO (small bowel obstruction) (HCC) Active Problems:   Chronic renal insufficiency, stage III (moderate) (HCC)   Type 2 diabetes mellitus with stage 3a chronic kidney disease, without long-term current use of insulin (HCC)   Constipation, chronic   Hyponatremia   Leukocytosis   AKI (acute kidney injury) (HCC)   Hypokalemia   MRSA (methicillin resistant Staphylococcus aureus) colonization  SBO -Status post laparoscopic lysis of adhesion, laparoscopic appendectomy 12/29 -NG tube in place  -General Surgery following -Remains n.p.o. -IV fluids while n.p.o.  AKI -Baseline creatinine 1.3 -Resolved with IV fluid  MRSA in urine -Urine culture from 12/25 showed MRSA. Patient has no urinary symptoms, fever, blood cultures are negative. No systemic signs of infection. Repeat UA on 12/27 is negative. Discussed with ID on call. In setting of no systemic signs and symptoms, this is likely a contaminant.  Monitor.   Holding home oral medications including Norvasc, Plavix, Myrbetriq    DVT prophylaxis:  heparin injection 5,000 Units Start: 09/26/23 0600 SCDs Start: 09/25/23 2127  Code Status: Full code Family Communication: None at bedside this afternoon Disposition Plan: Home Status is: Inpatient Remains inpatient appropriate because: Postop course    Antimicrobials:  Anti-infectives (From admission, onward)    Start     Dose/Rate Route Frequency Ordered Stop   09/29/23 2200  cefoTEtan (CEFOTAN) 2 g in sodium chloride 0.9 % 100 mL IVPB        2 g 200 mL/hr over 30 Minutes Intravenous Every 12 hours 09/29/23 1313 09/29/23 2125   09/29/23 1215  cefoTEtan (CEFOTAN) 2 g in sodium chloride 0.9 % 100 mL IVPB        2 g 200 mL/hr over 30 Minutes Intravenous On call to O.R. 09/29/23 0759 09/29/23 1058   09/29/23 1200  vancomycin (VANCOCIN) IVPB 1000 mg/200 mL premix       Note to Pharmacy: Pharmacy may adjust dosing strength, interval, or rate of medication as needed for optimal therapy for the patient  Send with patient on call to the OR.  Anesthesia to complete antibiotic administration <72min prior to incision per [facility]   1,000 mg 200 mL/hr over 60 Minutes Intravenous On call to O.R. 09/29/23 0748 09/29/23 1055   09/29/23 0923  sodium chloride 0.9 % with cefoTEtan (CEFOTAN) ADS Med       Note to Pharmacy: Myrlene Broker M: cabinet override      09/29/23 0923 09/29/23 1028   09/29/23 0923  vancomycin (VANCOCIN) 1-5 GM/200ML-%  IVPB       Note to Pharmacy: Myrlene Broker M: cabinet override      09/29/23 0923 09/29/23 1028   09/25/23 2200  metroNIDAZOLE (FLAGYL) IVPB 500 mg  Status:  Discontinued        500 mg 100 mL/hr over 60 Minutes Intravenous Every 12 hours 09/25/23 2059 09/26/23 1142   09/25/23 2115  cefTRIAXone (ROCEPHIN) 2 g in sodium chloride 0.9 % 100 mL IVPB  Status:  Discontinued        2 g 200 mL/hr over 30 Minutes Intravenous Every 24 hours 09/25/23 2059 09/26/23  1142        Objective: Vitals:   09/29/23 2226 09/30/23 0126 09/30/23 0523 09/30/23 0936  BP: (!) 155/82 134/85 (!) 140/73 (!) 146/66  Pulse: 81 84 73 90  Resp: 20 14 18 16   Temp: 98.2 F (36.8 C) 98.1 F (36.7 C) 98.4 F (36.9 C) (!) 97.4 F (36.3 C)  TempSrc: Oral Oral Oral   SpO2: 95% 95% 95% 99%  Weight:      Height:        Intake/Output Summary (Last 24 hours) at 09/30/2023 1220 Last data filed at 09/30/2023 1000 Gross per 24 hour  Intake 1425.82 ml  Output 1300 ml  Net 125.82 ml   Filed Weights   09/25/23 1516  Weight: 72.6 kg    Examination:  General exam: Appears calm and comfortable  Respiratory system: Clear to auscultation. Respiratory effort normal. No respiratory distress. No conversational dyspnea.  Cardiovascular system: S1 & S2 heard, RRR. No murmurs. No pedal edema. Gastrointestinal system: Abdomen is nondistended, soft, NGT in place, no BS heard Central nervous system: Alert and oriented. No focal neurological deficits. Speech clear.  Extremities: Symmetric in appearance  Skin: No rashes, lesions or ulcers on exposed skin  Psychiatry: Judgement and insight appear normal. Mood & affect appropriate.   Data Reviewed: I have personally reviewed following labs and imaging studies  CBC: Recent Labs  Lab 09/25/23 1530 09/25/23 2140 09/26/23 0444 09/29/23 0423 09/30/23 0406  WBC 16.0* 10.3 6.9 6.6 8.8  NEUTROABS 13.9*  --   --   --   --   HGB 15.4 15.6 15.2 13.5 12.7*  HCT 44.8 45.0 44.0 39.7 37.4*  MCV 86.3 86.7 87.1 89.4 89.9  PLT 307 306 271 269 252   Basic Metabolic Panel: Recent Labs  Lab 09/25/23 2140 09/26/23 0444 09/27/23 0425 09/28/23 0515 09/29/23 0424 09/30/23 0406  NA 127* 128* 132* 134* 134* 135  K 4.6 4.6 2.9* 3.0* 3.3* 4.7  CL 96* 95* 86* 92* 97* 100  CO2 19* 22 34* 29 27 27   GLUCOSE 132* 191* 158* 87 138* 173*  BUN 47* 44* 25* 22 20 17   CREATININE 3.69* 3.13* 1.39* 1.43* 1.30* 1.48*  CALCIUM 8.6* 8.6* 8.2* 8.3*  8.5* 8.2*  MG 2.0  --   --  2.0 1.9 1.9   GFR: Estimated Creatinine Clearance: 38.9 mL/min (A) (by C-G formula based on SCr of 1.48 mg/dL (H)). Liver Function Tests: Recent Labs  Lab 09/25/23 1711  AST 17  ALT 10  ALKPHOS 105  BILITOT 0.9  PROT 6.9  ALBUMIN 3.7   No results for input(s): "LIPASE", "AMYLASE" in the last 168 hours. No results for input(s): "AMMONIA" in the last 168 hours. Coagulation Profile: Recent Labs  Lab 09/26/23 0444  INR 1.2   Cardiac Enzymes: No results for input(s): "CKTOTAL", "CKMB", "CKMBINDEX", "TROPONINI" in the last 168 hours. BNP (last 3 results) No  results for input(s): "PROBNP" in the last 8760 hours. HbA1C: No results for input(s): "HGBA1C" in the last 72 hours. CBG: Recent Labs  Lab 09/29/23 1156  GLUCAP 150*   Lipid Profile: No results for input(s): "CHOL", "HDL", "LDLCALC", "TRIG", "CHOLHDL", "LDLDIRECT" in the last 72 hours. Thyroid Function Tests: No results for input(s): "TSH", "T4TOTAL", "FREET4", "T3FREE", "THYROIDAB" in the last 72 hours. Anemia Panel: No results for input(s): "VITAMINB12", "FOLATE", "FERRITIN", "TIBC", "IRON", "RETICCTPCT" in the last 72 hours. Sepsis Labs: Recent Labs  Lab 09/25/23 2202  LATICACIDVEN 2.0*    Recent Results (from the past 240 hours)  Urine Culture     Status: Abnormal   Collection Time: 09/25/23  7:36 PM   Specimen: Urine, Clean Catch  Result Value Ref Range Status   Specimen Description   Final    URINE, CLEAN CATCH Performed at [facility], [address], [place], [place] [address]    Special Requests   Final    NONE Performed at [facility], [address], [place], [place] [address]    Culture (A)  Final    >=100,000 COLONIES/mL METHICILLIN RESISTANT STAPHYLOCOCCUS AUREUS   Report Status 09/28/2023 FINAL  Final   Organism ID, Bacteria METHICILLIN RESISTANT STAPHYLOCOCCUS AUREUS (A)  Final      Susceptibility   Methicillin  resistant staphylococcus aureus - MIC*    CIPROFLOXACIN >=8 RESISTANT Resistant     GENTAMICIN <=0.5 SENSITIVE Sensitive     NITROFURANTOIN <=16 SENSITIVE Sensitive     OXACILLIN >=4 RESISTANT Resistant     TETRACYCLINE <=1 SENSITIVE Sensitive     VANCOMYCIN <=0.5 SENSITIVE Sensitive     TRIMETH/SULFA <=10 SENSITIVE Sensitive     RIFAMPIN <=0.5 SENSITIVE Sensitive     Inducible Clindamycin NEGATIVE Sensitive     LINEZOLID 2 SENSITIVE Sensitive     * >=100,000 COLONIES/mL METHICILLIN RESISTANT STAPHYLOCOCCUS AUREUS  Culture, blood (Routine X 2) w Reflex to ID Panel     Status: None (Preliminary result)   Collection Time: 09/25/23  9:30 PM   Specimen: BLOOD RIGHT FOREARM  Result Value Ref Range Status   Specimen Description   Final    BLOOD RIGHT FOREARM Performed at [facility], [address] [place], [place] [address]    Special Requests   Final    BOTTLES DRAWN AEROBIC AND ANAEROBIC Blood Culture results may not be optimal due to an inadequate volume of blood received in culture bottles Performed at [facility], [address] [place], [place] [address]    Culture   Final    NO GROWTH 4 DAYS Performed at [facility], [address] [place], [place] [address]    Report Status PENDING  Incomplete  Culture, blood (Routine X 2) w Reflex to ID Panel     Status: None (Preliminary result)   Collection Time: 09/25/23  9:40 PM   Specimen: BLOOD  Result Value Ref Range Status   Specimen Description   Final    BLOOD RIGHT ANTECUBITAL Performed at [facility], [address] [place], [place] [address]    Special Requests   Final    BOTTLES DRAWN AEROBIC AND ANAEROBIC Blood Culture adequate volume Performed at [facility], [address], [place], [place] [address]    Culture   Final    NO GROWTH 4 DAYS Performed at [facility], [address] [place], [place] [address]    Report  Status PENDING  Incomplete  Surgical pcr screen     Status: Abnormal   Collection Time: 09/29/23  8:19 AM   Specimen: Nasal Mucosa; Nasal Swab  Result Value Ref Range Status   MRSA, PCR POSITIVE (A) NEGATIVE Final   Staphylococcus aureus POSITIVE (A) NEGATIVE Final    Comment: (NOTE) The Xpert SA Assay (FDA approved for NASAL specimens in patients 37 years of age and older), is one component of a comprehensive surveillance program. It is not intended to diagnose infection nor to guide or monitor treatment. Performed at [facility], [address] [place], [place] [address]       Radiology Studies: DG Abd Portable 2V Result Date: 09/29/2023 CLINICAL DATA:  Small-bowel obstruction EXAM: PORTABLE ABDOMEN - 2 VIEW COMPARISON:  09/28/2023 FINDINGS: Enteric tube remains coiled in the stomach. Numerous dilated small bowel loops are again seen measuring up to 4.3 cm in diameter, previously 5.1 cm. No gross free intraperitoneal air. IMPRESSION: Persistent small bowel obstruction, slightly improved. Electronically Signed   By: Duanne Guess D.O.   On: 09/29/2023 10:47      Scheduled Meds:  bisacodyl  10 mg Rectal Daily   Chlorhexidine Gluconate Cloth  6 each Topical Q0600   heparin  5,000 Units Subcutaneous Q8H   mupirocin ointment  1 Application Nasal BID   pantoprazole (PROTONIX) IV  40 mg Intravenous Q12H   Continuous Infusions:  lactated ringers 40 mL/hr at 09/30/23 0808     LOS: 5 days   Time spent: 25 minutes   Noralee Stain, DO [facility] 09/30/2023, 12:20 PM   Available via Epic secure chat 7am-7pm After these hours, please refer to coverage provider listed on [email or web address]

## 2023-10-01 ENCOUNTER — Other Ambulatory Visit: Payer: Self-pay | Admitting: *Deleted

## 2023-10-01 DIAGNOSIS — K56609 Unspecified intestinal obstruction, unspecified as to partial versus complete obstruction: Secondary | ICD-10-CM | POA: Diagnosis not present

## 2023-10-01 LAB — CBC
HCT: 37.3 % — ABNORMAL LOW (ref 39.0–52.0)
Hemoglobin: 12.7 g/dL — ABNORMAL LOW (ref 13.0–17.0)
MCH: 30.1 pg (ref 26.0–34.0)
MCHC: 34 g/dL (ref 30.0–36.0)
MCV: 88.4 fL (ref 80.0–100.0)
Platelets: 244 10*3/uL (ref 150–400)
RBC: 4.22 MIL/uL (ref 4.22–5.81)
RDW: 14.6 % (ref 11.5–15.5)
WBC: 6.9 10*3/uL (ref 4.0–10.5)
nRBC: 0 % (ref 0.0–0.2)

## 2023-10-01 LAB — BASIC METABOLIC PANEL
Anion gap: 9 (ref 5–15)
BUN: 13 mg/dL (ref 8–23)
CO2: 23 mmol/L (ref 22–32)
Calcium: 8 mg/dL — ABNORMAL LOW (ref 8.9–10.3)
Chloride: 98 mmol/L (ref 98–111)
Creatinine, Ser: 1.12 mg/dL (ref 0.61–1.24)
GFR, Estimated: >60 mL/min (ref >60)
Glucose, Bld: 68 mg/dL — ABNORMAL LOW (ref 70–99)
Potassium: 3.4 mmol/L — ABNORMAL LOW (ref 3.5–5.1)
Sodium: 130 mmol/L — ABNORMAL LOW (ref 135–145)

## 2023-10-01 LAB — SURGICAL PATHOLOGY

## 2023-10-01 MED ORDER — KCL IN DEXTROSE-NACL 40-5-0.45 MEQ/L-%-% IV SOLN
INTRAVENOUS | Status: DC
  Filled 2023-10-01: qty 1000

## 2023-10-01 NOTE — Progress Notes (Signed)
 PROGRESS NOTE    Antonio Serrano  FMW:[id number] DOB: 23-Feb-1946 DOA: 09/25/2023 PCP: Candise Aleene DEL, MD     Brief Narrative:  Antonio Serrano is a 77 y.o. male with medical history significant of no reported intra-abdominal surgery.  Patient was in his usual state of health till approximately day before yesterday when he reports some mild onset of crampy abdominal pain and more than 10 episodes of vomiting. Patient has been unable to tolerate p.o. diet however because of nausea.  In the ER patient's CAT scan is concerning for small bowel obstruction, patient has had nasogastric tube placed with about 850 cc of dark brown/feculent aspiration thus far.  General surgery was consulted.  Due to lack of improvement, patient underwent diagnostic laparoscopy with lysis of adhesion, laparoscopic appendectomy by Dr. Sheldon on 12/29.  New events last 24 hours / Subjective: States that he is starting to pass gas, no bowel movement yet.  NG tube has been clamped this morning.  Admits to hunger.  Assessment & Plan:   Principal Problem:   SBO (small bowel obstruction) (HCC) Active Problems:   Chronic renal insufficiency, stage III (moderate) (HCC)   Type 2 diabetes mellitus with stage 3a chronic kidney disease, without long-term current use of insulin  (HCC)   Constipation, chronic   Hyponatremia   Leukocytosis   AKI (acute kidney injury) (HCC)   Hypokalemia   MRSA (methicillin resistant Staphylococcus aureus) colonization  SBO -Status post laparoscopic lysis of adhesion, laparoscopic appendectomy 12/29 -NG tube in place, clamp trial today -General Surgery following -Remains n.p.o. -IV fluids while n.p.o.  AKI -Baseline creatinine 1.3 -Resolved with IV fluid  MRSA in urine -Urine culture from 12/25 showed MRSA. Patient has no urinary symptoms, fever, blood cultures are negative. No systemic signs of infection. Repeat UA on 12/27 is negative. Discussed with ID on call. In setting of no systemic  signs and symptoms, this is likely a contaminant. Monitor.   Hypokalemia -Replace  Holding home oral medications including Norvasc , Plavix , Myrbetriq     DVT prophylaxis:  heparin  injection 5,000 Units Start: 09/26/23 0600 SCDs Start: 09/25/23 2127  Code Status: Full code Family Communication: None at bedside this afternoon Disposition Plan: Home Status is: Inpatient Remains inpatient appropriate because: Postop course    Antimicrobials:  Anti-infectives (From admission, onward)    Start     Dose/Rate Serrano Frequency Ordered Stop   09/29/23 2200  cefoTEtan  (CEFOTAN ) 2 g in sodium chloride  0.9 % 100 mL IVPB        2 g 200 mL/hr over 30 Minutes Intravenous Every 12 hours 09/29/23 1313 09/30/23 2033   09/29/23 1215  cefoTEtan  (CEFOTAN ) 2 g in sodium chloride  0.9 % 100 mL IVPB        2 g 200 mL/hr over 30 Minutes Intravenous On call to O.R. 09/29/23 0759 09/30/23 2033   09/29/23 1200  vancomycin  (VANCOCIN ) IVPB 1000 mg/200 mL premix       Note to Pharmacy: Pharmacy may adjust dosing strength, interval, or rate of medication as needed for optimal therapy for the patient  Send with patient on call to the OR.  Anesthesia to complete antibiotic administration <104min prior to incision per [facility].   1,000 mg 200 mL/hr over 60 Minutes Intravenous On call to O.R. 09/29/23 0748 09/29/23 1055   09/29/23 0923  sodium chloride  0.9 % with cefoTEtan  (CEFOTAN ) ADS Med       Note to Pharmacy: Victory Roselind HERO: cabinet override      09/29/23 [id number]  09/29/23 1028   09/29/23 0923  vancomycin  (VANCOCIN ) 1-5 GM/200ML-% IVPB       Note to Pharmacy: Victory Meth M: cabinet override      09/29/23 0923 09/29/23 1028   09/25/23 2200  metroNIDAZOLE  (FLAGYL ) IVPB 500 mg  Status:  Discontinued        500 mg 100 mL/hr over 60 Minutes Intravenous Every 12 hours 09/25/23 2059 09/26/23 1142   09/25/23 2115  cefTRIAXone  (ROCEPHIN ) 2 g in sodium chloride  0.9 % 100 mL IVPB  Status:  Discontinued        2  g 200 mL/hr over 30 Minutes Intravenous Every 24 hours 09/25/23 2059 09/26/23 1142        Objective: Vitals:   09/30/23 1725 09/30/23 2041 10/01/23 0605 10/01/23 1343  BP: (!) 160/88 (!) 152/87 134/81 (!) 153/81  Pulse: 86 77 78 70  Resp: 16 18 19 16   Temp: 97.9 F (36.6 C) 97.8 F (36.6 C) 97.9 F (36.6 C) 98.2 F (36.8 C)  TempSrc:  Oral Oral Oral  SpO2: 99% 96% 92% 98%  Weight:      Height:        Intake/Output Summary (Last 24 hours) at 10/01/2023 1414 Last data filed at 10/01/2023 1400 Gross per 24 hour  Intake 1014 ml  Output 1675 ml  Net -661 ml   Filed Weights   09/25/23 1516  Weight: 72.6 kg    Examination:  General exam: Appears calm and comfortable  Respiratory system: Clear to auscultation. Respiratory effort normal. No respiratory distress. No conversational dyspnea.  Cardiovascular system: S1 & S2 heard, RRR. No murmurs. No pedal edema. Gastrointestinal system: Abdomen is nondistended, soft, NGT in place, + BS heard Central nervous system: Alert and oriented. No focal neurological deficits. Speech clear.  Extremities: Symmetric in appearance  Skin: No rashes, lesions or ulcers on exposed skin  Psychiatry: Judgement and insight appear normal. Mood & affect appropriate.   Data Reviewed: I have personally reviewed following labs and imaging studies  CBC: Recent Labs  Lab 09/25/23 1530 09/25/23 2140 09/26/23 0444 09/29/23 0423 09/30/23 0406 10/01/23 0420  WBC 16.0* 10.3 6.9 6.6 8.8 6.9  NEUTROABS 13.9*  --   --   --   --   --   HGB 15.4 15.6 15.2 13.5 12.7* 12.7*  HCT 44.8 45.0 44.0 39.7 37.4* 37.3*  MCV 86.3 86.7 87.1 89.4 89.9 88.4  PLT 307 306 271 269 252 244   Basic Metabolic Panel: Recent Labs  Lab 09/25/23 2140 09/26/23 0444 09/27/23 0425 09/28/23 0515 09/29/23 0424 09/30/23 0406 10/01/23 0420  NA 127*   < > 132* 134* 134* 135 130*  K 4.6   < > 2.9* 3.0* 3.3* 4.7 3.4*  CL 96*   < > 86* 92* 97* 100 98  CO2 19*   < > 34* 29 27  27 23   GLUCOSE 132*   < > 158* 87 138* 173* 68*  BUN 47*   < > 25* 22 20 17 13   CREATININE 3.69*   < > 1.39* 1.43* 1.30* 1.48* 1.12  CALCIUM  8.6*   < > 8.2* 8.3* 8.5* 8.2* 8.0*  MG 2.0  --   --  2.0 1.9 1.9  --    < > = values in this interval not displayed.   GFR: Estimated Creatinine Clearance: 51.4 mL/min (by C-G formula based on SCr of 1.12 mg/dL). Liver Function Tests: Recent Labs  Lab 09/25/23 1711  AST 17  ALT 10  ALKPHOS  105  BILITOT 0.9  PROT 6.9  ALBUMIN 3.7   No results for input(s): LIPASE, AMYLASE in the last 168 hours. No results for input(s): AMMONIA in the last 168 hours. Coagulation Profile: Recent Labs  Lab 09/26/23 0444  INR 1.2   Cardiac Enzymes: No results for input(s): CKTOTAL, CKMB, CKMBINDEX, TROPONINI in the last 168 hours. BNP (last 3 results) No results for input(s): PROBNP in the last 8760 hours. HbA1C: No results for input(s): HGBA1C in the last 72 hours. CBG: Recent Labs  Lab 09/29/23 1156  GLUCAP 150*   Lipid Profile: No results for input(s): CHOL, HDL, LDLCALC, TRIG, CHOLHDL, LDLDIRECT in the last 72 hours. Thyroid  Function Tests: No results for input(s): TSH, T4TOTAL, FREET4, T3FREE, THYROIDAB in the last 72 hours. Anemia Panel: No results for input(s): VITAMINB12, FOLATE, FERRITIN, TIBC, IRON, RETICCTPCT in the last 72 hours. Sepsis Labs: Recent Labs  Lab 09/25/23 2202  LATICACIDVEN 2.0*    Recent Results (from the past 240 hours)  Urine Culture     Status: Abnormal   Collection Time: 09/25/23  7:36 PM   Specimen: Urine, Clean Catch  Result Value Ref Range Status   Specimen Description   Final    URINE, CLEAN CATCH Performed at [facility], [address], [place], [place] [address]    Special Requests   Final    NONE Performed at [facility], [address], [place], [place] [address]    Culture (A)  Final    >=100,000  COLONIES/mL METHICILLIN RESISTANT STAPHYLOCOCCUS AUREUS   Report Status 09/28/2023 FINAL  Final   Organism ID, Bacteria METHICILLIN RESISTANT STAPHYLOCOCCUS AUREUS (A)  Final      Susceptibility   Methicillin resistant staphylococcus aureus - MIC*    CIPROFLOXACIN  >=8 RESISTANT Resistant     GENTAMICIN <=0.5 SENSITIVE Sensitive     NITROFURANTOIN <=16 SENSITIVE Sensitive     OXACILLIN >=4 RESISTANT Resistant     TETRACYCLINE <=1 SENSITIVE Sensitive     VANCOMYCIN  <=0.5 SENSITIVE Sensitive     TRIMETH/SULFA <=10 SENSITIVE Sensitive     RIFAMPIN <=0.5 SENSITIVE Sensitive     Inducible Clindamycin NEGATIVE Sensitive     LINEZOLID 2 SENSITIVE Sensitive     * >=100,000 COLONIES/mL METHICILLIN RESISTANT STAPHYLOCOCCUS AUREUS  Culture, blood (Routine X 2) w Reflex to ID Panel     Status: None   Collection Time: 09/25/23  9:30 PM   Specimen: BLOOD RIGHT FOREARM  Result Value Ref Range Status   Specimen Description   Final    BLOOD RIGHT FOREARM Performed at [facility], [address], [place], [place] [address]    Special Requests   Final    BOTTLES DRAWN AEROBIC AND ANAEROBIC Blood Culture results may not be optimal due to an inadequate volume of blood received in culture bottles Performed at [facility], [address] [place], [place] [address]    Culture   Final    NO GROWTH 5 DAYS Performed at [facility], [address], [place], [place] [address]    Report Status 09/30/2023 FINAL  Final  Culture, blood (Routine X 2) w Reflex to ID Panel     Status: None   Collection Time: 09/25/23  9:40 PM   Specimen: BLOOD  Result Value Ref Range Status   Specimen Description   Final    BLOOD RIGHT ANTECUBITAL Performed at [facility], [address], [place], [place] [address]  Special Requests   Final    BOTTLES DRAWN AEROBIC AND ANAEROBIC Blood Culture adequate volume Performed at [facility], [address], [place], [place] [address]    Culture   Final    NO GROWTH 5 DAYS Performed at [facility], [address] [place], [place] [address]    Report Status 09/30/2023 FINAL  Final  Surgical pcr screen     Status: Abnormal   Collection Time: 09/29/23  8:19 AM   Specimen: Nasal Mucosa; Nasal Swab  Result Value Ref Range Status   MRSA, PCR POSITIVE (A) NEGATIVE Final   Staphylococcus aureus POSITIVE (A) NEGATIVE Final    Comment: (NOTE) The Xpert SA Assay (FDA approved for NASAL specimens in patients 74 years of age and older), is one component of a comprehensive surveillance program. It is not intended to diagnose infection nor to guide or monitor treatment. Performed at [facility], [address] [place], [place] [address]       Radiology Studies: No results found.     Scheduled Meds:  bisacodyl   10 mg Rectal Daily   Chlorhexidine  Gluconate Cloth  6 each Topical Q0600   heparin   5,000 Units Subcutaneous Q8H   mupirocin  ointment  1 Application Nasal BID   pantoprazole  (PROTONIX ) IV  40 mg Intravenous Q12H   Continuous Infusions:  dextrose  5 % and 0.45 % NaCl with KCl 40 mEq/L 40 mL/hr at 10/01/23 0901     LOS: 6 days   Time spent: 25 minutes   Delon Hoe, DO [facility] 10/01/2023, 2:14 PM   Available via Epic secure chat 7am-7pm After these hours, please refer to coverage provider listed on [email or web address]

## 2023-10-01 NOTE — Plan of Care (Signed)
   Problem: Clinical Measurements: Goal: Ability to maintain clinical measurements within normal limits will improve Outcome: Progressing   Problem: Coping: Goal: Level of anxiety will decrease Outcome: Progressing

## 2023-10-01 NOTE — Progress Notes (Signed)
 2 Days Post-Op lap LOA, appendectomy Subjective: Feels a bit better, no bowel function yet but passing flatus  Objective: Vital signs in last 24 hours: Temp:  [97.4 F (36.3 C)-97.9 F (36.6 C)] 97.9 F (36.6 C) (12/31 0605) Pulse Rate:  [71-90] 78 (12/31 0605) Resp:  [16-19] 19 (12/31 0605) BP: (134-160)/(66-88) 134/81 (12/31 0605) SpO2:  [92 %-99 %] 92 % (12/31 0605)   Intake/Output from previous day: 12/30 0701 - 12/31 0700 In: 1189.4 [P.O.:90; I.V.:1099.4] Out: 1150 [Urine:700; Emesis/NG output:450] Intake/Output this shift: No intake/output data recorded.   General appearance: alert and cooperative GI: soft, non-distended  Incision: no significant drainage  Lab Results:  Recent Labs    09/30/23 0406 10/01/23 0420  WBC 8.8 6.9  HGB 12.7* 12.7*  HCT 37.4* 37.3*  PLT 252 244   BMET Recent Labs    09/30/23 0406 10/01/23 0420  NA 135 130*  K 4.7 3.4*  CL 100 98  CO2 27 23  GLUCOSE 173* 68*  BUN 17 13  CREATININE 1.48* 1.12  CALCIUM  8.2* 8.0*   PT/INR No results for input(s): LABPROT, INR in the last 72 hours. ABG No results for input(s): PHART, HCO3 in the last 72 hours.  Invalid input(s): PCO2, PO2  MEDS, Scheduled  bisacodyl   10 mg Rectal Daily   Chlorhexidine  Gluconate Cloth  6 each Topical Q0600   heparin   5,000 Units Subcutaneous Q8H   mupirocin  ointment  1 Application Nasal BID   pantoprazole  (PROTONIX ) IV  40 mg Intravenous Q12H    Studies/Results: No results found.   Assessment: s/p Procedure(s): LAPAROSCOPY DIAGNOSTIC; BILATERAL TAP BLOCK; LAPAROSCOPIC APPENDECTOMY; LYSIS OF ADHESION Patient Active Problem List   Diagnosis Date Noted   MRSA (methicillin resistant Staphylococcus aureus) colonization 09/29/2023   Hypokalemia 09/28/2023   Leukocytosis 09/25/2023   AKI (acute kidney injury) (HCC) 09/25/2023   SBO (small bowel obstruction) (HCC) 09/25/2023   Esophageal dysphagia 06/16/2023   Gastritis and  gastroduodenitis 06/16/2023   Coffee ground emesis 06/16/2023   Acute blood loss anemia 06/15/2023   Upper GI bleed 06/14/2023   UTI (urinary tract infection) 04/19/2023   Thigh pain 04/19/2023   Vomiting 04/19/2023   TIA (transient ischemic attack) 01/04/2022   Hyponatremia    Closed right hip fracture, initial encounter (HCC) 04/23/2021   Diabetes mellitus with complication (HCC) 03/27/2016   Hyperlipidemia 07/29/2015   Conjunctivitis 06/27/2015   Healthcare maintenance 06/27/2015   Type 2 diabetes mellitus with stage 3a chronic kidney disease, without long-term current use of insulin  (HCC) 07/16/2014   Constipation, chronic 07/16/2014   Preventative health care 05/18/2014   Testicular pain, right 06/25/2013   Erectile dysfunction 05/31/2013   Chronic renal insufficiency, stage III (moderate) (HCC)    HTN (hypertension), benign 01/30/2012   Tobacco dependence 01/30/2012    Expected post op course  Plan: Cont NG until return of bowel function Will try clamping NG today   LOS: 6 days     .Bernarda JAYSON Ned, MD [facility], [place]    10/01/2023 8:19 AM

## 2023-10-01 NOTE — Evaluation (Signed)
 Occupational Therapy Evaluation Patient Details Name: Antonio Serrano MRN: [id number] DOB: 04-Mar-1946 Today's Date: 10/01/2023   History of Present Illness Patient is a 77 year old male who presented with mild crampy abdominal pain and more than 10 episodes of vomiting.  In the ER patient's CAT scan is concerning for small bowel obstruction, patient has had nasogastric tube placed. General surgery was consulted.  Due to lack of improvement, patient underwent diagnostic laparoscopy with lysis of adhesion, laparoscopic appendectomy by Dr. Sheldon on 12/29. PMH: TIA, HTN, THA, CKD III   Clinical Impression   Patient is a 77 year old male who was admitted for above. Patient was living at home with wife prior level. Currently, patient was able to progress to EOB with mod A to scoot to EOB, patient unable to transition into standing with patient stopping reporting he did not want to get nauseated and risk not getting NG tube out later today. (Patient currently in clamp trial). Patient was noted to have decreased functional activity tolerance, decreased endurance, decreased standing balance, decreased safety awareness, and decreased knowledge of AD/AE impacting participation in ADLs. Patient will benefit from continued inpatient follow up therapy, <3 hours/day. Patient would continue to benefit from skilled OT services at this time while admitted and after d/c to address noted deficits in order to improve overall safety and independence in ADLs.        If plan is discharge home, recommend the following: A lot of help with bathing/dressing/bathroom;Assistance with cooking/housework;Direct supervision/assist for medications management;Assist for transportation;Help with stairs or ramp for entrance;Direct supervision/assist for financial management    Functional Status Assessment  Patient has had a recent decline in their functional status and demonstrates the ability to make significant improvements in  function in a reasonable and predictable amount of time.  Equipment Recommendations  None recommended by OT       Precautions / Restrictions Precautions Precautions: Fall Precaution Comments: NGT Restrictions Weight Bearing Restrictions Per Provider Order: No      Mobility Bed Mobility Overal bed mobility: Needs Assistance Bed Mobility: Supine to Sit, Sit to Supine     Supine to sit: Mod assist Sit to supine: Contact guard assist   General bed mobility comments: incr time and effort, assist to elevate trunk       Balance Overall balance assessment: Needs assistance Sitting-balance support: Feet supported, No upper extremity supported Sitting balance-Leahy Scale: Fair         Standing balance comment: unable       ADL either performed or assessed with clinical judgement   ADL Overall ADL's : Needs assistance/impaired Eating/Feeding: NPO Eating/Feeding Details (indicate cue type and reason): NG clamped Grooming: Set up;Sitting   Upper Body Bathing: Set up;Sitting   Lower Body Bathing: Sitting/lateral leans;Maximal assistance   Upper Body Dressing : Set up;Sitting   Lower Body Dressing: Maximal assistance;Sitting/lateral leans Lower Body Dressing Details (indicate cue type and reason): able to cross legs but not into figure four position   Toilet Transfer Details (indicate cue type and reason): unable to progess to standing. patient motivated to get to chair at frist but them after getting to EOB with physical A as noted in bed mobility section, patient reported that he was not willing to mess up getting NG tbe out today. Toileting- Clothing Manipulation and Hygiene: Bed level;Total assistance               Vision   Vision Assessment?: No apparent visual deficits  Pertinent Vitals/Pain Pain Assessment Pain Assessment: No/denies pain     Extremity/Trunk Assessment Upper Extremity Assessment Upper Extremity Assessment: Overall WFL for  tasks assessed   Lower Extremity Assessment Lower Extremity Assessment: Defer to PT evaluation   Cervical / Trunk Assessment Cervical / Trunk Assessment: Normal   Communication Communication Communication: No apparent difficulties   Cognition Arousal: Alert Behavior During Therapy: WFL for tasks assessed/performed Overall Cognitive Status: Within Functional Limits for tasks assessed           General Comments: patient was agreable to participate, mildly irritable at times but cooperative                Home Living Family/patient expects to be discharged to:: Private residence Living Arrangements: Spouse/significant other Available Help at Discharge: Family Type of Home: House Home Access: Stairs to enter Secretary/administrator of Steps: 5 Entrance Stairs-Rails: Left;Right Home Layout: One level     Bathroom Shower/Tub: Arts Development Officer Toilet: Handicapped height     Home Equipment: Agricultural Consultant (2 wheels);Rollator (4 wheels)   Additional Comments: pt dtr-in-law assists with cooking and laundry, pt reports she visits them daily      Prior Functioning/Environment Prior Level of Function : Independent/Modified Independent             Mobility Comments: uses rollator, denies falls ADLs Comments: reports independence        OT Problem List: Decreased activity tolerance;Impaired balance (sitting and/or standing);Decreased coordination;Decreased safety awareness;Decreased knowledge of precautions;Decreased knowledge of use of DME or AE      OT Treatment/Interventions: Self-care/ADL training;Therapeutic exercise;DME and/or AE instruction;Therapeutic activities;Patient/family education;Balance training    OT Goals(Current goals can be found in the care plan section) Acute Rehab OT Goals Patient Stated Goal: to get NG out OT Goal Formulation: With patient Time For Goal Achievement: 10/15/23 Potential to Achieve Goals: Fair  OT Frequency: Min  1X/week       AM-PAC OT 6 Clicks Daily Activity     Outcome Measure Help from another person eating meals?: A Little Help from another person taking care of personal grooming?: Total (NPO) Help from another person toileting, which includes using toliet, bedpan, or urinal?: A Lot Help from another person bathing (including washing, rinsing, drying)?: A Lot Help from another person to put on and taking off regular upper body clothing?: A Little Help from another person to put on and taking off regular lower body clothing?: A Lot 6 Click Score: 13   End of Session Equipment Utilized During Treatment: Gait belt;Rolling walker (2 wheels)  Activity Tolerance: Patient tolerated treatment well Patient left: in bed;with call bell/phone within reach  OT Visit Diagnosis: Unsteadiness on feet (R26.81);Other abnormalities of gait and mobility (R26.89)                Time: 8849-8792 OT Time Calculation (min): 17 min Charges:  OT General Charges $OT Visit: 1 Visit OT Evaluation $OT Eval Low Complexity: 1 Low  Antonio Serrano OTR/L, MS Acute Rehabilitation Department Office# [phone]   Geofm CHRISTELLA Dance 10/01/2023, 12:57 PM

## 2023-10-01 NOTE — TOC Initial Note (Signed)
 Transition of Care [facility]) - Initial/Assessment Note   Patient Details  Name: Antonio Serrano MRN: [id number] Date of Birth: March 11, 1946  Transition of [facility]) CM/SW Contact:    Duwaine GORMAN Aran, LCSW Phone Number: 10/01/2023, 2:58 PM  Clinical Narrative: PT evaluation recommended HH. Patient is agreeable to HHPT/OT referral. Patient has used [facility] before and would like to use the same agency. CSW made [facility] referral to [facility] with [facility], which was accepted. Patient updated. HH orders requested.              Expected Discharge Plan: Home w Home Health Services Barriers to Discharge: Continued Medical Work up  Patient Goals and CMS Choice Patient states their goals for this hospitalization and ongoing recovery are:: Get [facility] through [facility] CMS Medicare.gov Compare Post Acute Care list provided to:: Patient Choice offered to / list presented to : Patient  Expected Discharge Plan and Services In-house Referral: Clinical Social Work Post Acute Care Choice: Home Health Living arrangements for the past 2 months: Mobile Home           DME Arranged: N/A DME Agency: NA HH Arranged: PT, OT Date HH Agency Contacted: 10/01/23 Time HH Agency Contacted: [phone] Representative spoke with at [facility] Agency: [facility]  Prior Living Arrangements/Services Living arrangements for the past 2 months: Mobile Home Lives with:: Spouse Patient language and need for interpreter reviewed:: Yes Do you feel safe going back to the place where you live?: Yes      Need for Family Participation in Patient Care: No (Comment) Care giver support system in place?: Yes (comment) Criminal Activity/Legal Involvement Pertinent to Current Situation/Hospitalization: No - Comment as needed  Activities of Daily Living ADL Screening (condition at time of admission) Independently performs ADLs?: No Does the patient have a NEW difficulty with bathing/dressing/toileting/self-feeding that is expected to last >3 days?: No Does the  patient have a NEW difficulty with getting in/out of bed, walking, or climbing stairs that is expected to last >3 days?: No Does the patient have a NEW difficulty with communication that is expected to last >3 days?: No Is the patient deaf or have difficulty hearing?: No Does the patient have difficulty seeing, even when wearing glasses/contacts?: No Does the patient have difficulty concentrating, remembering, or making decisions?: No  Permission Sought/Granted Permission sought to share information with : Other (comment) Permission granted to share information with : Yes, Verbal Permission Granted Permission granted to share info w AGENCY: [facility]  Emotional Assessment Attitude/Demeanor/Rapport: Engaged Affect (typically observed): Accepting Orientation: : Oriented to Self, Oriented to  Time, Oriented to Situation, Oriented to Place Alcohol  / Substance Use: Not Applicable Psych Involvement: No (comment)  Admission diagnosis:  Small bowel obstruction (HCC) [K56.609] SBO (small bowel obstruction) (HCC) [K56.609] AKI (acute kidney injury) (HCC) [N17.9] Patient Active Problem List   Diagnosis Date Noted   MRSA (methicillin resistant Staphylococcus aureus) colonization 09/29/2023   Hypokalemia 09/28/2023   Leukocytosis 09/25/2023   AKI (acute kidney injury) (HCC) 09/25/2023   SBO (small bowel obstruction) (HCC) 09/25/2023   Esophageal dysphagia 06/16/2023   Gastritis and gastroduodenitis 06/16/2023   Coffee ground emesis 06/16/2023   Acute blood loss anemia 06/15/2023   Upper GI bleed 06/14/2023   UTI (urinary tract infection) 04/19/2023   Thigh pain 04/19/2023   Vomiting 04/19/2023   TIA (transient ischemic attack) 01/04/2022   Hyponatremia    Closed right hip fracture, initial encounter (HCC) 04/23/2021   Diabetes mellitus with complication (HCC) 03/27/2016   Hyperlipidemia 07/29/2015   Conjunctivitis  06/27/2015   Healthcare maintenance 06/27/2015   Type 2 diabetes mellitus  with stage 3a chronic kidney disease, without long-term current use of insulin  (HCC) 07/16/2014   Constipation, chronic 07/16/2014   Preventative health care 05/18/2014   Testicular pain, right 06/25/2013   Erectile dysfunction 05/31/2013   Chronic renal insufficiency, stage III (moderate) (HCC)    HTN (hypertension), benign 01/30/2012   Tobacco dependence 01/30/2012   PCP:  Candise Aleene DEL, MD Pharmacy:   [facility] [address] ([address]), [place] - [address] [place] ([place]) [place] [address] Phone: [phone] Fax: [phone]  Social Drivers of Health (SDOH) Social History: SDOH Screenings   Food Insecurity: No Food Insecurity (09/25/2023)  Housing: Low Risk  (09/25/2023)  Transportation Needs: No Transportation Needs (09/25/2023)  Utilities: Not At Risk (09/25/2023)  Alcohol  Screen: Low Risk  (11/16/2020)  Depression (PHQ2-9): Medium Risk (07/03/2023)  Financial Resource Strain: Low Risk  (11/28/2022)  Physical Activity: Inactive (08/28/2023)  Social Connections: Moderately Isolated (10/01/2023)  Stress: No Stress Concern Present (11/28/2022)  Tobacco Use: High Risk (09/25/2023)  Health Literacy: Adequate Health Literacy (08/28/2023)   SDOH Interventions:    Readmission Risk Interventions    10/01/2023    2:54 PM 06/19/2023   10:18 AM 06/18/2023   10:27 AM  Readmission Risk Prevention Plan  Transportation Screening Complete Complete Complete  PCP or Specialist Appt within 5-7 Days  Complete Complete  Home Care Screening  Complete Complete  Medication Review (RN CM)  Complete Complete  HRI or Home Care Consult Complete    Social Work Consult for Recovery Care Planning/Counseling Complete    Palliative Care Screening Not Applicable    Medication Review Oceanographer) Complete

## 2023-10-02 DIAGNOSIS — K56609 Unspecified intestinal obstruction, unspecified as to partial versus complete obstruction: Secondary | ICD-10-CM

## 2023-10-02 HISTORY — DX: Unspecified intestinal obstruction, unspecified as to partial versus complete obstruction: K56.609

## 2023-10-02 LAB — BASIC METABOLIC PANEL
Anion gap: 8 (ref 5–15)
BUN: 11 mg/dL (ref 8–23)
CO2: 22 mmol/L (ref 22–32)
Calcium: 8.1 mg/dL — ABNORMAL LOW (ref 8.9–10.3)
Chloride: 102 mmol/L (ref 98–111)
Creatinine, Ser: 1.1 mg/dL (ref 0.61–1.24)
GFR, Estimated: >60 mL/min (ref >60)
Glucose, Bld: 115 mg/dL — ABNORMAL HIGH (ref 70–99)
Potassium: 3.8 mmol/L (ref 3.5–5.1)
Sodium: 132 mmol/L — ABNORMAL LOW (ref 135–145)

## 2023-10-02 LAB — MAGNESIUM: Magnesium: 1.7 mg/dL (ref 1.7–2.4)

## 2023-10-02 LAB — CBC
HCT: 37.4 % — ABNORMAL LOW (ref 39.0–52.0)
Hemoglobin: 12.9 g/dL — ABNORMAL LOW (ref 13.0–17.0)
MCH: 30.6 pg (ref 26.0–34.0)
MCHC: 34.5 g/dL (ref 30.0–36.0)
MCV: 88.6 fL (ref 80.0–100.0)
Platelets: 249 10*3/uL (ref 150–400)
RBC: 4.22 MIL/uL (ref 4.22–5.81)
RDW: 14.5 % (ref 11.5–15.5)
WBC: 7 10*3/uL (ref 4.0–10.5)
nRBC: 0 % (ref 0.0–0.2)

## 2023-10-02 MED ORDER — SODIUM CHLORIDE 0.9% FLUSH
3.0000 mL | Freq: Two times a day (BID) | INTRAVENOUS | Status: DC
  Administered 2023-10-02: 10 mL via INTRAVENOUS
  Administered 2023-10-02 – 2023-10-03 (×2): 3 mL via INTRAVENOUS
  Administered 2023-10-03: 10 mL via INTRAVENOUS
  Administered 2023-10-04 (×2): 3 mL via INTRAVENOUS
  Administered 2023-10-05: 10 mL via INTRAVENOUS
  Administered 2023-10-05 – 2023-10-06 (×2): 3 mL via INTRAVENOUS
  Administered 2023-10-06 – 2023-10-07 (×2): 10 mL via INTRAVENOUS
  Administered 2023-10-07 – 2023-10-08 (×2): 3 mL via INTRAVENOUS
  Administered 2023-10-09: 5 mL via INTRAVENOUS
  Administered 2023-10-09: 3 mL via INTRAVENOUS
  Administered 2023-10-10 (×2): 5 mL via INTRAVENOUS

## 2023-10-02 MED ORDER — SODIUM CHLORIDE 0.9% FLUSH: 3.0000 mL | INTRAVENOUS | Status: DC | PRN

## 2023-10-02 MED ORDER — HYDROCODONE-ACETAMINOPHEN 5-325 MG PO TABS
1.0000 | ORAL_TABLET | ORAL | Status: DC | PRN
  Administered 2023-10-02 – 2023-10-03 (×2): 1 via ORAL
  Filled 2023-10-02 (×2): qty 1

## 2023-10-02 NOTE — Plan of Care (Signed)
  Problem: Clinical Measurements: Goal: Will remain free from infection Outcome: Progressing   Problem: Skin Integrity: Goal: Risk for impaired skin integrity will decrease Outcome: Progressing   

## 2023-10-02 NOTE — Progress Notes (Signed)
 PROGRESS NOTE    Antonio Serrano  FMW:[id number] DOB: 1946-04-11 DOA: 09/25/2023 PCP: Candise Aleene DEL, MD     Brief Narrative:  Antonio Serrano is a 78 y.o. male with medical history significant of no reported intra-abdominal surgery, presented complaining of mild onset of crampy abdominal pain and more than 10 episodes of vomiting for few days prior to presentation. Patient has been unable to tolerate p.o. diet. In the ER patient's CT A/P was concerning for SBO. General surgery was consulted.  Due to lack of improvement, patient underwent diagnostic laparoscopy with lysis of adhesion, laparoscopic appendectomy by Dr. Sheldon on 12/29.  New events last 24 hours / Subjective: Reports some mild pain around laparoscopic sites, otherwise denies any new complaints.  Assessment & Plan:   Principal Problem:   SBO (small bowel obstruction) (HCC) Active Problems:   Chronic renal insufficiency, stage III (moderate) (HCC)   Type 2 diabetes mellitus with stage 3a chronic kidney disease, without long-term current use of insulin  (HCC)   Constipation, chronic   Hyponatremia   Leukocytosis   AKI (acute kidney injury) (HCC)   Hypokalemia   MRSA (methicillin resistant Staphylococcus aureus) colonization  SBO Status post laparoscopic lysis of adhesion, laparoscopic appendectomy on 12/29 Removed NG tube on 12/31, advance diet as tolerated General Surgery following  AKI Baseline creatinine 1.3 Resolved with IV fluid  MRSA in urine Urine culture from 12/25 showed MRSA. Patient has no urinary symptoms, fever, blood cultures are negative. No systemic signs of infection. Repeat UA on 12/27 is negative. Discussed with ID on call. In setting of no systemic signs and symptoms, this is likely a contaminant. Monitor.   Hypokalemia Replace prn  Holding home oral medications including Norvasc , Plavix , Myrbetriq , will check in with surgery if it is okay to restart home Plavix     DVT prophylaxis:  heparin   injection 5,000 Units Start: 09/26/23 0600 SCDs Start: 09/25/23 2127  Code Status: Full code Family Communication: None at bedside Disposition Plan: SNF Status is: Inpatient Remains inpatient appropriate because: Postop course    Antimicrobials:  Anti-infectives (From admission, onward)    Start     Dose/Rate Route Frequency Ordered Stop   09/29/23 2200  cefoTEtan  (CEFOTAN ) 2 g in sodium chloride  0.9 % 100 mL IVPB        2 g 200 mL/hr over 30 Minutes Intravenous Every 12 hours 09/29/23 1313 09/30/23 2033   09/29/23 1215  cefoTEtan  (CEFOTAN ) 2 g in sodium chloride  0.9 % 100 mL IVPB        2 g 200 mL/hr over 30 Minutes Intravenous On call to O.R. 09/29/23 0759 09/30/23 2033   09/29/23 1200  vancomycin  (VANCOCIN ) IVPB 1000 mg/200 mL premix       Note to Pharmacy: Pharmacy may adjust dosing strength, interval, or rate of medication as needed for optimal therapy for the patient  Send with patient on call to the OR.  Anesthesia to complete antibiotic administration <60min prior to incision per [facility].   1,000 mg 200 mL/hr over 60 Minutes Intravenous On call to O.R. 09/29/23 0748 09/29/23 1055   09/29/23 0923  sodium chloride  0.9 % with cefoTEtan  (CEFOTAN ) ADS Med       Note to Pharmacy: [facility]: cabinet override      09/29/23 0923 09/29/23 1028   09/29/23 0923  vancomycin  (VANCOCIN ) 1-5 GM/200ML-% IVPB       Note to Pharmacy: [facility]: cabinet override      09/29/23 0923 09/29/23 1028  09/25/23 2200  metroNIDAZOLE  (FLAGYL ) IVPB 500 mg  Status:  Discontinued        500 mg 100 mL/hr over 60 Minutes Intravenous Every 12 hours 09/25/23 2059 09/26/23 1142   09/25/23 2115  cefTRIAXone  (ROCEPHIN ) 2 g in sodium chloride  0.9 % 100 mL IVPB  Status:  Discontinued        2 g 200 mL/hr over 30 Minutes Intravenous Every 24 hours 09/25/23 2059 09/26/23 1142        Objective: Vitals:   10/01/23 1343 10/01/23 2127 10/02/23 0537 10/02/23 1437  BP: (!) 153/81 139/71 130/70  120/60  Pulse: 70 81 84 73  Resp: 16 17 17    Temp: 98.2 F (36.8 C) 98.2 F (36.8 C) 98.2 F (36.8 C) 98 F (36.7 C)  TempSrc: Oral Oral Oral Oral  SpO2: 98% 96% 95% 98%  Weight:      Height:        Intake/Output Summary (Last 24 hours) at 10/02/2023 1627 Last data filed at 10/02/2023 1400 Gross per 24 hour  Intake 1580 ml  Output 600 ml  Net 980 ml   Filed Weights   09/25/23 1516  Weight: 72.6 kg    Examination:  General: NAD  Cardiovascular: S1, S2 present Respiratory: CTAB Abdomen: Soft, +tender, nondistended, bowel sounds present Musculoskeletal: No bilateral pedal edema noted Skin: Normal Psychiatry: Normal mood   Data Reviewed: I have personally reviewed following labs and imaging studies  CBC: Recent Labs  Lab 09/26/23 0444 09/29/23 0423 09/30/23 0406 10/01/23 0420 10/02/23 0438  WBC 6.9 6.6 8.8 6.9 7.0  HGB 15.2 13.5 12.7* 12.7* 12.9*  HCT 44.0 39.7 37.4* 37.3* 37.4*  MCV 87.1 89.4 89.9 88.4 88.6  PLT 271 269 252 244 249   Basic Metabolic Panel: Recent Labs  Lab 09/25/23 2140 09/26/23 0444 09/28/23 0515 09/29/23 0424 09/30/23 0406 10/01/23 0420 10/02/23 0438  NA 127*   < > 134* 134* 135 130* 132*  K 4.6   < > 3.0* 3.3* 4.7 3.4* 3.8  CL 96*   < > 92* 97* 100 98 102  CO2 19*   < > 29 27 27 23 22   GLUCOSE 132*   < > 87 138* 173* 68* 115*  BUN 47*   < > 22 20 17 13 11   CREATININE 3.69*   < > 1.43* 1.30* 1.48* 1.12 1.10  CALCIUM  8.6*   < > 8.3* 8.5* 8.2* 8.0* 8.1*  MG 2.0  --  2.0 1.9 1.9  --  1.7   < > = values in this interval not displayed.   GFR: Estimated Creatinine Clearance: 52.4 mL/min (by C-G formula based on SCr of 1.1 mg/dL). Liver Function Tests: Recent Labs  Lab 09/25/23 1711  AST 17  ALT 10  ALKPHOS 105  BILITOT 0.9  PROT 6.9  ALBUMIN 3.7   No results for input(s): LIPASE, AMYLASE in the last 168 hours. No results for input(s): AMMONIA in the last 168 hours. Coagulation Profile: Recent Labs  Lab 09/26/23 0444   INR 1.2   Cardiac Enzymes: No results for input(s): CKTOTAL, CKMB, CKMBINDEX, TROPONINI in the last 168 hours. BNP (last 3 results) No results for input(s): PROBNP in the last 8760 hours. HbA1C: No results for input(s): HGBA1C in the last 72 hours. CBG: Recent Labs  Lab 09/29/23 1156  GLUCAP 150*   Lipid Profile: No results for input(s): CHOL, HDL, LDLCALC, TRIG, CHOLHDL, LDLDIRECT in the last 72 hours. Thyroid  Function Tests: No results for input(s): TSH,  T4TOTAL, FREET4, T3FREE, THYROIDAB in the last 72 hours. Anemia Panel: No results for input(s): VITAMINB12, FOLATE, FERRITIN, TIBC, IRON, RETICCTPCT in the last 72 hours. Sepsis Labs: Recent Labs  Lab 09/25/23 2202  LATICACIDVEN 2.0*    Recent Results (from the past 240 hours)  Urine Culture     Status: Abnormal   Collection Time: 09/25/23  7:36 PM   Specimen: Urine, Clean Catch  Result Value Ref Range Status   Specimen Description   Final    URINE, CLEAN CATCH Performed at [facility], [address] [place], [place] [address]    Special Requests   Final    NONE Performed at [facility], [address], [place], [place] [address]    Culture (A)  Final    >=100,000 COLONIES/mL METHICILLIN RESISTANT STAPHYLOCOCCUS AUREUS   Report Status 09/28/2023 FINAL  Final   Organism ID, Bacteria METHICILLIN RESISTANT STAPHYLOCOCCUS AUREUS (A)  Final      Susceptibility   Methicillin resistant staphylococcus aureus - MIC*    CIPROFLOXACIN  >=8 RESISTANT Resistant     GENTAMICIN <=0.5 SENSITIVE Sensitive     NITROFURANTOIN <=16 SENSITIVE Sensitive     OXACILLIN >=4 RESISTANT Resistant     TETRACYCLINE <=1 SENSITIVE Sensitive     VANCOMYCIN  <=0.5 SENSITIVE Sensitive     TRIMETH/SULFA <=10 SENSITIVE Sensitive     RIFAMPIN <=0.5 SENSITIVE Sensitive     Inducible Clindamycin NEGATIVE Sensitive     LINEZOLID 2 SENSITIVE Sensitive     *  >=100,000 COLONIES/mL METHICILLIN RESISTANT STAPHYLOCOCCUS AUREUS  Culture, blood (Routine X 2) w Reflex to ID Panel     Status: None   Collection Time: 09/25/23  9:30 PM   Specimen: BLOOD RIGHT FOREARM  Result Value Ref Range Status   Specimen Description   Final    BLOOD RIGHT FOREARM Performed at [facility], [address] [place], [place] [address]    Special Requests   Final    BOTTLES DRAWN AEROBIC AND ANAEROBIC Blood Culture results may not be optimal due to an inadequate volume of blood received in culture bottles Performed at [facility], [address], [place], [place] [address]    Culture   Final    NO GROWTH 5 DAYS Performed at [facility], [address], [place], [place] [address]    Report Status 09/30/2023 FINAL  Final  Culture, blood (Routine X 2) w Reflex to ID Panel     Status: None   Collection Time: 09/25/23  9:40 PM   Specimen: BLOOD  Result Value Ref Range Status   Specimen Description   Final    BLOOD RIGHT ANTECUBITAL Performed at [facility], [address], [place], [place] [address]    Special Requests   Final    BOTTLES DRAWN AEROBIC AND ANAEROBIC Blood Culture adequate volume Performed at [facility], [address] [place], [place] [address]    Culture   Final    NO GROWTH 5 DAYS Performed at [facility], [address], [place], [place] [address]    Report Status 09/30/2023 FINAL  Final  Surgical pcr screen     Status: Abnormal   Collection Time: 09/29/23  8:19 AM   Specimen: Nasal Mucosa; Nasal Swab  Result Value Ref Range Status   MRSA, PCR POSITIVE (A) NEGATIVE Final   Staphylococcus aureus POSITIVE (A) NEGATIVE Final    Comment: (NOTE) The Xpert SA Assay (  FDA approved for NASAL specimens in patients 66 years of age and older), is one component of a comprehensive surveillance program. It is not intended to diagnose infection nor to guide or  monitor treatment. Performed at [facility], [address], [place], [place] [address]       Radiology Studies: No results found.     Scheduled Meds:  bisacodyl   10 mg Rectal Daily   Chlorhexidine  Gluconate Cloth  6 each Topical Q0600   heparin   5,000 Units Subcutaneous Q8H   mupirocin  ointment  1 Application Nasal BID   pantoprazole  (PROTONIX ) IV  40 mg Intravenous Q12H   sodium chloride  flush  3-10 mL Intravenous Q12H   Continuous Infusions:     LOS: 7 days     Lebron JINNY Cage, MD [facility] 10/02/2023, 4:27 PM   Available via Epic secure chat 7am-7pm After these hours, please refer to coverage provider listed on [email or web address]

## 2023-10-02 NOTE — Progress Notes (Signed)
 Patient said he gets around with a walker at home. Patient's son, Jodie and daughter-in-Angie, said he does not walk at home. He transfers from a hospital bed to a recliner with help. Family members are worried about him going home. They are interested in possibly sending him to a SNF. PCP said he needs to see a neurologist about a possible Parkinson's diagnosis. Patient is telling family that he is going home tomorrow, 1/2.

## 2023-10-02 NOTE — Progress Notes (Signed)
 3 Days Post-Op lap LOA, appendectomy Subjective: Feels better, no bowel movement yet but passing flatus  Objective: Vital signs in last 24 hours: Temp:  [98.2 F (36.8 C)] 98.2 F (36.8 C) (01/01 0537) Pulse Rate:  [70-84] 84 (01/01 0537) Resp:  [16-17] 17 (01/01 0537) BP: (130-153)/(70-81) 130/70 (01/01 0537) SpO2:  [95 %-98 %] 95 % (01/01 0537)   Intake/Output from previous day: 12/31 0701 - 01/01 0700 In: 1059.3 [P.O.:220; I.V.:839.3] Out: 1225 [Urine:1175; Emesis/NG output:50] Intake/Output this shift: No intake/output data recorded.   General appearance: alert and cooperative GI: soft, non-distended  Incision: no significant drainage  Lab Results:  Recent Labs    10/01/23 0420 10/02/23 0438  WBC 6.9 7.0  HGB 12.7* 12.9*  HCT 37.3* 37.4*  PLT 244 249   BMET Recent Labs    10/01/23 0420 10/02/23 0438  NA 130* 132*  K 3.4* 3.8  CL 98 102  CO2 23 22  GLUCOSE 68* 115*  BUN 13 11  CREATININE 1.12 1.10  CALCIUM  8.0* 8.1*   PT/INR No results for input(s): LABPROT, INR in the last 72 hours. ABG No results for input(s): PHART, HCO3 in the last 72 hours.  Invalid input(s): PCO2, PO2  MEDS, Scheduled  bisacodyl   10 mg Rectal Daily   Chlorhexidine  Gluconate Cloth  6 each Topical Q0600   heparin   5,000 Units Subcutaneous Q8H   mupirocin  ointment  1 Application Nasal BID   pantoprazole  (PROTONIX ) IV  40 mg Intravenous Q12H   sodium chloride  flush  3-10 mL Intravenous Q12H    Studies/Results: No results found.   Assessment: s/p Procedure(s): LAPAROSCOPY DIAGNOSTIC; BILATERAL TAP BLOCK; LAPAROSCOPIC APPENDECTOMY; LYSIS OF ADHESION Patient Active Problem List   Diagnosis Date Noted   MRSA (methicillin resistant Staphylococcus aureus) colonization 09/29/2023   Hypokalemia 09/28/2023   Leukocytosis 09/25/2023   AKI (acute kidney injury) (HCC) 09/25/2023   SBO (small bowel obstruction) (HCC) 09/25/2023   Esophageal dysphagia 06/16/2023    Gastritis and gastroduodenitis 06/16/2023   Coffee ground emesis 06/16/2023   Acute blood loss anemia 06/15/2023   Upper GI bleed 06/14/2023   UTI (urinary tract infection) 04/19/2023   Thigh pain 04/19/2023   Vomiting 04/19/2023   TIA (transient ischemic attack) 01/04/2022   Hyponatremia    Closed right hip fracture, initial encounter (HCC) 04/23/2021   Diabetes mellitus with complication (HCC) 03/27/2016   Hyperlipidemia 07/29/2015   Conjunctivitis 06/27/2015   Healthcare maintenance 06/27/2015   Type 2 diabetes mellitus with stage 3a chronic kidney disease, without long-term current use of insulin  (HCC) 07/16/2014   Constipation, chronic 07/16/2014   Preventative health care 05/18/2014   Testicular pain, right 06/25/2013   Erectile dysfunction 05/31/2013   Chronic renal insufficiency, stage III (moderate) (HCC)    HTN (hypertension), benign 01/30/2012   Tobacco dependence 01/30/2012    Expected post op course  Plan: Advance diet as tolerated SL IVFs   LOS: 7 days     .Bernarda JAYSON Ned, MD [facility], [place]    10/02/2023 8:07 AM

## 2023-10-02 NOTE — Progress Notes (Signed)
 Physical Therapy Treatment Patient Details Name: Antonio Serrano MRN: [id number] DOB: 1946-08-10 Today's Date: 10/02/2023   History of Present Illness Patient is a 78 year old male who presented with mild crampy abdominal pain and more than 10 episodes of vomiting.  In the ER patient's CAT scan is concerning for small bowel obstruction, patient has had nasogastric tube placed. General surgery was consulted.  Due to lack of improvement, patient underwent diagnostic laparoscopy with lysis of adhesion, laparoscopic appendectomy by Dr. Sheldon on 12/29. PMH: TIA, HTN, THA, CKD III    PT Comments  Pt progressing slowly toward goals.  Worked on STS transitions from recliner this session,  pt total assist/unable to come to full stand to RW.  (per nursing staff pt was max-total assist of 2 for SPT bed to chair, not supporting his own wt at all) repeated STS/partial STS  x 4  with Stedy. pt mod assist for anterior - superior wt shift. assist to position LLE in neutral (pt tends to position L LE in external rotation and plantar flexion--states this leg is floppy normally) cues for self assist and hand placement Given extended hospital stay, generalized deconditioning patient may benefit from continued follow up therapy, <3 hours/day at d/c Pt currently declines SNF however pt would need max to total assist at home at current status, doubt pt wife and step dtr that checks in on him would be able to provide this level of assist. Continue PT POC    If plan is discharge home, recommend the following: A lot of help with walking and/or transfers;A lot of help with bathing/dressing/bathroom;Assist for transportation;Help with stairs or ramp for entrance;Assistance with cooking/housework   Can travel by private vehicle     No  Equipment Recommendations  None recommended by PT    Recommendations for Other Services       Precautions / Restrictions Precautions Precautions: Fall Restrictions Weight Bearing  Restrictions Per Provider Order: No     Mobility  Bed Mobility               General bed mobility comments: pt just  OOB with nursing staff    Transfers Overall transfer level: Needs assistance Equipment used: Rolling walker (2 wheels), Ambulation equipment used Transfers: Sit to/from Stand Sit to Stand: Max assist, Mod assist, Total assist           General transfer comment: repeated STS from recliner; pt total assist/unable to come to full stand to RW. (per nursing staff pt was max-total assist of 2 for SPT bed to chair, not supporting his own wt at all) repeated STS/partial STS  x 4 to and within [place]. pt mod assist for anterior - superior wt shift. assist to position LLE in neutral (pt tends to position L LE in external rotation and plantar flexion--states this leg is floppy normally) cues for self assist and hand placement Transfer via Lift Equipment: Stedy  Ambulation/Gait               General Gait Details: unable   Stairs             Wheelchair Mobility     Tilt Bed    Modified Rankin (Stroke Patients Only)       Balance     Sitting balance-Leahy Scale: Fair     Standing balance support: Bilateral upper extremity supported Standing balance-Leahy Scale: Zero  Cognition Arousal: Alert Behavior During Therapy: WFL for tasks assessed/performed Overall Cognitive Status: Within Functional Limits for tasks assessed                                          Exercises Other Exercises Other Exercises: gentle stretching R ankle d/t heelcord tight    General Comments        Pertinent Vitals/Pain Pain Assessment Pain Assessment: Faces Faces Pain Scale: Hurts a little bit Pain Location: L abd/chest Pain Descriptors / Indicators: Sore Pain Intervention(s): Limited activity within patient's tolerance, Monitored during session, Repositioned, Ice applied    Home Living                           Prior Function            PT Goals (current goals can now be found in the care plan section) Acute Rehab PT Goals PT Goal Formulation: With patient Time For Goal Achievement: 10/15/23 Potential to Achieve Goals: Good Progress towards PT goals: Progressing toward goals    Frequency    Min 1X/week      PT Plan      Co-evaluation              AM-PAC PT 6 Clicks Mobility   Outcome Measure  Help needed turning from your back to your side while in a flat bed without using bedrails?: A Little Help needed moving from lying on your back to sitting on the side of a flat bed without using bedrails?: A Little Help needed moving to and from a bed to a chair (including a wheelchair)?: Total Help needed standing up from a chair using your arms (e.g., wheelchair or bedside chair)?: Total Help needed to walk in hospital room?: Total Help needed climbing 3-5 steps with a railing? : Total 6 Click Score: 10    End of Session Equipment Utilized During Treatment: Gait belt;Other (comment) (stedy) Activity Tolerance: Patient limited by fatigue Patient left: in chair;with call bell/phone within reach Nurse Communication: Mobility status;Need for lift equipment PT Visit Diagnosis: Other abnormalities of gait and mobility (R26.89)     Time: 8961-8945 PT Time Calculation (min) (ACUTE ONLY): 16 min  Charges:    $Therapeutic Activity: 8-22 mins PT General Charges $$ ACUTE PT VISIT: 1 Visit                     Shauni Henner, PT  Acute Rehab Dept [facility]) [phone]  10/02/2023    [facility] 10/02/2023, 11:05 AM

## 2023-10-03 DIAGNOSIS — Z515 Encounter for palliative care: Secondary | ICD-10-CM

## 2023-10-03 DIAGNOSIS — K56609 Unspecified intestinal obstruction, unspecified as to partial versus complete obstruction: Principal | ICD-10-CM

## 2023-10-03 DIAGNOSIS — Z22322 Carrier or suspected carrier of Methicillin resistant Staphylococcus aureus: Secondary | ICD-10-CM | POA: Diagnosis not present

## 2023-10-03 DIAGNOSIS — Z7189 Other specified counseling: Secondary | ICD-10-CM

## 2023-10-03 DIAGNOSIS — R4589 Other symptoms and signs involving emotional state: Secondary | ICD-10-CM

## 2023-10-03 DIAGNOSIS — N179 Acute kidney failure, unspecified: Secondary | ICD-10-CM | POA: Diagnosis not present

## 2023-10-03 MED ORDER — CLOPIDOGREL BISULFATE 75 MG PO TABS
75.0000 mg | ORAL_TABLET | Freq: Every day | ORAL | Status: DC
  Administered 2023-10-03 – 2023-10-11 (×9): 75 mg via ORAL
  Filled 2023-10-03 (×10): qty 1

## 2023-10-03 MED ORDER — PANTOPRAZOLE SODIUM 40 MG PO TBEC
40.0000 mg | DELAYED_RELEASE_TABLET | Freq: Two times a day (BID) | ORAL | Status: DC
  Administered 2023-10-03 – 2023-10-11 (×18): 40 mg via ORAL
  Filled 2023-10-03 (×18): qty 1

## 2023-10-03 MED ORDER — HYDROCODONE-ACETAMINOPHEN 5-325 MG PO TABS: 1.0000 | ORAL_TABLET | ORAL | 0 refills | Status: DC | PRN

## 2023-10-03 MED ORDER — DEXTROSE 50 % IV SOLN
INTRAVENOUS | Status: AC
  Filled 2023-10-03: qty 50

## 2023-10-03 NOTE — [place] FL2 (Signed)
 [place]  MEDICAID FL2 LEVEL OF CARE FORM     IDENTIFICATION  Patient Name: Antonio Serrano Birthdate: 01-19-1946 Sex: male Admission Date (Current Location): 09/25/2023  [facility] and [place] Number:  Producer, Television/film/video and Address:  [facility],  [address] [place], [place] [address]      Provider Number: [id number]  Attending Physician Name and Address:  Donnamarie Lebron PARAS, MD  Relative Name and Phone Number:  Virginia  Babers (spouse) Ph: [phone]    Current Level of Care: Hospital Recommended Level of Care: Skilled Nursing Facility Prior Approval Number:    Date Approved/Denied:   PASRR Number: [id number]  Discharge Plan: SNF    Current Diagnoses: Patient Active Problem List   Diagnosis Date Noted   Need for emotional support 10/03/2023   Goals of care, counseling/discussion 10/03/2023   Small bowel obstruction (HCC) 10/03/2023   Palliative care encounter 10/03/2023   Counseling and coordination of care 10/03/2023   MRSA (methicillin resistant Staphylococcus aureus) colonization 09/29/2023   Hypokalemia 09/28/2023   Leukocytosis 09/25/2023   AKI (acute kidney injury) (HCC) 09/25/2023   SBO (small bowel obstruction) (HCC) 09/25/2023   Esophageal dysphagia 06/16/2023   Gastritis and gastroduodenitis 06/16/2023   Coffee ground emesis 06/16/2023   Acute blood loss anemia 06/15/2023   Upper GI bleed 06/14/2023   UTI (urinary tract infection) 04/19/2023   Thigh pain 04/19/2023   Vomiting 04/19/2023   TIA (transient ischemic attack) 01/04/2022   Hyponatremia    Closed right hip fracture, initial encounter (HCC) 04/23/2021   Diabetes mellitus with complication (HCC) 03/27/2016   Hyperlipidemia 07/29/2015   Conjunctivitis 06/27/2015   Healthcare maintenance 06/27/2015   Type 2 diabetes mellitus with stage 3a chronic kidney disease, without long-term current use of insulin  (HCC) 07/16/2014   Constipation, chronic 07/16/2014   Preventative health  care 05/18/2014   Testicular pain, right 06/25/2013   Erectile dysfunction 05/31/2013   Chronic renal insufficiency, stage III (moderate) (HCC)    HTN (hypertension), benign 01/30/2012   Tobacco dependence 01/30/2012    Orientation RESPIRATION BLADDER Height & Weight     Self, Time, Situation, Place  Normal Continent Weight: 160 lb (72.6 kg) Height:  5' 6 (167.6 cm)  BEHAVIORAL SYMPTOMS/MOOD NEUROLOGICAL BOWEL NUTRITION STATUS      Incontinent Diet (Soft diet)  AMBULATORY STATUS COMMUNICATION OF NEEDS Skin   Extensive Assist Verbally Skin abrasions, Other (Comment) (Ecchymosis: bilateral arms; Erythema: bilateral arms, sacrum; Abrasions: bilateral legs)                       Personal Care Assistance Level of Assistance  Bathing, Feeding, Dressing Bathing Assistance: Maximum assistance Feeding assistance: Independent Dressing Assistance: Maximum assistance     Functional Limitations Info  Sight, Hearing, Speech Sight Info: Impaired Hearing Info: Adequate Speech Info: Adequate    SPECIAL CARE FACTORS FREQUENCY  PT (By licensed PT), OT (By licensed OT)     PT Frequency: 5x's/week OT Frequency: 5x's/week            Contractures Contractures Info: Not present    Additional Factors Info  Code Status, Allergies Code Status Info: Full Allergies Info: Bactrim (Sulfamethoxazole -trimethoprim )           Current Medications (10/03/2023):  This is the current hospital active medication list Current Facility-Administered Medications  Medication Dose Route Frequency Provider Last Rate Last Admin   alum & mag hydroxide-simeth (MAALOX/MYLANTA) 200-200-20 MG/5ML suspension 30 mL  30 mL Oral Q6H PRN Sheldon Standing, MD  30 mL at 10/02/23 9379   bisacodyl  (DULCOLAX) suppository 10 mg  10 mg Rectal Daily Sheldon Standing, MD   10 mg at 10/02/23 9048   Chlorhexidine  Gluconate Cloth 2 % PADS 6 each  6 each Topical Q0600 Rojelio Nest, DO   6 each at 10/03/23 9479   clopidogrel   (PLAVIX ) tablet 75 mg  75 mg Oral Daily Ezenduka, Nkeiruka J, MD       dextrose  50 % solution            heparin  injection 5,000 Units  5,000 Units Subcutaneous Q8H Gross, Steven, MD   5,000 Units at 10/03/23 1440   HYDROcodone -acetaminophen  (NORCO/VICODIN) 5-325 MG per tablet 1-2 tablet  1-2 tablet Oral Q4H PRN Thomas, Alicia, MD   1 tablet at 10/03/23 0536   HYDROmorphone  (DILAUDID ) injection 0.5 mg  0.5 mg Intravenous Q2H PRN Sheldon Standing, MD       lidocaine  (XYLOCAINE ) 4 % external solution   Topical BID PRN Sheldon Standing, MD       lip balm (CARMEX) ointment   Topical PRN Sheldon Standing, MD   1 Application at 09/28/23 1830   magic mouthwash  15 mL Oral QID PRN Sheldon Standing, MD       menthol -cetylpyridinium (CEPACOL) lozenge 3 mg  1 lozenge Oral PRN Sheldon Standing, MD       mupirocin  ointment (BACTROBAN ) 2 % 1 Application  1 Application Nasal BID Rojelio Nest, DO   1 Application at 10/03/23 9041   naphazoline-glycerin  (CLEAR EYES REDNESS) ophth solution 1-2 drop  1-2 drop Both Eyes QID PRN Sheldon Standing, MD       pantoprazole  (PROTONIX ) EC tablet 40 mg  40 mg Oral BID Ezenduka, Nkeiruka J, MD   40 mg at 10/03/23 0958   phenol (CHLORASEPTIC) mouth spray 2 spray  2 spray Mouth/Throat PRN Sheldon Standing, MD       prochlorperazine  (COMPAZINE ) injection 5-10 mg  5-10 mg Intravenous Q4H PRN Sheldon Standing, MD       simethicone  (MYLICON) 40 MG/0.6ML suspension 80 mg  80 mg Oral QID PRN Sheldon Standing, MD       sodium chloride  (OCEAN) 0.65 % nasal spray 1-2 spray  1-2 spray Each Nare Q6H PRN Sheldon Standing, MD       sodium chloride  flush (NS) 0.9 % injection 3-10 mL  3-10 mL Intravenous PRN Sheldon Standing, MD       sodium chloride  flush (NS) 0.9 % injection 3-10 mL  3-10 mL Intravenous Q12H Debby Hila, MD   3 mL at 10/03/23 9041   sodium chloride  flush (NS) 0.9 % injection 3-10 mL  3-10 mL Intravenous PRN Thomas, Alicia, MD         Discharge Medications: Please see discharge summary for a list of  discharge medications.  Relevant Imaging Results:  Relevant Lab Results:   Additional Information SSN: [id number]  Duwaine GORMAN Aran, LCSW

## 2023-10-03 NOTE — Progress Notes (Signed)
 4 Days Post-Op lap LOA, appendectomy Subjective: Feels better, having BMs  Objective: Vital signs in last 24 hours: Temp:  [97.7 F (36.5 C)-98.1 F (36.7 C)] 98.1 F (36.7 C) (01/02 0519) Pulse Rate:  [73-88] 78 (01/02 0519) Resp:  [16-18] 18 (01/02 0519) BP: (115-122)/(60-71) 122/65 (01/02 0519) SpO2:  [96 %-98 %] 96 % (01/02 0519)   Intake/Output from previous day: 01/01 0701 - 01/02 0700 In: 1863 [P.O.:1860; I.V.:3] Out: 100 [Urine:100] Intake/Output this shift: No intake/output data recorded.   General appearance: alert and cooperative GI: soft, non-distended  Incision: no significant drainage  Lab Results:  Recent Labs    10/01/23 0420 10/02/23 0438  WBC 6.9 7.0  HGB 12.7* 12.9*  HCT 37.3* 37.4*  PLT 244 249   BMET Recent Labs    10/01/23 0420 10/02/23 0438  NA 130* 132*  K 3.4* 3.8  CL 98 102  CO2 23 22  GLUCOSE 68* 115*  BUN 13 11  CREATININE 1.12 1.10  CALCIUM  8.0* 8.1*   PT/INR No results for input(s): LABPROT, INR in the last 72 hours. ABG No results for input(s): PHART, HCO3 in the last 72 hours.  Invalid input(s): PCO2, PO2  MEDS, Scheduled  bisacodyl   10 mg Rectal Daily   Chlorhexidine  Gluconate Cloth  6 each Topical Q0600   dextrose        heparin   5,000 Units Subcutaneous Q8H   mupirocin  ointment  1 Application Nasal BID   pantoprazole  (PROTONIX ) IV  40 mg Intravenous Q12H   sodium chloride  flush  3-10 mL Intravenous Q12H    Studies/Results: No results found.   Assessment: s/p Procedure(s): LAPAROSCOPY DIAGNOSTIC; BILATERAL TAP BLOCK; LAPAROSCOPIC APPENDECTOMY; LYSIS OF ADHESION Patient Active Problem List   Diagnosis Date Noted   MRSA (methicillin resistant Staphylococcus aureus) colonization 09/29/2023   Hypokalemia 09/28/2023   Leukocytosis 09/25/2023   AKI (acute kidney injury) (HCC) 09/25/2023   SBO (small bowel obstruction) (HCC) 09/25/2023   Esophageal dysphagia 06/16/2023   Gastritis and  gastroduodenitis 06/16/2023   Coffee ground emesis 06/16/2023   Acute blood loss anemia 06/15/2023   Upper GI bleed 06/14/2023   UTI (urinary tract infection) 04/19/2023   Thigh pain 04/19/2023   Vomiting 04/19/2023   TIA (transient ischemic attack) 01/04/2022   Hyponatremia    Closed right hip fracture, initial encounter (HCC) 04/23/2021   Diabetes mellitus with complication (HCC) 03/27/2016   Hyperlipidemia 07/29/2015   Conjunctivitis 06/27/2015   Healthcare maintenance 06/27/2015   Type 2 diabetes mellitus with stage 3a chronic kidney disease, without long-term current use of insulin  (HCC) 07/16/2014   Constipation, chronic 07/16/2014   Preventative health care 05/18/2014   Testicular pain, right 06/25/2013   Erectile dysfunction 05/31/2013   Chronic renal insufficiency, stage III (moderate) (HCC)    HTN (hypertension), benign 01/30/2012   Tobacco dependence 01/30/2012    Expected post op course  Plan: Ok for d/c from surgical standpoint. Discharge instructions and f/u on chart   LOS: 8 days     .Bernarda JAYSON Ned, MD [facility], [place]    10/03/2023 8:42 AM

## 2023-10-03 NOTE — TOC Progression Note (Signed)
 Transition of Care [facility]) - Progression Note   Patient Details  Name: Antonio Serrano MRN: [id number] Date of Birth: [birth year]  [facility]) CM/SW Contact  Duwaine GORMAN Aran, LCSW Phone Number: 10/03/2023, 3:34 PM  Clinical Narrative: PT evaluation has been updated to SNF. Patient is agreeable to SNF and palliative recommending OP palliative. Patient agreeable to referral to [facility]. CSW made OP palliative referral to Shawn with [facility]. FL2 done; PASRR confirmed. Initial referral faxed out. TOC awaiting bed offers.  Expected Discharge Plan: Skilled Nursing Facility Barriers to Discharge: English As A Second Language Teacher, SNF Pending bed offer  Expected Discharge Plan and Services In-house Referral: Clinical Social Work Post Acute Care Choice: Home Health Living arrangements for the past 2 months: Mobile Home            DME Arranged: N/A DME Agency: NA HH Arranged: PT, OT Date HH Agency Contacted: 10/01/23 Time HH Agency Contacted: 1445 Representative spoke with at [facility] Agency: [place]  Social Determinants of Health (SDOH) Interventions SDOH Screenings   Food Insecurity: No Food Insecurity (09/25/2023)  Housing: Low Risk  (09/25/2023)  Transportation Needs: No Transportation Needs (09/25/2023)  Utilities: Not At Risk (09/25/2023)  Alcohol  Screen: Low Risk  (11/16/2020)  Depression (PHQ2-9): Medium Risk (07/03/2023)  Financial Resource Strain: Low Risk  (11/28/2022)  Physical Activity: Inactive (08/28/2023)  Social Connections: Moderately Isolated (10/01/2023)  Stress: No Stress Concern Present (11/28/2022)  Tobacco Use: High Risk (09/25/2023)  Health Literacy: Adequate Health Literacy (08/28/2023)   Readmission Risk Interventions    10/01/2023    2:54 PM 06/19/2023   10:18 AM 06/18/2023   10:27 AM  Readmission Risk Prevention Plan  Transportation Screening Complete Complete Complete  PCP or Specialist Appt within 5-7 Days  Complete Complete  Home Care Screening  Complete  Complete  Medication Review (RN CM)  Complete Complete  HRI or Home Care Consult Complete    Social Work Consult for Recovery Care Planning/Counseling Complete    Palliative Care Screening Not Applicable    Medication Review Oceanographer) Complete

## 2023-10-03 NOTE — Progress Notes (Signed)
 PROGRESS NOTE    Antonio Serrano  FMW:[id number] DOB: [birth year] DOA: 09/25/2023 PCP: Candise Aleene DEL, MD     Brief Narrative:  Antonio Serrano is a 78 y.o. male with medical history significant of no reported intra-abdominal surgery, presented complaining of mild onset of crampy abdominal pain and more than 10 episodes of vomiting for few days prior to presentation. Patient has been unable to tolerate p.o. diet. In the ER patient's CT A/P was concerning for SBO. General surgery was consulted.  Due to lack of improvement, patient underwent diagnostic laparoscopy with lysis of adhesion, laparoscopic appendectomy by Dr. Sheldon on 12/29.   New events last 24 hours / Subjective: Denies any new complaints today.  Patient was able to ambulate short distance with PT. recommending SNF upon discharge.    Assessment & Plan:   Principal Problem:   SBO (small bowel obstruction) (HCC) Active Problems:   Chronic renal insufficiency, stage III (moderate) (HCC)   Type 2 diabetes mellitus with stage 3a chronic kidney disease, without long-term current use of insulin  (HCC)   Constipation, chronic   Hyponatremia   Leukocytosis   AKI (acute kidney injury) (HCC)   Hypokalemia   MRSA (methicillin resistant Staphylococcus aureus) colonization   Need for emotional support   Goals of care, counseling/discussion   Small bowel obstruction [facility])   Palliative care encounter   Counseling and coordination of care  SBO Status post laparoscopic lysis of adhesion, laparoscopic appendectomy on 12/29 Removed NG tube on 12/31, advance diet as tolerated General Surgery signed off on 10/03/2023  AKI Baseline creatinine 1.3 Resolved with IV fluid  MRSA in urine Urine culture from 12/25 showed MRSA. Patient has no urinary symptoms, fever, blood cultures are negative. No systemic signs of infection. Repeat UA on 12/27 is negative. Discussed with ID on call. In setting of no systemic signs and symptoms, this is likely  a contaminant. Monitor.   Hypokalemia Replace prn  Prior history of CVA Continue Plavix   Hypertension BP stable, continue to hold Norvasc   Goals of care discussion Palliative consult as requested by family Difficult family dynamics noted after extensively discussing with patient's son and daughter-in-law on 10/02/2023.  Reported patient mostly spends time in his recliner/bed, refusing to ambulate, go to appointments with poor oral intake.  Have been working with his PCP to establish care for patient in a nursing home as they are unable to continue to take care of him.  Family had multiple complaints including patient's inability to ambulate and a questionable diagnosis of Parkinson's disease of which patient had been referred to neurology outpatient, but refuses to go for any appointments. Appreciate palliative recs Patient is noted to have capacity, to make informed decisions.      DVT prophylaxis:  heparin  injection 5,000 Units Start: 09/26/23 0600 SCDs Start: 09/25/23 2127  Code Status: Full code Family Communication: Discussed extensively with son and daughter-in-law on 10/02/2023 over the phone Disposition Plan: SNF Status is: Inpatient Remains inpatient appropriate because: Level of care    Antimicrobials:  Anti-infectives (From admission, onward)    Start     Dose/Rate Route Frequency Ordered Stop   09/29/23 2200  cefoTEtan  (CEFOTAN ) 2 g in sodium chloride  0.9 % 100 mL IVPB        2 g 200 mL/hr over 30 Minutes Intravenous Every 12 hours 09/29/23 1313 09/30/23 2033   09/29/23 1215  cefoTEtan  (CEFOTAN ) 2 g in sodium chloride  0.9 % 100 mL IVPB        2 g  200 mL/hr over 30 Minutes Intravenous On call to O.R. 09/29/23 0759 09/30/23 2033   09/29/23 1200  vancomycin  (VANCOCIN ) IVPB 1000 mg/200 mL premix       Note to Pharmacy: Pharmacy may adjust dosing strength, interval, or rate of medication as needed for optimal therapy for the patient  Send with patient on call to the  OR.  Anesthesia to complete antibiotic administration <44min prior to incision per [facility].   1,000 mg 200 mL/hr over 60 Minutes Intravenous On call to O.R. 09/29/23 0748 09/29/23 1055   09/29/23 0923  sodium chloride  0.9 % with cefoTEtan  (CEFOTAN ) ADS Med       Note to Pharmacy: [facility]: cabinet override      09/29/23 0923 09/29/23 1028   09/29/23 0923  vancomycin  (VANCOCIN ) 1-5 GM/200ML-% IVPB       Note to Pharmacy: [facility]: cabinet override      09/29/23 0923 09/29/23 1028   09/25/23 2200  metroNIDAZOLE  (FLAGYL ) IVPB 500 mg  Status:  Discontinued        500 mg 100 mL/hr over 60 Minutes Intravenous Every 12 hours 09/25/23 2059 09/26/23 1142   09/25/23 2115  cefTRIAXone  (ROCEPHIN ) 2 g in sodium chloride  0.9 % 100 mL IVPB  Status:  Discontinued        2 g 200 mL/hr over 30 Minutes Intravenous Every 24 hours 09/25/23 2059 09/26/23 1142        Objective: Vitals:   10/02/23 1437 10/02/23 2016 10/03/23 0519 10/03/23 1328  BP: 120/60 115/71 122/65 117/69  Pulse: 73 88 78 79  Resp:  16 18 18   Temp: 98 F (36.7 C) 97.7 F (36.5 C) 98.1 F (36.7 C) 98.3 F (36.8 C)  TempSrc: Oral Oral    SpO2: 98% 98% 96% 99%  Weight:      Height:        Intake/Output Summary (Last 24 hours) at 10/03/2023 1507 Last data filed at 10/03/2023 1328 Gross per 24 hour  Intake 1503 ml  Output 100 ml  Net 1403 ml   Filed Weights   09/25/23 1516  Weight: 72.6 kg    Examination:  General: NAD  Cardiovascular: S1, S2 present Respiratory: CTAB Abdomen: Soft, nontender, nondistended, bowel sounds present Musculoskeletal: No bilateral pedal edema noted Skin: Normal Psychiatry: Normal mood   Data Reviewed: I have personally reviewed following labs and imaging studies  CBC: Recent Labs  Lab 09/29/23 0423 09/30/23 0406 10/01/23 0420 10/02/23 0438  WBC 6.6 8.8 6.9 7.0  HGB 13.5 12.7* 12.7* 12.9*  HCT 39.7 37.4* 37.3* 37.4*  MCV 89.4 89.9 88.4 88.6  PLT 269 252 244 249    Basic Metabolic Panel: Recent Labs  Lab 09/28/23 0515 09/29/23 0424 09/30/23 0406 10/01/23 0420 10/02/23 0438  NA 134* 134* 135 130* 132*  K 3.0* 3.3* 4.7 3.4* 3.8  CL 92* 97* 100 98 102  CO2 29 27 27 23 22   GLUCOSE 87 138* 173* 68* 115*  BUN 22 20 17 13 11   CREATININE 1.43* 1.30* 1.48* 1.12 1.10  CALCIUM  8.3* 8.5* 8.2* 8.0* 8.1*  MG 2.0 1.9 1.9  --  1.7   GFR: Estimated Creatinine Clearance: 52.4 mL/min (by C-G formula based on SCr of 1.1 mg/dL). Liver Function Tests: No results for input(s): AST, ALT, ALKPHOS, BILITOT, PROT, ALBUMIN in the last 168 hours.  No results for input(s): LIPASE, AMYLASE in the last 168 hours. No results for input(s): AMMONIA in the last 168 hours. Coagulation Profile:  No results for input(s): INR, PROTIME in the last 168 hours.  Cardiac Enzymes: No results for input(s): CKTOTAL, CKMB, CKMBINDEX, TROPONINI in the last 168 hours. BNP (last 3 results) No results for input(s): PROBNP in the last 8760 hours. HbA1C: No results for input(s): HGBA1C in the last 72 hours. CBG: Recent Labs  Lab 09/29/23 1156  GLUCAP 150*   Lipid Profile: No results for input(s): CHOL, HDL, LDLCALC, TRIG, CHOLHDL, LDLDIRECT in the last 72 hours. Thyroid  Function Tests: No results for input(s): TSH, T4TOTAL, FREET4, T3FREE, THYROIDAB in the last 72 hours. Anemia Panel: No results for input(s): VITAMINB12, FOLATE, FERRITIN, TIBC, IRON, RETICCTPCT in the last 72 hours. Sepsis Labs: No results for input(s): PROCALCITON, LATICACIDVEN in the last 168 hours.   Recent Results (from the past 240 hours)  Urine Culture     Status: Abnormal   Collection Time: 09/25/23  7:36 PM   Specimen: Urine, Clean Catch  Result Value Ref Range Status   Specimen Description   Final    URINE, CLEAN CATCH Performed at [facility], [address], [place], [place] [address]    Special  Requests   Final    NONE Performed at [facility], [address], [place], [place] [address]    Culture (A)  Final    >=100,000 COLONIES/mL METHICILLIN RESISTANT STAPHYLOCOCCUS AUREUS   Report Status 09/28/2023 FINAL  Final   Organism ID, Bacteria METHICILLIN RESISTANT STAPHYLOCOCCUS AUREUS (A)  Final      Susceptibility   Methicillin resistant staphylococcus aureus - MIC*    CIPROFLOXACIN  >=8 RESISTANT Resistant     GENTAMICIN <=0.5 SENSITIVE Sensitive     NITROFURANTOIN <=16 SENSITIVE Sensitive     OXACILLIN >=4 RESISTANT Resistant     TETRACYCLINE <=1 SENSITIVE Sensitive     VANCOMYCIN  <=0.5 SENSITIVE Sensitive     TRIMETH/SULFA <=10 SENSITIVE Sensitive     RIFAMPIN <=0.5 SENSITIVE Sensitive     Inducible Clindamycin NEGATIVE Sensitive     LINEZOLID 2 SENSITIVE Sensitive     * >=100,000 COLONIES/mL METHICILLIN RESISTANT STAPHYLOCOCCUS AUREUS  Culture, blood (Routine X 2) w Reflex to ID Panel     Status: None   Collection Time: 09/25/23  9:30 PM   Specimen: BLOOD RIGHT FOREARM  Result Value Ref Range Status   Specimen Description   Final    BLOOD RIGHT FOREARM Performed at [facility], [address] [place], [place] [address]    Special Requests   Final    BOTTLES DRAWN AEROBIC AND ANAEROBIC Blood Culture results may not be optimal due to an inadequate volume of blood received in culture bottles Performed at [facility], [address] [place], [place] [address]    Culture   Final    NO GROWTH 5 DAYS Performed at [facility], [address], [place], [place] [address]    Report Status 09/30/2023 FINAL  Final  Culture, blood (Routine X 2) w Reflex to ID Panel     Status: None   Collection Time: 09/25/23  9:40 PM   Specimen: BLOOD  Result Value Ref Range Status   Specimen Description   Final    BLOOD RIGHT ANTECUBITAL Performed at [facility], [address] [place], [place]  [address]    Special Requests   Final    BOTTLES DRAWN AEROBIC AND ANAEROBIC Blood Culture adequate volume Performed at [facility], [address], [place], [place] [address]  Culture   Final    NO GROWTH 5 DAYS Performed at [facility], [address] [place], [place] [address]    Report Status 09/30/2023 FINAL  Final  Surgical pcr screen     Status: Abnormal   Collection Time: 09/29/23  8:19 AM   Specimen: Nasal Mucosa; Nasal Swab  Result Value Ref Range Status   MRSA, PCR POSITIVE (A) NEGATIVE Final   Staphylococcus aureus POSITIVE (A) NEGATIVE Final    Comment: (NOTE) The Xpert SA Assay (FDA approved for NASAL specimens in patients 16 years of age and older), is one component of a comprehensive surveillance program. It is not intended to diagnose infection nor to guide or monitor treatment. Performed at [facility], [address] [place], [place] [address]       Radiology Studies: No results found.     Scheduled Meds:  bisacodyl   10 mg Rectal Daily   Chlorhexidine  Gluconate Cloth  6 each Topical Q0600   dextrose        heparin   5,000 Units Subcutaneous Q8H   mupirocin  ointment  1 Application Nasal BID   pantoprazole   40 mg Oral BID   sodium chloride  flush  3-10 mL Intravenous Q12H   Continuous Infusions:     LOS: 8 days     Lebron JINNY Cage, MD [facility] 10/03/2023, 3:07 PM   Available via Epic secure chat 7am-7pm After these hours, please refer to coverage provider listed on [email or web address]

## 2023-10-03 NOTE — Plan of Care (Signed)
 Protective foam dressing applied on both heels and sacrum. Pt this morning asking if the foam dressings can be removed from his heels. Educated patient on importance of preventive measures to protect the skin. Pt also refusing off loading the heels on a pillow stating its too much Problem: Clinical Measurements: Goal: Ability to maintain clinical measurements within normal limits will improve Outcome: Progressing Goal: Will remain free from infection Outcome: Progressing Goal: Diagnostic test results will improve Outcome: Progressing   Problem: Activity: Goal: Risk for activity intolerance will decrease Outcome: Progressing   Problem: Elimination: Goal: Will not experience complications related to bowel motility Outcome: Progressing   Problem: Safety: Goal: Ability to remain free from injury will improve Outcome: Progressing

## 2023-10-03 NOTE — Consult Note (Signed)
 Consultation Note Date: 10/03/2023   Patient Name: Antonio Serrano  DOB: [birth year]  MRN: [id number]  Age / Sex: 78 y.o., male   PCP: Candise Aleene DEL, MD Referring Physician: Donnamarie Lebron PARAS, MD  Reason for Consultation: Establishing goals of care     Chief Complaint/History of Present Illness:   Patient is a 78 year old male with a past medical history of type 2 diabetes, chronic constipation, and MRSA colonization who was admitted on 09/25/2023 after presenting for cramping abdominal pain and more than 10 episodes of vomiting prior to presentation.  During hospitalization, imaging was concerning for SBO.  Patient initially underwent conservative management though symptoms did not improve.  Surgical team consulted and patient underwent laparoscopic lysis of adhesions and laparoscopic appendectomy on 09/29/2023.  Palliative medicine team consulted to assist with complex medical decision making.  Reviewed EMR prior to presenting to bedside.  Patient seen by surgery today and cleared for discharge due to improvement after surgical intervention.  Presented to bedside to meet with patient.  Patient seen sitting up in chair.  No family present at bedside.  Introduced myself as a member of the palliative medicine team and my role in patient's medical journey.  Patient discussed how he is feeling better today after working with PT.  He noted it felt great to be able to walk down the hall since he has been immobile for a long time.  Patient expressed hopes to be able to ambulate enough to transition from his recliner to bed at home as that is the extent of movement he does at home to watch his [employer] basketball games. Acknowledged this.   Inquired about family support and he noted he and his wife have children. He discussed how his wife has been trying to bankrupt them by putting him into a nursing facility though he does not want this since he has authority over his own finances. Acknowledged this.  Patient hopes to return home to enjoy his quality of life there.  Spent time providing emotional support as able via active listening. All questions answered at that time.   Updated hospitalist and RN regarding discussion.   Primary Diagnoses  Present on Admission:  Leukocytosis  Hyponatremia  AKI (acute kidney injury) (HCC)  SBO (small bowel obstruction) (HCC)  Chronic renal insufficiency, stage III (moderate) (HCC)  Type 2 diabetes mellitus with stage 3a chronic kidney disease, without long-term current use of insulin  (HCC)  Constipation, chronic   Past Medical History:  Diagnosis Date   Atherosclerosis of coronary artery    On chest CT->calcif in LAD and RCA   Cerebrovascular disease    03/2023 MRI brain showed chronic microvasc isch changes and small remote infarcts   Chronic low back pain    MR 11/2021 showed L4-5 foraminal stenosis--> plan per Ortho was to do L4-5 injection   Chronic prostatitis    Very mild elevation of PSA (>4), referred to [facility] where PSA repeat was 1.0 on 06/16/13.  Annual PSA repeat is all that is needed now per urologist.  Most recent 07/2015 was 0.45.   Chronic renal insufficiency, stage III (moderate) (HCC)    Baseline GFR as of 2019= 50s.     Closed right hip fracture (HCC) 03/2021   THA   Diabetes mellitus with complication (HCC) Dx'd fall 2011   Gastritis and gastroduodenitis 06/16/2023   HTN (hypertension)    Renal/aortic doppler u/s normal 06/2010   Hypercalcemia    Hyperlipidemia 2016   Statin intolerant--myalgias.  Pt refuses  any further trial of statin as of 2018.   Nonspecific abnormal electrocardiogram (ECG) (EKG) Fall 2011   TWI in inferior leads: myocardial perfusion scan neg and echo normal 07/2010   Pseudomonas aeruginosa colonization    12/2021 urine clx   TIA (transient ischemic attack)    12/2021- CT angio head/neck no high grade stenosis.  MR showed old lacunar infarcts corona radiata, basal ganglia, thalamus.  Echo normal.    Tobacco dependence    UTI (lower urinary tract infection)    Feb 2012 (klebsiella--dx at [facility]); 03/2013 e coli.    Social History   Socioeconomic History   Marital status: Married    Spouse name: Not on file   Number of children: Not on file   Years of education: Not on file   Highest education level: Not on file  Occupational History   Not on file  Tobacco Use   Smoking status: Every Day    Current packs/day: 0.25    Average packs/day: 0.3 packs/day for 50.0 years (12.5 ttl pk-yrs)    Types: Cigarettes   Smokeless tobacco: Never  Vaping Use   Vaping status: Never Used  Substance and Sexual Activity   Alcohol  use: Not Currently    Alcohol /week: 0.0 standard drinks of alcohol     Comment: rare alcohol    Drug use: No   Sexual activity: Not on file  Other Topics Concern   Not on file  Social History Narrative   Married, 4 grown children, 6 grandchildren.   Works in Production Designer, Theatre/television/film at [employer] in [place], [place].  Worked in [employer] all his life.   Lives in [place].   25 pack-yr tobacco hx--current as of 05/2014.   Drinks about a six pack per week.  Enjoys golf--six handicap at one point.  No formal exercise.   Walks a lot for his job.         Social Drivers of Corporate Investment Banker Strain: Low Risk  (11/28/2022)   Overall Financial Resource Strain (CARDIA)    Difficulty of Paying Living Expenses: Not hard at all  Food Insecurity: No Food Insecurity (09/25/2023)   Hunger Vital Sign    Worried About Running Out of Food in the Last Year: Never true    Ran Out of Food in the Last Year: Never true  Transportation Needs: No Transportation Needs (09/25/2023)   PRAPARE - Administrator, Civil Service (Medical): No    Lack of Transportation (Non-Medical): No  Physical Activity: Inactive (08/28/2023)   Exercise Vital Sign    Days of Exercise per Week: 0 days    Minutes of Exercise per Session: 0 min  Stress: No Stress Concern Present (11/28/2022)    [employer] of Occupational Health - Occupational Stress Questionnaire    Feeling of Stress : Not at all  Social Connections: Moderately Isolated (10/01/2023)   Social Connection and Isolation Panel [NHANES]    Frequency of Communication with Friends and Family: More than three times a week    Frequency of Social Gatherings with Friends and Family: More than three times a week    Attends Religious Services: Never    Database Administrator or Organizations: No    Attends Engineer, Structural: Never    Marital Status: Married   Family History  Problem Relation Age of Onset   Dementia Mother    Pneumonia Father        died in 55's of pneumonia, was otherwise healthy  Cancer Sister        colon cancer.  Died age 22   Diabetes Paternal Aunt    Diabetes Sister    Colon cancer Neg Hx    Scheduled Meds:  bisacodyl   10 mg Rectal Daily   Chlorhexidine  Gluconate Cloth  6 each Topical Q0600   dextrose        heparin   5,000 Units Subcutaneous Q8H   mupirocin  ointment  1 Application Nasal BID   pantoprazole   40 mg Oral BID   sodium chloride  flush  3-10 mL Intravenous Q12H   Continuous Infusions: PRN Meds:.alum & mag hydroxide-simeth, dextrose , HYDROcodone -acetaminophen , HYDROmorphone  (DILAUDID ) injection, lidocaine , lip balm, magic mouthwash, menthol -cetylpyridinium, naphazoline-glycerin , phenol, prochlorperazine , simethicone , sodium chloride , sodium chloride  flush, sodium chloride  flush Allergies  Allergen Reactions   Bactrim [Sulfamethoxazole -Trimethoprim ] Nausea Only   CBC:    Component Value Date/Time   WBC 7.0 10/02/2023 0438   HGB 12.9 (L) 10/02/2023 0438   HGB WILL FOLLOW 03/27/2018 1046   HCT 37.4 (L) 10/02/2023 0438   HCT WILL FOLLOW 03/27/2018 1046   PLT 249 10/02/2023 0438   PLT WILL FOLLOW 03/27/2018 1046   MCV 88.6 10/02/2023 0438   MCV WILL FOLLOW 03/27/2018 1046   NEUTROABS 13.9 (H) 09/25/2023 1530   NEUTROABS WILL FOLLOW 03/27/2018 1046   LYMPHSABS  1.2 09/25/2023 1530   LYMPHSABS WILL FOLLOW 03/27/2018 1046   MONOABS 0.8 09/25/2023 1530   EOSABS 0.0 09/25/2023 1530   EOSABS WILL FOLLOW 03/27/2018 1046   BASOSABS 0.0 09/25/2023 1530   BASOSABS WILL FOLLOW 03/27/2018 1046   Comprehensive Metabolic Panel:    Component Value Date/Time   NA 132 (L) 10/02/2023 0438   NA 126 (A) 02/17/2019 0000   K 3.8 10/02/2023 0438   CL 102 10/02/2023 0438   CO2 22 10/02/2023 0438   BUN 11 10/02/2023 0438   BUN 12 02/17/2019 0000   CREATININE 1.10 10/02/2023 0438   CREATININE 1.27 08/09/2023 1435   GLUCOSE 115 (H) 10/02/2023 0438   CALCIUM  8.1 (L) 10/02/2023 0438   AST 17 09/25/2023 1711   ALT 10 09/25/2023 1711   ALKPHOS 105 09/25/2023 1711   BILITOT 0.9 09/25/2023 1711   BILITOT 0.3 02/16/2019 0953   PROT 6.9 09/25/2023 1711   PROT 6.6 02/16/2019 0953   ALBUMIN 3.7 09/25/2023 1711   ALBUMIN 4.3 02/16/2019 0953    Physical Exam: Vital Signs: BP 117/69   Pulse 79   Temp 98.3 F (36.8 C)   Resp 18   Ht 5' 6 (1.676 m)   Wt 72.6 kg   SpO2 99%   BMI 25.82 kg/m  SpO2: SpO2: 99 % O2 Device: O2 Device: Room Air O2 Flow Rate: O2 Flow Rate (L/min): 0 L/min Intake/output summary:  Intake/Output Summary (Last 24 hours) at 10/03/2023 1345 Last data filed at 10/03/2023 1328 Gross per 24 hour  Intake 1983 ml  Output 100 ml  Net 1883 ml   LBM: Last BM Date : 10/02/23 Baseline Weight: Weight: 72.6 kg Most recent weight: Weight: 72.6 kg  General: NAD, alert, sitting in bedside chair, chronically ill appearing Cardiovascular: RRR Respiratory: no increased work of breathing noted, not in respiratory distress Skin: no rashes or lesions on visible skin Neuro: A&Ox4, following commands easily Psych: appropriately answers all questions          Palliative Performance Scale: 50%              Additional Data Reviewed: Recent Labs    10/01/23 0420 10/02/23 0438  WBC 6.9 7.0  HGB 12.7* 12.9*  PLT 244 249  NA 130* 132*  BUN 13 11   CREATININE 1.12 1.10    Imaging: DG Abd Portable 2V CLINICAL DATA:  Small-bowel obstruction  EXAM: PORTABLE ABDOMEN - 2 VIEW  COMPARISON:  09/28/2023  FINDINGS: Enteric tube remains coiled in the stomach. Numerous dilated small bowel loops are again seen measuring up to 4.3 cm in diameter, previously 5.1 cm. No gross free intraperitoneal air.  IMPRESSION: Persistent small bowel obstruction, slightly improved.  Electronically Signed   By: Mabel Converse D.O.   On: 09/29/2023 10:47    I personally reviewed recent imaging.   Palliative Care Assessment and Plan Summary of Established Goals of Care and Medical Treatment Preferences   Patient is a 78 year old male with a past medical history of type 2 diabetes, chronic constipation, and MRSA colonization who was admitted on 09/25/2023 after presenting for cramping abdominal pain and more than 10 episodes of vomiting prior to presentation.  During hospitalization, imaging was concerning for SBO.  Patient initially underwent conservative management though symptoms did not improve.  Surgical team consulted and patient underwent laparoscopic lysis of adhesions and laparoscopic appendectomy on 09/29/2023.  Palliative medicine team consulted to assist with complex medical decision making.  # Complex medical decision making/goals of care  -Discussed care with patient as detailed above in HPI. Patient can state that his best quality of life is when he is at home being able to move from his bed to his recliner to watch his [employer] basketball games. Patient does not want to enter long term care at this time and notes he has taken steps to prevent his family from doing this to him. Patient does not appear to have diagnosis of cognitive impairment noted in EMR. Should there be concerns about this from family, would need work up in the outpatient setting.   -Patient appears to be slowly improving from his surgical intervention with an acute life  limiting medical illness. If family would like palliative medicine follow up with this outpatient setting, would recommend involvement of TOC to coordinate.   -  Code Status: Full Code   # Discharge Planning:  To Be Determined  Thank you for allowing the palliative care team to participate in the care [facility].  Tinnie Radar, DO Palliative Care Provider PMT # [phone]  If patient remains symptomatic despite maximum doses, please call PMT at [phone] between 0700 and 1900. Outside of these hours, please call attending, as PMT does not have night coverage.  Personally spent 62 minutes in patient care including extensive chart review (labs, imaging, progress/consult notes, vital signs), medically appropraite exam, discussed with treatment team, education to patient, family, and staff, documenting clinical information, medication review and management, coordination of care, and available advanced directive documents.   *Please note that this is a verbal dictation therefore any spelling or grammatical errors are due to the Dragon Medical One system interpretation.

## 2023-10-03 NOTE — Progress Notes (Signed)
 Physical Therapy Treatment Patient Details Name: Antonio Serrano MRN: [id number] DOB: April 29, 1946 Today's Date: 10/03/2023   History of Present Illness Patient is a 78 year old male who presented with mild crampy abdominal pain and more than 10 episodes of vomiting.  In the ER patient's CAT scan is concerning for small bowel obstruction, patient has had nasogastric tube placed. General surgery was consulted.  Due to lack of improvement, patient underwent diagnostic laparoscopy with lysis of adhesion, laparoscopic appendectomy by Dr. Sheldon on 12/29. PMH: TIA, HTN, THA, CKD III    PT Comments  Pt very cooperative this am and with noted improvement in mobility including balance, decreased level of assist with some tasks and able to stand and ambulate short distance with RW.  Pt pleased with progress this am.    If plan is discharge home, recommend the following: A lot of help with walking and/or transfers;A lot of help with bathing/dressing/bathroom;Assist for transportation;Help with stairs or ramp for entrance;Assistance with cooking/housework   Can travel by private vehicle     No  Equipment Recommendations  None recommended by PT    Recommendations for Other Services       Precautions / Restrictions Precautions Precautions: Fall Restrictions Weight Bearing Restrictions Per Provider Order: No     Mobility  Bed Mobility Overal bed mobility: Needs Assistance Bed Mobility: Supine to Sit     Supine to sit: Mod assist     General bed mobility comments: INcreased time with use of rail and assist to bring trunk to upright; use of bed pad to complete rotation and scoot to EOB sitting    Transfers Overall transfer level: Needs assistance Equipment used: Rolling walker (2 wheels), Ambulation equipment used Transfers: Sit to/from Stand Sit to Stand: Mod assist, Max assist           General transfer comment: CUes for use of UEs to self assist; Physical assist to bring wt up and fwd  and to balance in intial standing    Ambulation/Gait Ambulation/Gait assistance: Min assist, Mod assist, +2 safety/equipment Gait Distance (Feet): 12 Feet Assistive device: Rolling walker (2 wheels) Gait Pattern/deviations: Step-to pattern, Step-through pattern, Decreased step length - right, Decreased step length - left, Shuffle, Trunk flexed, Scissoring, Narrow base of support Gait velocity: decr     General Gait Details: CUes for sequence, posture, to widen BOS and position from RW.  Short shuffling step with assist for balance and RW management   Stairs             Wheelchair Mobility     Tilt Bed    Modified Rankin (Stroke Patients Only)       Balance Overall balance assessment: Needs assistance Sitting-balance support: Feet supported, No upper extremity supported Sitting balance-Leahy Scale: Fair     Standing balance support: Bilateral upper extremity supported Standing balance-Leahy Scale: Poor                              Cognition Arousal: Alert Behavior During Therapy: WFL for tasks assessed/performed Overall Cognitive Status: Within Functional Limits for tasks assessed                                 General Comments: Pt very agreeable        Exercises      General Comments        Pertinent Vitals/Pain Pain  Assessment Pain Assessment: No/denies pain Pain Intervention(s): Limited activity within patient's tolerance, Monitored during session    Home Living                          Prior Function            PT Goals (current goals can now be found in the care plan section) Acute Rehab PT Goals PT Goal Formulation: With patient Time For Goal Achievement: 10/15/23 Potential to Achieve Goals: Good Progress towards PT goals: Progressing toward goals    Frequency    Min 1X/week      PT Plan      Co-evaluation              AM-PAC PT 6 Clicks Mobility   Outcome Measure  Help  needed turning from your back to your side while in a flat bed without using bedrails?: A Little Help needed moving from lying on your back to sitting on the side of a flat bed without using bedrails?: A Lot Help needed moving to and from a bed to a chair (including a wheelchair)?: A Lot Help needed standing up from a chair using your arms (e.g., wheelchair or bedside chair)?: A Lot Help needed to walk in hospital room?: A Lot Help needed climbing 3-5 steps with a railing? : Total 6 Click Score: 12    End of Session Equipment Utilized During Treatment: Gait belt;Other (comment) Activity Tolerance: Patient tolerated treatment well Patient left: in chair;with call bell/phone within reach Nurse Communication: Mobility status;Need for lift equipment PT Visit Diagnosis: Other abnormalities of gait and mobility (R26.89)     Time: 8895-8878 PT Time Calculation (min) (ACUTE ONLY): 17 min  Charges:    $Gait Training: 8-22 mins PT General Charges $$ ACUTE PT VISIT: 1 Visit                     Katrinka Acton PT Acute Rehabilitation Services Pager [phone] Office [phone]    Euclide Granito 10/03/2023, 12:36 PM

## 2023-10-04 DIAGNOSIS — K56609 Unspecified intestinal obstruction, unspecified as to partial versus complete obstruction: Secondary | ICD-10-CM | POA: Diagnosis not present

## 2023-10-04 MED ORDER — DOCUSATE SODIUM 100 MG PO CAPS
100.0000 mg | ORAL_CAPSULE | Freq: Two times a day (BID) | ORAL | Status: DC
  Administered 2023-10-04 (×2): 100 mg via ORAL
  Filled 2023-10-04 (×3): qty 1

## 2023-10-04 MED ORDER — POLYETHYLENE GLYCOL 3350 17 G PO PACK
17.0000 g | PACK | Freq: Two times a day (BID) | ORAL | Status: DC
  Administered 2023-10-04: 17 g via ORAL
  Filled 2023-10-04: qty 1

## 2023-10-04 MED ORDER — HYDROCODONE-ACETAMINOPHEN 5-325 MG PO TABS: 1.0000 | ORAL_TABLET | Freq: Four times a day (QID) | ORAL | Status: DC | PRN

## 2023-10-04 NOTE — Progress Notes (Signed)
 Physical Therapy Treatment Patient Details Name: Antonio Serrano MRN: [id number] DOB: [birth year] Today's Date: 10/04/2023   History of Present Illness Patient is a 78 year old male who presented with mild crampy abdominal pain and more than 10 episodes of vomiting.  In the ER patient's CAT scan is concerning for small bowel obstruction, patient has had nasogastric tube placed. General surgery was consulted.  Due to lack of improvement, patient underwent diagnostic laparoscopy with lysis of adhesion, laparoscopic appendectomy by Dr. Sheldon on 12/29. PMH: TIA, HTN, THA, CKD III    PT Comments  As promised, I came back to assist pt back to bed. General transfer comment: Pt required Max Assist back to bed from recliner due to increased c/o fatigue and present with severe posterior lean.  Also had to block B feet to prevent skiing.  Pt stated someone was bring his shoes for next time and for Rehab. General bed mobility comments: Pt required Max Assist + 2 back to bed and Total Asisst + 2 to scoot to [facility].  Then positioned to comfort.  Pt is rigid and kyphotic. Pt will need ST Rehab at SNF to address mobility and functional decline prior to safely returning home.     If plan is discharge home, recommend the following:     Can travel by private vehicle     No  Equipment Recommendations  None recommended by PT    Recommendations for Other Services       Precautions / Restrictions Precautions Precautions: Fall Precaution Comments: recent ABD surgery Restrictions Weight Bearing Restrictions Per Provider Order: No     Mobility  Bed Mobility Overal bed mobility: Needs Assistance Bed Mobility: Sit to Supine      Sit to supine: Max assist, +2 for physical assistance   General bed mobility comments: Pt required Max Assist + 2 back to bed and Total Asisst + 2 to scoot to [facility].  Then positioned to comfort.    Transfers Overall transfer level: Needs assistance Equipment used: Rolling  walker (2 wheels), Ambulation equipment used Transfers: Sit to/from Stand Sit to Stand: Max assist           General transfer comment: Pt required Max Assist back to bed from recliner due to increased c/o fatigue and present with severe posterior lean.  Also had to block B feet to prevent skiing.  Pt stated someone was bring his shoes for next time and for Rehab.    Ambulation/Gait             Wheelchair Mobility     Tilt Bed    Modified Rankin (Stroke Patients Only)       Balance                                            Cognition Arousal: Alert Behavior During Therapy: WFL for tasks assessed/performed Overall Cognitive Status: Within Functional Limits for tasks assessed                                 General Comments: AxO x 3 very pleasant        Exercises      General Comments        Pertinent Vitals/Pain Pain Assessment Pain Assessment: Faces Faces Pain Scale: Hurts a little bit Pain Location: ABD Pain Descriptors /  Indicators: Sore, Operative site guarding, Grimacing Pain Intervention(s): Monitored during session, Repositioned    Home Living                          Prior Function            PT Goals (current goals can now be found in the care plan section) Progress towards PT goals: Progressing toward goals    Frequency    Min 1X/week      PT Plan      Co-evaluation              AM-PAC PT 6 Clicks Mobility   Outcome Measure  Help needed turning from your back to your side while in a flat bed without using bedrails?: A Lot Help needed moving from lying on your back to sitting on the side of a flat bed without using bedrails?: A Lot Help needed moving to and from a bed to a chair (including a wheelchair)?: A Lot Help needed standing up from a chair using your arms (e.g., wheelchair or bedside chair)?: A Lot Help needed to walk in hospital room?: A Lot Help needed  climbing 3-5 steps with a railing? : Total 6 Click Score: 11    End of Session Equipment Utilized During Treatment: Other (comment) Activity Tolerance: Patient tolerated treatment well Patient left: in bed;with call bell/phone within reach;with bed alarm set Nurse Communication: Mobility status PT Visit Diagnosis: Other abnormalities of gait and mobility (R26.89)     Time: 8684-8674 PT Time Calculation (min) (ACUTE ONLY): 10 min  Charges:    $Gait Training: 8-22 mins $Therapeutic Activity: 8-22 mins PT General Charges $$ ACUTE PT VISIT: 1 Visit                     Katheryn Leap  PTA Acute  Rehabilitation Services Office M-F          [phone]

## 2023-10-04 NOTE — Progress Notes (Signed)
 Antonio Serrano [facility] Liaison Note:  Notified by [facility] manager of patient/family request for [facility] Palliative services at facilty after discharge.   Please call with any hospice or outpatient palliative care related questions.   Thank you for the opportunity to participate in this patient's care.   Elouise Husband, BSN, RN, OCN [employer] [phone]

## 2023-10-04 NOTE — Progress Notes (Signed)
 Physical Therapy Treatment Patient Details Name: Antonio Serrano MRN: [id number] DOB: [birth year] Today's Date: 10/04/2023   History of Present Illness Patient is a 78 year old male who presented with mild crampy abdominal pain and more than 10 episodes of vomiting.  In the ER patient's CAT scan is concerning for small bowel obstruction, patient has had nasogastric tube placed. General surgery was consulted.  Due to lack of improvement, patient underwent diagnostic laparoscopy with lysis of adhesion, laparoscopic appendectomy by Dr. Sheldon on 12/29. PMH: TIA, HTN, THA, CKD III    PT Comments  Pt AxO x 3 very pleasant and willing.   Assisted OOB was difficult due to recent ABD surgery.   General bed mobility comments: required increased asisst for upperbody and use of bed pad to complete scooting to EOB.  General transfer comment: Ces for use of UEs to self assist; Physical assist to bring wt up and fwd and to balance in intial standing.  B feet skiing  Instructed pt shoes would be better (non here). General Gait Details: Cues for sequence, posture, to widen BOS and position from RW.  Short shuffling step with assist for balance and RW management.  Would perform better with SHOES.  Recliner following for safety. Pt will need ST Rehab at SNF to address mobility and functional decline prior to safely returning home.    If plan is discharge home, recommend the following:     Can travel by private vehicle     No  Equipment Recommendations  None recommended by PT    Recommendations for Other Services       Precautions / Restrictions Precautions Precautions: Fall Precaution Comments: recent ABD surgery Restrictions Weight Bearing Restrictions Per Provider Order: No     Mobility  Bed Mobility Overal bed mobility: Needs Assistance Bed Mobility: Supine to Sit     Supine to sit: Mod assist, Max assist     General bed mobility comments: required increased asisst for upperbody and use of  bed pad to complete scooting to EOB    Transfers Overall transfer level: Needs assistance Equipment used: Rolling walker (2 wheels), Ambulation equipment used Transfers: Sit to/from Stand Sit to Stand: Mod assist, Max assist           General transfer comment: CUes for use of UEs to self assist; Physical assist to bring wt up and fwd and to balance in intial standing.  B feet skiing  Instructed pt shoes would be better (non here).    Ambulation/Gait Ambulation/Gait assistance: Min assist, Mod assist, +2 safety/equipment, Modified independent (Device/Increase time) Gait Distance (Feet): 12 Feet Assistive device: Rolling walker (2 wheels) Gait Pattern/deviations: Step-to pattern, Step-through pattern, Decreased step length - right, Decreased step length - left, Shuffle, Trunk flexed, Scissoring, Narrow base of support Gait velocity: decr     General Gait Details: CUes for sequence, posture, to widen BOS and position from RW.  Short shuffling step with assist for balance and RW management.  Would perform better with SHOES.   Stairs             Wheelchair Mobility     Tilt Bed    Modified Rankin (Stroke Patients Only)       Balance                                            Cognition Arousal: Alert Behavior  During Therapy: WFL for tasks assessed/performed Overall Cognitive Status: Within Functional Limits for tasks assessed                                 General Comments: AxO x 3 very pleasant        Exercises      General Comments        Pertinent Vitals/Pain Pain Assessment Pain Assessment: Faces Faces Pain Scale: Hurts a little bit Pain Location: ABD Pain Descriptors / Indicators: Sore, Operative site guarding, Grimacing Pain Intervention(s): Monitored during session, Repositioned    Home Living                          Prior Function            PT Goals (current goals can now be found in  the care plan section) Progress towards PT goals: Progressing toward goals    Frequency    Min 1X/week      PT Plan      Co-evaluation              AM-PAC PT 6 Clicks Mobility   Outcome Measure  Help needed turning from your back to your side while in a flat bed without using bedrails?: A Lot Help needed moving from lying on your back to sitting on the side of a flat bed without using bedrails?: A Lot Help needed moving to and from a bed to a chair (including a wheelchair)?: A Lot Help needed standing up from a chair using your arms (e.g., wheelchair or bedside chair)?: A Lot Help needed to walk in hospital room?: A Lot Help needed climbing 3-5 steps with a railing? : Total 6 Click Score: 11    End of Session Equipment Utilized During Treatment: Other (comment) Activity Tolerance: Patient tolerated treatment well Patient left: in chair;with call bell/phone within reach Nurse Communication: Mobility status PT Visit Diagnosis: Other abnormalities of gait and mobility (R26.89)     Time: 1020-1035 PT Time Calculation (min) (ACUTE ONLY): 15 min  Charges:    $Gait Training: 8-22 mins PT General Charges $$ ACUTE PT VISIT: 1 Visit                     Katheryn Leap  PTA Acute  Rehabilitation Services Office M-F          [phone]

## 2023-10-04 NOTE — Progress Notes (Signed)
 PROGRESS NOTE    Antonio Serrano  FMW:[id number] DOB: Sep 04, 1946 DOA: 09/25/2023 PCP: Candise Aleene DEL, MD     Brief Narrative:  Antonio Serrano is a 78 y.o. male with medical history significant of no reported intra-abdominal surgery, presented complaining of mild onset of crampy abdominal pain and more than 10 episodes of vomiting for few days prior to presentation. Patient has been unable to tolerate p.o. diet. In the ER patient's CT A/P was concerning for SBO. General surgery was consulted.  Due to lack of improvement, patient underwent diagnostic laparoscopy with lysis of adhesion, laparoscopic appendectomy by Dr. Sheldon on 12/29.   New events last 24 hours / Subjective: Today, patient denies any new complaints.  Appears to be progressing postop.  No BM in about 2 days.    Assessment & Plan:   Principal Problem:   SBO (small bowel obstruction) (HCC) Active Problems:   Chronic renal insufficiency, stage III (moderate) (HCC)   Type 2 diabetes mellitus with stage 3a chronic kidney disease, without long-term current use of insulin  (HCC)   Constipation, chronic   Hyponatremia   Leukocytosis   AKI (acute kidney injury) (HCC)   Hypokalemia   MRSA (methicillin resistant Staphylococcus aureus) colonization   Need for emotional support   Goals of care, counseling/discussion   Small bowel obstruction [facility])   Palliative care encounter   Counseling and coordination of care  SBO Status post laparoscopic lysis of adhesion, laparoscopic appendectomy on 12/29 Removed NG tube on 12/31, advanced diet \ General Surgery signed off on 10/04/2023  AKI Baseline creatinine 1.3 Resolved with IV fluid  MRSA in urine Urine culture from 12/25 showed MRSA. Patient has no urinary symptoms, fever, blood cultures are negative. No systemic signs of infection. Repeat UA on 12/27 is negative. Discussed with ID on call. In setting of no systemic signs and symptoms, this is likely a contaminant. Monitor.    Hypokalemia Replace prn  Prior history of CVA Continue Plavix   Hypertension BP stable, continue to hold Norvasc   Goals of care discussion Palliative consult as requested by family Difficult family dynamics noted after extensively discussing with patient's son and daughter-in-law on 10/02/2023.  Reported patient mostly spends time in his recliner/bed, refusing to ambulate, go to appointments with poor oral intake.  Have been working with his PCP to establish care for patient in a nursing home as they are unable to continue to take care of him.  Family had multiple complaints including patient's inability to ambulate and a questionable diagnosis of Parkinson's disease of which patient had been referred to neurology outpatient, but refuses to go for any appointments. Appreciate palliative recs Patient is noted to have capacity, to make informed decisions.      DVT prophylaxis:  heparin  injection 5,000 Units Start: 09/26/23 0600 SCDs Start: 09/25/23 2127  Code Status: Full code Family Communication: Discussed extensively with son and daughter-in-law on 10/02/2023 over the phone Disposition Plan: SNF Status is: Inpatient Remains inpatient appropriate because: Level of care    Antimicrobials:  Anti-infectives (From admission, onward)    Start     Dose/Rate Route Frequency Ordered Stop   09/29/23 2200  cefoTEtan  (CEFOTAN ) 2 g in sodium chloride  0.9 % 100 mL IVPB        2 g 200 mL/hr over 30 Minutes Intravenous Every 12 hours 09/29/23 1313 09/30/23 2033   09/29/23 1215  cefoTEtan  (CEFOTAN ) 2 g in sodium chloride  0.9 % 100 mL IVPB        2 g 200  mL/hr over 30 Minutes Intravenous On call to O.R. 09/29/23 0759 09/30/23 2033   09/29/23 1200  vancomycin  (VANCOCIN ) IVPB 1000 mg/200 mL premix       Note to Pharmacy: Pharmacy may adjust dosing strength, interval, or rate of medication as needed for optimal therapy for the patient  Send with patient on call to the OR.  Anesthesia to  complete antibiotic administration <64min prior to incision per [facility].   1,000 mg 200 mL/hr over 60 Minutes Intravenous On call to O.R. 09/29/23 0748 09/29/23 1055   09/29/23 0923  sodium chloride  0.9 % with cefoTEtan  (CEFOTAN ) ADS Med       Note to Pharmacy: [facility]: cabinet override      09/29/23 0923 09/29/23 1028   09/29/23 0923  vancomycin  (VANCOCIN ) 1-5 GM/200ML-% IVPB       Note to Pharmacy: [facility]: cabinet override      09/29/23 0923 09/29/23 1028   09/25/23 2200  metroNIDAZOLE  (FLAGYL ) IVPB 500 mg  Status:  Discontinued        500 mg 100 mL/hr over 60 Minutes Intravenous Every 12 hours 09/25/23 2059 09/26/23 1142   09/25/23 2115  cefTRIAXone  (ROCEPHIN ) 2 g in sodium chloride  0.9 % 100 mL IVPB  Status:  Discontinued        2 g 200 mL/hr over 30 Minutes Intravenous Every 24 hours 09/25/23 2059 09/26/23 1142        Objective: Vitals:   10/03/23 1328 10/03/23 2111 10/04/23 0541 10/04/23 1415  BP: 117/69 121/66 111/62 117/73  Pulse: 79 91 73 82  Resp: 18 18 17 18   Temp: 98.3 F (36.8 C) 98 F (36.7 C) 98 F (36.7 C) 97.6 F (36.4 C)  TempSrc:    Oral  SpO2: 99% 97% 97% 98%  Weight:      Height:        Intake/Output Summary (Last 24 hours) at 10/04/2023 1529 Last data filed at 10/04/2023 1009 Gross per 24 hour  Intake 240 ml  Output 500 ml  Net -260 ml   Filed Weights   09/25/23 1516  Weight: 72.6 kg    Examination:  General: NAD  Cardiovascular: S1, S2 present Respiratory: CTAB Abdomen: Soft, nontender, nondistended, bowel sounds present Musculoskeletal: No bilateral pedal edema noted Skin: Normal Psychiatry: Normal mood   Data Reviewed: I have personally reviewed following labs and imaging studies  CBC: Recent Labs  Lab 09/29/23 0423 09/30/23 0406 10/01/23 0420 10/02/23 0438  WBC 6.6 8.8 6.9 7.0  HGB 13.5 12.7* 12.7* 12.9*  HCT 39.7 37.4* 37.3* 37.4*  MCV 89.4 89.9 88.4 88.6  PLT 269 252 244 249   Basic Metabolic  Panel: Recent Labs  Lab 09/28/23 0515 09/29/23 0424 09/30/23 0406 10/01/23 0420 10/02/23 0438  NA 134* 134* 135 130* 132*  K 3.0* 3.3* 4.7 3.4* 3.8  CL 92* 97* 100 98 102  CO2 29 27 27 23 22   GLUCOSE 87 138* 173* 68* 115*  BUN 22 20 17 13 11   CREATININE 1.43* 1.30* 1.48* 1.12 1.10  CALCIUM  8.3* 8.5* 8.2* 8.0* 8.1*  MG 2.0 1.9 1.9  --  1.7   GFR: Estimated Creatinine Clearance: 52.4 mL/min (by C-G formula based on SCr of 1.1 mg/dL). Liver Function Tests: No results for input(s): AST, ALT, ALKPHOS, BILITOT, PROT, ALBUMIN in the last 168 hours.  No results for input(s): LIPASE, AMYLASE in the last 168 hours. No results for input(s): AMMONIA in the last 168 hours. Coagulation Profile: No  results for input(s): INR, PROTIME in the last 168 hours.  Cardiac Enzymes: No results for input(s): CKTOTAL, CKMB, CKMBINDEX, TROPONINI in the last 168 hours. BNP (last 3 results) No results for input(s): PROBNP in the last 8760 hours. HbA1C: No results for input(s): HGBA1C in the last 72 hours. CBG: Recent Labs  Lab 09/29/23 1156  GLUCAP 150*   Lipid Profile: No results for input(s): CHOL, HDL, LDLCALC, TRIG, CHOLHDL, LDLDIRECT in the last 72 hours. Thyroid  Function Tests: No results for input(s): TSH, T4TOTAL, FREET4, T3FREE, THYROIDAB in the last 72 hours. Anemia Panel: No results for input(s): VITAMINB12, FOLATE, FERRITIN, TIBC, IRON, RETICCTPCT in the last 72 hours. Sepsis Labs: No results for input(s): PROCALCITON, LATICACIDVEN in the last 168 hours.   Recent Results (from the past 240 hours)  Urine Culture     Status: Abnormal   Collection Time: 09/25/23  7:36 PM   Specimen: Urine, Clean Catch  Result Value Ref Range Status   Specimen Description   Final    URINE, CLEAN CATCH Performed at [facility], [address] [place], [place] [address]    Special Requests   Final     NONE Performed at [facility], [address], [place], [place] [address]    Culture (A)  Final    >=100,000 COLONIES/mL METHICILLIN RESISTANT STAPHYLOCOCCUS AUREUS   Report Status 09/28/2023 FINAL  Final   Organism ID, Bacteria METHICILLIN RESISTANT STAPHYLOCOCCUS AUREUS (A)  Final      Susceptibility   Methicillin resistant staphylococcus aureus - MIC*    CIPROFLOXACIN  >=8 RESISTANT Resistant     GENTAMICIN <=0.5 SENSITIVE Sensitive     NITROFURANTOIN <=16 SENSITIVE Sensitive     OXACILLIN >=4 RESISTANT Resistant     TETRACYCLINE <=1 SENSITIVE Sensitive     VANCOMYCIN  <=0.5 SENSITIVE Sensitive     TRIMETH/SULFA <=10 SENSITIVE Sensitive     RIFAMPIN <=0.5 SENSITIVE Sensitive     Inducible Clindamycin NEGATIVE Sensitive     LINEZOLID 2 SENSITIVE Sensitive     * >=100,000 COLONIES/mL METHICILLIN RESISTANT STAPHYLOCOCCUS AUREUS  Culture, blood (Routine X 2) w Reflex to ID Panel     Status: None   Collection Time: 09/25/23  9:30 PM   Specimen: BLOOD RIGHT FOREARM  Result Value Ref Range Status   Specimen Description   Final    BLOOD RIGHT FOREARM Performed at [facility], [address], [place], [place] [address]    Special Requests   Final    BOTTLES DRAWN AEROBIC AND ANAEROBIC Blood Culture results may not be optimal due to an inadequate volume of blood received in culture bottles Performed at [facility], [address] [place], [place] [address]    Culture   Final    NO GROWTH 5 DAYS Performed at [facility], [address] [place], [place] [address]    Report Status 09/30/2023 FINAL  Final  Culture, blood (Routine X 2) w Reflex to ID Panel     Status: None   Collection Time: 09/25/23  9:40 PM   Specimen: BLOOD  Result Value Ref Range Status   Specimen Description   Final    BLOOD RIGHT ANTECUBITAL Performed at [facility], [address], [place], [place] [address]    Special  Requests   Final    BOTTLES DRAWN AEROBIC AND ANAEROBIC Blood Culture adequate volume Performed at [facility], [address], [place], [place] [address]  Culture   Final    NO GROWTH 5 DAYS Performed at [facility], [address] [place], [place] [address]    Report Status 09/30/2023 FINAL  Final  Surgical pcr screen     Status: Abnormal   Collection Time: 09/29/23  8:19 AM   Specimen: Nasal Mucosa; Nasal Swab  Result Value Ref Range Status   MRSA, PCR POSITIVE (A) NEGATIVE Final   Staphylococcus aureus POSITIVE (A) NEGATIVE Final    Comment: (NOTE) The Xpert SA Assay (FDA approved for NASAL specimens in patients 53 years of age and older), is one component of a comprehensive surveillance program. It is not intended to diagnose infection nor to guide or monitor treatment. Performed at [facility], [address] [place], [place] [address]       Radiology Studies: No results found.     Scheduled Meds:  bisacodyl   10 mg Rectal Daily   clopidogrel   75 mg Oral Daily   docusate sodium   100 mg Oral BID   heparin   5,000 Units Subcutaneous Q8H   pantoprazole   40 mg Oral BID   polyethylene glycol  17 g Oral BID   sodium chloride  flush  3-10 mL Intravenous Q12H   Continuous Infusions:     LOS: 9 days     Lebron JINNY Cage, MD [facility] 10/04/2023, 3:29 PM   Available via Epic secure chat 7am-7pm After these hours, please refer to coverage provider listed on [email or web address]

## 2023-10-04 NOTE — TOC Progression Note (Signed)
 Transition of Care [facility]) - Progression Note   Patient Details  Name: Antonio Serrano MRN: [id number] Date of Birth: Jul 11, 1946  Transition of Care [facility]) CM/SW Contact  Duwaine GORMAN Aran, LCSW Phone Number: 10/04/2023, 3:16 PM  Clinical Narrative: Patient has been to [facility] for rehab before and patient and spouse are agreeable to patient returning. CSW attempted to reach Webster in admissions at [facility] to confirm bed but was unable to reach her. CSW left voicemail requesting call back.  Expected Discharge Plan: Skilled Nursing Facility Barriers to Discharge: English As A Second Language Teacher, SNF Pending bed offer  Expected Discharge Plan and Services In-house Referral: Clinical Social Work Post Acute Care Choice: Home Health Living arrangements for the past 2 months: Mobile Home             DME Arranged: N/A DME Agency: NA HH Arranged: PT, OT Date HH Agency Contacted: 10/01/23 Time HH Agency Contacted: 1445 Representative spoke with at [facility] Agency: [facility]  Social Determinants of Health (SDOH) Interventions SDOH Screenings   Food Insecurity: No Food Insecurity (09/25/2023)  Housing: Low Risk  (09/25/2023)  Transportation Needs: No Transportation Needs (09/25/2023)  Utilities: Not At Risk (09/25/2023)  Alcohol  Screen: Low Risk  (11/16/2020)  Depression (PHQ2-9): Medium Risk (07/03/2023)  Financial Resource Strain: Low Risk  (11/28/2022)  Physical Activity: Inactive (08/28/2023)  Social Connections: Moderately Isolated (10/01/2023)  Stress: No Stress Concern Present (11/28/2022)  Tobacco Use: High Risk (09/25/2023)  Health Literacy: Adequate Health Literacy (08/28/2023)   Readmission Risk Interventions    10/01/2023    2:54 PM 06/19/2023   10:18 AM 06/18/2023   10:27 AM  Readmission Risk Prevention Plan  Transportation Screening Complete Complete Complete  PCP or Specialist Appt within 5-7 Days  Complete Complete  Home Care Screening  Complete Complete  Medication Review (RN CM)   Complete Complete  HRI or Home Care Consult Complete    Social Work Consult for Recovery Care Planning/Counseling Complete    Palliative Care Screening Not Applicable    Medication Review Oceanographer) Complete

## 2023-10-04 NOTE — Plan of Care (Signed)
 ?  Problem: Coping: ?Goal: Level of anxiety will decrease ?Outcome: Progressing ?  ?Problem: Safety: ?Goal: Ability to remain free from injury will improve ?Outcome: Progressing ?  ?

## 2023-10-04 NOTE — Progress Notes (Signed)
 5 Days Post-Op lap LOA, appendectomy Subjective: Feels well this morning.  Tolerating a diet without any nausea, does not feel bloated.  Continues to pass flatus but has not had a bowel movement since 2 days ago.  Objective: Vital signs in last 24 hours: Temp:  [98 F (36.7 C)-98.3 F (36.8 C)] 98 F (36.7 C) (01/03 0541) Pulse Rate:  [73-91] 73 (01/03 0541) Resp:  [17-18] 17 (01/03 0541) BP: (111-121)/(62-69) 111/62 (01/03 0541) SpO2:  [97 %-99 %] 97 % (01/03 0541)   Intake/Output from previous day: 01/02 0701 - 01/03 0700 In: 840 [P.O.:840] Out: 400 [Urine:400] Intake/Output this shift: No intake/output data recorded.   General appearance: alert and cooperative GI: soft, nontender, mildly distended  Incisions: Clean, dry and intact.  Small hematoma at the left subcostal port site which is very tender.  No evidence of infection.  Lab Results:  Recent Labs    10/02/23 0438  WBC 7.0  HGB 12.9*  HCT 37.4*  PLT 249   BMET Recent Labs    10/02/23 0438  NA 132*  K 3.8  CL 102  CO2 22  GLUCOSE 115*  BUN 11  CREATININE 1.10  CALCIUM  8.1*   PT/INR No results for input(s): LABPROT, INR in the last 72 hours. ABG No results for input(s): PHART, HCO3 in the last 72 hours.  Invalid input(s): PCO2, PO2  MEDS, Scheduled  bisacodyl   10 mg Rectal Daily   clopidogrel   75 mg Oral Daily   heparin   5,000 Units Subcutaneous Q8H   mupirocin  ointment  1 Application Nasal BID   pantoprazole   40 mg Oral BID   sodium chloride  flush  3-10 mL Intravenous Q12H    Studies/Results: No results found.   Assessment: s/p Procedure(s): LAPAROSCOPY DIAGNOSTIC; BILATERAL TAP BLOCK; LAPAROSCOPIC APPENDECTOMY; LYSIS OF ADHESION Patient Active Problem List   Diagnosis Date Noted   Need for emotional support 10/03/2023   Goals of care, counseling/discussion 10/03/2023   Small bowel obstruction (HCC) 10/03/2023   Palliative care encounter 10/03/2023   Counseling and  coordination of care 10/03/2023   MRSA (methicillin resistant Staphylococcus aureus) colonization 09/29/2023   Hypokalemia 09/28/2023   Leukocytosis 09/25/2023   AKI (acute kidney injury) (HCC) 09/25/2023   SBO (small bowel obstruction) (HCC) 09/25/2023   Esophageal dysphagia 06/16/2023   Gastritis and gastroduodenitis 06/16/2023   Coffee ground emesis 06/16/2023   Acute blood loss anemia 06/15/2023   Upper GI bleed 06/14/2023   UTI (urinary tract infection) 04/19/2023   Thigh pain 04/19/2023   Vomiting 04/19/2023   TIA (transient ischemic attack) 01/04/2022   Hyponatremia    Closed right hip fracture, initial encounter (HCC) 04/23/2021   Diabetes mellitus with complication (HCC) 03/27/2016   Hyperlipidemia 07/29/2015   Conjunctivitis 06/27/2015   Healthcare maintenance 06/27/2015   Type 2 diabetes mellitus with stage 3a chronic kidney disease, without long-term current use of insulin  (HCC) 07/16/2014   Constipation, chronic 07/16/2014   Preventative health care 05/18/2014   Testicular pain, right 06/25/2013   Erectile dysfunction 05/31/2013   Chronic renal insufficiency, stage III (moderate) (HCC)    HTN (hypertension), benign 01/30/2012   Tobacco dependence 01/30/2012    Expected post op course  Plan: Seems to be progressing appropriately status post laparoscopic lysis of adhesions and appendectomy. Ok for d/c from surgical standpoint. Discharge instructions and f/u on chart   LOS: 9 days    Mitzie DELENA Freund MD [facility], [place]    10/04/2023 8:51 AM

## 2023-10-05 DIAGNOSIS — K56609 Unspecified intestinal obstruction, unspecified as to partial versus complete obstruction: Secondary | ICD-10-CM | POA: Diagnosis not present

## 2023-10-05 MED ORDER — POLYETHYLENE GLYCOL 3350 17 G PO PACK: 17.0000 g | PACK | Freq: Two times a day (BID) | ORAL | Status: AC

## 2023-10-05 NOTE — Progress Notes (Signed)
 PROGRESS NOTE    Antonio Serrano  FMW:[id number] DOB: [birth year] DOA: 09/25/2023 PCP: Candise Aleene DEL, MD     Brief Narrative:  Antonio Serrano is a 78 y.o. male with medical history significant of no reported intra-abdominal surgery, presented complaining of mild onset of crampy abdominal pain and more than 10 episodes of vomiting for few days prior to presentation. Patient has been unable to tolerate p.o. diet. In the ER patient's CT A/P was concerning for SBO. General surgery was consulted.  Due to lack of improvement, patient underwent diagnostic laparoscopy with lysis of adhesion, laparoscopic appendectomy by Dr. Sheldon on 12/29.   New events last 24 hours / Subjective: Denies any new complaints. Having BM    Assessment & Plan:   Principal Problem:   SBO (small bowel obstruction) (HCC) Active Problems:   Chronic renal insufficiency, stage III (moderate) (HCC)   Type 2 diabetes mellitus with stage 3a chronic kidney disease, without long-term current use of insulin  (HCC)   Constipation, chronic   Hyponatremia   Leukocytosis   AKI (acute kidney injury) (HCC)   Hypokalemia   MRSA (methicillin resistant Staphylococcus aureus) colonization   Need for emotional support   Goals of care, counseling/discussion   Small bowel obstruction [facility])   Palliative care encounter   Counseling and coordination of care  SBO Status post laparoscopic lysis of adhesion, laparoscopic appendectomy on 12/29 Removed NG tube on 12/31, advanced diet General Surgery signed off on 10/04/2023  AKI Baseline creatinine 1.3 Resolved with IV fluid  MRSA in urine Urine culture from 12/25 showed MRSA. Patient has no urinary symptoms, fever, blood cultures are negative. No systemic signs of infection. Repeat UA on 12/27 is negative. Discussed with ID on call. In setting of no systemic signs and symptoms, this is likely a contaminant. Monitor.   Hypokalemia Replace prn  Prior history of CVA Continue  Plavix   Hypertension BP stable, continue to hold Norvasc   Goals of care discussion Palliative consult as requested by family Difficult family dynamics noted after extensively discussing with patient's son and daughter-in-law on 10/02/2023.  Reported patient mostly spends time in his recliner/bed, refusing to ambulate, go to appointments with poor oral intake.  Have been working with his PCP to establish care for patient in a nursing home as they are unable to continue to take care of him.  Family had multiple complaints including patient's inability to ambulate and a questionable diagnosis of Parkinson's disease of which patient had been referred to neurology outpatient, but refuses to go for any appointments. Appreciate palliative recs Patient is noted to have capacity to make informed decisions.      DVT prophylaxis:  heparin  injection 5,000 Units Start: 09/26/23 0600 SCDs Start: 09/25/23 2127  Code Status: Full code Family Communication: Discussed extensively with daughter-in-law on 10/04/2023 over the phone Disposition Plan: SNF Status is: Inpatient Remains inpatient appropriate because: Level of care    Antimicrobials:  Anti-infectives (From admission, onward)    Start     Dose/Rate Route Frequency Ordered Stop   09/29/23 2200  cefoTEtan  (CEFOTAN ) 2 g in sodium chloride  0.9 % 100 mL IVPB        2 g 200 mL/hr over 30 Minutes Intravenous Every 12 hours 09/29/23 1313 09/30/23 2033   09/29/23 1215  cefoTEtan  (CEFOTAN ) 2 g in sodium chloride  0.9 % 100 mL IVPB        2 g 200 mL/hr over 30 Minutes Intravenous On call to O.R. 09/29/23 0759 09/30/23 2033   09/29/23  1200  vancomycin  (VANCOCIN ) IVPB 1000 mg/200 mL premix       Note to Pharmacy: Pharmacy may adjust dosing strength, interval, or rate of medication as needed for optimal therapy for the patient  Send with patient on call to the OR.  Anesthesia to complete antibiotic administration <69min prior to incision per [facility].    1,000 mg 200 mL/hr over 60 Minutes Intravenous On call to O.R. 09/29/23 0748 09/29/23 1055   09/29/23 0923  sodium chloride  0.9 % with cefoTEtan  (CEFOTAN ) ADS Med       Note to Pharmacy: [facility]: cabinet override      09/29/23 0923 09/29/23 1028   09/29/23 0923  vancomycin  (VANCOCIN ) 1-5 GM/200ML-% IVPB       Note to Pharmacy: [facility] M: cabinet override      09/29/23 0923 09/29/23 1028   09/25/23 2200  metroNIDAZOLE  (FLAGYL ) IVPB 500 mg  Status:  Discontinued        500 mg 100 mL/hr over 60 Minutes Intravenous Every 12 hours 09/25/23 2059 09/26/23 1142   09/25/23 2115  cefTRIAXone  (ROCEPHIN ) 2 g in sodium chloride  0.9 % 100 mL IVPB  Status:  Discontinued        2 g 200 mL/hr over 30 Minutes Intravenous Every 24 hours 09/25/23 2059 09/26/23 1142        Objective: Vitals:   10/04/23 1415 10/04/23 2000 10/05/23 0652 10/05/23 1407  BP: 117/73 128/88 106/66 105/69  Pulse: 82 83 90 73  Resp: 18 18 18 16   Temp: 97.6 F (36.4 C) 97.7 F (36.5 C) 97.8 F (36.6 C) 98.6 F (37 C)  TempSrc: Oral Oral Oral   SpO2: 98% 97% (!) 88% 100%  Weight:      Height:        Intake/Output Summary (Last 24 hours) at 10/05/2023 1523 Last data filed at 10/05/2023 1408 Gross per 24 hour  Intake 1490 ml  Output 900 ml  Net 590 ml   Filed Weights   09/25/23 1516  Weight: 72.6 kg    Examination:  General: NAD  Cardiovascular: S1, S2 present Respiratory: CTAB Abdomen: Soft, nontender, nondistended, bowel sounds present Musculoskeletal: No bilateral pedal edema noted Skin: Normal Psychiatry: Normal mood   Data Reviewed: I have personally reviewed following labs and imaging studies  CBC: Recent Labs  Lab 09/29/23 0423 09/30/23 0406 10/01/23 0420 10/02/23 0438  WBC 6.6 8.8 6.9 7.0  HGB 13.5 12.7* 12.7* 12.9*  HCT 39.7 37.4* 37.3* 37.4*  MCV 89.4 89.9 88.4 88.6  PLT 269 252 244 249   Basic Metabolic Panel: Recent Labs  Lab 09/29/23 0424 09/30/23 0406 10/01/23 0420  10/02/23 0438  NA 134* 135 130* 132*  K 3.3* 4.7 3.4* 3.8  CL 97* 100 98 102  CO2 27 27 23 22   GLUCOSE 138* 173* 68* 115*  BUN 20 17 13 11   CREATININE 1.30* 1.48* 1.12 1.10  CALCIUM  8.5* 8.2* 8.0* 8.1*  MG 1.9 1.9  --  1.7   GFR: Estimated Creatinine Clearance: 52.4 mL/min (by C-G formula based on SCr of 1.1 mg/dL). Liver Function Tests: No results for input(s): AST, ALT, ALKPHOS, BILITOT, PROT, ALBUMIN in the last 168 hours.  No results for input(s): LIPASE, AMYLASE in the last 168 hours. No results for input(s): AMMONIA in the last 168 hours. Coagulation Profile: No results for input(s): INR, PROTIME in the last 168 hours.  Cardiac Enzymes: No results for input(s): CKTOTAL, CKMB, CKMBINDEX, TROPONINI in the last 168 hours.  BNP (last 3 results) No results for input(s): PROBNP in the last 8760 hours. HbA1C: No results for input(s): HGBA1C in the last 72 hours. CBG: Recent Labs  Lab 09/29/23 1156  GLUCAP 150*   Lipid Profile: No results for input(s): CHOL, HDL, LDLCALC, TRIG, CHOLHDL, LDLDIRECT in the last 72 hours. Thyroid  Function Tests: No results for input(s): TSH, T4TOTAL, FREET4, T3FREE, THYROIDAB in the last 72 hours. Anemia Panel: No results for input(s): VITAMINB12, FOLATE, FERRITIN, TIBC, IRON, RETICCTPCT in the last 72 hours. Sepsis Labs: No results for input(s): PROCALCITON, LATICACIDVEN in the last 168 hours.   Recent Results (from the past 240 hours)  Urine Culture     Status: Abnormal   Collection Time: 09/25/23  7:36 PM   Specimen: Urine, Clean Catch  Result Value Ref Range Status   Specimen Description   Final    URINE, CLEAN CATCH Performed at [facility], [address], [place], [place] [address]    Special Requests   Final    NONE Performed at [facility], [address], [place], [place] [address]    Culture (A)  Final     >=100,000 COLONIES/mL METHICILLIN RESISTANT STAPHYLOCOCCUS AUREUS   Report Status 09/28/2023 FINAL  Final   Organism ID, Bacteria METHICILLIN RESISTANT STAPHYLOCOCCUS AUREUS (A)  Final      Susceptibility   Methicillin resistant staphylococcus aureus - MIC*    CIPROFLOXACIN  >=8 RESISTANT Resistant     GENTAMICIN <=0.5 SENSITIVE Sensitive     NITROFURANTOIN <=16 SENSITIVE Sensitive     OXACILLIN >=4 RESISTANT Resistant     TETRACYCLINE <=1 SENSITIVE Sensitive     VANCOMYCIN  <=0.5 SENSITIVE Sensitive     TRIMETH/SULFA <=10 SENSITIVE Sensitive     RIFAMPIN <=0.5 SENSITIVE Sensitive     Inducible Clindamycin NEGATIVE Sensitive     LINEZOLID 2 SENSITIVE Sensitive     * >=100,000 COLONIES/mL METHICILLIN RESISTANT STAPHYLOCOCCUS AUREUS  Culture, blood (Routine X 2) w Reflex to ID Panel     Status: None   Collection Time: 09/25/23  9:30 PM   Specimen: BLOOD RIGHT FOREARM  Result Value Ref Range Status   Specimen Description   Final    BLOOD RIGHT FOREARM Performed at [facility], [address], [place], [place] [address]    Special Requests   Final    BOTTLES DRAWN AEROBIC AND ANAEROBIC Blood Culture results may not be optimal due to an inadequate volume of blood received in culture bottles Performed at [facility], [address], [place], [place] [address]    Culture   Final    NO GROWTH 5 DAYS Performed at [facility], [address], [place], [place] [address]    Report Status 09/30/2023 FINAL  Final  Culture, blood (Routine X 2) w Reflex to ID Panel     Status: None   Collection Time: 09/25/23  9:40 PM   Specimen: BLOOD  Result Value Ref Range Status   Specimen Description   Final    BLOOD RIGHT ANTECUBITAL Performed at [facility], [address] [place], [place] [address]    Special Requests   Final    BOTTLES DRAWN AEROBIC AND ANAEROBIC Blood Culture adequate volume Performed at [facility], [address], [place], [place] [address]    Culture   Final    NO GROWTH 5 DAYS Performed at [facility], [address] [place], [place] [address]  Report Status 09/30/2023 FINAL  Final  Surgical pcr screen     Status: Abnormal   Collection Time: 09/29/23  8:19 AM   Specimen: Nasal Mucosa; Nasal Swab  Result Value Ref Range Status   MRSA, PCR POSITIVE (A) NEGATIVE Final   Staphylococcus aureus POSITIVE (A) NEGATIVE Final    Comment: (NOTE) The Xpert SA Assay (FDA approved for NASAL specimens in patients 65 years of age and older), is one component of a comprehensive surveillance program. It is not intended to diagnose infection nor to guide or monitor treatment. Performed at [facility], [address], [place], [place] [address]       Radiology Studies: No results found.     Scheduled Meds:  bisacodyl   10 mg Rectal Daily   clopidogrel   75 mg Oral Daily   docusate sodium   100 mg Oral BID   heparin   5,000 Units Subcutaneous Q8H   pantoprazole   40 mg Oral BID   polyethylene glycol  17 g Oral BID   sodium chloride  flush  3-10 mL Intravenous Q12H   Continuous Infusions:     LOS: 10 days     Lebron JINNY Cage, MD [facility] 10/05/2023, 3:23 PM   Available via Epic secure chat 7am-7pm After these hours, please refer to coverage provider listed on [email or web address]

## 2023-10-05 NOTE — Progress Notes (Signed)
 10/05/2023  Antonio Serrano [id number] 10/12/1945  CARE TEAM: PCP: Candise Aleene DEL, MD  Outpatient Care Team: Patient Care Team: Candise Aleene DEL, MD as PCP - General (Family Medicine) Barkley Kid (Dentistry) Glendia Simmonds, OD as Consulting Physician (Optometry) Prescilla Beams, MD as Consulting Physician (Nephrology) [facility] Croitoru, Jerel, MD as Consulting Physician (Cardiology) Bertrum Rosina HERO, RN as [facility] Care Management (General Practice)  Inpatient Treatment Team: Treatment Team:  Donnamarie Lebron PARAS, MD Bobbette Cartwright, MD [facility], Md, MD Patric Bodily, NT Claudene Darice CROME, PT Cresenciano Asberry PARAS, RN Erlenmeyer, Bernardino HERO, RN   Problem List:   Principal Problem:   SBO (small bowel obstruction) [facility]) Active Problems:   Chronic renal insufficiency, stage III (moderate) (HCC)   Type 2 diabetes mellitus with stage 3a chronic kidney disease, without long-term current use of insulin  (HCC)   Constipation, chronic   Hyponatremia   Leukocytosis   AKI (acute kidney injury) (HCC)   Hypokalemia   MRSA (methicillin resistant Staphylococcus aureus) colonization   Need for emotional support   Goals of care, counseling/discussion   Small bowel obstruction (HCC)   Palliative care encounter   Counseling and coordination of care   09/29/2023  POST-OPERATIVE DIAGNOSIS:   CLOSED LOOP BOWEL OBSTRUCTION DUE TO ADHESION TO ABNORMAL APPENDIX   PROCEDURE:  DIAGNOSTIC LAPAROSCOPY WITH LYSIS OF ADHESIONS LAPAROSCOPIC APPENDECTOMY TRANSVERSUS ABDOMINIS PLANE (TAP) BLOCK - 76ILATERAL   SURGEON:  Elspeth KYM Schultze, MD  OR FINDINGS:  Closed-loop obstruction of mid jejunum due to adhesion from sigmoid colon epiploic appendage to tip of appendix and right lower quadrant.  Significant ischemia but markedly improved after decompression.  No necrosis or perforation or stricture.  Therefore no small bowel resection needed.   Very long appendix with some twisting and abnormal  thickening at the tip of adhesion.  No obvious mucin or mass/tumor.  Mildly floppy high riding cecum and shortened ascending colon.  Not classic for appendicitis but appendectomy done.  Assessment [facility] Stay = 10 days) 76 Days Post-Op    Postop ileus resolving status post lyse lesions for closed-loop bowel obstruction    Plan:  -Solid diet -Bowel regimen.  Would lean toward MiraLAX  given his advanced age 78 and chronic constipation. -VTE prophylaxis- SCDs, etc -mobilize as tolerated to help recovery -Disposition: Okay to discharge from surgery standpoint.  We will sign off for now.  Will try and set up appointment to see us  in the office in a few weeks to make sure he is continue to recover   I reviewed last 24 h vitals and pain scores, last 48 h intake and output, last 24 h labs and trends, and last 24 h imaging results.  I have reviewed this patient's available data, including medical history, events of note, test results, etc as part of my evaluation.   A significant portion of that time was spent in counseling. Care during the described time interval was provided by me.  This care required moderate level of medical decision making.  10/05/2023    Subjective: (Chief complaint)  Patient feeling better.  Tolerate solid diet.  Denies much pain and suffering left upper quadrant port site.  That is getting better.  Hoping to leave the hospital today  Objective:  Vital signs:  Vitals:   76/03/25 0541 76/03/25 1415 76/03/25 2000 76/04/25 0652  BP: 111/62 117/73 128/88 106/66  Pulse: 73 82 83 90  Resp: 17 18 18 18   Temp: 98 F (36.7 C) 97.6 F (36.4 C) 97.7 F (36.5  C) 97.8 F (36.6 C)  TempSrc:  Oral Oral Oral  SpO2: 97% 98% 97% (!) 88%  Weight:      Height:        Last BM Date : 10/02/23  Intake/Output   Yesterday:  01/03 0701 - 01/04 0700 In: 1070 [P.O.:1070] Out: 800 [Urine:800] This shift:  Total I/O In: -  Out: 100 [Urine:100]  Bowel  function:  Flatus: YES  BM:  YES  Drain: (No drain)   Physical Exam:  General: Pt awake/alert in no acute distress Eyes: PERRL, normal EOM.  Sclera clear.  No icterus Neuro: CN II-XII intact w/o focal sensory/motor deficits. Lymph: No head/neck/groin lymphadenopathy Psych:  No delerium/psychosis/paranoia.  Oriented x 76 HENT: Normocephalic, Mucus membranes moist.  No thrush Neck: Supple, No tracheal deviation.  No obvious thyromegaly Chest: No pain to chest wall compression.  Good respiratory excursion.  No audible wheezing CV:  Pulses intact.  Regular rhythm.  No major extremity edema MS: Normal AROM mjr joints.  No obvious deformity  Abdomen: Soft.  Nondistended.  Mildly tender at incisions only.  No cellulitis.  No hernias.  No evidence of peritonitis.  No incarcerated hernias.  Ext:   No deformity.  No mjr edema.  No cyanosis Skin: No petechiae / purpurea.  No major sores.  Warm and dry    Results:   Cultures: Recent Results (from the past 76 hours)  Urine Culture     Status: Abnormal   Collection Time: 09/25/23  7:36 PM   Specimen: Urine, Clean Catch  Result Value Ref Range Status   Specimen Description   Final    URINE, CLEAN CATCH Performed at [facility], [address] [place], [place] [address]    Special Requests   Final    NONE Performed at [facility], [address] [place], [place] [address]    Culture (A)  Final    >=100,000 COLONIES/mL METHICILLIN RESISTANT STAPHYLOCOCCUS AUREUS   Report Status 09/28/2023 FINAL  Final   Organism ID, Bacteria METHICILLIN RESISTANT STAPHYLOCOCCUS AUREUS (A)  Final      Susceptibility   Methicillin resistant staphylococcus aureus - MIC*    CIPROFLOXACIN  >=8 RESISTANT Resistant     GENTAMICIN <=0.5 SENSITIVE Sensitive     NITROFURANTOIN <=16 SENSITIVE Sensitive     OXACILLIN >=4 RESISTANT Resistant     TETRACYCLINE <=1 SENSITIVE Sensitive     VANCOMYCIN  <=0.5 SENSITIVE  Sensitive     TRIMETH/SULFA <=10 SENSITIVE Sensitive     RIFAMPIN <=0.5 SENSITIVE Sensitive     Inducible Clindamycin NEGATIVE Sensitive     LINEZOLID 2 SENSITIVE Sensitive     * >=100,000 COLONIES/mL METHICILLIN RESISTANT STAPHYLOCOCCUS AUREUS  Culture, blood (Routine X 2) w Reflex to ID Panel     Status: None   Collection Time: 09/25/23  9:30 PM   Specimen: BLOOD RIGHT FOREARM  Result Value Ref Range Status   Specimen Description   Final    BLOOD RIGHT FOREARM Performed at [facility], [address] [place], [place] [address]    Special Requests   Final    BOTTLES DRAWN AEROBIC AND ANAEROBIC Blood Culture results may not be optimal due to an inadequate volume of blood received in culture bottles Performed at [facility], [address], [place], [place] [address]    Culture   Final    NO GROWTH 5 DAYS Performed at [facility], [address] [place], [place] [address]  Report Status 09/30/2023 FINAL  Final  Culture, blood (Routine X 2) w Reflex to ID Panel     Status: None   Collection Time: 09/25/23  9:40 PM   Specimen: BLOOD  Result Value Ref Range Status   Specimen Description   Final    BLOOD RIGHT ANTECUBITAL Performed at [facility], [address] [place], [place] [address]    Special Requests   Final    BOTTLES DRAWN AEROBIC AND ANAEROBIC Blood Culture adequate volume Performed at [facility], [address] [place], [place] [address]    Culture   Final    NO GROWTH 5 DAYS Performed at [facility], [address] [place], [place] [address]    Report Status 09/30/2023 FINAL  Final  Surgical pcr screen     Status: Abnormal   Collection Time: 09/29/23  8:19 AM   Specimen: Nasal Mucosa; Nasal Swab  Result Value Ref Range Status   MRSA, PCR POSITIVE (A) NEGATIVE Final   Staphylococcus aureus POSITIVE (A) NEGATIVE Final    Comment: (NOTE) The Xpert SA Assay (FDA  approved for NASAL specimens in patients 68 years of age and older), is one component of a comprehensive surveillance program. It is not intended to diagnose infection nor to guide or monitor treatment. Performed at [facility], [address], [place], [place] [address]     Labs: No results found for this or any previous visit (from the past 48 hours).  Imaging / Studies: No results found.  Medications / Allergies: per chart  Antibiotics: Anti-infectives (From admission, onward)    Start     Dose/Rate Route Frequency Ordered Stop   09/29/23 2200  cefoTEtan  (CEFOTAN ) 2 g in sodium chloride  0.9 % 100 mL IVPB        2 g 200 mL/hr over 30 Minutes Intravenous Every 12 hours 09/29/23 1313 09/30/23 2033   09/29/23 1215  cefoTEtan  (CEFOTAN ) 2 g in sodium chloride  0.9 % 100 mL IVPB        2 g 200 mL/hr over 30 Minutes Intravenous On call to O.R. 09/29/23 0759 09/30/23 2033   09/29/23 1200  vancomycin  (VANCOCIN ) IVPB 1000 mg/200 mL premix       Note to Pharmacy: Pharmacy may adjust dosing strength, interval, or rate of medication as needed for optimal therapy for the patient  Send with patient on call to the OR.  Anesthesia to complete antibiotic administration <31min prior to incision per [facility].   1,000 mg 200 mL/hr over 60 Minutes Intravenous On call to O.R. 09/29/23 0748 09/29/23 1055   09/29/23 0923  sodium chloride  0.9 % with cefoTEtan  (CEFOTAN ) ADS Med       Note to Pharmacy: [facility]: cabinet override      09/29/23 0923 09/29/23 1028   09/29/23 0923  vancomycin  (VANCOCIN ) 1-5 GM/200ML-% IVPB       Note to Pharmacy: [facility] M: cabinet override      09/29/23 0923 09/29/23 1028   09/25/23 2200  metroNIDAZOLE  (FLAGYL ) IVPB 500 mg  Status:  Discontinued        500 mg 100 mL/hr over 60 Minutes Intravenous Every 12 hours 09/25/23 2059 09/26/23 1142   09/25/23 2115  cefTRIAXone  (ROCEPHIN ) 2 g in sodium chloride  0.9 % 100 mL IVPB  Status:   Discontinued        2 g 200 mL/hr over 30 Minutes Intravenous Every 24 hours 09/25/23 2059 09/26/23 1142  Note: Portions of this report may have been transcribed using voice recognition software. Every effort was made to ensure accuracy; however, inadvertent computerized transcription errors may be present.   Any transcriptional errors that result from this process are unintentional.    Elspeth KYM Schultze, MD, FACS, MASCRS Esophageal, Gastrointestinal & Colorectal Surgery Robotic and Minimally Invasive Surgery  [facility] [address], [address] [place], [place] [address] [phone] Fax [phone] Main  CONTACT INFORMATION: Weekday (9AM-5PM): Call [facility] main office at [phone] Weeknight (5PM-9AM) or Weekend/Holiday: Check EPIC Web Links tab & use AMION (password  [email or web address]) for General Surgery [facility] coverage  Please, DO NOT use SecureChat  (it is not reliable communication to reach operating surgeons & will lead to a delay in care).   Epic staff messaging available for outptient concerns needing 1-2 business day response.      10/05/2023  8:20 AM

## 2023-10-05 NOTE — Progress Notes (Signed)
 PT Cancellation Note  Patient Details Name: Antonio Serrano MRN: [id number] DOB: 09-26-1946   Cancelled Treatment:    Reason Eval/Treat Not Completed: Patient declined, no reason specified. Pt declined PT stating he has worked the last two days and he may be going to [employer] today.  PT told him that she was not aware of any pending d/c. Pt still declined doing PT today. Will check back as schedule permits.   Darice LITTIE Sharps 10/05/2023, 2:17 PM

## 2023-10-05 NOTE — Plan of Care (Signed)
   Problem: Clinical Measurements: Goal: Respiratory complications will improve Outcome: Progressing

## 2023-10-06 DIAGNOSIS — K56609 Unspecified intestinal obstruction, unspecified as to partial versus complete obstruction: Secondary | ICD-10-CM | POA: Diagnosis not present

## 2023-10-06 LAB — CBC
HCT: 37.2 % — ABNORMAL LOW (ref 39.0–52.0)
Hemoglobin: 12.9 g/dL — ABNORMAL LOW (ref 13.0–17.0)
MCH: 30.1 pg (ref 26.0–34.0)
MCHC: 34.7 g/dL (ref 30.0–36.0)
MCV: 86.7 fL (ref 80.0–100.0)
Platelets: 327 10*3/uL (ref 150–400)
RBC: 4.29 MIL/uL (ref 4.22–5.81)
RDW: 15 % (ref 11.5–15.5)
WBC: 9.3 10*3/uL (ref 4.0–10.5)
nRBC: 0 % (ref 0.0–0.2)

## 2023-10-06 LAB — BASIC METABOLIC PANEL
Anion gap: 10 (ref 5–15)
BUN: 14 mg/dL (ref 8–23)
CO2: 21 mmol/L — ABNORMAL LOW (ref 22–32)
Calcium: 8.2 mg/dL — ABNORMAL LOW (ref 8.9–10.3)
Chloride: 101 mmol/L (ref 98–111)
Creatinine, Ser: 0.95 mg/dL (ref 0.61–1.24)
GFR, Estimated: >60 mL/min (ref >60)
Glucose, Bld: 82 mg/dL (ref 70–99)
Potassium: 3.1 mmol/L — ABNORMAL LOW (ref 3.5–5.1)
Sodium: 132 mmol/L — ABNORMAL LOW (ref 135–145)

## 2023-10-06 LAB — MAGNESIUM: Magnesium: 1.7 mg/dL (ref 1.7–2.4)

## 2023-10-06 MED ORDER — POTASSIUM CHLORIDE CRYS ER 20 MEQ PO TBCR
40.0000 meq | EXTENDED_RELEASE_TABLET | Freq: Two times a day (BID) | ORAL | Status: AC
  Administered 2023-10-06 (×2): 40 meq via ORAL
  Filled 2023-10-06 (×2): qty 2

## 2023-10-06 NOTE — Progress Notes (Signed)
 PROGRESS NOTE    Antonio Serrano  FMW:[id number] DOB: 02-12-1946 DOA: 09/25/2023 PCP: Candise Aleene DEL, MD     Brief Narrative:  Antonio Serrano is a 78 y.o. male with medical history significant of no reported intra-abdominal surgery, presented complaining of mild onset of crampy abdominal pain and more than 10 episodes of vomiting for few days prior to presentation. Patient has been unable to tolerate p.o. diet. In the ER patient's CT A/P was concerning for SBO. General surgery was consulted.  Due to lack of improvement, patient underwent diagnostic laparoscopy with lysis of adhesion, laparoscopic appendectomy by Dr. Sheldon on 12/29.   New events last 24 hours / Subjective: Denies any new complaints.  Patient was mad about getting his breakfast today around 10 AM.    Assessment & Plan:   Principal Problem:   SBO (small bowel obstruction) (HCC) Active Problems:   Chronic renal insufficiency, stage III (moderate) (HCC)   Type 2 diabetes mellitus with stage 3a chronic kidney disease, without long-term current use of insulin  (HCC)   Constipation, chronic   Hyponatremia   Leukocytosis   AKI (acute kidney injury) (HCC)   Hypokalemia   MRSA (methicillin resistant Staphylococcus aureus) colonization   Need for emotional support   Goals of care, counseling/discussion   Small bowel obstruction (HCC)   Palliative care encounter   Counseling and coordination of care  SBO Status post laparoscopic lysis of adhesion, laparoscopic appendectomy on 12/29 Removed NG tube on 12/31, advanced diet General Surgery signed off on 10/04/2023  AKI Baseline creatinine 1.3 Resolved with IV fluid  MRSA in urine Urine culture from 12/25 showed MRSA. Patient has no urinary symptoms, fever, blood cultures are negative. No systemic signs of infection. Repeat UA on 12/27 is negative. Discussed with ID on call. In setting of no systemic signs and symptoms, this is likely a contaminant. Monitor.    Hypokalemia Replaced prn  Prior history of CVA Continue Plavix   Hypertension BP stable, continue to hold Norvasc   Goals of care discussion Palliative consult as requested by family Difficult family dynamics noted after extensively discussing with patient's son and daughter-in-law on 10/02/2023.  Reported patient mostly spends time in his recliner/bed, refusing to ambulate, go to appointments with poor oral intake.  Have been working with his PCP to establish care for patient in a nursing home as they are unable to continue to take care of him.  Family had multiple complaints including patient's inability to ambulate and a questionable diagnosis of Parkinson's disease of which patient had been referred to neurology outpatient, but refuses to go for any appointments. Appreciate palliative recs Patient is noted to have capacity to make informed decisions.      DVT prophylaxis:  heparin  injection 5,000 Units Start: 09/26/23 0600 SCDs Start: 09/25/23 2127  Code Status: Full code Family Communication: Discussed extensively with daughter-in-law on 10/04/2023 over the phone Disposition Plan: SNF Status is: Inpatient Remains inpatient appropriate because: Level of care    Antimicrobials:  Anti-infectives (From admission, onward)    Start     Dose/Rate Route Frequency Ordered Stop   09/29/23 2200  cefoTEtan  (CEFOTAN ) 2 g in sodium chloride  0.9 % 100 mL IVPB        2 g 200 mL/hr over 30 Minutes Intravenous Every 12 hours 09/29/23 1313 09/30/23 2033   09/29/23 1215  cefoTEtan  (CEFOTAN ) 2 g in sodium chloride  0.9 % 100 mL IVPB        2 g 200 mL/hr over 30 Minutes Intravenous On  call to O.R. 09/29/23 0759 09/30/23 2033   09/29/23 1200  vancomycin  (VANCOCIN ) IVPB 1000 mg/200 mL premix       Note to Pharmacy: Pharmacy may adjust dosing strength, interval, or rate of medication as needed for optimal therapy for the patient  Send with patient on call to the OR.  Anesthesia to complete  antibiotic administration <51min prior to incision per [facility].   1,000 mg 200 mL/hr over 60 Minutes Intravenous On call to O.R. 09/29/23 0748 09/29/23 1055   09/29/23 0923  sodium chloride  0.9 % with cefoTEtan  (CEFOTAN ) ADS Med       Note to Pharmacy: [facility]: cabinet override      09/29/23 0923 09/29/23 1028   09/29/23 0923  vancomycin  (VANCOCIN ) 1-5 GM/200ML-% IVPB       Note to Pharmacy: [facility]: cabinet override      09/29/23 0923 09/29/23 1028   09/25/23 2200  metroNIDAZOLE  (FLAGYL ) IVPB 500 mg  Status:  Discontinued        500 mg 100 mL/hr over 60 Minutes Intravenous Every 12 hours 09/25/23 2059 09/26/23 1142   09/25/23 2115  cefTRIAXone  (ROCEPHIN ) 2 g in sodium chloride  0.9 % 100 mL IVPB  Status:  Discontinued        2 g 200 mL/hr over 30 Minutes Intravenous Every 24 hours 09/25/23 2059 09/26/23 1142        Objective: Vitals:   10/05/23 1407 10/05/23 2010 10/06/23 0435 10/06/23 1308  BP: 105/69 123/61 (!) 131/53 131/71  Pulse: 73 66 67 68  Resp: 16 16 16 16   Temp: 98.6 F (37 C) 98.7 F (37.1 C) 98 F (36.7 C) 97.7 F (36.5 C)  TempSrc:  Oral Oral Oral  SpO2: 100% 100% 97% 100%  Weight:      Height:        Intake/Output Summary (Last 24 hours) at 10/06/2023 1437 Last data filed at 10/06/2023 1000 Gross per 24 hour  Intake 900 ml  Output 400 ml  Net 500 ml   Filed Weights   09/25/23 1516  Weight: 72.6 kg    Examination:  General: NAD  Cardiovascular: S1, S2 present Respiratory: CTAB Abdomen: Soft, nontender, nondistended, bowel sounds present Musculoskeletal: No bilateral pedal edema noted Skin: Normal Psychiatry: Normal mood   Data Reviewed: I have personally reviewed following labs and imaging studies  CBC: Recent Labs  Lab 09/30/23 0406 10/01/23 0420 10/02/23 0438 10/06/23 1007  WBC 8.8 6.9 7.0 9.3  HGB 12.7* 12.7* 12.9* 12.9*  HCT 37.4* 37.3* 37.4* 37.2*  MCV 89.9 88.4 88.6 86.7  PLT 252 244 249 327   Basic Metabolic  Panel: Recent Labs  Lab 09/30/23 0406 10/01/23 0420 10/02/23 0438 10/06/23 1007  NA 135 130* 132* 132*  K 4.7 3.4* 3.8 3.1*  CL 100 98 102 101  CO2 27 23 22  21*  GLUCOSE 173* 68* 115* 82  BUN 17 13 11 14   CREATININE 1.48* 1.12 1.10 0.95  CALCIUM  8.2* 8.0* 8.1* 8.2*  MG 1.9  --  1.7  --    GFR: Estimated Creatinine Clearance: 60.6 mL/min (by C-G formula based on SCr of 0.95 mg/dL). Liver Function Tests: No results for input(s): AST, ALT, ALKPHOS, BILITOT, PROT, ALBUMIN in the last 168 hours.  No results for input(s): LIPASE, AMYLASE in the last 168 hours. No results for input(s): AMMONIA in the last 168 hours. Coagulation Profile: No results for input(s): INR, PROTIME in the last 168 hours.  Cardiac Enzymes: No  results for input(s): CKTOTAL, CKMB, CKMBINDEX, TROPONINI in the last 168 hours. BNP (last 3 results) No results for input(s): PROBNP in the last 8760 hours. HbA1C: No results for input(s): HGBA1C in the last 72 hours. CBG: No results for input(s): GLUCAP in the last 168 hours.  Lipid Profile: No results for input(s): CHOL, HDL, LDLCALC, TRIG, CHOLHDL, LDLDIRECT in the last 72 hours. Thyroid  Function Tests: No results for input(s): TSH, T4TOTAL, FREET4, T3FREE, THYROIDAB in the last 72 hours. Anemia Panel: No results for input(s): VITAMINB12, FOLATE, FERRITIN, TIBC, IRON, RETICCTPCT in the last 72 hours. Sepsis Labs: No results for input(s): PROCALCITON, LATICACIDVEN in the last 168 hours.   Recent Results (from the past 240 hours)  Surgical pcr screen     Status: Abnormal   Collection Time: 09/29/23  8:19 AM   Specimen: Nasal Mucosa; Nasal Swab  Result Value Ref Range Status   MRSA, PCR POSITIVE (A) NEGATIVE Final   Staphylococcus aureus POSITIVE (A) NEGATIVE Final    Comment: (NOTE) The Xpert SA Assay (FDA approved for NASAL specimens in patients 50 years of age and older), is one  component of a comprehensive surveillance program. It is not intended to diagnose infection nor to guide or monitor treatment. Performed at [facility], [address], [place], [place] [address]       Radiology Studies: No results found.     Scheduled Meds:  bisacodyl   10 mg Rectal Daily   clopidogrel   75 mg Oral Daily   docusate sodium   100 mg Oral BID   heparin   5,000 Units Subcutaneous Q8H   pantoprazole   40 mg Oral BID   polyethylene glycol  17 g Oral BID   sodium chloride  flush  3-10 mL Intravenous Q12H   Continuous Infusions:     LOS: 11 days     Lebron JINNY Cage, MD [facility] 10/06/2023, 2:37 PM   Available via Epic secure chat 7am-7pm After these hours, please refer to coverage provider listed on [email or web address]

## 2023-10-06 NOTE — Progress Notes (Signed)
 Occupational Therapy Treatment Patient Details Name: Beatrice Sehgal MRN: [id number] DOB: 1946-02-03 Today's Date: 10/06/2023   History of present illness Patient is a 78 year old male who presented with mild crampy abdominal pain and more than 10 episodes of vomiting.  In the ER patient's CAT scan is concerning for small bowel obstruction, patient has had nasogastric tube placed. General surgery was consulted.  Due to lack of improvement, patient underwent diagnostic laparoscopy with lysis of adhesion, laparoscopic appendectomy by Dr. Sheldon on 12/29. PMH: TIA, HTN, THA, CKD III   OT comments  Patient was seen per MD request today. Patient engaged in therapeutic activity in standing to improve standing tolerance and increased time on feet. Patient continues to make progress towrads goals. Patient declined to get out of bed to recliner today. Patient will benefit from continued inpatient follow up therapy, <3 hours/day. Patient's discharge plan remains appropriate at this time. OT will continue to follow acutely.        If plan is discharge home, recommend the following:  A lot of help with bathing/dressing/bathroom;Assistance with cooking/housework;Direct supervision/assist for medications management;Assist for transportation;Help with stairs or ramp for entrance;Direct supervision/assist for financial management   Equipment Recommendations  None recommended by OT       Precautions / Restrictions Precautions Precautions: Fall Precaution Comments: recent ABD surgery Restrictions Weight Bearing Restrictions Per Provider Order: No       Mobility Bed Mobility Overal bed mobility: Needs Assistance Bed Mobility: Sit to Supine     Supine to sit: Mod assist Sit to supine: Mod assist             Balance Overall balance assessment: Needs assistance Sitting-balance support: Feet supported, No upper extremity supported Sitting balance-Leahy Scale: Fair     Standing balance support:  Single extremity supported Standing balance-Leahy Scale: Fair         ADL either performed or assessed with clinical judgement   ADL Overall ADL's : Needs assistance/impaired         Lower Body Dressing Details (indicate cue type and reason): min A to don slippers sitting EOB.               General ADL Comments: patient was able to stand up in STEDY with mod A with increased time. patient was able to engage in taking one hand off at a time to touch head and then bottom to work on standing balance with CGA with no buckling of knees in STEDY. patient completed 5 sit to stands with STEDY with mod A to stand and min A for landing in bed. patient refused to get into chair on this date for lunch even with education.      Cognition Arousal: Alert Behavior During Therapy: WFL for tasks assessed/performed Overall Cognitive Status: Within Functional Limits for tasks assessed                       Pertinent Vitals/ Pain       Pain Assessment Pain Assessment: Faces Faces Pain Scale: Hurts a little bit Pain Location: ABD Pain Descriptors / Indicators: Sore, Operative site guarding, Grimacing Pain Intervention(s): Limited activity within patient's tolerance, Monitored during session         Frequency  Min 1X/week        Progress Toward Goals  OT Goals(current goals can now be found in the care plan section)  Progress towards OT goals: Progressing toward goals     Plan  AM-PAC OT 6 Clicks Daily Activity     Outcome Measure   Help from another person eating meals?: A Little Help from another person taking care of personal grooming?: A Lot Help from another person toileting, which includes using toliet, bedpan, or urinal?: A Lot Help from another person bathing (including washing, rinsing, drying)?: A Lot Help from another person to put on and taking off regular upper body clothing?: A Little Help from another person to put on and taking off regular  lower body clothing?: A Lot 6 Click Score: 14    End of Session Equipment Utilized During Treatment: Gait belt;Other (comment) (STEDY)  OT Visit Diagnosis: Unsteadiness on feet (R26.81);Other abnormalities of gait and mobility (R26.89)   Activity Tolerance Patient tolerated treatment well   Patient Left in bed;with call bell/phone within reach   Nurse Communication Mobility status        Time: 8795-8784 OT Time Calculation (min): 11 min  Charges: OT General Charges $OT Visit: 1 Visit OT Treatments $Therapeutic Activity: 8-22 mins  Geofm LEYLAND, MS Acute Rehabilitation Department Office# [phone]   Geofm CHRISTELLA Dance 10/06/2023, 12:35 PM

## 2023-10-07 DIAGNOSIS — K56609 Unspecified intestinal obstruction, unspecified as to partial versus complete obstruction: Secondary | ICD-10-CM | POA: Diagnosis not present

## 2023-10-07 LAB — BASIC METABOLIC PANEL
Anion gap: 6 (ref 5–15)
BUN: 14 mg/dL (ref 8–23)
CO2: 19 mmol/L — ABNORMAL LOW (ref 22–32)
Calcium: 8.2 mg/dL — ABNORMAL LOW (ref 8.9–10.3)
Chloride: 107 mmol/L (ref 98–111)
Creatinine, Ser: 1.04 mg/dL (ref 0.61–1.24)
GFR, Estimated: >60 mL/min (ref >60)
Glucose, Bld: 80 mg/dL (ref 70–99)
Potassium: 4.1 mmol/L (ref 3.5–5.1)
Sodium: 132 mmol/L — ABNORMAL LOW (ref 135–145)

## 2023-10-07 MED ORDER — DOCUSATE SODIUM 100 MG PO CAPS: 100.0000 mg | ORAL_CAPSULE | Freq: Two times a day (BID) | ORAL | Status: DC | PRN

## 2023-10-07 NOTE — Progress Notes (Signed)
 Physical Therapy Treatment Patient Details Name: Antonio Serrano MRN: [id number] DOB: [birth year] Today's Date: 10/07/2023   History of Present Illness Patient is a 78 year old male who presented with mild crampy abdominal pain and more than 10 episodes of vomiting.  In the ER patient's CAT scan is concerning for small bowel obstruction, patient has had nasogastric tube placed. General surgery was consulted.  Due to lack of improvement, patient underwent diagnostic laparoscopy with lysis of adhesion, laparoscopic appendectomy by Dr. Sheldon on 12/29. PMH: TIA, HTN, THA, CKD III    PT Comments  Pt seen for PT tx with pt agreeable. Pt is able to come to sitting EOB with supervision & hospital bed features but unable to scoot forward to edge of seat without assistance. Pt completes multiple STS transfers from EOB & recliner with RW & max assist. Pt limited during transfers & standing/gait attempts by posterior lean with pt demonstrating decreased ability to shift weight anteriorly. Pt does attempt gait but is only able to shuffle forwards ~1-2 ft x 2 attempts before requiring seated rest break. Will continue to follow pt acutely to address balance, endurance, transfers, & gait with LRAD.   If plan is discharge home, recommend the following: A lot of help with walking and/or transfers;A lot of help with bathing/dressing/bathroom;Help with stairs or ramp for entrance;Assist for transportation;Assistance with cooking/housework   Can travel by private vehicle     No  Equipment Recommendations  None recommended by PT    Recommendations for Other Services       Precautions / Restrictions Precautions Precautions: Fall Precaution Comments: recent ABD surgery Restrictions Weight Bearing Restrictions Per Provider Order: No     Mobility  Bed Mobility Overal bed mobility: Needs Assistance Bed Mobility: Supine to Sit     Supine to sit: Supervision, Used rails, HOB elevated     General bed mobility  comments: Pt is able to transfer semi-fowler to sitting EOB with extra time, supervision, cuing use bed rails but requires up to max assist to scoot to sitting EOB.    Transfers Overall transfer level: Needs assistance Equipment used: Rolling walker (2 wheels) Transfers: Sit to/from Stand, Bed to chair/wheelchair/BSC Sit to Stand: Max assist   Step pivot transfers: Mod assist, Max assist       General transfer comment: STS from EOB x3, STS from recliner x3. Pt with difficulty & decreased ability to scoot to sitting EOB. Pt requires cuing re: hand placement to push up to standing & reach back for seat, max assist to power up to standing; pt limited by posterior lean with decreased ability to shift weight anteriorly during transfer.    Ambulation/Gait Ambulation/Gait assistance: Max assist Gait Distance (Feet):  (1-2 ft x 2 attempts) Assistive device: Rolling walker (2 wheels) Gait Pattern/deviations: Shuffle, Decreased weight shift to left, Decreased stance time - left, Decreased stride length, Decreased step length - right, Decreased step length - left, Decreased dorsiflexion - left, Decreased dorsiflexion - right Gait velocity: decreased     General Gait Details: Pt with narrow step width RLE, cuing to widen step width RLE. Pt able to somewhat clear floor with RLE when stepping but unable to clear L foot, instead sliding it forwards but with decreased ability to do so.   Stairs             Wheelchair Mobility     Tilt Bed    Modified Rankin (Stroke Patients Only)       Balance Overall balance assessment:  Needs assistance Sitting-balance support: Feet supported, No upper extremity supported Sitting balance-Leahy Scale: Fair     Standing balance support: During functional activity, Bilateral upper extremity supported, Reliant on assistive device for balance Standing balance-Leahy Scale: Poor                              Cognition Arousal:  Alert Behavior During Therapy: Flat affect Overall Cognitive Status: Within Functional Limits for tasks assessed                                 General Comments: somewhat irritable upon PT arrival but agreeable & willing to participate        Exercises      General Comments General comments (skin integrity, edema, etc.): Pt with incontinent BM smear, requires total assist for peri hygiene. Requires assistance to don bedroom slippers while sitting EOB.      Pertinent Vitals/Pain Pain Assessment Pain Assessment: Faces Faces Pain Scale: No hurt    Home Living                          Prior Function            PT Goals (current goals can now be found in the care plan section) Acute Rehab PT Goals PT Goal Formulation: With patient Time For Goal Achievement: 10/15/23 Potential to Achieve Goals: Fair Progress towards PT goals: Progressing toward goals    Frequency    Min 1X/week      PT Plan      Co-evaluation              AM-PAC PT 6 Clicks Mobility   Outcome Measure  Help needed turning from your back to your side while in a flat bed without using bedrails?: A Lot Help needed moving from lying on your back to sitting on the side of a flat bed without using bedrails?: A Lot Help needed moving to and from a bed to a chair (including a wheelchair)?: Total Help needed standing up from a chair using your arms (e.g., wheelchair or bedside chair)?: Total Help needed to walk in hospital room?: Total Help needed climbing 3-5 steps with a railing? : Total 6 Click Score: 8    End of Session Equipment Utilized During Treatment: Gait belt Activity Tolerance: Patient tolerated treatment well;Patient limited by fatigue Patient left: in chair;with call bell/phone within reach Nurse Communication: Mobility status PT Visit Diagnosis: Other abnormalities of gait and mobility (R26.89);Muscle weakness (generalized) (M62.81);Difficulty in walking,  not elsewhere classified (R26.2);Unsteadiness on feet (R26.81)     Time: 8986-8970 PT Time Calculation (min) (ACUTE ONLY): 16 min  Charges:    $Therapeutic Activity: 8-22 mins PT General Charges $$ ACUTE PT VISIT: 1 Visit                     Richerd Pinal, PT, DPT 10/07/23, 11:20 AM   Richerd CHRISTELLA Pinal 10/07/2023, 11:19 AM

## 2023-10-07 NOTE — TOC Progression Note (Addendum)
 Transition of Care [facility]) - Progression Note    Patient Details  Name: Antonio Serrano MRN: [id number] Date of Birth: 1946-08-31  [facility] CM/SW Contact  Alfonse JONELLE Rex, RN Phone Number: 10/07/2023, 11:04 AM  Clinical Narrative:   Short term rehab auth initiated via Lexine Barrows ID [id number], pending barrows.   -11:05am Call to Allean, admissions coordinator at [facility], informed pt accepted short term rehab bed offer and auth initiated. TOC will continue to follow.,     Expected Discharge Plan: Skilled Nursing Facility Barriers to Discharge: Insurance Authorization, SNF Pending bed offer  Expected Discharge Plan and Services In-house Referral: Clinical Social Work   Post Acute Care Choice: Home Health Living arrangements for the past 2 months: Mobile Home                 DME Arranged: N/A DME Agency: NA       HH Arranged: PT, OT   Date HH Agency Contacted: 10/01/23 Time HH Agency Contacted: 1445 Representative spoke with at [facility] Agency: [facility]   Social Determinants of Health (SDOH) Interventions SDOH Screenings   Food Insecurity: No Food Insecurity (09/25/2023)  Housing: Low Risk  (09/25/2023)  Transportation Needs: No Transportation Needs (09/25/2023)  Utilities: Not At Risk (09/25/2023)  Alcohol  Screen: Low Risk  (11/16/2020)  Depression (PHQ2-9): Medium Risk (07/03/2023)  Financial Resource Strain: Low Risk  (11/28/2022)  Physical Activity: Inactive (08/28/2023)  Social Connections: Moderately Isolated (10/01/2023)  Stress: No Stress Concern Present (11/28/2022)  Tobacco Use: High Risk (09/25/2023)  Health Literacy: Adequate Health Literacy (08/28/2023)    Readmission Risk Interventions    10/01/2023    2:54 PM 06/19/2023   10:18 AM 06/18/2023   10:27 AM  Readmission Risk Prevention Plan  Transportation Screening Complete Complete Complete  PCP or Specialist Appt within 5-7 Days  Complete Complete  Home Care Screening  Complete  Complete  Medication Review (RN CM)  Complete Complete  HRI or Home Care Consult Complete    Social Work Consult for Recovery Care Planning/Counseling Complete    Palliative Care Screening Not Applicable    Medication Review Oceanographer) Complete

## 2023-10-07 NOTE — Progress Notes (Signed)
 PROGRESS NOTE    Antonio Serrano  FMW:[id number] DOB: 07-Sep-1946 DOA: 09/25/2023 PCP: Antonio Aleene DEL, MD     Brief Narrative:  Antonio Serrano is a 78 y.o. male with medical history significant of no reported intra-abdominal surgery, presented complaining of mild onset of crampy abdominal pain and more than 10 episodes of vomiting for few days prior to presentation. Patient has been unable to tolerate p.o. diet. In the ER patient's CT A/P was concerning for SBO. General surgery was consulted.  Due to lack of improvement, patient underwent diagnostic laparoscopy with lysis of adhesion, laparoscopic appendectomy by Dr. Sheldon on 12/29.   New events last 24 hours / Subjective: Wants to go to rehab right away.. Denies any new compaints, except for loose stool    Assessment & Plan:   Principal Problem:   SBO (small bowel obstruction) (HCC) Active Problems:   Chronic renal insufficiency, stage III (moderate) (HCC)   Type 2 diabetes mellitus with stage 3a chronic kidney disease, without long-term current use of insulin  (HCC)   Constipation, chronic   Hyponatremia   Leukocytosis   AKI (acute kidney injury) (HCC)   Hypokalemia   MRSA (methicillin resistant Staphylococcus aureus) colonization   Need for emotional support   Goals of care, counseling/discussion   Small bowel obstruction (HCC)   Palliative care encounter   Counseling and coordination of care  SBO Status post laparoscopic lysis of adhesion, laparoscopic appendectomy on 12/29 Removed NG tube on 12/31, advanced diet General Surgery signed off on 10/04/2023  AKI Baseline creatinine 1.3 Resolved with IV fluid  MRSA in urine Urine culture from 12/25 showed MRSA. Patient has no urinary symptoms, fever, blood cultures are negative. No systemic signs of infection. Repeat UA on 12/27 is negative. Discussed with ID on call. In setting of no systemic signs and symptoms, this is likely a contaminant. Monitor.    Hypokalemia Replaced prn  Prior history of CVA Continue Plavix   Hypertension BP stable, continue to hold Norvasc   Goals of care discussion Palliative consult as requested by family Difficult family dynamics noted after extensively discussing with patient's son and daughter-in-law on 10/02/2023.  Reported patient mostly spends time in his recliner/bed, refusing to ambulate, go to appointments with poor oral intake.  Have been working with his PCP to establish care for patient in a nursing home as they are unable to continue to take care of him.  Family had multiple complaints including patient's inability to ambulate and a questionable diagnosis of Parkinson's disease of which patient had been referred to neurology outpatient, but refuses to go for any appointments. Appreciate palliative recs Patient is noted to have capacity to make informed decisions.      DVT prophylaxis:  heparin  injection 5,000 Units Start: 09/26/23 0600 SCDs Start: 09/25/23 2127  Code Status: Full code Family Communication: Discussed extensively with daughter-in-law on 10/04/2023 over the phone Disposition Plan: SNF Status is: Inpatient Remains inpatient appropriate because: Level of care    Antimicrobials:  Anti-infectives (From admission, onward)    Start     Dose/Rate Route Frequency Ordered Stop   09/29/23 2200  cefoTEtan  (CEFOTAN ) 2 g in sodium chloride  0.9 % 100 mL IVPB        2 g 200 mL/hr over 30 Minutes Intravenous Every 12 hours 09/29/23 1313 09/30/23 2033   09/29/23 1215  cefoTEtan  (CEFOTAN ) 2 g in sodium chloride  0.9 % 100 mL IVPB        2 g 200 mL/hr over 30 Minutes Intravenous On call  to O.R. 09/29/23 0759 09/30/23 2033   09/29/23 1200  vancomycin  (VANCOCIN ) IVPB 1000 mg/200 mL premix       Note to Pharmacy: Pharmacy may adjust dosing strength, interval, or rate of medication as needed for optimal therapy for the patient  Send with patient on call to the OR.  Anesthesia to complete  antibiotic administration <46min prior to incision per [facility].   1,000 mg 200 mL/hr over 60 Minutes Intravenous On call to O.R. 09/29/23 0748 09/29/23 1055   09/29/23 0923  sodium chloride  0.9 % with cefoTEtan  (CEFOTAN ) ADS Med       Note to Pharmacy: [facility]: cabinet override      09/29/23 0923 09/29/23 1028   09/29/23 0923  vancomycin  (VANCOCIN ) 1-5 GM/200ML-% IVPB       Note to Pharmacy: [facility] M: cabinet override      09/29/23 0923 09/29/23 1028   09/25/23 2200  metroNIDAZOLE  (FLAGYL ) IVPB 500 mg  Status:  Discontinued        500 mg 100 mL/hr over 60 Minutes Intravenous Every 12 hours 09/25/23 2059 09/26/23 1142   09/25/23 2115  cefTRIAXone  (ROCEPHIN ) 2 g in sodium chloride  0.9 % 100 mL IVPB  Status:  Discontinued        2 g 200 mL/hr over 30 Minutes Intravenous Every 24 hours 09/25/23 2059 09/26/23 1142        Objective: Vitals:   10/06/23 0435 10/06/23 1308 10/07/23 0531 10/07/23 1310  BP: (!) 131/53 131/71 (!) 114/58 116/70  Pulse: 67 68 67 72  Resp: 16 16 18 17   Temp: 98 F (36.7 C) 97.7 F (36.5 C) 97.6 F (36.4 C) 97.7 F (36.5 C)  TempSrc: Oral Oral Oral Oral  SpO2: 97% 100% 98% 100%  Weight:      Height:        Intake/Output Summary (Last 24 hours) at 10/07/2023 1334 Last data filed at 10/07/2023 1319 Gross per 24 hour  Intake 840 ml  Output 400 ml  Net 440 ml   Filed Weights   09/25/23 1516  Weight: 72.6 kg    Examination:  General: NAD  Cardiovascular: S1, S2 present Respiratory: CTAB Abdomen: Soft, nontender, nondistended, bowel sounds present Musculoskeletal: No bilateral pedal edema noted Skin: Normal Psychiatry: Normal mood   Data Reviewed: I have personally reviewed following labs and imaging studies  CBC: Recent Labs  Lab 10/01/23 0420 10/02/23 0438 10/06/23 1007  WBC 6.9 7.0 9.3  HGB 12.7* 12.9* 12.9*  HCT 37.3* 37.4* 37.2*  MCV 88.4 88.6 86.7  PLT 244 249 327   Basic Metabolic Panel: Recent Labs  Lab  10/01/23 0420 10/02/23 0438 10/06/23 1007 10/07/23 0432  NA 130* 132* 132* 132*  K 3.4* 3.8 3.1* 4.1  CL 98 102 101 107  CO2 23 22 21* 19*  GLUCOSE 68* 115* 82 80  BUN 13 11 14 14   CREATININE 1.12 1.10 0.95 1.04  CALCIUM  8.0* 8.1* 8.2* 8.2*  MG  --  1.7 1.7  --    GFR: Estimated Creatinine Clearance: 55.4 mL/min (by C-G formula based on SCr of 1.04 mg/dL). Liver Function Tests: No results for input(s): AST, ALT, ALKPHOS, BILITOT, PROT, ALBUMIN in the last 168 hours.  No results for input(s): LIPASE, AMYLASE in the last 168 hours. No results for input(s): AMMONIA in the last 168 hours. Coagulation Profile: No results for input(s): INR, PROTIME in the last 168 hours.  Cardiac Enzymes: No results for input(s): CKTOTAL, CKMB, CKMBINDEX, TROPONINI  in the last 168 hours. BNP (last 3 results) No results for input(s): PROBNP in the last 8760 hours. HbA1C: No results for input(s): HGBA1C in the last 72 hours. CBG: No results for input(s): GLUCAP in the last 168 hours.  Lipid Profile: No results for input(s): CHOL, HDL, LDLCALC, TRIG, CHOLHDL, LDLDIRECT in the last 72 hours. Thyroid  Function Tests: No results for input(s): TSH, T4TOTAL, FREET4, T3FREE, THYROIDAB in the last 72 hours. Anemia Panel: No results for input(s): VITAMINB12, FOLATE, FERRITIN, TIBC, IRON, RETICCTPCT in the last 72 hours. Sepsis Labs: No results for input(s): PROCALCITON, LATICACIDVEN in the last 168 hours.   Recent Results (from the past 240 hours)  Surgical pcr screen     Status: Abnormal   Collection Time: 09/29/23  8:19 AM   Specimen: Nasal Mucosa; Nasal Swab  Result Value Ref Range Status   MRSA, PCR POSITIVE (A) NEGATIVE Final   Staphylococcus aureus POSITIVE (A) NEGATIVE Final    Comment: (NOTE) The Xpert SA Assay (FDA approved for NASAL specimens in patients 63 years of age and older), is one component of a  comprehensive surveillance program. It is not intended to diagnose infection nor to guide or monitor treatment. Performed at [facility], [address], [place], [place] [address]       Radiology Studies: No results found.     Scheduled Meds:  bisacodyl   10 mg Rectal Daily   clopidogrel   75 mg Oral Daily   docusate sodium   100 mg Oral BID   heparin   5,000 Units Subcutaneous Q8H   pantoprazole   40 mg Oral BID   sodium chloride  flush  3-10 mL Intravenous Q12H   Continuous Infusions:     LOS: 12 days     Lebron JINNY Cage, MD [facility] 10/07/2023, 1:34 PM   Available via Epic secure chat 7am-7pm After these hours, please refer to coverage provider listed on [email or web address]

## 2023-10-07 NOTE — Plan of Care (Signed)
   Problem: Clinical Measurements: Goal: Ability to maintain clinical measurements within normal limits will improve Outcome: Progressing   Problem: Activity: Goal: Risk for activity intolerance will decrease Outcome: Progressing

## 2023-10-08 DIAGNOSIS — K56609 Unspecified intestinal obstruction, unspecified as to partial versus complete obstruction: Secondary | ICD-10-CM | POA: Diagnosis not present

## 2023-10-08 LAB — BASIC METABOLIC PANEL
Anion gap: 7 (ref 5–15)
BUN: 16 mg/dL (ref 8–23)
CO2: 20 mmol/L — ABNORMAL LOW (ref 22–32)
Calcium: 8.3 mg/dL — ABNORMAL LOW (ref 8.9–10.3)
Chloride: 103 mmol/L (ref 98–111)
Creatinine, Ser: 1.08 mg/dL (ref 0.61–1.24)
GFR, Estimated: >60 mL/min (ref >60)
Glucose, Bld: 138 mg/dL — ABNORMAL HIGH (ref 70–99)
Potassium: 3.6 mmol/L (ref 3.5–5.1)
Sodium: 130 mmol/L — ABNORMAL LOW (ref 135–145)

## 2023-10-08 LAB — CBC
HCT: 38.5 % — ABNORMAL LOW (ref 39.0–52.0)
Hemoglobin: 13.4 g/dL (ref 13.0–17.0)
MCH: 30.2 pg (ref 26.0–34.0)
MCHC: 34.8 g/dL (ref 30.0–36.0)
MCV: 86.9 fL (ref 80.0–100.0)
Platelets: 389 10*3/uL (ref 150–400)
RBC: 4.43 MIL/uL (ref 4.22–5.81)
RDW: 15.4 % (ref 11.5–15.5)
WBC: 10.5 10*3/uL (ref 4.0–10.5)
nRBC: 0 % (ref 0.0–0.2)

## 2023-10-08 NOTE — Plan of Care (Signed)
  Problem: Clinical Measurements: Goal: Ability to maintain clinical measurements within normal limits will improve Outcome: Progressing   Problem: Nutrition: Goal: Adequate nutrition will be maintained Outcome: Progressing   

## 2023-10-08 NOTE — TOC Progression Note (Addendum)
 Transition of Care [facility]) - Progression Note    Patient Details  Name: Antonio Serrano MRN: [id number] Date of Birth: Mar 27, 1946  Transition of Care [facility] CM/SW Contact  Alfonse JONELLE Rex, RN Phone Number: 10/08/2023, 10:40 AM  Clinical Narrative:   Per [employer]-auth still pending. TOC will continue to follow.   -3:09pm Auth still pending    Expected Discharge Plan: Skilled Nursing Facility Barriers to Discharge: English As A Second Language Teacher, SNF Pending bed offer  Expected Discharge Plan and Services In-house Referral: Clinical Social Work   Post Acute Care Choice: Home Health Living arrangements for the past 2 months: Mobile Home                 DME Arranged: N/A DME Agency: NA       HH Arranged: PT, OT   Date HH Agency Contacted: 10/01/23 Time HH Agency Contacted: 1445 Representative spoke with at [facility] Agency: [facility]   Social Determinants of Health (SDOH) Interventions SDOH Screenings   Food Insecurity: No Food Insecurity (09/25/2023)  Housing: Low Risk  (09/25/2023)  Transportation Needs: No Transportation Needs (09/25/2023)  Utilities: Not At Risk (09/25/2023)  Alcohol  Screen: Low Risk  (11/16/2020)  Depression (PHQ2-9): Medium Risk (07/03/2023)  Financial Resource Strain: Low Risk  (11/28/2022)  Physical Activity: Inactive (08/28/2023)  Social Connections: Moderately Isolated (10/01/2023)  Stress: No Stress Concern Present (11/28/2022)  Tobacco Use: High Risk (09/25/2023)  Health Literacy: Adequate Health Literacy (08/28/2023)    Readmission Risk Interventions    10/01/2023    2:54 PM 06/19/2023   10:18 AM 06/18/2023   10:27 AM  Readmission Risk Prevention Plan  Transportation Screening Complete Complete Complete  PCP or Specialist Appt within 5-7 Days  Complete Complete  Home Care Screening  Complete Complete  Medication Review (RN CM)  Complete Complete  HRI or Home Care Consult Complete    Social Work Consult for Recovery Care Planning/Counseling Complete     Palliative Care Screening Not Applicable    Medication Review [employer]) Complete

## 2023-10-08 NOTE — Progress Notes (Signed)
 PROGRESS NOTE    Antonio Serrano  FMW:[id number] DOB: 12-08-1945 DOA: 09/25/2023 PCP: Candise Aleene DEL, MD     Brief Narrative:  Antonio Serrano is a 78 y.o. male with medical history significant of no reported intra-abdominal surgery, presented complaining of mild onset of crampy abdominal pain and more than 10 episodes of vomiting for few days prior to presentation. Patient has been unable to tolerate p.o. diet. In the ER patient's CT A/P was concerning for SBO. General surgery was consulted.  Due to lack of improvement, patient underwent diagnostic laparoscopy with lysis of adhesion, laparoscopic appendectomy by Dr. Sheldon on 12/29.   New events last 24 hours / Subjective: Today, patient continues to have multiple episodes of loose stools.  Denies any abdominal pain, nausea/vomiting, fever/chills.  Possibly 2/2 antibiotic use as well as recently prescribed stool softeners.     Assessment & Plan:   Principal Problem:   SBO (small bowel obstruction) (HCC) Active Problems:   Chronic renal insufficiency, stage III (moderate) (HCC)   Type 2 diabetes mellitus with stage 3a chronic kidney disease, without long-term current use of insulin  (HCC)   Constipation, chronic   Hyponatremia   Leukocytosis   AKI (acute kidney injury) (HCC)   Hypokalemia   MRSA (methicillin resistant Staphylococcus aureus) colonization   Need for emotional support   Goals of care, counseling/discussion   Small bowel obstruction [facility])   Palliative care encounter   Counseling and coordination of care  SBO Status post laparoscopic lysis of adhesion, laparoscopic appendectomy on 12/29 Removed NG tube on 12/31, advanced diet General Surgery signed off on 10/04/2023  AKI Baseline creatinine 1.3 Resolved with IV fluid  MRSA in urine Urine culture from 12/25 showed MRSA. Patient has no urinary symptoms, fever, blood cultures are negative. No systemic signs of infection. Repeat UA on 12/27 is negative. Discussed with  ID on call. In setting of no systemic signs and symptoms, this is likely a contaminant. Monitor.   Diarrhea Denies any abdominal pain, N/V Afebrile, with no leukocytosis Likely 2/2 recent antibiotic use in addition to recently prescribed stool softeners/laxatives Stool GI panel pending If persistent, with systemic signs of infection, may check C. Difficile As needed Imodium  once GI panel has resulted Completed antibiotics and no longer on any stool softeners  Hypokalemia Replaced prn  Prior history of CVA Continue Plavix   Hypertension BP stable, continue to hold Norvasc   Goals of care discussion Palliative consult as requested by family Difficult family dynamics noted after extensively discussing with patient's son and daughter-in-law on 10/02/2023.  Reported patient mostly spends time in his recliner/bed, refusing to ambulate, go to appointments with poor oral intake.  Have been working with his PCP to establish care for patient in a nursing home as they are unable to continue to take care of him.  Family had multiple complaints including patient's inability to ambulate and a questionable diagnosis of Parkinson's disease of which patient had been referred to neurology outpatient, but refuses to go for any appointments. Appreciate palliative recs Patient is noted to have capacity to make informed decisions.      DVT prophylaxis:  heparin  injection 5,000 Units Start: 09/26/23 0600 SCDs Start: 09/25/23 2127  Code Status: Full code Family Communication: Discussed extensively with daughter-in-law on 10/04/2023 over the phone Disposition Plan: SNF Status is: Inpatient Remains inpatient appropriate because: Level of care    Antimicrobials:  Anti-infectives (From admission, onward)    Start     Dose/Rate Route Frequency Ordered Stop   09/29/23  2200  cefoTEtan  (CEFOTAN ) 2 g in sodium chloride  0.9 % 100 mL IVPB        2 g 200 mL/hr over 30 Minutes Intravenous Every 12 hours  09/29/23 1313 09/30/23 2033   09/29/23 1215  cefoTEtan  (CEFOTAN ) 2 g in sodium chloride  0.9 % 100 mL IVPB        2 g 200 mL/hr over 30 Minutes Intravenous On call to O.R. 09/29/23 0759 09/30/23 2033   09/29/23 1200  vancomycin  (VANCOCIN ) IVPB 1000 mg/200 mL premix       Note to Pharmacy: Pharmacy may adjust dosing strength, interval, or rate of medication as needed for optimal therapy for the patient  Send with patient on call to the OR.  Anesthesia to complete antibiotic administration <77min prior to incision per [facility]   1,000 mg 200 mL/hr over 60 Minutes Intravenous On call to O.R. 09/29/23 0748 09/29/23 1055   09/29/23 0923  sodium chloride  0.9 % with cefoTEtan  (CEFOTAN ) ADS Med       Note to Pharmacy: [facility]: cabinet override      09/29/23 0923 09/29/23 1028   09/29/23 0923  vancomycin  (VANCOCIN ) 1-5 GM/200ML-% IVPB       Note to Pharmacy: [facility] M: cabinet override      09/29/23 0923 09/29/23 1028   09/25/23 2200  metroNIDAZOLE  (FLAGYL ) IVPB 500 mg  Status:  Discontinued        500 mg 100 mL/hr over 60 Minutes Intravenous Every 12 hours 09/25/23 2059 09/26/23 1142   09/25/23 2115  cefTRIAXone  (ROCEPHIN ) 2 g in sodium chloride  0.9 % 100 mL IVPB  Status:  Discontinued        2 g 200 mL/hr over 30 Minutes Intravenous Every 24 hours 09/25/23 2059 09/26/23 1142        Objective: Vitals:   10/07/23 0531 10/07/23 1310 10/07/23 2132 10/08/23 0449  BP: (!) 114/58 116/70 129/65 136/80  Pulse: 67 72 74 75  Resp: 18 17 18 16   Temp: 97.6 F (36.4 C) 97.7 F (36.5 C) 97.9 F (36.6 C) 97.7 F (36.5 C)  TempSrc: Oral Oral Oral Oral  SpO2: 98% 100% 100% 96%  Weight:      Height:        Intake/Output Summary (Last 24 hours) at 10/08/2023 1733 Last data filed at 10/08/2023 1400 Gross per 24 hour  Intake 870 ml  Output 600 ml  Net 270 ml   Filed Weights   09/25/23 1516  Weight: 72.6 kg    Examination:  General: NAD  Cardiovascular: S1, S2  present Respiratory: CTAB Abdomen: Soft, nontender, nondistended, bowel sounds present Musculoskeletal: No bilateral pedal edema noted Skin: Normal Psychiatry: Normal mood   Data Reviewed: I have personally reviewed following labs and imaging studies  CBC: Recent Labs  Lab 10/02/23 0438 10/06/23 1007 10/08/23 1053  WBC 7.0 9.3 10.5  HGB 12.9* 12.9* 13.4  HCT 37.4* 37.2* 38.5*  MCV 88.6 86.7 86.9  PLT 249 327 389   Basic Metabolic Panel: Recent Labs  Lab 10/02/23 0438 10/06/23 1007 10/07/23 0432 10/08/23 1053  NA 132* 132* 132* 130*  K 3.8 3.1* 4.1 3.6  CL 102 101 107 103  CO2 22 21* 19* 20*  GLUCOSE 115* 82 80 138*  BUN 11 14 14 16   CREATININE 1.10 0.95 1.04 1.08  CALCIUM  8.1* 8.2* 8.2* 8.3*  MG 1.7 1.7  --   --    GFR: Estimated Creatinine Clearance: 53.3 mL/min (by C-G formula based  on SCr of 1.08 mg/dL). Liver Function Tests: No results for input(s): AST, ALT, ALKPHOS, BILITOT, PROT, ALBUMIN in the last 168 hours.  No results for input(s): LIPASE, AMYLASE in the last 168 hours. No results for input(s): AMMONIA in the last 168 hours. Coagulation Profile: No results for input(s): INR, PROTIME in the last 168 hours.  Cardiac Enzymes: No results for input(s): CKTOTAL, CKMB, CKMBINDEX, TROPONINI in the last 168 hours. BNP (last 3 results) No results for input(s): PROBNP in the last 8760 hours. HbA1C: No results for input(s): HGBA1C in the last 72 hours. CBG: No results for input(s): GLUCAP in the last 168 hours.  Lipid Profile: No results for input(s): CHOL, HDL, LDLCALC, TRIG, CHOLHDL, LDLDIRECT in the last 72 hours. Thyroid  Function Tests: No results for input(s): TSH, T4TOTAL, FREET4, T3FREE, THYROIDAB in the last 72 hours. Anemia Panel: No results for input(s): VITAMINB12, FOLATE, FERRITIN, TIBC, IRON, RETICCTPCT in the last 72 hours. Sepsis Labs: No results for input(s):  PROCALCITON, LATICACIDVEN in the last 168 hours.   Recent Results (from the past 240 hours)  Surgical pcr screen     Status: Abnormal   Collection Time: 09/29/23  8:19 AM   Specimen: Nasal Mucosa; Nasal Swab  Result Value Ref Range Status   MRSA, PCR POSITIVE (A) NEGATIVE Final   Staphylococcus aureus POSITIVE (A) NEGATIVE Final    Comment: (NOTE) The Xpert SA Assay (FDA approved for NASAL specimens in patients 68 years of age and older), is one component of a comprehensive surveillance program. It is not intended to diagnose infection nor to guide or monitor treatment. Performed at [facility], [address], [place], [place] [address]       Radiology Studies: No results found.     Scheduled Meds:  clopidogrel   75 mg Oral Daily   heparin   5,000 Units Subcutaneous Q8H   pantoprazole   40 mg Oral BID   sodium chloride  flush  3-10 mL Intravenous Q12H   Continuous Infusions:     LOS: 13 days     Lebron JINNY Cage, MD [facility] 10/08/2023, 5:33 PM   Available via Epic secure chat 7am-7pm After these hours, please refer to coverage provider listed on [email or web address]

## 2023-10-09 DIAGNOSIS — K56609 Unspecified intestinal obstruction, unspecified as to partial versus complete obstruction: Secondary | ICD-10-CM | POA: Diagnosis not present

## 2023-10-09 LAB — BASIC METABOLIC PANEL
Anion gap: 9 (ref 5–15)
BUN: 16 mg/dL (ref 8–23)
CO2: 18 mmol/L — ABNORMAL LOW (ref 22–32)
Calcium: 8 mg/dL — ABNORMAL LOW (ref 8.9–10.3)
Chloride: 105 mmol/L (ref 98–111)
Creatinine, Ser: 1.17 mg/dL (ref 0.61–1.24)
GFR, Estimated: >60 mL/min (ref >60)
Glucose, Bld: 90 mg/dL (ref 70–99)
Potassium: 3.5 mmol/L (ref 3.5–5.1)
Sodium: 132 mmol/L — ABNORMAL LOW (ref 135–145)

## 2023-10-09 LAB — GASTROINTESTINAL PANEL BY PCR, STOOL (REPLACES STOOL CULTURE)
Adenovirus F40/41: DETECTED — AB
Astrovirus: NOT DETECTED
Campylobacter species: NOT DETECTED
Cryptosporidium: NOT DETECTED
Cyclospora cayetanensis: NOT DETECTED
Entamoeba histolytica: NOT DETECTED
Enteroaggregative E coli (EAEC): NOT DETECTED
Enteropathogenic E coli (EPEC): NOT DETECTED
Enterotoxigenic E coli (ETEC): NOT DETECTED
Giardia lamblia: NOT DETECTED
Norovirus GI/GII: DETECTED — AB
Plesimonas shigelloides: NOT DETECTED
Rotavirus A: NOT DETECTED
Salmonella species: NOT DETECTED
Sapovirus (I, II, IV, and V): NOT DETECTED
Shiga like toxin producing E coli (STEC): NOT DETECTED
Shigella/Enteroinvasive E coli (EIEC): NOT DETECTED
Vibrio cholerae: NOT DETECTED
Vibrio species: NOT DETECTED
Yersinia enterocolitica: NOT DETECTED

## 2023-10-09 LAB — CBC
HCT: 34.9 % — ABNORMAL LOW (ref 39.0–52.0)
Hemoglobin: 12.3 g/dL — ABNORMAL LOW (ref 13.0–17.0)
MCH: 30.2 pg (ref 26.0–34.0)
MCHC: 35.2 g/dL (ref 30.0–36.0)
MCV: 85.7 fL (ref 80.0–100.0)
Platelets: 359 10*3/uL (ref 150–400)
RBC: 4.07 MIL/uL — ABNORMAL LOW (ref 4.22–5.81)
RDW: 15.3 % (ref 11.5–15.5)
WBC: 8.9 10*3/uL (ref 4.0–10.5)
nRBC: 0 % (ref 0.0–0.2)

## 2023-10-09 NOTE — Hospital Course (Signed)
 Antonio Serrano is a 78 y.o. male with medical history significant of no reported intra-abdominal surgery, presented hospital with crampy abdominal pain with vomiting for 2 days prior to presentation to the hospital.  In the ED, CT A/P was concerning for SBO. General surgery was consulted.  Due to lack of improvement, patient underwent diagnostic laparoscopy with lysis of adhesion, laparoscopic appendectomy by Dr. Sheldon on 12/29.     SBO Status post laparoscopic lysis of adhesion, laparoscopic appendectomy on 12/29.  Status post NG tube removal 1231.  Has been advanced on oral diet and general surgery has signed off since 10/04/2023.    AKI Baseline creatinine 1.3, latest creatinine of 1.1.  Has resolved with IV fluids.   MRSA in urine Urine culture from 12/25 showed MRSA.  Did not have any urinary symptoms, fever, blood cultures are negative. No systemic signs of infection. Repeat UA on 12/27 was negative.  Previous provider had discussed with ID on call. In setting of no systemic signs and symptoms, this is likely a contaminant.    Diarrhea Likely secondary to recent antibiotic usage with stool softeners laxative.  GI pathogen panel pending. As needed Imodium  once GI panel has resulted has completed antibiotic.    Hypokalemia Improved after replacement.  Latest potassium of 3.5.   Prior history of CVA Continue Plavix    Hypertension Norvasc  on hold.  Blood pressure seems to be stable.   Goals of care discussion Family wished for palliative care consultation.  Reported that patient mostly spends time in his recliner/bed, refusing to ambulate, go to appointments with poor oral intake.  Have been working with his PCP to establish care for patient in a nursing home as they are unable to continue to take care of him.  Family had multiple complaints including patient's inability to ambulate and a questionable diagnosis of Parkinson's disease of which patient had been referred to neurology  outpatient, but refuses to go for any appointments. Patient is noted to have capacity to make informed decisions.  Deconditioning debility.  At this time plan is for skilled nursing facility placement.

## 2023-10-09 NOTE — Progress Notes (Signed)
 Occupational Therapy Treatment Patient Details Name: Antonio Serrano MRN: [id number] DOB: 1946/06/15 Today's Date: 10/09/2023   History of present illness Patient is a 78 year old male who presented with mild crampy abdominal pain and more than 10 episodes of vomiting.  In the ER patient's CAT scan is concerning for small bowel obstruction, patient has had nasogastric tube placed. General surgery was consulted.  Due to lack of improvement, patient underwent diagnostic laparoscopy with lysis of adhesion, laparoscopic appendectomy by Dr. Sheldon on 12/29. PMH: TIA, HTN, THA, CKD III   OT comments  Patient was upset during session with hospital stay and had some unrealistic expectations voiced during session. MD made aware of patients request to be seen again today via secure chat per patients explicit request. Patient was able to transfer to [facility] with min A with RW with increased time which is an improvement from previous sessions. Patient will benefit from continued inpatient follow up therapy, <3 hours/day. Patient would continue to benefit from skilled OT services at this time while admitted and after d/c to address noted deficits in order to improve overall safety and independence in ADLs.        If plan is discharge home, recommend the following:  A lot of help with bathing/dressing/bathroom;Assistance with cooking/housework;Direct supervision/assist for medications management;Assist for transportation;Help with stairs or ramp for entrance;Direct supervision/assist for financial management   Equipment Recommendations  None recommended by OT       Precautions / Restrictions Precautions Precautions: Fall Precaution Comments: recent ABD surgery Restrictions Weight Bearing Restrictions Per Provider Order: No       Mobility Bed Mobility Overal bed mobility: Needs Assistance       Supine to sit: Min assist Sit to supine: Supervision   General bed mobility comments: with increased time and  A to scoot to EOB            Balance Overall balance assessment: Needs assistance Sitting-balance support: Feet supported, No upper extremity supported Sitting balance-Leahy Scale: Fair     Standing balance support: During functional activity, Bilateral upper extremity supported, Reliant on assistive device for balance Standing balance-Leahy Scale: Poor         ADL either performed or assessed with clinical judgement   ADL Overall ADL's : Needs assistance/impaired                         Toilet Transfer: Minimal assistance;+2 for physical assistance;+2 for safety/equipment;Stand-pivot;Rolling walker (2 wheels) Toilet Transfer Details (indicate cue type and reason): to [facility] and back Toileting- Clothing Manipulation and Hygiene: Sit to/from stand;Total assistance Toileting - Clothing Manipulation Details (indicate cue type and reason): cant take hands off walker needed for balance.       General ADL Comments: needed increased encouragement to engage in [facility] v.s. bed pan      Cognition Arousal: Alert Behavior During Therapy: WFL for tasks assessed/performed Overall Cognitive Status: Within Functional Limits for tasks assessed                                 General Comments: frustrated with all things related to hospital stay. wants a cheeseburger.                   Pertinent Vitals/ Pain       Pain Assessment Pain Assessment: Faces Faces Pain Scale: No hurt         Frequency  Min 1X/week        Progress Toward Goals  OT Goals(current goals can now be found in the care plan section)  Progress towards OT goals: Progressing toward goals     Plan         AM-PAC OT 6 Clicks Daily Activity     Outcome Measure   Help from another person eating meals?: A Little Help from another person taking care of personal grooming?: A Lot Help from another person toileting, which includes using toliet, bedpan, or urinal?: A Lot Help from  another person bathing (including washing, rinsing, drying)?: A Lot Help from another person to put on and taking off regular upper body clothing?: A Little Help from another person to put on and taking off regular lower body clothing?: A Lot 6 Click Score: 14    End of Session Equipment Utilized During Treatment: Gait belt;Rolling walker (2 wheels)  OT Visit Diagnosis: Unsteadiness on feet (R26.81);Other abnormalities of gait and mobility (R26.89)   Activity Tolerance Patient tolerated treatment well   Patient Left in bed;with call bell/phone within reach   Nurse Communication Mobility status        Time: 8594-8576 OT Time Calculation (min): 18 min  Charges: OT General Charges $OT Visit: 1 Visit OT Treatments $Self Care/Home Management : 23-37 mins  Antonio LEYLAND, MS Acute Rehabilitation Department Office# [phone]   Antonio Serrano Dance 10/09/2023, 4:23 PM

## 2023-10-09 NOTE — TOC Progression Note (Signed)
 Transition of Care [facility]) - Progression Note    Patient Details  Name: Antonio Serrano MRN: [id number] Date of Birth: 17-Nov-1945  Transition of Care [facility]) CM/SW Contact  Alfonse JONELLE Rex, RN Phone Number: 10/09/2023, 10:46 AM  Clinical Narrative:   Call to Lexine curlie Rake, confirmed SNF request is under medical review. TOC will continue to follow.     Expected Discharge Plan: Skilled Nursing Facility Barriers to Discharge: English As A Second Language Teacher, SNF Pending bed offer  Expected Discharge Plan and Services In-house Referral: Clinical Social Work   Post Acute Care Choice: Home Health Living arrangements for the past 2 months: Mobile Home                 DME Arranged: N/A DME Agency: NA       HH Arranged: PT, OT   Date HH Agency Contacted: 10/01/23 Time HH Agency Contacted: 1445 Representative spoke with at [facility] Agency: [facility]   Social Determinants of Health (SDOH) Interventions SDOH Screenings   Food Insecurity: No Food Insecurity (09/25/2023)  Housing: Low Risk  (09/25/2023)  Transportation Needs: No Transportation Needs (09/25/2023)  Utilities: Not At Risk (09/25/2023)  Alcohol  Screen: Low Risk  (11/16/2020)  Depression (PHQ2-9): Medium Risk (07/03/2023)  Financial Resource Strain: Low Risk  (11/28/2022)  Physical Activity: Inactive (08/28/2023)  Social Connections: Moderately Isolated (10/01/2023)  Stress: No Stress Concern Present (11/28/2022)  Tobacco Use: High Risk (09/25/2023)  Health Literacy: Adequate Health Literacy (08/28/2023)    Readmission Risk Interventions    10/01/2023    2:54 PM 06/19/2023   10:18 AM 06/18/2023   10:27 AM  Readmission Risk Prevention Plan  Transportation Screening Complete Complete Complete  PCP or Specialist Appt within 5-7 Days  Complete Complete  Home Care Screening  Complete Complete  Medication Review (RN CM)  Complete Complete  HRI or Home Care Consult Complete    Social Work Consult for Recovery Care  Planning/Counseling Complete    Palliative Care Screening Not Applicable    Medication Review Oceanographer) Complete

## 2023-10-09 NOTE — Progress Notes (Signed)
 Physical Therapy Treatment Patient Details Name: Antonio Serrano MRN: [id number] DOB: 07-22-1946 Today's Date: 10/09/2023   History of Present Illness Patient is a 78 year old male who presented with mild crampy abdominal pain and more than 10 episodes of vomiting.  In the ER patient's CAT scan is concerning for small bowel obstruction, patient has had nasogastric tube placed. General surgery was consulted.  Due to lack of improvement, patient underwent diagnostic laparoscopy with lysis of adhesion, laparoscopic appendectomy by Dr. Sheldon on 12/29. PMH: TIA, HTN, THA, CKD III    PT Comments  General Comments: AxO x 3 Pt expressing frustration about when he is being discharged.  RN explained a Academic Librarian.  Pt eager to get to Rehab stating I'm ready to get going so I can (eventually) get home. Assisted OOB to amb was limited.  General bed mobility comments: pt required assist with scooting to EOB needing increased time as well.  General transfer comment: Applied slippers. Used B platform EVA walker for increased support.   pt required x 3 attempts with MAX Asisst to rise from elevated bed due to posterior lean and overall weakness.  B feet skiing forward. General Gait Details: Used EVA walker for increased support.  Limited amb distance 12 feet with difficulty due to overall weakness and max c/o fatigue.  No energy.  Recliner following behind for safety.  Decreased self WBing thru R LE due to bad foot (hammer Toe).  I like this walker EVA walker, stated Pt.  Profound weakness. Prior pt lived home with Spouse amb with a walker on his own.  Pt will need ST Rehab at SNF to address mobility and functional decline prior to safely returning home.    If plan is discharge home, recommend the following: A lot of help with walking and/or transfers;A lot of help with bathing/dressing/bathroom;Help with stairs or ramp for entrance;Assist for transportation;Assistance with cooking/housework    Can travel by private vehicle     No  Equipment Recommendations  None recommended by PT    Recommendations for Other Services       Precautions / Restrictions Precautions Precautions: Fall Precaution Comments: recent ABD surgery Restrictions Weight Bearing Restrictions Per Provider Order: No     Mobility  Bed Mobility Overal bed mobility: Needs Assistance Bed Mobility: Supine to Sit     Supine to sit: Contact guard, Min assist     General bed mobility comments: pt required assist with scooting to EOB needing increased time as well.    Transfers Overall transfer level: Needs assistance Equipment used: None Transfers: Sit to/from Stand Sit to Stand: Max assist           General transfer comment: Applied slippers. Used B platform EVA walker for increased support.   pt required x 3 attempts with MAX Asisst to rise from elevated bed due to posterior lean and overall weakness.  B feet skiing forward.    Ambulation/Gait Ambulation/Gait assistance: Max assist Gait Distance (Feet): 12 Feet Assistive device: Elyn Finder   Gait velocity: decreased     General Gait Details: Used EVA walker for increased support.  Limited amb distance 12 feet with difficulty due to overall weakness and max c/o fatigue.  No energy.  Recliner following behind for safety.  Decreased self WBing thru R LE due to bad foot (hammer Toe).  I like this walker EVA walker, stated Pt.  Profound weakness.   Stairs  Wheelchair Mobility     Tilt Bed    Modified Rankin (Stroke Patients Only)       Balance                                            Cognition Arousal: Alert Behavior During Therapy: WFL for tasks assessed/performed                                   General Comments: AxO x 3 Pt expressing frustration about when he is being discharged.  RN explained a Academic Librarian.  Pt eager to get to Rehab stating I'm  ready to get going so I can (eventually) get home.        Exercises      General Comments        Pertinent Vitals/Pain Pain Assessment Pain Assessment: Faces Pain Location: ABD Pain Descriptors / Indicators: Dull Pain Intervention(s): Monitored during session    Home Living                          Prior Function            PT Goals (current goals can now be found in the care plan section) Progress towards PT goals: Progressing toward goals    Frequency    Min 1X/week      PT Plan      Co-evaluation              AM-PAC PT 6 Clicks Mobility   Outcome Measure  Help needed turning from your back to your side while in a flat bed without using bedrails?: A Lot Help needed moving from lying on your back to sitting on the side of a flat bed without using bedrails?: A Lot Help needed moving to and from a bed to a chair (including a wheelchair)?: A Lot Help needed standing up from a chair using your arms (e.g., wheelchair or bedside chair)?: A Lot Help needed to walk in hospital room?: A Lot Help needed climbing 3-5 steps with a railing? : Total 6 Click Score: 11    End of Session Equipment Utilized During Treatment: Gait belt Activity Tolerance: Patient limited by fatigue Patient left: in chair;with call bell/phone within reach Nurse Communication: Mobility status PT Visit Diagnosis: Other abnormalities of gait and mobility (R26.89);Muscle weakness (generalized) (M62.81);Difficulty in walking, not elsewhere classified (R26.2);Unsteadiness on feet (R26.81)     Time: 8879-8855 PT Time Calculation (min) (ACUTE ONLY): 24 min  Charges:    $Gait Training: 8-22 mins $Therapeutic Activity: 8-22 mins PT General Charges $$ ACUTE PT VISIT: 1 Visit                     {Desta Bujak  PTA Acute  Rehabilitation Services Office M-F          [phone]

## 2023-10-09 NOTE — Progress Notes (Signed)
 PROGRESS NOTE  Antonio Serrano FMW:[id number] DOB: 02/04/1946 DOA: 09/25/2023 PCP: Candise Aleene DEL, MD   LOS: 14 days   Brief narrative:  Antonio Serrano is a 78 y.o. male with medical history significant of no reported intra-abdominal surgery, presented hospital with crampy abdominal pain with vomiting for 2 days prior to presentation to the hospital.  In the ED, CT A/P was concerning for SBO. General surgery was consulted.  Due to lack of improvement, patient underwent diagnostic laparoscopy with lysis of adhesion, laparoscopic appendectomy by Dr. Sheldon on 12/29.  At this time patient has improved and is awaiting for skilled nursing facility placement.  Assessment/Plan: Principal Problem:   SBO (small bowel obstruction) (HCC) Active Problems:   Chronic renal insufficiency, stage III (moderate) (HCC)   Type 2 diabetes mellitus with stage 3a chronic kidney disease, without long-term current use of insulin  (HCC)   Constipation, chronic   Hyponatremia   Leukocytosis   AKI (acute kidney injury) (HCC)   Hypokalemia   MRSA (methicillin resistant Staphylococcus aureus) colonization   Need for emotional support   Goals of care, counseling/discussion   Small bowel obstruction [facility])   Palliative care encounter   Counseling and coordination of care   SBO Status post laparoscopic lysis of adhesion, laparoscopic appendectomy on 12/29.  Status post NG tube removal 12/31.  Has been advanced on oral diet and general surgery has signed off since 10/04/2023.   AKI Baseline creatinine 1.3, latest creatinine of 1.1.  Has resolved with IV fluids.   MRSA in urine Urine culture from 12/25 showed MRSA.  Did not have any urinary symptoms, fever, blood cultures are negative. No systemic signs of infection. Repeat UA on 12/27 was negative.  Previous provider had discussed with ID on call. In setting of no systemic signs and symptoms, this is likely a contaminant.    Diarrhea Likely secondary to recent  antibiotic usage with stool softeners laxative.  GI pathogen panel pending. As needed Imodium  once GI panel has resulted has completed antibiotic.  Denies any bowel movement since yesterday stool pathogen panel is still pending.   Hypokalemia Improved after replacement.  Latest potassium of 3.5.   Prior history of CVA Continue Plavix    Hypertension Norvasc  on hold.  Blood pressure seems to be stable.   Goals of care discussion Family wished for palliative care consultation.  Reported that patient mostly spends time in his recliner/bed, refusing to ambulate, go to appointments with poor oral intake.  Have been working with his PCP to establish care for patient in a nursing home as they are unable to continue to take care of him.  Family had multiple complaints including patient's inability to ambulate and a questionable diagnosis of Parkinson's disease of which patient had been referred to neurology outpatient, but refuses to go for any appointments. Patient is noted to have capacity to make informed decisions.  Deconditioning debility.  At this time plan is for skilled nursing facility placement.   DVT prophylaxis: heparin  injection 5,000 Units Start: 09/26/23 0600 SCDs Start: 09/25/23 2127   Disposition: Skilled nursing facility when bed found.  Medically stable for disposition.  Status is: Inpatient Remains inpatient appropriate because: Need for skilled nursing facility    Code Status:     Code Status: Full Code  Family Communication: None at bedside  Consultants: General surgery  Procedures: Diagnostic laparoscopy with lysis of adhesions and laparoscopic appendectomy by Dr. Sheldon on 09/29/2023.  Anti-infectives:  None  Anti-infectives (From admission, onward)    Start  Dose/Rate Route Frequency Ordered Stop   09/29/23 2200  cefoTEtan  (CEFOTAN ) 2 g in sodium chloride  0.9 % 100 mL IVPB        2 g 200 mL/hr over 30 Minutes Intravenous Every 12 hours 09/29/23 1313  09/30/23 2033   09/29/23 1215  cefoTEtan  (CEFOTAN ) 2 g in sodium chloride  0.9 % 100 mL IVPB        2 g 200 mL/hr over 30 Minutes Intravenous On call to O.R. 09/29/23 0759 09/30/23 2033   09/29/23 1200  vancomycin  (VANCOCIN ) IVPB 1000 mg/200 mL premix       Note to Pharmacy: Pharmacy may adjust dosing strength, interval, or rate of medication as needed for optimal therapy for the patient  Send with patient on call to the OR.  Anesthesia to complete antibiotic administration <75min prior to incision per [facility].   1,000 mg 200 mL/hr over 60 Minutes Intravenous On call to O.R. 09/29/23 0748 09/29/23 1055   09/29/23 0923  sodium chloride  0.9 % with cefoTEtan  (CEFOTAN ) ADS Med       Note to Pharmacy: [facility]: cabinet override      09/29/23 0923 09/29/23 1028   09/29/23 0923  vancomycin  (VANCOCIN ) 1-5 GM/200ML-% IVPB       Note to Pharmacy: [facility]: cabinet override      09/29/23 0923 09/29/23 1028   09/25/23 2200  metroNIDAZOLE  (FLAGYL ) IVPB 500 mg  Status:  Discontinued        500 mg 100 mL/hr over 60 Minutes Intravenous Every 12 hours 09/25/23 2059 09/26/23 1142   09/25/23 2115  cefTRIAXone  (ROCEPHIN ) 2 g in sodium chloride  0.9 % 100 mL IVPB  Status:  Discontinued        2 g 200 mL/hr over 30 Minutes Intravenous Every 24 hours 09/25/23 2059 09/26/23 1142        Subjective: Today, patient was seen and examined at bedside.  Patient feels frustrated that he is still here in the hospital.  Stated that he did not have any bowel movement since yesterday.  Inquiring about discharge.  Objective: Vitals:   10/08/23 2306 10/09/23 0616  BP: 113/70 113/63  Pulse: 80 72  Resp: 14 13  Temp: 97.7 F (36.5 C) 97.8 F (36.6 C)  SpO2: 100% 100%    Intake/Output Summary (Last 24 hours) at 10/09/2023 0939 Last data filed at 10/09/2023 0839 Gross per 24 hour  Intake 365 ml  Output 400 ml  Net -35 ml   Filed Weights   09/25/23 1516  Weight: 72.6 kg   Body mass index is 25.82  kg/m.   Physical Exam:  GENERAL: Patient is alert awake and oriented. Not in obvious distress.  Elderly male Communicative HENT: No scleral pallor or icterus. Pupils equally reactive to light. Oral mucosa is moist NECK: is supple, no gross swelling noted. CHEST: Clear to auscultation. No crackles or wheezes.  CVS: S1 and S2 heard, no murmur. Regular rate and rhythm.  ABDOMEN: Soft, non-tender, bowel sounds are present.  Healed laparoscopic scars. EXTREMITIES: No edema. CNS: Cranial nerves are intact. No focal motor deficits. SKIN: warm and dry without rashes.  Data Review: I have personally reviewed the following laboratory data and studies,  CBC: Recent Labs  Lab 10/06/23 1007 10/08/23 1053 10/09/23 0458  WBC 9.3 10.5 8.9  HGB 12.9* 13.4 12.3*  HCT 37.2* 38.5* 34.9*  MCV 86.7 86.9 85.7  PLT 327 389 359   Basic Metabolic Panel: Recent Labs  Lab 10/06/23 1007 10/07/23  9567 10/08/23 1053 10/09/23 0458  NA 132* 132* 130* 132*  K 3.1* 4.1 3.6 3.5  CL 101 107 103 105  CO2 21* 19* 20* 18*  GLUCOSE 82 80 138* 90  BUN 14 14 16 16   CREATININE 0.95 1.04 1.08 1.17  CALCIUM  8.2* 8.2* 8.3* 8.0*  MG 1.7  --   --   --    Liver Function Tests: No results for input(s): AST, ALT, ALKPHOS, BILITOT, PROT, ALBUMIN in the last 168 hours. No results for input(s): LIPASE, AMYLASE in the last 168 hours. No results for input(s): AMMONIA in the last 168 hours. Cardiac Enzymes: No results for input(s): CKTOTAL, CKMB, CKMBINDEX, TROPONINI in the last 168 hours. BNP (last 3 results) No results for input(s): BNP in the last 8760 hours.  ProBNP (last 3 results) No results for input(s): PROBNP in the last 8760 hours.  CBG: No results for input(s): GLUCAP in the last 168 hours. No results found for this or any previous visit (from the past 240 hours).   Studies: No results found.    Jinnie Onley, MD  [facility] 10/09/2023  If 7PM-7AM, please  contact night-coverage

## 2023-10-09 NOTE — TOC Progression Note (Signed)
 Transition of Care [facility]) - Progression Note    Patient Details  Name: Antonio Serrano MRN: [id number] Date of Birth: 04/18/1946  Transition of Care [facility]) CM/SW Contact  Alfonse JONELLE Rex, RN Phone Number: 10/09/2023, 3:19 PM  Clinical Narrative:   Call to pt's  dtr in law, Angie, per her request for update on status of SNF auth, informed auth still pending, case has been sent for medical review, NCM will update Angie with determination from insurance. TOC will continue to follow.     Expected Discharge Plan: Skilled Nursing Facility Barriers to Discharge: English As A Second Language Teacher, SNF Pending bed offer  Expected Discharge Plan and Services In-house Referral: Clinical Social Work   Post Acute Care Choice: Home Health Living arrangements for the past 2 months: Mobile Home                 DME Arranged: N/A DME Agency: NA       HH Arranged: PT, OT   Date HH Agency Contacted: 10/01/23 Time HH Agency Contacted: 1445 Representative spoke with at [facility] Agency: [facility]   Social Determinants of Health (SDOH) Interventions SDOH Screenings   Food Insecurity: No Food Insecurity (09/25/2023)  Housing: Low Risk  (09/25/2023)  Transportation Needs: No Transportation Needs (09/25/2023)  Utilities: Not At Risk (09/25/2023)  Alcohol  Screen: Low Risk  (11/16/2020)  Depression (PHQ2-9): Medium Risk (07/03/2023)  Financial Resource Strain: Low Risk  (11/28/2022)  Physical Activity: Inactive (08/28/2023)  Social Connections: Moderately Isolated (10/01/2023)  Stress: No Stress Concern Present (11/28/2022)  Tobacco Use: High Risk (09/25/2023)  Health Literacy: Adequate Health Literacy (08/28/2023)    Readmission Risk Interventions    10/01/2023    2:54 PM 06/19/2023   10:18 AM 06/18/2023   10:27 AM  Readmission Risk Prevention Plan  Transportation Screening Complete Complete Complete  PCP or Specialist Appt within 5-7 Days  Complete Complete  Home Care Screening  Complete Complete  Medication  Review (RN CM)  Complete Complete  HRI or Home Care Consult Complete    Social Work Consult for Recovery Care Planning/Counseling Complete    Palliative Care Screening Not Applicable    Medication Review Oceanographer) Complete

## 2023-10-10 DIAGNOSIS — K56609 Unspecified intestinal obstruction, unspecified as to partial versus complete obstruction: Secondary | ICD-10-CM | POA: Diagnosis not present

## 2023-10-10 DIAGNOSIS — R262 Difficulty in walking, not elsewhere classified: Principal | ICD-10-CM | POA: Insufficient documentation

## 2023-10-10 MED ORDER — LOPERAMIDE HCL 2 MG PO CAPS
2.0000 mg | ORAL_CAPSULE | Freq: Once | ORAL | Status: AC
  Administered 2023-10-10: 2 mg via ORAL
  Filled 2023-10-10: qty 1

## 2023-10-10 MED ORDER — POTASSIUM CHLORIDE CRYS ER 20 MEQ PO TBCR
40.0000 meq | EXTENDED_RELEASE_TABLET | Freq: Once | ORAL | Status: AC
  Administered 2023-10-10: 40 meq via ORAL
  Filled 2023-10-10: qty 2

## 2023-10-10 NOTE — Progress Notes (Addendum)
 PROGRESS NOTE  Antonio Serrano FMW:[id number] DOB: Jul 02, 1946 DOA: 09/25/2023 PCP: Candise Aleene DEL, MD   LOS: 15 days   Brief narrative:  Antonio Serrano is a 78 y.o. male with medical history significant of no reported intra-abdominal surgery, presented hospital with crampy abdominal pain with vomiting for 2 days prior to presentation to the hospital.  In the ED, CT A/P was concerning for SBO. General surgery was consulted.  Due to lack of improvement, patient underwent diagnostic laparoscopy with lysis of adhesion, laparoscopic appendectomy by Dr. Sheldon on 12/29.  At this time, patient has improved and is awaiting for skilled nursing facility placement.  Assessment/Plan: Principal Problem:   SBO (small bowel obstruction) (HCC) Active Problems:   Chronic renal insufficiency, stage III (moderate) (HCC)   Type 2 diabetes mellitus with stage 3a chronic kidney disease, without long-term current use of insulin  (HCC)   Constipation, chronic   Hyponatremia   Leukocytosis   AKI (acute kidney injury) (HCC)   Hypokalemia   MRSA (methicillin resistant Staphylococcus aureus) colonization   Need for emotional support   Goals of care, counseling/discussion   Small bowel obstruction [facility])   Palliative care encounter   Counseling and coordination of care   Small bowel obstruction Status post laparoscopic lysis of adhesion, laparoscopic appendectomy on 12/29.  Status post NG tube removal 12/31.  Has been advanced on oral diet and general surgery has signed off since 10/04/2023.   AKI Baseline creatinine 1.3, latest creatinine of 1.1.  Has resolved with IV fluids.   MRSA in urine Urine culture from 12/25 showed MRSA.  Did not have any urinary symptoms, fever, blood cultures are negative. No systemic signs of infection. Repeat UA on 12/27 was negative.  Previous provider had discussed with ID on call. In setting of no systemic signs and symptoms, this is likely a contaminant.    Diarrhea secondary  to norovirus/adenovirus. Had slowed down yesterday but had 1 more episode this morning.  Patient wishes to take Imodium .  Spoke about natural course and need for hydration.  Discontinue stool softener.  Give 1 dose of Imodium  2 mg today.   Hypokalemia Improved after replacement.  Latest potassium of 3.5.  Will give 1 more 40 mEq today.   Prior history of CVA Continue Plavix    Hypertension Norvasc  on hold.  Blood pressure seems to be stable.   Goals of care discussion As per the previous provider, family had wished for palliative care consultation.  Reported that patient mostly spends time in his recliner/bed, refusing to ambulate, go to appointments with poor oral intake.  Have been working with his PCP to establish care for patient in a nursing home as they are unable to continue to take care of him.  Family had multiple complaints including patient's inability to ambulate and a questionable diagnosis of Parkinson's disease of which patient had been referred to neurology outpatient, but refuses to go for any appointments. Patient is noted to have capacity to make informed decisions.  Deconditioning debility.  At this time, plan is for skilled nursing facility placement.  Spoke with the patient's daughter-in-law who indicated that she wanted to have outpatient [place] neurology referral for possible parkinsonism and gait issues.  I have made an ambulatory referral at this time.  DVT prophylaxis: heparin  injection 5,000 Units Start: 09/26/23 0600 SCDs Start: 09/25/23 2127   Disposition: Skilled nursing facility when bed found.  Medically stable for disposition.  Insurance authorization pending.  Status is: Inpatient  Remains inpatient appropriate because: Need  for skilled nursing facility   Code Status:     Code Status: Full Code  Family Communication: Spoke with the patient's daughter-in-law on the phone on 10/10/2023.  Consultants: General surgery  Procedures: Diagnostic  laparoscopy with lysis of adhesions and laparoscopic appendectomy by Dr. Sheldon on 09/29/2023.  Anti-infectives:  None  Anti-infectives (From admission, onward)    Start     Dose/Rate Route Frequency Ordered Stop   09/29/23 2200  cefoTEtan  (CEFOTAN ) 2 g in sodium chloride  0.9 % 100 mL IVPB        2 g 200 mL/hr over 30 Minutes Intravenous Every 12 hours 09/29/23 1313 09/30/23 2033   09/29/23 1215  cefoTEtan  (CEFOTAN ) 2 g in sodium chloride  0.9 % 100 mL IVPB        2 g 200 mL/hr over 30 Minutes Intravenous On call to O.R. 09/29/23 0759 09/30/23 2033   09/29/23 1200  vancomycin  (VANCOCIN ) IVPB 1000 mg/200 mL premix       Note to Pharmacy: Pharmacy may adjust dosing strength, interval, or rate of medication as needed for optimal therapy for the patient  Send with patient on call to the OR.  Anesthesia to complete antibiotic administration <82min prior to incision per [facility].   1,000 mg 200 mL/hr over 60 Minutes Intravenous On call to O.R. 09/29/23 0748 09/29/23 1055   09/29/23 0923  sodium chloride  0.9 % with cefoTEtan  (CEFOTAN ) ADS Med       Note to Pharmacy: [facility]: cabinet override      09/29/23 0923 09/29/23 1028   09/29/23 0923  vancomycin  (VANCOCIN ) 1-5 GM/200ML-% IVPB       Note to Pharmacy: [facility]: cabinet override      09/29/23 0923 09/29/23 1028   09/25/23 2200  metroNIDAZOLE  (FLAGYL ) IVPB 500 mg  Status:  Discontinued        500 mg 100 mL/hr over 60 Minutes Intravenous Every 12 hours 09/25/23 2059 09/26/23 1142   09/25/23 2115  cefTRIAXone  (ROCEPHIN ) 2 g in sodium chloride  0.9 % 100 mL IVPB  Status:  Discontinued        2 g 200 mL/hr over 30 Minutes Intravenous Every 24 hours 09/25/23 2059 09/26/23 1142      Subjective: Today, patient was seen and examined at bedside.  Did have 1 episode of diarrhea this morning and wishes to have something for it.  Denies any abdominal pain, nausea, vomiting, fever, chills or rigor.  Has been able to eat food  okay.  Objective: Vitals:   10/09/23 2254 10/10/23 0630  BP: (!) 113/55 115/66  Pulse: 78 72  Resp: 20 20  Temp: 98.5 F (36.9 C) 98.4 F (36.9 C)  SpO2: 99% 99%    Intake/Output Summary (Last 24 hours) at 10/10/2023 0949 Last data filed at 10/10/2023 0935 Gross per 24 hour  Intake 845 ml  Output 300 ml  Net 545 ml   Filed Weights   09/25/23 1516  Weight: 72.6 kg   Body mass index is 25.82 kg/m.   Physical Exam:  GENERAL: Patient is alert awake and oriented. Not in obvious distress.  Elderly male, Communicative HENT: No scleral pallor or icterus. Pupils equally reactive to light. Oral mucosa is moist NECK: is supple, no gross swelling noted. CHEST: Clear to auscultation. No crackles or wheezes.  CVS: S1 and S2 heard, no murmur. Regular rate and rhythm.  ABDOMEN: Soft, non-tender, bowel sounds are present.  Mild distention.  Healed laparoscopic scars. EXTREMITIES: No edema. CNS: Cranial  nerves are intact. No focal motor deficits. SKIN: warm and dry without rashes.  Data Review: I have personally reviewed the following laboratory data and studies,  CBC: Recent Labs  Lab 10/06/23 1007 10/08/23 1053 10/09/23 0458  WBC 9.3 10.5 8.9  HGB 12.9* 13.4 12.3*  HCT 37.2* 38.5* 34.9*  MCV 86.7 86.9 85.7  PLT 327 389 359   Basic Metabolic Panel: Recent Labs  Lab 10/06/23 1007 10/07/23 0432 10/08/23 1053 10/09/23 0458  NA 132* 132* 130* 132*  K 3.1* 4.1 3.6 3.5  CL 101 107 103 105  CO2 21* 19* 20* 18*  GLUCOSE 82 80 138* 90  BUN 14 14 16 16   CREATININE 0.95 1.04 1.08 1.17  CALCIUM  8.2* 8.2* 8.3* 8.0*  MG 1.7  --   --   --    Liver Function Tests: No results for input(s): AST, ALT, ALKPHOS, BILITOT, PROT, ALBUMIN in the last 168 hours. No results for input(s): LIPASE, AMYLASE in the last 168 hours. No results for input(s): AMMONIA in the last 168 hours. Cardiac Enzymes: No results for input(s): CKTOTAL, CKMB, CKMBINDEX, TROPONINI in  the last 168 hours. BNP (last 3 results) No results for input(s): BNP in the last 8760 hours.  ProBNP (last 3 results) No results for input(s): PROBNP in the last 8760 hours.  CBG: No results for input(s): GLUCAP in the last 168 hours. Recent Results (from the past 240 hours)  Gastrointestinal Panel by PCR , Stool     Status: Abnormal   Collection Time: 10/08/23 10:41 AM   Specimen: Stool  Result Value Ref Range Status   Campylobacter species NOT DETECTED NOT DETECTED Final   Plesimonas shigelloides NOT DETECTED NOT DETECTED Final   Salmonella species NOT DETECTED NOT DETECTED Final   Yersinia enterocolitica NOT DETECTED NOT DETECTED Final   Vibrio species NOT DETECTED NOT DETECTED Final   Vibrio cholerae NOT DETECTED NOT DETECTED Final   Enteroaggregative E coli (EAEC) NOT DETECTED NOT DETECTED Final   Enteropathogenic E coli (EPEC) NOT DETECTED NOT DETECTED Final   Enterotoxigenic E coli (ETEC) NOT DETECTED NOT DETECTED Final   Shiga like toxin producing E coli (STEC) NOT DETECTED NOT DETECTED Final   Shigella/Enteroinvasive E coli (EIEC) NOT DETECTED NOT DETECTED Final   Cryptosporidium NOT DETECTED NOT DETECTED Final   Cyclospora cayetanensis NOT DETECTED NOT DETECTED Final   Entamoeba histolytica NOT DETECTED NOT DETECTED Final   Giardia lamblia NOT DETECTED NOT DETECTED Final   Adenovirus F40/41 DETECTED (A) NOT DETECTED Final    Comment: RESULT CALLED TO, READ BACK BY AND VERIFIED WITH: DEVERE FERRARIS, RN 1142 10/09/23 GM    Astrovirus NOT DETECTED NOT DETECTED Final   Norovirus GI/GII DETECTED (A) NOT DETECTED Final    Comment: RESULT CALLED TO, READ BACK BY AND VERIFIED WITH: DEVERE FERRARIS, RN [phone] 10/09/23 GM    Rotavirus A NOT DETECTED NOT DETECTED Final   Sapovirus (I, II, IV, and V) NOT DETECTED NOT DETECTED Final    Comment: Performed at [facility], [address] [place], [place] [address]     Studies: No results found.    Xochilt Conant,  MD  [facility] 10/10/2023  If 7PM-7AM, please contact night-coverage

## 2023-10-10 NOTE — TOC Progression Note (Signed)
 Transition of Care [facility]) - Progression Note    Patient Details  Name: Antonio Serrano MRN: [id number] Date of Birth: 10/13/1945  Transition of Care [facility]) CM/SW Contact  Alfonse JONELLE Rex, RN Phone Number: 10/10/2023, 10:16 AM  Clinical Narrative:   Per Lexine barrows still pending, in med review.     Expected Discharge Plan: Skilled Nursing Facility Barriers to Discharge: English As A Second Language Teacher, SNF Pending bed offer  Expected Discharge Plan and Services In-house Referral: Clinical Social Work   Post Acute Care Choice: Home Health Living arrangements for the past 2 months: Mobile Home                 DME Arranged: N/A DME Agency: NA       HH Arranged: PT, OT   Date HH Agency Contacted: 10/01/23 Time HH Agency Contacted: 1445 Representative spoke with at [facility] Agency: [facility]   Social Determinants of Health (SDOH) Interventions SDOH Screenings   Food Insecurity: No Food Insecurity (09/25/2023)  Housing: Low Risk  (09/25/2023)  Transportation Needs: No Transportation Needs (09/25/2023)  Utilities: Not At Risk (09/25/2023)  Alcohol  Screen: Low Risk  (11/16/2020)  Depression (PHQ2-9): Medium Risk (07/03/2023)  Financial Resource Strain: Low Risk  (11/28/2022)  Physical Activity: Inactive (08/28/2023)  Social Connections: Moderately Isolated (10/01/2023)  Stress: No Stress Concern Present (11/28/2022)  Tobacco Use: High Risk (09/25/2023)  Health Literacy: Adequate Health Literacy (08/28/2023)    Readmission Risk Interventions    10/01/2023    2:54 PM 06/19/2023   10:18 AM 06/18/2023   10:27 AM  Readmission Risk Prevention Plan  Transportation Screening Complete Complete Complete  PCP or Specialist Appt within 5-7 Days  Complete Complete  Home Care Screening  Complete Complete  Medication Review (RN CM)  Complete Complete  HRI or Home Care Consult Complete    Social Work Consult for Recovery Care Planning/Counseling Complete    Palliative Care Screening Not Applicable     Medication Review Oceanographer) Complete

## 2023-10-11 DIAGNOSIS — N1831 Chronic kidney disease, stage 3a: Secondary | ICD-10-CM

## 2023-10-11 DIAGNOSIS — N179 Acute kidney failure, unspecified: Secondary | ICD-10-CM

## 2023-10-11 DIAGNOSIS — R262 Difficulty in walking, not elsewhere classified: Secondary | ICD-10-CM | POA: Diagnosis not present

## 2023-10-11 DIAGNOSIS — K56609 Unspecified intestinal obstruction, unspecified as to partial versus complete obstruction: Secondary | ICD-10-CM | POA: Diagnosis not present

## 2023-10-11 LAB — BASIC METABOLIC PANEL
Anion gap: 8 (ref 5–15)
BUN: 16 mg/dL (ref 8–23)
CO2: 18 mmol/L — ABNORMAL LOW (ref 22–32)
Calcium: 8.1 mg/dL — ABNORMAL LOW (ref 8.9–10.3)
Chloride: 107 mmol/L (ref 98–111)
Creatinine, Ser: 1.17 mg/dL (ref 0.61–1.24)
GFR, Estimated: >60 mL/min (ref >60)
Glucose, Bld: 77 mg/dL (ref 70–99)
Potassium: 3.6 mmol/L (ref 3.5–5.1)
Sodium: 133 mmol/L — ABNORMAL LOW (ref 135–145)

## 2023-10-11 LAB — CBC
HCT: 33.4 % — ABNORMAL LOW (ref 39.0–52.0)
Hemoglobin: 11.7 g/dL — ABNORMAL LOW (ref 13.0–17.0)
MCH: 30.2 pg (ref 26.0–34.0)
MCHC: 35 g/dL (ref 30.0–36.0)
MCV: 86.1 fL (ref 80.0–100.0)
Platelets: 373 10*3/uL (ref 150–400)
RBC: 3.88 MIL/uL — ABNORMAL LOW (ref 4.22–5.81)
RDW: 15.9 % — ABNORMAL HIGH (ref 11.5–15.5)
WBC: 7.9 10*3/uL (ref 4.0–10.5)
nRBC: 0 % (ref 0.0–0.2)

## 2023-10-11 MED ORDER — POTASSIUM CHLORIDE CRYS ER 20 MEQ PO TBCR
40.0000 meq | EXTENDED_RELEASE_TABLET | Freq: Once | ORAL | Status: AC
Start: 2023-10-11 — End: 2023-10-11
  Administered 2023-10-11: 40 meq via ORAL
  Filled 2023-10-11: qty 2

## 2023-10-11 NOTE — Progress Notes (Signed)
 [facility] called to check ETA of arrival. No definite answer given just that he is still on the list.

## 2023-10-11 NOTE — Progress Notes (Signed)
 Patient picked up for transport by [facility]. Belongings taken with patient. Patient family called to make aware that patient had left the hospital.

## 2023-10-11 NOTE — Plan of Care (Signed)

## 2023-10-11 NOTE — TOC Transition Note (Signed)
 Transition of Care [facility]) - Discharge Note   Patient Details  Name: Antonio Serrano MRN: [id number] Date of Birth: [birth year]  Transition of Care [facility]) CM/SW Contact:  Alfonse JONELLE Rex, RN Phone Number: 10/11/2023, 3:28 PM   Clinical Narrative:   DC order to SFN, Allean, admissions coordinator, at [facility], confirmed bed available today for transfer, RM 34, Call Report [phone]. Pt/family notified of transfer. [facility] called for transport. No further TOC needs.     Final next level of care: Skilled Nursing Facility Barriers to Discharge: Barriers Resolved   Patient Goals and CMS Choice Patient states their goals for this hospitalization and ongoing recovery are:: return home CMS Medicare.gov Compare Post Acute Care list provided to:: Patient Represenative (must comment) (angie,daughterinlaw  [phone] (Mobile)) Choice offered to / list presented to : Adult Children      Discharge Placement              Patient [facility] bed at: [facility] Patient to be transferred to facility by: [facility] Name of family member notified: angie,daughterinlaw  [phone] [facility]) Patient and family notified of of transfer: 10/11/23  Discharge Plan and Services Additional resources added to the After Visit Summary for   In-house Referral: Clinical Social Work   Post Acute Care Choice: Home Health          DME Arranged: N/A DME Agency: NA       HH Arranged: PT, OT   Date HH Agency Contacted: 10/01/23 Time HH Agency Contacted: 1445 Representative spoke with at [facility] Agency: [facility]  Social Drivers of Health (SDOH) Interventions SDOH Screenings   Food Insecurity: No Food Insecurity (09/25/2023)  Housing: Low Risk  (09/25/2023)  Transportation Needs: No Transportation Needs (09/25/2023)  Utilities: Not At Risk (09/25/2023)  Alcohol  Screen: Low Risk  (11/16/2020)  Depression (PHQ2-9): Medium Risk (07/03/2023)  Financial Resource Strain: Low Risk  (11/28/2022)  Physical Activity:  Inactive (08/28/2023)  Social Connections: Moderately Isolated (10/01/2023)  Stress: No Stress Concern Present (11/28/2022)  Tobacco Use: High Risk (09/25/2023)  Health Literacy: Adequate Health Literacy (08/28/2023)     Readmission Risk Interventions    10/01/2023    2:54 PM 06/19/2023   10:18 AM 06/18/2023   10:27 AM  Readmission Risk Prevention Plan  Transportation Screening Complete Complete Complete  PCP or Specialist Appt within 5-7 Days  Complete Complete  Home Care Screening  Complete Complete  Medication Review (RN CM)  Complete Complete  HRI or Home Care Consult Complete    Social Work Consult for Recovery Care Planning/Counseling Complete    Palliative Care Screening Not Applicable    Medication Review Oceanographer) Complete

## 2023-10-11 NOTE — TOC Progression Note (Addendum)
 Transition of Care [facility]) - Progression Note    Patient Details  Name: Antonio Serrano MRN: [id number] Date of Birth: 01-13-1946  Transition of Care [facility]) CM/SW Contact  Alfonse JONELLE Rex, RN Phone Number: 10/11/2023, 10:44 AM  Clinical Narrative:   Call to Lexine curlie Gee, confirmed short term rehab/SNF request is still in medical review, team, pt/family updated. TOC will continue to follow.   -12:02pm Met with pt at bedside, explained SNF auth request was was initiated with his health insurance, request is currently under medical review with is insurance, we are waiting determination, voiced understanding. TOC will continue to follow and update patient regularly.  -2:21pm Per Availity, short term rehab/SNF shara approved, Auth ID [id number], days approved, 1/7 to 10/11/2023, team notified.   -2:24pm Text to [facility], admissions coordinator at [facility], states bed available but needs dc summary and dc order by 3pm today, attending notified.     Expected Discharge Plan: Skilled Nursing Facility Barriers to Discharge: English As A Second Language Teacher, SNF Pending bed offer  Expected Discharge Plan and Services In-house Referral: Clinical Social Work   Post Acute Care Choice: Home Health Living arrangements for the past 2 months: Mobile Home                 DME Arranged: N/A DME Agency: NA       HH Arranged: PT, OT   Date HH Agency Contacted: 10/01/23 Time HH Agency Contacted: 1445 Representative spoke with at [facility] Agency: [facility]   Social Determinants of Health (SDOH) Interventions SDOH Screenings   Food Insecurity: No Food Insecurity (09/25/2023)  Housing: Low Risk  (09/25/2023)  Transportation Needs: No Transportation Needs (09/25/2023)  Utilities: Not At Risk (09/25/2023)  Alcohol  Screen: Low Risk  (11/16/2020)  Depression (PHQ2-9): Medium Risk (07/03/2023)  Financial Resource Strain: Low Risk  (11/28/2022)  Physical Activity: Inactive (08/28/2023)  Social Connections:  Moderately Isolated (10/01/2023)  Stress: No Stress Concern Present (11/28/2022)  Tobacco Use: High Risk (09/25/2023)  Health Literacy: Adequate Health Literacy (08/28/2023)    Readmission Risk Interventions    10/01/2023    2:54 PM 06/19/2023   10:18 AM 06/18/2023   10:27 AM  Readmission Risk Prevention Plan  Transportation Screening Complete Complete Complete  PCP or Specialist Appt within 5-7 Days  Complete Complete  Home Care Screening  Complete Complete  Medication Review (RN CM)  Complete Complete  HRI or Home Care Consult Complete    Social Work Consult for Recovery Care Planning/Counseling Complete    Palliative Care Screening Not Applicable    Medication Review Oceanographer) Complete

## 2023-10-11 NOTE — Progress Notes (Signed)
 PROGRESS NOTE  Antonio Serrano FMW:[id number] DOB: Apr 04, 1946 DOA: 09/25/2023 PCP: Candise Aleene DEL, MD   LOS: 16 days   Brief narrative:  Antonio Serrano is a 78 y.o. male with medical history significant of no reported intra-abdominal surgery, presented hospital with crampy abdominal pain with vomiting for 2 days prior to presentation to the hospital.  In the ED, CT A/P was concerning for SBO. General surgery was consulted.  Due to lack of improvement, patient underwent diagnostic laparoscopy with lysis of adhesion, laparoscopic appendectomy by Dr. Sheldon on 12/29.  At this time, patient has improved and is awaiting for skilled nursing facility placement.  Assessment/Plan: Principal Problem:   SBO (small bowel obstruction) (HCC) Active Problems:   Chronic renal insufficiency, stage III (moderate) (HCC)   Type 2 diabetes mellitus with stage 3a chronic kidney disease, without long-term current use of insulin  (HCC)   Constipation, chronic   Hyponatremia   Leukocytosis   AKI (acute kidney injury) (HCC)   Hypokalemia   MRSA (methicillin resistant Staphylococcus aureus) colonization   Need for emotional support   Goals of care, counseling/discussion   Small bowel obstruction [facility])   Palliative care encounter   Counseling and coordination of care   Ambulatory dysfunction   Small bowel obstruction Status post laparoscopic lysis of adhesion, laparoscopic appendectomy on 12/29.  Status post NG tube removal 12/31.  Has been advanced on oral diet and general surgery has signed off since 10/04/2023.   AKI Baseline creatinine 1.3, latest creatinine of 1.1.  Has resolved with IV fluids.   MRSA in urine Urine culture from 12/25 showed MRSA.  Did not have any urinary symptoms, fever, blood cultures are negative. No systemic signs of infection. Repeat UA on 12/27 was negative.  Previous provider had discussed with ID on call. In setting of no systemic signs and symptoms, this is likely a contaminant.     Diarrhea secondary to norovirus/adenovirus. Improved at this time.  Received 1 dose of Imodium  yesterday.  Feels better.   Hypokalemia Improved after replacement.  Latest potassium of 3.6.  Will give 1 more dose of potassium 40 mEq today.   Prior history of CVA Continue Plavix    Hypertension Norvasc  on hold.  Blood pressure seems to be stable.   Goals of care discussion As per the previous provider, family had wished for palliative care consultation.  Reported that patient mostly spends time in his recliner/bed, refusing to ambulate, go to appointments with poor oral intake.  Have been working with his PCP to establish care for patient in a nursing home as they are unable to continue to take care of him.  Family had multiple complaints including patient's inability to ambulate and a questionable diagnosis of Parkinson's disease of which patient had been referred to neurology outpatient, but refuses to go for any appointments. Patient is noted to have capacity to make informed decisions.  Deconditioning debility.  At this time, plan is for skilled nursing facility placement.  Spoke with the patient's daughter-in-law who indicated that she wanted to have outpatient [place] neurology referral for possible parkinsonism and gait issues.  I have made an ambulatory referral   DVT prophylaxis: heparin  injection 5,000 Units Start: 09/26/23 0600 SCDs Start: 09/25/23 2127   Disposition: Skilled nursing facility when bed found.  Medically stable for disposition.  Insurance authorization pending.  Status is: Inpatient  Remains inpatient appropriate because: Need for skilled nursing facility   Code Status:     Code Status: Full Code  Family Communication: Spoke  with the patient's daughter-in-law on the phone on 10/10/2023.  Consultants: General surgery  Procedures: Diagnostic laparoscopy with lysis of adhesions and laparoscopic appendectomy by Dr. Sheldon on  09/29/2023.  Anti-infectives:  None  Anti-infectives (From admission, onward)    Start     Dose/Rate Route Frequency Ordered Stop   09/29/23 2200  cefoTEtan  (CEFOTAN ) 2 g in sodium chloride  0.9 % 100 mL IVPB        2 g 200 mL/hr over 30 Minutes Intravenous Every 12 hours 09/29/23 1313 09/30/23 2033   09/29/23 1215  cefoTEtan  (CEFOTAN ) 2 g in sodium chloride  0.9 % 100 mL IVPB        2 g 200 mL/hr over 30 Minutes Intravenous On call to O.R. 09/29/23 0759 09/30/23 2033   09/29/23 1200  vancomycin  (VANCOCIN ) IVPB 1000 mg/200 mL premix       Note to Pharmacy: Pharmacy may adjust dosing strength, interval, or rate of medication as needed for optimal therapy for the patient  Send with patient on call to the OR.  Anesthesia to complete antibiotic administration <11min prior to incision per [facility].   1,000 mg 200 mL/hr over 60 Minutes Intravenous On call to O.R. 09/29/23 0748 09/29/23 1055   09/29/23 0923  sodium chloride  0.9 % with cefoTEtan  (CEFOTAN ) ADS Med       Note to Pharmacy: [facility]: cabinet override      09/29/23 0923 09/29/23 1028   09/29/23 0923  vancomycin  (VANCOCIN ) 1-5 GM/200ML-% IVPB       Note to Pharmacy: [facility]: cabinet override      09/29/23 0923 09/29/23 1028   09/25/23 2200  metroNIDAZOLE  (FLAGYL ) IVPB 500 mg  Status:  Discontinued        500 mg 100 mL/hr over 60 Minutes Intravenous Every 12 hours 09/25/23 2059 09/26/23 1142   09/25/23 2115  cefTRIAXone  (ROCEPHIN ) 2 g in sodium chloride  0.9 % 100 mL IVPB  Status:  Discontinued        2 g 200 mL/hr over 30 Minutes Intravenous Every 24 hours 09/25/23 2059 09/26/23 1142      Subjective: Today, patient was seen and examined at bedside.  States that he feels much better.  Diarrhea has improved.  Has been eating good.  No shortness of breath fever chills or rigor.    Objective: Vitals:   10/10/23 2147 10/11/23 0612  BP: 117/62 127/65  Pulse: (!) 54 64  Resp: 18 18  Temp: 97.6 F (36.4 C) (!)  97.5 F (36.4 C)  SpO2: 100% 100%    Intake/Output Summary (Last 24 hours) at 10/11/2023 1326 Last data filed at 10/11/2023 0602 Gross per 24 hour  Intake 480 ml  Output 800 ml  Net -320 ml   Filed Weights   09/25/23 1516  Weight: 72.6 kg   Body mass index is 25.82 kg/m.   Physical Exam:  GENERAL: Patient is alert awake and oriented. Not in obvious distress.  Elderly male, Communicative HENT: No scleral pallor or icterus. Pupils equally reactive to light. Oral mucosa is moist NECK: is supple, no gross swelling noted. CHEST: Clear to auscultation. No crackles or wheezes.  CVS: S1 and S2 heard, no murmur. Regular rate and rhythm.  ABDOMEN: Soft, non-tender, bowel sounds are present.  Mild distention.  Healed laparoscopic scars. EXTREMITIES: No edema. CNS: Cranial nerves are intact. No focal motor deficits. SKIN: warm and dry without rashes.  Data Review: I have personally reviewed the following laboratory data and studies,  CBC: Recent Labs  Lab 10/06/23 1007 10/08/23 1053 10/09/23 0458 10/11/23 0459  WBC 9.3 10.5 8.9 7.9  HGB 12.9* 13.4 12.3* 11.7*  HCT 37.2* 38.5* 34.9* 33.4*  MCV 86.7 86.9 85.7 86.1  PLT 327 389 359 373   Basic Metabolic Panel: Recent Labs  Lab 10/06/23 1007 10/07/23 0432 10/08/23 1053 10/09/23 0458 10/11/23 0459  NA 132* 132* 130* 132* 133*  K 3.1* 4.1 3.6 3.5 3.6  CL 101 107 103 105 107  CO2 21* 19* 20* 18* 18*  GLUCOSE 82 80 138* 90 77  BUN 14 14 16 16 16   CREATININE 0.95 1.04 1.08 1.17 1.17  CALCIUM  8.2* 8.2* 8.3* 8.0* 8.1*  MG 1.7  --   --   --   --    Liver Function Tests: No results for input(s): AST, ALT, ALKPHOS, BILITOT, PROT, ALBUMIN in the last 168 hours. No results for input(s): LIPASE, AMYLASE in the last 168 hours. No results for input(s): AMMONIA in the last 168 hours. Cardiac Enzymes: No results for input(s): CKTOTAL, CKMB, CKMBINDEX, TROPONINI in the last 168 hours. BNP (last 3  results) No results for input(s): BNP in the last 8760 hours.  ProBNP (last 3 results) No results for input(s): PROBNP in the last 8760 hours.  CBG: No results for input(s): GLUCAP in the last 168 hours. Recent Results (from the past 240 hours)  Gastrointestinal Panel by PCR , Stool     Status: Abnormal   Collection Time: 10/08/23 10:41 AM   Specimen: Stool  Result Value Ref Range Status   Campylobacter species NOT DETECTED NOT DETECTED Final   Plesimonas shigelloides NOT DETECTED NOT DETECTED Final   Salmonella species NOT DETECTED NOT DETECTED Final   Yersinia enterocolitica NOT DETECTED NOT DETECTED Final   Vibrio species NOT DETECTED NOT DETECTED Final   Vibrio cholerae NOT DETECTED NOT DETECTED Final   Enteroaggregative E coli (EAEC) NOT DETECTED NOT DETECTED Final   Enteropathogenic E coli (EPEC) NOT DETECTED NOT DETECTED Final   Enterotoxigenic E coli (ETEC) NOT DETECTED NOT DETECTED Final   Shiga like toxin producing E coli (STEC) NOT DETECTED NOT DETECTED Final   Shigella/Enteroinvasive E coli (EIEC) NOT DETECTED NOT DETECTED Final   Cryptosporidium NOT DETECTED NOT DETECTED Final   Cyclospora cayetanensis NOT DETECTED NOT DETECTED Final   Entamoeba histolytica NOT DETECTED NOT DETECTED Final   Giardia lamblia NOT DETECTED NOT DETECTED Final   Adenovirus F40/41 DETECTED (A) NOT DETECTED Final    Comment: RESULT CALLED TO, READ BACK BY AND VERIFIED WITH: DEVERE FERRARIS, RN 1142 10/09/23 GM    Astrovirus NOT DETECTED NOT DETECTED Final   Norovirus GI/GII DETECTED (A) NOT DETECTED Final    Comment: RESULT CALLED TO, READ BACK BY AND VERIFIED WITH: DEVERE FERRARIS, RN [phone] 10/09/23 GM    Rotavirus A NOT DETECTED NOT DETECTED Final   Sapovirus (I, II, IV, and V) NOT DETECTED NOT DETECTED Final    Comment: Performed at [facility], [address] [place], [place] [address]     Studies: No results found.    Sitlaly Gudiel, MD  [facility] 10/11/2023   If 7PM-7AM, please contact night-coverage

## 2023-10-11 NOTE — Discharge Summary (Signed)
 Physician Discharge Summary  Massimiliano Rohleder FMW:[id number] DOB: 04/19/1946 DOA: 09/25/2023  PCP: Candise Aleene DEL, MD  Admit date: 09/25/2023 Discharge date: 10/11/2023  Admitted From: Home  Discharge disposition: Skilled nursing facility   Recommendations for Outpatient Follow-Up:   Follow up with your primary care provider at the skilled nursing facility in 3 to 5 days. Check CBC, BMP, magnesium  in the next visit Follow-up with [facility] as outpatient when scheduled by the office (ambulatory referral has been made) Follow-up with Dr. Sheldon general surgery in 3 to 4 weeks.   Discharge Diagnosis:   Principal Problem:   SBO (small bowel obstruction) (HCC) Active Problems:   Chronic renal insufficiency, stage III (moderate) (HCC)   Type 2 diabetes mellitus with stage 3a chronic kidney disease, without long-term current use of insulin  (HCC)   Constipation, chronic   Hyponatremia   Leukocytosis   AKI (acute kidney injury) (HCC)   Hypokalemia   MRSA (methicillin resistant Staphylococcus aureus) colonization   Need for emotional support   Goals of care, counseling/discussion   Small bowel obstruction [facility])   Palliative care encounter   Counseling and coordination of care   Ambulatory dysfunction    Discharge Condition: Improved.  Diet recommendation: Soft diet.  Wound care: None.  Code status: Full.   History of Present Illness:   Antonio Serrano is a 78 y.o. male with medical history significant of no reported intra-abdominal surgery, presented hospital with crampy abdominal pain with vomiting for 2 days prior to presentation to the hospital.  In the ED, CT A/P was concerning for SBO. General surgery was consulted.  Due to lack of improvement, patient underwent diagnostic laparoscopy with lysis of adhesion, laparoscopic appendectomy by Dr. Sheldon on 12/29.  At this time, patient has improved   Hospital Course:   Following conditions were  addressed during hospitalization as listed below,  Small bowel obstruction Status post laparoscopic lysis of adhesion, laparoscopic appendectomy on 12/29.  Status post NG tube removal 12/31.  Has been advanced on oral diet and general surgery has signed off since 10/04/2023.  Currently on soft diet.  Advance as tolerated.   AKI Baseline creatinine 1.3, latest creatinine of 1.1.  Has resolved with IV fluids.  Check BMP periodically.   MRSA in urine Urine culture from 12/25 showed MRSA.  ID was consulted.  Thought to be a contaminant.    Diarrhea secondary to norovirus/adenovirus. Improved at this time.  Received 1 dose of Imodium  yesterday.  Feels better.  Could consider Imodium  if needed for further diarrhea.   Hypokalemia Improved after replacement.  Latest potassium of 3.6.  Will give 1 more dose of potassium 40 mEq today.   Prior history of CVA Continue Plavix    Hypertension On low-dose amlodipine .  Will continue on discharge.   Deconditioning debility.  Patient has been seen by physical therapy, plan is for skilled nursing facility placement.  Spoke with the patient's daughter-in-law on 10/10/2023 who indicated that she wanted to have outpatient [place] neurology referral for possible parkinsonism and gait issues.  I have made an ambulatory referral     Disposition.  At this time, patient is stable for disposition to skilled nursing facility.  Medical Consultants:   General surgery  Procedures:    Diagnostic laparoscopy with lysis of adhesions and laparoscopic appendectomy by Dr. Sheldon on 09/29/2023.  Subjective:   Today, patient was seen and examined at bedside.  Feels okay.  Denies any nausea, vomiting, fever, chills or rigor.  Inquiring about  going home.  Discharge Exam:   Vitals:   10/10/23 2147 10/11/23 0612  BP: 117/62 127/65  Pulse: (!) 54 64  Resp: 18 18  Temp: 97.6 F (36.4 C) (!) 97.5 F (36.4 C)  SpO2: 100% 100%   Vitals:   10/10/23 0630 10/10/23 1340  10/10/23 2147 10/11/23 0612  BP: 115/66 117/67 117/62 127/65  Pulse: 72 69 (!) 54 64  Resp: 20 18 18 18   Temp: 98.4 F (36.9 C) 98.6 F (37 C) 97.6 F (36.4 C) (!) 97.5 F (36.4 C)  TempSrc: Oral Oral Oral Oral  SpO2: 99% 100% 100% 100%  Weight:      Height:       Body mass index is 25.82 kg/m.  General: Alert awake, not in obvious distress HENT: pupils equally reacting to light,  No scleral pallor or icterus noted. Oral mucosa is moist.  Chest:  Clear breath sounds.   No crackles or wheezes.  CVS: S1 &S2 heard. No murmur.  Regular rate and rhythm. Abdomen: Soft, nontender, nondistended.  Bowel sounds are heard.  Healed laparoscopic scars. Extremities: No cyanosis, clubbing or edema.  Peripheral pulses are palpable. Psych: Alert, awake and oriented, normal mood CNS:  No cranial nerve deficits.  Power equal in all extremities.   Skin: Warm and dry.  No rashes noted.  The results of significant diagnostics from this hospitalization (including imaging, microbiology, ancillary and laboratory) are listed below for reference.     Diagnostic Studies:   DG Abd Portable 1V-Small Bowel Obstruction Protocol-initial, 8 hr delay Result Date: 09/26/2023 CLINICAL DATA:  8 hour delayed small-bowel follow-through. EXAM: PORTABLE ABDOMEN - 1 VIEW COMPARISON:  Film yesterday at 9:33 p.m., and CT yesterday at 7:12 p.m. FINDINGS: Supine portable AP study of the abdomen was obtained at 7:18 a.m. NGT is in the stomach with the tip in the proximal lumen with the side hole at about the EG junction and should be advanced further in 8-10 cm. There is contrast in the gastric fundus and faintly opacifying dilated small bowel segments in the upper to mid abdomen. There are no visible opacified decompressed bowel segments in the lower abdomen or pelvis and there is no opacification in the ascending colon. Small bowel dilatation in the upper to mid abdomen is little if any changed measuring 4.7 cm. Findings remain  consistent with a high-grade obstruction to the mid to distal small bowel. Moderate retained stool continues to be seen in the ascending colon. There is no supine evidence for free air. Right hip arthroplasty with degenerative change of the spine and osteopenia. Lung bases are clear.  There is aortic atherosclerosis. IMPRESSION: 1. NGT tip in the proximal stomach with the side hole at about the EG junction and should be advanced further in 8-10 cm. 2. Contrast in the gastric fundus and faintly opacifying dilated small bowel segments in the upper to mid abdomen. No visible opacified decompressed bowel segments in the lower abdomen or pelvis and no opacification in the ascending colon. 3. Small bowel dilatation in the upper to mid abdomen is little if any changed measuring 4.7 cm. 4. Findings remain consistent with a high-grade mid to distal SBO. 5. Moderate retained stool in the ascending colon. 6. Aortic atherosclerosis. Electronically Signed   By: Francis Quam M.D.   On: 09/26/2023 07:49   DG CHEST PORT 1 VIEW Result Date: 09/25/2023 CLINICAL DATA:  NG tube positioning. EXAM: PORTABLE CHEST 1 VIEW COMPARISON:  June 14, 2023 FINDINGS: An enteric tube is  seen with its distal tip noted within the body of the stomach. Its distal side hole is approximately 4.3 cm distal to the expected region of the gastroesophageal junction. The heart size and mediastinal contours are within normal limits. Very mild atelectasis and/or infiltrate is suspected within the lateral aspect of the right lung base. No pleural effusion or pneumothorax is identified. A chronic ninth right rib fracture is seen. IMPRESSION: 1. Enteric tube positioning, as described above. 2. Very mild right basilar atelectasis and/or infiltrate. Electronically Signed   By: Suzen Dials M.D.   On: 09/25/2023 22:02   DG Abd Portable 1V-Small Bowel Protocol-Position Verification Result Date: 09/25/2023 CLINICAL DATA:  Enteric tube placement EXAM:  PORTABLE ABDOMEN - 1 VIEW COMPARISON:  Earlier same day abdominal radiograph at 8:25 p.m. FINDINGS: Interval advancement of the enteric tube with tip projecting over the left upper quadrant. The side hole now projects over the gastric cardia. Partially imaged small bowel dilation. IMPRESSION: Interval advancement of the enteric tube with tip projecting over the left upper quadrant. The side hole now projects over the gastric cardia. Further advancement can be considered. Electronically Signed   By: Limin  Xu M.D.   On: 09/25/2023 21:59   DG Abd Portable 1V-Small Bowel Protocol-Position Verification Result Date: 09/25/2023 CLINICAL DATA:  Enteric tube placement EXAM: PORTABLE ABDOMEN - 1 VIEW COMPARISON:  CT abdomen dated 09/25/2023 FINDINGS: Gastric/enteric tube tip projects over the stomach with side hole projecting over the gastroesophageal junction. Partially imaged dilated small bowel throughout the abdomen. IMPRESSION: Gastric/enteric tube tip projects over the stomach with side hole projecting over the gastroesophageal junction. Recommend advancing by at least 5 cm for more optimal positioning. Electronically Signed   By: Limin  Xu M.D.   On: 09/25/2023 20:39   CT ABDOMEN PELVIS WO CONTRAST Result Date: 09/25/2023 CLINICAL DATA:  Nausea, vomiting.  Abdominal pain. EXAM: CT ABDOMEN AND PELVIS WITHOUT CONTRAST TECHNIQUE: Multidetector CT imaging of the abdomen and pelvis was performed following the standard protocol without IV contrast. RADIATION DOSE REDUCTION: This exam was performed according to the departmental dose-optimization program which includes automated exposure control, adjustment of the mA and/or kV according to patient size and/or use of iterative reconstruction technique. COMPARISON:  05/12/2023 FINDINGS: Lower chest: Dependent atelectasis in the lower lobes. Coronary artery and aortic atherosclerosis. Hepatobiliary: No focal hepatic abnormality. Gallbladder unremarkable. Pancreas: No  focal abnormality or ductal dilatation. Spleen: No focal abnormality.  Normal size. Adrenals/Urinary Tract: Adrenal glands unremarkable. Severely atrophic right kidney. Stable cyst in the upper pole of the right kidney and cyst in the mid to lower pole of the left kidney. No follow-up imaging recommended. No stones or hydronephrosis. Urinary bladder unremarkable. Stomach/Bowel: Dilated fluid-filled small bowel loops into the pelvis. Distal small bowel is decompressed. Findings compatible with distal small bowel obstruction. Exact transition point not visualized. Stomach and large bowel unremarkable. Vascular/Lymphatic: Aortic atherosclerosis. No evidence of aneurysm or adenopathy. Reproductive: No visible focal abnormality. Other: No free fluid or free air. Musculoskeletal: No acute bony abnormality. IMPRESSION: Dilated fluid-filled small bowel loops into the pelvis. Distal small bowel decompressed. Findings compatible with distal small bowel obstruction. Aortic atherosclerosis, coronary artery disease. Dependent atelectasis in the lower lobes. Electronically Signed   By: Franky Crease M.D.   On: 09/25/2023 19:44     Labs:   Basic Metabolic Panel: Recent Labs  Lab 10/06/23 1007 10/07/23 0432 10/08/23 1053 10/09/23 0458 10/11/23 0459  NA 132* 132* 130* 132* 133*  K 3.1* 4.1 3.6 3.5 3.6  CL 101 107 103 105 107  CO2 21* 19* 20* 18* 18*  GLUCOSE 82 80 138* 90 77  BUN 14 14 16 16 16   CREATININE 0.95 1.04 1.08 1.17 1.17  CALCIUM  8.2* 8.2* 8.3* 8.0* 8.1*  MG 1.7  --   --   --   --    GFR Estimated Creatinine Clearance: 49.2 mL/min (by C-G formula based on SCr of 1.17 mg/dL). Liver Function Tests: No results for input(s): AST, ALT, ALKPHOS, BILITOT, PROT, ALBUMIN in the last 168 hours. No results for input(s): LIPASE, AMYLASE in the last 168 hours. No results for input(s): AMMONIA in the last 168 hours. Coagulation profile No results for input(s): INR, PROTIME in the last  168 hours.  CBC: Recent Labs  Lab 10/06/23 1007 10/08/23 1053 10/09/23 0458 10/11/23 0459  WBC 9.3 10.5 8.9 7.9  HGB 12.9* 13.4 12.3* 11.7*  HCT 37.2* 38.5* 34.9* 33.4*  MCV 86.7 86.9 85.7 86.1  PLT 327 389 359 373   Cardiac Enzymes: No results for input(s): CKTOTAL, CKMB, CKMBINDEX, TROPONINI in the last 168 hours. BNP: Invalid input(s): POCBNP CBG: No results for input(s): GLUCAP in the last 168 hours. D-Dimer No results for input(s): DDIMER in the last 72 hours. Hgb A1c No results for input(s): HGBA1C in the last 72 hours. Lipid Profile No results for input(s): CHOL, HDL, LDLCALC, TRIG, CHOLHDL, LDLDIRECT in the last 72 hours. Thyroid  function studies No results for input(s): TSH, T4TOTAL, T3FREE, THYROIDAB in the last 72 hours.  Invalid input(s): FREET3 Anemia work up No results for input(s): VITAMINB12, FOLATE, FERRITIN, TIBC, IRON, RETICCTPCT in the last 72 hours. Microbiology Recent Results (from the past 240 hours)  Gastrointestinal Panel by PCR , Stool     Status: Abnormal   Collection Time: 10/08/23 10:41 AM   Specimen: Stool  Result Value Ref Range Status   Campylobacter species NOT DETECTED NOT DETECTED Final   Plesimonas shigelloides NOT DETECTED NOT DETECTED Final   Salmonella species NOT DETECTED NOT DETECTED Final   Yersinia enterocolitica NOT DETECTED NOT DETECTED Final   Vibrio species NOT DETECTED NOT DETECTED Final   Vibrio cholerae NOT DETECTED NOT DETECTED Final   Enteroaggregative E coli (EAEC) NOT DETECTED NOT DETECTED Final   Enteropathogenic E coli (EPEC) NOT DETECTED NOT DETECTED Final   Enterotoxigenic E coli (ETEC) NOT DETECTED NOT DETECTED Final   Shiga like toxin producing E coli (STEC) NOT DETECTED NOT DETECTED Final   Shigella/Enteroinvasive E coli (EIEC) NOT DETECTED NOT DETECTED Final   Cryptosporidium NOT DETECTED NOT DETECTED Final   Cyclospora cayetanensis NOT DETECTED NOT  DETECTED Final   Entamoeba histolytica NOT DETECTED NOT DETECTED Final   Giardia lamblia NOT DETECTED NOT DETECTED Final   Adenovirus F40/41 DETECTED (A) NOT DETECTED Final    Comment: RESULT CALLED TO, READ BACK BY AND VERIFIED WITH: DEVERE FERRARIS, RN 1142 10/09/23 GM    Astrovirus NOT DETECTED NOT DETECTED Final   Norovirus GI/GII DETECTED (A) NOT DETECTED Final    Comment: RESULT CALLED TO, READ BACK BY AND VERIFIED WITH: DEVERE FERRARIS, RN [phone] 10/09/23 GM    Rotavirus A NOT DETECTED NOT DETECTED Final   Sapovirus (I, II, IV, and V) NOT DETECTED NOT DETECTED Final    Comment: Performed at [facility], [address], [place], [place] [address]     Discharge Instructions:   Discharge Instructions     Ambulatory referral to Neurology   Complete by: As directed    Possible parkinsonism, family  wishes assessment for it   Call MD for:  persistant nausea and vomiting   Complete by: As directed    Call MD for:  severe uncontrolled pain   Complete by: As directed    Call MD for:  temperature >100.4   Complete by: As directed    Diet general   Complete by: As directed    Discharge instructions   Complete by: As directed    Follow-up with your primary care provider at the skilled nursing facility in 3 to 5 days..  Seek medical attention for worsening symptoms.   Increase activity slowly   Complete by: As directed       Allergies as of 10/11/2023       Reactions   Bactrim [sulfamethoxazole -trimethoprim ] Nausea Only        Medication List     STOP taking these medications    ciprofloxacin  250 MG tablet Commonly known as: Cipro        TAKE these medications    amLODipine  2.5 MG tablet Commonly known as: NORVASC  Take 1 tablet (2.5 mg total) by mouth daily.   clopidogrel  75 MG tablet Commonly known as: PLAVIX  Take 1 tablet (75 mg total) by mouth daily.   ipratropium 0.03 % nasal spray Commonly known as: ATROVENT  USE 2 SPRAY(S) IN EACH NOSTRIL EVERY 12  HOURS What changed:  how much to take how to take this when to take this reasons to take this additional instructions   Myrbetriq  50 MG Tb24 tablet Generic drug: mirabegron  ER Take 1 tablet by mouth once daily   pantoprazole  40 MG tablet Commonly known as: PROTONIX  Take 1 tablet (40 mg total) by mouth 2 (two) times daily before a meal. What changed: when to take this   polyethylene glycol 17 g packet Commonly known as: MIRALAX  / GLYCOLAX  Take 17 g by mouth 2 (two) times daily. What changed:  when to take this reasons to take this   sodium chloride  1 g tablet Take 1 tablet (1 g total) by mouth 2 (two) times daily with a meal.   sucralfate  1 GM/10ML suspension Commonly known as: CARAFATE  Take 10 mLs (1 g total) by mouth 4 (four) times daily -  with meals and at bedtime.   traZODone  50 MG tablet Commonly known as: DESYREL  TAKE 1 TABLET BY MOUTH EVERY DAY AT BEDTIME AS NEEDED FOR SLEEP        Contact information for follow-up providers     [facility] Follow up.   Why: [facility] will provide PT and OT in the home after discharge.        Sheldon Standing, MD. Schedule an appointment as soon as possible for a visit in 3 week(s).   Specialties: General Surgery, Colon and Rectal Surgery Contact information: [address] [place] [address] [phone]              Contact information for after-discharge care     Destination     [facility]erred [facility] .   Service: Skilled Nursing Contact information: [address] [place]  [address] [phone]                      Time coordinating discharge: 39 minutes  Signed:  Adaliah Hiegel  [facility] 10/11/2023, 2:29 PM

## 2023-10-11 NOTE — Progress Notes (Signed)
 Called report to [employer] to [employer]. Pt d/c by [facility] transportation in stable condition with all belongings.

## 2023-10-11 NOTE — Progress Notes (Signed)
 Physical Therapy Treatment Patient Details Name: Antonio Serrano MRN: [id number] DOB: 10-Feb-1946 Today's Date: 10/11/2023   History of Present Illness Patient is a 78 year old male who presented with mild crampy abdominal pain and more than 10 episodes of vomiting.  In the ER patient's CAT scan is concerning for small bowel obstruction, patient has had nasogastric tube placed. General surgery was consulted.  Due to lack of improvement, patient underwent diagnostic laparoscopy with lysis of adhesion, laparoscopic appendectomy by Dr. Sheldon on 12/29. PMH: TIA, HTN, THA, CKD III    PT Comments  General Comments: AxO x 3 pleasant and willing to amb but wants to get back into bed vs chair due to comfort.  They put you in the chair and leave you forever. Assisted OOB required + 2 asisst and increased effort.  General transfer comment: required + 2 side by side assit from elevated bed plus B Platform EVA walker for increased support. General Gait Details: Used EVA walker for increased support.  Limited amb distance 12 feet with difficulty due to overall weakness and max c/o fatigue.  No energy.  Recliner following behind for safety.  Decreased self WBing thru R LE due to bad foot (hammer Toe).  I like this walker EVA walker, stated Pt.  Profound weakness. Prior pt was living home with Spouse and able to amb with a walker on his own.  Pt will need ST Rehab at SNF to address mobility and functional decline prior to safely returning home.    If plan is discharge home, recommend the following: A lot of help with walking and/or transfers;A lot of help with bathing/dressing/bathroom;Help with stairs or ramp for entrance;Assist for transportation;Assistance with cooking/housework   Can travel by private vehicle     No  Equipment Recommendations  None recommended by PT    Recommendations for Other Services       Precautions / Restrictions       Mobility  Bed Mobility Overal bed mobility: Needs  Assistance Bed Mobility: Supine to Sit, Sit to Supine     Supine to sit: Min assist Sit to supine: Mod assist   General bed mobility comments: with increased time and A to scoot to EOB with increased assist to support B LE back into bed.    Transfers Overall transfer level: Needs assistance   Transfers: Sit to/from Stand Sit to Stand: Max assist, +2 physical assistance, +2 safety/equipment           General transfer comment: required + 2 side by side assit from elevated bed plus B Platform EVA walker for increased support. Transfer via Lift Equipment: Stedy  Ambulation/Gait Ambulation/Gait assistance: Max Chemical Engineer (Feet): 22 Feet Assistive device: Elyn Finder Gait Pattern/deviations: Shuffle, Decreased weight shift to left, Decreased stance time - left, Decreased stride length, Decreased step length - right, Decreased step length - left, Decreased dorsiflexion - left, Decreased dorsiflexion - right Gait velocity: decreased     General Gait Details: Used EVA walker for increased support.  Limited amb distance 12 feet with difficulty due to overall weakness and max c/o fatigue.  No energy.  Recliner following behind for safety.  Decreased self WBing thru R LE due to bad foot (hammer Toe).  I like this walker EVA walker, stated Pt.  Profound weakness.   Stairs             Wheelchair Mobility     Tilt Bed    Modified Rankin (Stroke Patients Only)  Balance                                            Cognition Arousal: Alert   Overall Cognitive Status: Within Functional Limits for tasks assessed                                 General Comments: AxO x 3 pleasant and willing to amb but wants to get back into bed vs chair due to comfort.  They put you in the chair and leave you forever.        Exercises      General Comments        Pertinent Vitals/Pain      Home Living                           Prior Function            PT Goals (current goals can now be found in the care plan section) Progress towards PT goals: Progressing toward goals    Frequency    Min 1X/week      PT Plan      Co-evaluation              AM-PAC PT 6 Clicks Mobility   Outcome Measure  Help needed turning from your back to your side while in a flat bed without using bedrails?: A Lot Help needed moving from lying on your back to sitting on the side of a flat bed without using bedrails?: A Lot Help needed moving to and from a bed to a chair (including a wheelchair)?: A Lot Help needed standing up from a chair using your arms (e.g., wheelchair or bedside chair)?: A Lot Help needed to walk in hospital room?: A Lot Help needed climbing 3-5 steps with a railing? : Total 6 Click Score: 11    End of Session Equipment Utilized During Treatment: Gait belt Activity Tolerance: Patient limited by fatigue Patient left: in chair;with call bell/phone within reach Nurse Communication: Mobility status PT Visit Diagnosis: Other abnormalities of gait and mobility (R26.89);Muscle weakness (generalized) (M62.81);Difficulty in walking, not elsewhere classified (R26.2);Unsteadiness on feet (R26.81)     Time: 9046-8977 PT Time Calculation (min) (ACUTE ONLY): 29 min  Charges:    $Gait Training: 8-22 mins $Therapeutic Activity: 8-22 mins PT General Charges $$ ACUTE PT VISIT: 1 Visit                     Katheryn Leap  PTA Acute  Rehabilitation Services Office M-F          [phone]

## 2023-10-12 DIAGNOSIS — I2510 Atherosclerotic heart disease of native coronary artery without angina pectoris: Secondary | ICD-10-CM | POA: Diagnosis not present

## 2023-10-12 DIAGNOSIS — R296 Repeated falls: Secondary | ICD-10-CM | POA: Diagnosis not present

## 2023-10-12 DIAGNOSIS — R1310 Dysphagia, unspecified: Secondary | ICD-10-CM | POA: Diagnosis not present

## 2023-10-12 DIAGNOSIS — R42 Dizziness and giddiness: Secondary | ICD-10-CM | POA: Diagnosis not present

## 2023-10-12 DIAGNOSIS — E1122 Type 2 diabetes mellitus with diabetic chronic kidney disease: Secondary | ICD-10-CM | POA: Diagnosis not present

## 2023-10-12 DIAGNOSIS — M79651 Pain in right thigh: Secondary | ICD-10-CM | POA: Diagnosis not present

## 2023-10-12 DIAGNOSIS — Z9181 History of falling: Secondary | ICD-10-CM | POA: Diagnosis not present

## 2023-10-12 DIAGNOSIS — K219 Gastro-esophageal reflux disease without esophagitis: Secondary | ICD-10-CM | POA: Diagnosis not present

## 2023-10-12 DIAGNOSIS — N1831 Chronic kidney disease, stage 3a: Secondary | ICD-10-CM | POA: Diagnosis not present

## 2023-10-12 DIAGNOSIS — M6281 Muscle weakness (generalized): Secondary | ICD-10-CM | POA: Diagnosis not present

## 2023-10-12 DIAGNOSIS — N189 Chronic kidney disease, unspecified: Secondary | ICD-10-CM | POA: Diagnosis not present

## 2023-10-12 DIAGNOSIS — K56609 Unspecified intestinal obstruction, unspecified as to partial versus complete obstruction: Secondary | ICD-10-CM | POA: Diagnosis not present

## 2023-10-12 DIAGNOSIS — Z515 Encounter for palliative care: Secondary | ICD-10-CM | POA: Diagnosis not present

## 2023-10-12 DIAGNOSIS — R350 Frequency of micturition: Secondary | ICD-10-CM | POA: Diagnosis not present

## 2023-10-12 DIAGNOSIS — D72829 Elevated white blood cell count, unspecified: Secondary | ICD-10-CM | POA: Diagnosis not present

## 2023-10-12 DIAGNOSIS — Z22322 Carrier or suspected carrier of Methicillin resistant Staphylococcus aureus: Secondary | ICD-10-CM | POA: Diagnosis not present

## 2023-10-12 DIAGNOSIS — K5904 Chronic idiopathic constipation: Secondary | ICD-10-CM | POA: Diagnosis not present

## 2023-10-12 DIAGNOSIS — Z794 Long term (current) use of insulin: Secondary | ICD-10-CM | POA: Diagnosis not present

## 2023-10-12 DIAGNOSIS — N390 Urinary tract infection, site not specified: Secondary | ICD-10-CM | POA: Diagnosis not present

## 2023-10-12 DIAGNOSIS — Z8673 Personal history of transient ischemic attack (TIA), and cerebral infarction without residual deficits: Secondary | ICD-10-CM | POA: Diagnosis not present

## 2023-10-12 DIAGNOSIS — R2681 Unsteadiness on feet: Secondary | ICD-10-CM | POA: Diagnosis not present

## 2023-10-12 DIAGNOSIS — R41841 Cognitive communication deficit: Secondary | ICD-10-CM | POA: Diagnosis not present

## 2023-10-12 DIAGNOSIS — I10 Essential (primary) hypertension: Secondary | ICD-10-CM | POA: Diagnosis not present

## 2023-10-12 DIAGNOSIS — F17290 Nicotine dependence, other tobacco product, uncomplicated: Secondary | ICD-10-CM | POA: Diagnosis not present

## 2023-10-12 DIAGNOSIS — R634 Abnormal weight loss: Secondary | ICD-10-CM | POA: Diagnosis not present

## 2023-10-12 DIAGNOSIS — I679 Cerebrovascular disease, unspecified: Secondary | ICD-10-CM | POA: Diagnosis not present

## 2023-10-12 DIAGNOSIS — R197 Diarrhea, unspecified: Secondary | ICD-10-CM | POA: Diagnosis not present

## 2023-10-12 DIAGNOSIS — H814 Vertigo of central origin: Secondary | ICD-10-CM | POA: Diagnosis not present

## 2023-10-12 DIAGNOSIS — E871 Hypo-osmolality and hyponatremia: Secondary | ICD-10-CM | POA: Diagnosis not present

## 2023-10-12 DIAGNOSIS — E1169 Type 2 diabetes mellitus with other specified complication: Secondary | ICD-10-CM | POA: Diagnosis not present

## 2023-10-12 DIAGNOSIS — M545 Low back pain, unspecified: Secondary | ICD-10-CM | POA: Diagnosis not present

## 2023-10-12 DIAGNOSIS — N411 Chronic prostatitis: Secondary | ICD-10-CM | POA: Diagnosis not present

## 2023-10-12 DIAGNOSIS — E876 Hypokalemia: Secondary | ICD-10-CM | POA: Diagnosis not present

## 2023-10-12 DIAGNOSIS — N179 Acute kidney failure, unspecified: Secondary | ICD-10-CM | POA: Diagnosis not present

## 2023-10-12 DIAGNOSIS — E785 Hyperlipidemia, unspecified: Secondary | ICD-10-CM | POA: Diagnosis not present

## 2023-10-14 DIAGNOSIS — K56609 Unspecified intestinal obstruction, unspecified as to partial versus complete obstruction: Secondary | ICD-10-CM | POA: Diagnosis not present

## 2023-10-14 DIAGNOSIS — N179 Acute kidney failure, unspecified: Secondary | ICD-10-CM | POA: Diagnosis not present

## 2023-10-14 DIAGNOSIS — R197 Diarrhea, unspecified: Secondary | ICD-10-CM | POA: Diagnosis not present

## 2023-10-14 DIAGNOSIS — N189 Chronic kidney disease, unspecified: Secondary | ICD-10-CM | POA: Diagnosis not present

## 2023-10-17 DIAGNOSIS — N189 Chronic kidney disease, unspecified: Secondary | ICD-10-CM | POA: Diagnosis not present

## 2023-10-17 DIAGNOSIS — K56609 Unspecified intestinal obstruction, unspecified as to partial versus complete obstruction: Secondary | ICD-10-CM | POA: Diagnosis not present

## 2023-10-17 DIAGNOSIS — R197 Diarrhea, unspecified: Secondary | ICD-10-CM | POA: Diagnosis not present

## 2023-10-17 DIAGNOSIS — N179 Acute kidney failure, unspecified: Secondary | ICD-10-CM | POA: Diagnosis not present

## 2023-10-18 ENCOUNTER — Telehealth: Payer: Self-pay | Admitting: *Deleted

## 2023-10-18 NOTE — Progress Notes (Signed)
Complex Care Management Care Guide Note  10/18/2023 Name: Radarius Schlicher MRN: [id number] DOB: 22-Sep-1946  Khazi Breckenridge is a 78 y.o. year old male who is a primary care patient of [name], [name], MD and is actively engaged with the care management team. I reached out to [name] by phone today to assist with re-scheduling  with the RN Case Manager.  Follow up plan: per pt wife - pt in rehab for 3 weeks - will f/u after 3 weeks   Burman Nieves, CMA, Care Guide [facility]  [facility], [facility] Guide Direct Dial: [phone]  Fax: [phone] Website: [email or web address]

## 2023-10-21 DIAGNOSIS — N179 Acute kidney failure, unspecified: Secondary | ICD-10-CM | POA: Diagnosis not present

## 2023-10-21 DIAGNOSIS — I2510 Atherosclerotic heart disease of native coronary artery without angina pectoris: Secondary | ICD-10-CM | POA: Diagnosis not present

## 2023-10-21 DIAGNOSIS — E876 Hypokalemia: Secondary | ICD-10-CM | POA: Diagnosis not present

## 2023-10-21 DIAGNOSIS — N189 Chronic kidney disease, unspecified: Secondary | ICD-10-CM | POA: Diagnosis not present

## 2023-10-23 LAB — COMPREHENSIVE METABOLIC PANEL
Calcium: 8.4 — AB (ref 8.7–10.7)
eGFR: 87

## 2023-10-23 LAB — BASIC METABOLIC PANEL
BUN: 9 (ref 4–21)
CO2: 21 (ref 13–22)
Chloride: 103 (ref 99–108)
Creatinine: 0.9 (ref 0.6–1.3)
Glucose: 85
Potassium: 3.7 meq/L (ref 3.5–5.1)
Sodium: 134 — AB (ref 137–147)

## 2023-10-28 DIAGNOSIS — K56609 Unspecified intestinal obstruction, unspecified as to partial versus complete obstruction: Secondary | ICD-10-CM | POA: Diagnosis not present

## 2023-10-28 DIAGNOSIS — I2510 Atherosclerotic heart disease of native coronary artery without angina pectoris: Secondary | ICD-10-CM | POA: Diagnosis not present

## 2023-10-28 DIAGNOSIS — R42 Dizziness and giddiness: Secondary | ICD-10-CM | POA: Diagnosis not present

## 2023-10-28 DIAGNOSIS — R350 Frequency of micturition: Secondary | ICD-10-CM | POA: Diagnosis not present

## 2023-10-29 DIAGNOSIS — R296 Repeated falls: Secondary | ICD-10-CM | POA: Diagnosis not present

## 2023-10-29 DIAGNOSIS — Z515 Encounter for palliative care: Secondary | ICD-10-CM | POA: Diagnosis not present

## 2023-10-29 DIAGNOSIS — R634 Abnormal weight loss: Secondary | ICD-10-CM | POA: Diagnosis not present

## 2023-10-29 DIAGNOSIS — Z993 Dependence on wheelchair: Secondary | ICD-10-CM | POA: Insufficient documentation

## 2023-10-29 DIAGNOSIS — I679 Cerebrovascular disease, unspecified: Secondary | ICD-10-CM | POA: Diagnosis not present

## 2023-11-04 DIAGNOSIS — I2510 Atherosclerotic heart disease of native coronary artery without angina pectoris: Secondary | ICD-10-CM | POA: Diagnosis not present

## 2023-11-04 DIAGNOSIS — K56609 Unspecified intestinal obstruction, unspecified as to partial versus complete obstruction: Secondary | ICD-10-CM | POA: Diagnosis not present

## 2023-11-04 DIAGNOSIS — N390 Urinary tract infection, site not specified: Secondary | ICD-10-CM | POA: Diagnosis not present

## 2023-11-04 DIAGNOSIS — N189 Chronic kidney disease, unspecified: Secondary | ICD-10-CM | POA: Diagnosis not present

## 2023-11-13 NOTE — Progress Notes (Signed)
Complex Care Management Care Guide Note  11/13/2023 Name: Antonio Serrano MRN: [id number] DOB: 10-15-1945  Antonio Serrano is a 78 y.o. year old male who is a primary care patient of [name], [name], MD and is actively engaged with the care management team. I reached out to [name] by phone today to assist with re-scheduling  with the RN Case Manager.  Follow up plan: Unsuccessful telephone outreach attempt made. A HIPAA compliant phone message was left for the patient providing contact information and requesting a return call.  Burman Nieves, CMA, Care Guide [facility]  [facility], [facility] Guide Direct Dial: [phone]  Fax: [phone] Website: [email or web address]

## 2023-11-14 DIAGNOSIS — I2510 Atherosclerotic heart disease of native coronary artery without angina pectoris: Secondary | ICD-10-CM | POA: Diagnosis not present

## 2023-11-14 DIAGNOSIS — I10 Essential (primary) hypertension: Secondary | ICD-10-CM | POA: Diagnosis not present

## 2023-11-14 DIAGNOSIS — N189 Chronic kidney disease, unspecified: Secondary | ICD-10-CM | POA: Diagnosis not present

## 2023-11-14 DIAGNOSIS — N390 Urinary tract infection, site not specified: Secondary | ICD-10-CM | POA: Diagnosis not present

## 2023-11-17 DIAGNOSIS — K219 Gastro-esophageal reflux disease without esophagitis: Secondary | ICD-10-CM | POA: Diagnosis not present

## 2023-11-17 DIAGNOSIS — Z8673 Personal history of transient ischemic attack (TIA), and cerebral infarction without residual deficits: Secondary | ICD-10-CM | POA: Diagnosis not present

## 2023-11-17 DIAGNOSIS — R1310 Dysphagia, unspecified: Secondary | ICD-10-CM | POA: Diagnosis not present

## 2023-11-17 DIAGNOSIS — Z556 Problems related to health literacy: Secondary | ICD-10-CM | POA: Diagnosis not present

## 2023-11-17 DIAGNOSIS — F17210 Nicotine dependence, cigarettes, uncomplicated: Secondary | ICD-10-CM | POA: Diagnosis not present

## 2023-11-17 DIAGNOSIS — B965 Pseudomonas (aeruginosa) (mallei) (pseudomallei) as the cause of diseases classified elsewhere: Secondary | ICD-10-CM | POA: Diagnosis not present

## 2023-11-17 DIAGNOSIS — I129 Hypertensive chronic kidney disease with stage 1 through stage 4 chronic kidney disease, or unspecified chronic kidney disease: Secondary | ICD-10-CM | POA: Diagnosis not present

## 2023-11-17 DIAGNOSIS — E871 Hypo-osmolality and hyponatremia: Secondary | ICD-10-CM | POA: Diagnosis not present

## 2023-11-17 DIAGNOSIS — E785 Hyperlipidemia, unspecified: Secondary | ICD-10-CM | POA: Diagnosis not present

## 2023-11-17 DIAGNOSIS — I2510 Atherosclerotic heart disease of native coronary artery without angina pectoris: Secondary | ICD-10-CM | POA: Diagnosis not present

## 2023-11-17 DIAGNOSIS — E876 Hypokalemia: Secondary | ICD-10-CM | POA: Diagnosis not present

## 2023-11-17 DIAGNOSIS — N390 Urinary tract infection, site not specified: Secondary | ICD-10-CM | POA: Diagnosis not present

## 2023-11-17 DIAGNOSIS — N1831 Chronic kidney disease, stage 3a: Secondary | ICD-10-CM | POA: Diagnosis not present

## 2023-11-17 DIAGNOSIS — E1122 Type 2 diabetes mellitus with diabetic chronic kidney disease: Secondary | ICD-10-CM | POA: Diagnosis not present

## 2023-11-17 DIAGNOSIS — Z7902 Long term (current) use of antithrombotics/antiplatelets: Secondary | ICD-10-CM | POA: Diagnosis not present

## 2023-11-17 DIAGNOSIS — Z48815 Encounter for surgical aftercare following surgery on the digestive system: Secondary | ICD-10-CM | POA: Diagnosis not present

## 2023-11-17 DIAGNOSIS — N411 Chronic prostatitis: Secondary | ICD-10-CM | POA: Diagnosis not present

## 2023-11-17 DIAGNOSIS — M5136 Other intervertebral disc degeneration, lumbar region with discogenic back pain only: Secondary | ICD-10-CM | POA: Diagnosis not present

## 2023-11-17 DIAGNOSIS — Z9049 Acquired absence of other specified parts of digestive tract: Secondary | ICD-10-CM | POA: Diagnosis not present

## 2023-11-17 DIAGNOSIS — K5790 Diverticulosis of intestine, part unspecified, without perforation or abscess without bleeding: Secondary | ICD-10-CM | POA: Diagnosis not present

## 2023-11-18 DIAGNOSIS — E785 Hyperlipidemia, unspecified: Secondary | ICD-10-CM | POA: Diagnosis not present

## 2023-11-18 DIAGNOSIS — N1831 Chronic kidney disease, stage 3a: Secondary | ICD-10-CM | POA: Diagnosis not present

## 2023-11-18 DIAGNOSIS — K219 Gastro-esophageal reflux disease without esophagitis: Secondary | ICD-10-CM | POA: Diagnosis not present

## 2023-11-18 DIAGNOSIS — E871 Hypo-osmolality and hyponatremia: Secondary | ICD-10-CM | POA: Diagnosis not present

## 2023-11-18 DIAGNOSIS — Z48815 Encounter for surgical aftercare following surgery on the digestive system: Secondary | ICD-10-CM | POA: Diagnosis not present

## 2023-11-18 DIAGNOSIS — N411 Chronic prostatitis: Secondary | ICD-10-CM | POA: Diagnosis not present

## 2023-11-18 DIAGNOSIS — F17210 Nicotine dependence, cigarettes, uncomplicated: Secondary | ICD-10-CM | POA: Diagnosis not present

## 2023-11-18 DIAGNOSIS — E1122 Type 2 diabetes mellitus with diabetic chronic kidney disease: Secondary | ICD-10-CM | POA: Diagnosis not present

## 2023-11-18 DIAGNOSIS — I2510 Atherosclerotic heart disease of native coronary artery without angina pectoris: Secondary | ICD-10-CM | POA: Diagnosis not present

## 2023-11-18 DIAGNOSIS — N390 Urinary tract infection, site not specified: Secondary | ICD-10-CM | POA: Diagnosis not present

## 2023-11-18 DIAGNOSIS — R1310 Dysphagia, unspecified: Secondary | ICD-10-CM | POA: Diagnosis not present

## 2023-11-18 DIAGNOSIS — B965 Pseudomonas (aeruginosa) (mallei) (pseudomallei) as the cause of diseases classified elsewhere: Secondary | ICD-10-CM | POA: Diagnosis not present

## 2023-11-18 DIAGNOSIS — E876 Hypokalemia: Secondary | ICD-10-CM | POA: Diagnosis not present

## 2023-11-18 DIAGNOSIS — M5136 Other intervertebral disc degeneration, lumbar region with discogenic back pain only: Secondary | ICD-10-CM | POA: Diagnosis not present

## 2023-11-18 DIAGNOSIS — K5790 Diverticulosis of intestine, part unspecified, without perforation or abscess without bleeding: Secondary | ICD-10-CM | POA: Diagnosis not present

## 2023-11-18 DIAGNOSIS — I129 Hypertensive chronic kidney disease with stage 1 through stage 4 chronic kidney disease, or unspecified chronic kidney disease: Secondary | ICD-10-CM | POA: Diagnosis not present

## 2023-11-19 ENCOUNTER — Other Ambulatory Visit: Payer: Self-pay | Admitting: Family Medicine

## 2023-11-19 MED ORDER — MYRBETRIQ 50 MG PO TB24: 50.0000 mg | ORAL_TABLET | Freq: Every day | ORAL | 0 refills | Status: DC

## 2023-11-19 NOTE — Telephone Encounter (Signed)
Copied from CRM [id number]. Topic: Clinical - Medication Refill >> Nov 19, 2023 10:45 AM [place] J wrote: Most Recent Primary Care Visit:  Provider: Jeoffrey Massed  Department: [facility]  Visit Type: MYCHART VIDEO VISIT  Date: 08/19/2023  Medication: mirabegron 50MG  tablet, 1x daily  Has the patient contacted their pharmacy? Yes (Agent: If no, request that the patient contact the pharmacy for the refill. If patient does not wish to contact the pharmacy document the reason why and proceed with request.) (Agent: If yes, when and what did the pharmacy advise?)  Is this the correct pharmacy for this prescription? Yes If no, delete pharmacy and type the correct one.  This is the patient's preferred pharmacy:  [facility] [address] ([address] [place] - [address] [place] ([place] [address] Phone: [phone] Fax: [phone]   Has the prescription been filled recently? No  Is the patient out of the medication? Yes  Has the patient been seen for an appointment in the last year OR does the patient have an upcoming appointment? Yes  Can we respond through MyChart? Yes  Agent: Please be advised that Rx refills may take up to 3 business days. We ask that you follow-up with your pharmacy.

## 2023-11-20 DIAGNOSIS — Z8719 Personal history of other diseases of the digestive system: Secondary | ICD-10-CM | POA: Diagnosis not present

## 2023-11-20 DIAGNOSIS — K5900 Constipation, unspecified: Secondary | ICD-10-CM | POA: Diagnosis not present

## 2023-11-20 DIAGNOSIS — M6281 Muscle weakness (generalized): Secondary | ICD-10-CM | POA: Diagnosis not present

## 2023-11-21 ENCOUNTER — Telehealth (INDEPENDENT_AMBULATORY_CARE_PROVIDER_SITE_OTHER): Payer: Medicare Other | Admitting: Family Medicine

## 2023-11-21 ENCOUNTER — Encounter: Payer: Self-pay | Admitting: Family Medicine

## 2023-11-21 ENCOUNTER — Telehealth: Payer: Self-pay

## 2023-11-21 VITALS — BP 147/80 | Wt 129.0 lb

## 2023-11-21 DIAGNOSIS — R531 Weakness: Secondary | ICD-10-CM

## 2023-11-21 DIAGNOSIS — N1831 Chronic kidney disease, stage 3a: Secondary | ICD-10-CM

## 2023-11-21 DIAGNOSIS — E1121 Type 2 diabetes mellitus with diabetic nephropathy: Secondary | ICD-10-CM

## 2023-11-21 DIAGNOSIS — D649 Anemia, unspecified: Secondary | ICD-10-CM

## 2023-11-21 DIAGNOSIS — R5381 Other malaise: Secondary | ICD-10-CM

## 2023-11-21 DIAGNOSIS — Z8719 Personal history of other diseases of the digestive system: Secondary | ICD-10-CM

## 2023-11-21 DIAGNOSIS — E119 Type 2 diabetes mellitus without complications: Secondary | ICD-10-CM | POA: Diagnosis not present

## 2023-11-21 NOTE — Telephone Encounter (Signed)
Home health orders received 11/20/23 for [facility] health initiation orders: No.  Home health re-certification orders: Yes. Patient last seen by ordering physician for this condition: 08/19/23. Must be less than 90 days for re-certification and less than 30 days prior for initiation. Visit must have been for the condition the orders are being placed.  Patient meets criteria for Physician to sign orders: No.        Current med list has been attached: No        Orders placed on physicians desk for signature: 11/21/23 (date) If patient does not meet criteria for orders to be signed: pt was called to schedule appt. Appt is scheduled for 11/21/23.   Antonio Serrano

## 2023-11-21 NOTE — Telephone Encounter (Signed)
Provider given HH orders to sign

## 2023-11-21 NOTE — Telephone Encounter (Signed)
Yes, okay.

## 2023-11-21 NOTE — Telephone Encounter (Signed)
Signed and put in box to go up front. Signed:  Santiago Bumpers, MD           11/21/2023

## 2023-11-21 NOTE — Progress Notes (Signed)
Virtual Visit via Video Note  I connected with Antonio Serrano and his daughter [name].  on 11/21/23 at  4:00 PM EST by a video enabled telemedicine application and verified that I am speaking with the correct person using two identifiers.  Location patient: [place] Location provider:work or home office Persons participating in the virtual visit: patient, provider  I discussed the limitations and requested verbal permission for telemedicine visit. The patient expressed understanding and agreed to proceed.  HPI: 78 y/o male being seen today for follow-up chronic debilitation.  States he is doing better overall.  On 09/29/2023 he got laparoscopic appendectomy and lysis of adhesions after being admitted with a small bowel obstruction. SNF upon d/c.  Home 6 days ago. Feels like he is doing well, eating well and he ambulates with his walker at home without problem. He takes his amlodipine every other day because he says daily use results and blood pressure being too low. He has no pain.  No constipation or diarrhea.  He is emptying his bladder well.  ROS as above, plus--> no fevers, no CP, no SOB, no wheezing, no cough, no dizziness, no HAs, no rashes, no melena/hematochezia.  No polyuria or polydipsia.  No myalgias or arthralgias.  No focal weakness, paresthesias, or tremors.  No acute vision or hearing abnormalities.  No dysuria or unusual/new urinary urgency or frequency.  No recent changes in lower legs. No n/v/d or abd pain.  No palpitations.     Past Medical History:  Diagnosis Date   Atherosclerosis of coronary artery    On chest CT->calcif in LAD and RCA   Cerebrovascular disease    03/2023 MRI brain showed chronic microvasc isch changes and small remote infarcts   Chronic low back pain    MR 11/2021 showed L4-5 foraminal stenosis--> plan per Ortho was to do L4-5 injection   Chronic prostatitis    Very mild elevation of PSA (>4), referred to [facility] where PSA repeat was 1.0 on 06/16/13.  Annual PSA  repeat is all that is needed now per urologist.  Most recent 07/2015 was 0.45.   Chronic renal insufficiency, stage III (moderate) (HCC)    Baseline GFR as of 2019= 50s.     Closed right hip fracture (HCC) 03/2021   THA   Diabetes mellitus with complication (HCC) Dx'd fall 2011   Gastritis and gastroduodenitis 06/16/2023   HTN (hypertension)    Renal/aortic doppler u/s normal 06/2010   Hypercalcemia    Hyperlipidemia 2016   Statin intolerant--myalgias.  Pt refuses any further trial of statin as of 2018.   Nonspecific abnormal electrocardiogram (ECG) (EKG) Fall 2011   TWI in inferior leads: myocardial perfusion scan neg and echo normal 07/2010   Pseudomonas aeruginosa colonization    12/2021 urine clx   TIA (transient ischemic attack)    12/2021- CT angio head/neck no high grade stenosis.  MR showed old lacunar infarcts corona radiata, basal ganglia, thalamus.  Echo normal.   Tobacco dependence    UTI (lower urinary tract infection)    Feb 2012 (klebsiella--dx at [facility]); 03/2013 e coli.     Past Surgical History:  Procedure Laterality Date   BIOPSY  06/16/2023   Procedure: BIOPSY;  Surgeon: Meridee Score Netty Starring., MD;  Location: [facility];  Service: Gastroenterology;;   CARDIOVASCULAR STRESS TEST  07/01/2010   Myocardial perfusion scan neg/low risk.   CATARACT EXTRACTION  2007, 2008   Bilateral ([facility] on [address].   COLONOSCOPY  12/20/04;12/2014   2016 no polyps:  recall 5 yrs due to FH of colon ca   ESOPHAGOGASTRODUODENOSCOPY  11/29/2004   esoph stricture/dilation   ESOPHAGOGASTRODUODENOSCOPY N/A 06/16/2023   Procedure: ESOPHAGOGASTRODUODENOSCOPY (EGD);  Surgeon: Lemar Lofty., MD;  Location: [facility];  Service: Gastroenterology;  Laterality: N/A;   LAPAROSCOPY N/A 09/29/2023   Procedure: LAPAROSCOPY DIAGNOSTIC; BILATERAL TAP BLOCK; LAPAROSCOPIC APPENDECTOMY; LYSIS OF ADHESION;  Surgeon: Karie Soda, MD;  Location: [facility];  Service:  General;  Laterality: N/A;   SAVORY DILATION N/A 06/16/2023   Procedure: Gaspar Bidding DILATION;  Surgeon: Lemar Lofty., MD;  Location: [facility];  Service: Gastroenterology;  Laterality: N/A;   TOTAL HIP ARTHROPLASTY Right 04/23/2021   Procedure: TOTAL HIP ARTHROPLASTY ANTERIOR APPROACH;  Surgeon: Cammy Copa, MD;  Location: [facility];  Service: Orthopedics;  Laterality: Right;  Hana, C-arm, Depuy   TRANSTHORACIC ECHOCARDIOGRAM  07/01/2010   2011 EF =>55%; LA mildly dilated; trace MR/TR.  12/2021 EF 60-65%, DD indeterm, valves nl.     Current Outpatient Medications:    amLODipine (NORVASC) 2.5 MG tablet, Take 1 tablet (2.5 mg total) by mouth daily., Disp: 90 tablet, Rfl: 1   clopidogrel (PLAVIX) 75 MG tablet, Take 1 tablet (75 mg total) by mouth daily., Disp: 90 tablet, Rfl: 1   ipratropium (ATROVENT) 0.03 % nasal spray, USE 2 SPRAY(S) IN EACH NOSTRIL EVERY 12 HOURS (Patient taking differently: Place 2 sprays into both nostrils every 12 (twelve) hours as needed for rhinitis.), Disp: 30 mL, Rfl: 11   MYRBETRIQ 50 MG TB24 tablet, Take 1 tablet (50 mg total) by mouth daily., Disp: 30 tablet, Rfl: 0   pantoprazole (PROTONIX) 40 MG tablet, Take 1 tablet (40 mg total) by mouth 2 (two) times daily before a meal. (Patient taking differently: Take 40 mg by mouth daily before breakfast.), Disp: , Rfl:    polyethylene glycol (MIRALAX / GLYCOLAX) 17 g packet, Take 17 g by mouth 2 (two) times daily., Disp: , Rfl:    sodium chloride 1 g tablet, Take 1 tablet (1 g total) by mouth 2 (two) times daily with a meal., Disp: 60 tablet, Rfl: 1   traZODone (DESYREL) 50 MG tablet, TAKE 1 TABLET BY MOUTH EVERY DAY AT BEDTIME AS NEEDED FOR SLEEP, Disp: 90 tablet, Rfl: 1   sucralfate (CARAFATE) 1 GM/10ML suspension, Take 10 mLs (1 g total) by mouth 4 (four) times daily -  with meals and at bedtime. (Patient not taking: Reported on 09/25/2023), Disp: 420 mL, Rfl: 0  EXAM:  VITALS per patient if applicable:      11/21/2023    4:15 PM 10/11/2023    4:06 PM 10/11/2023    6:12 AM  Vitals with BMI  Weight 129 lbs    Systolic 147 134 161  Diastolic 80 68 65  Pulse  73 64     GENERAL: alert, oriented, appears well and in no acute distress  HEENT: atraumatic, conjunttiva clear, no obvious abnormalities on inspection of external nose and ears  NECK: normal movements of the head and neck  LUNGS: on inspection no signs of respiratory distress, breathing rate appears normal, no obvious gross SOB, gasping or wheezing  CV: no obvious cyanosis  MS: moves all visible extremities without noticeable abnormality  PSYCH/NEURO: pleasant and cooperative, no obvious depression or anxiety, speech and thought processing grossly intact  LABS: none today    Chemistry      Component Value Date/Time   NA 133 (L) 10/11/2023 0459   NA 126 (A) 02/17/2019 0000   K  3.6 10/11/2023 0459   CL 107 10/11/2023 0459   CO2 18 (L) 10/11/2023 0459   BUN 16 10/11/2023 0459   BUN 12 02/17/2019 0000   CREATININE 1.17 10/11/2023 0459   CREATININE 1.27 08/09/2023 1435   GLU 100 02/17/2019 0000      Component Value Date/Time   CALCIUM 8.1 (L) 10/11/2023 0459   ALKPHOS 105 09/25/2023 1711   AST 17 09/25/2023 1711   ALT 10 09/25/2023 1711   BILITOT 0.9 09/25/2023 1711   BILITOT 0.3 02/16/2019 0953     Lab Results  Component Value Date   WBC 7.9 10/11/2023   HGB 11.7 (L) 10/11/2023   HCT 33.4 (L) 10/11/2023   MCV 86.1 10/11/2023   PLT 373 10/11/2023   Lab Results  Component Value Date   HGBA1C 6.4 (A) 08/09/2023   HGBA1C 6.4 08/09/2023   HGBA1C 6.4 08/09/2023   HGBA1C 6.4 08/09/2023    ASSESSMENT AND PLAN:  Discussed the following assessment and plan:  #1 debilitated patient. He is making good progress.  He ambulates with a walker.  2.  Small bowel obstruction--> he is status post lysis of adhesions and appendectomy. Eating and drinking normally now, gaining weight.  #3 hypertension,  well-controlled taking amlodipine 2.5 mg every other day.  4.  Chronic renal insufficiency stage III Serum creatinine stable at 1.17 on the day of discharge to SNF. Will see if we can get any lab results from [facility].  5.  Diabetes with nephropathy. Well-controlled--> hemoglobin A1c 6.4% in November 2024. No meds. Will try to get any recent labs from [facility].  I discussed the assessment and treatment plan with the patient. The patient was provided an opportunity to ask questions and all were answered. The patient agreed with the plan and demonstrated an understanding of the instructions.   F/u: 6 weeks  Signed:  Santiago Bumpers, MD           11/21/2023

## 2023-11-23 DIAGNOSIS — N390 Urinary tract infection, site not specified: Secondary | ICD-10-CM | POA: Diagnosis not present

## 2023-11-23 DIAGNOSIS — E785 Hyperlipidemia, unspecified: Secondary | ICD-10-CM | POA: Diagnosis not present

## 2023-11-23 DIAGNOSIS — I2510 Atherosclerotic heart disease of native coronary artery without angina pectoris: Secondary | ICD-10-CM | POA: Diagnosis not present

## 2023-11-23 DIAGNOSIS — R1310 Dysphagia, unspecified: Secondary | ICD-10-CM | POA: Diagnosis not present

## 2023-11-23 DIAGNOSIS — E1122 Type 2 diabetes mellitus with diabetic chronic kidney disease: Secondary | ICD-10-CM | POA: Diagnosis not present

## 2023-11-23 DIAGNOSIS — M5136 Other intervertebral disc degeneration, lumbar region with discogenic back pain only: Secondary | ICD-10-CM | POA: Diagnosis not present

## 2023-11-23 DIAGNOSIS — K219 Gastro-esophageal reflux disease without esophagitis: Secondary | ICD-10-CM | POA: Diagnosis not present

## 2023-11-23 DIAGNOSIS — B965 Pseudomonas (aeruginosa) (mallei) (pseudomallei) as the cause of diseases classified elsewhere: Secondary | ICD-10-CM | POA: Diagnosis not present

## 2023-11-23 DIAGNOSIS — N1831 Chronic kidney disease, stage 3a: Secondary | ICD-10-CM | POA: Diagnosis not present

## 2023-11-23 DIAGNOSIS — E876 Hypokalemia: Secondary | ICD-10-CM | POA: Diagnosis not present

## 2023-11-23 DIAGNOSIS — N411 Chronic prostatitis: Secondary | ICD-10-CM | POA: Diagnosis not present

## 2023-11-23 DIAGNOSIS — F17210 Nicotine dependence, cigarettes, uncomplicated: Secondary | ICD-10-CM | POA: Diagnosis not present

## 2023-11-23 DIAGNOSIS — K5790 Diverticulosis of intestine, part unspecified, without perforation or abscess without bleeding: Secondary | ICD-10-CM | POA: Diagnosis not present

## 2023-11-23 DIAGNOSIS — I129 Hypertensive chronic kidney disease with stage 1 through stage 4 chronic kidney disease, or unspecified chronic kidney disease: Secondary | ICD-10-CM | POA: Diagnosis not present

## 2023-11-23 DIAGNOSIS — E871 Hypo-osmolality and hyponatremia: Secondary | ICD-10-CM | POA: Diagnosis not present

## 2023-11-23 DIAGNOSIS — Z48815 Encounter for surgical aftercare following surgery on the digestive system: Secondary | ICD-10-CM | POA: Diagnosis not present

## 2023-11-27 NOTE — Progress Notes (Signed)
 Complex Care Management Care Guide Note  11/27/2023 Name: [name] MRN: [id number] DOB: 23-Jan-1946  Antonio Serrano is a 78 y.o. year old male who is a primary care patient of [name], [name], MD and is actively engaged with the care management team. I reached out to [name] by phone today to assist with re-scheduling  with the RN Case Manager.  Follow up plan: Telephone appointment with complex care management team member scheduled for:  12/16/2023  Burman Nieves, CMA, Care Guide [facility], [facility] Guide Direct Dial: [phone]  Fax: [phone] Website: [email or web address]

## 2023-11-30 DIAGNOSIS — N1831 Chronic kidney disease, stage 3a: Secondary | ICD-10-CM | POA: Diagnosis not present

## 2023-11-30 DIAGNOSIS — R1310 Dysphagia, unspecified: Secondary | ICD-10-CM | POA: Diagnosis not present

## 2023-11-30 DIAGNOSIS — Z48815 Encounter for surgical aftercare following surgery on the digestive system: Secondary | ICD-10-CM | POA: Diagnosis not present

## 2023-11-30 DIAGNOSIS — E876 Hypokalemia: Secondary | ICD-10-CM | POA: Diagnosis not present

## 2023-11-30 DIAGNOSIS — N411 Chronic prostatitis: Secondary | ICD-10-CM | POA: Diagnosis not present

## 2023-11-30 DIAGNOSIS — F17210 Nicotine dependence, cigarettes, uncomplicated: Secondary | ICD-10-CM | POA: Diagnosis not present

## 2023-11-30 DIAGNOSIS — I2510 Atherosclerotic heart disease of native coronary artery without angina pectoris: Secondary | ICD-10-CM | POA: Diagnosis not present

## 2023-11-30 DIAGNOSIS — E871 Hypo-osmolality and hyponatremia: Secondary | ICD-10-CM | POA: Diagnosis not present

## 2023-11-30 DIAGNOSIS — K219 Gastro-esophageal reflux disease without esophagitis: Secondary | ICD-10-CM | POA: Diagnosis not present

## 2023-11-30 DIAGNOSIS — K5790 Diverticulosis of intestine, part unspecified, without perforation or abscess without bleeding: Secondary | ICD-10-CM | POA: Diagnosis not present

## 2023-11-30 DIAGNOSIS — B965 Pseudomonas (aeruginosa) (mallei) (pseudomallei) as the cause of diseases classified elsewhere: Secondary | ICD-10-CM | POA: Diagnosis not present

## 2023-11-30 DIAGNOSIS — N390 Urinary tract infection, site not specified: Secondary | ICD-10-CM | POA: Diagnosis not present

## 2023-11-30 DIAGNOSIS — M5136 Other intervertebral disc degeneration, lumbar region with discogenic back pain only: Secondary | ICD-10-CM | POA: Diagnosis not present

## 2023-11-30 DIAGNOSIS — E785 Hyperlipidemia, unspecified: Secondary | ICD-10-CM | POA: Diagnosis not present

## 2023-11-30 DIAGNOSIS — E1122 Type 2 diabetes mellitus with diabetic chronic kidney disease: Secondary | ICD-10-CM | POA: Diagnosis not present

## 2023-11-30 DIAGNOSIS — I129 Hypertensive chronic kidney disease with stage 1 through stage 4 chronic kidney disease, or unspecified chronic kidney disease: Secondary | ICD-10-CM | POA: Diagnosis not present

## 2023-12-03 DIAGNOSIS — F17210 Nicotine dependence, cigarettes, uncomplicated: Secondary | ICD-10-CM | POA: Diagnosis not present

## 2023-12-03 DIAGNOSIS — I129 Hypertensive chronic kidney disease with stage 1 through stage 4 chronic kidney disease, or unspecified chronic kidney disease: Secondary | ICD-10-CM | POA: Diagnosis not present

## 2023-12-03 DIAGNOSIS — E876 Hypokalemia: Secondary | ICD-10-CM | POA: Diagnosis not present

## 2023-12-03 DIAGNOSIS — E785 Hyperlipidemia, unspecified: Secondary | ICD-10-CM | POA: Diagnosis not present

## 2023-12-03 DIAGNOSIS — I2510 Atherosclerotic heart disease of native coronary artery without angina pectoris: Secondary | ICD-10-CM | POA: Diagnosis not present

## 2023-12-03 DIAGNOSIS — N411 Chronic prostatitis: Secondary | ICD-10-CM | POA: Diagnosis not present

## 2023-12-03 DIAGNOSIS — E871 Hypo-osmolality and hyponatremia: Secondary | ICD-10-CM | POA: Diagnosis not present

## 2023-12-03 DIAGNOSIS — Z48815 Encounter for surgical aftercare following surgery on the digestive system: Secondary | ICD-10-CM | POA: Diagnosis not present

## 2023-12-03 DIAGNOSIS — E1122 Type 2 diabetes mellitus with diabetic chronic kidney disease: Secondary | ICD-10-CM | POA: Diagnosis not present

## 2023-12-03 DIAGNOSIS — N1831 Chronic kidney disease, stage 3a: Secondary | ICD-10-CM | POA: Diagnosis not present

## 2023-12-03 DIAGNOSIS — K219 Gastro-esophageal reflux disease without esophagitis: Secondary | ICD-10-CM | POA: Diagnosis not present

## 2023-12-03 DIAGNOSIS — B965 Pseudomonas (aeruginosa) (mallei) (pseudomallei) as the cause of diseases classified elsewhere: Secondary | ICD-10-CM | POA: Diagnosis not present

## 2023-12-03 DIAGNOSIS — K5790 Diverticulosis of intestine, part unspecified, without perforation or abscess without bleeding: Secondary | ICD-10-CM | POA: Diagnosis not present

## 2023-12-03 DIAGNOSIS — M5136 Other intervertebral disc degeneration, lumbar region with discogenic back pain only: Secondary | ICD-10-CM | POA: Diagnosis not present

## 2023-12-03 DIAGNOSIS — R1310 Dysphagia, unspecified: Secondary | ICD-10-CM | POA: Diagnosis not present

## 2023-12-03 DIAGNOSIS — N390 Urinary tract infection, site not specified: Secondary | ICD-10-CM | POA: Diagnosis not present

## 2023-12-04 ENCOUNTER — Ambulatory Visit: Payer: Medicare Other | Admitting: *Deleted

## 2023-12-04 DIAGNOSIS — Z0000 Encounter for general adult medical examination without abnormal findings: Secondary | ICD-10-CM | POA: Diagnosis not present

## 2023-12-04 NOTE — Progress Notes (Signed)
 Subjective:   [name] Bergen is a 78 y.o. male who presents for Medicare Annual/Subsequent preventive examination.  Visit Complete: Virtual I connected with  [name] on 12/04/23 by a audio enabled telemedicine application and verified that I am speaking with the correct person using two identifiers.  Patient Location: Home  Provider Location: Home Office  I discussed the limitations of evaluation and management by telemedicine. The patient expressed understanding and agreed to proceed.  Vital Signs: Because this visit was a virtual/telehealth visit, some criteria may be missing or patient reported. Any vitals not documented were not able to be obtained and vitals that have been documented are patient reported.   Cardiac Risk Factors include: advanced age (>21men, >69 women);diabetes mellitus;male gender;hypertension;smoking/ tobacco exposure     Objective:    There were no vitals filed for this visit. There is no height or weight on file to calculate BMI.     12/04/2023   10:00 AM 09/25/2023    3:16 PM 06/14/2023    8:00 PM 04/25/2023    6:51 PM 04/19/2023   10:28 AM 03/18/2023   12:58 PM 11/28/2022   10:32 AM  Advanced Directives  Does Patient Have a Medical Advance Directive? Yes No Yes No No Yes Yes  Type of Advance Directive Healthcare Power of Attorney  Living will   Living will Healthcare Power of [place];Living will  Does patient want to make changes to medical advance directive?   No - Patient declined      Copy of Healthcare Power of Attorney in Chart? No - copy requested      No - copy requested  Would patient like information on creating a medical advance directive?  No - Patient declined  No - Patient declined No - Patient declined      Current Medications (verified) Outpatient Encounter Medications as of 12/04/2023  Medication Sig   amLODipine (NORVASC) 2.5 MG tablet Take 1 tablet (2.5 mg total) by mouth daily.   clopidogrel (PLAVIX) 75 MG tablet Take 1 tablet  (75 mg total) by mouth daily.   ipratropium (ATROVENT) 0.03 % nasal spray USE 2 SPRAY(S) IN EACH NOSTRIL EVERY 12 HOURS (Patient taking differently: Place 2 sprays into both nostrils every 12 (twelve) hours as needed for rhinitis.)   MYRBETRIQ 50 MG TB24 tablet Take 1 tablet (50 mg total) by mouth daily.   pantoprazole (PROTONIX) 40 MG tablet Take 1 tablet (40 mg total) by mouth 2 (two) times daily before a meal. (Patient taking differently: Take 40 mg by mouth daily before breakfast.)   polyethylene glycol (MIRALAX / GLYCOLAX) 17 g packet Take 17 g by mouth 2 (two) times daily.   sodium chloride 1 g tablet Take 1 tablet (1 g total) by mouth 2 (two) times daily with a meal.   traZODone (DESYREL) 50 MG tablet TAKE 1 TABLET BY MOUTH EVERY DAY AT BEDTIME AS NEEDED FOR SLEEP   sucralfate (CARAFATE) 1 GM/10ML suspension Take 10 mLs (1 g total) by mouth 4 (four) times daily -  with meals and at bedtime. (Patient not taking: Reported on 09/25/2023)   No facility-administered encounter medications on file as of 12/04/2023.    Allergies (verified) Bactrim [sulfamethoxazole-trimethoprim]   History: Past Medical History:  Diagnosis Date   Atherosclerosis of coronary artery    On chest CT->calcif in LAD and RCA   Cerebrovascular disease    03/2023 MRI brain showed chronic microvasc isch changes and small remote infarcts   Chronic low back pain  MR 11/2021 showed L4-5 foraminal stenosis--> plan per Ortho was to do L4-5 injection   Chronic prostatitis    Very mild elevation of PSA (>4), referred to [facility] where PSA repeat was 1.0 on 06/16/13.  Annual PSA repeat is all that is needed now per urologist.  Most recent 07/2015 was 0.45.   Chronic renal insufficiency, stage III (moderate) (HCC)    Baseline GFR as of 2019= 50s.     Closed right hip fracture (HCC) 03/2021   THA   Diabetes mellitus with complication (HCC) Dx'd fall 2011   Gastritis and gastroduodenitis 06/16/2023   HTN (hypertension)     Renal/aortic doppler u/s normal 06/2010   Hypercalcemia    Hyperlipidemia 2016   Statin intolerant--myalgias.  Pt refuses any further trial of statin as of 2018.   Nonspecific abnormal electrocardiogram (ECG) (EKG) Fall 2011   TWI in inferior leads: myocardial perfusion scan neg and echo normal 07/2010   PAF (paroxysmal atrial fibrillation) (HCC)    2024   Pseudomonas aeruginosa colonization    12/2021 urine clx   TIA (transient ischemic attack)    12/2021- CT angio head/neck no high grade stenosis.  MR showed old lacunar infarcts corona radiata, basal ganglia, thalamus.  Echo normal.   Tobacco dependence    UTI (lower urinary tract infection)    Feb 2012 (klebsiella--dx at [facility]); 03/2013 e coli.    Past Surgical History:  Procedure Laterality Date   BIOPSY  06/16/2023   Procedure: BIOPSY;  Surgeon: Meridee Score Netty Starring., MD;  Location: [facility];  Service: Gastroenterology;;   CARDIOVASCULAR STRESS TEST  07/01/2010   Myocardial perfusion scan neg/low risk.   CATARACT EXTRACTION  2007, 2008   Bilateral ([facility] [address].   COLONOSCOPY  12/20/04;12/2014   2016 no polyps: recall 5 yrs due to FH of colon ca   ESOPHAGOGASTRODUODENOSCOPY  11/29/2004   esoph stricture/dilation   ESOPHAGOGASTRODUODENOSCOPY N/A 06/16/2023   Procedure: ESOPHAGOGASTRODUODENOSCOPY (EGD);  Surgeon: Lemar Lofty., MD;  Location: [facility];  Service: Gastroenterology;  Laterality: N/A;   LAPAROSCOPY N/A 09/29/2023   Procedure: LAPAROSCOPY DIAGNOSTIC; BILATERAL TAP BLOCK; LAPAROSCOPIC APPENDECTOMY; LYSIS OF ADHESION;  Surgeon: Karie Soda, MD;  Location: [facility];  Service: General;  Laterality: N/A;   SAVORY DILATION N/A 06/16/2023   Procedure: Gaspar Bidding DILATION;  Surgeon: Lemar Lofty., MD;  Location: [facility];  Service: Gastroenterology;  Laterality: N/A;   TOTAL HIP ARTHROPLASTY Right 04/23/2021   Procedure: TOTAL HIP ARTHROPLASTY ANTERIOR APPROACH;  Surgeon:  Cammy Copa, MD;  Location: [facility];  Service: Orthopedics;  Laterality: Right;  Hana, C-arm, Depuy   TRANSTHORACIC ECHOCARDIOGRAM  07/01/2010   2011 EF =>55%; LA mildly dilated; trace MR/TR.  12/2021 EF 60-65%, DD indeterm, valves nl.   Family History  Problem Relation Age of Onset   Dementia Mother    Pneumonia Father        died in 70's of pneumonia, was otherwise healthy   Cancer Sister        colon cancer.  Died age 88   Diabetes Paternal Aunt    Diabetes Sister    Colon cancer Neg Hx    Social History   Socioeconomic History   Marital status: Married    Spouse name: Not on file   Number of children: Not on file   Years of education: Not on file   Highest education level: Not on file  Occupational History   Not on file  Tobacco Use   Smoking  status: Every Day    Current packs/day: 0.25    Average packs/day: 0.3 packs/day for 50.0 years (12.5 ttl pk-yrs)    Types: Cigarettes   Smokeless tobacco: Never  Vaping Use   Vaping status: Never Used  Substance and Sexual Activity   Alcohol use: Not Currently    Alcohol/week: 0.0 standard drinks of alcohol    Comment: rare alcohol   Drug use: No   Sexual activity: Not Currently  Other Topics Concern   Not on file  Social History Narrative   Married, 4 grown children, 6 grandchildren.   Works in Production designer, theatre/television/film at [employer] in [place], [place].  Worked in [employer] all his life.   Lives in [place].   25 pack-yr tobacco hx--current as of 05/2014.   Drinks about a six pack per week.  Enjoys golf--six handicap at one point.  No formal exercise.   Walks a lot for his job.         Social Drivers of Corporate investment banker Strain: Low Risk  (12/04/2023)   Overall Financial Resource Strain (CARDIA)    Difficulty of Paying Living Expenses: Not hard at all  Food Insecurity: No Food Insecurity (12/04/2023)   Hunger Vital Sign    Worried About Running Out of Food in the Last Year: Never true    Ran Out of Food in the  Last Year: Never true  Transportation Needs: No Transportation Needs (12/04/2023)   PRAPARE - Administrator, Civil Service (Medical): No    Lack of Transportation (Non-Medical): No  Physical Activity: Inactive (12/04/2023)   Exercise Vital Sign    Days of Exercise per Week: 0 days    Minutes of Exercise per Session: 0 min  Stress: No Stress Concern Present (12/04/2023)   [employer] of Occupational Health - Occupational Stress Questionnaire    Feeling of Stress : Not at all  Social Connections: Moderately Isolated (12/04/2023)   Social Connection and Isolation Panel [NHANES]    Frequency of Communication with Friends and Family: More than three times a week    Frequency of Social Gatherings with Friends and Family: More than three times a week    Attends Religious Services: Never    Database administrator or Organizations: No    Attends Engineer, structural: Never    Marital Status: Married    Tobacco Counseling Ready to quit: Not Answered Counseling given: Not Answered   Clinical Intake:  Pre-visit preparation completed: Yes  Pain : No/denies pain     Diabetes: Yes CBG done?: No Did pt. bring in CBG monitor from home?: No  How often do you need to have someone help you when you read instructions, pamphlets, or other written materials from your doctor or pharmacy?: 1 - Never  Interpreter Needed?: No  Information entered by :: Remi Haggard LPN   Activities of Daily Living    12/04/2023   10:07 AM 09/25/2023   10:35 PM  In your present state of health, do you have any difficulty performing the following activities:  Hearing?  0  Vision? 0 0  Difficulty concentrating or making decisions? 0 0  Walking or climbing stairs? 1   Dressing or bathing? 0   Doing errands, shopping? 1 1  Preparing Food and eating ? N   Using the Toilet? N   In the past six months, have you accidently leaked urine? N   Do you have problems with loss of bowel control? N  Managing your Medications? N   Managing your Finances? N   Housekeeping or managing your Housekeeping? N     Patient Care Team: Jeoffrey Massed, MD as PCP - General (Family Medicine) Leilani Able (Dentistry) Fredrich Birks, OD as Consulting Physician (Optometry) Annie Sable, MD as Consulting Physician (Nephrology) [facility], Rachelle Hora, MD as Consulting Physician (Cardiology) Ricky Stabs, RN as VBCI Care Management (General Practice)  Indicate any recent Medical Services you may have received from other than [facility] providers in the past year (date may be approximate).     Assessment:   This is a routine wellness examination for Deran.  Hearing/Vision screen Hearing Screening - Comments:: Does not wear hearing aids No trouble hearing Vision Screening - Comments:: Has appointment scheduled [facility]   Goals Addressed             This Visit's Progress    Patient Stated       Getting leg better       Depression Screen    12/04/2023   10:06 AM 07/30/2023   12:26 PM 07/30/2023   12:22 PM 07/03/2023    1:12 PM 06/10/2023   11:00 AM 04/17/2023    1:44 PM 02/11/2023    2:19 PM  PHQ 2/9 Scores  PHQ - 2 Score 0   1 5 3 3   PHQ- 9 Score 2   10 16 14 9   Exception Documentation   Other- indicate reason in comment box      Not completed  Wife does assessment Wife does asssessment        Fall Risk    12/04/2023    9:58 AM 08/28/2023   11:55 AM 07/30/2023   12:22 PM 07/03/2023    1:11 PM 06/10/2023   11:01 AM  Fall Risk   Falls in the past year? 0 1 1 1 1   Number falls in past yr: 0 1 1 1  0  Injury with Fall? 0 1 0 1 0  Risk for fall due to : Impaired balance/gait;Impaired mobility History of fall(s);Impaired balance/gait;Impaired mobility Impaired balance/gait History of fall(s) No Fall Risks  Follow up Falls evaluation completed;Education provided;Falls prevention discussed Falls evaluation completed;Education provided Falls prevention discussed  Falls evaluation completed;Falls prevention discussed Falls evaluation completed    MEDICARE RISK AT HOME: Medicare Risk at Home Any stairs in or around the home?: No If so, are there any without handrails?: No Home free of loose throw rugs in walkways, pet beds, electrical cords, etc?: Yes Adequate lighting in your home to reduce risk of falls?: Yes Life alert?: No Use of a cane, walker or w/c?: Yes Grab bars in the bathroom?: No Shower chair or bench in shower?: Yes Elevated toilet seat or a handicapped toilet?: Yes  TIMED UP AND GO:  Was the test performed?  No    Cognitive Function:    01/10/2018   10:09 AM  MMSE - Mini Mental State Exam  Orientation to time 5  Orientation to Place 5  Registration 3  Attention/ Calculation 5  Recall 3  Language- name 2 objects 2  Language- repeat 1  Language- follow 3 step command 3  Language- read & follow direction 1  Write a sentence 1  Copy design 1  Total score 30        12/04/2023   10:01 AM 11/28/2022   10:34 AM 11/22/2021   10:23 AM  6CIT Screen  What Year? 0 points 0 points 0 points  What month? 0  points 0 points 0 points  What time? 0 points 0 points 0 points  Count back from 20 0 points 0 points 0 points  Months in reverse 0 points 4 points 0 points  Repeat phrase 2 points 2 points 6 points  Total Score 2 points 6 points 6 points    Immunizations Immunization History  Administered Date(s) Administered   Fluad Quad(high Dose 65+) 06/18/2019, 09/16/2020, 07/13/2021, 07/19/2022   Fluad Trivalent(High Dose 65+) 06/10/2023   Influenza, High Dose Seasonal PF 06/21/2017, 06/20/2018   Influenza,inj,Quad PF,6+ Mos 05/31/2014, 06/27/2015   Influenza-Unspecified 09/04/2016   PFIZER(Purple Top)SARS-COV-2 Vaccination 11/09/2019, 12/03/2019, 08/04/2020   Pneumococcal Conjugate-13 05/18/2014   Pneumococcal Polysaccharide-23 07/29/2015    TDAP status: Due, Education has been provided regarding the importance of this  vaccine. Advised may receive this vaccine at local pharmacy or Health Dept. Aware to provide a copy of the vaccination record if obtained from local pharmacy or Health Dept. Verbalized acceptance and understanding.  Flu Vaccine status: Up to date  Pneumococcal vaccine status: Up to date  Covid-19 vaccine status: Information provided on how to obtain vaccines.   Qualifies for Shingles Vaccine? Yes   Zostavax completed No   Shingrix Completed?: No.    Education has been provided regarding the importance of this vaccine. Patient has been advised to call insurance company to determine out of pocket expense if they have not yet received this vaccine. Advised may also receive vaccine at local pharmacy or Health Dept. Verbalized acceptance and understanding.  Screening Tests Health Maintenance  Topic Date Due   Zoster Vaccines- Shingrix (1 of 2) Never done   Colonoscopy  01/20/2020   FOOT EXAM  07/20/2023   COVID-19 Vaccine (4 - 2024-25 season) 12/07/2023 (Originally 06/02/2023)   OPHTHALMOLOGY EXAM  07/15/2024 (Originally 08/16/2018)   HEMOGLOBIN A1C  02/06/2024   Pneumonia Vaccine 78+ Years old  Completed   INFLUENZA VACCINE  Completed   Hepatitis C Screening  Completed   HPV VACCINES  Aged Out   DTaP/Tdap/Td  Discontinued    Health Maintenance  Health Maintenance Due  Topic Date Due   Zoster Vaccines- Shingrix (1 of 2) Never done   Colonoscopy  01/20/2020   FOOT EXAM  07/20/2023    Colorectal cancer screening: No longer required.   Lung Cancer Screening: (Low Dose CT Chest recommended if Age 76-80 years, 20 pack-year currently smoking OR have quit w/in 15years.) does not qualify.   Lung Cancer Screening Referral:   Additional Screening:  Hepatitis C Screening: does not qualify; Completed 2018  Vision Screening: Recommended annual ophthalmology exams for early detection of glaucoma and other disorders of the eye. Is the patient up to date with their annual eye exam?  Yes   Who is the provider or what is the name of the office in which the patient attends annual eye exams? [facility] If pt is not established with a provider, would they like to be referred to a provider to establish care? No .   Dental Screening: Recommended annual dental exams for proper oral hygiene  Nutrition Risk Assessment:  Has the patient had any N/V/D within the last 2 months?  No  Does the patient have any non-healing wounds?  No  Has the patient had any unintentional weight loss or weight gain?  No   Diabetes:  Is the patient diabetic?  Yes  If diabetic, was a CBG obtained today?  No  Did the patient bring in their glucometer from home?  No  How  often do you monitor your CBG's? Every three days.   Financial Strains and Diabetes Management:  Are you having any financial strains with the device, your supplies or your medication? No .  Does the patient want to be seen by Chronic Care Management for management of their diabetes?  No  Would the patient like to be referred to a Nutritionist or for Diabetic Management?  No   Diabetic Exams:  Diabetic Eye Exam:  has an appointment scheduled. Overdue for diabetic eye exam. Pt has been advised about the importance in completing this exam  Diabetic Foot Exam: . Pt has been advised about the importance in completing this exam..    Community Resource Referral / Chronic Care Management: CRR required this visit?  No   CCM required this visit?  No     Plan:     I have personally reviewed and noted the following in the patient's chart:   Medical and social history Use of alcohol, tobacco or illicit drugs  Current medications and supplements including opioid prescriptions. Patient is not currently taking opioid prescriptions. Functional ability and status Nutritional status Physical activity Advanced directives List of other physicians Hospitalizations, surgeries, and ER visits in previous 12 months Vitals Screenings to include  cognitive, depression, and falls Referrals and appointments  In addition, I have reviewed and discussed with patient certain preventive protocols, quality metrics, and best practice recommendations. A written personalized care plan for preventive services as well as general preventive health recommendations were provided to patient.     Remi Haggard, LPN   10/06/1094   After Visit Summary: (MyChart) Due to this being a telephonic visit, the after visit summary with patients personalized plan was offered to patient via MyChart   Nurse Notes:

## 2023-12-04 NOTE — Patient Instructions (Signed)
 Antonio Serrano , Thank you for taking time to come for your Medicare Wellness Visit. I appreciate your ongoing commitment to your health goals. Please review the following plan we discussed and let me know if I can assist you in the future.   Screening recommendations/referrals: Colonoscopy: no longer required Recommended yearly ophthalmology/optometry visit for glaucoma screening and checkup Recommended yearly dental visit for hygiene and checkup  Vaccinations: Influenza vaccine: up to date Pneumococcal vaccine: up to date Tdap vaccine: Education provided Shingles vaccine: Education provided     Preventive Care 65 Years and Older, Male Preventive care refers to lifestyle choices and visits with your health care provider that can promote health and wellness. What does preventive care include? A yearly physical exam. This is also called an annual well check. Dental exams once or twice a year. Routine eye exams. Ask your health care provider how often you should have your eyes checked. Personal lifestyle choices, including: Daily care of your teeth and gums. Regular physical activity. Eating a healthy diet. Avoiding tobacco and drug use. Limiting alcohol use. Practicing safe sex. Taking low doses of aspirin every day. Taking vitamin and mineral supplements as recommended by your health care provider. What happens during an annual well check? The services and screenings done by your health care provider during your annual well check will depend on your age, overall health, lifestyle risk factors, and family history of disease. Counseling  Your health care provider may ask you questions about your: Alcohol use. Tobacco use. Drug use. Emotional well-being. Home and relationship well-being. Sexual activity. Eating habits. History of falls. Memory and ability to understand (cognition). Work and work Astronomer. Screening  You may have the following tests or measurements: Height,  weight, and BMI. Blood pressure. Lipid and cholesterol levels. These may be checked every 5 years, or more frequently if you are over 58 years old. Skin check. Lung cancer screening. You may have this screening every year starting at age 42 if you have a 30-pack-year history of smoking and currently smoke or have quit within the past 15 years. Fecal occult blood test (FOBT) of the stool. You may have this test every year starting at age 24. Flexible sigmoidoscopy or colonoscopy. You may have a sigmoidoscopy every 5 years or a colonoscopy every 10 years starting at age 61. Prostate cancer screening. Recommendations will vary depending on your family history and other risks. Hepatitis C blood test. Hepatitis B blood test. Sexually transmitted disease (STD) testing. Diabetes screening. This is done by checking your blood sugar (glucose) after you have not eaten for a while (fasting). You may have this done every 1-3 years. Abdominal aortic aneurysm (AAA) screening. You may need this if you are a current or former smoker. Osteoporosis. You may be screened starting at age 84 if you are at high risk. Talk with your health care provider about your test results, treatment options, and if necessary, the need for more tests. Vaccines  Your health care provider may recommend certain vaccines, such as: Influenza vaccine. This is recommended every year. Tetanus, diphtheria, and acellular pertussis (Tdap, Td) vaccine. You may need a Td booster every 10 years. Zoster vaccine. You may need this after age 31. Pneumococcal 13-valent conjugate (PCV13) vaccine. One dose is recommended after age 22. Pneumococcal polysaccharide (PPSV23) vaccine. One dose is recommended after age 4. Talk to your health care provider about which screenings and vaccines you need and how often you need them. This information is not intended to replace advice  given to you by your health care provider. Make sure you discuss any  questions you have with your health care provider. Document Released: 10/14/2015 Document Revised: 06/06/2016 Document Reviewed: 07/19/2015 Elsevier Interactive Patient Education  2017 [employer].  Fall Prevention in the Home Falls can cause injuries. They can happen to people of all ages. There are many things you can do to make your home safe and to help prevent falls. What can I do on the outside of my home? Regularly fix the edges of walkways and driveways and fix any cracks. Remove anything that might make you trip as you walk through a door, such as a raised step or threshold. Trim any bushes or trees on the path to your home. Use bright outdoor lighting. Clear any walking paths of anything that might make someone trip, such as rocks or tools. Regularly check to see if handrails are loose or broken. Make sure that both sides of any steps have handrails. Any raised decks and porches should have guardrails on the edges. Have any leaves, snow, or ice cleared regularly. Use sand or salt on walking paths during winter. Clean up any spills in your garage right away. This includes oil or grease spills. What can I do in the bathroom? Use night lights. Install grab bars by the toilet and in the tub and shower. Do not use towel bars as grab bars. Use non-skid mats or decals in the tub or shower. If you need to sit down in the shower, use a plastic, non-slip stool. Keep the floor dry. Clean up any water that spills on the floor as soon as it happens. Remove soap buildup in the tub or shower regularly. Attach bath mats securely with double-sided non-slip rug tape. Do not have throw rugs and other things on the floor that can make you trip. What can I do in the bedroom? Use night lights. Make sure that you have a light by your bed that is easy to reach. Do not use any sheets or blankets that are too big for your bed. They should not hang down onto the floor. Have a firm chair that has side  arms. You can use this for support while you get dressed. Do not have throw rugs and other things on the floor that can make you trip. What can I do in the kitchen? Clean up any spills right away. Avoid walking on wet floors. Keep items that you use a lot in easy-to-reach places. If you need to reach something above you, use a strong step stool that has a grab bar. Keep electrical cords out of the way. Do not use floor polish or wax that makes floors slippery. If you must use wax, use non-skid floor wax. Do not have throw rugs and other things on the floor that can make you trip. What can I do with my stairs? Do not leave any items on the stairs. Make sure that there are handrails on both sides of the stairs and use them. Fix handrails that are broken or loose. Make sure that handrails are as long as the stairways. Check any carpeting to make sure that it is firmly attached to the stairs. Fix any carpet that is loose or worn. Avoid having throw rugs at the top or bottom of the stairs. If you do have throw rugs, attach them to the floor with carpet tape. Make sure that you have a light switch at the top of the stairs and the bottom of  the stairs. If you do not have them, ask someone to add them for you. What else can I do to help prevent falls? Wear shoes that: Do not have high heels. Have rubber bottoms. Are comfortable and fit you well. Are closed at the toe. Do not wear sandals. If you use a stepladder: Make sure that it is fully opened. Do not climb a closed stepladder. Make sure that both sides of the stepladder are locked into place. Ask someone to hold it for you, if possible. Clearly mark and make sure that you can see: Any grab bars or handrails. First and last steps. Where the edge of each step is. Use tools that help you move around (mobility aids) if they are needed. These include: Canes. Walkers. Scooters. Crutches. Turn on the lights when you go into a dark area.  Replace any light bulbs as soon as they burn out. Set up your furniture so you have a clear path. Avoid moving your furniture around. If any of your floors are uneven, fix them. If there are any pets around you, be aware of where they are. Review your medicines with your doctor. Some medicines can make you feel dizzy. This can increase your chance of falling. Ask your doctor what other things that you can do to help prevent falls. This information is not intended to replace advice given to you by your health care provider. Make sure you discuss any questions you have with your health care provider. Document Released: 07/14/2009 Document Revised: 02/23/2016 Document Reviewed: 10/22/2014 Elsevier Interactive Patient Education  2017 [employer].

## 2023-12-05 ENCOUNTER — Encounter: Payer: Self-pay | Admitting: Family Medicine

## 2023-12-08 DIAGNOSIS — E871 Hypo-osmolality and hyponatremia: Secondary | ICD-10-CM | POA: Diagnosis not present

## 2023-12-08 DIAGNOSIS — B965 Pseudomonas (aeruginosa) (mallei) (pseudomallei) as the cause of diseases classified elsewhere: Secondary | ICD-10-CM | POA: Diagnosis not present

## 2023-12-08 DIAGNOSIS — K219 Gastro-esophageal reflux disease without esophagitis: Secondary | ICD-10-CM | POA: Diagnosis not present

## 2023-12-08 DIAGNOSIS — E1122 Type 2 diabetes mellitus with diabetic chronic kidney disease: Secondary | ICD-10-CM | POA: Diagnosis not present

## 2023-12-08 DIAGNOSIS — F17210 Nicotine dependence, cigarettes, uncomplicated: Secondary | ICD-10-CM | POA: Diagnosis not present

## 2023-12-08 DIAGNOSIS — N390 Urinary tract infection, site not specified: Secondary | ICD-10-CM | POA: Diagnosis not present

## 2023-12-08 DIAGNOSIS — E876 Hypokalemia: Secondary | ICD-10-CM | POA: Diagnosis not present

## 2023-12-08 DIAGNOSIS — I129 Hypertensive chronic kidney disease with stage 1 through stage 4 chronic kidney disease, or unspecified chronic kidney disease: Secondary | ICD-10-CM | POA: Diagnosis not present

## 2023-12-08 DIAGNOSIS — R1310 Dysphagia, unspecified: Secondary | ICD-10-CM | POA: Diagnosis not present

## 2023-12-08 DIAGNOSIS — K5790 Diverticulosis of intestine, part unspecified, without perforation or abscess without bleeding: Secondary | ICD-10-CM | POA: Diagnosis not present

## 2023-12-08 DIAGNOSIS — N1831 Chronic kidney disease, stage 3a: Secondary | ICD-10-CM | POA: Diagnosis not present

## 2023-12-08 DIAGNOSIS — N411 Chronic prostatitis: Secondary | ICD-10-CM | POA: Diagnosis not present

## 2023-12-08 DIAGNOSIS — I2510 Atherosclerotic heart disease of native coronary artery without angina pectoris: Secondary | ICD-10-CM | POA: Diagnosis not present

## 2023-12-08 DIAGNOSIS — M5136 Other intervertebral disc degeneration, lumbar region with discogenic back pain only: Secondary | ICD-10-CM | POA: Diagnosis not present

## 2023-12-08 DIAGNOSIS — Z48815 Encounter for surgical aftercare following surgery on the digestive system: Secondary | ICD-10-CM | POA: Diagnosis not present

## 2023-12-08 DIAGNOSIS — E785 Hyperlipidemia, unspecified: Secondary | ICD-10-CM | POA: Diagnosis not present

## 2023-12-10 ENCOUNTER — Other Ambulatory Visit: Payer: Self-pay | Admitting: Family Medicine

## 2023-12-10 DIAGNOSIS — I2510 Atherosclerotic heart disease of native coronary artery without angina pectoris: Secondary | ICD-10-CM | POA: Diagnosis not present

## 2023-12-10 DIAGNOSIS — E871 Hypo-osmolality and hyponatremia: Secondary | ICD-10-CM | POA: Diagnosis not present

## 2023-12-10 DIAGNOSIS — E785 Hyperlipidemia, unspecified: Secondary | ICD-10-CM | POA: Diagnosis not present

## 2023-12-10 DIAGNOSIS — B965 Pseudomonas (aeruginosa) (mallei) (pseudomallei) as the cause of diseases classified elsewhere: Secondary | ICD-10-CM | POA: Diagnosis not present

## 2023-12-10 DIAGNOSIS — K219 Gastro-esophageal reflux disease without esophagitis: Secondary | ICD-10-CM | POA: Diagnosis not present

## 2023-12-10 DIAGNOSIS — N411 Chronic prostatitis: Secondary | ICD-10-CM | POA: Diagnosis not present

## 2023-12-10 DIAGNOSIS — Z48815 Encounter for surgical aftercare following surgery on the digestive system: Secondary | ICD-10-CM | POA: Diagnosis not present

## 2023-12-10 DIAGNOSIS — E876 Hypokalemia: Secondary | ICD-10-CM | POA: Diagnosis not present

## 2023-12-10 DIAGNOSIS — M5136 Other intervertebral disc degeneration, lumbar region with discogenic back pain only: Secondary | ICD-10-CM | POA: Diagnosis not present

## 2023-12-10 DIAGNOSIS — N390 Urinary tract infection, site not specified: Secondary | ICD-10-CM | POA: Diagnosis not present

## 2023-12-10 DIAGNOSIS — R1310 Dysphagia, unspecified: Secondary | ICD-10-CM | POA: Diagnosis not present

## 2023-12-10 DIAGNOSIS — K5790 Diverticulosis of intestine, part unspecified, without perforation or abscess without bleeding: Secondary | ICD-10-CM | POA: Diagnosis not present

## 2023-12-10 DIAGNOSIS — N1831 Chronic kidney disease, stage 3a: Secondary | ICD-10-CM | POA: Diagnosis not present

## 2023-12-10 DIAGNOSIS — E1122 Type 2 diabetes mellitus with diabetic chronic kidney disease: Secondary | ICD-10-CM | POA: Diagnosis not present

## 2023-12-10 DIAGNOSIS — I129 Hypertensive chronic kidney disease with stage 1 through stage 4 chronic kidney disease, or unspecified chronic kidney disease: Secondary | ICD-10-CM | POA: Diagnosis not present

## 2023-12-10 DIAGNOSIS — F17210 Nicotine dependence, cigarettes, uncomplicated: Secondary | ICD-10-CM | POA: Diagnosis not present

## 2023-12-13 ENCOUNTER — Other Ambulatory Visit: Payer: Self-pay | Admitting: Family Medicine

## 2023-12-16 ENCOUNTER — Other Ambulatory Visit: Payer: Self-pay | Admitting: *Deleted

## 2023-12-16 DIAGNOSIS — I2510 Atherosclerotic heart disease of native coronary artery without angina pectoris: Secondary | ICD-10-CM | POA: Diagnosis not present

## 2023-12-16 DIAGNOSIS — E1122 Type 2 diabetes mellitus with diabetic chronic kidney disease: Secondary | ICD-10-CM | POA: Diagnosis not present

## 2023-12-16 DIAGNOSIS — E876 Hypokalemia: Secondary | ICD-10-CM | POA: Diagnosis not present

## 2023-12-16 DIAGNOSIS — I129 Hypertensive chronic kidney disease with stage 1 through stage 4 chronic kidney disease, or unspecified chronic kidney disease: Secondary | ICD-10-CM | POA: Diagnosis not present

## 2023-12-16 DIAGNOSIS — M5136 Other intervertebral disc degeneration, lumbar region with discogenic back pain only: Secondary | ICD-10-CM | POA: Diagnosis not present

## 2023-12-16 DIAGNOSIS — N390 Urinary tract infection, site not specified: Secondary | ICD-10-CM | POA: Diagnosis not present

## 2023-12-16 DIAGNOSIS — N411 Chronic prostatitis: Secondary | ICD-10-CM | POA: Diagnosis not present

## 2023-12-16 DIAGNOSIS — Z48815 Encounter for surgical aftercare following surgery on the digestive system: Secondary | ICD-10-CM | POA: Diagnosis not present

## 2023-12-16 DIAGNOSIS — N1831 Chronic kidney disease, stage 3a: Secondary | ICD-10-CM | POA: Diagnosis not present

## 2023-12-16 DIAGNOSIS — R1310 Dysphagia, unspecified: Secondary | ICD-10-CM | POA: Diagnosis not present

## 2023-12-16 DIAGNOSIS — K219 Gastro-esophageal reflux disease without esophagitis: Secondary | ICD-10-CM | POA: Diagnosis not present

## 2023-12-16 DIAGNOSIS — F17210 Nicotine dependence, cigarettes, uncomplicated: Secondary | ICD-10-CM | POA: Diagnosis not present

## 2023-12-16 DIAGNOSIS — B965 Pseudomonas (aeruginosa) (mallei) (pseudomallei) as the cause of diseases classified elsewhere: Secondary | ICD-10-CM | POA: Diagnosis not present

## 2023-12-16 DIAGNOSIS — E785 Hyperlipidemia, unspecified: Secondary | ICD-10-CM | POA: Diagnosis not present

## 2023-12-16 DIAGNOSIS — E871 Hypo-osmolality and hyponatremia: Secondary | ICD-10-CM | POA: Diagnosis not present

## 2023-12-16 DIAGNOSIS — K5790 Diverticulosis of intestine, part unspecified, without perforation or abscess without bleeding: Secondary | ICD-10-CM | POA: Diagnosis not present

## 2023-12-17 ENCOUNTER — Telehealth: Payer: Self-pay | Admitting: Family Medicine

## 2023-12-17 DIAGNOSIS — E871 Hypo-osmolality and hyponatremia: Secondary | ICD-10-CM | POA: Diagnosis not present

## 2023-12-17 DIAGNOSIS — M5136 Other intervertebral disc degeneration, lumbar region with discogenic back pain only: Secondary | ICD-10-CM | POA: Diagnosis not present

## 2023-12-17 DIAGNOSIS — B965 Pseudomonas (aeruginosa) (mallei) (pseudomallei) as the cause of diseases classified elsewhere: Secondary | ICD-10-CM | POA: Diagnosis not present

## 2023-12-17 DIAGNOSIS — N411 Chronic prostatitis: Secondary | ICD-10-CM | POA: Diagnosis not present

## 2023-12-17 DIAGNOSIS — F17210 Nicotine dependence, cigarettes, uncomplicated: Secondary | ICD-10-CM | POA: Diagnosis not present

## 2023-12-17 DIAGNOSIS — Z48815 Encounter for surgical aftercare following surgery on the digestive system: Secondary | ICD-10-CM | POA: Diagnosis not present

## 2023-12-17 DIAGNOSIS — K5790 Diverticulosis of intestine, part unspecified, without perforation or abscess without bleeding: Secondary | ICD-10-CM | POA: Diagnosis not present

## 2023-12-17 DIAGNOSIS — Z7902 Long term (current) use of antithrombotics/antiplatelets: Secondary | ICD-10-CM | POA: Diagnosis not present

## 2023-12-17 DIAGNOSIS — E876 Hypokalemia: Secondary | ICD-10-CM | POA: Diagnosis not present

## 2023-12-17 DIAGNOSIS — I2510 Atherosclerotic heart disease of native coronary artery without angina pectoris: Secondary | ICD-10-CM | POA: Diagnosis not present

## 2023-12-17 DIAGNOSIS — Z556 Problems related to health literacy: Secondary | ICD-10-CM | POA: Diagnosis not present

## 2023-12-17 DIAGNOSIS — K219 Gastro-esophageal reflux disease without esophagitis: Secondary | ICD-10-CM | POA: Diagnosis not present

## 2023-12-17 DIAGNOSIS — R1310 Dysphagia, unspecified: Secondary | ICD-10-CM | POA: Diagnosis not present

## 2023-12-17 DIAGNOSIS — N390 Urinary tract infection, site not specified: Secondary | ICD-10-CM | POA: Diagnosis not present

## 2023-12-17 DIAGNOSIS — N1831 Chronic kidney disease, stage 3a: Secondary | ICD-10-CM | POA: Diagnosis not present

## 2023-12-17 DIAGNOSIS — E785 Hyperlipidemia, unspecified: Secondary | ICD-10-CM | POA: Diagnosis not present

## 2023-12-17 DIAGNOSIS — I129 Hypertensive chronic kidney disease with stage 1 through stage 4 chronic kidney disease, or unspecified chronic kidney disease: Secondary | ICD-10-CM | POA: Diagnosis not present

## 2023-12-17 DIAGNOSIS — Z8673 Personal history of transient ischemic attack (TIA), and cerebral infarction without residual deficits: Secondary | ICD-10-CM | POA: Diagnosis not present

## 2023-12-17 DIAGNOSIS — Z9049 Acquired absence of other specified parts of digestive tract: Secondary | ICD-10-CM | POA: Diagnosis not present

## 2023-12-17 DIAGNOSIS — E1122 Type 2 diabetes mellitus with diabetic chronic kidney disease: Secondary | ICD-10-CM | POA: Diagnosis not present

## 2023-12-17 MED ORDER — MYRBETRIQ 50 MG PO TB24: 50.0000 mg | ORAL_TABLET | Freq: Every day | ORAL | 0 refills | Status: AC

## 2023-12-17 NOTE — Telephone Encounter (Signed)
 Copied from CRM [id number]. Topic: Clinical - Medication Question >> Dec 17, 2023  1:40 PM Chantha C wrote: Reason for CRM: Patient's child [name] [phone] needs checking in on MYRBETRIQ 50 MG TB24 tablet. [facility] pharmacist advise to contact the office, because the office did not authorization medication refill. Please advise and call back.   [facility] [address] ([address]), [place] - [address] [place] ([place]) [place] [address] Phone:[phone]:[phone]

## 2023-12-17 NOTE — Telephone Encounter (Signed)
 Pt scheduled for 4/3 and 30 day supply has been sent to the pharmacy.

## 2023-12-25 ENCOUNTER — Telehealth: Payer: Self-pay

## 2023-12-25 DIAGNOSIS — K5790 Diverticulosis of intestine, part unspecified, without perforation or abscess without bleeding: Secondary | ICD-10-CM

## 2023-12-25 DIAGNOSIS — I2510 Atherosclerotic heart disease of native coronary artery without angina pectoris: Secondary | ICD-10-CM

## 2023-12-25 DIAGNOSIS — E785 Hyperlipidemia, unspecified: Secondary | ICD-10-CM

## 2023-12-25 DIAGNOSIS — E1122 Type 2 diabetes mellitus with diabetic chronic kidney disease: Secondary | ICD-10-CM | POA: Diagnosis not present

## 2023-12-25 DIAGNOSIS — K219 Gastro-esophageal reflux disease without esophagitis: Secondary | ICD-10-CM

## 2023-12-25 DIAGNOSIS — M5136 Other intervertebral disc degeneration, lumbar region with discogenic back pain only: Secondary | ICD-10-CM

## 2023-12-25 DIAGNOSIS — N1831 Chronic kidney disease, stage 3a: Secondary | ICD-10-CM | POA: Diagnosis not present

## 2023-12-25 DIAGNOSIS — E871 Hypo-osmolality and hyponatremia: Secondary | ICD-10-CM

## 2023-12-25 DIAGNOSIS — N390 Urinary tract infection, site not specified: Secondary | ICD-10-CM

## 2023-12-25 DIAGNOSIS — I129 Hypertensive chronic kidney disease with stage 1 through stage 4 chronic kidney disease, or unspecified chronic kidney disease: Secondary | ICD-10-CM | POA: Diagnosis not present

## 2023-12-25 DIAGNOSIS — E876 Hypokalemia: Secondary | ICD-10-CM

## 2023-12-25 DIAGNOSIS — Z48815 Encounter for surgical aftercare following surgery on the digestive system: Secondary | ICD-10-CM | POA: Diagnosis not present

## 2023-12-25 NOTE — Telephone Encounter (Signed)
 Home health orders received 12/25/23 for [name]  SN 2 wk1, 1wk3, 2 PRN visits for labs, changes in cardiopulmonary status, falls PT 1wk9 OT 1wk1  Home health initiation orders: Yes.  Home health re-certification orders: No. Patient last seen by ordering physician for this condition: 11/21/23. Must be less than 90 days for re-certification and less than 30 days prior for initiation. Visit must have been for the condition the orders are being placed.  Patient meets criteria for Physician to sign orders: Yes.        Current med list has been attached: Yes        Orders placed on physicians desk for signature: 12/25/23 (date) If patient does not meet criteria for orders to be signed: pt was called to schedule appt. Appt is scheduled for 01/02/24.   Sumayyah Custodio D Ciel Chervenak   Placed on PCP desk to review and sign, if appropriate.

## 2023-12-26 DIAGNOSIS — E876 Hypokalemia: Secondary | ICD-10-CM | POA: Diagnosis not present

## 2023-12-26 DIAGNOSIS — N390 Urinary tract infection, site not specified: Secondary | ICD-10-CM | POA: Diagnosis not present

## 2023-12-26 DIAGNOSIS — N1831 Chronic kidney disease, stage 3a: Secondary | ICD-10-CM | POA: Diagnosis not present

## 2023-12-26 DIAGNOSIS — M5136 Other intervertebral disc degeneration, lumbar region with discogenic back pain only: Secondary | ICD-10-CM | POA: Diagnosis not present

## 2023-12-26 DIAGNOSIS — K219 Gastro-esophageal reflux disease without esophagitis: Secondary | ICD-10-CM | POA: Diagnosis not present

## 2023-12-26 DIAGNOSIS — B965 Pseudomonas (aeruginosa) (mallei) (pseudomallei) as the cause of diseases classified elsewhere: Secondary | ICD-10-CM | POA: Diagnosis not present

## 2023-12-26 DIAGNOSIS — E785 Hyperlipidemia, unspecified: Secondary | ICD-10-CM | POA: Diagnosis not present

## 2023-12-26 DIAGNOSIS — E1122 Type 2 diabetes mellitus with diabetic chronic kidney disease: Secondary | ICD-10-CM | POA: Diagnosis not present

## 2023-12-26 DIAGNOSIS — Z48815 Encounter for surgical aftercare following surgery on the digestive system: Secondary | ICD-10-CM | POA: Diagnosis not present

## 2023-12-26 DIAGNOSIS — N411 Chronic prostatitis: Secondary | ICD-10-CM | POA: Diagnosis not present

## 2023-12-26 DIAGNOSIS — I129 Hypertensive chronic kidney disease with stage 1 through stage 4 chronic kidney disease, or unspecified chronic kidney disease: Secondary | ICD-10-CM | POA: Diagnosis not present

## 2023-12-26 DIAGNOSIS — K5790 Diverticulosis of intestine, part unspecified, without perforation or abscess without bleeding: Secondary | ICD-10-CM | POA: Diagnosis not present

## 2023-12-26 DIAGNOSIS — E871 Hypo-osmolality and hyponatremia: Secondary | ICD-10-CM | POA: Diagnosis not present

## 2023-12-26 DIAGNOSIS — I2510 Atherosclerotic heart disease of native coronary artery without angina pectoris: Secondary | ICD-10-CM | POA: Diagnosis not present

## 2023-12-26 DIAGNOSIS — F17210 Nicotine dependence, cigarettes, uncomplicated: Secondary | ICD-10-CM | POA: Diagnosis not present

## 2023-12-26 DIAGNOSIS — R1310 Dysphagia, unspecified: Secondary | ICD-10-CM | POA: Diagnosis not present

## 2023-12-27 ENCOUNTER — Encounter: Payer: Self-pay | Admitting: *Deleted

## 2023-12-27 ENCOUNTER — Other Ambulatory Visit: Payer: Self-pay | Admitting: *Deleted

## 2023-12-27 NOTE — Patient Instructions (Signed)
 Visit Information  Thank you for taking time to visit with me today. Please don't hesitate to contact me if I can be of assistance to you before our next scheduled telephone appointment.  Following are the goals we discussed today:   Goals Addressed             This Visit's Progress    RNCM Care Management Expected Outcomes: Monitor, Self-Manage and Reduce Symptoms of: DM, HTN, OAB & Debility       Current Barriers:  Knowledge Deficits related to plan of care for management of HTN, DMII, Overactive Bladder, and Debility  Chronic Disease Management support and education needs related to HTN, DMII, Overactive Bladder, and Debility  Lacks caregiver support.  RNCM Clinical Goal(s):  Patient will verbalize basic understanding of  HTN, DMII, Overactive Bladder, and Debility disease process and self health management plan as evidenced by verbal explanation, recognizing symptoms, lifestyle modifications take all medications exactly as prescribed and will call provider for medication related questions as evidenced by compliance with all medications attend all scheduled medical appointments: with primary care provider and specialist as evidenced by keeping all scheduled appointments demonstrate Improved and Ongoing adherence to prescribed treatment plan for HTN, DMII, Overactive Bladder, and Debility as evidenced by consistent medication compliance, symptom monitoring, continued lifestyle modifications continue to work with RN Care Manager to address care management and care coordination needs related to  HTN, DMII, Overactive Bladder, and Debility as evidenced by adherence to CM Team Scheduled appointments work with Child psychotherapist to address  related to the management of Level of care concerns related to the management of debility as evidenced by review of EMR and patient or Child psychotherapist report through collaboration with Medical illustrator, provider, and care team.  Maintain or improve functional ability  by safely performing ADLs independently or with minimal assistance Optimize nutrition by maintain weight with adequate protein intake  Interventions: Evaluation of current treatment plan related to  self management and patient's adherence to plan as established by provider   Diabetes Interventions:  (Status:  Condition stable.  Not addressed this visit.) Long Term Goal Assessed patient's understanding of A1c goal: <6.5% Provided education to patient about basic DM disease process. Does not check his blood sugar, reports being diet controlled Reviewed medications with patient and discussed importance of medication adherence. Reports compliance with all medications Discussed plans with patient for ongoing care management follow up and provided patient with direct contact information for care management team Provided patient with written educational materials related to hypo and hyperglycemia and importance of correct treatment. Denies any hypo/hyperglycemia Review of patient status, including review of consultants reports, relevant laboratory and other test results, and medications completed Lab Results  Component Value Date   HGBA1C 6.4 (A) 08/09/2023   HGBA1C 6.4 08/09/2023   HGBA1C 6.4 08/09/2023   HGBA1C 6.4 08/09/2023     Falls Interventions:  (Status:  Goal on track:  Yes.) Long Term Goal Provided written and verbal education re: potential causes of falls and Fall prevention strategies Reviewed medications and discussed potential side effects of medications such as dizziness and frequent urination. Daughter reports that when he takes his BP medication, that he has dizziness and that contributes to his unsteady gait Advised patient of importance of notifying provider of falls Assessed for falls since last encounter. Step Daughter, [name] reports a few falls since his discharge from SNF Assessed patients knowledge of fall risk prevention secondary to previously provided  education   Debility  (  Status:  Goal on track:  Yes.)  Long Term Goal Gradual activity progression by encouraging gentle exercises such as chair exercise and range of motion and gradually increase intensity as tolerated. Per [name], patient was in a SNF for 6 weeks and she states that he is now ambulatory with a walker. She reports that he is dependent on her for all care. Pace activities by scheduling rest periods between activities to conserve energy Utilize assistive devices such as canes, walks or wheelchairs as needed to support mobility.  Work closely with the healthcare team to ensure all needs are being met.   Hypertension Interventions:  (Status:  Goal on track:  Yes.) Long Term Goal Last practice recorded BP readings:  BP Readings from Last 3 Encounters:  11/21/23 (!) 147/80  10/11/23 134/68  08/16/23 124/83   Most recent eGFR/CrCl:  Lab Results  Component Value Date   EGFR 87 10/23/2023    No components found for: "CRCL"  Evaluation of current treatment plan related to hypertension self management and patient's adherence to plan as established by provider Provided education to patient re: stroke prevention, s/s of heart attack and stroke. Education and support provided Reviewed medications with patient and discussed importance of compliance. Reports compliance with all medications Counseled on the importance of exercise goals with target of 150 minutes per week. Works with PT but no structured activities due to unsteady gait Discussed plans with patient for ongoing care management follow up and provided patient with direct contact information for care management team Advised patient, providing education and rationale, to monitor blood pressure daily and record, calling PCP for findings outside established parameters. Reports BP yesterday 132/76 Today: 140/68. RNCM provided education for goal for SBP<140 DBP<90 Reviewed scheduled/upcoming provider appointments including: 01-02-24  with PCP Discussed complications of poorly controlled blood pressure such as heart disease, stroke, circulatory complications, vision complications, kidney impairment, sexual dysfunction   Overactive Bladder  (Status:  Condition stable.  Not addressed this visit.)  Long Term Goal Monitor fluid intake throughout the day Identify and avoid bladder irritants such as caffeine, alcohol, carbonated beverages Bladder training: establish a timed voiding schedule, if needed Compliance with prescribed medication, Myrbetriq for overactive bladder Monitor for signs/symptoms of UTI.   Patient Goals/Self-Care Activities: Take all medications as prescribed Attend all scheduled provider appointments Call pharmacy for medication refills 3-7 days in advance of running out of medications Attend church or other social activities Call provider office for new concerns or questions  Work with the social worker to address care coordination needs and will continue to work with the clinical team to address health care and disease management related needs check blood pressure daily write blood pressure results in a log or diary  Follow Up Plan:  Telephone follow up appointment with care management team member scheduled for:  01-28-2024 at 9:45 am           Our next appointment is by telephone on 01-28-2024 at 9:45 am  Please call the care guide team at [phone] if you need to cancel or reschedule your appointment.   If you are experiencing a Mental Health or Behavioral Health Crisis or need someone to talk to, please call the Suicide and Crisis Lifeline: 988 call the [place] National Suicide Prevention Lifeline: 248-807-1648 or TTY: 702-478-1534 TTY (501)452-7490) to talk to a trained counselor call 1-800-273-TALK (toll free, 24 hour hotline)   The patient verbalized understanding of instructions, educational materials, and care plan provided today and DECLINED offer to receive  copy of patient instructions,  educational materials, and care plan.   Telephone follow up appointment with care management team member scheduled for:01-28-2024 at 9:45 am  Danise Edge, BSN RN [facility], [facility] RN Care Manager Direct Dial: [phone]  Fax: [phone]

## 2023-12-27 NOTE — Patient Outreach (Signed)
 Care Management   Visit Note  12/27/2023 Name: Antonio Serrano MRN: [id number] DOB: [birth year]  Subjective: Antonio Serrano is a 78 y.o. year old male who is a primary care patient of [name], [name], MD. The Care Management team was consulted for assistance.      Engaged with patient spoke with patient by telephone.    Goals Addressed             This Visit's Progress    RNCM Care Management Expected Outcomes: Monitor, Self-Manage and Reduce Symptoms of: DM, HTN, OAB & Debility       Current Barriers:  Knowledge Deficits related to plan of care for management of HTN, DMII, Overactive Bladder, and Debility  Chronic Disease Management support and education needs related to HTN, DMII, Overactive Bladder, and Debility  Lacks caregiver support.  RNCM Clinical Goal(s):  Patient will verbalize basic understanding of  HTN, DMII, Overactive Bladder, and Debility disease process and self health management plan as evidenced by verbal explanation, recognizing symptoms, lifestyle modifications take all medications exactly as prescribed and will call provider for medication related questions as evidenced by compliance with all medications attend all scheduled medical appointments: with primary care provider and specialist as evidenced by keeping all scheduled appointments demonstrate Improved and Ongoing adherence to prescribed treatment plan for HTN, DMII, Overactive Bladder, and Debility as evidenced by consistent medication compliance, symptom monitoring, continued lifestyle modifications continue to work with RN Care Manager to address care management and care coordination needs related to  HTN, DMII, Overactive Bladder, and Debility as evidenced by adherence to CM Team Scheduled appointments work with Child psychotherapist to address  related to the management of Level of care concerns related to the management of debility as evidenced by review of EMR and patient or Child psychotherapist report through  collaboration with Medical illustrator, provider, and care team.  Maintain or improve functional ability by safely performing ADLs independently or with minimal assistance Optimize nutrition by maintain weight with adequate protein intake  Interventions: Evaluation of current treatment plan related to  self management and patient's adherence to plan as established by provider   Diabetes Interventions:  (Status:  Condition stable.  Not addressed this visit.) Long Term Goal Assessed patient's understanding of A1c goal: <6.5% Provided education to patient about basic DM disease process. Does not check his blood sugar, reports being diet controlled Reviewed medications with patient and discussed importance of medication adherence. Reports compliance with all medications Discussed plans with patient for ongoing care management follow up and provided patient with direct contact information for care management team Provided patient with written educational materials related to hypo and hyperglycemia and importance of correct treatment. Denies any hypo/hyperglycemia Review of patient status, including review of consultants reports, relevant laboratory and other test results, and medications completed Lab Results  Component Value Date   HGBA1C 6.4 (A) 08/09/2023   HGBA1C 6.4 08/09/2023   HGBA1C 6.4 08/09/2023   HGBA1C 6.4 08/09/2023     Falls Interventions:  (Status:  Goal on track:  Yes.) Long Term Goal Provided written and verbal education re: potential causes of falls and Fall prevention strategies Reviewed medications and discussed potential side effects of medications such as dizziness and frequent urination. Daughter reports that when he takes his BP medication, that he has dizziness and that contributes to his unsteady gait Advised patient of importance of notifying provider of falls Assessed for falls since last encounter. Step Daughter, [name] reports a few falls since his discharge from  SNF Assessed patients knowledge of fall risk prevention secondary to previously provided education   Debility  (Status:  Goal on track:  Yes.)  Long Term Goal Gradual activity progression by encouraging gentle exercises such as chair exercise and range of motion and gradually increase intensity as tolerated. Per [name], patient was in a SNF for 6 weeks and she states that he is now ambulatory with a walker. She reports that he is dependent on her for all care. Pace activities by scheduling rest periods between activities to conserve energy Utilize assistive devices such as canes, walks or wheelchairs as needed to support mobility.  Work closely with the healthcare team to ensure all needs are being met.   Hypertension Interventions:  (Status:  Goal on track:  Yes.) Long Term Goal Last practice recorded BP readings:  BP Readings from Last 3 Encounters:  11/21/23 (!) 147/80  10/11/23 134/68  08/16/23 124/83   Most recent eGFR/CrCl:  Lab Results  Component Value Date   EGFR 87 10/23/2023    No components found for: "CRCL"  Evaluation of current treatment plan related to hypertension self management and patient's adherence to plan as established by provider Provided education to patient re: stroke prevention, s/s of heart attack and stroke. Education and support provided Reviewed medications with patient and discussed importance of compliance. Reports compliance with all medications Counseled on the importance of exercise goals with target of 150 minutes per week. Works with PT but no structured activities due to unsteady gait Discussed plans with patient for ongoing care management follow up and provided patient with direct contact information for care management team Advised patient, providing education and rationale, to monitor blood pressure daily and record, calling PCP for findings outside established parameters. Reports BP yesterday 132/76 Today: 140/68. RNCM provided education for goal  for SBP<140 DBP<90 Reviewed scheduled/upcoming provider appointments including: 01-02-24 with PCP Discussed complications of poorly controlled blood pressure such as heart disease, stroke, circulatory complications, vision complications, kidney impairment, sexual dysfunction   Overactive Bladder  (Status:  Condition stable.  Not addressed this visit.)  Long Term Goal Monitor fluid intake throughout the day Identify and avoid bladder irritants such as caffeine, alcohol, carbonated beverages Bladder training: establish a timed voiding schedule, if needed Compliance with prescribed medication, Myrbetriq for overactive bladder Monitor for signs/symptoms of UTI.   Patient Goals/Self-Care Activities: Take all medications as prescribed Attend all scheduled provider appointments Call pharmacy for medication refills 3-7 days in advance of running out of medications Attend church or other social activities Call provider office for new concerns or questions  Work with the social worker to address care coordination needs and will continue to work with the clinical team to address health care and disease management related needs check blood pressure daily write blood pressure results in a log or diary  Follow Up Plan:  Telephone follow up appointment with care management team member scheduled for:  01-28-2024 at 9:45 am              Consent to Services:  Patient was given information about care management services, agreed to services, and gave verbal consent to participate.   Plan: Telephone follow up appointment with care management team member scheduled for:01-28-2024 at 9:45 am  Danise Edge, BSN RN [facility], [facility] Health RN Care Manager Direct Dial: [phone]  Fax: [phone]

## 2023-12-29 DIAGNOSIS — N1831 Chronic kidney disease, stage 3a: Secondary | ICD-10-CM | POA: Diagnosis not present

## 2023-12-29 DIAGNOSIS — N411 Chronic prostatitis: Secondary | ICD-10-CM | POA: Diagnosis not present

## 2023-12-29 DIAGNOSIS — M5136 Other intervertebral disc degeneration, lumbar region with discogenic back pain only: Secondary | ICD-10-CM | POA: Diagnosis not present

## 2023-12-29 DIAGNOSIS — E876 Hypokalemia: Secondary | ICD-10-CM | POA: Diagnosis not present

## 2023-12-29 DIAGNOSIS — N390 Urinary tract infection, site not specified: Secondary | ICD-10-CM | POA: Diagnosis not present

## 2023-12-29 DIAGNOSIS — E785 Hyperlipidemia, unspecified: Secondary | ICD-10-CM | POA: Diagnosis not present

## 2023-12-29 DIAGNOSIS — I129 Hypertensive chronic kidney disease with stage 1 through stage 4 chronic kidney disease, or unspecified chronic kidney disease: Secondary | ICD-10-CM | POA: Diagnosis not present

## 2023-12-29 DIAGNOSIS — E1122 Type 2 diabetes mellitus with diabetic chronic kidney disease: Secondary | ICD-10-CM | POA: Diagnosis not present

## 2023-12-29 DIAGNOSIS — E871 Hypo-osmolality and hyponatremia: Secondary | ICD-10-CM | POA: Diagnosis not present

## 2023-12-29 DIAGNOSIS — K219 Gastro-esophageal reflux disease without esophagitis: Secondary | ICD-10-CM | POA: Diagnosis not present

## 2023-12-29 DIAGNOSIS — Z48815 Encounter for surgical aftercare following surgery on the digestive system: Secondary | ICD-10-CM | POA: Diagnosis not present

## 2023-12-29 DIAGNOSIS — K5790 Diverticulosis of intestine, part unspecified, without perforation or abscess without bleeding: Secondary | ICD-10-CM | POA: Diagnosis not present

## 2023-12-29 DIAGNOSIS — F17210 Nicotine dependence, cigarettes, uncomplicated: Secondary | ICD-10-CM | POA: Diagnosis not present

## 2023-12-29 DIAGNOSIS — B965 Pseudomonas (aeruginosa) (mallei) (pseudomallei) as the cause of diseases classified elsewhere: Secondary | ICD-10-CM | POA: Diagnosis not present

## 2023-12-29 DIAGNOSIS — I2510 Atherosclerotic heart disease of native coronary artery without angina pectoris: Secondary | ICD-10-CM | POA: Diagnosis not present

## 2023-12-29 DIAGNOSIS — R1310 Dysphagia, unspecified: Secondary | ICD-10-CM | POA: Diagnosis not present

## 2023-12-30 ENCOUNTER — Telehealth: Payer: Self-pay | Admitting: *Deleted

## 2023-12-30 ENCOUNTER — Encounter: Payer: Self-pay | Admitting: *Deleted

## 2023-12-30 DIAGNOSIS — R262 Difficulty in walking, not elsewhere classified: Secondary | ICD-10-CM

## 2023-12-30 NOTE — Patient Outreach (Signed)
  Care Management   Visit Note  12/30/2023 Name: Antonio Serrano MRN: [id number] DOB: 1946-09-06  Subjective: Antonio Serrano is a 78 y.o. year old male who is a primary care patient of Antonio Serrano, Antonio Morn, MD. The Care Management team was consulted for assistance.      Engaged with patient spoke with the family member (POA, Antonio Serrano, [place]).   Assessment: Received incoming from Antonio Serrano, patient step daughter/caregiver reporting that patient fell on Saturday with injury noted to the right side of his forehead and patient refused to go to the ED. She reports that she is unable to continue to meet the patient needs at home and is seeking assistance. Patient is refusing long term  SNF placement but spouse/step daughter believe that he is not competent to make those decision. RNCM advised that this would have to be discussed with PCP completing an evaluation for competency. RNCM advised that patient/spouse speak with PCP regarding level of care concerns and in the mean time start the process for long term care Medicaid. SW referral placed by [facility].  Interventions: Social Work referral for assistance with placement, long-term care Medicaid.  Recommendations:     Consent to Services:  Patient was given information about care management services, agreed to services, and gave verbal consent to participate.   Plan: The care management team will reach out to the patient again over the next 30 days.   Danise Edge, BSN RN [facility], [facility] RN Care Manager Direct Dial: [phone]  Fax: [phone]

## 2023-12-30 NOTE — Patient Outreach (Signed)
  Care Management   Follow Up Note   12/30/2023 Name: Antonio Serrano MRN: [id number] DOB: Aug 20, 1946   Referred by: Jeoffrey Massed, MD Reason for referral : No chief complaint on file.   Placed [id number] referral.  Follow Up Plan: The care management team will reach out to the patient again over the next 30 days.  Danise Edge, BSN RN [facility], [facility] RN Care Manager Direct Dial: [phone]  Fax: [phone]

## 2024-01-02 ENCOUNTER — Telehealth: Admitting: Family Medicine

## 2024-01-02 ENCOUNTER — Telehealth: Payer: Self-pay

## 2024-01-02 NOTE — Progress Notes (Signed)
 Complex Care Management Note  Care Guide Note 01/02/2024 Name: Antonio Serrano MRN: [id number] DOB: [birth year]  Antonio Serrano is a 78 y.o. year old male who sees [name], [name], MD for primary care. I reached out to [name] by phone today to offer complex care management services.  Mr. Begay was given information about Complex Care Management services today including:   The Complex Care Management services include support from the care team which includes your Nurse Care Manager, Clinical Social Worker, or Pharmacist.  The Complex Care Management team is here to help remove barriers to the health concerns and goals most important to you. Complex Care Management services are voluntary, and the patient may decline or stop services at any time by request to their care team member.   Complex Care Management Consent Status: Patient did not agree to participate in complex care management services at this time.   Encounter Outcome:  Patient Refused  Penne Lash , RMA     [facility]  [facility], [facility] Guide  Direct Dial: [phone]  Website: [email or web address]

## 2024-01-02 NOTE — Progress Notes (Deleted)
 Virtual Visit via Video Note  I connected with Antonio Serrano  on 01/02/24 at  1:00 PM EDT by a video enabled telemedicine application and verified that I am speaking with the correct person using two identifiers.  Location patient: [place] Location provider:work or home office Persons participating in the virtual visit: patient, provider  I discussed the limitations and requested verbal permission for telemedicine visit. The patient expressed understanding and agreed to proceed.  CC: 78 year old male being seen today for 6-week follow-up debilitation, hypertension, and chronic renal insufficiency. A/P as of last visit: "#1 debilitated patient. He is making good progress.  He ambulates with a walker.   2.  Small bowel obstruction--> he is status post lysis of adhesions and appendectomy. Eating and drinking normally now, gaining weight.   #3 hypertension, well-controlled taking amlodipine 2.5 mg every other day.   4.  Chronic renal insufficiency stage III Serum creatinine stable at 1.17 on the day of discharge to SNF. Will see if we can get any lab results from [place].   5.  Diabetes with nephropathy. Well-controlled--> hemoglobin A1c 6.4% in November 2024. No meds. Will try to get any recent labs from [facility]."  ***  ROS: See pertinent positives and negatives per HPI.  Past Medical History:  Diagnosis Date   Atherosclerosis of coronary artery    On chest CT->calcif in LAD and RCA   Cerebrovascular disease    03/2023 MRI brain showed chronic microvasc isch changes and small remote infarcts   Chronic low back pain    MR 11/2021 showed L4-5 foraminal stenosis--> plan per Ortho was to do L4-5 injection   Chronic prostatitis    Very mild elevation of PSA (>4), referred to [facility] where PSA repeat was 1.0 on 06/16/13.  Annual PSA repeat is all that is needed now per urologist.  Most recent 07/2015 was 0.45.   Chronic renal insufficiency, stage III (moderate) (HCC)    Baseline  GFR as of 2019= 50s.     Closed right hip fracture (HCC) 03/2021   THA   Diabetes mellitus with complication (HCC) Dx'd fall 2011   Gastritis and gastroduodenitis 06/16/2023   HTN (hypertension)    Renal/aortic doppler u/s normal 06/2010   Hypercalcemia    Hyperlipidemia 2016   Statin intolerant--myalgias.  Pt refuses any further trial of statin as of 2018.   Nonspecific abnormal electrocardiogram (ECG) (EKG) Fall 2011   TWI in inferior leads: myocardial perfusion scan neg and echo normal 07/2010   PAF (paroxysmal atrial fibrillation) (HCC)    2024   Pseudomonas aeruginosa colonization    12/2021 urine clx   SBO (small bowel obstruction) (HCC) 10/2023   Closed-loop obstruction due to adhesions from chronic appendicitis--> resolved status post appendectomy   TIA (transient ischemic attack)    12/2021- CT angio head/neck no high grade stenosis.  MR showed old lacunar infarcts corona radiata, basal ganglia, thalamus.  Echo normal.   Tobacco dependence    UTI (lower urinary tract infection)    Feb 2012 (klebsiella--dx at [facility]); 03/2013 e coli.     Past Surgical History:  Procedure Laterality Date   BIOPSY  06/16/2023   Procedure: BIOPSY;  Surgeon: Meridee Score Netty Starring., MD;  Location: [facility];  Service: Gastroenterology;;   CARDIOVASCULAR STRESS TEST  07/01/2010   Myocardial perfusion scan neg/low risk.   CATARACT EXTRACTION  2007, 2008   Bilateral ([facility] on [address].   COLONOSCOPY  12/20/04;12/2014   2016 no polyps: recall 5 yrs due  to FH of colon ca   ESOPHAGOGASTRODUODENOSCOPY  11/29/2004   esoph stricture/dilation   ESOPHAGOGASTRODUODENOSCOPY N/A 06/16/2023   Procedure: ESOPHAGOGASTRODUODENOSCOPY (EGD);  Surgeon: Lemar Lofty., MD;  Location: [facility];  Service: Gastroenterology;  Laterality: N/A;   LAPAROSCOPY N/A 09/29/2023   for SBO, appendix inflamed-->chronic appendicitis with adhesions-->appendectomy.Procedure: LAPAROSCOPY  DIAGNOSTIC; BILATERAL TAP BLOCK; LAPAROSCOPIC APPENDECTOMY; LYSIS OF ADHESION;  Surgeon: Karie Soda, MD;  Location: [facility];  Service: General;  Laterality: N/A;   SAVORY DILATION N/A 06/16/2023   Procedure: Gaspar Bidding DILATION;  Surgeon: Lemar Lofty., MD;  Location: [facility];  Service: Gastroenterology;  Laterality: N/A;   TOTAL HIP ARTHROPLASTY Right 04/23/2021   Procedure: TOTAL HIP ARTHROPLASTY ANTERIOR APPROACH;  Surgeon: Cammy Copa, MD;  Location: [facility];  Service: Orthopedics;  Laterality: Right;  Hana, C-arm, Depuy   TRANSTHORACIC ECHOCARDIOGRAM  07/01/2010   2011 EF =>55%; LA mildly dilated; trace MR/TR.  12/2021 EF 60-65%, DD indeterm, valves nl.     Current Outpatient Medications:    amLODipine (NORVASC) 2.5 MG tablet, Take 1 tablet (2.5 mg total) by mouth daily., Disp: 90 tablet, Rfl: 1   clopidogrel (PLAVIX) 75 MG tablet, Take 1 tablet by mouth once daily, Disp: 30 tablet, Rfl: 0   ipratropium (ATROVENT) 0.03 % nasal spray, USE 2 SPRAY(S) IN EACH NOSTRIL EVERY 12 HOURS (Patient taking differently: Place 2 sprays into both nostrils every 12 (twelve) hours as needed for rhinitis.), Disp: 30 mL, Rfl: 11   MYRBETRIQ 50 MG TB24 tablet, Take 1 tablet (50 mg total) by mouth daily., Disp: 30 tablet, Rfl: 0   pantoprazole (PROTONIX) 40 MG tablet, Take 1 tablet (40 mg total) by mouth 2 (two) times daily before a meal. (Patient taking differently: Take 40 mg by mouth daily before breakfast.), Disp: , Rfl:    polyethylene glycol (MIRALAX / GLYCOLAX) 17 g packet, Take 17 g by mouth 2 (two) times daily., Disp: , Rfl:    sodium chloride 1 g tablet, Take 1 tablet (1 g total) by mouth 2 (two) times daily with a meal., Disp: 60 tablet, Rfl: 1   sucralfate (CARAFATE) 1 GM/10ML suspension, Take 10 mLs (1 g total) by mouth 4 (four) times daily -  with meals and at bedtime. (Patient not taking: Reported on 09/25/2023), Disp: 420 mL, Rfl: 0   traZODone (DESYREL) 50 MG tablet, TAKE 1  TABLET BY MOUTH EVERY DAY AT BEDTIME AS NEEDED FOR SLEEP, Disp: 30 tablet, Rfl: 0  EXAM:  VITALS per patient if applicable:     11/21/2023    4:15 PM 10/11/2023    4:06 PM 10/11/2023    6:12 AM  Vitals with BMI  Weight 129 lbs    Systolic 147 134 161  Diastolic 80 68 65  Pulse  73 64     GENERAL: alert, oriented, appears well and in no acute distress  HEENT: atraumatic, conjunttiva clear, no obvious abnormalities on inspection of external nose and ears  NECK: normal movements of the head and neck  LUNGS: on inspection no signs of respiratory distress, breathing rate appears normal, no obvious gross SOB, gasping or wheezing  CV: no obvious cyanosis  MS: moves all visible extremities without noticeable abnormality  PSYCH/NEURO: pleasant and cooperative, no obvious depression or anxiety, speech and thought processing grossly intact  LABS: none today   Chemistry      Component Value Date/Time   NA 134 (A) 10/23/2023 0000   K 3.7 10/23/2023 0000   CL 103 10/23/2023  0000   CO2 21 10/23/2023 0000   BUN 9 10/23/2023 0000   CREATININE 0.9 10/23/2023 0000   CREATININE 1.17 10/11/2023 0459   CREATININE 1.27 08/09/2023 1435   GLU 85 10/23/2023 0000      Component Value Date/Time   CALCIUM 8.4 (A) 10/23/2023 0000   ALKPHOS 105 09/25/2023 1711   AST 17 09/25/2023 1711   ALT 10 09/25/2023 1711   BILITOT 0.9 09/25/2023 1711   BILITOT 0.3 02/16/2019 0953      ASSESSMENT AND PLAN:  Discussed the following assessment and plan:  No diagnosis found.  We were unable to get labs from [facility] after last visit--> he needs to come in for hemoglobin A1c and bmet.***  I discussed the assessment and treatment plan with the patient. The patient was provided an opportunity to ask questions and all were answered. The patient agreed with the plan and demonstrated an understanding of the instructions.   F/u: ***  Signed:  Santiago Bumpers, MD           01/02/2024

## 2024-01-03 ENCOUNTER — Encounter: Payer: Self-pay | Admitting: Family Medicine

## 2024-01-03 ENCOUNTER — Telehealth: Admitting: Family Medicine

## 2024-01-03 VITALS — BP 132/76

## 2024-01-03 DIAGNOSIS — R5381 Other malaise: Secondary | ICD-10-CM | POA: Diagnosis not present

## 2024-01-03 DIAGNOSIS — Z7409 Other reduced mobility: Secondary | ICD-10-CM

## 2024-01-03 DIAGNOSIS — N1831 Chronic kidney disease, stage 3a: Secondary | ICD-10-CM | POA: Diagnosis not present

## 2024-01-03 DIAGNOSIS — I10 Essential (primary) hypertension: Secondary | ICD-10-CM

## 2024-01-03 DIAGNOSIS — E1121 Type 2 diabetes mellitus with diabetic nephropathy: Secondary | ICD-10-CM

## 2024-01-03 NOTE — Progress Notes (Signed)
 Virtual Visit via Video Note  I connected with Antonio Serrano and his daughter-in-law [name]  on 01/03/24 at 11:20 AM EDT by a video enabled telemedicine application and verified that I am speaking with the correct person using two identifiers.  Location patient: [place] Location provider:work or home office Persons participating in the virtual visit: patient, provider  I discussed the limitations and requested verbal permission for telemedicine visit. The patient expressed understanding and agreed to proceed.  CC: 78 year old male being seen today accompanied by his daughter-in-law [name] for 6-week follow-up chronic debilitation, hypertension, diabetes, and chronic renal insufficiency stage III. A/P as of last visit: "1 debilitated patient. He is making good progress.  He ambulates with a walker.   2.  Small bowel obstruction--> he is status post lysis of adhesions and appendectomy. Eating and drinking normally now, gaining weight.   #3 hypertension, well-controlled taking amlodipine 2.5 mg every other day.   4.  Chronic renal insufficiency stage III Serum creatinine stable at 1.17 on the day of discharge to SNF. Will see if we can get any lab results from [facility].   5.  Diabetes with nephropathy. Well-controlled--> hemoglobin A1c 6.4% in November 2024. No meds. Will try to get any recent labs from [facility]."  INTERIM HX: Records from [facility] did not have any lab results.  Generally says he is doing well. Blood pressures have been consistently normal off of antihypertensive. When he does take his amlodipine he gets dizzy and weak in the legs. He reports a good appetite.  No lower extremity swelling.  He gets up from bed and/or chair without assistance.  He ambulates only with his walker.  He has a one-story home.  He has 6 steps to get down out of his house so he always has to have assistance with this. He had 1 fall last week where he got up out of his chair too  quick without assistance and stumbled.  He hit his head and has a scratch on the scalp but says he did not lose consciousness and did not have a headache afterward or dizziness afterward.  Urinating well.  Bowel movements normal.  ROS: See pertinent positives and negatives per HPI.  Past Medical History:  Diagnosis Date   Atherosclerosis of coronary artery    On chest CT->calcif in LAD and RCA   Cerebrovascular disease    03/2023 MRI brain showed chronic microvasc isch changes and small remote infarcts   Chronic low back pain    MR 11/2021 showed L4-5 foraminal stenosis--> plan per Ortho was to do L4-5 injection   Chronic prostatitis    Very mild elevation of PSA (>4), referred to [facility] where PSA repeat was 1.0 on 06/16/13.  Annual PSA repeat is all that is needed now per urologist.  Most recent 07/2015 was 0.45.   Chronic renal insufficiency, stage III (moderate) (HCC)    Baseline GFR as of 2019= 50s.     Closed right hip fracture (HCC) 03/2021   THA   Diabetes mellitus with complication (HCC) Dx'd fall 2011   Gastritis and gastroduodenitis 06/16/2023   HTN (hypertension)    Renal/aortic doppler u/s normal 06/2010   Hypercalcemia    Hyperlipidemia 2016   Statin intolerant--myalgias.  Pt refuses any further trial of statin as of 2018.   Nonspecific abnormal electrocardiogram (ECG) (EKG) Fall 2011   TWI in inferior leads: myocardial perfusion scan neg and echo normal 07/2010   PAF (paroxysmal atrial fibrillation) (HCC)    2024  Pseudomonas aeruginosa colonization    12/2021 urine clx   SBO (small bowel obstruction) (HCC) 10/29/2023   Closed-loop obstruction due to adhesions from chronic appendicitis--> resolved status post appendectomy   TIA (transient ischemic attack)    12/2021- CT angio head/neck no high grade stenosis.  MR showed old lacunar infarcts corona radiata, basal ganglia, thalamus.  Echo normal.   Tobacco dependence    UTI (lower urinary tract infection)    Feb 2012  (klebsiella--dx at [facility]); 03/2013 e coli.     Past Surgical History:  Procedure Laterality Date   BIOPSY  06/16/2023   Procedure: BIOPSY;  Surgeon: Meridee Score Netty Starring., MD;  Location: [facility];  Service: Gastroenterology;;   CARDIOVASCULAR STRESS TEST  07/01/2010   Myocardial perfusion scan neg/low risk.   CATARACT EXTRACTION  2007, 2008   Bilateral ([facility] on [address].   COLONOSCOPY  12/20/04;12/2014   2016 no polyps: recall 5 yrs due to FH of colon ca   ESOPHAGOGASTRODUODENOSCOPY  11/29/2004   esoph stricture/dilation   ESOPHAGOGASTRODUODENOSCOPY N/A 06/16/2023   Procedure: ESOPHAGOGASTRODUODENOSCOPY (EGD);  Surgeon: Lemar Lofty., MD;  Location: [facility];  Service: Gastroenterology;  Laterality: N/A;   LAPAROSCOPY N/A 09/29/2023   for SBO, appendix inflamed-->chronic appendicitis with adhesions-->appendectomy.Procedure: LAPAROSCOPY DIAGNOSTIC; BILATERAL TAP BLOCK; LAPAROSCOPIC APPENDECTOMY; LYSIS OF ADHESION;  Surgeon: Karie Soda, MD;  Location: [facility];  Service: General;  Laterality: N/A;   SAVORY DILATION N/A 06/16/2023   Procedure: Gaspar Bidding DILATION;  Surgeon: Lemar Lofty., MD;  Location: [facility];  Service: Gastroenterology;  Laterality: N/A;   TOTAL HIP ARTHROPLASTY Right 04/23/2021   Procedure: TOTAL HIP ARTHROPLASTY ANTERIOR APPROACH;  Surgeon: Cammy Copa, MD;  Location: [facility];  Service: Orthopedics;  Laterality: Right;  Hana, C-arm, Depuy   TRANSTHORACIC ECHOCARDIOGRAM  07/01/2010   2011 EF =>55%; LA mildly dilated; trace MR/TR.  12/2021 EF 60-65%, DD indeterm, valves nl.     Current Outpatient Medications:    amLODipine (NORVASC) 2.5 MG tablet, Take 1 tablet (2.5 mg total) by mouth daily., Disp: 90 tablet, Rfl: 1   clopidogrel (PLAVIX) 75 MG tablet, Take 1 tablet by mouth once daily, Disp: 30 tablet, Rfl: 0   ipratropium (ATROVENT) 0.03 % nasal spray, USE 2 SPRAY(S) IN EACH NOSTRIL EVERY 12 HOURS  (Patient taking differently: Place 2 sprays into both nostrils every 12 (twelve) hours as needed for rhinitis.), Disp: 30 mL, Rfl: 11   MYRBETRIQ 50 MG TB24 tablet, Take 1 tablet (50 mg total) by mouth daily., Disp: 30 tablet, Rfl: 0   pantoprazole (PROTONIX) 40 MG tablet, Take 1 tablet (40 mg total) by mouth 2 (two) times daily before a meal. (Patient taking differently: Take 40 mg by mouth daily before breakfast.), Disp: , Rfl:    polyethylene glycol (MIRALAX / GLYCOLAX) 17 g packet, Take 17 g by mouth 2 (two) times daily., Disp: , Rfl:    sodium chloride 1 g tablet, Take 1 tablet (1 g total) by mouth 2 (two) times daily with a meal., Disp: 60 tablet, Rfl: 1   traZODone (DESYREL) 50 MG tablet, TAKE 1 TABLET BY MOUTH EVERY DAY AT BEDTIME AS NEEDED FOR SLEEP, Disp: 30 tablet, Rfl: 0   sucralfate (CARAFATE) 1 GM/10ML suspension, Take 10 mLs (1 g total) by mouth 4 (four) times daily -  with meals and at bedtime. (Patient not taking: Reported on 01/03/2024), Disp: 420 mL, Rfl: 0  EXAM:  VITALS per patient if applicable:     01/03/2024  11:12 AM 11/21/2023    4:15 PM 10/11/2023    4:06 PM  Vitals with BMI  Weight  129 lbs   Systolic 132 147 865  Diastolic 76 80 68  Pulse   73     GENERAL: alert, oriented, appears well and in no acute distress  HEENT: atraumatic, conjunttiva clear, no obvious abnormalities on inspection of external nose and ears  NECK: normal movements of the head and neck  LUNGS: on inspection no signs of respiratory distress, breathing rate appears normal, no obvious gross SOB, gasping or wheezing  CV: no obvious cyanosis  MS: moves all visible extremities without noticeable abnormality  PSYCH/NEURO: pleasant and cooperative, no obvious depression or anxiety, speech and thought processing grossly intact  LABS: none today    Chemistry      Component Value Date/Time   NA 134 (A) 10/23/2023 0000   K 3.7 10/23/2023 0000   CL 103 10/23/2023 0000   CO2 21 10/23/2023  0000   BUN 9 10/23/2023 0000   CREATININE 0.9 10/23/2023 0000   CREATININE 1.17 10/11/2023 0459   CREATININE 1.27 08/09/2023 1435   GLU 85 10/23/2023 0000      Component Value Date/Time   CALCIUM 8.4 (A) 10/23/2023 0000   ALKPHOS 105 09/25/2023 1711   AST 17 09/25/2023 1711   ALT 10 09/25/2023 1711   BILITOT 0.9 09/25/2023 1711   BILITOT 0.3 02/16/2019 0953     Lab Results  Component Value Date   HGBA1C 6.4 (A) 08/09/2023   HGBA1C 6.4 08/09/2023   HGBA1C 6.4 08/09/2023   HGBA1C 6.4 08/09/2023   ASSESSMENT AND PLAN:  Discussed the following assessment and plan:  #1 hypertension, history of. Blood pressure has been normal off of blood pressure medication.  In fact, blood pressure medication has led to dizziness and lower extremity generalized weakness. No medications at this time.  2.  Diabetes, diet controlled. He says his most recent sugar at home was 105 (random). We will get hemoglobin A1c at next follow-up in 6-8 weeks.  #3 chronic renal insufficiency stage III. Avoid NSAIDs. Hydrate well. Will repeat basic metabolic panel when I see him again in 6 to 8 weeks.  #4 impaired mobility, debilitated patient. He continues to gradually improve since being in the hospital and rehab earlier this year. Will see if home health can offer any aid--> although he can transfer from bed/chair to his walker he still has significantly slow and unsteady gait.  His wife is leaving the home frequently lately to get chemo treatments.  His daughter is staying with him nearly all the time but there are times when he has to be left alone. He is currently getting in-home PT once a week.  I discussed the assessment and treatment plan with the patient. The patient was provided an opportunity to ask questions and all were answered. The patient agreed with the plan and demonstrated an understanding of the instructions.   F/u: 6-8 wks  Signed:  Santiago Bumpers, MD           01/03/2024

## 2024-01-09 DIAGNOSIS — F17210 Nicotine dependence, cigarettes, uncomplicated: Secondary | ICD-10-CM | POA: Diagnosis not present

## 2024-01-09 DIAGNOSIS — M5136 Other intervertebral disc degeneration, lumbar region with discogenic back pain only: Secondary | ICD-10-CM | POA: Diagnosis not present

## 2024-01-09 DIAGNOSIS — I2510 Atherosclerotic heart disease of native coronary artery without angina pectoris: Secondary | ICD-10-CM | POA: Diagnosis not present

## 2024-01-09 DIAGNOSIS — N411 Chronic prostatitis: Secondary | ICD-10-CM | POA: Diagnosis not present

## 2024-01-09 DIAGNOSIS — Z48815 Encounter for surgical aftercare following surgery on the digestive system: Secondary | ICD-10-CM | POA: Diagnosis not present

## 2024-01-09 DIAGNOSIS — B965 Pseudomonas (aeruginosa) (mallei) (pseudomallei) as the cause of diseases classified elsewhere: Secondary | ICD-10-CM | POA: Diagnosis not present

## 2024-01-09 DIAGNOSIS — E785 Hyperlipidemia, unspecified: Secondary | ICD-10-CM | POA: Diagnosis not present

## 2024-01-09 DIAGNOSIS — E876 Hypokalemia: Secondary | ICD-10-CM | POA: Diagnosis not present

## 2024-01-09 DIAGNOSIS — N1831 Chronic kidney disease, stage 3a: Secondary | ICD-10-CM | POA: Diagnosis not present

## 2024-01-09 DIAGNOSIS — E1122 Type 2 diabetes mellitus with diabetic chronic kidney disease: Secondary | ICD-10-CM | POA: Diagnosis not present

## 2024-01-09 DIAGNOSIS — N390 Urinary tract infection, site not specified: Secondary | ICD-10-CM | POA: Diagnosis not present

## 2024-01-09 DIAGNOSIS — I129 Hypertensive chronic kidney disease with stage 1 through stage 4 chronic kidney disease, or unspecified chronic kidney disease: Secondary | ICD-10-CM | POA: Diagnosis not present

## 2024-01-09 DIAGNOSIS — R1310 Dysphagia, unspecified: Secondary | ICD-10-CM | POA: Diagnosis not present

## 2024-01-09 DIAGNOSIS — K5790 Diverticulosis of intestine, part unspecified, without perforation or abscess without bleeding: Secondary | ICD-10-CM | POA: Diagnosis not present

## 2024-01-09 DIAGNOSIS — E871 Hypo-osmolality and hyponatremia: Secondary | ICD-10-CM | POA: Diagnosis not present

## 2024-01-09 DIAGNOSIS — K219 Gastro-esophageal reflux disease without esophagitis: Secondary | ICD-10-CM | POA: Diagnosis not present

## 2024-01-10 NOTE — Addendum Note (Signed)
 Addended by: Emi Holes D on: 01/10/2024 11:39 AM   Modules accepted: Orders

## 2024-01-13 ENCOUNTER — Telehealth: Payer: Self-pay

## 2024-01-13 DIAGNOSIS — N1831 Chronic kidney disease, stage 3a: Secondary | ICD-10-CM | POA: Diagnosis not present

## 2024-01-13 DIAGNOSIS — E876 Hypokalemia: Secondary | ICD-10-CM | POA: Diagnosis not present

## 2024-01-13 DIAGNOSIS — B965 Pseudomonas (aeruginosa) (mallei) (pseudomallei) as the cause of diseases classified elsewhere: Secondary | ICD-10-CM | POA: Diagnosis not present

## 2024-01-13 DIAGNOSIS — R1310 Dysphagia, unspecified: Secondary | ICD-10-CM | POA: Diagnosis not present

## 2024-01-13 DIAGNOSIS — E871 Hypo-osmolality and hyponatremia: Secondary | ICD-10-CM | POA: Diagnosis not present

## 2024-01-13 DIAGNOSIS — I129 Hypertensive chronic kidney disease with stage 1 through stage 4 chronic kidney disease, or unspecified chronic kidney disease: Secondary | ICD-10-CM | POA: Diagnosis not present

## 2024-01-13 DIAGNOSIS — I2510 Atherosclerotic heart disease of native coronary artery without angina pectoris: Secondary | ICD-10-CM | POA: Diagnosis not present

## 2024-01-13 DIAGNOSIS — K219 Gastro-esophageal reflux disease without esophagitis: Secondary | ICD-10-CM | POA: Diagnosis not present

## 2024-01-13 DIAGNOSIS — E785 Hyperlipidemia, unspecified: Secondary | ICD-10-CM | POA: Diagnosis not present

## 2024-01-13 DIAGNOSIS — Z48815 Encounter for surgical aftercare following surgery on the digestive system: Secondary | ICD-10-CM | POA: Diagnosis not present

## 2024-01-13 DIAGNOSIS — F17210 Nicotine dependence, cigarettes, uncomplicated: Secondary | ICD-10-CM | POA: Diagnosis not present

## 2024-01-13 DIAGNOSIS — K5790 Diverticulosis of intestine, part unspecified, without perforation or abscess without bleeding: Secondary | ICD-10-CM | POA: Diagnosis not present

## 2024-01-13 DIAGNOSIS — E1122 Type 2 diabetes mellitus with diabetic chronic kidney disease: Secondary | ICD-10-CM | POA: Diagnosis not present

## 2024-01-13 DIAGNOSIS — M5136 Other intervertebral disc degeneration, lumbar region with discogenic back pain only: Secondary | ICD-10-CM | POA: Diagnosis not present

## 2024-01-13 DIAGNOSIS — N390 Urinary tract infection, site not specified: Secondary | ICD-10-CM | POA: Diagnosis not present

## 2024-01-13 DIAGNOSIS — N411 Chronic prostatitis: Secondary | ICD-10-CM | POA: Diagnosis not present

## 2024-01-13 NOTE — Telephone Encounter (Signed)
 Copied from CRM [id number]. Topic: Clinical - Home Health Verbal Orders >> Jan 13, 2024 10:27 AM Orien Bird wrote: Caller/Agency: Moreen Apt from [facility] Callback Number: [phone] Service Requested: Physical Therapy Frequency: 1 time a week  for 9 weeks  Any new concerns about the patient? No

## 2024-01-13 NOTE — Telephone Encounter (Signed)
Yes, okay.

## 2024-01-13 NOTE — Telephone Encounter (Signed)
 Approved VO given to [employer]

## 2024-01-16 DIAGNOSIS — K219 Gastro-esophageal reflux disease without esophagitis: Secondary | ICD-10-CM | POA: Diagnosis not present

## 2024-01-16 DIAGNOSIS — Z556 Problems related to health literacy: Secondary | ICD-10-CM | POA: Diagnosis not present

## 2024-01-16 DIAGNOSIS — E785 Hyperlipidemia, unspecified: Secondary | ICD-10-CM | POA: Diagnosis not present

## 2024-01-16 DIAGNOSIS — F17210 Nicotine dependence, cigarettes, uncomplicated: Secondary | ICD-10-CM | POA: Diagnosis not present

## 2024-01-16 DIAGNOSIS — K5790 Diverticulosis of intestine, part unspecified, without perforation or abscess without bleeding: Secondary | ICD-10-CM | POA: Diagnosis not present

## 2024-01-16 DIAGNOSIS — Z87440 Personal history of urinary (tract) infections: Secondary | ICD-10-CM | POA: Diagnosis not present

## 2024-01-16 DIAGNOSIS — M5136 Other intervertebral disc degeneration, lumbar region with discogenic back pain only: Secondary | ICD-10-CM | POA: Diagnosis not present

## 2024-01-16 DIAGNOSIS — R1310 Dysphagia, unspecified: Secondary | ICD-10-CM | POA: Diagnosis not present

## 2024-01-16 DIAGNOSIS — I129 Hypertensive chronic kidney disease with stage 1 through stage 4 chronic kidney disease, or unspecified chronic kidney disease: Secondary | ICD-10-CM | POA: Diagnosis not present

## 2024-01-16 DIAGNOSIS — Z8673 Personal history of transient ischemic attack (TIA), and cerebral infarction without residual deficits: Secondary | ICD-10-CM | POA: Diagnosis not present

## 2024-01-16 DIAGNOSIS — Z7902 Long term (current) use of antithrombotics/antiplatelets: Secondary | ICD-10-CM | POA: Diagnosis not present

## 2024-01-16 DIAGNOSIS — I2510 Atherosclerotic heart disease of native coronary artery without angina pectoris: Secondary | ICD-10-CM | POA: Diagnosis not present

## 2024-01-16 DIAGNOSIS — N1831 Chronic kidney disease, stage 3a: Secondary | ICD-10-CM | POA: Diagnosis not present

## 2024-01-16 DIAGNOSIS — E1122 Type 2 diabetes mellitus with diabetic chronic kidney disease: Secondary | ICD-10-CM | POA: Diagnosis not present

## 2024-01-16 DIAGNOSIS — Z9049 Acquired absence of other specified parts of digestive tract: Secondary | ICD-10-CM | POA: Diagnosis not present

## 2024-01-18 ENCOUNTER — Other Ambulatory Visit: Payer: Self-pay | Admitting: Family Medicine

## 2024-01-20 ENCOUNTER — Encounter: Payer: Self-pay | Admitting: Diagnostic Neuroimaging

## 2024-01-20 ENCOUNTER — Ambulatory Visit: Payer: Medicare Other | Admitting: Diagnostic Neuroimaging

## 2024-01-21 ENCOUNTER — Telehealth: Payer: Self-pay

## 2024-01-21 DIAGNOSIS — Z87440 Personal history of urinary (tract) infections: Secondary | ICD-10-CM | POA: Diagnosis not present

## 2024-01-21 DIAGNOSIS — E785 Hyperlipidemia, unspecified: Secondary | ICD-10-CM | POA: Diagnosis not present

## 2024-01-21 DIAGNOSIS — K5790 Diverticulosis of intestine, part unspecified, without perforation or abscess without bleeding: Secondary | ICD-10-CM | POA: Diagnosis not present

## 2024-01-21 DIAGNOSIS — Z9049 Acquired absence of other specified parts of digestive tract: Secondary | ICD-10-CM | POA: Diagnosis not present

## 2024-01-21 DIAGNOSIS — I129 Hypertensive chronic kidney disease with stage 1 through stage 4 chronic kidney disease, or unspecified chronic kidney disease: Secondary | ICD-10-CM | POA: Diagnosis not present

## 2024-01-21 DIAGNOSIS — Z556 Problems related to health literacy: Secondary | ICD-10-CM | POA: Diagnosis not present

## 2024-01-21 DIAGNOSIS — R1310 Dysphagia, unspecified: Secondary | ICD-10-CM | POA: Diagnosis not present

## 2024-01-21 DIAGNOSIS — K219 Gastro-esophageal reflux disease without esophagitis: Secondary | ICD-10-CM | POA: Diagnosis not present

## 2024-01-21 DIAGNOSIS — F17210 Nicotine dependence, cigarettes, uncomplicated: Secondary | ICD-10-CM | POA: Diagnosis not present

## 2024-01-21 DIAGNOSIS — M5136 Other intervertebral disc degeneration, lumbar region with discogenic back pain only: Secondary | ICD-10-CM | POA: Diagnosis not present

## 2024-01-21 DIAGNOSIS — I2510 Atherosclerotic heart disease of native coronary artery without angina pectoris: Secondary | ICD-10-CM | POA: Diagnosis not present

## 2024-01-21 DIAGNOSIS — Z8673 Personal history of transient ischemic attack (TIA), and cerebral infarction without residual deficits: Secondary | ICD-10-CM | POA: Diagnosis not present

## 2024-01-21 DIAGNOSIS — E1122 Type 2 diabetes mellitus with diabetic chronic kidney disease: Secondary | ICD-10-CM | POA: Diagnosis not present

## 2024-01-21 DIAGNOSIS — N1831 Chronic kidney disease, stage 3a: Secondary | ICD-10-CM | POA: Diagnosis not present

## 2024-01-21 DIAGNOSIS — Z7902 Long term (current) use of antithrombotics/antiplatelets: Secondary | ICD-10-CM | POA: Diagnosis not present

## 2024-01-21 NOTE — Telephone Encounter (Signed)
 Home health orders received 01/20/24 for [facility] health initiation orders: No.  Home health re-certification orders: Yes. Patient last seen by ordering physician for this condition: 11/21/23. Must be less than 90 days for re-certification and less than 30 days prior for initiation. Visit must have been for the condition the orders are being placed.  Patient meets criteria for Physician to sign orders: Yes.        Current med list has been attached: No        Orders placed on physicians desk for signature: 01/21/24 (date) If patient does not meet criteria for orders to be signed: pt was called to schedule appt. Appt is scheduled for n/a.   Roddie Cisco

## 2024-01-22 DIAGNOSIS — M5136 Other intervertebral disc degeneration, lumbar region with discogenic back pain only: Secondary | ICD-10-CM | POA: Diagnosis not present

## 2024-01-22 DIAGNOSIS — N1831 Chronic kidney disease, stage 3a: Secondary | ICD-10-CM | POA: Diagnosis not present

## 2024-01-22 DIAGNOSIS — Z7902 Long term (current) use of antithrombotics/antiplatelets: Secondary | ICD-10-CM | POA: Diagnosis not present

## 2024-01-22 DIAGNOSIS — Z556 Problems related to health literacy: Secondary | ICD-10-CM | POA: Diagnosis not present

## 2024-01-22 DIAGNOSIS — K5790 Diverticulosis of intestine, part unspecified, without perforation or abscess without bleeding: Secondary | ICD-10-CM | POA: Diagnosis not present

## 2024-01-22 DIAGNOSIS — I129 Hypertensive chronic kidney disease with stage 1 through stage 4 chronic kidney disease, or unspecified chronic kidney disease: Secondary | ICD-10-CM | POA: Diagnosis not present

## 2024-01-22 DIAGNOSIS — E1122 Type 2 diabetes mellitus with diabetic chronic kidney disease: Secondary | ICD-10-CM | POA: Diagnosis not present

## 2024-01-22 DIAGNOSIS — K219 Gastro-esophageal reflux disease without esophagitis: Secondary | ICD-10-CM | POA: Diagnosis not present

## 2024-01-22 DIAGNOSIS — Z87440 Personal history of urinary (tract) infections: Secondary | ICD-10-CM | POA: Diagnosis not present

## 2024-01-22 DIAGNOSIS — I2510 Atherosclerotic heart disease of native coronary artery without angina pectoris: Secondary | ICD-10-CM | POA: Diagnosis not present

## 2024-01-22 DIAGNOSIS — F17210 Nicotine dependence, cigarettes, uncomplicated: Secondary | ICD-10-CM | POA: Diagnosis not present

## 2024-01-22 DIAGNOSIS — Z9049 Acquired absence of other specified parts of digestive tract: Secondary | ICD-10-CM | POA: Diagnosis not present

## 2024-01-22 DIAGNOSIS — R1310 Dysphagia, unspecified: Secondary | ICD-10-CM | POA: Diagnosis not present

## 2024-01-22 DIAGNOSIS — Z8673 Personal history of transient ischemic attack (TIA), and cerebral infarction without residual deficits: Secondary | ICD-10-CM | POA: Diagnosis not present

## 2024-01-22 DIAGNOSIS — E785 Hyperlipidemia, unspecified: Secondary | ICD-10-CM | POA: Diagnosis not present

## 2024-01-23 ENCOUNTER — Telehealth

## 2024-01-23 ENCOUNTER — Encounter: Payer: Self-pay | Admitting: Family Medicine

## 2024-01-23 NOTE — Telephone Encounter (Signed)
 I filled the form out and placed it in the box.  Will you please list his current medications from his chart on the paperwork?  Thanks.

## 2024-01-23 NOTE — Telephone Encounter (Signed)
 Copied from CRM [id number]. Topic: General - Other >> Jan 23, 2024 10:30 AM Alyse July wrote: Reason for CRM: Patient called requesting an FL2 form be completed for patient to qualify for long term medicaid per  [employer] to be accepted into a facility. Contact number [phone]

## 2024-01-23 NOTE — Telephone Encounter (Signed)
 Spoke with pt to confirm facility, [facility]. Pt's daughter in law contacted for additional information, confirmed with her this will be the facility he is going to unless they do not have space available for SNF.   She was made aware if patient gets accepted into another facility or they have a specific FL2 form they would like completed, it will need to be faxed to our office. She will let us  know if anything changes. Please contact [name] for pick up once form is completed ([phone])   Placed on PCP desk to review and sign, if appropriate.

## 2024-01-23 NOTE — Telephone Encounter (Signed)
 Form completed and original placed at the front for pick up. LVM for [name] to pick up form. Copy was made for our record,

## 2024-01-28 ENCOUNTER — Encounter: Payer: Self-pay | Admitting: *Deleted

## 2024-01-28 ENCOUNTER — Other Ambulatory Visit: Payer: Self-pay | Admitting: *Deleted

## 2024-01-29 ENCOUNTER — Telehealth: Payer: Self-pay

## 2024-01-29 DIAGNOSIS — I2510 Atherosclerotic heart disease of native coronary artery without angina pectoris: Secondary | ICD-10-CM | POA: Diagnosis not present

## 2024-01-29 DIAGNOSIS — E1122 Type 2 diabetes mellitus with diabetic chronic kidney disease: Secondary | ICD-10-CM | POA: Diagnosis not present

## 2024-01-29 DIAGNOSIS — M5136 Other intervertebral disc degeneration, lumbar region with discogenic back pain only: Secondary | ICD-10-CM | POA: Diagnosis not present

## 2024-01-29 DIAGNOSIS — Z8673 Personal history of transient ischemic attack (TIA), and cerebral infarction without residual deficits: Secondary | ICD-10-CM | POA: Diagnosis not present

## 2024-01-29 DIAGNOSIS — E785 Hyperlipidemia, unspecified: Secondary | ICD-10-CM | POA: Diagnosis not present

## 2024-01-29 DIAGNOSIS — N1831 Chronic kidney disease, stage 3a: Secondary | ICD-10-CM | POA: Diagnosis not present

## 2024-01-29 DIAGNOSIS — K219 Gastro-esophageal reflux disease without esophagitis: Secondary | ICD-10-CM | POA: Diagnosis not present

## 2024-01-29 DIAGNOSIS — F17210 Nicotine dependence, cigarettes, uncomplicated: Secondary | ICD-10-CM | POA: Diagnosis not present

## 2024-01-29 DIAGNOSIS — Z7902 Long term (current) use of antithrombotics/antiplatelets: Secondary | ICD-10-CM | POA: Diagnosis not present

## 2024-01-29 DIAGNOSIS — Z556 Problems related to health literacy: Secondary | ICD-10-CM | POA: Diagnosis not present

## 2024-01-29 DIAGNOSIS — K5790 Diverticulosis of intestine, part unspecified, without perforation or abscess without bleeding: Secondary | ICD-10-CM | POA: Diagnosis not present

## 2024-01-29 DIAGNOSIS — Z9049 Acquired absence of other specified parts of digestive tract: Secondary | ICD-10-CM | POA: Diagnosis not present

## 2024-01-29 DIAGNOSIS — I129 Hypertensive chronic kidney disease with stage 1 through stage 4 chronic kidney disease, or unspecified chronic kidney disease: Secondary | ICD-10-CM | POA: Diagnosis not present

## 2024-01-29 DIAGNOSIS — R1310 Dysphagia, unspecified: Secondary | ICD-10-CM | POA: Diagnosis not present

## 2024-01-29 DIAGNOSIS — Z87440 Personal history of urinary (tract) infections: Secondary | ICD-10-CM | POA: Diagnosis not present

## 2024-01-29 NOTE — Telephone Encounter (Signed)
 Home health [id number])  received 01/29/24 for [facility] health initiation orders: Yes.  Home health re-certification orders: No. Patient last seen by ordering physician for this condition: 01/03/24. Must be less than 90 days for re-certification and less than 30 days prior for initiation. Visit must have been for the condition the orders are being placed.  Patient meets criteria for Physician to sign orders: Yes.        Current med list has been attached: Yes        Orders placed on physicians desk for signature: 01/29/24 (date) If patient does not meet criteria for orders to be signed: pt was called to schedule appt. Appt is scheduled for N/A.    Placed on PCP desk to review and sign, if appropriate.  Hazle Ogburn D Demani Weyrauch

## 2024-02-04 DIAGNOSIS — Z7902 Long term (current) use of antithrombotics/antiplatelets: Secondary | ICD-10-CM | POA: Diagnosis not present

## 2024-02-04 DIAGNOSIS — I129 Hypertensive chronic kidney disease with stage 1 through stage 4 chronic kidney disease, or unspecified chronic kidney disease: Secondary | ICD-10-CM | POA: Diagnosis not present

## 2024-02-04 DIAGNOSIS — E1122 Type 2 diabetes mellitus with diabetic chronic kidney disease: Secondary | ICD-10-CM | POA: Diagnosis not present

## 2024-02-04 DIAGNOSIS — Z9049 Acquired absence of other specified parts of digestive tract: Secondary | ICD-10-CM | POA: Diagnosis not present

## 2024-02-04 DIAGNOSIS — I2510 Atherosclerotic heart disease of native coronary artery without angina pectoris: Secondary | ICD-10-CM | POA: Diagnosis not present

## 2024-02-04 DIAGNOSIS — K219 Gastro-esophageal reflux disease without esophagitis: Secondary | ICD-10-CM | POA: Diagnosis not present

## 2024-02-04 DIAGNOSIS — F17210 Nicotine dependence, cigarettes, uncomplicated: Secondary | ICD-10-CM | POA: Diagnosis not present

## 2024-02-04 DIAGNOSIS — E785 Hyperlipidemia, unspecified: Secondary | ICD-10-CM | POA: Diagnosis not present

## 2024-02-04 DIAGNOSIS — M5136 Other intervertebral disc degeneration, lumbar region with discogenic back pain only: Secondary | ICD-10-CM | POA: Diagnosis not present

## 2024-02-04 DIAGNOSIS — Z8673 Personal history of transient ischemic attack (TIA), and cerebral infarction without residual deficits: Secondary | ICD-10-CM | POA: Diagnosis not present

## 2024-02-04 DIAGNOSIS — N1831 Chronic kidney disease, stage 3a: Secondary | ICD-10-CM | POA: Diagnosis not present

## 2024-02-04 DIAGNOSIS — Z556 Problems related to health literacy: Secondary | ICD-10-CM | POA: Diagnosis not present

## 2024-02-04 DIAGNOSIS — Z87440 Personal history of urinary (tract) infections: Secondary | ICD-10-CM | POA: Diagnosis not present

## 2024-02-04 DIAGNOSIS — K5790 Diverticulosis of intestine, part unspecified, without perforation or abscess without bleeding: Secondary | ICD-10-CM | POA: Diagnosis not present

## 2024-02-04 DIAGNOSIS — R1310 Dysphagia, unspecified: Secondary | ICD-10-CM | POA: Diagnosis not present

## 2024-02-06 DIAGNOSIS — F17210 Nicotine dependence, cigarettes, uncomplicated: Secondary | ICD-10-CM | POA: Diagnosis not present

## 2024-02-06 DIAGNOSIS — K5790 Diverticulosis of intestine, part unspecified, without perforation or abscess without bleeding: Secondary | ICD-10-CM | POA: Diagnosis not present

## 2024-02-06 DIAGNOSIS — Z7902 Long term (current) use of antithrombotics/antiplatelets: Secondary | ICD-10-CM | POA: Diagnosis not present

## 2024-02-06 DIAGNOSIS — Z8673 Personal history of transient ischemic attack (TIA), and cerebral infarction without residual deficits: Secondary | ICD-10-CM | POA: Diagnosis not present

## 2024-02-06 DIAGNOSIS — M5136 Other intervertebral disc degeneration, lumbar region with discogenic back pain only: Secondary | ICD-10-CM | POA: Diagnosis not present

## 2024-02-06 DIAGNOSIS — Z9049 Acquired absence of other specified parts of digestive tract: Secondary | ICD-10-CM | POA: Diagnosis not present

## 2024-02-06 DIAGNOSIS — E785 Hyperlipidemia, unspecified: Secondary | ICD-10-CM | POA: Diagnosis not present

## 2024-02-06 DIAGNOSIS — I129 Hypertensive chronic kidney disease with stage 1 through stage 4 chronic kidney disease, or unspecified chronic kidney disease: Secondary | ICD-10-CM | POA: Diagnosis not present

## 2024-02-06 DIAGNOSIS — Z556 Problems related to health literacy: Secondary | ICD-10-CM | POA: Diagnosis not present

## 2024-02-06 DIAGNOSIS — I2510 Atherosclerotic heart disease of native coronary artery without angina pectoris: Secondary | ICD-10-CM | POA: Diagnosis not present

## 2024-02-06 DIAGNOSIS — N1831 Chronic kidney disease, stage 3a: Secondary | ICD-10-CM | POA: Diagnosis not present

## 2024-02-06 DIAGNOSIS — R1310 Dysphagia, unspecified: Secondary | ICD-10-CM | POA: Diagnosis not present

## 2024-02-06 DIAGNOSIS — K219 Gastro-esophageal reflux disease without esophagitis: Secondary | ICD-10-CM | POA: Diagnosis not present

## 2024-02-06 DIAGNOSIS — Z87440 Personal history of urinary (tract) infections: Secondary | ICD-10-CM | POA: Diagnosis not present

## 2024-02-06 DIAGNOSIS — E1122 Type 2 diabetes mellitus with diabetic chronic kidney disease: Secondary | ICD-10-CM | POA: Diagnosis not present

## 2024-02-10 ENCOUNTER — Telehealth: Payer: Self-pay

## 2024-02-10 DIAGNOSIS — Z87440 Personal history of urinary (tract) infections: Secondary | ICD-10-CM | POA: Diagnosis not present

## 2024-02-10 DIAGNOSIS — Z8673 Personal history of transient ischemic attack (TIA), and cerebral infarction without residual deficits: Secondary | ICD-10-CM | POA: Diagnosis not present

## 2024-02-10 DIAGNOSIS — I2510 Atherosclerotic heart disease of native coronary artery without angina pectoris: Secondary | ICD-10-CM | POA: Diagnosis not present

## 2024-02-10 DIAGNOSIS — K5790 Diverticulosis of intestine, part unspecified, without perforation or abscess without bleeding: Secondary | ICD-10-CM | POA: Diagnosis not present

## 2024-02-10 DIAGNOSIS — F17210 Nicotine dependence, cigarettes, uncomplicated: Secondary | ICD-10-CM | POA: Diagnosis not present

## 2024-02-10 DIAGNOSIS — E785 Hyperlipidemia, unspecified: Secondary | ICD-10-CM | POA: Diagnosis not present

## 2024-02-10 DIAGNOSIS — Z556 Problems related to health literacy: Secondary | ICD-10-CM | POA: Diagnosis not present

## 2024-02-10 DIAGNOSIS — Z7902 Long term (current) use of antithrombotics/antiplatelets: Secondary | ICD-10-CM | POA: Diagnosis not present

## 2024-02-10 DIAGNOSIS — M5136 Other intervertebral disc degeneration, lumbar region with discogenic back pain only: Secondary | ICD-10-CM | POA: Diagnosis not present

## 2024-02-10 DIAGNOSIS — E1122 Type 2 diabetes mellitus with diabetic chronic kidney disease: Secondary | ICD-10-CM | POA: Diagnosis not present

## 2024-02-10 DIAGNOSIS — R1310 Dysphagia, unspecified: Secondary | ICD-10-CM | POA: Diagnosis not present

## 2024-02-10 DIAGNOSIS — Z9049 Acquired absence of other specified parts of digestive tract: Secondary | ICD-10-CM | POA: Diagnosis not present

## 2024-02-10 DIAGNOSIS — K219 Gastro-esophageal reflux disease without esophagitis: Secondary | ICD-10-CM | POA: Diagnosis not present

## 2024-02-10 DIAGNOSIS — N1831 Chronic kidney disease, stage 3a: Secondary | ICD-10-CM | POA: Diagnosis not present

## 2024-02-10 DIAGNOSIS — I129 Hypertensive chronic kidney disease with stage 1 through stage 4 chronic kidney disease, or unspecified chronic kidney disease: Secondary | ICD-10-CM | POA: Diagnosis not present

## 2024-02-10 NOTE — Telephone Encounter (Signed)
 Spoke with pt's daughter, [name] regarding concerns; informed her pt declined complex care management services, phone number provided. She was made aware we do not handle calling nursing homes for placement but a clinical social worker thru the care management services may be able to assist.

## 2024-02-10 NOTE — Telephone Encounter (Signed)
 Copied from CRM [id number]. Topic: Clinical - Medical Advice >> Feb 10, 2024  1:32 PM [name] wrote: Reason for CRM: Patients daughter [name] called in wanting to now if she could get the number to the clinics case manager. She is inquiring of services for the patient and would like a call back at [phone].  CRM # [id number] Owner: [name], RN Status: Resolved Open  Priority: Routine Created on: 02/10/2024 04:01 PM By: [name]   Primary Information  Source  Antonio Serrano (Patient)   Subject  Antonio Serrano (Patient)   Topic  General - Call Back - No Documentation    Communication  Reason for CRM: patient daughter called and stated that the nursing home that the patient is trying to go to is no longer accepting patients [facility] in [place]. Patients is needing a call back from the nurse or case worker in receiving help to get in another nursing home [phone]     Patient Information  Patient Name Gender DOB SSN  Antonio Serrano, Antonio Serrano Male 1946/05/21 [id number]   Resolution  Resolved On Reason By Resolved First Attempt?  02/10/2024 04:12 PM Transferred to Clinic [name], RN No   Contacts  Contact Date/Time Type Contact Phone/Fax  02/10/2024 03:56 PM EDT Phone (Incoming) [name], [name] [phone]   Routing History   From To Priority  02/10/2024 04:12 PM [name], RN P [facility] Routine  02/10/2024 04:04 PM [name] M P E2C2 NURSE TRIAGE POOL - PRIMARY CARE Routine

## 2024-02-11 ENCOUNTER — Other Ambulatory Visit: Payer: Self-pay

## 2024-02-11 ENCOUNTER — Other Ambulatory Visit: Payer: Self-pay | Admitting: *Deleted

## 2024-02-11 NOTE — Patient Instructions (Signed)
 Visit Information  Thank you for taking time to visit with me today. Please don't hesitate to contact me if I can be of assistance to you before our next scheduled appointment.  Your next care management appointment is by telephone on 03-10-2024 at 11:45 am  Telephone follow-up in 1 month  Please call the care guide team at [phone] if you need to cancel, schedule, or reschedule an appointment.   Please call the Suicide and Crisis Lifeline: 988 call the [place]  National Suicide Prevention Lifeline: (684)295-5314 or TTY: 938-642-6077 TTY 3527855558) to talk to a trained counselor call 1-800-273-TALK (toll free, 24 hour hotline) go to [facility] [address] [place] [phone]) if you are experiencing a Mental Health or Behavioral Health Crisis or need someone to talk to.  Grandville Lax, BSN RN [facility], [facility] RN Care Manager Direct Dial: [phone]  Fax: [phone]

## 2024-02-11 NOTE — Patient Outreach (Signed)
 Complex Care Management   Visit Note  02/11/2024  Name:  Antonio Serrano MRN: [id number] DOB: [birth year]  Situation: Referral received for Complex Care Management related to Diabetes with Complications I obtained verbal consent from Patient.  Visit completed with patient  on the phone  Background:   Past Medical History:  Diagnosis Date   Atherosclerosis of coronary artery    On chest CT->calcif in LAD and RCA   Cerebrovascular disease    03/2023 MRI brain showed chronic microvasc isch changes and small remote infarcts   Chronic low back pain    MR 11/2021 showed L4-5 foraminal stenosis--> plan per Ortho was to do L4-5 injection   Chronic prostatitis    Very mild elevation of PSA (>4), referred to Urol where PSA repeat was 1.0 on 06/16/13.  Annual PSA repeat is all that is needed now per urologist.  Most recent 07/2015 was 0.45.   Chronic renal insufficiency, stage III (moderate) (HCC)    Baseline GFR as of 2019= 50s.     Closed right hip fracture (HCC) 03/2021   THA   Debilitated    Diabetes mellitus with complication (HCC) Dx'd fall 2011   Gastritis and gastroduodenitis 06/16/2023   HTN (hypertension)    Renal/aortic doppler u/s normal 06/2010   Hypercalcemia    Hyperlipidemia 2016   Statin intolerant--myalgias.  Pt refuses any further trial of statin as of 2018.   Nonspecific abnormal electrocardiogram (ECG) (EKG) Fall 2011   TWI in inferior leads: myocardial perfusion scan neg and echo normal 07/2010   PAF (paroxysmal atrial fibrillation) (HCC)    2024   Pseudomonas aeruginosa colonization    12/2021 urine clx   SBO (small bowel obstruction) (HCC) 10/29/2023   Closed-loop obstruction due to adhesions from chronic appendicitis--> resolved status post appendectomy   TIA (transient ischemic attack)    12/2021- CT angio head/neck no high grade stenosis.  MR showed old lacunar infarcts corona radiata, basal ganglia, thalamus.  Echo normal.   Tobacco dependence    Unstable gait     UTI (lower urinary tract infection)    Feb 2012 (klebsiella--dx at [facility]); 03/2013 e coli.     Assessment: Patient Reported Symptoms:  Cognitive Cognitive Status: Alert and oriented to person, place, and time, Able to follow simple commands Cognitive/Intellectual Conditions Management [RPT]: None reported or documented in medical history or problem list   Health Maintenance Behaviors: Annual physical exam Healing Pattern: Average Health Facilitated by: Rest  Neurological Neurological Review of Symptoms: No symptoms reported    HEENT HEENT Symptoms Reported: No symptoms reported      Cardiovascular Cardiovascular Symptoms Reported: No symptoms reported Does patient have uncontrolled Hypertension?: Yes Is patient checking Blood Pressure at home?: Yes Patient's Recent BP reading at home: 175/77 Cardiovascular Conditions: Hypertension Cardiovascular Management Strategies: Medication therapy, Routine screening Cardiovascular Self-Management Outcome: 3 (uncertain)  Respiratory Respiratory Symptoms Reported: No symptoms reported    Endocrine Patient reports the following symptoms related to hypoglycemia or hyperglycemia : No symptoms reported Is patient diabetic?: No Endocrine Management Strategies: Routine screening  Gastrointestinal Gastrointestinal Symptoms Reported: Constipation Gastrointestinal Conditions: Constipation Gastrointestinal Management Strategies: Medication therapy Gastrointestinal Self-Management Outcome: 3 (uncertain) Nutrition Risk Screen (CP): No indicators present  Genitourinary Genitourinary Symptoms Reported: Frequency Genitourinary Conditions: Frequency Genitourinary Management Strategies: Medication therapy  Integumentary Integumentary Symptoms Reported: No symptoms reported    Musculoskeletal Musculoskelatal Symptoms Reviewed: Unsteady gait, Weakness, Difficulty walking Musculoskeletal Conditions: Mobility limited Musculoskeletal Management Strategies:  Routine screening Musculoskeletal Self-Management Outcome: 3 (uncertain) Falls  in the past year?: Yes Number of falls in past year: 2 or more Was there an injury with Fall?: Yes Fall Risk Category Calculator: 3 Patient Fall Risk Level: High Fall Risk Patient at Risk for Falls Due to: History of fall(s), Medication side effect, Impaired mobility, Impaired balance/gait Fall risk Follow up: Falls evaluation completed, Education provided, Falls prevention discussed  Psychosocial Psychosocial Symptoms Reported: No symptoms reported     Quality of Family Relationships: helpful, involved, supportive Do you feel physically threatened by others?: No      02/11/2024    3:55 PM  Depression screen PHQ 2/9  Decreased Interest 0  Down, Depressed, Hopeless 0  PHQ - 2 Score 0    Vitals:   02/10/24 1545 02/11/24 1543  BP: 133/76 (!) 175/77    Medications Reviewed Today     Reviewed by Remona Carmel, RN (Registered Nurse) on 02/11/24 at 1550  Med List Status: <None>   Medication Order Taking? Sig Documenting Provider Last Dose Status Informant  amLODipine  (NORVASC ) 2.5 MG tablet [id number] No Take 1 tablet (2.5 mg total) by mouth daily.  Patient not taking: Reported on 02/11/2024   Shelvia Dick, MD Not Taking Active Multiple Informants           Med Note Remona Carmel   Tue Feb 11, 2024  3:49 PM) Patient takes as needed due to dizziness  clopidogrel  (PLAVIX ) 75 MG tablet [id number] Yes Take 1 tablet by mouth once daily [name], Minetta Aly, MD Taking Active   ipratropium (ATROVENT ) 0.03 % nasal spray [id number] Yes USE 2 SPRAY(S) IN EACH NOSTRIL EVERY 12 HOURS  Patient taking differently: Place 2 sprays into both nostrils every 12 (twelve) hours as needed for rhinitis.   Shelvia Dick, MD Taking Active Multiple Informants           Med Note Mliss Anderson Jewelene Morton   Fri Jun 14, 2023  4:46 PM)    MYRBETRIQ  50 MG TB24 tablet [id number] Yes Take 1 tablet (50 mg total) by mouth  daily. [name], Philip H, MD Taking Active   pantoprazole  (PROTONIX ) 40 MG tablet [id number] Yes Take 1 tablet (40 mg total) by mouth 2 (two) times daily before a meal.  Patient taking differently: Take 40 mg by mouth daily before breakfast.   Macdonald Savoy, MD Taking Active Multiple Informants  polyethylene glycol (MIRALAX  / GLYCOLAX ) 17 g packet [id number] Yes Take 17 g by mouth 2 (two) times daily. Candyce Champagne, MD Taking Active   sodium chloride  1 g tablet [id number] Yes Take 1 tablet (1 g total) by mouth 2 (two) times daily with a meal. [name], Minetta Aly, MD Taking Active Multiple Informants  sucralfate  (CARAFATE ) 1 GM/10ML suspension [id number]  Take 10 mLs (1 g total) by mouth 4 (four) times daily -  with meals and at bedtime.  Patient not taking: Reported on 01/03/2024   Macdonald Savoy, MD  Expired 01/03/24 2359 Multiple Informants  traZODone  (DESYREL ) 50 MG tablet [id number] Yes TAKE 1 TABLET BY MOUTH EVERY DAY AT BEDTIME AS NEEDED FOR SLEEP [name], Minetta Aly, MD Taking Active             Recommendation:   Referral to: SW  Follow Up Plan:   Telephone follow up appointment date/time:  03-10-2024 at 11:45 am  Grandville Lax, BSN RN [facility], [facility] RN Care Manager Direct Dial: [phone]  Fax: [phone]

## 2024-02-13 ENCOUNTER — Other Ambulatory Visit: Payer: Self-pay | Admitting: Licensed Clinical Social Worker

## 2024-02-14 DIAGNOSIS — Z9049 Acquired absence of other specified parts of digestive tract: Secondary | ICD-10-CM | POA: Diagnosis not present

## 2024-02-14 DIAGNOSIS — I129 Hypertensive chronic kidney disease with stage 1 through stage 4 chronic kidney disease, or unspecified chronic kidney disease: Secondary | ICD-10-CM | POA: Diagnosis not present

## 2024-02-14 DIAGNOSIS — Z8673 Personal history of transient ischemic attack (TIA), and cerebral infarction without residual deficits: Secondary | ICD-10-CM | POA: Diagnosis not present

## 2024-02-14 DIAGNOSIS — K5790 Diverticulosis of intestine, part unspecified, without perforation or abscess without bleeding: Secondary | ICD-10-CM | POA: Diagnosis not present

## 2024-02-14 DIAGNOSIS — I2510 Atherosclerotic heart disease of native coronary artery without angina pectoris: Secondary | ICD-10-CM | POA: Diagnosis not present

## 2024-02-14 DIAGNOSIS — M5136 Other intervertebral disc degeneration, lumbar region with discogenic back pain only: Secondary | ICD-10-CM | POA: Diagnosis not present

## 2024-02-14 DIAGNOSIS — E1122 Type 2 diabetes mellitus with diabetic chronic kidney disease: Secondary | ICD-10-CM | POA: Diagnosis not present

## 2024-02-14 DIAGNOSIS — R1310 Dysphagia, unspecified: Secondary | ICD-10-CM | POA: Diagnosis not present

## 2024-02-14 DIAGNOSIS — F17210 Nicotine dependence, cigarettes, uncomplicated: Secondary | ICD-10-CM | POA: Diagnosis not present

## 2024-02-14 DIAGNOSIS — Z556 Problems related to health literacy: Secondary | ICD-10-CM | POA: Diagnosis not present

## 2024-02-14 DIAGNOSIS — E785 Hyperlipidemia, unspecified: Secondary | ICD-10-CM | POA: Diagnosis not present

## 2024-02-14 DIAGNOSIS — Z87440 Personal history of urinary (tract) infections: Secondary | ICD-10-CM | POA: Diagnosis not present

## 2024-02-14 DIAGNOSIS — N1831 Chronic kidney disease, stage 3a: Secondary | ICD-10-CM | POA: Diagnosis not present

## 2024-02-14 DIAGNOSIS — Z7902 Long term (current) use of antithrombotics/antiplatelets: Secondary | ICD-10-CM | POA: Diagnosis not present

## 2024-02-14 DIAGNOSIS — K219 Gastro-esophageal reflux disease without esophagitis: Secondary | ICD-10-CM | POA: Diagnosis not present

## 2024-02-15 DIAGNOSIS — Z87440 Personal history of urinary (tract) infections: Secondary | ICD-10-CM | POA: Diagnosis not present

## 2024-02-15 DIAGNOSIS — E1122 Type 2 diabetes mellitus with diabetic chronic kidney disease: Secondary | ICD-10-CM | POA: Diagnosis not present

## 2024-02-15 DIAGNOSIS — M5136 Other intervertebral disc degeneration, lumbar region with discogenic back pain only: Secondary | ICD-10-CM | POA: Diagnosis not present

## 2024-02-15 DIAGNOSIS — Z556 Problems related to health literacy: Secondary | ICD-10-CM | POA: Diagnosis not present

## 2024-02-15 DIAGNOSIS — K5790 Diverticulosis of intestine, part unspecified, without perforation or abscess without bleeding: Secondary | ICD-10-CM | POA: Diagnosis not present

## 2024-02-15 DIAGNOSIS — Z8673 Personal history of transient ischemic attack (TIA), and cerebral infarction without residual deficits: Secondary | ICD-10-CM | POA: Diagnosis not present

## 2024-02-15 DIAGNOSIS — I2510 Atherosclerotic heart disease of native coronary artery without angina pectoris: Secondary | ICD-10-CM | POA: Diagnosis not present

## 2024-02-15 DIAGNOSIS — Z9049 Acquired absence of other specified parts of digestive tract: Secondary | ICD-10-CM | POA: Diagnosis not present

## 2024-02-15 DIAGNOSIS — Z7902 Long term (current) use of antithrombotics/antiplatelets: Secondary | ICD-10-CM | POA: Diagnosis not present

## 2024-02-15 DIAGNOSIS — I129 Hypertensive chronic kidney disease with stage 1 through stage 4 chronic kidney disease, or unspecified chronic kidney disease: Secondary | ICD-10-CM | POA: Diagnosis not present

## 2024-02-15 DIAGNOSIS — E785 Hyperlipidemia, unspecified: Secondary | ICD-10-CM | POA: Diagnosis not present

## 2024-02-15 DIAGNOSIS — K219 Gastro-esophageal reflux disease without esophagitis: Secondary | ICD-10-CM | POA: Diagnosis not present

## 2024-02-15 DIAGNOSIS — F17210 Nicotine dependence, cigarettes, uncomplicated: Secondary | ICD-10-CM | POA: Diagnosis not present

## 2024-02-15 DIAGNOSIS — R1310 Dysphagia, unspecified: Secondary | ICD-10-CM | POA: Diagnosis not present

## 2024-02-15 DIAGNOSIS — N1831 Chronic kidney disease, stage 3a: Secondary | ICD-10-CM | POA: Diagnosis not present

## 2024-02-17 ENCOUNTER — Telehealth: Payer: Self-pay

## 2024-02-17 NOTE — Telephone Encounter (Signed)
 LVM for pt's daughter to return call. We would need to know if the facility has their own FL2 form to be completed. If so, it can be faxed to us  at [phone].

## 2024-02-17 NOTE — Telephone Encounter (Signed)
 Copied from CRM [id number]. Topic: General - Other >> Feb 17, 2024  1:14 PM Luane Rumps D wrote: Reason for CRM: Patient's daughter calling because she spoke with the nursing home regarding long term in-patient care and was told that in order for them to accept him as a patient that they would need FL2 FORM, Last doctors visit, list of medications, plan of care. She spoke with [facility]. She is requesting these forms or direction on how to get process going.

## 2024-02-18 DIAGNOSIS — K5790 Diverticulosis of intestine, part unspecified, without perforation or abscess without bleeding: Secondary | ICD-10-CM | POA: Diagnosis not present

## 2024-02-18 DIAGNOSIS — Z7902 Long term (current) use of antithrombotics/antiplatelets: Secondary | ICD-10-CM | POA: Diagnosis not present

## 2024-02-18 DIAGNOSIS — I129 Hypertensive chronic kidney disease with stage 1 through stage 4 chronic kidney disease, or unspecified chronic kidney disease: Secondary | ICD-10-CM | POA: Diagnosis not present

## 2024-02-18 DIAGNOSIS — Z8673 Personal history of transient ischemic attack (TIA), and cerebral infarction without residual deficits: Secondary | ICD-10-CM | POA: Diagnosis not present

## 2024-02-18 DIAGNOSIS — E1122 Type 2 diabetes mellitus with diabetic chronic kidney disease: Secondary | ICD-10-CM | POA: Diagnosis not present

## 2024-02-18 DIAGNOSIS — Z87440 Personal history of urinary (tract) infections: Secondary | ICD-10-CM | POA: Diagnosis not present

## 2024-02-18 DIAGNOSIS — M5136 Other intervertebral disc degeneration, lumbar region with discogenic back pain only: Secondary | ICD-10-CM | POA: Diagnosis not present

## 2024-02-18 DIAGNOSIS — R1310 Dysphagia, unspecified: Secondary | ICD-10-CM | POA: Diagnosis not present

## 2024-02-18 DIAGNOSIS — E785 Hyperlipidemia, unspecified: Secondary | ICD-10-CM | POA: Diagnosis not present

## 2024-02-18 DIAGNOSIS — K219 Gastro-esophageal reflux disease without esophagitis: Secondary | ICD-10-CM | POA: Diagnosis not present

## 2024-02-18 DIAGNOSIS — I2510 Atherosclerotic heart disease of native coronary artery without angina pectoris: Secondary | ICD-10-CM | POA: Diagnosis not present

## 2024-02-18 DIAGNOSIS — Z556 Problems related to health literacy: Secondary | ICD-10-CM | POA: Diagnosis not present

## 2024-02-18 DIAGNOSIS — Z9049 Acquired absence of other specified parts of digestive tract: Secondary | ICD-10-CM | POA: Diagnosis not present

## 2024-02-18 DIAGNOSIS — N1831 Chronic kidney disease, stage 3a: Secondary | ICD-10-CM | POA: Diagnosis not present

## 2024-02-18 DIAGNOSIS — F17210 Nicotine dependence, cigarettes, uncomplicated: Secondary | ICD-10-CM | POA: Diagnosis not present

## 2024-02-18 NOTE — Telephone Encounter (Signed)
 Copied from CRM [id number]. Topic: General - Other >> Feb 17, 2024  1:14 PM Luane Rumps D wrote: Reason for CRM: Patient's daughter calling because she spoke with the nursing home regarding long term in-patient care and was told that in order for them to accept him as a patient that they would need FL2 FORM, Last doctors visit, list of medications, plan of care. She spoke with [facility]. She is requesting these forms or direction on how to get process going. >> Feb 18, 2024 12:03 PM Clydene Darner H wrote: [name] is returning a missed call from the nurse on yesterday to follow up regarding the patient's potential admission to [facility]. She stated that a representative from the facility confirmed they will accept the FL2 form for the month of April. However, additional medical records are required for the patient to receive the appropriate level of care while at the facility.[name] was informed that all necessary documentation must be submitted within 8 days in order to secure a bed for the patient. She also requested a copy of the form and submitted records once the information has been completed to send to DSS also.   Fax Number for Augustin Leber [phone] >> Feb 17, 2024  3:45 PM [place] E wrote: [name] called back in regards to this FL2 form. Stated the facility does not have their own form, and that the office should have one that was filled out for the patient around the end of April. Callback number for [name] is [phone].

## 2024-02-20 NOTE — Telephone Encounter (Signed)
 Form received by [name] has been completed by PCP and faxed back. Patient's daughter Antonio Serrano was made aware, a copy of completed forms was placed at the front in the brown folder for pick up.

## 2024-02-21 DIAGNOSIS — Z8673 Personal history of transient ischemic attack (TIA), and cerebral infarction without residual deficits: Secondary | ICD-10-CM | POA: Diagnosis not present

## 2024-02-21 DIAGNOSIS — Z87440 Personal history of urinary (tract) infections: Secondary | ICD-10-CM | POA: Diagnosis not present

## 2024-02-21 DIAGNOSIS — Z9049 Acquired absence of other specified parts of digestive tract: Secondary | ICD-10-CM | POA: Diagnosis not present

## 2024-02-21 DIAGNOSIS — F17210 Nicotine dependence, cigarettes, uncomplicated: Secondary | ICD-10-CM | POA: Diagnosis not present

## 2024-02-21 DIAGNOSIS — N1831 Chronic kidney disease, stage 3a: Secondary | ICD-10-CM | POA: Diagnosis not present

## 2024-02-21 DIAGNOSIS — K5790 Diverticulosis of intestine, part unspecified, without perforation or abscess without bleeding: Secondary | ICD-10-CM | POA: Diagnosis not present

## 2024-02-21 DIAGNOSIS — Z7902 Long term (current) use of antithrombotics/antiplatelets: Secondary | ICD-10-CM | POA: Diagnosis not present

## 2024-02-21 DIAGNOSIS — Z556 Problems related to health literacy: Secondary | ICD-10-CM | POA: Diagnosis not present

## 2024-02-21 DIAGNOSIS — K219 Gastro-esophageal reflux disease without esophagitis: Secondary | ICD-10-CM | POA: Diagnosis not present

## 2024-02-21 DIAGNOSIS — M5136 Other intervertebral disc degeneration, lumbar region with discogenic back pain only: Secondary | ICD-10-CM | POA: Diagnosis not present

## 2024-02-21 DIAGNOSIS — R1310 Dysphagia, unspecified: Secondary | ICD-10-CM | POA: Diagnosis not present

## 2024-02-21 DIAGNOSIS — E785 Hyperlipidemia, unspecified: Secondary | ICD-10-CM | POA: Diagnosis not present

## 2024-02-21 DIAGNOSIS — I2510 Atherosclerotic heart disease of native coronary artery without angina pectoris: Secondary | ICD-10-CM | POA: Diagnosis not present

## 2024-02-21 DIAGNOSIS — I129 Hypertensive chronic kidney disease with stage 1 through stage 4 chronic kidney disease, or unspecified chronic kidney disease: Secondary | ICD-10-CM | POA: Diagnosis not present

## 2024-02-21 DIAGNOSIS — E1122 Type 2 diabetes mellitus with diabetic chronic kidney disease: Secondary | ICD-10-CM | POA: Diagnosis not present

## 2024-02-25 DIAGNOSIS — E1122 Type 2 diabetes mellitus with diabetic chronic kidney disease: Secondary | ICD-10-CM | POA: Diagnosis not present

## 2024-02-25 DIAGNOSIS — Z556 Problems related to health literacy: Secondary | ICD-10-CM | POA: Diagnosis not present

## 2024-02-25 DIAGNOSIS — Z87440 Personal history of urinary (tract) infections: Secondary | ICD-10-CM | POA: Diagnosis not present

## 2024-02-25 DIAGNOSIS — I2510 Atherosclerotic heart disease of native coronary artery without angina pectoris: Secondary | ICD-10-CM | POA: Diagnosis not present

## 2024-02-25 DIAGNOSIS — Z7902 Long term (current) use of antithrombotics/antiplatelets: Secondary | ICD-10-CM | POA: Diagnosis not present

## 2024-02-25 DIAGNOSIS — N1831 Chronic kidney disease, stage 3a: Secondary | ICD-10-CM | POA: Diagnosis not present

## 2024-02-25 DIAGNOSIS — K5790 Diverticulosis of intestine, part unspecified, without perforation or abscess without bleeding: Secondary | ICD-10-CM | POA: Diagnosis not present

## 2024-02-25 DIAGNOSIS — M5136 Other intervertebral disc degeneration, lumbar region with discogenic back pain only: Secondary | ICD-10-CM | POA: Diagnosis not present

## 2024-02-25 DIAGNOSIS — Z8673 Personal history of transient ischemic attack (TIA), and cerebral infarction without residual deficits: Secondary | ICD-10-CM | POA: Diagnosis not present

## 2024-02-25 DIAGNOSIS — K219 Gastro-esophageal reflux disease without esophagitis: Secondary | ICD-10-CM | POA: Diagnosis not present

## 2024-02-25 DIAGNOSIS — I129 Hypertensive chronic kidney disease with stage 1 through stage 4 chronic kidney disease, or unspecified chronic kidney disease: Secondary | ICD-10-CM | POA: Diagnosis not present

## 2024-02-25 DIAGNOSIS — E785 Hyperlipidemia, unspecified: Secondary | ICD-10-CM | POA: Diagnosis not present

## 2024-02-25 DIAGNOSIS — Z9049 Acquired absence of other specified parts of digestive tract: Secondary | ICD-10-CM | POA: Diagnosis not present

## 2024-02-25 DIAGNOSIS — R1310 Dysphagia, unspecified: Secondary | ICD-10-CM | POA: Diagnosis not present

## 2024-02-25 DIAGNOSIS — F17210 Nicotine dependence, cigarettes, uncomplicated: Secondary | ICD-10-CM | POA: Diagnosis not present

## 2024-02-25 NOTE — Patient Instructions (Signed)
 Visit Information  Thank you for taking time to visit with me today. Please don't hesitate to contact me if I can be of assistance to you before our next scheduled appointment.  Your next care management appointment is by telephone on 06/10 at 12:30 PM  Please call the care guide team at [phone] if you need to cancel, schedule, or reschedule an appointment.   Please call the Suicide and Crisis Lifeline: 988 go to [facility] [address] [place] [phone]) call 911 if you are experiencing a Mental Health or Behavioral Health Crisis or need someone to talk to.  Alease Hunter, LCSW [place]  [facility], [facility] Clinical Social Worker Direct Dial: [phone]  Fax: [phone] Website: [email or web address] 5:03 AM

## 2024-02-25 NOTE — Patient Outreach (Signed)
 Complex Care Management   Visit Note  02/13/2024  Name:  Antonio Serrano MRN: [id number] DOB: Jun 14, 1946  Situation: Referral received for Complex Care Management related to Diabetes with Complications and Chronic Kidney Disease I obtained verbal consent from Patient.  Visit completed with Caregiver, [name]  on the phone  Background:   Past Medical History:  Diagnosis Date   Atherosclerosis of coronary artery    On chest CT->calcif in LAD and RCA   Cerebrovascular disease    03/2023 MRI brain showed chronic microvasc isch changes and small remote infarcts   Chronic low back pain    MR 11/2021 showed L4-5 foraminal stenosis--> plan per Ortho was to do L4-5 injection   Chronic prostatitis    Very mild elevation of PSA (>4), referred to Urol where PSA repeat was 1.0 on 06/16/13.  Annual PSA repeat is all that is needed now per urologist.  Most recent 07/2015 was 0.45.   Chronic renal insufficiency, stage III (moderate) (HCC)    Baseline GFR as of 2019= 50s.     Closed right hip fracture (HCC) 03/2021   THA   Debilitated    Diabetes mellitus with complication (HCC) Dx'd fall 2011   Gastritis and gastroduodenitis 06/16/2023   HTN (hypertension)    Renal/aortic doppler u/s normal 06/2010   Hypercalcemia    Hyperlipidemia 2016   Statin intolerant--myalgias.  Pt refuses any further trial of statin as of 2018.   Nonspecific abnormal electrocardiogram (ECG) (EKG) Fall 2011   TWI in inferior leads: myocardial perfusion scan neg and echo normal 07/2010   PAF (paroxysmal atrial fibrillation) (HCC)    2024   Pseudomonas aeruginosa colonization    12/2021 urine clx   SBO (small bowel obstruction) (HCC) 10/29/2023   Closed-loop obstruction due to adhesions from chronic appendicitis--> resolved status post appendectomy   TIA (transient ischemic attack)    12/2021- CT angio head/neck no high grade stenosis.  MR showed old lacunar infarcts corona radiata, basal ganglia, thalamus.  Echo normal.   Tobacco  dependence    Unstable gait    UTI (lower urinary tract infection)    Feb 2012 (klebsiella--dx at [facility]); 03/2013 e coli.     Assessment: Patient Reported Symptoms:  Cognitive Cognitive Status: Alert and oriented to person, place, and time, Able to follow simple commands Cognitive/Intellectual Conditions Management [RPT]: None reported or documented in medical history or problem list      Neurological Neurological Review of Symptoms: No symptoms reported    HEENT HEENT Symptoms Reported: No symptoms reported      Cardiovascular Cardiovascular Symptoms Reported: No symptoms reported    Respiratory Respiratory Symptoms Reported: No symptoms reported    Endocrine Patient reports the following symptoms related to hypoglycemia or hyperglycemia : No symptoms reported Is patient diabetic?: Yes Endocrine Conditions: Diabetes Endocrine Management Strategies: Routine screening, Medication therapy  Gastrointestinal Gastrointestinal Symptoms Reported: No symptoms reported      Genitourinary Genitourinary Symptoms Reported: No symptoms reported Genitourinary Conditions: Chronic kidney disease  Integumentary Integumentary Symptoms Reported: No symptoms reported    Musculoskeletal Musculoskelatal Symptoms Reviewed: Difficulty walking, Unsteady gait, Weakness Musculoskeletal Conditions: Mobility limited Musculoskeletal Management Strategies: Routine screening      Psychosocial Psychosocial Symptoms Reported: No symptoms reported Additional Psychological Details: Family are in the process of obtaining [facility] to assist with placement. Supportive resources provided. Pt has not endorsed depression or anxiety symptoms. Strategies discussed to assist with management of caregiver stress  02/11/2024    3:55 PM  Depression screen PHQ 2/9  Decreased Interest 0  Down, Depressed, Hopeless 0  PHQ - 2 Score 0    There were no vitals filed for this visit.  Medications Reviewed  Today     Reviewed by Laurence Crofford D, LCSW (Social Worker) on 02/25/24 at 0451  Med List Status: <None>   Medication Order Taking? Sig Documenting Provider Last Dose Status Informant  amLODipine  (NORVASC ) 2.5 MG tablet [id number] No Take 1 tablet (2.5 mg total) by mouth daily.  Patient not taking: Reported on 02/11/2024   Shelvia Dick, MD Not Taking Active Multiple Informants           Med Note Remona Carmel   Tue Feb 11, 2024  3:49 PM) Patient takes as needed due to dizziness  clopidogrel  (PLAVIX ) 75 MG tablet [id number] No Take 1 tablet by mouth once daily [name], Minetta Aly, MD Taking Active   ipratropium (ATROVENT ) 0.03 % nasal spray [id number] No USE 2 SPRAY(S) IN EACH NOSTRIL EVERY 12 HOURS  Patient taking differently: Place 2 sprays into both nostrils every 12 (twelve) hours as needed for rhinitis.   Shelvia Dick, MD Taking Active Multiple Informants           Med Note Mliss Anderson Jewelene Morton   Fri Jun 14, 2023  4:46 PM)    MYRBETRIQ  50 MG TB24 tablet [id number] No Take 1 tablet (50 mg total) by mouth daily. [name], Minetta Aly, MD Taking Active   pantoprazole  (PROTONIX ) 40 MG tablet [id number] No Take 1 tablet (40 mg total) by mouth 2 (two) times daily before a meal.  Patient taking differently: Take 40 mg by mouth daily before breakfast.   Macdonald Savoy, MD Taking Active Multiple Informants  polyethylene glycol (MIRALAX  / GLYCOLAX ) 17 g packet [id number] No Take 17 g by mouth 2 (two) times daily. Candyce Champagne, MD Taking Active   sodium chloride  1 g tablet [id number] No Take 1 tablet (1 g total) by mouth 2 (two) times daily with a meal. [name], Minetta Aly, MD Taking Active Multiple Informants  sucralfate  (CARAFATE ) 1 GM/10ML suspension [id number] No Take 10 mLs (1 g total) by mouth 4 (four) times daily -  with meals and at bedtime.  Patient not taking: Reported on 01/03/2024   Macdonald Savoy, MD Not Taking Expired 01/03/24 2359 Multiple Informants  traZODone   (DESYREL ) 50 MG tablet [id number] No TAKE 1 TABLET BY MOUTH EVERY DAY AT BEDTIME AS NEEDED FOR SLEEP [name], Minetta Aly, MD Taking Active             Recommendation:   Continue Current Plan of Care  Follow Up Plan:   Telephone follow-up 2-4 weeks  Alease Hunter, LCSW [facility]  [facility], [facility] Clinical Social Worker Direct Dial: [phone]  Fax: [phone] Website: [email or web address] 5:02 AM

## 2024-02-28 DIAGNOSIS — R269 Unspecified abnormalities of gait and mobility: Secondary | ICD-10-CM | POA: Diagnosis not present

## 2024-02-28 DIAGNOSIS — I129 Hypertensive chronic kidney disease with stage 1 through stage 4 chronic kidney disease, or unspecified chronic kidney disease: Secondary | ICD-10-CM | POA: Diagnosis not present

## 2024-02-28 DIAGNOSIS — R2689 Other abnormalities of gait and mobility: Secondary | ICD-10-CM | POA: Diagnosis not present

## 2024-02-28 DIAGNOSIS — M6281 Muscle weakness (generalized): Secondary | ICD-10-CM | POA: Diagnosis not present

## 2024-02-28 DIAGNOSIS — R296 Repeated falls: Secondary | ICD-10-CM | POA: Diagnosis not present

## 2024-02-29 DIAGNOSIS — R2689 Other abnormalities of gait and mobility: Secondary | ICD-10-CM | POA: Diagnosis not present

## 2024-02-29 DIAGNOSIS — M6281 Muscle weakness (generalized): Secondary | ICD-10-CM | POA: Diagnosis not present

## 2024-02-29 DIAGNOSIS — I129 Hypertensive chronic kidney disease with stage 1 through stage 4 chronic kidney disease, or unspecified chronic kidney disease: Secondary | ICD-10-CM | POA: Diagnosis not present

## 2024-02-29 DIAGNOSIS — R269 Unspecified abnormalities of gait and mobility: Secondary | ICD-10-CM | POA: Diagnosis not present

## 2024-02-29 DIAGNOSIS — R296 Repeated falls: Secondary | ICD-10-CM | POA: Diagnosis not present

## 2024-03-01 DIAGNOSIS — M6281 Muscle weakness (generalized): Secondary | ICD-10-CM | POA: Diagnosis not present

## 2024-03-01 DIAGNOSIS — I129 Hypertensive chronic kidney disease with stage 1 through stage 4 chronic kidney disease, or unspecified chronic kidney disease: Secondary | ICD-10-CM | POA: Diagnosis not present

## 2024-03-01 DIAGNOSIS — R269 Unspecified abnormalities of gait and mobility: Secondary | ICD-10-CM | POA: Diagnosis not present

## 2024-03-01 DIAGNOSIS — R296 Repeated falls: Secondary | ICD-10-CM | POA: Diagnosis not present

## 2024-03-01 DIAGNOSIS — R2689 Other abnormalities of gait and mobility: Secondary | ICD-10-CM | POA: Diagnosis not present

## 2024-03-02 DIAGNOSIS — R2689 Other abnormalities of gait and mobility: Secondary | ICD-10-CM | POA: Diagnosis not present

## 2024-03-02 DIAGNOSIS — R269 Unspecified abnormalities of gait and mobility: Secondary | ICD-10-CM | POA: Diagnosis not present

## 2024-03-02 DIAGNOSIS — I480 Paroxysmal atrial fibrillation: Secondary | ICD-10-CM | POA: Diagnosis not present

## 2024-03-02 DIAGNOSIS — M6281 Muscle weakness (generalized): Secondary | ICD-10-CM | POA: Diagnosis not present

## 2024-03-02 DIAGNOSIS — R296 Repeated falls: Secondary | ICD-10-CM | POA: Diagnosis not present

## 2024-03-02 DIAGNOSIS — I679 Cerebrovascular disease, unspecified: Secondary | ICD-10-CM | POA: Diagnosis not present

## 2024-03-02 DIAGNOSIS — I129 Hypertensive chronic kidney disease with stage 1 through stage 4 chronic kidney disease, or unspecified chronic kidney disease: Secondary | ICD-10-CM | POA: Diagnosis not present

## 2024-03-02 DIAGNOSIS — Z8673 Personal history of transient ischemic attack (TIA), and cerebral infarction without residual deficits: Secondary | ICD-10-CM | POA: Diagnosis not present

## 2024-03-02 DIAGNOSIS — I2510 Atherosclerotic heart disease of native coronary artery without angina pectoris: Secondary | ICD-10-CM | POA: Diagnosis not present

## 2024-03-03 DIAGNOSIS — R296 Repeated falls: Secondary | ICD-10-CM | POA: Diagnosis not present

## 2024-03-03 DIAGNOSIS — M6281 Muscle weakness (generalized): Secondary | ICD-10-CM | POA: Diagnosis not present

## 2024-03-03 DIAGNOSIS — R2689 Other abnormalities of gait and mobility: Secondary | ICD-10-CM | POA: Diagnosis not present

## 2024-03-03 DIAGNOSIS — I129 Hypertensive chronic kidney disease with stage 1 through stage 4 chronic kidney disease, or unspecified chronic kidney disease: Secondary | ICD-10-CM | POA: Diagnosis not present

## 2024-03-03 DIAGNOSIS — R269 Unspecified abnormalities of gait and mobility: Secondary | ICD-10-CM | POA: Diagnosis not present

## 2024-03-04 DIAGNOSIS — R2689 Other abnormalities of gait and mobility: Secondary | ICD-10-CM | POA: Diagnosis not present

## 2024-03-04 DIAGNOSIS — E871 Hypo-osmolality and hyponatremia: Secondary | ICD-10-CM | POA: Diagnosis not present

## 2024-03-04 DIAGNOSIS — I129 Hypertensive chronic kidney disease with stage 1 through stage 4 chronic kidney disease, or unspecified chronic kidney disease: Secondary | ICD-10-CM | POA: Diagnosis not present

## 2024-03-04 DIAGNOSIS — R296 Repeated falls: Secondary | ICD-10-CM | POA: Diagnosis not present

## 2024-03-04 DIAGNOSIS — E1121 Type 2 diabetes mellitus with diabetic nephropathy: Secondary | ICD-10-CM | POA: Diagnosis not present

## 2024-03-04 DIAGNOSIS — I10 Essential (primary) hypertension: Secondary | ICD-10-CM | POA: Diagnosis not present

## 2024-03-04 DIAGNOSIS — N1831 Chronic kidney disease, stage 3a: Secondary | ICD-10-CM | POA: Diagnosis not present

## 2024-03-04 DIAGNOSIS — R269 Unspecified abnormalities of gait and mobility: Secondary | ICD-10-CM | POA: Diagnosis not present

## 2024-03-04 DIAGNOSIS — I480 Paroxysmal atrial fibrillation: Secondary | ICD-10-CM | POA: Diagnosis not present

## 2024-03-04 DIAGNOSIS — E559 Vitamin D deficiency, unspecified: Secondary | ICD-10-CM | POA: Diagnosis not present

## 2024-03-04 DIAGNOSIS — M6281 Muscle weakness (generalized): Secondary | ICD-10-CM | POA: Diagnosis not present

## 2024-03-05 DIAGNOSIS — E871 Hypo-osmolality and hyponatremia: Secondary | ICD-10-CM | POA: Diagnosis not present

## 2024-03-05 DIAGNOSIS — I129 Hypertensive chronic kidney disease with stage 1 through stage 4 chronic kidney disease, or unspecified chronic kidney disease: Secondary | ICD-10-CM | POA: Diagnosis not present

## 2024-03-05 DIAGNOSIS — N1831 Chronic kidney disease, stage 3a: Secondary | ICD-10-CM | POA: Diagnosis not present

## 2024-03-05 DIAGNOSIS — E118 Type 2 diabetes mellitus with unspecified complications: Secondary | ICD-10-CM | POA: Diagnosis not present

## 2024-03-06 DIAGNOSIS — R269 Unspecified abnormalities of gait and mobility: Secondary | ICD-10-CM | POA: Diagnosis not present

## 2024-03-06 DIAGNOSIS — I129 Hypertensive chronic kidney disease with stage 1 through stage 4 chronic kidney disease, or unspecified chronic kidney disease: Secondary | ICD-10-CM | POA: Diagnosis not present

## 2024-03-06 DIAGNOSIS — R2689 Other abnormalities of gait and mobility: Secondary | ICD-10-CM | POA: Diagnosis not present

## 2024-03-06 DIAGNOSIS — R296 Repeated falls: Secondary | ICD-10-CM | POA: Diagnosis not present

## 2024-03-06 DIAGNOSIS — M6281 Muscle weakness (generalized): Secondary | ICD-10-CM | POA: Diagnosis not present

## 2024-03-09 DIAGNOSIS — Z79899 Other long term (current) drug therapy: Secondary | ICD-10-CM | POA: Diagnosis not present

## 2024-03-09 DIAGNOSIS — R296 Repeated falls: Secondary | ICD-10-CM | POA: Diagnosis not present

## 2024-03-09 DIAGNOSIS — R2689 Other abnormalities of gait and mobility: Secondary | ICD-10-CM | POA: Diagnosis not present

## 2024-03-09 DIAGNOSIS — R269 Unspecified abnormalities of gait and mobility: Secondary | ICD-10-CM | POA: Diagnosis not present

## 2024-03-09 DIAGNOSIS — I129 Hypertensive chronic kidney disease with stage 1 through stage 4 chronic kidney disease, or unspecified chronic kidney disease: Secondary | ICD-10-CM | POA: Diagnosis not present

## 2024-03-09 DIAGNOSIS — K219 Gastro-esophageal reflux disease without esophagitis: Secondary | ICD-10-CM | POA: Diagnosis not present

## 2024-03-09 DIAGNOSIS — I10 Essential (primary) hypertension: Secondary | ICD-10-CM | POA: Diagnosis not present

## 2024-03-09 DIAGNOSIS — N3281 Overactive bladder: Secondary | ICD-10-CM | POA: Diagnosis not present

## 2024-03-09 DIAGNOSIS — M6281 Muscle weakness (generalized): Secondary | ICD-10-CM | POA: Diagnosis not present

## 2024-03-10 ENCOUNTER — Other Ambulatory Visit: Payer: Self-pay | Admitting: Licensed Clinical Social Worker

## 2024-03-10 ENCOUNTER — Telehealth: Admitting: *Deleted

## 2024-03-10 NOTE — Patient Outreach (Signed)
 Complex Care Management   Visit Note  03/10/2024  Name:  Antonio Serrano MRN: [id number] DOB: 11-26-1945  Situation: Referral received for Complex Care Management related to Diabetes with Complications and Chronic Kidney Disease I obtained verbal consent from Patient.  Visit completed with pt's daughter, Antonio Serrano  on the phone  Background:   Past Medical History:  Diagnosis Date   Atherosclerosis of coronary artery    On chest CT->calcif in LAD and RCA   Cerebrovascular disease    03/2023 MRI brain showed chronic microvasc isch changes and small remote infarcts   Chronic low back pain    MR 11/2021 showed L4-5 foraminal stenosis--> plan per Ortho was to do L4-5 injection   Chronic prostatitis    Very mild elevation of PSA (>4), referred to Urol where PSA repeat was 1.0 on 06/16/13.  Annual PSA repeat is all that is needed now per urologist.  Most recent 07/2015 was 0.45.   Chronic renal insufficiency, stage III (moderate) (HCC)    Baseline GFR as of 2019= 50s.     Closed right hip fracture (HCC) 03/2021   THA   Debilitated    Diabetes mellitus with complication (HCC) Dx'd fall 2011   Gastritis and gastroduodenitis 06/16/2023   HTN (hypertension)    Renal/aortic doppler u/s normal 06/2010   Hypercalcemia    Hyperlipidemia 2016   Statin intolerant--myalgias.  Pt refuses any further trial of statin as of 2018.   Nonspecific abnormal electrocardiogram (ECG) (EKG) Fall 2011   TWI in inferior leads: myocardial perfusion scan neg and echo normal 07/2010   PAF (paroxysmal atrial fibrillation) (HCC)    2024   Pseudomonas aeruginosa colonization    12/2021 urine clx   SBO (small bowel obstruction) (HCC) 10/29/2023   Closed-loop obstruction due to adhesions from chronic appendicitis--> resolved status post appendectomy   TIA (transient ischemic attack)    12/2021- CT angio head/neck no high grade stenosis.  MR showed old lacunar infarcts corona radiata, basal ganglia, thalamus.  Echo normal.    Tobacco dependence    Unstable gait    UTI (lower urinary tract infection)    Feb 2012 (klebsiella--dx at [facility]); 03/2013 e coli.     Assessment: Patient Reported Symptoms:  Cognitive Cognitive Status: Able to follow simple commands, Normal speech and language skills Cognitive/Intellectual Conditions Management [RPT]: None reported or documented in medical history or problem list      Neurological Neurological Review of Symptoms: No symptoms reported    HEENT HEENT Symptoms Reported: No symptoms reported      Cardiovascular Cardiovascular Symptoms Reported: No symptoms reported    Respiratory Respiratory Symptoms Reported: No symptoms reported    Endocrine Patient reports the following symptoms related to hypoglycemia or hyperglycemia : No symptoms reported Is patient diabetic?: Yes Endocrine Conditions: Diabetes Endocrine Management Strategies: Medication therapy, Routine screening  Gastrointestinal Gastrointestinal Symptoms Reported: No symptoms reported      Genitourinary Genitourinary Symptoms Reported: No symptoms reported Genitourinary Conditions: Chronic kidney disease Genitourinary Management Strategies: Medication therapy  Integumentary Integumentary Symptoms Reported: No symptoms reported    Musculoskeletal Musculoskelatal Symptoms Reviewed: Difficulty walking, Unsteady gait, Weakness Musculoskeletal Conditions: Mobility limited Musculoskeletal Management Strategies: Routine screening      Psychosocial Psychosocial Symptoms Reported: No symptoms reported Additional Psychological Details: Pt has been placed at [facility] for approx three weeks. Family reports it is an adjustment; however, pt is doing well. They continue to be supportive and visit routinely. Strategies discssed to assist with processing pt's recent placement  and assisting with ongoing support Behavioral Management Strategies: Coping strategies, Support system Major Change/Loss/Stressor/Fears  (CP): Medical condition, self, Environment Quality of Family Relationships: helpful, involved, supportive      02/11/2024    3:55 PM  Depression screen PHQ 2/9  Decreased Interest 0  Down, Depressed, Hopeless 0  PHQ - 2 Score 0    There were no vitals filed for this visit.  Medications Reviewed Today   Medications were not reviewed in this encounter     Recommendation:   Continue Current Plan of Care  Follow Up Plan:   Closing From:  Complex Care Management  Alease Hunter, LCSW   [facility], [facility] Clinical Social Worker Direct Dial: [phone]  Fax: [phone] Website: [email or web address] 3:59 PM

## 2024-03-10 NOTE — Patient Instructions (Signed)
 Visit Information  Thank you for taking time to visit with me today. Please don't hesitate to contact me if I can be of assistance to you before our next scheduled appointment.  Closing From: Complex Care Management.  Please call the care guide team at [phone] if you need to cancel, schedule, or reschedule an appointment.   Please call the Suicide and Crisis Lifeline: 988 call the [place]  National Suicide Prevention Lifeline: 778-113-6382 or TTY: 579-117-8379 TTY 220 746 9165) to talk to a trained counselor call 1-800-273-TALK (toll free, 24 hour hotline) call 911 if you are experiencing a Mental Health or Behavioral Health Crisis or need someone to talk to.  Alease Hunter, LCSW [place]  [facility], [facility] Clinical Social Worker Direct Dial: [phone]  Fax: [phone] Website: [email or web address] 3:59 PM

## 2024-03-12 DIAGNOSIS — I129 Hypertensive chronic kidney disease with stage 1 through stage 4 chronic kidney disease, or unspecified chronic kidney disease: Secondary | ICD-10-CM | POA: Diagnosis not present

## 2024-03-12 DIAGNOSIS — R296 Repeated falls: Secondary | ICD-10-CM | POA: Diagnosis not present

## 2024-03-12 DIAGNOSIS — M6281 Muscle weakness (generalized): Secondary | ICD-10-CM | POA: Diagnosis not present

## 2024-03-12 DIAGNOSIS — R269 Unspecified abnormalities of gait and mobility: Secondary | ICD-10-CM | POA: Diagnosis not present

## 2024-03-12 DIAGNOSIS — R2689 Other abnormalities of gait and mobility: Secondary | ICD-10-CM | POA: Diagnosis not present

## 2024-03-13 ENCOUNTER — Other Ambulatory Visit: Payer: Self-pay | Admitting: *Deleted

## 2024-03-13 DIAGNOSIS — I129 Hypertensive chronic kidney disease with stage 1 through stage 4 chronic kidney disease, or unspecified chronic kidney disease: Secondary | ICD-10-CM | POA: Diagnosis not present

## 2024-03-13 DIAGNOSIS — R269 Unspecified abnormalities of gait and mobility: Secondary | ICD-10-CM | POA: Diagnosis not present

## 2024-03-13 DIAGNOSIS — R2689 Other abnormalities of gait and mobility: Secondary | ICD-10-CM | POA: Diagnosis not present

## 2024-03-13 DIAGNOSIS — R296 Repeated falls: Secondary | ICD-10-CM | POA: Diagnosis not present

## 2024-03-13 DIAGNOSIS — M6281 Muscle weakness (generalized): Secondary | ICD-10-CM | POA: Diagnosis not present

## 2024-03-13 NOTE — Patient Instructions (Signed)
 Visit Information  Thank you for taking time to visit with me today. Please don't hesitate to contact me if I can be of assistance to you before our next scheduled appointment.  Your next care management appointment is no further scheduled appointments.     Patient has met all care management goals. Care Management case will be closed. Patient has been provided contact information should new needs arise.   Please call the care guide team at [phone] if you need to cancel, schedule, or reschedule an appointment.   Please call the Suicide and Crisis Lifeline: 988 call the [place]  National Suicide Prevention Lifeline: (802)300-4900 or TTY: 458-305-9423 TTY 575-093-1553) to talk to a trained counselor call 1-800-273-TALK (toll free, 24 hour hotline) call the [facility]: [phone] call 911 if you are experiencing a Mental Health or Behavioral Health Crisis or need someone to talk to.  Gilberto Labella, MSN, RN [facility], [facility] RN Care Manager Direct Dial: [phone] Fax: [phone]

## 2024-03-13 NOTE — Patient Outreach (Signed)
 Complex Care Management   Visit Note  03/13/2024  Name:  Antonio Serrano MRN: [id number] DOB: 11-12-1945  Situation: Referral received for Complex Care Management related to Diabetes with Complications and HTN I obtained verbal consent from Caregiver.  Visit completed with Wife   on the phone  Background:   Past Medical History:  Diagnosis Date   Atherosclerosis of coronary artery    On chest CT->calcif in LAD and RCA   Cerebrovascular disease    03/2023 MRI brain showed chronic microvasc isch changes and small remote infarcts   Chronic low back pain    MR 11/2021 showed L4-5 foraminal stenosis--> plan per Ortho was to do L4-5 injection   Chronic prostatitis    Very mild elevation of PSA (>4), referred to Urol where PSA repeat was 1.0 on 06/16/13.  Annual PSA repeat is all that is needed now per urologist.  Most recent 07/2015 was 0.45.   Chronic renal insufficiency, stage III (moderate) (HCC)    Baseline GFR as of 2019= 50s.     Closed right hip fracture (HCC) 03/2021   THA   Debilitated    Diabetes mellitus with complication (HCC) Dx'd fall 2011   Gastritis and gastroduodenitis 06/16/2023   HTN (hypertension)    Renal/aortic doppler u/s normal 06/2010   Hypercalcemia    Hyperlipidemia 2016   Statin intolerant--myalgias.  Pt refuses any further trial of statin as of 2018.   Nonspecific abnormal electrocardiogram (ECG) (EKG) Fall 2011   TWI in inferior leads: myocardial perfusion scan neg and echo normal 07/2010   PAF (paroxysmal atrial fibrillation) (HCC)    2024   Pseudomonas aeruginosa colonization    12/2021 urine clx   SBO (small bowel obstruction) (HCC) 10/29/2023   Closed-loop obstruction due to adhesions from chronic appendicitis--> resolved status post appendectomy   TIA (transient ischemic attack)    12/2021- CT angio head/neck no high grade stenosis.  MR showed old lacunar infarcts corona radiata, basal ganglia, thalamus.  Echo normal.   Tobacco dependence    Unstable  gait    UTI (lower urinary tract infection)    Feb 2012 (klebsiella--dx at [facility]); 03/2013 e coli.     Assessment: Patient Reported Symptoms:  Cognitive Cognitive Status: Unable to Assess      Neurological Neurological Review of Symptoms: Not assessed    HEENT HEENT Symptoms Reported: Not assessed      Cardiovascular Cardiovascular Symptoms Reported: Not assessed    Respiratory Respiratory Symptoms Reported: Not assesed    Endocrine Patient reports the following symptoms related to hypoglycemia or hyperglycemia : Not assessed    Gastrointestinal Gastrointestinal Symptoms Reported: Not assessed      Genitourinary Genitourinary Symptoms Reported: Not assessed    Integumentary Integumentary Symptoms Reported: Not assessed    Musculoskeletal Musculoskelatal Symptoms Reviewed: Not assessed        Psychosocial Psychosocial Symptoms Reported: Not assessed            02/11/2024    3:55 PM  Depression screen PHQ 2/9  Decreased Interest 0  Down, Depressed, Hopeless 0  PHQ - 2 Score 0    There were no vitals filed for this visit.  Medications Reviewed Today   Medications were not reviewed in this encounter     Recommendation:   Continue Current Plan of Care  Follow Up Plan:   Closing From:  Complex Care Management: patient has been placed in Long Term Care Facility.   Gilberto Labella, MSN, RN [facility], IT trainer Dial: [phone] Fax: [phone]

## 2024-03-16 ENCOUNTER — Telehealth: Payer: Self-pay

## 2024-03-16 DIAGNOSIS — M6281 Muscle weakness (generalized): Secondary | ICD-10-CM | POA: Diagnosis not present

## 2024-03-16 DIAGNOSIS — I129 Hypertensive chronic kidney disease with stage 1 through stage 4 chronic kidney disease, or unspecified chronic kidney disease: Secondary | ICD-10-CM | POA: Diagnosis not present

## 2024-03-16 DIAGNOSIS — R296 Repeated falls: Secondary | ICD-10-CM | POA: Diagnosis not present

## 2024-03-16 DIAGNOSIS — R269 Unspecified abnormalities of gait and mobility: Secondary | ICD-10-CM | POA: Diagnosis not present

## 2024-03-16 DIAGNOSIS — R2689 Other abnormalities of gait and mobility: Secondary | ICD-10-CM | POA: Diagnosis not present

## 2024-03-17 DIAGNOSIS — R296 Repeated falls: Secondary | ICD-10-CM | POA: Diagnosis not present

## 2024-03-17 DIAGNOSIS — M6281 Muscle weakness (generalized): Secondary | ICD-10-CM | POA: Diagnosis not present

## 2024-03-17 DIAGNOSIS — R269 Unspecified abnormalities of gait and mobility: Secondary | ICD-10-CM | POA: Diagnosis not present

## 2024-03-17 DIAGNOSIS — R2689 Other abnormalities of gait and mobility: Secondary | ICD-10-CM | POA: Diagnosis not present

## 2024-03-17 DIAGNOSIS — I129 Hypertensive chronic kidney disease with stage 1 through stage 4 chronic kidney disease, or unspecified chronic kidney disease: Secondary | ICD-10-CM | POA: Diagnosis not present

## 2024-03-18 ENCOUNTER — Telehealth: Payer: Self-pay | Admitting: *Deleted

## 2024-03-18 DIAGNOSIS — R2689 Other abnormalities of gait and mobility: Secondary | ICD-10-CM | POA: Diagnosis not present

## 2024-03-18 DIAGNOSIS — M6281 Muscle weakness (generalized): Secondary | ICD-10-CM | POA: Diagnosis not present

## 2024-03-18 DIAGNOSIS — R296 Repeated falls: Secondary | ICD-10-CM | POA: Diagnosis not present

## 2024-03-18 DIAGNOSIS — R269 Unspecified abnormalities of gait and mobility: Secondary | ICD-10-CM | POA: Diagnosis not present

## 2024-03-18 DIAGNOSIS — I129 Hypertensive chronic kidney disease with stage 1 through stage 4 chronic kidney disease, or unspecified chronic kidney disease: Secondary | ICD-10-CM | POA: Diagnosis not present

## 2024-03-18 NOTE — Patient Outreach (Signed)
  Care Management   Follow Up Note   03/18/2024 Name: Antonio Serrano MRN: [id number] DOB: [birth year]   Referred by: Shelvia Dick, MD Reason for referral : Complex Care Management (RN Care Manager)   Received incoming call from [facility] with [facility] asking about patients placement. She stated she spoke with spouse and she reported that he was at [facility] and [facility] is calling to verify if its for long term or acute rehab. RNCM advised that spouse stated on previous outreach that patient was being placed for long term care needs.  Follow Up Plan: No further follow up required:    [place] Lax, BSN RN [facility], [facility] RN Care Manager Direct Dial: [phone]  Fax: [phone]

## 2024-03-19 DIAGNOSIS — R269 Unspecified abnormalities of gait and mobility: Secondary | ICD-10-CM | POA: Diagnosis not present

## 2024-03-19 DIAGNOSIS — R2689 Other abnormalities of gait and mobility: Secondary | ICD-10-CM | POA: Diagnosis not present

## 2024-03-19 DIAGNOSIS — R296 Repeated falls: Secondary | ICD-10-CM | POA: Diagnosis not present

## 2024-03-19 DIAGNOSIS — M6281 Muscle weakness (generalized): Secondary | ICD-10-CM | POA: Diagnosis not present

## 2024-03-19 DIAGNOSIS — I129 Hypertensive chronic kidney disease with stage 1 through stage 4 chronic kidney disease, or unspecified chronic kidney disease: Secondary | ICD-10-CM | POA: Diagnosis not present

## 2024-03-20 DIAGNOSIS — R2689 Other abnormalities of gait and mobility: Secondary | ICD-10-CM | POA: Diagnosis not present

## 2024-03-20 DIAGNOSIS — R296 Repeated falls: Secondary | ICD-10-CM | POA: Diagnosis not present

## 2024-03-20 DIAGNOSIS — I129 Hypertensive chronic kidney disease with stage 1 through stage 4 chronic kidney disease, or unspecified chronic kidney disease: Secondary | ICD-10-CM | POA: Diagnosis not present

## 2024-03-20 DIAGNOSIS — R269 Unspecified abnormalities of gait and mobility: Secondary | ICD-10-CM | POA: Diagnosis not present

## 2024-03-20 DIAGNOSIS — M6281 Muscle weakness (generalized): Secondary | ICD-10-CM | POA: Diagnosis not present

## 2024-03-23 DIAGNOSIS — R269 Unspecified abnormalities of gait and mobility: Secondary | ICD-10-CM | POA: Diagnosis not present

## 2024-03-23 DIAGNOSIS — R296 Repeated falls: Secondary | ICD-10-CM | POA: Diagnosis not present

## 2024-03-23 DIAGNOSIS — M6281 Muscle weakness (generalized): Secondary | ICD-10-CM | POA: Diagnosis not present

## 2024-03-23 DIAGNOSIS — I129 Hypertensive chronic kidney disease with stage 1 through stage 4 chronic kidney disease, or unspecified chronic kidney disease: Secondary | ICD-10-CM | POA: Diagnosis not present

## 2024-03-23 DIAGNOSIS — R2689 Other abnormalities of gait and mobility: Secondary | ICD-10-CM | POA: Diagnosis not present

## 2024-03-24 DIAGNOSIS — R2689 Other abnormalities of gait and mobility: Secondary | ICD-10-CM | POA: Diagnosis not present

## 2024-03-24 DIAGNOSIS — R296 Repeated falls: Secondary | ICD-10-CM | POA: Diagnosis not present

## 2024-03-24 DIAGNOSIS — R269 Unspecified abnormalities of gait and mobility: Secondary | ICD-10-CM | POA: Diagnosis not present

## 2024-03-24 DIAGNOSIS — I129 Hypertensive chronic kidney disease with stage 1 through stage 4 chronic kidney disease, or unspecified chronic kidney disease: Secondary | ICD-10-CM | POA: Diagnosis not present

## 2024-03-24 DIAGNOSIS — M6281 Muscle weakness (generalized): Secondary | ICD-10-CM | POA: Diagnosis not present

## 2024-03-25 DIAGNOSIS — R296 Repeated falls: Secondary | ICD-10-CM | POA: Diagnosis not present

## 2024-03-25 DIAGNOSIS — M6281 Muscle weakness (generalized): Secondary | ICD-10-CM | POA: Diagnosis not present

## 2024-03-25 DIAGNOSIS — R2689 Other abnormalities of gait and mobility: Secondary | ICD-10-CM | POA: Diagnosis not present

## 2024-03-25 DIAGNOSIS — R269 Unspecified abnormalities of gait and mobility: Secondary | ICD-10-CM | POA: Diagnosis not present

## 2024-03-25 DIAGNOSIS — I129 Hypertensive chronic kidney disease with stage 1 through stage 4 chronic kidney disease, or unspecified chronic kidney disease: Secondary | ICD-10-CM | POA: Diagnosis not present

## 2024-03-26 DIAGNOSIS — M6281 Muscle weakness (generalized): Secondary | ICD-10-CM | POA: Diagnosis not present

## 2024-03-26 DIAGNOSIS — R269 Unspecified abnormalities of gait and mobility: Secondary | ICD-10-CM | POA: Diagnosis not present

## 2024-03-26 DIAGNOSIS — R2689 Other abnormalities of gait and mobility: Secondary | ICD-10-CM | POA: Diagnosis not present

## 2024-03-26 DIAGNOSIS — I129 Hypertensive chronic kidney disease with stage 1 through stage 4 chronic kidney disease, or unspecified chronic kidney disease: Secondary | ICD-10-CM | POA: Diagnosis not present

## 2024-03-26 DIAGNOSIS — R296 Repeated falls: Secondary | ICD-10-CM | POA: Diagnosis not present

## 2024-03-27 DIAGNOSIS — M6281 Muscle weakness (generalized): Secondary | ICD-10-CM | POA: Diagnosis not present

## 2024-03-27 DIAGNOSIS — R2689 Other abnormalities of gait and mobility: Secondary | ICD-10-CM | POA: Diagnosis not present

## 2024-03-27 DIAGNOSIS — R269 Unspecified abnormalities of gait and mobility: Secondary | ICD-10-CM | POA: Diagnosis not present

## 2024-03-27 DIAGNOSIS — I129 Hypertensive chronic kidney disease with stage 1 through stage 4 chronic kidney disease, or unspecified chronic kidney disease: Secondary | ICD-10-CM | POA: Diagnosis not present

## 2024-03-27 DIAGNOSIS — R296 Repeated falls: Secondary | ICD-10-CM | POA: Diagnosis not present

## 2024-04-15 DIAGNOSIS — I679 Cerebrovascular disease, unspecified: Secondary | ICD-10-CM | POA: Diagnosis not present

## 2024-04-15 DIAGNOSIS — K219 Gastro-esophageal reflux disease without esophagitis: Secondary | ICD-10-CM | POA: Diagnosis not present

## 2024-04-15 DIAGNOSIS — N1831 Chronic kidney disease, stage 3a: Secondary | ICD-10-CM | POA: Diagnosis not present

## 2024-04-15 DIAGNOSIS — I10 Essential (primary) hypertension: Secondary | ICD-10-CM | POA: Diagnosis not present

## 2024-04-22 DIAGNOSIS — I10 Essential (primary) hypertension: Secondary | ICD-10-CM | POA: Diagnosis not present

## 2024-04-22 DIAGNOSIS — N1831 Chronic kidney disease, stage 3a: Secondary | ICD-10-CM | POA: Diagnosis not present

## 2024-04-22 DIAGNOSIS — E871 Hypo-osmolality and hyponatremia: Secondary | ICD-10-CM | POA: Diagnosis not present

## 2024-04-22 DIAGNOSIS — I480 Paroxysmal atrial fibrillation: Secondary | ICD-10-CM | POA: Diagnosis not present

## 2024-05-12 DIAGNOSIS — K080 Exfoliation of teeth due to systemic causes: Secondary | ICD-10-CM | POA: Diagnosis not present

## 2024-05-13 DIAGNOSIS — K080 Exfoliation of teeth due to systemic causes: Secondary | ICD-10-CM | POA: Diagnosis not present

## 2024-05-20 DIAGNOSIS — N1831 Chronic kidney disease, stage 3a: Secondary | ICD-10-CM | POA: Diagnosis not present

## 2024-05-20 DIAGNOSIS — R2689 Other abnormalities of gait and mobility: Secondary | ICD-10-CM | POA: Diagnosis not present

## 2024-05-20 DIAGNOSIS — R296 Repeated falls: Secondary | ICD-10-CM | POA: Diagnosis not present

## 2024-05-20 DIAGNOSIS — I129 Hypertensive chronic kidney disease with stage 1 through stage 4 chronic kidney disease, or unspecified chronic kidney disease: Secondary | ICD-10-CM | POA: Diagnosis not present

## 2024-05-20 DIAGNOSIS — R269 Unspecified abnormalities of gait and mobility: Secondary | ICD-10-CM | POA: Diagnosis not present

## 2024-05-20 DIAGNOSIS — M6281 Muscle weakness (generalized): Secondary | ICD-10-CM | POA: Diagnosis not present

## 2024-05-20 DIAGNOSIS — I480 Paroxysmal atrial fibrillation: Secondary | ICD-10-CM | POA: Diagnosis not present

## 2024-05-20 DIAGNOSIS — I10 Essential (primary) hypertension: Secondary | ICD-10-CM | POA: Diagnosis not present

## 2024-05-20 DIAGNOSIS — Z8673 Personal history of transient ischemic attack (TIA), and cerebral infarction without residual deficits: Secondary | ICD-10-CM | POA: Diagnosis not present

## 2024-05-21 DIAGNOSIS — I129 Hypertensive chronic kidney disease with stage 1 through stage 4 chronic kidney disease, or unspecified chronic kidney disease: Secondary | ICD-10-CM | POA: Diagnosis not present

## 2024-05-21 DIAGNOSIS — R2689 Other abnormalities of gait and mobility: Secondary | ICD-10-CM | POA: Diagnosis not present

## 2024-05-21 DIAGNOSIS — M6281 Muscle weakness (generalized): Secondary | ICD-10-CM | POA: Diagnosis not present

## 2024-05-21 DIAGNOSIS — R269 Unspecified abnormalities of gait and mobility: Secondary | ICD-10-CM | POA: Diagnosis not present

## 2024-05-21 DIAGNOSIS — R296 Repeated falls: Secondary | ICD-10-CM | POA: Diagnosis not present

## 2024-05-22 DIAGNOSIS — R269 Unspecified abnormalities of gait and mobility: Secondary | ICD-10-CM | POA: Diagnosis not present

## 2024-05-22 DIAGNOSIS — R2689 Other abnormalities of gait and mobility: Secondary | ICD-10-CM | POA: Diagnosis not present

## 2024-05-22 DIAGNOSIS — R296 Repeated falls: Secondary | ICD-10-CM | POA: Diagnosis not present

## 2024-05-22 DIAGNOSIS — M6281 Muscle weakness (generalized): Secondary | ICD-10-CM | POA: Diagnosis not present

## 2024-05-22 DIAGNOSIS — I129 Hypertensive chronic kidney disease with stage 1 through stage 4 chronic kidney disease, or unspecified chronic kidney disease: Secondary | ICD-10-CM | POA: Diagnosis not present

## 2024-05-25 DIAGNOSIS — R2689 Other abnormalities of gait and mobility: Secondary | ICD-10-CM | POA: Diagnosis not present

## 2024-05-25 DIAGNOSIS — R269 Unspecified abnormalities of gait and mobility: Secondary | ICD-10-CM | POA: Diagnosis not present

## 2024-05-25 DIAGNOSIS — R296 Repeated falls: Secondary | ICD-10-CM | POA: Diagnosis not present

## 2024-05-25 DIAGNOSIS — I129 Hypertensive chronic kidney disease with stage 1 through stage 4 chronic kidney disease, or unspecified chronic kidney disease: Secondary | ICD-10-CM | POA: Diagnosis not present

## 2024-05-25 DIAGNOSIS — M6281 Muscle weakness (generalized): Secondary | ICD-10-CM | POA: Diagnosis not present

## 2024-05-26 DIAGNOSIS — R296 Repeated falls: Secondary | ICD-10-CM | POA: Diagnosis not present

## 2024-05-26 DIAGNOSIS — M6281 Muscle weakness (generalized): Secondary | ICD-10-CM | POA: Diagnosis not present

## 2024-05-26 DIAGNOSIS — R269 Unspecified abnormalities of gait and mobility: Secondary | ICD-10-CM | POA: Diagnosis not present

## 2024-05-26 DIAGNOSIS — R2689 Other abnormalities of gait and mobility: Secondary | ICD-10-CM | POA: Diagnosis not present

## 2024-05-26 DIAGNOSIS — I129 Hypertensive chronic kidney disease with stage 1 through stage 4 chronic kidney disease, or unspecified chronic kidney disease: Secondary | ICD-10-CM | POA: Diagnosis not present

## 2024-05-27 DIAGNOSIS — R2689 Other abnormalities of gait and mobility: Secondary | ICD-10-CM | POA: Diagnosis not present

## 2024-05-27 DIAGNOSIS — M6281 Muscle weakness (generalized): Secondary | ICD-10-CM | POA: Diagnosis not present

## 2024-05-27 DIAGNOSIS — R296 Repeated falls: Secondary | ICD-10-CM | POA: Diagnosis not present

## 2024-05-27 DIAGNOSIS — R269 Unspecified abnormalities of gait and mobility: Secondary | ICD-10-CM | POA: Diagnosis not present

## 2024-05-27 DIAGNOSIS — I129 Hypertensive chronic kidney disease with stage 1 through stage 4 chronic kidney disease, or unspecified chronic kidney disease: Secondary | ICD-10-CM | POA: Diagnosis not present

## 2024-05-28 DIAGNOSIS — R2689 Other abnormalities of gait and mobility: Secondary | ICD-10-CM | POA: Diagnosis not present

## 2024-05-28 DIAGNOSIS — M6281 Muscle weakness (generalized): Secondary | ICD-10-CM | POA: Diagnosis not present

## 2024-05-28 DIAGNOSIS — R296 Repeated falls: Secondary | ICD-10-CM | POA: Diagnosis not present

## 2024-05-28 DIAGNOSIS — I129 Hypertensive chronic kidney disease with stage 1 through stage 4 chronic kidney disease, or unspecified chronic kidney disease: Secondary | ICD-10-CM | POA: Diagnosis not present

## 2024-05-28 DIAGNOSIS — R269 Unspecified abnormalities of gait and mobility: Secondary | ICD-10-CM | POA: Diagnosis not present

## 2024-05-30 DIAGNOSIS — R269 Unspecified abnormalities of gait and mobility: Secondary | ICD-10-CM | POA: Diagnosis not present

## 2024-05-30 DIAGNOSIS — M6281 Muscle weakness (generalized): Secondary | ICD-10-CM | POA: Diagnosis not present

## 2024-05-30 DIAGNOSIS — R2689 Other abnormalities of gait and mobility: Secondary | ICD-10-CM | POA: Diagnosis not present

## 2024-05-30 DIAGNOSIS — I129 Hypertensive chronic kidney disease with stage 1 through stage 4 chronic kidney disease, or unspecified chronic kidney disease: Secondary | ICD-10-CM | POA: Diagnosis not present

## 2024-05-30 DIAGNOSIS — R296 Repeated falls: Secondary | ICD-10-CM | POA: Diagnosis not present

## 2024-05-31 DIAGNOSIS — R269 Unspecified abnormalities of gait and mobility: Secondary | ICD-10-CM | POA: Diagnosis not present

## 2024-05-31 DIAGNOSIS — M6281 Muscle weakness (generalized): Secondary | ICD-10-CM | POA: Diagnosis not present

## 2024-05-31 DIAGNOSIS — R296 Repeated falls: Secondary | ICD-10-CM | POA: Diagnosis not present

## 2024-05-31 DIAGNOSIS — R2689 Other abnormalities of gait and mobility: Secondary | ICD-10-CM | POA: Diagnosis not present

## 2024-05-31 DIAGNOSIS — I129 Hypertensive chronic kidney disease with stage 1 through stage 4 chronic kidney disease, or unspecified chronic kidney disease: Secondary | ICD-10-CM | POA: Diagnosis not present

## 2024-06-02 DIAGNOSIS — I129 Hypertensive chronic kidney disease with stage 1 through stage 4 chronic kidney disease, or unspecified chronic kidney disease: Secondary | ICD-10-CM | POA: Diagnosis not present

## 2024-06-02 DIAGNOSIS — R296 Repeated falls: Secondary | ICD-10-CM | POA: Diagnosis not present

## 2024-06-02 DIAGNOSIS — R2689 Other abnormalities of gait and mobility: Secondary | ICD-10-CM | POA: Diagnosis not present

## 2024-06-02 DIAGNOSIS — R269 Unspecified abnormalities of gait and mobility: Secondary | ICD-10-CM | POA: Diagnosis not present

## 2024-06-02 DIAGNOSIS — M6281 Muscle weakness (generalized): Secondary | ICD-10-CM | POA: Diagnosis not present

## 2024-06-03 DIAGNOSIS — R269 Unspecified abnormalities of gait and mobility: Secondary | ICD-10-CM | POA: Diagnosis not present

## 2024-06-03 DIAGNOSIS — R296 Repeated falls: Secondary | ICD-10-CM | POA: Diagnosis not present

## 2024-06-03 DIAGNOSIS — I129 Hypertensive chronic kidney disease with stage 1 through stage 4 chronic kidney disease, or unspecified chronic kidney disease: Secondary | ICD-10-CM | POA: Diagnosis not present

## 2024-06-03 DIAGNOSIS — M6281 Muscle weakness (generalized): Secondary | ICD-10-CM | POA: Diagnosis not present

## 2024-06-03 DIAGNOSIS — R2689 Other abnormalities of gait and mobility: Secondary | ICD-10-CM | POA: Diagnosis not present

## 2024-06-04 DIAGNOSIS — R2689 Other abnormalities of gait and mobility: Secondary | ICD-10-CM | POA: Diagnosis not present

## 2024-06-04 DIAGNOSIS — I129 Hypertensive chronic kidney disease with stage 1 through stage 4 chronic kidney disease, or unspecified chronic kidney disease: Secondary | ICD-10-CM | POA: Diagnosis not present

## 2024-06-04 DIAGNOSIS — R296 Repeated falls: Secondary | ICD-10-CM | POA: Diagnosis not present

## 2024-06-04 DIAGNOSIS — M6281 Muscle weakness (generalized): Secondary | ICD-10-CM | POA: Diagnosis not present

## 2024-06-04 DIAGNOSIS — R269 Unspecified abnormalities of gait and mobility: Secondary | ICD-10-CM | POA: Diagnosis not present

## 2024-06-05 DIAGNOSIS — M6281 Muscle weakness (generalized): Secondary | ICD-10-CM | POA: Diagnosis not present

## 2024-06-05 DIAGNOSIS — R2689 Other abnormalities of gait and mobility: Secondary | ICD-10-CM | POA: Diagnosis not present

## 2024-06-05 DIAGNOSIS — R269 Unspecified abnormalities of gait and mobility: Secondary | ICD-10-CM | POA: Diagnosis not present

## 2024-06-05 DIAGNOSIS — R296 Repeated falls: Secondary | ICD-10-CM | POA: Diagnosis not present

## 2024-06-08 DIAGNOSIS — R2689 Other abnormalities of gait and mobility: Secondary | ICD-10-CM | POA: Diagnosis not present

## 2024-06-08 DIAGNOSIS — M6281 Muscle weakness (generalized): Secondary | ICD-10-CM | POA: Diagnosis not present

## 2024-06-08 DIAGNOSIS — R296 Repeated falls: Secondary | ICD-10-CM | POA: Diagnosis not present

## 2024-06-08 DIAGNOSIS — I129 Hypertensive chronic kidney disease with stage 1 through stage 4 chronic kidney disease, or unspecified chronic kidney disease: Secondary | ICD-10-CM | POA: Diagnosis not present

## 2024-06-08 DIAGNOSIS — R269 Unspecified abnormalities of gait and mobility: Secondary | ICD-10-CM | POA: Diagnosis not present

## 2024-06-09 DIAGNOSIS — R269 Unspecified abnormalities of gait and mobility: Secondary | ICD-10-CM | POA: Diagnosis not present

## 2024-06-09 DIAGNOSIS — R2689 Other abnormalities of gait and mobility: Secondary | ICD-10-CM | POA: Diagnosis not present

## 2024-06-09 DIAGNOSIS — R296 Repeated falls: Secondary | ICD-10-CM | POA: Diagnosis not present

## 2024-06-09 DIAGNOSIS — M6281 Muscle weakness (generalized): Secondary | ICD-10-CM | POA: Diagnosis not present

## 2024-06-10 DIAGNOSIS — R2689 Other abnormalities of gait and mobility: Secondary | ICD-10-CM | POA: Diagnosis not present

## 2024-06-10 DIAGNOSIS — M6281 Muscle weakness (generalized): Secondary | ICD-10-CM | POA: Diagnosis not present

## 2024-06-10 DIAGNOSIS — R269 Unspecified abnormalities of gait and mobility: Secondary | ICD-10-CM | POA: Diagnosis not present

## 2024-06-10 DIAGNOSIS — I129 Hypertensive chronic kidney disease with stage 1 through stage 4 chronic kidney disease, or unspecified chronic kidney disease: Secondary | ICD-10-CM | POA: Diagnosis not present

## 2024-06-10 DIAGNOSIS — R296 Repeated falls: Secondary | ICD-10-CM | POA: Diagnosis not present

## 2024-06-11 DIAGNOSIS — I129 Hypertensive chronic kidney disease with stage 1 through stage 4 chronic kidney disease, or unspecified chronic kidney disease: Secondary | ICD-10-CM | POA: Diagnosis not present

## 2024-06-11 DIAGNOSIS — R296 Repeated falls: Secondary | ICD-10-CM | POA: Diagnosis not present

## 2024-06-11 DIAGNOSIS — M6281 Muscle weakness (generalized): Secondary | ICD-10-CM | POA: Diagnosis not present

## 2024-06-11 DIAGNOSIS — R269 Unspecified abnormalities of gait and mobility: Secondary | ICD-10-CM | POA: Diagnosis not present

## 2024-06-11 DIAGNOSIS — R2689 Other abnormalities of gait and mobility: Secondary | ICD-10-CM | POA: Diagnosis not present

## 2024-06-12 DIAGNOSIS — M6281 Muscle weakness (generalized): Secondary | ICD-10-CM | POA: Diagnosis not present

## 2024-06-12 DIAGNOSIS — R2689 Other abnormalities of gait and mobility: Secondary | ICD-10-CM | POA: Diagnosis not present

## 2024-06-12 DIAGNOSIS — R296 Repeated falls: Secondary | ICD-10-CM | POA: Diagnosis not present

## 2024-06-12 DIAGNOSIS — R269 Unspecified abnormalities of gait and mobility: Secondary | ICD-10-CM | POA: Diagnosis not present

## 2024-06-12 DIAGNOSIS — I129 Hypertensive chronic kidney disease with stage 1 through stage 4 chronic kidney disease, or unspecified chronic kidney disease: Secondary | ICD-10-CM | POA: Diagnosis not present

## 2024-06-14 DIAGNOSIS — R296 Repeated falls: Secondary | ICD-10-CM | POA: Diagnosis not present

## 2024-06-14 DIAGNOSIS — R2689 Other abnormalities of gait and mobility: Secondary | ICD-10-CM | POA: Diagnosis not present

## 2024-06-14 DIAGNOSIS — M6281 Muscle weakness (generalized): Secondary | ICD-10-CM | POA: Diagnosis not present

## 2024-06-14 DIAGNOSIS — R269 Unspecified abnormalities of gait and mobility: Secondary | ICD-10-CM | POA: Diagnosis not present

## 2024-06-15 DIAGNOSIS — R269 Unspecified abnormalities of gait and mobility: Secondary | ICD-10-CM | POA: Diagnosis not present

## 2024-06-15 DIAGNOSIS — R296 Repeated falls: Secondary | ICD-10-CM | POA: Diagnosis not present

## 2024-06-15 DIAGNOSIS — M6281 Muscle weakness (generalized): Secondary | ICD-10-CM | POA: Diagnosis not present

## 2024-06-15 DIAGNOSIS — R2689 Other abnormalities of gait and mobility: Secondary | ICD-10-CM | POA: Diagnosis not present

## 2024-06-16 DIAGNOSIS — I480 Paroxysmal atrial fibrillation: Secondary | ICD-10-CM | POA: Diagnosis not present

## 2024-06-16 DIAGNOSIS — I679 Cerebrovascular disease, unspecified: Secondary | ICD-10-CM | POA: Diagnosis not present

## 2024-06-16 DIAGNOSIS — I129 Hypertensive chronic kidney disease with stage 1 through stage 4 chronic kidney disease, or unspecified chronic kidney disease: Secondary | ICD-10-CM | POA: Diagnosis not present

## 2024-06-16 DIAGNOSIS — I2510 Atherosclerotic heart disease of native coronary artery without angina pectoris: Secondary | ICD-10-CM | POA: Diagnosis not present

## 2024-06-16 DIAGNOSIS — R269 Unspecified abnormalities of gait and mobility: Secondary | ICD-10-CM | POA: Diagnosis not present

## 2024-06-16 DIAGNOSIS — R296 Repeated falls: Secondary | ICD-10-CM | POA: Diagnosis not present

## 2024-06-16 DIAGNOSIS — R2689 Other abnormalities of gait and mobility: Secondary | ICD-10-CM | POA: Diagnosis not present

## 2024-06-16 DIAGNOSIS — M6281 Muscle weakness (generalized): Secondary | ICD-10-CM | POA: Diagnosis not present

## 2024-06-16 DIAGNOSIS — F17210 Nicotine dependence, cigarettes, uncomplicated: Secondary | ICD-10-CM | POA: Diagnosis not present

## 2024-06-17 DIAGNOSIS — R296 Repeated falls: Secondary | ICD-10-CM | POA: Diagnosis not present

## 2024-06-17 DIAGNOSIS — R269 Unspecified abnormalities of gait and mobility: Secondary | ICD-10-CM | POA: Diagnosis not present

## 2024-06-17 DIAGNOSIS — M6281 Muscle weakness (generalized): Secondary | ICD-10-CM | POA: Diagnosis not present

## 2024-06-17 DIAGNOSIS — R2689 Other abnormalities of gait and mobility: Secondary | ICD-10-CM | POA: Diagnosis not present

## 2024-06-18 DIAGNOSIS — Z8673 Personal history of transient ischemic attack (TIA), and cerebral infarction without residual deficits: Secondary | ICD-10-CM | POA: Diagnosis not present

## 2024-06-18 DIAGNOSIS — I679 Cerebrovascular disease, unspecified: Secondary | ICD-10-CM | POA: Diagnosis not present

## 2024-06-18 DIAGNOSIS — I129 Hypertensive chronic kidney disease with stage 1 through stage 4 chronic kidney disease, or unspecified chronic kidney disease: Secondary | ICD-10-CM | POA: Diagnosis not present

## 2024-06-18 DIAGNOSIS — R296 Repeated falls: Secondary | ICD-10-CM | POA: Diagnosis not present

## 2024-06-18 DIAGNOSIS — R2689 Other abnormalities of gait and mobility: Secondary | ICD-10-CM | POA: Diagnosis not present

## 2024-06-18 DIAGNOSIS — I10 Essential (primary) hypertension: Secondary | ICD-10-CM | POA: Diagnosis not present

## 2024-06-18 DIAGNOSIS — R269 Unspecified abnormalities of gait and mobility: Secondary | ICD-10-CM | POA: Diagnosis not present

## 2024-06-18 DIAGNOSIS — M6281 Muscle weakness (generalized): Secondary | ICD-10-CM | POA: Diagnosis not present

## 2024-06-22 DIAGNOSIS — K219 Gastro-esophageal reflux disease without esophagitis: Secondary | ICD-10-CM | POA: Diagnosis not present

## 2024-06-22 DIAGNOSIS — Z79899 Other long term (current) drug therapy: Secondary | ICD-10-CM | POA: Diagnosis not present

## 2024-07-13 DIAGNOSIS — N3281 Overactive bladder: Secondary | ICD-10-CM | POA: Diagnosis not present

## 2024-07-13 DIAGNOSIS — M2041 Other hammer toe(s) (acquired), right foot: Secondary | ICD-10-CM | POA: Diagnosis not present

## 2024-07-13 DIAGNOSIS — R42 Dizziness and giddiness: Secondary | ICD-10-CM | POA: Diagnosis not present

## 2024-07-13 DIAGNOSIS — I739 Peripheral vascular disease, unspecified: Secondary | ICD-10-CM | POA: Diagnosis not present

## 2024-07-13 DIAGNOSIS — I10 Essential (primary) hypertension: Secondary | ICD-10-CM | POA: Diagnosis not present

## 2024-07-13 DIAGNOSIS — M2042 Other hammer toe(s) (acquired), left foot: Secondary | ICD-10-CM | POA: Diagnosis not present

## 2024-07-13 DIAGNOSIS — I480 Paroxysmal atrial fibrillation: Secondary | ICD-10-CM | POA: Diagnosis not present

## 2024-07-20 DIAGNOSIS — R42 Dizziness and giddiness: Secondary | ICD-10-CM | POA: Diagnosis not present

## 2024-07-20 DIAGNOSIS — I10 Essential (primary) hypertension: Secondary | ICD-10-CM | POA: Diagnosis not present

## 2024-07-20 DIAGNOSIS — Z961 Presence of intraocular lens: Secondary | ICD-10-CM | POA: Diagnosis not present

## 2024-07-20 DIAGNOSIS — Z79899 Other long term (current) drug therapy: Secondary | ICD-10-CM | POA: Diagnosis not present

## 2024-07-20 DIAGNOSIS — H02132 Senile ectropion of right lower eyelid: Secondary | ICD-10-CM | POA: Diagnosis not present

## 2024-07-31 DIAGNOSIS — Z79899 Other long term (current) drug therapy: Secondary | ICD-10-CM | POA: Diagnosis not present

## 2024-07-31 DIAGNOSIS — D649 Anemia, unspecified: Secondary | ICD-10-CM | POA: Diagnosis not present

## 2024-08-05 DIAGNOSIS — I679 Cerebrovascular disease, unspecified: Secondary | ICD-10-CM | POA: Diagnosis not present

## 2024-08-05 DIAGNOSIS — Z8673 Personal history of transient ischemic attack (TIA), and cerebral infarction without residual deficits: Secondary | ICD-10-CM | POA: Diagnosis not present

## 2024-08-05 DIAGNOSIS — N1831 Chronic kidney disease, stage 3a: Secondary | ICD-10-CM | POA: Diagnosis not present

## 2024-08-05 DIAGNOSIS — E559 Vitamin D deficiency, unspecified: Secondary | ICD-10-CM | POA: Diagnosis not present

## 2024-08-24 DIAGNOSIS — Z79899 Other long term (current) drug therapy: Secondary | ICD-10-CM | POA: Diagnosis not present

## 2024-08-24 DIAGNOSIS — G4700 Insomnia, unspecified: Secondary | ICD-10-CM | POA: Diagnosis not present

## 2024-11-06 ENCOUNTER — Ambulatory Visit: Admitting: Internal Medicine

## 2024-11-19 ENCOUNTER — Ambulatory Visit (HOSPITAL_BASED_OUTPATIENT_CLINIC_OR_DEPARTMENT_OTHER): Admitting: Cardiology

## 2024-11-19 ENCOUNTER — Ambulatory Visit: Admitting: Internal Medicine

## 2024-12-09 ENCOUNTER — Ambulatory Visit
